# Patient Record
Sex: Female | Born: 1950 | Race: White | Hispanic: No | Smoking: Never smoker
Health system: Southern US, Community
[De-identification: ages and names within clinical notes are randomized; demographics above are authoritative.]

## PROBLEM LIST (undated history)

## (undated) DIAGNOSIS — Z9889 Other specified postprocedural states: Secondary | ICD-10-CM

## (undated) DIAGNOSIS — E041 Nontoxic single thyroid nodule: Secondary | ICD-10-CM

## (undated) DIAGNOSIS — I10 Essential (primary) hypertension: Secondary | ICD-10-CM

## (undated) DIAGNOSIS — M545 Low back pain, unspecified: Secondary | ICD-10-CM

## (undated) DIAGNOSIS — E538 Deficiency of other specified B group vitamins: Secondary | ICD-10-CM

## (undated) DIAGNOSIS — K635 Polyp of colon: Secondary | ICD-10-CM

## (undated) DIAGNOSIS — I2510 Atherosclerotic heart disease of native coronary artery without angina pectoris: Secondary | ICD-10-CM

## (undated) DIAGNOSIS — K7689 Other specified diseases of liver: Secondary | ICD-10-CM

## (undated) DIAGNOSIS — K219 Gastro-esophageal reflux disease without esophagitis: Secondary | ICD-10-CM

## (undated) DIAGNOSIS — IMO0001 Reserved for inherently not codable concepts without codable children: Secondary | ICD-10-CM

## (undated) DIAGNOSIS — J449 Chronic obstructive pulmonary disease, unspecified: Secondary | ICD-10-CM

## (undated) DIAGNOSIS — M1990 Unspecified osteoarthritis, unspecified site: Secondary | ICD-10-CM

## (undated) DIAGNOSIS — I639 Cerebral infarction, unspecified: Secondary | ICD-10-CM

## (undated) DIAGNOSIS — Z860100 Personal history of colon polyps, unspecified: Secondary | ICD-10-CM

## (undated) DIAGNOSIS — M3500 Sicca syndrome, unspecified: Secondary | ICD-10-CM

## (undated) DIAGNOSIS — J42 Unspecified chronic bronchitis: Secondary | ICD-10-CM

## (undated) DIAGNOSIS — Q2730 Arteriovenous malformation, site unspecified: Secondary | ICD-10-CM

## (undated) DIAGNOSIS — R569 Unspecified convulsions: Secondary | ICD-10-CM

## (undated) DIAGNOSIS — D126 Benign neoplasm of colon, unspecified: Secondary | ICD-10-CM

## (undated) DIAGNOSIS — G40909 Epilepsy, unspecified, not intractable, without status epilepticus: Secondary | ICD-10-CM

## (undated) DIAGNOSIS — R112 Nausea with vomiting, unspecified: Secondary | ICD-10-CM

## (undated) DIAGNOSIS — F32A Depression, unspecified: Secondary | ICD-10-CM

## (undated) DIAGNOSIS — G43909 Migraine, unspecified, not intractable, without status migrainosus: Secondary | ICD-10-CM

## (undated) DIAGNOSIS — E785 Hyperlipidemia, unspecified: Secondary | ICD-10-CM

## (undated) DIAGNOSIS — F329 Major depressive disorder, single episode, unspecified: Secondary | ICD-10-CM

## (undated) DIAGNOSIS — K5730 Diverticulosis of large intestine without perforation or abscess without bleeding: Secondary | ICD-10-CM

## (undated) HISTORY — DX: Low back pain, unspecified: M54.50

## (undated) HISTORY — PX: CATARACT EXTRACTION W/ INTRAOCULAR LENS  IMPLANT, BILATERAL: SHX1307

## (undated) HISTORY — DX: Gastro-esophageal reflux disease without esophagitis: K21.9

## (undated) HISTORY — DX: Depression, unspecified: F32.A

## (undated) HISTORY — DX: Migraine, unspecified, not intractable, without status migrainosus: G43.909

## (undated) HISTORY — DX: Epilepsy, unspecified, not intractable, without status epilepticus: G40.909

## (undated) HISTORY — DX: Deficiency of other specified B group vitamins: E53.8

## (undated) HISTORY — PX: LAPAROSCOPIC NISSEN FUNDOPLICATION: SHX1932

## (undated) HISTORY — DX: Hyperlipidemia, unspecified: E78.5

## (undated) HISTORY — DX: Polyp of colon: K63.5

## (undated) HISTORY — PX: CHOLECYSTECTOMY: SHX55

## (undated) HISTORY — DX: Arteriovenous malformation, site unspecified: Q27.30

## (undated) HISTORY — DX: Atherosclerotic heart disease of native coronary artery without angina pectoris: I25.10

## (undated) HISTORY — DX: Diverticulosis of large intestine without perforation or abscess without bleeding: K57.30

## (undated) HISTORY — DX: Unspecified convulsions: R56.9

## (undated) HISTORY — DX: Unspecified chronic bronchitis: J42

## (undated) HISTORY — DX: Benign neoplasm of colon, unspecified: D12.6

## (undated) HISTORY — DX: Personal history of colonic polyps: Z86.010

## (undated) HISTORY — DX: Sicca syndrome, unspecified: M35.00

## (undated) HISTORY — PX: MOUTH SURGERY: SHX715

## (undated) HISTORY — PX: COLONOSCOPY: SHX174

## (undated) HISTORY — DX: Other specified diseases of liver: K76.89

## (undated) HISTORY — PX: HEMORRHOIDECTOMY WITH HEMORRHOID BANDING: SHX5633

## (undated) HISTORY — DX: Cerebral infarction, unspecified: I63.9

## (undated) HISTORY — DX: Personal history of colon polyps, unspecified: Z86.0100

## (undated) HISTORY — PX: ESOPHAGOGASTRODUODENOSCOPY: SHX1529

## (undated) HISTORY — DX: Major depressive disorder, single episode, unspecified: F32.9

## (undated) HISTORY — DX: Low back pain: M54.5

## (undated) HISTORY — DX: Essential (primary) hypertension: I10

## (undated) HISTORY — DX: Unspecified osteoarthritis, unspecified site: M19.90

## (undated) HISTORY — PX: LAPAROSCOPIC OVARIAN CYSTECTOMY: SUR786

---

## 1989-03-15 HISTORY — PX: BRAIN SURGERY: SHX531

## 1991-01-04 DIAGNOSIS — K635 Polyp of colon: Secondary | ICD-10-CM

## 1991-01-04 HISTORY — DX: Polyp of colon: K63.5

## 1997-03-15 HISTORY — PX: NISSEN FUNDOPLICATION: SHX2091

## 1998-01-21 ENCOUNTER — Encounter: Payer: Self-pay | Admitting: General Surgery

## 1998-01-21 ENCOUNTER — Ambulatory Visit (HOSPITAL_COMMUNITY): Admission: RE | Admit: 1998-01-21 | Discharge: 1998-01-21 | Payer: Self-pay | Admitting: General Surgery

## 1998-03-10 ENCOUNTER — Encounter: Payer: Self-pay | Admitting: Gastroenterology

## 1998-03-11 ENCOUNTER — Inpatient Hospital Stay (HOSPITAL_COMMUNITY): Admission: AD | Admit: 1998-03-11 | Discharge: 1998-03-12 | Payer: Self-pay | Admitting: Gastroenterology

## 2000-01-07 ENCOUNTER — Ambulatory Visit (HOSPITAL_COMMUNITY): Admission: RE | Admit: 2000-01-07 | Discharge: 2000-01-07 | Payer: Self-pay | Admitting: Obstetrics and Gynecology

## 2000-01-07 ENCOUNTER — Encounter: Payer: Self-pay | Admitting: Obstetrics and Gynecology

## 2001-01-16 ENCOUNTER — Encounter: Payer: Self-pay | Admitting: Oral Surgery

## 2001-01-16 ENCOUNTER — Ambulatory Visit (HOSPITAL_COMMUNITY): Admission: RE | Admit: 2001-01-16 | Discharge: 2001-01-16 | Payer: Self-pay | Admitting: Oral Surgery

## 2001-02-12 HISTORY — PX: TEMPOROMANDIBULAR JOINT SURGERY: SHX35

## 2001-02-17 ENCOUNTER — Encounter: Payer: Self-pay | Admitting: Oral Surgery

## 2001-02-21 ENCOUNTER — Encounter (INDEPENDENT_AMBULATORY_CARE_PROVIDER_SITE_OTHER): Payer: Self-pay | Admitting: *Deleted

## 2001-02-21 ENCOUNTER — Inpatient Hospital Stay (HOSPITAL_COMMUNITY): Admission: RE | Admit: 2001-02-21 | Discharge: 2001-02-24 | Payer: Self-pay | Admitting: Oral Surgery

## 2001-09-25 ENCOUNTER — Encounter (INDEPENDENT_AMBULATORY_CARE_PROVIDER_SITE_OTHER): Payer: Self-pay | Admitting: Specialist

## 2001-09-25 ENCOUNTER — Inpatient Hospital Stay (HOSPITAL_COMMUNITY): Admission: EM | Admit: 2001-09-25 | Discharge: 2001-10-01 | Payer: Self-pay | Admitting: Internal Medicine

## 2001-09-25 ENCOUNTER — Encounter: Payer: Self-pay | Admitting: Internal Medicine

## 2001-09-26 ENCOUNTER — Encounter: Payer: Self-pay | Admitting: Surgery

## 2001-09-27 ENCOUNTER — Encounter: Payer: Self-pay | Admitting: Surgery

## 2001-11-09 ENCOUNTER — Encounter: Payer: Self-pay | Admitting: Internal Medicine

## 2001-11-09 ENCOUNTER — Ambulatory Visit (HOSPITAL_COMMUNITY): Admission: RE | Admit: 2001-11-09 | Discharge: 2001-11-09 | Payer: Self-pay | Admitting: Internal Medicine

## 2002-01-17 ENCOUNTER — Encounter: Payer: Self-pay | Admitting: Otolaryngology

## 2002-01-17 ENCOUNTER — Ambulatory Visit (HOSPITAL_COMMUNITY): Admission: RE | Admit: 2002-01-17 | Discharge: 2002-01-17 | Payer: Self-pay | Admitting: Otolaryngology

## 2002-09-21 ENCOUNTER — Encounter: Payer: Self-pay | Admitting: Critical Care Medicine

## 2002-09-21 ENCOUNTER — Encounter: Admission: RE | Admit: 2002-09-21 | Discharge: 2002-09-21 | Payer: Self-pay | Admitting: Critical Care Medicine

## 2002-10-29 ENCOUNTER — Ambulatory Visit (HOSPITAL_COMMUNITY): Admission: RE | Admit: 2002-10-29 | Discharge: 2002-10-29 | Payer: Self-pay | Admitting: Internal Medicine

## 2002-10-29 ENCOUNTER — Encounter: Payer: Self-pay | Admitting: Internal Medicine

## 2003-03-05 ENCOUNTER — Encounter: Admission: RE | Admit: 2003-03-05 | Discharge: 2003-03-05 | Payer: Self-pay | Admitting: Internal Medicine

## 2004-01-08 ENCOUNTER — Ambulatory Visit (HOSPITAL_COMMUNITY): Admission: RE | Admit: 2004-01-08 | Discharge: 2004-01-08 | Payer: Self-pay | Admitting: Internal Medicine

## 2004-02-17 ENCOUNTER — Ambulatory Visit: Payer: Self-pay | Admitting: Critical Care Medicine

## 2004-02-19 ENCOUNTER — Ambulatory Visit: Payer: Self-pay | Admitting: Internal Medicine

## 2004-03-18 ENCOUNTER — Ambulatory Visit: Payer: Self-pay | Admitting: Internal Medicine

## 2004-04-24 ENCOUNTER — Ambulatory Visit: Payer: Self-pay | Admitting: Internal Medicine

## 2004-05-01 ENCOUNTER — Ambulatory Visit: Payer: Self-pay | Admitting: Internal Medicine

## 2004-05-28 ENCOUNTER — Ambulatory Visit: Payer: Self-pay | Admitting: Critical Care Medicine

## 2004-07-01 ENCOUNTER — Ambulatory Visit: Payer: Self-pay | Admitting: Internal Medicine

## 2004-07-08 ENCOUNTER — Ambulatory Visit: Payer: Self-pay | Admitting: Internal Medicine

## 2004-07-22 ENCOUNTER — Ambulatory Visit: Payer: Self-pay | Admitting: Internal Medicine

## 2004-09-02 ENCOUNTER — Ambulatory Visit: Payer: Self-pay | Admitting: Internal Medicine

## 2004-10-14 ENCOUNTER — Ambulatory Visit: Payer: Self-pay | Admitting: Internal Medicine

## 2004-12-12 ENCOUNTER — Emergency Department (HOSPITAL_COMMUNITY): Admission: EM | Admit: 2004-12-12 | Discharge: 2004-12-12 | Payer: Self-pay | Admitting: *Deleted

## 2005-01-06 ENCOUNTER — Ambulatory Visit: Payer: Self-pay | Admitting: Internal Medicine

## 2005-01-13 ENCOUNTER — Ambulatory Visit: Payer: Self-pay | Admitting: Internal Medicine

## 2005-02-16 ENCOUNTER — Ambulatory Visit: Payer: Self-pay | Admitting: Internal Medicine

## 2005-03-03 ENCOUNTER — Ambulatory Visit: Payer: Self-pay | Admitting: Internal Medicine

## 2005-04-20 ENCOUNTER — Ambulatory Visit: Payer: Self-pay | Admitting: Internal Medicine

## 2005-05-03 ENCOUNTER — Ambulatory Visit (HOSPITAL_COMMUNITY): Admission: RE | Admit: 2005-05-03 | Discharge: 2005-05-03 | Payer: Self-pay | Admitting: Specialist

## 2005-06-08 ENCOUNTER — Ambulatory Visit (HOSPITAL_COMMUNITY): Admission: RE | Admit: 2005-06-08 | Discharge: 2005-06-08 | Payer: Self-pay | Admitting: Specialist

## 2005-07-21 ENCOUNTER — Ambulatory Visit: Payer: Self-pay | Admitting: Internal Medicine

## 2005-08-03 ENCOUNTER — Other Ambulatory Visit: Admission: RE | Admit: 2005-08-03 | Discharge: 2005-08-03 | Payer: Self-pay | Admitting: Obstetrics and Gynecology

## 2005-08-20 ENCOUNTER — Encounter (INDEPENDENT_AMBULATORY_CARE_PROVIDER_SITE_OTHER): Payer: Self-pay | Admitting: Specialist

## 2005-08-20 ENCOUNTER — Encounter: Admission: RE | Admit: 2005-08-20 | Discharge: 2005-08-20 | Payer: Self-pay | Admitting: Obstetrics and Gynecology

## 2005-09-17 ENCOUNTER — Ambulatory Visit: Payer: Self-pay | Admitting: Internal Medicine

## 2005-09-29 ENCOUNTER — Ambulatory Visit: Payer: Self-pay | Admitting: Internal Medicine

## 2005-10-28 ENCOUNTER — Encounter: Admission: RE | Admit: 2005-10-28 | Discharge: 2005-10-28 | Payer: Self-pay | Admitting: Obstetrics and Gynecology

## 2005-11-02 ENCOUNTER — Ambulatory Visit: Payer: Self-pay | Admitting: Internal Medicine

## 2005-12-10 ENCOUNTER — Ambulatory Visit (HOSPITAL_COMMUNITY): Admission: RE | Admit: 2005-12-10 | Discharge: 2005-12-10 | Payer: Self-pay | Admitting: General Surgery

## 2005-12-10 ENCOUNTER — Encounter (INDEPENDENT_AMBULATORY_CARE_PROVIDER_SITE_OTHER): Payer: Self-pay | Admitting: Specialist

## 2006-01-04 ENCOUNTER — Ambulatory Visit: Payer: Self-pay | Admitting: Internal Medicine

## 2006-01-04 LAB — CONVERTED CEMR LAB
Free T4: 0.7 ng/dL — ABNORMAL LOW (ref 0.9–1.8)
TSH: 1.91 microintl units/mL (ref 0.35–5.50)
Vitamin B-12: 1110 pg/mL — ABNORMAL HIGH (ref 211–911)

## 2006-01-31 ENCOUNTER — Ambulatory Visit: Payer: Self-pay | Admitting: Internal Medicine

## 2006-02-09 ENCOUNTER — Ambulatory Visit: Payer: Self-pay | Admitting: Internal Medicine

## 2006-02-17 ENCOUNTER — Encounter (INDEPENDENT_AMBULATORY_CARE_PROVIDER_SITE_OTHER): Payer: Self-pay | Admitting: Specialist

## 2006-02-17 ENCOUNTER — Ambulatory Visit: Payer: Self-pay | Admitting: Internal Medicine

## 2006-02-17 DIAGNOSIS — K5730 Diverticulosis of large intestine without perforation or abscess without bleeding: Secondary | ICD-10-CM

## 2006-02-17 HISTORY — DX: Diverticulosis of large intestine without perforation or abscess without bleeding: K57.30

## 2006-05-05 ENCOUNTER — Ambulatory Visit: Payer: Self-pay | Admitting: Internal Medicine

## 2006-05-05 LAB — CONVERTED CEMR LAB
Creatinine, Ser: 0.6 mg/dL (ref 0.4–1.2)
Potassium: 4.3 meq/L (ref 3.5–5.1)
TSH: 2.77 microintl units/mL (ref 0.35–5.50)
Vit D, 1,25-Dihydroxy: 13 — ABNORMAL LOW (ref 20–57)
Vitamin B-12: 733 pg/mL (ref 211–911)

## 2006-05-11 ENCOUNTER — Ambulatory Visit: Payer: Self-pay | Admitting: Internal Medicine

## 2006-08-04 ENCOUNTER — Ambulatory Visit: Payer: Self-pay | Admitting: Internal Medicine

## 2006-08-04 LAB — CONVERTED CEMR LAB
BUN: 5 mg/dL — ABNORMAL LOW (ref 6–23)
CO2: 33 meq/L — ABNORMAL HIGH (ref 19–32)
Calcium: 9.2 mg/dL (ref 8.4–10.5)
Chloride: 102 meq/L (ref 96–112)
Creatinine, Ser: 0.6 mg/dL (ref 0.4–1.2)
GFR calc Af Amer: 133 mL/min
GFR calc non Af Amer: 110 mL/min
Glucose, Bld: 86 mg/dL (ref 70–99)
Potassium: 4.3 meq/L (ref 3.5–5.1)
Sodium: 140 meq/L (ref 135–145)
Vit D, 1,25-Dihydroxy: 15 — ABNORMAL LOW (ref 20–57)
Vitamin B-12: 983 pg/mL — ABNORMAL HIGH (ref 211–911)

## 2006-08-10 ENCOUNTER — Ambulatory Visit: Payer: Self-pay | Admitting: Internal Medicine

## 2006-10-05 DIAGNOSIS — E785 Hyperlipidemia, unspecified: Secondary | ICD-10-CM

## 2006-10-05 DIAGNOSIS — K219 Gastro-esophageal reflux disease without esophagitis: Secondary | ICD-10-CM | POA: Insufficient documentation

## 2006-10-05 DIAGNOSIS — K5730 Diverticulosis of large intestine without perforation or abscess without bleeding: Secondary | ICD-10-CM | POA: Insufficient documentation

## 2006-10-05 DIAGNOSIS — I10 Essential (primary) hypertension: Secondary | ICD-10-CM | POA: Insufficient documentation

## 2006-10-05 DIAGNOSIS — R569 Unspecified convulsions: Secondary | ICD-10-CM

## 2006-10-26 ENCOUNTER — Encounter: Admission: RE | Admit: 2006-10-26 | Discharge: 2006-10-26 | Payer: Self-pay | Admitting: Internal Medicine

## 2006-11-02 ENCOUNTER — Ambulatory Visit: Payer: Self-pay | Admitting: Internal Medicine

## 2006-11-02 LAB — CONVERTED CEMR LAB: Vit D, 1,25-Dihydroxy: 41 (ref 20–57)

## 2006-11-09 ENCOUNTER — Ambulatory Visit: Payer: Self-pay | Admitting: Internal Medicine

## 2006-12-06 ENCOUNTER — Ambulatory Visit: Payer: Self-pay | Admitting: Internal Medicine

## 2006-12-06 LAB — CONVERTED CEMR LAB
Ketones, ur: NEGATIVE mg/dL
Leukocytes, UA: NEGATIVE
Nitrite: NEGATIVE
Specific Gravity, Urine: 1.025 (ref 1.000–1.03)
Total Protein, Urine: NEGATIVE mg/dL
Urine Glucose: NEGATIVE mg/dL
Urobilinogen, UA: 0.2 (ref 0.0–1.0)
pH: 6 (ref 5.0–8.0)

## 2006-12-13 ENCOUNTER — Ambulatory Visit: Payer: Self-pay | Admitting: Internal Medicine

## 2006-12-13 DIAGNOSIS — R21 Rash and other nonspecific skin eruption: Secondary | ICD-10-CM

## 2006-12-13 DIAGNOSIS — L509 Urticaria, unspecified: Secondary | ICD-10-CM | POA: Insufficient documentation

## 2006-12-13 DIAGNOSIS — L298 Other pruritus: Secondary | ICD-10-CM

## 2006-12-13 DIAGNOSIS — T50905S Adverse effect of unspecified drugs, medicaments and biological substances, sequela: Secondary | ICD-10-CM | POA: Insufficient documentation

## 2006-12-16 ENCOUNTER — Encounter: Payer: Self-pay | Admitting: Internal Medicine

## 2007-02-20 ENCOUNTER — Telehealth: Payer: Self-pay | Admitting: Internal Medicine

## 2007-03-07 ENCOUNTER — Ambulatory Visit: Payer: Self-pay | Admitting: Internal Medicine

## 2007-03-07 DIAGNOSIS — J4489 Other specified chronic obstructive pulmonary disease: Secondary | ICD-10-CM | POA: Insufficient documentation

## 2007-03-07 DIAGNOSIS — J45909 Unspecified asthma, uncomplicated: Secondary | ICD-10-CM

## 2007-03-07 DIAGNOSIS — E538 Deficiency of other specified B group vitamins: Secondary | ICD-10-CM

## 2007-03-07 DIAGNOSIS — B370 Candidal stomatitis: Secondary | ICD-10-CM

## 2007-03-07 DIAGNOSIS — M545 Low back pain: Secondary | ICD-10-CM

## 2007-03-07 DIAGNOSIS — M1990 Unspecified osteoarthritis, unspecified site: Secondary | ICD-10-CM

## 2007-03-29 ENCOUNTER — Ambulatory Visit: Payer: Self-pay | Admitting: Internal Medicine

## 2007-03-29 DIAGNOSIS — R197 Diarrhea, unspecified: Secondary | ICD-10-CM

## 2007-04-04 ENCOUNTER — Telehealth: Payer: Self-pay | Admitting: Internal Medicine

## 2007-04-13 ENCOUNTER — Encounter: Payer: Self-pay | Admitting: Internal Medicine

## 2007-04-28 ENCOUNTER — Encounter: Payer: Self-pay | Admitting: Internal Medicine

## 2007-06-01 ENCOUNTER — Ambulatory Visit: Payer: Self-pay | Admitting: Internal Medicine

## 2007-06-01 DIAGNOSIS — T887XXA Unspecified adverse effect of drug or medicament, initial encounter: Secondary | ICD-10-CM | POA: Insufficient documentation

## 2007-06-04 LAB — CONVERTED CEMR LAB
BUN: 6 mg/dL (ref 6–23)
CO2: 33 meq/L — ABNORMAL HIGH (ref 19–32)
Calcium: 9.4 mg/dL (ref 8.4–10.5)
Chloride: 101 meq/L (ref 96–112)
Creatinine, Ser: 0.6 mg/dL (ref 0.4–1.2)
GFR calc Af Amer: 132 mL/min
GFR calc non Af Amer: 109 mL/min
Glucose, Bld: 54 mg/dL — ABNORMAL LOW (ref 70–99)
Potassium: 3.9 meq/L (ref 3.5–5.1)
Sodium: 140 meq/L (ref 135–145)
TSH: 1.94 microintl units/mL (ref 0.35–5.50)

## 2007-06-07 ENCOUNTER — Ambulatory Visit: Payer: Self-pay | Admitting: Internal Medicine

## 2007-06-07 DIAGNOSIS — E162 Hypoglycemia, unspecified: Secondary | ICD-10-CM

## 2007-06-16 ENCOUNTER — Telehealth: Payer: Self-pay | Admitting: Internal Medicine

## 2007-06-16 ENCOUNTER — Ambulatory Visit: Payer: Self-pay | Admitting: Internal Medicine

## 2007-06-19 LAB — CONVERTED CEMR LAB
Glucose, 1 Hour GTT: 165 mg/dL (ref 120–170)
Glucose, 2 hour: 125 mg/dL (ref 70–139)
Glucose, Fasting: 87 mg/dL (ref 70–99)

## 2007-09-07 ENCOUNTER — Telehealth: Payer: Self-pay | Admitting: Internal Medicine

## 2007-09-11 ENCOUNTER — Ambulatory Visit: Payer: Self-pay | Admitting: Endocrinology

## 2007-09-11 DIAGNOSIS — J069 Acute upper respiratory infection, unspecified: Secondary | ICD-10-CM

## 2007-09-20 ENCOUNTER — Ambulatory Visit: Payer: Self-pay | Admitting: Internal Medicine

## 2007-12-07 ENCOUNTER — Encounter: Admission: RE | Admit: 2007-12-07 | Discharge: 2007-12-07 | Payer: Self-pay | Admitting: Internal Medicine

## 2007-12-19 ENCOUNTER — Ambulatory Visit: Payer: Self-pay | Admitting: Internal Medicine

## 2007-12-20 LAB — CONVERTED CEMR LAB
ALT: 11 units/L (ref 0–35)
AST: 12 units/L (ref 0–37)
Albumin: 4 g/dL (ref 3.5–5.2)
Alkaline Phosphatase: 100 units/L (ref 39–117)
BUN: 8 mg/dL (ref 6–23)
Basophils Absolute: 0.1 10*3/uL (ref 0.0–0.1)
Basophils Relative: 1.3 % (ref 0.0–3.0)
Bilirubin Urine: NEGATIVE
Bilirubin, Direct: 0.2 mg/dL (ref 0.0–0.3)
CO2: 33 meq/L — ABNORMAL HIGH (ref 19–32)
Calcium: 9.5 mg/dL (ref 8.4–10.5)
Chloride: 102 meq/L (ref 96–112)
Cholesterol: 236 mg/dL (ref 0–200)
Creatinine, Ser: 0.7 mg/dL (ref 0.4–1.2)
Direct LDL: 149.2 mg/dL
Eosinophils Absolute: 0.1 10*3/uL (ref 0.0–0.7)
Eosinophils Relative: 1.5 % (ref 0.0–5.0)
GFR calc Af Amer: 111 mL/min
GFR calc non Af Amer: 91 mL/min
Glucose, Bld: 96 mg/dL (ref 70–99)
HCT: 41.5 % (ref 36.0–46.0)
HDL: 57.2 mg/dL (ref >39.0)
Hemoglobin, Urine: NEGATIVE
Hemoglobin: 14.5 g/dL (ref 12.0–15.0)
Ketones, ur: NEGATIVE mg/dL
Leukocytes, UA: NEGATIVE
Lymphocytes Relative: 31.5 % (ref 12.0–46.0)
MCHC: 34.9 g/dL (ref 30.0–36.0)
MCV: 87.1 fL (ref 78.0–100.0)
Monocytes Absolute: 0.7 10*3/uL (ref 0.1–1.0)
Monocytes Relative: 10.9 % (ref 3.0–12.0)
Neutro Abs: 3.6 10*3/uL (ref 1.4–7.7)
Neutrophils Relative %: 54.8 % (ref 43.0–77.0)
Nitrite: NEGATIVE
Platelets: 277 10*3/uL (ref 150–400)
Potassium: 5.1 meq/L (ref 3.5–5.1)
RBC: 4.77 M/uL (ref 3.87–5.11)
RDW: 12.9 % (ref 11.5–14.6)
Sodium: 141 meq/L (ref 135–145)
Specific Gravity, Urine: 1.02 (ref 1.000–1.03)
TSH: 2.16 microintl units/mL (ref 0.35–5.50)
Total Bilirubin: 0.7 mg/dL (ref 0.3–1.2)
Total CHOL/HDL Ratio: 4.1
Total Protein, Urine: NEGATIVE mg/dL
Total Protein: 6.9 g/dL (ref 6.0–8.3)
Triglycerides: 90 mg/dL (ref 0–149)
Urine Glucose: NEGATIVE mg/dL
Urobilinogen, UA: 0.2 (ref 0.0–1.0)
VLDL: 18 mg/dL (ref 0–40)
WBC: 6.6 10*3/uL (ref 4.5–10.5)
pH: 6.5 (ref 5.0–8.0)

## 2007-12-27 ENCOUNTER — Ambulatory Visit: Payer: Self-pay | Admitting: Internal Medicine

## 2008-03-15 DIAGNOSIS — M3500 Sicca syndrome, unspecified: Secondary | ICD-10-CM

## 2008-03-15 HISTORY — DX: Sjogren syndrome, unspecified: M35.00

## 2008-03-21 ENCOUNTER — Ambulatory Visit: Payer: Self-pay | Admitting: Internal Medicine

## 2008-03-21 ENCOUNTER — Encounter: Payer: Self-pay | Admitting: Internal Medicine

## 2008-03-21 DIAGNOSIS — R05 Cough: Secondary | ICD-10-CM

## 2008-03-21 DIAGNOSIS — R059 Cough, unspecified: Secondary | ICD-10-CM | POA: Insufficient documentation

## 2008-03-21 LAB — CONVERTED CEMR LAB
ALT: 8 units/L (ref 0–35)
AST: 16 units/L (ref 0–37)
Albumin: 4 g/dL (ref 3.5–5.2)
Alkaline Phosphatase: 98 units/L (ref 39–117)
BUN: 7 mg/dL (ref 6–23)
Basophils Absolute: 0 10*3/uL (ref 0.0–0.1)
Basophils Relative: 0.5 % (ref 0.0–3.0)
Bilirubin, Direct: 0.1 mg/dL (ref 0.0–0.3)
CO2: 32 meq/L (ref 19–32)
Calcium: 9.6 mg/dL (ref 8.4–10.5)
Chloride: 101 meq/L (ref 96–112)
Creatinine, Ser: 0.6 mg/dL (ref 0.4–1.2)
Eosinophils Absolute: 0.3 10*3/uL (ref 0.0–0.7)
Eosinophils Relative: 4.4 % (ref 0.0–5.0)
GFR calc Af Amer: 132 mL/min
GFR calc non Af Amer: 109 mL/min
Glucose, Bld: 131 mg/dL — ABNORMAL HIGH (ref 70–99)
HCT: 40.3 % (ref 36.0–46.0)
Hemoglobin: 13.6 g/dL (ref 12.0–15.0)
Lymphocytes Relative: 27.6 % (ref 12.0–46.0)
MCHC: 33.8 g/dL (ref 30.0–36.0)
MCV: 88.1 fL (ref 78.0–100.0)
Monocytes Absolute: 0.7 10*3/uL (ref 0.1–1.0)
Monocytes Relative: 11.8 % (ref 3.0–12.0)
Neutro Abs: 3.6 10*3/uL (ref 1.4–7.7)
Neutrophils Relative %: 55.7 % (ref 43.0–77.0)
Platelets: 277 10*3/uL (ref 150–400)
Potassium: 4.2 meq/L (ref 3.5–5.1)
RBC: 4.57 M/uL (ref 3.87–5.11)
RDW: 13 % (ref 11.5–14.6)
Sodium: 140 meq/L (ref 135–145)
TSH: 2.12 microintl units/mL (ref 0.35–5.50)
Total Bilirubin: 0.5 mg/dL (ref 0.3–1.2)
Total Protein: 7 g/dL (ref 6.0–8.3)
WBC: 6.3 10*3/uL (ref 4.5–10.5)

## 2008-03-28 ENCOUNTER — Ambulatory Visit: Payer: Self-pay | Admitting: Internal Medicine

## 2008-06-07 ENCOUNTER — Encounter: Payer: Self-pay | Admitting: Internal Medicine

## 2008-06-20 ENCOUNTER — Encounter: Payer: Self-pay | Admitting: Internal Medicine

## 2008-06-21 ENCOUNTER — Ambulatory Visit: Payer: Self-pay | Admitting: Internal Medicine

## 2008-06-21 LAB — CONVERTED CEMR LAB
ALT: 9 units/L (ref 0–35)
AST: 12 units/L (ref 0–37)
Albumin: 3.8 g/dL (ref 3.5–5.2)
Alkaline Phosphatase: 100 units/L (ref 39–117)
BUN: 9 mg/dL (ref 6–23)
Bilirubin, Direct: 0.1 mg/dL (ref 0.0–0.3)
CO2: 33 meq/L — ABNORMAL HIGH (ref 19–32)
Calcium: 9.2 mg/dL (ref 8.4–10.5)
Chloride: 104 meq/L (ref 96–112)
Creatinine, Ser: 0.6 mg/dL (ref 0.4–1.2)
GFR calc non Af Amer: 108.66 mL/min (ref >60)
Glucose, Bld: 89 mg/dL (ref 70–99)
Potassium: 4.2 meq/L (ref 3.5–5.1)
Sodium: 142 meq/L (ref 135–145)
Total Bilirubin: 0.7 mg/dL (ref 0.3–1.2)
Total Protein: 6.7 g/dL (ref 6.0–8.3)
Vitamin B-12: 526 pg/mL (ref 211–911)

## 2008-06-25 LAB — CONVERTED CEMR LAB: Vit D, 25-Hydroxy: 26 ng/mL — ABNORMAL LOW (ref 30–89)

## 2008-06-27 ENCOUNTER — Ambulatory Visit: Payer: Self-pay | Admitting: Internal Medicine

## 2008-06-27 DIAGNOSIS — E559 Vitamin D deficiency, unspecified: Secondary | ICD-10-CM

## 2008-07-08 ENCOUNTER — Telehealth: Payer: Self-pay | Admitting: Internal Medicine

## 2008-07-17 ENCOUNTER — Telehealth (INDEPENDENT_AMBULATORY_CARE_PROVIDER_SITE_OTHER): Payer: Self-pay | Admitting: *Deleted

## 2008-07-18 ENCOUNTER — Telehealth: Payer: Self-pay | Admitting: Internal Medicine

## 2008-09-17 ENCOUNTER — Ambulatory Visit: Payer: Self-pay | Admitting: Internal Medicine

## 2008-09-17 LAB — CONVERTED CEMR LAB
ALT: 10 units/L (ref 0–35)
AST: 16 units/L (ref 0–37)
Albumin: 3.8 g/dL (ref 3.5–5.2)
Alkaline Phosphatase: 88 units/L (ref 39–117)
BUN: 9 mg/dL (ref 6–23)
Bilirubin, Direct: 0.1 mg/dL (ref 0.0–0.3)
CO2: 33 meq/L — ABNORMAL HIGH (ref 19–32)
Calcium: 9.1 mg/dL (ref 8.4–10.5)
Chloride: 102 meq/L (ref 96–112)
Creatinine, Ser: 0.8 mg/dL (ref 0.4–1.2)
GFR calc non Af Amer: 77.9 mL/min (ref >60)
Glucose, Bld: 94 mg/dL (ref 70–99)
Potassium: 4.4 meq/L (ref 3.5–5.1)
Sodium: 142 meq/L (ref 135–145)
TSH: 1.43 microintl units/mL (ref 0.35–5.50)
Total Bilirubin: 0.7 mg/dL (ref 0.3–1.2)
Total Protein: 6.7 g/dL (ref 6.0–8.3)
Vit D, 25-Hydroxy: 24 ng/mL — ABNORMAL LOW (ref 30–89)

## 2008-09-24 ENCOUNTER — Ambulatory Visit: Payer: Self-pay | Admitting: Internal Medicine

## 2008-12-24 ENCOUNTER — Ambulatory Visit: Payer: Self-pay | Admitting: Internal Medicine

## 2008-12-24 DIAGNOSIS — J42 Unspecified chronic bronchitis: Secondary | ICD-10-CM

## 2008-12-24 DIAGNOSIS — M3500 Sicca syndrome, unspecified: Secondary | ICD-10-CM | POA: Insufficient documentation

## 2008-12-24 DIAGNOSIS — K047 Periapical abscess without sinus: Secondary | ICD-10-CM | POA: Insufficient documentation

## 2008-12-27 ENCOUNTER — Telehealth: Payer: Self-pay | Admitting: Internal Medicine

## 2009-01-17 ENCOUNTER — Ambulatory Visit: Payer: Self-pay | Admitting: Internal Medicine

## 2009-01-17 DIAGNOSIS — R1013 Epigastric pain: Secondary | ICD-10-CM | POA: Insufficient documentation

## 2009-01-24 ENCOUNTER — Encounter: Admission: RE | Admit: 2009-01-24 | Discharge: 2009-01-24 | Payer: Self-pay | Admitting: Internal Medicine

## 2009-01-24 ENCOUNTER — Ambulatory Visit: Payer: Self-pay | Admitting: Internal Medicine

## 2009-01-27 LAB — CONVERTED CEMR LAB
ALT: 11 units/L (ref 0–35)
AST: 14 units/L (ref 0–37)
Albumin: 4.3 g/dL (ref 3.5–5.2)
Alkaline Phosphatase: 93 units/L (ref 39–117)
BUN: 3 mg/dL — ABNORMAL LOW (ref 6–23)
Bilirubin, Direct: 0.1 mg/dL (ref 0.0–0.3)
CO2: 29 meq/L (ref 19–32)
Calcium: 9.3 mg/dL (ref 8.4–10.5)
Chloride: 104 meq/L (ref 96–112)
Creatinine, Ser: 0.6 mg/dL (ref 0.4–1.2)
GFR calc non Af Amer: 108.44 mL/min (ref >60)
Glucose, Bld: 87 mg/dL (ref 70–99)
Potassium: 4.5 meq/L (ref 3.5–5.1)
Sodium: 143 meq/L (ref 135–145)
TSH: 1.36 microintl units/mL (ref 0.35–5.50)
Total Bilirubin: 0.6 mg/dL (ref 0.3–1.2)
Total Protein: 7.1 g/dL (ref 6.0–8.3)

## 2009-01-29 ENCOUNTER — Ambulatory Visit: Payer: Self-pay | Admitting: Internal Medicine

## 2009-02-04 ENCOUNTER — Encounter: Payer: Self-pay | Admitting: Internal Medicine

## 2009-02-12 ENCOUNTER — Encounter: Admission: RE | Admit: 2009-02-12 | Discharge: 2009-02-12 | Payer: Self-pay | Admitting: Internal Medicine

## 2009-06-13 ENCOUNTER — Ambulatory Visit: Payer: Self-pay | Admitting: Internal Medicine

## 2009-06-13 DIAGNOSIS — F4321 Adjustment disorder with depressed mood: Secondary | ICD-10-CM

## 2009-07-03 ENCOUNTER — Encounter: Payer: Self-pay | Admitting: Internal Medicine

## 2009-09-26 ENCOUNTER — Ambulatory Visit: Payer: Self-pay | Admitting: Internal Medicine

## 2009-09-29 LAB — CONVERTED CEMR LAB
ALT: 11 units/L (ref 0–35)
AST: 15 units/L (ref 0–37)
Albumin: 4 g/dL (ref 3.5–5.2)
Alkaline Phosphatase: 78 units/L (ref 39–117)
BUN: 9 mg/dL (ref 6–23)
Basophils Absolute: 0 10*3/uL (ref 0.0–0.1)
Basophils Relative: 1 % (ref 0.0–3.0)
Bilirubin, Direct: 0.1 mg/dL (ref 0.0–0.3)
CO2: 33 meq/L — ABNORMAL HIGH (ref 19–32)
Calcium: 9.6 mg/dL (ref 8.4–10.5)
Chloride: 109 meq/L (ref 96–112)
Cholesterol: 257 mg/dL — ABNORMAL HIGH (ref 0–200)
Creatinine, Ser: 0.7 mg/dL (ref 0.4–1.2)
Direct LDL: 165 mg/dL
Eosinophils Absolute: 0.2 10*3/uL (ref 0.0–0.7)
Eosinophils Relative: 3.4 % (ref 0.0–5.0)
GFR calc non Af Amer: 98.65 mL/min (ref >60)
Glucose, Bld: 82 mg/dL (ref 70–99)
HCT: 40.3 % (ref 36.0–46.0)
HDL: 71.4 mg/dL (ref >39.00)
Hemoglobin: 13.6 g/dL (ref 12.0–15.0)
Lymphocytes Relative: 33 % (ref 12.0–46.0)
Lymphs Abs: 1.7 10*3/uL (ref 0.7–4.0)
MCHC: 33.8 g/dL (ref 30.0–36.0)
MCV: 88.9 fL (ref 78.0–100.0)
Monocytes Absolute: 0.7 10*3/uL (ref 0.1–1.0)
Monocytes Relative: 14.3 % — ABNORMAL HIGH (ref 3.0–12.0)
Neutro Abs: 2.5 10*3/uL (ref 1.4–7.7)
Neutrophils Relative %: 48.3 % (ref 43.0–77.0)
Phenytoin Lvl: 23.5 ug/mL — ABNORMAL HIGH (ref 10.0–20.0)
Platelets: 278 10*3/uL (ref 150.0–400.0)
Potassium: 5.7 meq/L — ABNORMAL HIGH (ref 3.5–5.1)
RBC: 4.53 M/uL (ref 3.87–5.11)
RDW: 14.6 % (ref 11.5–14.6)
Sodium: 142 meq/L (ref 135–145)
TSH: 2.07 microintl units/mL (ref 0.35–5.50)
Total Bilirubin: 0.3 mg/dL (ref 0.3–1.2)
Total CHOL/HDL Ratio: 4
Total Protein: 6.7 g/dL (ref 6.0–8.3)
Triglycerides: 73 mg/dL (ref 0.0–149.0)
VLDL: 14.6 mg/dL (ref 0.0–40.0)
WBC: 5.1 10*3/uL (ref 4.5–10.5)

## 2009-10-03 ENCOUNTER — Ambulatory Visit: Payer: Self-pay | Admitting: Internal Medicine

## 2009-10-03 DIAGNOSIS — T148XXA Other injury of unspecified body region, initial encounter: Secondary | ICD-10-CM | POA: Insufficient documentation

## 2009-10-03 DIAGNOSIS — R509 Fever, unspecified: Secondary | ICD-10-CM | POA: Insufficient documentation

## 2009-10-06 ENCOUNTER — Telehealth: Payer: Self-pay | Admitting: Internal Medicine

## 2009-10-06 LAB — CONVERTED CEMR LAB
Bilirubin Urine: NEGATIVE
Hemoglobin, Urine: NEGATIVE
Ketones, ur: NEGATIVE mg/dL
Leukocytes, UA: NEGATIVE
Nitrite: NEGATIVE
Specific Gravity, Urine: 1.02 (ref 1.000–1.030)
Total Protein, Urine: NEGATIVE mg/dL
Urine Glucose: NEGATIVE mg/dL
Urobilinogen, UA: 0.2 (ref 0.0–1.0)
pH: 5.5 (ref 5.0–8.0)

## 2009-11-07 ENCOUNTER — Ambulatory Visit: Payer: Self-pay | Admitting: Internal Medicine

## 2009-11-07 LAB — CONVERTED CEMR LAB
Basophils Absolute: 0.1 10*3/uL (ref 0.0–0.1)
Basophils Relative: 1.2 % (ref 0.0–3.0)
Chloride: 106 meq/L (ref 96–112)
Eosinophils Relative: 3.2 % (ref 0.0–5.0)
GFR calc non Af Amer: 89.06 mL/min (ref >60)
HCT: 40.8 % (ref 36.0–46.0)
Hemoglobin: 13.9 g/dL (ref 12.0–15.0)
Lymphocytes Relative: 33.4 % (ref 12.0–46.0)
Lymphs Abs: 1.9 10*3/uL (ref 0.7–4.0)
Monocytes Relative: 12.5 % — ABNORMAL HIGH (ref 3.0–12.0)
Neutro Abs: 2.8 10*3/uL (ref 1.4–7.7)
Phenytoin Lvl: 2.3 ug/mL — ABNORMAL LOW (ref 10.0–20.0)
Potassium: 5.7 meq/L — ABNORMAL HIGH (ref 3.5–5.1)
RBC: 4.63 M/uL (ref 3.87–5.11)
Sodium: 144 meq/L (ref 135–145)
WBC: 5.6 10*3/uL (ref 4.5–10.5)

## 2009-11-14 ENCOUNTER — Ambulatory Visit: Payer: Self-pay | Admitting: Internal Medicine

## 2009-11-14 DIAGNOSIS — E875 Hyperkalemia: Secondary | ICD-10-CM | POA: Insufficient documentation

## 2009-12-15 ENCOUNTER — Encounter: Payer: Self-pay | Admitting: Internal Medicine

## 2010-01-05 ENCOUNTER — Ambulatory Visit: Payer: Self-pay | Admitting: Internal Medicine

## 2010-01-05 LAB — CONVERTED CEMR LAB
Chloride: 100 meq/L (ref 96–112)
GFR calc non Af Amer: 110.21 mL/min (ref >60)
Glucose, Bld: 73 mg/dL (ref 70–99)
Potassium: 5.2 meq/L — ABNORMAL HIGH (ref 3.5–5.1)
Sodium: 138 meq/L (ref 135–145)

## 2010-01-09 ENCOUNTER — Ambulatory Visit: Payer: Self-pay | Admitting: Internal Medicine

## 2010-02-20 ENCOUNTER — Encounter
Admission: RE | Admit: 2010-02-20 | Discharge: 2010-02-20 | Payer: Self-pay | Source: Home / Self Care | Attending: Internal Medicine | Admitting: Internal Medicine

## 2010-02-20 LAB — HM MAMMOGRAPHY

## 2010-04-05 ENCOUNTER — Encounter: Payer: Self-pay | Admitting: Internal Medicine

## 2010-04-14 NOTE — Progress Notes (Signed)
Summary: PRAVACHOL AND PRAVASTATIN  Phone Note Refill Request   Refills Requested: Medication #1:  PRAVACHOL 40 MG  TABS 1 by mouth two times a day [BMN]  pt wants to switch to Pravastatin 80 mg for insurance purposes  Initial call taken by: Ami Bullins CMA,  October 06, 2009 11:33 AM  Follow-up for Phone Call        HER INSURANCE WON'T COVER THE [place].  IT WOULD COST 385.00 PER MONTH.  Cierrah SAID SHE WILL TRY THE GENERIC, WHICH WOULD COST AROUND 5.00 A MONTH FOR 80 MG ( SO SHE CAN TAKE 1/2 IN AM AND 1/2 IN PM) Follow-up by: Hilarie Fredrickson,  October 06, 2009 1:08 PM  Additional Follow-up for Phone Call Additional follow up Details #1::        OK Additional Follow-up by: Tresa Garter MD,  October 06, 2009 9:52 PM    Additional Follow-up for Phone Call Additional follow up Details #2::    Pt informed  Follow-up by: Lamar Sprinkles, CMA,  October 07, 2009 9:23 AM  New/Updated Medications: PRAVACHOL 80 MG TABS (PRAVASTATIN SODIUM) 1 by mouth qd Prescriptions: PRAVACHOL 80 MG TABS (PRAVASTATIN SODIUM) 1 by mouth qd  #30 x 12   Entered and Authorized by:   Tresa Garter MD   Signed by:   Lamar Sprinkles, CMA on 10/07/2009   Method used:   [facility]        [facility] [phone]* (retail)       [address]       [place], [place]  [address]       Ph: [phone] or [phone]       Fax: [phone]   RxID:   [id number]

## 2010-04-14 NOTE — Letter (Signed)
Summary: [facility]   Imported By: Sherian Rein 12/23/2009 14:03:49  _____________________________________________________________________  External Attachment:    Type:   Image     Comment:   External Document

## 2010-04-14 NOTE — Progress Notes (Signed)
Summary: PRAVACOL REFILL  Phone Note Call from Patient Call back at [facility] Phone [phone]   Caller: Patient Summary of Call: [facility] WAS SEEN JULY 22.  SHE NEEDS HER [facility]AV[facility]OL [facility] [place] ([phone]).  SHE WANTS 30 DAY SUPPLY WITH 12 REFILLS. Initial call taken by: Hilarie Fredrickson,  October 06, 2009 8:13 AM  Follow-up for Phone Call        pt informed  Follow-up by: Lanier Prude, [facility]),  October 06, 2009 10:58 AM    Prescriptions: PRAVACHOL 40 MG  TABS (PRAVASTATIN SODIUM) 1 by mouth two times a day Brand medically necessary #30 x 11   Entered by:   Lanier Prude, CMA(AAMA)   Authorized by:   Tresa Garter MD   Signed by:   Lanier Prude, [facility]) on 10/06/2009   Method used:   Electronically to        [facility] [phone]* (retail)       [address]       [place], [place]  [address]       Ph: [phone] or [phone]       Fax: [phone]   RxID:   [id number]  Error:  # 60 was given verbally to pharmacy.

## 2010-04-14 NOTE — Assessment & Plan Note (Signed)
Summary: FU/NWS   Vital Signs:  Patient profile:   60 year old female Height:      63 inches Weight:      101 pounds BMI:     17.96 Temp:     98.6 degrees F Pulse rate:   89 / minute BP sitting:   124 / 76  (left arm)  Vitals Entered By: Tora Perches (June 13, 2009 9:59 AM) CC: f/u Is Patient Diabetic? Yes   CC:  f/u.  History of Present Illness: The patient presents for a follow up of back pain, anxiety, depression and headaches. Sad - grieving.  Preventive Screening-Counseling & Management  Alcohol-Tobacco     Smoking Status: never  Current Medications (verified): 1)  Nexium 40 Mg  Pack (Esomeprazole Magnesium) .[name].. 1 By Mouth Two Times A Day 2)  Pravachol 40 Mg  Tabs (Pravastatin Sodium) .[name].. 1 By Mouth Two Times A Day 3)  Dilantin 100 Mg  Caps (Phenytoin Sodium Extended) .... 2 in Am and 1 in Pm 4)  Klonopin 1 Mg  Tabs (Clonazepam) .... 2   Qhs 5)  Flexeril 10 Mg  Tabs (Cyclobenzaprine Hcl) .[name].. 1 By Mouth Tid 6)  Xopenex 1.25 Mg/34ml  Nebu (Levalbuterol Hcl) .[name].. 1 By Mouth Three Times A Day 7)  Pulmicort 0.25 Mg/24ml  Susp (Budesonide (Inhalation)) .... Three Times A Day 8)  Pancrease Mt 20 56-20-44 Mu  Cpep (Amylase-Lipase-Protease) .... Three Times A Day As Needed 9)  Cyproheptadine Hcl 4 Mg  Tabs (Cyproheptadine Hcl) .[name].. 1 By Mouth Once Daily 10)  Combivent 103-18 Mcg/act  Aero (Albuterol-Ipratropium) .... 2 Inh Qidas Needed 11)  Cobal-1000 1000 Mcg/ml Inj Soln (Cyanocobalamin) .... Administer 1cc Im Every 2 Weeks 12)  Tylenol/codeine #3 300-30 Mg  Tabs (Acetaminophen-Codeine) .[name].. 1 By Mouth Qid Prn 13)  Ditropan Xl 15 Mg Xr24h-Tab (Oxybutynin Chloride) .... Three Times A Day 14)  Promethazine-Codeine 6.25-10 Mg/34ml Syrp (Promethazine-Codeine) .... 5-10 Ml By Mouth Q 6 Hours As Needed Cough 15)  Magic Mouthwash .... 5 Cc Swish , Hold and Swallow Qid 16)  Trazodone Hcl 50 Mg Tabs (Trazodone Hcl) .... 3-4 By Mouth At Bedtime 17)  Lyrica 50 Mg Caps (Pregabalin)  .[name].. 1 By Mouth Bid 18)  Cvs Vit D 5000 High-Potency 5000 Unit Caps (Cholecalciferol) .[name].. 1 By Mouth Qd 19)  Bd Insulin Syringe 26g X 1/2" 1 Ml Misc (Insulin Syringe-Needle U-100) .... As Dirr  Allergies: 1)  ! Penicillin V Potassium (Penicillin V Potassium) 2)  ! Sulfadiazine (Sulfadiazine) 3)  ! Reglan (Metoclopramide Hcl) 4)  ! Tetracycline Hcl (Tetracycline Hcl) 5)  ! Humibid Maximum Strength 6)  ! Robitussin Cough Drops 7)  ! Pravachol (Pravastatin Sodium) 8)  ! Flagyl 9)  ! Avelox (Moxifloxacin Hcl) 10)  ! Levoxyl (Levothyroxine Sodium) 11)  ! Erythromycin Base (Erythromycin Base) 12)  ! Vigamox (Moxifloxacin Hcl) 13)  ! Tofranil (Imipramine Hcl) 14)  ! Topamax (Topiramate) 15)  ! Ketek (Telithromycin) 16)  ! Keflex (Cephalexin) 17)  ! Zithromax (Azithromycin) 18)  ! Cipro (Ciprofloxacin Hcl) 19)  ! Septra Ds (Sulfamethoxazole-Trimethoprim) 20)  ! Advair Diskus (Fluticasone-Salmeterol) 21)  ! Serevent Diskus (Salmeterol Xinafoate) 22)  ! Maxair Autohaler (Pirbuterol Acetate) 23)  ! Prilosec (Omeprazole) 24)  ! Cyproheptadine Hcl (Cyproheptadine Hcl) 25)  ! Percocet (Oxycodone-Acetaminophen) 26)  ! Cephalexin (Cephalexin) 27)  ! Diflucan (Fluconazole) 28)  ! Amantadine Hcl (Amantadine Hcl) 29)  ! Doxycycline Hyclate (Doxycycline Hyclate) 30)  ! Tussirex 31)  ! Prevacid (  Lansoprazole) 32)  ! Singulair (Montelukast Sodium) 33)  ! Cleocin (Clindamycin Hcl) 34)  Ambien (Zolpidem Tartrate)  Past History:  Past Medical History: Last updated: 12/24/2008 Diverticulosis, colon GERD Hyperlipidemia Hypertension Seizure disorder Asthma Low back pain Osteoarthritis Vit B12 def Vit D def Sjogren's 2010 per Dr Manson Passey, DDS  Social History: Last updated: 03/21/2008 Occupation: disabled Married Never Smoked Alcohol use-no Drug use-no Regular exercise-no  Review of Systems       The patient complains of hoarseness and dyspnea on exertion.  The patient denies  fever, chest pain, prolonged cough, and abdominal pain.    Physical Exam  General:  NAD Eyes:  No corneal or conjunctival inflammation noted. EOMI. Perrla.  Ears:  External ear exam shows no significant lesions or deformities.  Otoscopic examination reveals clear canals, tympanic membranes are intact bilaterally without bulging, retraction, inflammation or discharge. Hearing is grossly normal bilaterally. Nose:  WNL Mouth:  Tongue with some thrush Lungs:  CTA Heart:  RRR Abdomen:  Bowel sounds positive,abdomen soft and non-tender without masses, organomegaly or hernias noted. Msk:  Using a cane  R ankle in a brace Extremities:  No edema Neurologic:  alert & oriented X3, abnormal gait, and ataxic gait.   Skin:  Intact without suspicious lesions or rashes Psych:  Oriented X3, memory intact for recent and remote, good eye contact,  depressed appearing, not agitated, and slightly anxious.  tearful.     Impression & Recommendations:  Problem # 1:  URI (ICD-465.9) Assessment New ABX if worse Her updated medication list for this problem includes:    Cyproheptadine Hcl 4 Mg Tabs (Cyproheptadine hcl) .[name]... 1 by mouth once daily    Promethazine-codeine 6.25-10 Mg/19ml Syrp (Promethazine-codeine) .[name]... 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 1 by mouth three times a day    Pulmicort 0.25 Mg/5ml Susp (Budesonide (inhalation)) .[name]... Three times a day    Combivent 103-18 Mcg/act Aero (Albuterol-ipratropium) .[name]... 2 inh qidas needed  Problem # 6:  WEIGHT LOSS, ABNORMAL (ICD-783.21) Assessment: Deteriorated Needs to eat  better!  Complete Medication List: 1)  Nexium 40 Mg Pack (Esomeprazole magnesium) .[name].. 1 by mouth two times a day 2)  Pravachol 40 Mg Tabs (Pravastatin sodium) .[name].. 1 by mouth two times a day 3)  Dilantin 100 Mg Caps (Phenytoin sodium extended) .... 2 in am and 1 in pm 4)  Klonopin 1 Mg Tabs (Clonazepam) .... 2   at bedtime for restless legs and 1 in am as needed  for anxiety 5)  Flexeril 10 Mg Tabs (Cyclobenzaprine hcl) .[name].. 1 by mouth tid 6)  Xopenex 1.25 Mg/40ml Nebu (Levalbuterol hcl) .[name].. 1 by mouth three times a day 7)  Pulmicort 0.25 Mg/53ml Susp (Budesonide (inhalation)) .... Three times a day 8)  Pancrease Mt 20 56-20-44 Mu Cpep (Amylase-lipase-protease) .... Three times a day as needed 9)  Cyproheptadine Hcl 4 Mg Tabs (Cyproheptadine hcl) .[name].. 1 by mouth once daily 10)  Combivent 103-18 Mcg/act Aero (Albuterol-ipratropium) .... 2 inh qidas needed 11)  Cobal-1000 1000 Mcg/ml Inj Soln (Cyanocobalamin) .... Administer 1cc im every 2 weeks 12)  Tylenol/codeine #3 300-30 Mg Tabs (Acetaminophen-codeine) .Marland KitchenMarland KitchenMarland Kitchen  1 by mouth qid prn 13)  Ditropan Xl 15 Mg Xr24h-tab (Oxybutynin chloride) .... Three times a day 14)  Promethazine-codeine 6.25-10 Mg/53ml Syrp (Promethazine-codeine) .... 5-10 ml by mouth q 6 hours as needed cough 15)  Magic Mouthwash  .... 5 cc swish , hold and swallow qid 16)  Trazodone Hcl 50 Mg Tabs (Trazodone hcl) .... 3-4 by mouth at bedtime 17)  Lyrica 50 Mg Caps (Pregabalin) .[name].. 1 by mouth bid 18)  Cvs Vit D 5000 High-potency 5000 Unit Caps (Cholecalciferol) .[name].. 1 by mouth qd 19)  Bd Insulin Syringe 26g X 1/2" 1 Ml Misc (Insulin syringe-needle u-100) .... As dirr 20)  Levaquin 500 Mg Tabs (Levofloxacin) .[name].. 1 by mouth qd 21)  Diflucan 100 Mg Tabs (Fluconazole) .... Take two tablets on the first day, than  1 by mouth once daily untill gone for a fungul infection  Patient Instructions: 1)  Please schedule a follow-up appointment in 3 months. 2)  BMP prior to visit,  ICD-9: 3)  Hepatic Panel prior to visit, ICD-9: 4)  Lipid Panel prior to visit, ICD-9: 5)  TSH prior to visit, ICD-9: 6)  CBC w/ Diff prior to visit, ICD-9: 7)  dilantin 995.20  Prescriptions: KLONOPIN 1 MG  TABS (CLONAZEPAM) 2   at bedtime for restless legs and 1 in am as needed  for anxiety Brand medically necessary #90 x 0   Entered and Authorized by:   Tresa Garter MD   Signed by:   Tresa Garter MD on 06/13/2009   Method used:   Print then Give to Patient   RxID:   [id number] PROMETHAZINE-CODEINE 6.25-10 MG/5ML SYRP (PROMETHAZINE-CODEINE) 5-10 ml by mouth q 6 hours as needed cough  #300 ml x 0   Entered and Authorized by:   Tresa Garter MD   Signed by:   Tresa Garter MD on 06/13/2009   Method used:   Print then Give to Patient   RxID:   [id number] MAGIC MOUTHWASH 5 cc swish , hold and swallow qid  #300 cc x 0   Entered and Authorized by:   Tresa Garter MD   Signed by:   Tresa Garter MD on 06/13/2009   Method used:   Print then Give to Patient   RxID:   [id number] DIFLUCAN 100 MG TABS (FLUCONAZOLE) Take two tablets on the first day, than  1 by mouth once daily untill gone for a fungul infection Brand medically necessary #11 x 1   Entered and Authorized by:   Tresa Garter MD   Signed by:   Tresa Garter MD on 06/13/2009   Method used:   Print then Give to Patient   RxID:   [id number] LEVAQUIN 500 MG TABS (LEVOFLOXACIN) 1 by mouth qd  #10 x 0   Entered and Authorized by:   Tresa Garter MD   Signed by:   Tresa Garter MD on 06/13/2009   Method used:   Print then Give to Patient   RxID:   [id number] TRAZODONE HCL 50 MG TABS (TRAZODONE HCL) 3-4 by mouth at bedtime  #120 x 6   Entered and Authorized by:   Tresa Garter MD   Signed by:   Tresa Garter MD on 06/13/2009   Method used:   Print then Give to Patient   RxID:   [id number]

## 2010-04-14 NOTE — Assessment & Plan Note (Signed)
Summary: ROA/PER PT REQ/JSS   Vital Signs:  Patient profile:   60 year old female Height:      63 inches Weight:      95 pounds BMI:     16.89 Temp:     98.0 degrees F oral Pulse rate:   68 / minute Pulse rhythm:   regular Resp:     16 per minute BP sitting:   140 / 84  (left arm) Cuff size:   regular  Vitals Entered By: Lanier Prude, Beverly Gust) (January 09, 2010 3:46 PM) CC: f/u Is Patient Diabetic? No   CC:  f/u.  History of Present Illness: The patient presents for a follow up of back pain, anxiety, depression and headaches.   Current Medications (verified): 1)  Nexium 40 Mg  Pack (Esomeprazole Magnesium) .[name].. 1 By Mouth Two Times A Day 2)  Dilantin 100 Mg  Caps (Phenytoin Sodium Extended) .[name].. 1 in Am and 1 in Pm 3)  Klonopin 1 Mg  Tabs (Clonazepam) .... 2   At Bedtime For Restless Legs and 1 in Am As Needed  For Anxiety 4)  Flexeril 10 Mg  Tabs (Cyclobenzaprine Hcl) .[name].. 1 By Mouth Tid 5)  Xopenex 1.25 Mg/25ml  Nebu (Levalbuterol Hcl) .[name].. 1 By Mouth Three Times A Day 6)  Pulmicort 0.25 Mg/53ml  Susp (Budesonide (Inhalation)) .... Three Times A Day 7)  Cyproheptadine Hcl 4 Mg  Tabs (Cyproheptadine Hcl) .[name].. 1 By Mouth Once Daily 8)  Combivent 103-18 Mcg/act  Aero (Albuterol-Ipratropium) .... 2 Inh Qidas Needed 9)  Cobal-1000 1000 Mcg/ml Inj Soln (Cyanocobalamin) .... Administer 1cc Im Every 2 Weeks 10)  Tylenol/codeine #3 300-30 Mg  Tabs (Acetaminophen-Codeine) .[name].. 1 By Mouth Qid Prn 11)  Ditropan Xl 15 Mg Xr24h-Tab (Oxybutynin Chloride) .... Three Times A Day 12)  Promethazine-Codeine 6.25-10 Mg/76ml Syrp (Promethazine-Codeine) .... 5-10 Ml By Mouth Q 6 Hours As Needed Cough 13)  Magic Mouthwash .... 5 Cc Swish , Hold and Swallow Qid 14)  Trazodone Hcl 50 Mg Tabs (Trazodone Hcl) .... 3-4 By Mouth At Bedtime 15)  Lyrica 50 Mg Caps (Pregabalin) .[name].. 1 By Mouth Bid 16)  Cvs Vit D 5000 High-Potency 5000 Unit Caps (Cholecalciferol) .[name].. 1 By Mouth Qd 17)  Bd Insulin Syringe  26g X 1/2" 1 Ml Misc (Insulin Syringe-Needle U-100) .... As Dirr 18)  Pravachol 80 Mg Tabs (Pravastatin Sodium) .[name].. 1 By Mouth Qd  Allergies (verified): 1)  ! Penicillin V Potassium (Penicillin V Potassium) 2)  ! Sulfadiazine (Sulfadiazine) 3)  ! Reglan (Metoclopramide Hcl) 4)  ! Tetracycline Hcl (Tetracycline Hcl) 5)  ! Humibid Maximum Strength 6)  ! Robitussin Cough Drops 7)  ! Pravachol (Pravastatin Sodium) 8)  ! Flagyl 9)  ! Avelox (Moxifloxacin Hcl) 10)  ! Levoxyl (Levothyroxine Sodium) 11)  ! Erythromycin Base (Erythromycin Base) 12)  ! Vigamox (Moxifloxacin Hcl) 13)  ! Tofranil (Imipramine Hcl) 14)  ! Topamax (Topiramate) 15)  ! Ketek (Telithromycin) 16)  ! Keflex (Cephalexin) 17)  ! Zithromax (Azithromycin) 18)  ! Cipro (Ciprofloxacin Hcl) 19)  ! Septra Ds (Sulfamethoxazole-Trimethoprim) 20)  ! Advair Diskus (Fluticasone-Salmeterol) 21)  ! Serevent Diskus (Salmeterol Xinafoate) 22)  ! Maxair Autohaler (Pirbuterol Acetate) 23)  ! Prilosec (Omeprazole) 24)  ! Cyproheptadine Hcl (Cyproheptadine Hcl) 25)  ! Percocet (Oxycodone-Acetaminophen) 26)  ! Cephalexin (Cephalexin) 27)  ! Diflucan (Fluconazole) 28)  ! Amantadine Hcl (Amantadine Hcl) 29)  ! Doxycycline Hyclate (Doxycycline Hyclate) 30)  ! Tussirex 31)  ! Prevacid (Lansoprazole) 32)  !  Singulair (Montelukast Sodium) 33)  ! Cleocin (Clindamycin Hcl) 34)  Ambien (Zolpidem Tartrate)  Past History:  Past Medical History: Last updated: 12/24/2008 Diverticulosis, colon GERD Hyperlipidemia Hypertension Seizure disorder Asthma Low back pain Osteoarthritis Vit B12 def Vit D def Sjogren's 2010 per Dr Manson Passey, DDS  Social History: Last updated: 03/21/2008 Occupation: disabled Married Never Smoked Alcohol use-no Drug use-no Regular exercise-no  Review of Systems       The patient complains of weight loss.  The patient denies abdominal pain, melena, hematochezia, and severe indigestion/heartburn.     Physical Exam  General:  NAD  Chronically ill-appearing and thin Eyes:  No corneal or conjunctival inflammation noted. EOMI. Perrla.  Nose:  WNL Mouth:  Tongue with no thrush Neck:  No deformities, masses, or tenderness noted. Lungs:  CTA Heart:  RRR Abdomen:  Bowel sounds positive,abdomen soft and non-tender without masses, organomegaly or hernias noted. Msk:  Using a cane  B ankles in a brace Neurologic:  alert & oriented X3, abnormal gait, and ataxic gait.   Skin:  Intact without suspicious lesions or rashes Psych:  Oriented X3, memory intact for recent and remote, good eye contact,  less depressed appearing, not agitated, and slightly anxious. Not  tearful.     Impression & Recommendations:  Problem # 1:  COPD (ICD-496) Assessment Unchanged  Her updated medication list for this problem includes:    Xopenex 1.25 Mg/47ml Nebu (Levalbuterol hcl) .[name]... 1 by mouth three times a day    Pulmicort 0.25 Mg/67ml Susp (Budesonide (inhalation)) .[name]... Three times a day    Combivent 103-18 Mcg/act Aero (Albuterol-ipratropium) .[name]... 2 inh qidas needed  Problem # 2:  GRIEF REACTION (ICD-309.0) Assessment: Unchanged  Problem # 3:  VITAMIN B12 DEFICIENCY (ICD-266.2) Assessment: Unchanged On the regimen of medicine(s) reflected in the chart    Problem # 4:  WEIGHT LOSS, ABNORMAL (ICD-783.21) Assessment: Deteriorated See "Patient Instructions". ? etiology....  Problem # 5:  LOW BACK PAIN (ICD-724.2) Assessment: Unchanged  Her updated medication list for this problem includes:    Flexeril 10 Mg Tabs (Cyclobenzaprine hcl) .[name]... 1 by mouth tid    Tylenol/codeine #3 300-30 Mg Tabs (Acetaminophen-codeine) .[name]... 1 by mouth qid prn  Complete Medication List: 1)  Nexium 40 Mg Pack (Esomeprazole magnesium) .[name].. 1 by mouth two times a day 2)  Dilantin 100 Mg Caps (Phenytoin sodium extended) .[name].. 1 in am and 1 in pm 3)  Klonopin 1 Mg Tabs (Clonazepam) .... 2   at bedtime for restless legs  and 1 in am as needed  for anxiety 4)  Flexeril 10 Mg Tabs (Cyclobenzaprine hcl) .[name].. 1 by mouth tid 5)  Xopenex 1.25 Mg/48ml Nebu (Levalbuterol hcl) .[name].. 1 by mouth three times a day 6)  Pulmicort 0.25 Mg/16ml Susp (Budesonide (inhalation)) .... Three times a day 7)  Cyproheptadine Hcl 4 Mg Tabs (Cyproheptadine hcl) .[name].. 1 by mouth once daily 8)  Combivent 103-18 Mcg/act Aero (Albuterol-ipratropium) .... 2 inh qidas needed 9)  Cobal-1000 1000 Mcg/ml Inj Soln (Cyanocobalamin) .... Administer 1cc im every 2 weeks 10)  Tylenol/codeine #3 300-30 Mg Tabs (Acetaminophen-codeine) .[name].. 1 by mouth qid prn 11)  Ditropan Xl 15 Mg Xr24h-tab (Oxybutynin chloride) .... Three times a day 12)  Promethazine-codeine 6.25-10 Mg/82ml Syrp (Promethazine-codeine) .... 5-10 ml by mouth q 6 hours as needed cough 13)  Magic Mouthwash  .... 5 cc swish , hold and swallow qid 14)  Trazodone Hcl 50 Mg Tabs (Trazodone hcl) .... 3-4 by mouth at bedtime  15)  Lyrica 50 Mg Caps (Pregabalin) .[name].. 1 by mouth bid 16)  Cvs Vit D 5000 High-potency 5000 Unit Caps (Cholecalciferol) .[name].. 1 by mouth qd 17)  Bd Insulin Syringe 26g X 1/2" 1 Ml Misc (Insulin syringe-needle u-100) .... As dirr 18)  Pravachol 80 Mg Tabs (Pravastatin sodium) .[name].. 1 by mouth qd  Patient Instructions: 1)  Please schedule a follow-up appointment in 3 months. 2)  Put some weight on, eat better!!!   Orders Added: 1)  Est. Patient Level IV [60454]    Contraindications/Deferment of Procedures/Staging:    Test/Procedure: FLU VAX    Reason for deferment: patient declined

## 2010-04-14 NOTE — Letter (Signed)
Summary: [facility]  [facility]   Imported By: Lennie Odor 07/15/2009 15:08:10  _____________________________________________________________________  External Attachment:    Type:   Image     Comment:   External Document

## 2010-04-14 NOTE — Assessment & Plan Note (Signed)
Summary: 3 mo rov /nws   Vital Signs:  Patient profile:   60 year old female Height:      63 inches Weight:      99 pounds BMI:     17.60 Temp:     99.0 degrees F oral Pulse rate:   84 / minute Pulse rhythm:   regular BP sitting:   138 / 78  (left arm) Cuff size:   regular  Vitals Entered By: Lanier Prude, CMA(AAMA) (October 03, 2009 2:54 PM) CC: 3 mo f/u Is Patient Diabetic? No   CC:  3 mo f/u.  History of Present Illness: The patient presents for a follow up of back pain, anxiety, depression, insomnia,  headaches.   Current Medications (verified): 1)  Nexium 40 Mg  Pack (Esomeprazole Magnesium) .[name].. 1 By Mouth Two Times A Day 2)  Pravachol 40 Mg  Tabs (Pravastatin Sodium) .[name].. 1 By Mouth Two Times A Day 3)  Dilantin 100 Mg  Caps (Phenytoin Sodium Extended) .... 2 in Am and 1 in Pm 4)  Klonopin 1 Mg  Tabs (Clonazepam) .... 2   At Bedtime For Restless Legs and 1 in Am As Needed  For Anxiety 5)  Flexeril 10 Mg  Tabs (Cyclobenzaprine Hcl) .[name].. 1 By Mouth Tid 6)  Xopenex 1.25 Mg/26ml  Nebu (Levalbuterol Hcl) .[name].. 1 By Mouth Three Times A Day 7)  Pulmicort 0.25 Mg/35ml  Susp (Budesonide (Inhalation)) .... Three Times A Day 8)  Pancrease Mt 20 56-20-44 Mu  Cpep (Amylase-Lipase-Protease) .... Three Times A Day As Needed 9)  Cyproheptadine Hcl 4 Mg  Tabs (Cyproheptadine Hcl) .[name].. 1 By Mouth Once Daily 10)  Combivent 103-18 Mcg/act  Aero (Albuterol-Ipratropium) .... 2 Inh Qidas Needed 11)  Cobal-1000 1000 Mcg/ml Inj Soln (Cyanocobalamin) .... Administer 1cc Im Every 2 Weeks 12)  Tylenol/codeine #3 300-30 Mg  Tabs (Acetaminophen-Codeine) .[name].. 1 By Mouth Qid Prn 13)  Ditropan Xl 15 Mg Xr24h-Tab (Oxybutynin Chloride) .... Three Times A Day 14)  Promethazine-Codeine 6.25-10 Mg/23ml Syrp (Promethazine-Codeine) .... 5-10 Ml By Mouth Q 6 Hours As Needed Cough 15)  Magic Mouthwash .... 5 Cc Swish , Hold and Swallow Qid 16)  Trazodone Hcl 50 Mg Tabs (Trazodone Hcl) .... 3-4 By Mouth At  Bedtime 17)  Lyrica 50 Mg Caps (Pregabalin) .[name].. 1 By Mouth Bid 18)  Cvs Vit D 5000 High-Potency 5000 Unit Caps (Cholecalciferol) .[name].. 1 By Mouth Qd 19)  Bd Insulin Syringe 26g X 1/2" 1 Ml Misc (Insulin Syringe-Needle U-100) .... As Dirr 20)  Levaquin 500 Mg Tabs (Levofloxacin) .[name].. 1 By Mouth Qd  Allergies (verified): 1)  ! Penicillin V Potassium (Penicillin V Potassium) 2)  ! Sulfadiazine (Sulfadiazine) 3)  ! Reglan (Metoclopramide Hcl) 4)  ! Tetracycline Hcl (Tetracycline Hcl) 5)  ! Humibid Maximum Strength 6)  ! Robitussin Cough Drops 7)  ! Pravachol (Pravastatin Sodium) 8)  ! Flagyl 9)  ! Avelox (Moxifloxacin Hcl) 10)  ! Levoxyl (Levothyroxine Sodium) 11)  ! Erythromycin Base (Erythromycin Base) 12)  ! Vigamox (Moxifloxacin Hcl) 13)  ! Tofranil (Imipramine Hcl) 14)  ! Topamax (Topiramate) 15)  ! Ketek (Telithromycin) 16)  ! Keflex (Cephalexin) 17)  ! Zithromax (Azithromycin) 18)  ! Cipro (Ciprofloxacin Hcl) 19)  ! Septra Ds (Sulfamethoxazole-Trimethoprim) 20)  ! Advair Diskus (Fluticasone-Salmeterol) 21)  ! Serevent Diskus (Salmeterol Xinafoate) 22)  ! Maxair Autohaler (Pirbuterol Acetate) 23)  ! Prilosec (Omeprazole) 24)  ! Cyproheptadine Hcl (Cyproheptadine Hcl) 25)  ! Percocet (Oxycodone-Acetaminophen) 26)  !  Cephalexin (Cephalexin) 27)  ! Diflucan (Fluconazole) 28)  ! Amantadine Hcl (Amantadine Hcl) 29)  ! Doxycycline Hyclate (Doxycycline Hyclate) 30)  ! Tussirex 31)  ! Prevacid (Lansoprazole) 32)  ! Singulair (Montelukast Sodium) 33)  ! Cleocin (Clindamycin Hcl) 34)  Ambien (Zolpidem Tartrate)  Past History:  Past Medical History: Last updated: 12/24/2008 Diverticulosis, colon GERD Hyperlipidemia Hypertension Seizure disorder Asthma Low back pain Osteoarthritis Vit B12 def Vit D def Sjogren's 2010 per Dr Manson Passey, DDS  Social History: Last updated: 03/21/2008 Occupation: disabled Married Never Smoked Alcohol use-no Drug use-no Regular  exercise-no  Past Surgical History: Oral surgery  Social History: Reviewed history from 03/21/2008 and no changes required. Occupation: disabled Married Never Smoked Alcohol use-no Drug use-no Regular exercise-no  Review of Systems       The patient complains of difficulty walking and depression.  The patient denies fever, dyspnea on exertion, prolonged cough, and abdominal pain.    Physical Exam  General:  NAD Nose:  WNL Mouth:  Tongue with no thrush Neck:  No deformities, masses, or tenderness noted. Lungs:  CTA Heart:  RRR Abdomen:  Bowel sounds positive,abdomen soft and non-tender without masses, organomegaly or hernias noted. Msk:  Using a cane  B ankles in a brace Neurologic:  alert & oriented X3, abnormal gait, and ataxic gait.   Skin:  Intact without suspicious lesions or rashes Psych:  Oriented X3, memory intact for recent and remote, good eye contact,  less depressed appearing, not agitated, and slightly anxious. Not  tearful.     Impression & Recommendations:  Problem # 1:  WEIGHT LOSS, ABNORMAL (ICD-783.21) Assessment Deteriorated Discussed diet  Problem # 2:  HYPERTENSION (ICD-401.9) Assessment: Improved  BP today: 138/78 Prior BP: 124/76 (06/13/2009)  Labs Reviewed: K+: 5.7 (09/26/2009) Creat: : 0.7 (09/26/2009)   Chol: 257 (09/26/2009)   HDL: 71.40 (09/26/2009)   LDL: DEL (12/19/2007)   TG: 73.0 (09/26/2009)  Problem # 3:  ECCHYMOSES (ICD-782.9) Assessment: New Poss due to #4 The labs were reviewed with the patient.   Problem # 4:  Dilantin toxicity Dose reduced  Problem # 5:  FEVER UNSPECIFIED (ICD-780.60) Assessment: New Will watch Orders: TLB-Udip ONLY (81003-UDIP)  Complete Medication List: 1)  Nexium 40 Mg Pack (Esomeprazole magnesium) .[name].. 1 by mouth two times a day 2)  Pravachol 40 Mg Tabs (Pravastatin sodium) .[name].. 1 by mouth two times a day 3)  Dilantin 100 Mg Caps (Phenytoin sodium extended) .[name].. 1 in am and 1 in pm 4)   Klonopin 1 Mg Tabs (Clonazepam) .... 2   at bedtime for restless legs and 1 in am as needed  for anxiety 5)  Flexeril 10 Mg Tabs (Cyclobenzaprine hcl) .[name].. 1 by mouth tid 6)  Xopenex 1.25 Mg/20ml Nebu (Levalbuterol hcl) .[name].. 1 by mouth three times a day 7)  Pulmicort 0.25 Mg/53ml Susp (Budesonide (inhalation)) .... Three times a day 8)  Cyproheptadine Hcl 4 Mg Tabs (Cyproheptadine hcl) .[name].. 1 by mouth once daily 9)  Combivent 103-18 Mcg/act Aero (Albuterol-ipratropium) .... 2 inh qidas needed 10)  Cobal-1000 1000 Mcg/ml Inj Soln (Cyanocobalamin) .... Administer 1cc im every 2 weeks 11)  Tylenol/codeine #3 300-30 Mg Tabs (Acetaminophen-codeine) .[name].. 1 by mouth qid prn 12)  Ditropan Xl 15 Mg Xr24h-tab (Oxybutynin chloride) .... Three times a day 13)  Promethazine-codeine 6.25-10 Mg/46ml Syrp (Promethazine-codeine) .... 5-10 ml by mouth q 6 hours as needed cough 14)  Magic Mouthwash  .... 5 cc swish , hold and swallow qid 15)  Trazodone  Hcl 50 Mg Tabs (Trazodone hcl) .... 3-4 by mouth at bedtime 16)  Lyrica 50 Mg Caps (Pregabalin) .[name].. 1 by mouth bid 17)  Cvs Vit D 5000 High-potency 5000 Unit Caps (Cholecalciferol) .[name].. 1 by mouth qd 18)  Bd Insulin Syringe 26g X 1/2" 1 Ml Misc (Insulin syringe-needle u-100) .... As dirr  Patient Instructions: 1)  Please schedule a follow-up appointment in 6 weeks. 2)  BMP prior to visit, ICD-9: 3)  dilantin level 995.20 4)  INR 5)  CBC w/ Diff prior to visit, ICD-9: Prescriptions: COMBIVENT 103-18 MCG/ACT  AERO (ALBUTEROL-IPRATROPIUM) 2 inh qidas needed  #1 x 12   Entered and Authorized by:   Tresa Garter MD   Signed by:   Tresa Garter MD on 10/03/2009   Method used:   Electronically to        [facility] [phone]* (retail)       [address]       [place], [place]  [address]       Ph: [phone] or [phone]       Fax: [phone]   RxID:   [id number]

## 2010-04-14 NOTE — Assessment & Plan Note (Signed)
Summary: FU /NWS   Vital Signs:  Patient profile:   60 year old female Height:      63 inches Weight:      99 pounds BMI:     17.60 O2 Sat:      98 % on Room air Temp:     98.6 degrees F oral Pulse rate:   64 / minute Pulse rhythm:   regular Resp:     16 per minute BP sitting:   120 / 80  (left arm) Cuff size:   regular  Vitals Entered By: Lanier Prude, CMA(AAMA) (November 14, 2009 1:23 PM)  O2 Flow:  Room air CC: f/u Is Patient Diabetic? No   CC:  f/u.  History of Present Illness: The patient presents for a follow up of  siezure disorder, COPD, back pain, anxiety, depression   Current Medications (verified): 1)  Nexium 40 Mg  Pack (Esomeprazole Magnesium) .[name].. 1 By Mouth Two Times A Day 2)  Dilantin 100 Mg  Caps (Phenytoin Sodium Extended) .[name].. 1 in Am and 1 in Pm 3)  Klonopin 1 Mg  Tabs (Clonazepam) .... 2   At Bedtime For Restless Legs and 1 in Am As Needed  For Anxiety 4)  Flexeril 10 Mg  Tabs (Cyclobenzaprine Hcl) .[name].. 1 By Mouth Tid 5)  Xopenex 1.25 Mg/58ml  Nebu (Levalbuterol Hcl) .[name].. 1 By Mouth Three Times A Day 6)  Pulmicort 0.25 Mg/28ml  Susp (Budesonide (Inhalation)) .... Three Times A Day 7)  Cyproheptadine Hcl 4 Mg  Tabs (Cyproheptadine Hcl) .[name].. 1 By Mouth Once Daily 8)  Combivent 103-18 Mcg/act  Aero (Albuterol-Ipratropium) .... 2 Inh Qidas Needed 9)  Cobal-1000 1000 Mcg/ml Inj Soln (Cyanocobalamin) .... Administer 1cc Im Every 2 Weeks 10)  Tylenol/codeine #3 300-30 Mg  Tabs (Acetaminophen-Codeine) .[name].. 1 By Mouth Qid Prn 11)  Ditropan Xl 15 Mg Xr24h-Tab (Oxybutynin Chloride) .... Three Times A Day 12)  Promethazine-Codeine 6.25-10 Mg/23ml Syrp (Promethazine-Codeine) .... 5-10 Ml By Mouth Q 6 Hours As Needed Cough 13)  Magic Mouthwash .... 5 Cc Swish , Hold and Swallow Qid 14)  Trazodone Hcl 50 Mg Tabs (Trazodone Hcl) .... 3-4 By Mouth At Bedtime 15)  Lyrica 50 Mg Caps (Pregabalin) .[name].. 1 By Mouth Bid 16)  Cvs Vit D 5000 High-Potency 5000 Unit Caps  (Cholecalciferol) .[name].. 1 By Mouth Qd 17)  Bd Insulin Syringe 26g X 1/2" 1 Ml Misc (Insulin Syringe-Needle U-100) .... As Dirr 18)  Pravachol 80 Mg Tabs (Pravastatin Sodium) .[name].. 1 By Mouth Qd  Allergies (verified): 1)  ! Penicillin V Potassium (Penicillin V Potassium) 2)  ! Sulfadiazine (Sulfadiazine) 3)  ! Reglan (Metoclopramide Hcl) 4)  ! Tetracycline Hcl (Tetracycline Hcl) 5)  ! Humibid Maximum Strength 6)  ! Robitussin Cough Drops 7)  ! Pravachol (Pravastatin Sodium) 8)  ! Flagyl 9)  ! Avelox (Moxifloxacin Hcl) 10)  ! Levoxyl (Levothyroxine Sodium) 11)  ! Erythromycin Base (Erythromycin Base) 12)  ! Vigamox (Moxifloxacin Hcl) 13)  ! Tofranil (Imipramine Hcl) 14)  ! Topamax (Topiramate) 15)  ! Ketek (Telithromycin) 16)  ! Keflex (Cephalexin) 17)  ! Zithromax (Azithromycin) 18)  ! Cipro (Ciprofloxacin Hcl) 19)  ! Septra Ds (Sulfamethoxazole-Trimethoprim) 20)  ! Advair Diskus (Fluticasone-Salmeterol) 21)  ! Serevent Diskus (Salmeterol Xinafoate) 22)  ! Maxair Autohaler (Pirbuterol Acetate) 23)  ! Prilosec (Omeprazole) 24)  ! Cyproheptadine Hcl (Cyproheptadine Hcl) 25)  ! Percocet (Oxycodone-Acetaminophen) 26)  ! Cephalexin (Cephalexin) 27)  ! Diflucan (Fluconazole) 28)  ! Amantadine  Hcl (Amantadine Hcl) 29)  ! Doxycycline Hyclate (Doxycycline Hyclate) 30)  ! Tussirex 31)  ! Prevacid (Lansoprazole) 32)  ! Singulair (Montelukast Sodium) 33)  ! Cleocin (Clindamycin Hcl) 34)  Ambien (Zolpidem Tartrate)  Past History:  Past Medical History: Last updated: 12/24/2008 Diverticulosis, colon GERD Hyperlipidemia Hypertension Seizure disorder Asthma Low back pain Osteoarthritis Vit B12 def Vit D def Sjogren's 2010 per Dr Manson Passey, DDS  Past Surgical History: Last updated: 10/03/2009 Oral surgery  Social History: Last updated: 03/21/2008 Occupation: disabled Married Never Smoked Alcohol use-no Drug use-no Regular exercise-no  Physical Exam  General:   NAD  Chronically ill-appearing  Ears:  External ear exam shows no significant lesions or deformities.  Otoscopic examination reveals clear canals, tympanic membranes are intact bilaterally without bulging, retraction, inflammation or discharge. Hearing is grossly normal bilaterally. Nose:  WNL Mouth:  Tongue with no thrush Neck:  No deformities, masses, or tenderness noted. Lungs:  CTA Heart:  RRR Abdomen:  Bowel sounds positive,abdomen soft and non-tender without masses, organomegaly or hernias noted. Msk:  Using a cane  B ankles in a brace Extremities:  No edema Neurologic:  alert & oriented X3, abnormal gait, and ataxic gait.   Skin:  Intact without suspicious lesions or rashes Psych:  Oriented X3, memory intact for recent and remote, good eye contact,  less depressed appearing, not agitated, and slightly anxious. Not  tearful.     Impression & Recommendations:  Problem # 1:  HYPERKALEMIA (ICD-276.7)  Problem # 2:  VITAMIN B12 DEFICIENCY (ICD-266.2)  Problem # 3:  BRONCHITIS, ACUTE (ICD-466.0)  Her updated medication list for this problem includes:    Xopenex 1.25 Mg/58ml Nebu (Levalbuterol hcl) .[name]... 1 by mouth three times a day    Pulmicort 0.25 Mg/20ml Susp (Budesonide (inhalation)) .[name]... Three times a day    Combivent 103-18 Mcg/act Aero (Albuterol-ipratropium) .[name]... 2 inh qidas needed    Promethazine-codeine 6.25-10 Mg/40ml Syrp (Promethazine-codeine) .[name]... 5-10 ml by mouth q 6 hours as needed cough  Problem # 4:  LOW BACK PAIN (ICD-724.2)  Her updated medication list for this problem includes:    Flexeril 10 Mg Tabs (Cyclobenzaprine hcl) .[name]... 1 by mouth tid    Tylenol/codeine #3 300-30 Mg Tabs (Acetaminophen-codeine) .[name]... 1 by mouth qid prn  Problem # 5:  SEIZURE DISORDER (ICD-780.39) Assessment: Unchanged  Her updated medication list for this problem includes:    Dilantin 100 Mg Caps (Phenytoin sodium extended) .[name]... 1 in am and 1 in pm    Klonopin 1 Mg Tabs  (Clonazepam) .[name]... 2   at bedtime for restless legs and 1 in am as needed  for anxiety    Lyrica 50 Mg Caps (Pregabalin) .[name]... 1 by mouth bid The labs were reviewed with the patient.   Problem # 6:  HYPERTENSION (ICD-401.9) Assessment: Unchanged  BP today: 120/80 Prior BP: 138/78 (10/03/2009)  Labs Reviewed: K+: 5.7 (11/07/2009) Creat: : 0.7 (11/07/2009)   Chol: 257 (09/26/2009)   HDL: 71.40 (09/26/2009)   LDL: DEL (12/19/2007)   TG: 73.0 (09/26/2009)  Complete Medication List: 1)  Nexium 40 Mg Pack (Esomeprazole magnesium) .[name].. 1 by mouth two times a day 2)  Dilantin 100 Mg Caps (Phenytoin sodium extended) .[name].. 1 in am and 1 in pm 3)  Klonopin 1 Mg Tabs (Clonazepam) .... 2   at bedtime for restless legs and 1 in am as needed  for anxiety 4)  Flexeril 10 Mg Tabs (Cyclobenzaprine hcl) .[name].. 1 by mouth tid 5)  Xopenex 1.25  Mg/6ml Nebu (Levalbuterol hcl) .[name].. 1 by mouth three times a day 6)  Pulmicort 0.25 Mg/59ml Susp (Budesonide (inhalation)) .... Three times a day 7)  Cyproheptadine Hcl 4 Mg Tabs (Cyproheptadine hcl) .[name].. 1 by mouth once daily 8)  Combivent 103-18 Mcg/act Aero (Albuterol-ipratropium) .... 2 inh qidas needed 9)  Cobal-1000 1000 Mcg/ml Inj Soln (Cyanocobalamin) .... Administer 1cc im every 2 weeks 10)  Tylenol/codeine #3 300-30 Mg Tabs (Acetaminophen-codeine) .[name].. 1 by mouth qid prn 11)  Ditropan Xl 15 Mg Xr24h-tab (Oxybutynin chloride) .... Three times a day 12)  Promethazine-codeine 6.25-10 Mg/31ml Syrp (Promethazine-codeine) .... 5-10 ml by mouth q 6 hours as needed cough 13)  Magic Mouthwash  .... 5 cc swish , hold and swallow qid 14)  Trazodone Hcl 50 Mg Tabs (Trazodone hcl) .... 3-4 by mouth at bedtime 15)  Lyrica 50 Mg Caps (Pregabalin) .[name].. 1 by mouth bid 16)  Cvs Vit D 5000 High-potency 5000 Unit Caps (Cholecalciferol) .[name].. 1 by mouth qd 17)  Bd Insulin Syringe 26g X 1/2" 1 Ml Misc (Insulin syringe-needle u-100) .... As dirr 18)  Pravachol 80 Mg Tabs (Pravastatin  sodium) .[name].. 1 by mouth qd  Patient Instructions: 1)  Please schedule a follow-up appointment in 2 months. 2)  BMP prior to visit, ICD-9: 995.20 Prescriptions: PULMICORT 0.25 MG/2ML  SUSP (BUDESONIDE (INHALATION)) three times a day  #3 boxes x 6   Entered and Authorized by:   Tresa Garter MD   Signed by:   Tresa Garter MD on 11/14/2009   Method used:   Print then Give to Patient   RxID:   [id number] XOPENEX 1.25 MG/3ML  NEBU (LEVALBUTEROL HCL) 1 by mouth three times a day  #4 boxes x 3   Entered and Authorized by:   Tresa Garter MD   Signed by:   Tresa Garter MD on 11/14/2009   Method used:   Print then Give to Patient   RxID:   [id number] PULMICORT 0.25 MG/2ML  SUSP (BUDESONIDE (INHALATION)) three times a day  #3 x 6   Entered and Authorized by:   Tresa Garter MD   Signed by:   Tresa Garter MD on 11/14/2009   Method used:   Print then Give to Patient   RxID:   [id number]

## 2010-04-24 ENCOUNTER — Encounter: Payer: Self-pay | Admitting: Internal Medicine

## 2010-04-24 ENCOUNTER — Ambulatory Visit (INDEPENDENT_AMBULATORY_CARE_PROVIDER_SITE_OTHER): Payer: BC Managed Care – PPO | Admitting: Internal Medicine

## 2010-04-24 DIAGNOSIS — J40 Bronchitis, not specified as acute or chronic: Secondary | ICD-10-CM

## 2010-04-24 DIAGNOSIS — I10 Essential (primary) hypertension: Secondary | ICD-10-CM

## 2010-04-24 DIAGNOSIS — J449 Chronic obstructive pulmonary disease, unspecified: Secondary | ICD-10-CM

## 2010-04-24 DIAGNOSIS — K219 Gastro-esophageal reflux disease without esophagitis: Secondary | ICD-10-CM

## 2010-04-24 DIAGNOSIS — J209 Acute bronchitis, unspecified: Secondary | ICD-10-CM | POA: Insufficient documentation

## 2010-04-30 NOTE — Assessment & Plan Note (Signed)
Summary: FU/NWS   Vital Signs:  Patient profile:   60 year old female Height:      63 inches Weight:      100 pounds Temp:     98.5 degrees F oral Pulse rate:   76 / minute Pulse rhythm:   regular Resp:     16 per minute BP sitting:   128 / 76  (left arm) Cuff size:   regular  Vitals Entered By: Lanier Prude, CMA(AAMA) (April 24, 2010 1:31 PM) CC: f/u c/o cough X 1 wk Is Patient Diabetic? No   CC:  f/u c/o cough X 1 wk.  History of Present Illness: The patient presents with complaints of sore throat, fever, cough, sinus congestion and drainge of sevendays duration. Not better with OTC meds. Chest hurts with coughing. .  The mucus is colored.  F/u HTN  - it was 140/90 a couple times F/u GERD - needs Nexium F/u Wt loss  Current Medications (verified): 1)  Nexium 40 Mg  Pack (Esomeprazole Magnesium) .[name].. 1 By Mouth Two Times A Day 2)  Dilantin 100 Mg  Caps (Phenytoin Sodium Extended) .[name].. 1 in Am and 1 in Pm 3)  Klonopin 1 Mg  Tabs (Clonazepam) .... 2   At Bedtime For Restless Legs and 1 in Am As Needed  For Anxiety 4)  Flexeril 10 Mg  Tabs (Cyclobenzaprine Hcl) .[name].. 1 By Mouth Tid 5)  Xopenex 1.25 Mg/7ml  Nebu (Levalbuterol Hcl) .[name].. 1 By Mouth Three Times A Day 6)  Pulmicort 0.25 Mg/16ml  Susp (Budesonide (Inhalation)) .... Three Times A Day 7)  Cyproheptadine Hcl 4 Mg  Tabs (Cyproheptadine Hcl) .[name].. 1 By Mouth Once Daily 8)  Combivent 103-18 Mcg/act  Aero (Albuterol-Ipratropium) .... 2 Inh Qidas Needed 9)  Cobal-1000 1000 Mcg/ml Inj Soln (Cyanocobalamin) .... Administer 1cc Im Every 2 Weeks 10)  Tylenol/codeine #3 300-30 Mg  Tabs (Acetaminophen-Codeine) .[name].. 1 By Mouth Qid Prn 11)  Ditropan Xl 15 Mg Xr24h-Tab (Oxybutynin Chloride) .... Three Times A Day 12)  Promethazine-Codeine 6.25-10 Mg/32ml Syrp (Promethazine-Codeine) .... 5-10 Ml By Mouth Q 6 Hours As Needed Cough 13)  Magic Mouthwash .... 5 Cc Swish , Hold and Swallow Qid 14)  Trazodone Hcl 50 Mg Tabs (Trazodone  Hcl) .... 3-4 By Mouth At Bedtime 15)  Lyrica 50 Mg Caps (Pregabalin) .[name].. 1 By Mouth Bid 16)  Cvs Vit D 5000 High-Potency 5000 Unit Caps (Cholecalciferol) .[name].. 1 By Mouth Qd 17)  Bd Insulin Syringe 26g X 1/2" 1 Ml Misc (Insulin Syringe-Needle U-100) .... As Dirr 18)  Pravachol 80 Mg Tabs (Pravastatin Sodium) .[name].. 1 By Mouth Qd  Allergies (verified): 1)  ! Penicillin V Potassium (Penicillin V Potassium) 2)  ! Sulfadiazine (Sulfadiazine) 3)  ! Reglan (Metoclopramide Hcl) 4)  ! Tetracycline Hcl (Tetracycline Hcl) 5)  ! Humibid Maximum Strength 6)  ! Robitussin Cough Drops 7)  ! Pravachol 8)  ! Flagyl 9)  ! Avelox (Moxifloxacin Hcl) 10)  ! Levoxyl (Levothyroxine Sodium) 11)  ! Erythromycin Base (Erythromycin Base) 12)  ! Vigamox (Moxifloxacin Hcl) 13)  ! Tofranil (Imipramine Hcl) 14)  ! Topamax (Topiramate) 15)  ! Ketek (Telithromycin) 16)  ! Keflex (Cephalexin) 17)  ! Zithromax (Azithromycin) 18)  ! Cipro (Ciprofloxacin Hcl) 19)  ! Septra Ds (Sulfamethoxazole-Trimethoprim) 20)  ! Advair Diskus (Fluticasone-Salmeterol) 21)  ! Serevent Diskus (Salmeterol Xinafoate) 22)  ! Maxair Autohaler (Pirbuterol Acetate) 23)  ! Prilosec (Omeprazole) 24)  ! Cyproheptadine Hcl (Cyproheptadine Hcl) 25)  !  Percocet (Oxycodone-Acetaminophen) 26)  ! Cephalexin (Cephalexin) 27)  ! Diflucan (Fluconazole) 28)  ! Amantadine Hcl (Amantadine Hcl) 29)  ! Doxycycline Hyclate (Doxycycline Hyclate) 30)  ! Tussirex 31)  ! Prevacid (Lansoprazole) 32)  ! Singulair (Montelukast Sodium) 33)  ! Cleocin (Clindamycin Hcl) 34)  Ambien (Zolpidem Tartrate)  Review of Systems       The patient complains of fever, chest pain, dyspnea on exertion, and prolonged cough.  The patient denies weight loss.    Physical Exam  General:  NAD  Chronically ill-appearing and thin Nose:  Erythematous throat and intranasal mucosa c/w URI  Mouth:  Tongue with no thrush Lungs:  CTA with mild B ronchi Heart:  RRR Abdomen:   Bowel sounds positive,abdomen soft and non-tender without masses, organomegaly or hernias noted. Msk:  Using a cane  B ankles in a brace Extremities:  No edema Neurologic:  alert & oriented X3, abnormal gait, and ataxic gait.   Skin:  Intact without suspicious lesions or rashes Psych:  Oriented X3, memory intact for recent and remote, good eye contact,  less depressed appearing, not agitated, and slightly anxious. Not  tearful.     Impression & Recommendations:  Problem # 1:  BRONCHITIS (ICD-490) Assessment New  Her updated medication list for this problem includes:    Xopenex 1.25 Mg/13ml Nebu (Levalbuterol hcl) .[name]... 1 by mouth three times a day    Pulmicort 0.25 Mg/101ml Susp (Budesonide (inhalation)) .[name]... Three times a day    Combivent 103-18 Mcg/act Aero (Albuterol-ipratropium) .[name]... 2 inh qidas needed    Promethazine-codeine 6.25-10 Mg/26ml Syrp (Promethazine-codeine) .[name]... 5-10 ml by mouth q 6 hours as needed cough    Levaquin 500 Mg Tabs (Levofloxacin) .[name]... 1 by mouth qd  Problem # 2:  COPD (ICD-496) Assessment: Deteriorated  Her updated medication list for this problem includes:    Xopenex 1.25 Mg/11ml Nebu (Levalbuterol hcl) .[name]... 1 by mouth three times a day    Pulmicort 0.25 Mg/9ml Susp (Budesonide (inhalation)) .[name]... Three times a day    Combivent 103-18 Mcg/act Aero (Albuterol-ipratropium) .[name]... 2 inh qidas needed  Problem # 3:  HYPERTENSION (ICD-401.9) Assessment: Improved  Problem # 4:  GERD (ICD-530.81) Assessment: Unchanged  Her updated medication list for this problem includes:    Nexium 40 Mg Pack (Esomeprazole magnesium) .[name]... 1 by mouth two times a day  Problem # 5:  WEIGHT LOSS, ABNORMAL (ICD-783.21) Assessment: Improved  Complete Medication List: 1)  Nexium 40 Mg Pack (Esomeprazole magnesium) .[name].. 1 by mouth two times a day 2)  Dilantin 100 Mg Caps (Phenytoin sodium extended) .[name].. 1 in am and 1 in pm 3)  Klonopin 1 Mg Tabs (Clonazepam) .... 2   at  bedtime for restless legs and 1 in am as needed  for anxiety 4)  Flexeril 10 Mg Tabs (Cyclobenzaprine hcl) .[name].. 1 by mouth tid 5)  Xopenex 1.25 Mg/15ml Nebu (Levalbuterol hcl) .[name].. 1 by mouth three times a day 6)  Pulmicort 0.25 Mg/69ml Susp (Budesonide (inhalation)) .... Three times a day 7)  Cyproheptadine Hcl 4 Mg Tabs (Cyproheptadine hcl) .[name].. 1 by mouth once daily 8)  Combivent 103-18 Mcg/act Aero (Albuterol-ipratropium) .... 2 inh qidas needed 9)  Cobal-1000 1000 Mcg/ml Inj Soln (Cyanocobalamin) .... Administer 1cc im every 2 weeks 10)  Tylenol/codeine #3 300-30 Mg Tabs (Acetaminophen-codeine) .[name].. 1 by mouth qid prn 11)  Ditropan Xl 15 Mg Xr24h-tab (Oxybutynin chloride) .... Three times a day 12)  Promethazine-codeine 6.25-10 Mg/79ml Syrp (Promethazine-codeine) .... 5-10 ml  by mouth q 6 hours as needed cough 13)  Magic Mouthwash  .... 5 cc swish , hold and swallow qid 14)  Trazodone Hcl 50 Mg Tabs (Trazodone hcl) .... 3-4 by mouth at bedtime 15)  Lyrica 50 Mg Caps (Pregabalin) .[name].. 1 by mouth bid 16)  Cvs Vit D 5000 High-potency 5000 Unit Caps (Cholecalciferol) .[name].. 1 by mouth qd 17)  Bd Insulin Syringe 26g X 1/2" 1 Ml Misc (Insulin syringe-needle u-100) .... As dirr 18)  Pravachol 80 Mg Tabs (Pravastatin sodium) .[name].. 1 by mouth qd 19)  Levaquin 500 Mg Tabs (Levofloxacin) .[name].. 1 by mouth qd  Patient Instructions: 1)  Use over-the-counter medicines for "cold": Tylenol  650mg  or Advil 400mg  every 6 hours  for fever; Delsym  for cough. Ricola or Halls for sore throat. Office visit if not better or if worse.  2)  Please schedule a follow-up appointment in 3 months. Prescriptions: LEVAQUIN 500 MG TABS (LEVOFLOXACIN) 1 by mouth qd  #10 x 0   Entered and Authorized by:   Tresa Garter MD   Signed by:   Tresa Garter MD on 04/24/2010   Method used:   Electronically to        [facility] [phone]* (retail)       [address]       [place], [place]  [address]       Ph: [phone] or [phone]       Fax: [phone]   RxID:   [id number] PROMETHAZINE-CODEINE 6.25-10 MG/5ML SYRP (PROMETHAZINE-CODEINE) 5-10 ml by mouth q 6 hours as needed cough  #300 ml x 0   Entered and Authorized by:   Tresa Garter MD   Signed by:   Tresa Garter MD on 04/24/2010   Method used:   Print then Give to Patient   RxID:   [id number] LEVAQUIN 500 MG TABS (LEVOFLOXACIN) 1 by mouth qd  #10 x 0   Entered and Authorized by:   Tresa Garter MD   Signed by:   Tresa Garter MD on 04/24/2010   Method used:   Print then Give to Patient   RxID:   [id number]    Orders Added: 1)  Est. Patient Level IV [60630]

## 2010-07-04 ENCOUNTER — Other Ambulatory Visit: Payer: Self-pay | Admitting: Internal Medicine

## 2010-07-30 ENCOUNTER — Telehealth: Payer: Self-pay | Admitting: *Deleted

## 2010-07-30 NOTE — Telephone Encounter (Signed)
OK to fill this prescription with additional refills x0 Thank you!  

## 2010-07-30 NOTE — Telephone Encounter (Signed)
rec Rf req for prometh/codeine syrup. Take 5-10 ml every 6 hours prn cough. # . Last filled 06-14-09. Ok to Rf?

## 2010-07-31 MED ORDER — PHENYLEPH-PROMETHAZINE-COD 5-6.25-10 MG/5ML PO SYRP: 5.0000 mL | ORAL_SOLUTION | Freq: Four times a day (QID) | ORAL | Status: DC | PRN

## 2010-07-31 NOTE — Discharge Summary (Signed)
[place]. [facility]  Patient:    Veronica [name], Veronica [name] Visit Number: [id number] MRN: [id number]          Service Type: SUR Location: [id number] Attending Physician:  Luis Abed Dictated by:   Dionne Ano. Gwyneth Sprout., D.D.S. Admit Date:  02/21/2001 Discharge Date: 02/24/2001   CC:         Alexis Frock, M.D., [facility] 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  Her chief complaint was that she continued to have pain in both temporomandibular joints, particularly in the left side that was interfering with her ability to chew food and to take a normal dietary intake.  She had two arthrocenteses of the temporomandibular joint, splint therapy, and medical therapy for over six months and had little significant response.  Arthrograms of both temporomandibular joints showed meniscus displacement of the right temporomandibular joint without reduction in meniscus displacement of the left temporomandibular joint with intermediate reduction.  The patients history and physical was complicated by multiple medical problems including reactive airway disease, history of seizure disorder, status post left brain AV malformation, craniotomy, LATEX ALLERGY.  Multiple ALLERGIES to MEDICATIONS listed in the admission note.  Neurogenic bladder, migraine headaches, and history of depression.  MEDICATION LIST:  1. Combivent 2 puffs 4 times daily.  2. Flovent 110 mg b.i.d.  3. Dilantin 100 mg 2 in the morning and 1 in the evening.  4. Klonopin 0.5 mg h.s.  5. Ditropan 5 mg 3 times daily.  6. Pravachol 20 mg daily.  7. Desyrel 50 mg 3 times daily.  8. Calan SR 240 mg twice daily.  9. Estrace 1 mg  daily. 10. Provera 2.5 mg daily. 11. Aspirin 81 mg daily that had been discontinued one week prior to surgery. 12. Pepcid as needed.  For a complete review of the physical findings, see the admission note.  ADMISSION LABORATORY DATA:  Her EKG showed sinus bradycardia, otherwise within normal limits.  She was typed as 0+; antibody screen was negative.  WBC 7.5, RBC 4.78, hemoglobin 13.8, hematocrit 41.6, normal indices, normal differential.  BUN was low at 5.  SURGERY:  The patient was taken to surgery on the day of admission and under general anesthesia, bilateral temporomandibular joint meniscectomies with free auricular cartilage grafts were completed.  Meniscectomies were necessary due to the severe displacement of the temporomandibular joint cartilage and the malformation of the cartilage and the inability to reposition the cartilaginous tissue into the glenoid fossa.  HOSPITAL COURSE:  Postoperatively, the patient could not take food the first day.  The second day she developed worsening of severe nausea and vomiting requiring Phenergan every 4 hours intravenously.  Her nausea has subsided along with a decrease in pain level.  She has moderate facial swelling.  No seventh facial nerve paralysis and an incisional opening of approximately 15 mm.  Her lungs have remained clear, and reactive airway disease has been well-controlled, and she is now discharged in stable condition to be followed as an outpatient in our office for her temporomandibular joint and be followed by her physician, Dr. Alexis Frock at [facility].  Home care instructions have been reviewed with the patient including dietary instructions, return to normal medications.  She will  be continued on Medrol dosepak for 7 days in a declining dose and will continue on Phenergan 25 mg every 4-6 hours as needed for nausea and Dilaudid 2 mg every 4 hours as needed for pain.  FINAL DIAGNOSES: 1. 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 2. Reactive airway disease, stable. 3. Seizure disorder, stable. 4. Neurogenic bladder, stable. Dictated by:   Dionne Ano. Gwyneth Sprout., D.D.S. Attending Physician:  Luis Abed DD:  02/24/01 TD:  02/24/01 Job: [id number]

## 2010-07-31 NOTE — Discharge Summary (Signed)
Veronica [name], Veronica [name]                          ACCOUNT NO.:  [id number]   MEDICAL RECORD NO.:  [id number]                   PATIENT TYPE:  INP   LOCATION:  0470                                 FACILITY:  [facility]   PHYSICIAN:  Sandria Bales. Ezzard Standing, M.D.               DATE OF BIRTH:  17-Feb-1951   DATE OF ADMISSION:  09/25/2001  DATE OF DISCHARGE:  10/01/2001                                 DISCHARGE SUMMARY   OPERATION:  The patient had a laparoscopic cholecystectomy with  intraoperative cholangiogram on 09/27/01.   FINAL DIAGNOSES:  1. Chronic cholecystitis.  2. Abdominal pain, probable biliary in etiology.  3. 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  4. Hypercholesterolemia.  5. History of migraine headaches.  6. History of neutropenic bladder.  7. Hypertension.  8. Bronchitis.  9. History of diverticulosis.  10.      Status post an reflux operation for gastroesophageal reflux     disease.   OPERATION:  The patient had a laparoscopic cholecystectomy with  intraoperative cholangiogram on 09/27/01.   HISTORY OF PRESENT ILLNESS:  The patient is a 60 year old white female who  usually is seen in [place] for her primary health care at [facility] by Dr. Kateri Plummer.  She has a niece who is married to  Dr. Charlton Haws, and she is at this time seeing Dr. Posey Rea through  [facility].   Her symptoms go back about a week prior to admission when she developed some  right back pain, flank pain, and right upper quadrant pain.  This was felt  to be a possible urinary tract infection, she was treated with Levaquin.  However, the course of Levaquin did not improve her symptoms, so she then  saw Dr. Posey Rea on 09/25/01.  He admitted her to the hospital and had  scheduled her for a CT scan.   PAST GASTROINTESTINAL HISTORY:  An open Nissen fundoplication for reflux  disease by Dr.  Rolene Course and Dr. Abbey Chatters in 1999.  She thinks the wrap was  too tight, but otherwise has done well.  She has no liver disease,  pancreatic disease.  She does have a history of diverticular disease, and a  colonoscopy by Dr. Juanda Chance in 2000.   ALLERGIES:  PENICILLIN, SULFA, REGLAN, TETRACYCLINE, ERYTHROMYCIN.   MEDICATIONS:  1. Dilantin.  2. Estrace.  3. Provera.  4. Pravachol.  5. Klonopin.  6. Ditropan.  7. Aspirin.  8. K-Lan.  9. Desyrel.  10.      Tylenol p.r.n.  11.      Nexium p.r.n.   LABORATORY DATA:  White blood cell count of 7200, hemoglobin 13, hematocrit  39.  Amylase 694.   CT scan showed no obvious inflammatory mass.  She had a negative  hepatobiliary scan, but her symptoms did seem to be  in part consistent with  biliary disease.   HOSPITAL COURSE:  I talked with her at length.  She was also seen while she  was in the hospital by Dr. Arlyce Dice of the possibility of this being  gallbladder disease.  There is also a question raised of shingles, though  she never had a clear rash.  We all felt it was best if she would proceed  with cholecystectomy.  She proceeded to the operating room on 09/27/01, where  she underwent a laparoscopic cholecystectomy with intraoperative  cholangiogram.   Postoperatively, she did well.  Postoperatively, she had trouble with  constipation, but she takes laxatives and enemas regularly.  She had some  urinary retention which required some in-and-out catheterization.  Finally  on 10/01/01, she was doing well, she was given Tylenol as needed, Vicodin for  pain.  She was on a low fat diet.  Could shower.  She would see me back in  two weeks for followup.  She was probably going to use Dr. Posey Rea as her  primary care physician long-term, but she has yet to have decided that at  the time of discharge.   Her final pathology revealed chronic cholecystitis.  There is no mention of  stones.   CONDITION ON DISCHARGE:  Good.                                                Sandria Bales. Ezzard Standing, M.D.    DHN/MEDQ  D:  11/18/2001  T:  11/18/2001  Job:  [id number]   cc:   Audrea Muscat, M.D.  [facility]   Ron [name], M.D.  [facility] V. Plotnikov, M.D. [facility]   Dora M. Juanda Chance, M.D. [facility]   Theron Arista C. Eden Emms, M.D. [facility]

## 2010-07-31 NOTE — Op Note (Signed)
[name], [name]                ACCOUNT NO.:  [id number]   MEDICAL RECORD NO.:  [id number]          PATIENT TYPE:  AMB   LOCATION:  SDS                          FACILITY:  [facility]   PHYSICIAN:  Adolph Pollack, M.D.DATE OF BIRTH:  1950-08-13   DATE OF PROCEDURE:  12/10/2005  DATE OF DISCHARGE:  12/10/2005                                 OPERATIVE REPORT   PREOPERATIVE DIAGNOSES:  1. Right breast mass.  2. 1 cm right hip mole.   POSTOPERATIVE DIAGNOSES:  1. Right breast mass.  2. 1 cm right hip mole.   PROCEDURE:  1. Excision of her right hip mole.  2. Excision of right breast mass.   SURGEON:  Adolph Pollack, M.D.   ANESTHESIA:  MAC plus local (1% lidocaine with epinephrine plus 0.5% plain  Marcaine).   INDICATION:  Ms. Veronica [name] is a 60 year old female who has a right breast mass  and underwent a needle biopsy which demonstrated dense fibrosis.  However,  the mass has persisted and actually become larger.  She also has a right hip  mole that has changed color.  She now presents for removal of both.   TECHNIQUE:  She is seen in the holding area and the right breast area and  the right thigh were marked with my initials.  She was then brought to the  operating room, placed supine on the operating table and given intravenous  sedation.  The right lateral thigh area and the right breast was sterilely  prepped and draped.  Local anesthetic was infiltrated into the right thigh  area around the mole and an elliptical incision was made full-thickness  around the mole and it was excised sharply.  Bleeding was controlled with  electrocautery.  The wound was then closed with interrupted 3-0 Monocryl  subcuticular stitches.   Following this the right breast mass was approached.  It was in the 10-11  o'clock position in the subareolar area.  A circumareolar incision was made  from approximately the 6 o'clock to 12 o'clock area after infiltration of  local anesthesia  superficially and deep.  The mass was then grasped with  Allis forceps and excised using electrocautery.  The anterior margin of the  mass was marked with a single suture and the medial margin marked with  double sutures.  This was sent to pathology as well.   Following this, electrocautery was used to obtain hemostasis.  Once  hemostasis was adequate, the breast wound was then closed in two layers  closing the subcutaneous tissue with interrupted 3-0 Vicryl sutures and  closing the skin with a running 4-0 Monocryl subcuticular stitch.  Steri-  Strips and sterile dressing were applied to both wounds.   She tolerated the procedure well without any apparent complications and was  taken to the recovery room in satisfactory condition.      Adolph Pollack, M.D.  Electronically Signed     TJR/MEDQ  D:  12/10/2005  T:  12/12/2005  Job:  [id number]   cc:   Rolan Bucco L. Kearney Hard, M.D.  Georgina Quint. Plotnikov, MD  Tawanna Cooler  D. Meisinger, M.D.

## 2010-07-31 NOTE — H&P (Signed)
[facility]  Patient:    Veronica [name], Veronica [name] Visit Number: [id number] MRN: [id number]          Service Type: MED Location: [phone] Attending Physician:  Andre Lefort Dictated by:   Sonda Primes, M.D. [facility] Proc. Date: 09/25/01 Admit Date:  09/25/2001 Discharge Date: 10/01/2001   CC:         Hedwig Morton. Juanda Chance, M.D. [facility]  Adolph Pollack, M.D.   History and Physical  DATE OF BIRTH:  07-Jun-1950.  CHIEF COMPLAINT:  Right flank pain, abdominal pain.  HISTORY OF PRESENT ILLNESS:  The patient is a 60 year old female with multiple medical problems, who presented to the office with several days of worsening abdominal pain, distention, and right flank pain starting Wednesday.  She has been nauseated without vomiting, has not been able to eat or drink much.  The pain is currently 8/10 in intensity, diffuse abdominal.  There has been no diarrhea.  She reports some low-grade fever with chills.  She has been getting progressively worse.  PAST MEDICAL HISTORY:  Hyperlipidemia, history of CVA that followed brain surgery, depression, seizure disorder, status post fundoplasty for GERD, history of colon polyps.  ALLERGIES:  PENICILLIN, SULFA, REGLAN, TETRACYCLINE.  MEDICATIONS:  Dilantin 200 mg in the morning and 100 at night, Estrace, Provera Pravachol, Klonopin, Ditropan, aspirin 81 mg a day, Calan SR 240 mg a day, Desyrel 150 mg at night, Tylenol With Codeine p.r.n., Nexium daily.  SOCIAL HISTORY:  Does not smoke or drink alcohol.  She is on disability following her CVA.  She is married without children.  FAMILY HISTORY:  Her father died at pancreatic cancer at age of 46.  Mother is healthy.  One brother is living, sister had colon cancer.  REVIEW OF SYSTEMS:  Her gynecologist is Zenaida Niece, M.D.  Denies chest pain or shortness of breath.  Chronic disturbance of gait following CVA.  No syncope, no diarrhea, no blood in the stool, no  neurologic complaints other than mentioned above.  The rest is negative.  PHYSICAL EXAMINATION:  VITAL SIGNS:  Blood pressure 128/86, pulse 108, temperature 98.2, weight 103 pounds.  GENERAL:  She appears chronically ill.  She is in mild acute distress, appears somewhat dehydrated and toxic.  HEENT:  Dryish oral mucosa.  NECK:  Supple, no thyromegaly or bruit.  CHEST:  Lungs clear to auscultation and percussion, no wheezes or rales.  CARDIAC:  With S1, S2, normal to percussion.  ABDOMEN:  Distended, tender diffusely, more in the right upper quadrant. Murphys sign is slightly positive.  Rebound symptoms unclear.  Flanks slightly tender to palpation.  BREASTS:  Examination was not done.  EXTREMITIES:  Lower extremities without edema.  NEUROLOGIC:  She is alert, oriented, and cooperative.  SKIN:  With aging changes, dry.  LABORATORY DATA:  Not available.  ASSESSMENT AND PLAN: 1. Abdominal pain/right flank pain, severe.  Rule out intra-abdominal/    retroperitoneal pathology.  Obtain CBC, CMET, UA, lipase, amylase.  Obtain    abdominal CT scan.  Surgical consultation with Adolph Pollack, M.D. 2. Nausea due to problem #1.  Treat with Phenergan.  IV fluids. 3. Dehydration.  Will rehydrate IV. 4. History of arteriovenous malformation brain surgery resulting in    cerebrovascular accident in 1991. 5. History of seizure disorder.  May need to obtain Dilantin level. 6. History of diverticulosis.  No clear-cut evidence of diverticulitis at    present.  Will cover with antibiotics empirically. 7.  History of subclavian steal. 8. Gastroesophageal reflux disease.  Continue current therapy. 9. Hormone replacement therapy.  Will hold her Premarin at present. Dictated by:   Sonda Primes, M.D. [facility] Attending Physician:  Andre Lefort DD:  09/25/01 TD:  09/27/01 Job: [id number]

## 2010-07-31 NOTE — Consult Note (Signed)
[facility]  Patient:    Veronica [name], Veronica [name] Visit Number: [id number] MRN: [id number]          Service Type: MED Location: [phone] Attending Physician:  Tresa Garter Dictated by:   Sandria Bales. Ezzard Standing, M.D. Proc. Date: 09/25/01 Admit Date:  09/25/2001   CC:         Dr. Kateri Plummer, [facility]  Dr. Ma Rings, [facility]  Sonda Primes, M.D. [facility]  Dora M. Juanda Chance, M.D. [facility]  Theron Arista C. Eden Emms, M.D. [facility]   Consultation Report  DATE OF BIRTH:  September 20, 1950  REASON FOR CONSULTATION:  Abdominal pian.  HISTORY OF PRESENT ILLNESS:  [name] is a 60 year old white female who usually has her primary care over in [place] at [facility] by a Dr. Kateri Plummer.  Apparently her niece is married to Dr. Charlton Haws and this is how see got in to see Dr. Trinna Post Plotnikov today.  Her symptoms go back to last week.  On approximately September 22, 2001 she developed right back pain, flank pain and right upper quadrant pain.  She was diagnosed as best I can as over the phone as a possible urinary tract infection and was placed on Levaquin on Friday, September 22, 2001.  With the Levaquin over the weekend she has not gotten any better.  She saw Dr. Posey Rea today who has admitted her to the hospital and scheduled her for a CT scan.  Her prior GI history is that she had an open Nissen fundal plication for reflux disease by Dr. Rolene Course and Dr. Abbey Chatters in 1999. She says the wrap was "tight" but she has otherwise done well. She has not had an upper endoscopy since the wrap was placed.  She denies any liver disease, known gallbladder disease, or pancreatic disease.  She also has had diverticular disease and has had a colonoscopy by Dr. [name] approximately in year 2000.  Her sister had colon cancer apparently.  She has had no chronic GI complaints of intolerance to food.  She has had no chronic nausea or vomiting  though she says the wrap kind of prevents her from vomiting.  This has all come on acutely and chronically over the last five days.  ALLERGIES:  PENICILLIN, SULFA, REGLAN, TETRACYCLINE and ERYTHROMYCIN.  She says that all these cause a rash and some lead to trouble breathing.  She also can not take iodine but this was a long time ago in IV form.  ADMISSION MEDICATIONS:  1. Dilantin 200 mg in the morning and 100 mg at night.  2. Estrace 1 mg daily.  3. Provera 2.5 mg daily.  4. Pravachol 20 mg daily.  5. Klonopin 1 mg nightly.  6. Ditropan 5 mg q.h.s.  7. Aspirin 81 mg q.d.  8. Calan SR 240 mg q.d.  9. Desyrel 150 mg q.h.s. 10. Tylenol with codeine p.r.n. 11. Nexium 40 mg q.d.  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  She had some residual right sided weakness and also had a stroke approximately five months post surgery in 1991 which has left her with some moderate right sided weakness.  This was done by Dr. Al Decant at [place] in [place].  She has been on antiseizure medicine but according to her and her mother, she has not had a seizure in at least 10 or 11 years.  Since really all this surgery  happened.  PULMONARY:  She has no history of pneumonia but she does have bronchitis.  CARDIAC:  She has a hypercholesterolemia.  She also is treated for high blood pressure/hypertension.  She has had no chest pain. No cardiac evaluation.  GASTROINTESTINAL:  See history of present illness.  UROLOGIC:  She has frequency and incontinence of urine and a "neurogenic bladder,"  this is why she takes Ditropan.  GYN:  She has never been pregnant, has no children.  Her mother was in the room while I examined her.  She did have an ovarian cyst removed years ago with fibroid surgery and she has had hemorrhoid surgery.  PHYSICAL EXAMINATION:  VITAL SIGNS:  Her temperature was 98.6, pulse 84, respirations 16,  blood pressure 140/60.  HEENT:  Unremarkable.  She has no jaundice.  She looks a little dehydrated.  NECK:  Supple.  LUNGS: Clear to auscultation.  HEART:  Regular rate and rhythm without murmur or rub.  ABDOMEN:  Mildly distended.  She has decreased bowel sounds.  She is tender in her right flank, right upper quadrant into her right back. I do not feel any palpable mass.  The right side is more tender than the left side.  EXTREMITIES:  She has good strength in her left side greater than her right side.  Had some trouble getting IV access on her.  LABORATORY DATA:  That I have show a white blood count of 7400, hemoglobin 13, hematocrit 39, platelets 307,000.  Her sed rate is 30.  Her sodium 137, potassium 3.7, chloride 98, CO2 29, creatinine 0.7.  Her alkaline phosphatase is 111, bilirubin 0.6, total protein 7.2.  Amylase is 94, lipase 24.  IMPRESSION: 1. Right flank, right upper quadrant pain.  Questionable rather the source    is renal versus gallbladder versus duodenal disease or gastrointestinal    disease.  She has a computed tomography scan planned for later this    evening and will follow up post computed tomography scan. 2. Status post arteriovenous malformation surgery in 1991 with a stroke and    left residual right sided weakness. 3. Hypercholesterolemia. 4. History of migraine headaches. 5. Neurogenic bladder on Ditropan. 6. History of hypertension. 7. History of bronchitis. 8. History of diverticulosis. 9. Status post antireflux operation for gastroesophageal reflux disease. Dictated by:   Sandria Bales. Ezzard Standing, M.D. Attending Physician:  Tresa Garter DD:  09/25/01 TD:  09/25/01 Job: [id number]

## 2010-07-31 NOTE — Op Note (Signed)
[place]  Patient:    Veronica, [name] Visit Number: [id number] MRN: [id number]          Service Type: SUR Location: [phone] Attending Physician:  Luis Abed Dictated by:   Dionne Ano. Gwyneth Sprout., D.D.S. Admit Date:  02/21/2001                             Operative Report  PREOPERATIVE DIAGNOSES: 1. 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 2. Internal derangement of the right temporomandibular joint with meniscus    dislocation and without reduction.  POSTOPERATIVE DIAGNOSES: 1. 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 2. Internal derangement of the right temporomandibular joint with meniscus    dislocation and without reduction. 3. Deformed and displaced bilateral temporomandibular joint meniscus without    reduction.  PROCEDURE:  Bilateral temporomandibular joint meniscectomy with free auricular cartilage graft.  SURGEONS: Dionne Ano. Gwyneth Sprout., D.D.S., and Lovena Le, D.D.S.  INDICATION FOR PROCEDURE:  This 60 year old female has been plagued with a long history of temporomandibular joint pain, restricted jaw opening, and inability to chew food.  She has had minimal response to conservative treatment.  She has had bilateral temporomandibular joint arthrocentesis procedures with minimal effect.  She continues to wear a _____ splint but continues to have significant pain and restricted opening with restricted chewing.  Bilateral temporomandibular joint arthrograms were completed and showed displacement of the cartilage on the left side anteriorly with intermediate reduction and displacement of cartilage on the right side without reduction.  The risks and the complications of the procedure were explained to the patient with a goal to increase her range of motion,  decrease her pain level, and return to relatively normal diet after the healing process.  PROCEDURE AND FINDINGS:  Patient brought to the operating suite and placed in the supine position on the operating table.  An orotracheal intubation was completed without complication.  Because the patient has latex allergies, all the latex modifications were made, and non-latex gloves were used by all the surgical scrub team.  After satisfactory anesthesia was achieved, the head was turned to the right side, exposing the left preauricular area.  A cotton pledget was placed in the external auditory canal and the left preauricular and postauricular areas prepped with Betadine scrub with Betadine pain.  The patient was draped with a Vi-Drape and a split sheet and a head sheet.  The left preauricular area was infiltrated with 0.5% Marcaine with 1:200,000 epinephrine just anterior to the external ear, and then in the small crease just anterior to the ear, a vertical reverse hockey stick incision was made from the helix of the ear to the tragus of the ear.  The incision was carried through the skin and subcutaneous tissue down to the superficial layer of temporalis fascia and undermined.  Bleeding was controlled with electrocautery and the Shaw scalpel blade.  The area overlying the zygomatic arch was identified and a clamp was placed on the angle of the mandible to manipulate the mandible and distract the condyle inferiorly.  Then using a Shaw scalpel blade set at 150 degrees, an incision was made through the superficial layer of temporalis fascia and the temporalis muscle fibers down to the zygomatic arch approximately 1 cm posterior to the glenoid fossa.  The incision was  then undermined and dissected in the area overlying the condyle, and this tissue was reflected forward.  This exposed the lateral capsular ligament of the temporomandibular joint.  The superior joint space was insufflated with  1% Marcaine with 1:200,000 epinephrine and then the lateral attachment of the temporomandibular joint was cut from the zygomatic arch and entered into the superior joint space.  This gave good visualization of the cartilage and the posterior attachment of the temporomandibular joint with the mandible distracted inferiorly, and it could be seen that there was no cartilage overlying the condylar head.  All the cartilage had been displaced anteriorly. There was extreme thinning of the cartilage with a possible perforation.  The inferior joint space was entered by dissection to the lateral attachment of the meniscus, and the inferior joint space was then distracted.  There were no significant osteophytes, and the condylar head appeared to be relatively smooth.  A DeBakey clamp was placed on the posterior attachment, and then a Beaver blade was used to remove the cartilage.  Previous attempts to free up the cartilage were unsuccessful, and it was felt that the cartilage could not be removed to its normal anatomical position.  After excision of the cartilage, it was submitted to pathology for histologic examination.  Attention was then carried out to the posterior auricular area to harvest a cartilage graft.  The ear was sewn forward, and from the posterior portion of the ear along the convex surface approximately 1 cm from the border of the ear, an incision was made through the skin and subcutaneous tissue and down to the cartilage.  Blunt and sharp dissection were used to expose the entire bowl of cartilage in the ear and an incision was made through the cartilage, and the subcutaneous tissue was dissected free from the anterior portion of the ear and a section of cartilage of the ear approximately 4 x 5 cm was harvested with the concave-convex surface.  The ear was thoroughly irrigated.  Hemorrhage was controlled with electrocautery, and the incision was closed with multiple mattress  sutures using 4-0 chromic suture, and subcutaneous layer was closed with 4-0 chromic sutures.  The skin incision was then closed with 5-0 nylon suture.   The ear was returned to the normal anatomic position and the harvested cartilage was then placed in the superior joint space in between the condyle and the rest of the glenoid fossa and fit snugly into position.  The condyle could be manipulated and moved freely over the cartilaginous tissue.  The cartilage was sewn into position along the lateral capsular ligament and lateral attachment.  The joint space was closed in layers, closing the lateral capsular ligament, then the superficial layer of temporalis fascia and subcutaneous tissue and the skin using 4-0 Vicryl suture and the skin using 5-0 nylon subcuticular tissue and a running baseball suture for the final skin closure.  The head was turned to the left side, and a similar procedure was used to complete a meniscectomy with a free auricular cartilage graft of the right temporomandibular joint.  Similar findings were noted that the cartilage was significantly displaced anteriorly even with manipulation and _____ of the anterior ligament and anterior attachment, the cartilage could not be reduced in its normal anatomical position, and it showed extreme thinning of the cartilage and evidence of early perforation.  Both procedures went without complication.  The patient was awakened in the operating room and extubated in the operating room.  It was also noted that she  had no urinary output during the surgical procedure, and there was some question of whether the Foley catheter had been inserted correctly.  The patient has also been on Ditropan for neurogenic bladder, and we will monitor her for intake and monitor her for output.  Tissue removed is temporomandibular joint cartilage and meniscus from both the left and right temporomandibular joints, submitted for histologic  examination.  Estimated blood loss is minimal, less than 50 cc.  Complications:  None. Dictated by:   Dionne Ano. Gwyneth Sprout., D.D.S. Attending Physician:  Luis Abed DD:  02/21/01 TD:  02/21/01 Job: [id number]

## 2010-07-31 NOTE — Op Note (Signed)
[place]  Patient:    Veronica [name], Veronica [name] Visit Number: [id number] MRN: [id number]          Service Type: MED Location: [phone] Attending Physician:  Andre Lefort Dictated by:   Sandria Bales. Ezzard Standing, M.D. Proc. Date: 09/27/01 Admit Date:  09/25/2001 Discharge Date: 10/01/2001   CC:         Dr. Kateri Plummer, [facility]  Dr. Daron Offer, [facility]  Sonda Primes, M.D. [facility]  Titus Dubin. Alwyn Ren, M.D. [facility]  Molly Maduro D. Arlyce Dice, M.D. 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  POSTOPERATIVE DIAGNOSIS:  Chronic cholecystitis with biliary sludge.  OPERATION PERFORMED:  Laparoscopic cholecystectomy with intraoperative cholangiogram.  SURGEON:  Sandria Bales. Ezzard Standing, M.D.  ASSISTANT:  Currie Paris, M.D.  ANESTHESIA:  General endotracheal.  ESTIMATED BLOOD LOSS:  Minimal.  INDICATIONS FOR PROCEDURE:  Veronica [name] is a 60 year old white female who is a patient of Dr. Kateri Plummer at [facility] in [place]. She presented to the hospital and was admitted on September 25, 2001 with about a five-day history of right back, flank and abdominal pain.  Her evaluation here has been essentially unremarkable with a CT scan of her abdomen negative, white blood cell count normal and liver functions normal.  An ultrasound of her abdomen showed a thickened gallbladder wall with gallbladder sludge. Hepatobiliary scan was normal.  The only abnormal findings so far for this persistent right-sided abdominal pain is an ultrasound suggesting some biliary changes.  Discussion carried out with the patient about proceeding with attempted laparoscopic cholecystectomy. This may or may not take care of her pain.  It may or may not be the source of her admission.  I discussed the indications for gallbladder surgery, potential complications including but  not limited to infection bleeding, bowel injury, bile duct injury and open surgery.  Because of her prior open Nissen fundoplication, I told her the risks of open surgery were somewhat higher than the average persons.  She presents to the operating room for laparoscopic cholecystectomy.  DESCRIPTION OF PROCEDURE:  The patient was placed in the supine position and given a general endotracheal anesthetic.  Her abdomen was prepped with Betadine solution and sterilely draped.  She was on Tequin as an IV antibiotic.  She had PAS stockings in place.  Her abdomen was prepped with Betadine solution and sterilely draped. An infraumbilical incision was made with sharp dissection carried down to the abdominal cavity.  A 0 degree laparoscope was inserted through a 12 mm Hasson trocar and the Hasson trocar was secured with a 0 Vicryl suture.  Two or three additional trocars were placed with a 10 mm Ethicon trocar in the subxiphoid location and a 5 mm Ethicon trocar in the right midsubcostal location and 5 mm Ethicon trocar in the right lateral subcostal location.  I then looked back down her lower abdomen.  What she has was basic adhesions of omentum right down the midline but not off to the side of her ____________.  I did have a good working area in the left upper quadrant.  From what I could see of the left lobe of her liver was unremarkable.  The right lobe of the liver she had about a 2 to 3 cm hemangioma hanging off the very inferior aspect of the lateral right lobe.  Her gallbladder had some adhesions but I think  these adhesions were at least in part from her prior upper abdominal surgery.  These were taken down with sharp and blunt dissection.  The liver and duodenum and stomach of which I could see was all otherwise unremarkable.  The gallbladder was grasped with two graspers, rotated cephalad.  Sharp dissection carried down identifying the cystic artery which was triply endoclipped and the  cystic duct which was clipped, placed on the gallbladder side of the cystic duct, and intraoperative cholangiogram was obtained.  The cut taut catheter was inserted through the 14 gauge Jelco into the cystic duct and the cystic duct injected with half strength radiopaque solution which was hypoallergenic.  The patient does give a history of _______ injection before of an IV dye, therefore we used the hypoallergic contrast.  This was injected under fluoroscopy visualizing the cystic duct, common bile duct and duodenum.  This flowed promptly into the common bile duct and refluxed up into the liver and it was all unremarkable.  This was felt to be a normal intraoperative cholangiogram.  The taut catheter was then removed. The cystic duct was triply Endoclipped and divided.  The gallbladder was then sharply and bluntly dissected from the gallbladder bed.  Before dividing the gallbladder completely from the gallbladder bed, the triangle of Calot and the gallbladder bed were visualized.  There was no bleeding and no bile leak.  The gallbladder was placed in an Endocatch bag and delivered through the umbilicus.  I then irrigated the abdomen with about 500 cc of saline and there was no bleeding or bile leak.  The umbilical port was closed with a 0 Vicryl suture.  Each port site was infiltrated with 0.25% Marcaine using about 14 cc total.  The skin at each site was closed with 4-0 Vicryl suture painted with tincture of benzoin, Steri-Strips and sterilely dressed.  The patient tolerated the procedure well and was transported to the recovery room in good condition.  The sponge and needle counts were correct at the end of the case.  Dictated by:   Sandria Bales. Ezzard Standing, M.D. Attending Physician:  Andre Lefort DD:  09/27/01 TD:  10/02/01 Job: [id number]

## 2010-07-31 NOTE — Op Note (Signed)
Veronica [name], Veronica [name]                         ACCOUNT NO.:  [id number]   MEDICAL RECORD NO.:  [id number]                   PATIENT TYPE:  AMB   LOCATION:  ENDO                                 FACILITY:  [facility]   PHYSICIAN:  Hedwig Morton. Juanda Chance, M.D. [facility]            DATE OF BIRTH:  01/30/1951   DATE OF PROCEDURE:  11/09/2001  DATE OF DISCHARGE:                                 OPERATIVE REPORT   PROCEDURE:  Upper endoscopy with esophageal dilation.   INDICATIONS FOR PROCEDURE:  This 60 year old white female with a recent  history of cerebrovascular accident is complaining of recent onset of solid  food dysphagia.  She had esophageal dilatation elsewhere in 1995.  She also  has been evaluated for chest pain which is being attributed to esophageal  spasm.  She has been on Nexium 40 mg q.d. for four weeks with only minimal  improvement of her symptoms.  She is now undergoing upper endoscopy to  assess her for esophageal stricture and to proceed with dilatation if  indicated.   ENDOSCOPE:  Olympus single chamber video endoscope.   SEDATION:  1. Versed 7 mg IV.  2. Demerol 50 mg IV.   FINDINGS:  The Olympus video endoscope was passed into __________ through  the posterior pharynx into the esophagus.  The patient was monitored by  pulse oximeter.  Oxygen saturations were normal.  She was __________ .  Proximal and distal esophageal mucosa was unremarkable.  There was no  evidence of esophagitis.  There was no esophageal stricture.  The endoscope  was traversed through into the stomach without difficulty.  Esophagus did  not appear to be tortuous, but it was difficult to assess as to the  effectiveness of the peristolic wave.   The stomach was insufflated with air and showed diffuse erythema in the  gastric antrum and the body of the stomach.  Biopsies were taken for CLO  test.  There was also a moderate amount of bilious amount around the greater  curvature of the stomach.   Duodenal  bulb, and descending duodenum were normal.  A guide wire was then  placed into the stomach under fluoroscopic guidance.  A 14 mm, 16 mm, and 18  mm Savory dilators were passed over the guide wire.  There was resistant  with the last dilator, but no blood.  The patient tolerated the procedure  well.   IMPRESSION:  1. Status post passage of 18 mm Savory dilators.  2. Gastritis, status post CLO test.  3. Bowel reflux, no evidence for esophageal stricture.   PLAN:  1. Observe the patient for any recurrence of the dysphagia.  If it     continues, I suspect we are dealing with     esophageal motility disorder and esophageal spasm.  This could be     evaluated with either __________ esophogram or esophageal minometry.  2. The patient is to stay  on Nexium 40 mg q.d. for now.                                               Hedwig Morton. Juanda Chance, M.D. [facility]    DMB/MEDQ  D:  11/09/2001  T:  11/09/2001  Job:  [id number]

## 2010-08-02 ENCOUNTER — Other Ambulatory Visit: Payer: Self-pay | Admitting: Internal Medicine

## 2010-08-05 ENCOUNTER — Other Ambulatory Visit: Payer: Self-pay | Admitting: Internal Medicine

## 2010-08-06 ENCOUNTER — Other Ambulatory Visit: Payer: Self-pay | Admitting: *Deleted

## 2010-08-06 MED ORDER — IPRATROPIUM-ALBUTEROL 18-103 MCG/ACT IN AERO: 2.0000 | INHALATION_SPRAY | Freq: Four times a day (QID) | RESPIRATORY_TRACT | Status: DC

## 2010-08-28 ENCOUNTER — Other Ambulatory Visit (INDEPENDENT_AMBULATORY_CARE_PROVIDER_SITE_OTHER): Payer: BC Managed Care – PPO

## 2010-08-28 ENCOUNTER — Ambulatory Visit (INDEPENDENT_AMBULATORY_CARE_PROVIDER_SITE_OTHER): Payer: Medicare Other | Admitting: Internal Medicine

## 2010-08-28 ENCOUNTER — Encounter: Payer: Self-pay | Admitting: Internal Medicine

## 2010-08-28 DIAGNOSIS — E785 Hyperlipidemia, unspecified: Secondary | ICD-10-CM

## 2010-08-28 DIAGNOSIS — E559 Vitamin D deficiency, unspecified: Secondary | ICD-10-CM

## 2010-08-28 DIAGNOSIS — I10 Essential (primary) hypertension: Secondary | ICD-10-CM

## 2010-08-28 DIAGNOSIS — E538 Deficiency of other specified B group vitamins: Secondary | ICD-10-CM

## 2010-08-28 LAB — URINALYSIS
Bilirubin Urine: NEGATIVE
Hgb urine dipstick: NEGATIVE
Nitrite: NEGATIVE
Total Protein, Urine: NEGATIVE
Urine Glucose: NEGATIVE

## 2010-08-28 LAB — COMPREHENSIVE METABOLIC PANEL
ALT: 17 U/L (ref 0–35)
AST: 21 U/L (ref 0–37)
Alkaline Phosphatase: 94 U/L (ref 39–117)
Glucose, Bld: 86 mg/dL (ref 70–99)
Sodium: 138 mEq/L (ref 135–145)
Total Bilirubin: 0.6 mg/dL (ref 0.3–1.2)
Total Protein: 7.3 g/dL (ref 6.0–8.3)

## 2010-08-28 LAB — CBC WITH DIFFERENTIAL/PLATELET
Eosinophils Relative: 0.2 % (ref 0.0–5.0)
HCT: 42.3 % (ref 36.0–46.0)
Lymphs Abs: 2.3 10*3/uL (ref 0.7–4.0)
MCV: 88.9 fl (ref 78.0–100.0)
Monocytes Absolute: 0.9 10*3/uL (ref 0.1–1.0)
Platelets: 255 10*3/uL (ref 150.0–400.0)
WBC: 7.4 10*3/uL (ref 4.5–10.5)

## 2010-08-28 LAB — TSH: TSH: 1.81 u[IU]/mL (ref 0.35–5.50)

## 2010-08-28 MED ORDER — "INSULIN SYRINGE-NEEDLE U-100 26G X 1/2"" 1 ML MISC": 1.0000 | Status: DC

## 2010-08-28 MED ORDER — CYANOCOBALAMIN 1000 MCG/ML IJ SOLN: 1000.0000 ug | INTRAMUSCULAR | Status: DC

## 2010-08-28 NOTE — Assessment & Plan Note (Signed)
On injections q 2 wks

## 2010-08-28 NOTE — Progress Notes (Signed)
  Subjective:    Patient ID: Veronica [name], female    DOB: Apr 21, 1950, 60 y.o.   MRN: [id number]  HPI  The patient is here to follow up on chronic asthma, depression, anxiety, headaches and chronic moderate fibromyalgia, seizures symptoms controlled with medicines, diet and exercise.    Review of Systems  Constitutional: Negative for chills, activity change, appetite change, fatigue and unexpected weight change.  HENT: Negative for congestion, mouth sores and sinus pressure.   Eyes: Negative for visual disturbance.  Respiratory: Positive for cough and shortness of breath (chronic). Negative for chest tightness.   Cardiovascular: Negative for chest pain and leg swelling.  Gastrointestinal: Negative for nausea and abdominal pain.  Genitourinary: Negative for frequency, difficulty urinating and vaginal pain.  Musculoskeletal: Positive for arthralgias and gait problem. Negative for back pain.  Skin: Negative for pallor and rash.  Neurological: Negative for dizziness, tremors, weakness, numbness and headaches.  Psychiatric/Behavioral: Negative for confusion and sleep disturbance.       Objective:   Physical Exam  Constitutional: She appears well-developed and well-nourished. No distress.       In NAD  HENT:  Head: Normocephalic.  Right Ear: External ear normal.  Left Ear: External ear normal.  Nose: Nose normal.  Mouth/Throat: Oropharynx is clear and moist.  Eyes: Conjunctivae are normal. Pupils are equal, round, and reactive to light. Right eye exhibits no discharge. Left eye exhibits no discharge.  Neck: Normal range of motion. Neck supple. No JVD present. No tracheal deviation present. No thyromegaly present.  Cardiovascular: Normal rate, regular rhythm and normal heart sounds.   Pulmonary/Chest: No stridor. No respiratory distress. She has no wheezes.  Abdominal: Soft. Bowel sounds are normal. She exhibits no distension and no mass. There is no tenderness. There is no rebound and  no guarding.  Musculoskeletal: She exhibits no edema and no tenderness.       Can R and L LEs are in a brace  Lymphadenopathy:    She has no cervical adenopathy.  Neurological: She displays normal reflexes. A cranial nerve deficit is present. She exhibits normal muscle tone. Coordination normal.       R facial droop  Skin: No rash noted. No erythema.  Psychiatric: She has a normal mood and affect. Her behavior is normal. Judgment and thought content normal.          Assessment & Plan:

## 2010-08-28 NOTE — Assessment & Plan Note (Signed)
On Rx 

## 2010-08-28 NOTE — Assessment & Plan Note (Signed)
On Pravachol and fish oil

## 2010-08-30 ENCOUNTER — Other Ambulatory Visit: Payer: Self-pay | Admitting: Internal Medicine

## 2010-08-30 ENCOUNTER — Emergency Department (HOSPITAL_COMMUNITY): Payer: BC Managed Care – PPO

## 2010-08-30 ENCOUNTER — Emergency Department (HOSPITAL_COMMUNITY)
Admission: EM | Admit: 2010-08-30 | Discharge: 2010-08-31 | Disposition: A | Discharge status: Home / Self Care | Payer: BC Managed Care – PPO | Attending: Emergency Medicine | Admitting: Emergency Medicine

## 2010-08-30 DIAGNOSIS — K219 Gastro-esophageal reflux disease without esophagitis: Secondary | ICD-10-CM | POA: Insufficient documentation

## 2010-08-30 DIAGNOSIS — R110 Nausea: Secondary | ICD-10-CM | POA: Insufficient documentation

## 2010-08-30 DIAGNOSIS — R1915 Other abnormal bowel sounds: Secondary | ICD-10-CM | POA: Insufficient documentation

## 2010-08-30 DIAGNOSIS — Z79899 Other long term (current) drug therapy: Secondary | ICD-10-CM | POA: Insufficient documentation

## 2010-08-30 DIAGNOSIS — R1012 Left upper quadrant pain: Secondary | ICD-10-CM | POA: Insufficient documentation

## 2010-08-30 LAB — COMPREHENSIVE METABOLIC PANEL
AST: 46 U/L — ABNORMAL HIGH (ref 0–37)
Albumin: 3.5 g/dL (ref 3.5–5.2)
Alkaline Phosphatase: 118 U/L — ABNORMAL HIGH (ref 39–117)
BUN: 16 mg/dL (ref 6–23)
Chloride: 101 mEq/L (ref 96–112)
Potassium: 3.8 mEq/L (ref 3.5–5.1)
Total Bilirubin: 0.1 mg/dL — ABNORMAL LOW (ref 0.3–1.2)

## 2010-08-30 LAB — CBC
HCT: 39.7 % (ref 36.0–46.0)
Platelets: 276 10*3/uL (ref 150–400)
RDW: 14.1 % (ref 11.5–15.5)
WBC: 8.8 10*3/uL (ref 4.0–10.5)

## 2010-08-31 ENCOUNTER — Telehealth: Payer: Self-pay | Admitting: Internal Medicine

## 2010-08-31 ENCOUNTER — Telehealth: Payer: Self-pay | Admitting: *Deleted

## 2010-08-31 LAB — DIFFERENTIAL
Eosinophils Relative: 1 % (ref 0–5)
Lymphocytes Relative: 30 % (ref 12–46)
Monocytes Absolute: 1.1 10*3/uL — ABNORMAL HIGH (ref 0.1–1.0)
Neutrophils Relative %: 56 % (ref 43–77)

## 2010-08-31 MED ORDER — SUCRALFATE 1 GM/10ML PO SUSP: ORAL | Status: DC

## 2010-08-31 MED ORDER — IOHEXOL 300 MG/ML  SOLN
80.0000 mL | Freq: Once | INTRAMUSCULAR | Status: AC | PRN
  Administered 2010-08-31: 80 mL via INTRAVENOUS

## 2010-08-31 NOTE — Telephone Encounter (Signed)
Stacey, please, inform patient that all labs are OK Thx   

## 2010-08-31 NOTE — Telephone Encounter (Signed)
Per Dr Juanda Chance, patient needs carafate sent to pharmacy. Rx sent.

## 2010-09-01 ENCOUNTER — Telehealth: Payer: Self-pay | Admitting: Internal Medicine

## 2010-09-01 ENCOUNTER — Encounter: Payer: Self-pay | Admitting: Internal Medicine

## 2010-09-01 NOTE — Telephone Encounter (Signed)
Veronica [name] , please, inform the patient: labs are OK   Please, keep  next office visit appointment.   Thank you !   

## 2010-09-01 NOTE — Telephone Encounter (Signed)
Pt informed. Copies mailed

## 2010-09-02 ENCOUNTER — Inpatient Hospital Stay (HOSPITAL_COMMUNITY): Payer: BC Managed Care – PPO

## 2010-09-02 ENCOUNTER — Encounter: Payer: Self-pay | Admitting: Internal Medicine

## 2010-09-02 ENCOUNTER — Inpatient Hospital Stay (HOSPITAL_COMMUNITY)
Admission: AD | Admit: 2010-09-02 | Discharge: 2010-09-05 | DRG: 182 | Disposition: A | Discharge status: Home / Self Care | Payer: BC Managed Care – PPO | Source: Ambulatory Visit | Attending: Internal Medicine | Admitting: Internal Medicine

## 2010-09-02 ENCOUNTER — Ambulatory Visit (HOSPITAL_COMMUNITY): Payer: BC Managed Care – PPO | Admitting: Internal Medicine

## 2010-09-02 VITALS — BP 110/64 | HR 80 | Temp 98.0°F | Ht 63.0 in | Wt 94.0 lb

## 2010-09-02 DIAGNOSIS — K219 Gastro-esophageal reflux disease without esophagitis: Secondary | ICD-10-CM | POA: Diagnosis present

## 2010-09-02 DIAGNOSIS — E860 Dehydration: Secondary | ICD-10-CM

## 2010-09-02 DIAGNOSIS — E538 Deficiency of other specified B group vitamins: Secondary | ICD-10-CM | POA: Diagnosis present

## 2010-09-02 DIAGNOSIS — E785 Hyperlipidemia, unspecified: Secondary | ICD-10-CM | POA: Diagnosis present

## 2010-09-02 DIAGNOSIS — M3500 Sicca syndrome, unspecified: Secondary | ICD-10-CM | POA: Diagnosis present

## 2010-09-02 DIAGNOSIS — K2960 Other gastritis without bleeding: Principal | ICD-10-CM | POA: Diagnosis present

## 2010-09-02 DIAGNOSIS — F3289 Other specified depressive episodes: Secondary | ICD-10-CM | POA: Diagnosis present

## 2010-09-02 DIAGNOSIS — Z860100 Personal history of colon polyps, unspecified: Secondary | ICD-10-CM

## 2010-09-02 DIAGNOSIS — J45909 Unspecified asthma, uncomplicated: Secondary | ICD-10-CM | POA: Diagnosis present

## 2010-09-02 DIAGNOSIS — G43909 Migraine, unspecified, not intractable, without status migrainosus: Secondary | ICD-10-CM | POA: Diagnosis present

## 2010-09-02 DIAGNOSIS — R112 Nausea with vomiting, unspecified: Secondary | ICD-10-CM

## 2010-09-02 DIAGNOSIS — M1990 Unspecified osteoarthritis, unspecified site: Secondary | ICD-10-CM | POA: Diagnosis present

## 2010-09-02 DIAGNOSIS — G40909 Epilepsy, unspecified, not intractable, without status epilepticus: Secondary | ICD-10-CM | POA: Diagnosis present

## 2010-09-02 DIAGNOSIS — F329 Major depressive disorder, single episode, unspecified: Secondary | ICD-10-CM | POA: Diagnosis present

## 2010-09-02 DIAGNOSIS — I10 Essential (primary) hypertension: Secondary | ICD-10-CM | POA: Diagnosis present

## 2010-09-02 DIAGNOSIS — K5730 Diverticulosis of large intestine without perforation or abscess without bleeding: Secondary | ICD-10-CM | POA: Diagnosis present

## 2010-09-02 DIAGNOSIS — I69959 Hemiplegia and hemiparesis following unspecified cerebrovascular disease affecting unspecified side: Secondary | ICD-10-CM

## 2010-09-02 DIAGNOSIS — E559 Vitamin D deficiency, unspecified: Secondary | ICD-10-CM | POA: Diagnosis present

## 2010-09-02 DIAGNOSIS — R1013 Epigastric pain: Secondary | ICD-10-CM

## 2010-09-02 LAB — COMPREHENSIVE METABOLIC PANEL
Albumin: 3.9 g/dL (ref 3.5–5.2)
BUN: 11 mg/dL (ref 6–23)
Creatinine, Ser: 0.67 mg/dL (ref 0.50–1.10)
Total Bilirubin: 0.3 mg/dL (ref 0.3–1.2)
Total Protein: 6.6 g/dL (ref 6.0–8.3)

## 2010-09-02 LAB — LIPASE, BLOOD: Lipase: 18 U/L (ref 11–59)

## 2010-09-02 LAB — URINALYSIS, MICROSCOPIC ONLY
Bilirubin Urine: NEGATIVE
Nitrite: NEGATIVE
Protein, ur: NEGATIVE mg/dL
Specific Gravity, Urine: 1.013 (ref 1.005–1.030)
Urobilinogen, UA: 0.2 mg/dL (ref 0.0–1.0)

## 2010-09-02 LAB — AMYLASE: Amylase: 81 U/L (ref 0–105)

## 2010-09-02 LAB — CBC
HCT: 41.7 % (ref 36.0–46.0)
MCH: 28.9 pg (ref 26.0–34.0)
MCHC: 33.3 g/dL (ref 30.0–36.0)
MCV: 86.7 fL (ref 78.0–100.0)
RDW: 14 % (ref 11.5–15.5)

## 2010-09-02 NOTE — Progress Notes (Signed)
Veronica [name] [birth year] MRN [id number]     History of Present Illness:  This is a 60 year old white female with intractable nausea and vomiting for the past 3 days. She was evaluated in the emergency room 2 days ago and was send home after receiving antiemetics and after having a normal CT scan of the abdomen. 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 She has been treated for gastroesophageal reflux and had a Nissen fundoplication in 1995. She had a subsequent hypertensive lower esophageal sphincter necessitating dilatation. Her last endoscopy was in 2003 using a large 18 mm dilator which was very effective in relieving her dysphagia. She has a family history of colon cancer in her sister. Her last colonoscopy in December 2007 showed diverticulosis. She has the questionable diagnosis of Sjgren syndrome. She has lost about 20 pounds in the last several years from 105 pounds to a current 94.8 pounds.   Past Medical History  Diagnosis Date  . Diverticulosis of colon 02/17/06  . GERD (gastroesophageal reflux disease)   . Hyperlipidemia   . Hypertension   . Seizure disorder   . Asthma   . LBP (low back pain)   . OA (osteoarthritis)   . Vitamin B 12 deficiency   . Vitamin D deficiency   . Sjogren's disease 2010    per Dr. Manson Passey, DDS  . Migraine headache   . Gastric ulcer   . AVM (arteriovenous malformation)   . CVA (cerebral infarction)     following brain surgery  . Depression   . Seizure disorder   . Colon polyp 01/04/91    hyperplastic  . Endometriosis    Past Surgical History  Procedure Date  . Mouth surgery   . Nissen fundoplication 1999  . Brain surgery 1991  . Temporomandibular joint surgery 02/2001  . Laparoscopic ovarian cystectomy   . Cholecystectomy     reports that she has never smoked. She does not have any smokeless tobacco history on file. She reports that she does  not drink alcohol or use illicit drugs. family history includes Colon cancer in her sister; Diabetes in her other; Heart disease in her father; Hypertension in her mother and other; and Pancreatic cancer in her father. Allergies  Allergen Reactions  . Advair Hfa   . Amantadine Hcl   . Azithromycin   . Cephalexin   . Ciprofloxacin   . Cyproheptadine Hcl   . Doxycycline Hyclate   . Erythromycin Base   . Flagyl (Metronidazole Hcl)   . Fluconazole   . Guaifenesin   . Imipramine Hcl   . Lansoprazole   . Levothyroxine Sodium   . Menthol   . Metoclopramide Hcl   . Metronidazole   . Montelukast Sodium   . Moxifloxacin   . Omeprazole   . Oxycodone-Acetaminophen   . Penicillins   . Pirbuterol Acetate   . Pravastatin Sodium   . Propulsid (Cisapride)   . Salmeterol Xinafoate   . Sulfadiazine   . Sulfamethoxazole W/Trimethoprim   . Telithromycin   . Tetracycline Hcl   . Topiramate   . Zolpidem Tartrate     REACTION: not effective        Review of Systems: See history of present illness  The remainder of the 10  point ROS is negative except as outlined in H&P   Physical Exam: General appearance thin ill-appearing female, in distress, continuously gagging and retching. Eyes- non icteric. HEENT nontraumatic,  normocephalic. Mouth no lesions, tongue papillated, no cheilosis dry mucous membranes. Neck supple without adenopathy, thyroid not enlarged, no carotid bruits, no JVD. Lungs Clear to auscultation bilaterally. Cor normal S1 normal S2, regular rhythm , no murmur,  quiet precordium. Abdomen soft scaphoid with normal active bowel sounds. Tenderness in epigastrium and left upper quadrant. No bruit, no palpable mass. Rectal: Soft Hemoccult negative stool. Extremities no pedal edema. Skin no lesions. Neurological alert and oriented x 3. Psychological normal mood and affect.  Assessment and Plan:  Problem #1 Intractable nausea and vomiting in a patient with a prior Nissen  fundoplication. We need to rule out gas bloat syndrome, gastric outlet obstruction or small bowel obstruction. A recent CT scan of the abdomen was negative. We need to rule out withdrawal from psychotropic medications. She appears to be dehydrated. She will be admitted for intravenous hydration and diagnostic workup which will include baseline labs. We will place a nasogastric tube to suction and decompress her stomach. She will likely need an upper endoscopy and possible esophageal dilation depending on the findings. The plan of care has been discussed with the extender.   09/02/2010 Veronica [name]

## 2010-09-02 NOTE — Patient Instructions (Signed)
Admit to [facility] at [facility]. Dr Juanda Chance and Willette Cluster, NP have discussed your case and are awaiting your arrival. CC: Dr.A Plotnikov

## 2010-09-02 NOTE — Telephone Encounter (Signed)
Pt informed on 09-01-10

## 2010-09-03 ENCOUNTER — Other Ambulatory Visit: Payer: Self-pay | Admitting: Gastroenterology

## 2010-09-03 DIAGNOSIS — R112 Nausea with vomiting, unspecified: Secondary | ICD-10-CM

## 2010-09-03 DIAGNOSIS — K2960 Other gastritis without bleeding: Secondary | ICD-10-CM

## 2010-09-03 DIAGNOSIS — R1013 Epigastric pain: Secondary | ICD-10-CM

## 2010-09-03 DIAGNOSIS — E860 Dehydration: Secondary | ICD-10-CM

## 2010-09-04 ENCOUNTER — Telehealth: Payer: Self-pay | Admitting: Internal Medicine

## 2010-09-04 LAB — ALBUMIN: Albumin: 3.3 g/dL — ABNORMAL LOW (ref 3.5–5.2)

## 2010-09-05 DIAGNOSIS — K2990 Gastroduodenitis, unspecified, without bleeding: Secondary | ICD-10-CM

## 2010-09-05 DIAGNOSIS — K2970 Gastritis, unspecified, without bleeding: Secondary | ICD-10-CM

## 2010-09-05 NOTE — Telephone Encounter (Signed)
I will call them  When I return on September 14, 2010, , please, remind me , DB

## 2010-09-07 ENCOUNTER — Telehealth: Payer: Self-pay | Admitting: Internal Medicine

## 2010-09-07 NOTE — Telephone Encounter (Signed)
Patient has a follow up appt on 10/12/10 with Dr Juanda Chance, patient is asking if this is ok.  There is no discharge summary available in e-chart or in EPIC.  I have asked her to keep this appt and continue her discharge meds and if she is having continued problems she needs to call back sooner.  She is asking for a rx for Ativan because they gave her some while she was inpatient.  I have advised her that we won't be able to give her a rx and she can contact her primary care.  Her discharge med list does not include ativan, nor was she taking this prior to admission.

## 2010-09-24 NOTE — Telephone Encounter (Signed)
Per Dr Juanda Chance, she called and left a message that she hoped the clinical information had been received by now. She left her pager number if insurance still needed her to speak with them.

## 2010-10-12 ENCOUNTER — Ambulatory Visit (INDEPENDENT_AMBULATORY_CARE_PROVIDER_SITE_OTHER): Payer: BC Managed Care – PPO | Admitting: Internal Medicine

## 2010-10-12 ENCOUNTER — Encounter: Payer: Self-pay | Admitting: Internal Medicine

## 2010-10-12 VITALS — BP 144/76 | HR 68 | Ht 62.0 in | Wt 96.8 lb

## 2010-10-12 DIAGNOSIS — R1013 Epigastric pain: Secondary | ICD-10-CM

## 2010-10-12 DIAGNOSIS — K2960 Other gastritis without bleeding: Secondary | ICD-10-CM

## 2010-10-12 MED ORDER — LORAZEPAM 0.5 MG PO TABS: 0.5000 mg | ORAL_TABLET | Freq: Two times a day (BID) | ORAL | Status: DC | PRN

## 2010-10-12 MED ORDER — SUCRALFATE 1 GM/10ML PO SUSP: ORAL | Status: DC

## 2010-10-12 MED ORDER — OMEPRAZOLE 20 MG PO CPDR: 20.0000 mg | DELAYED_RELEASE_CAPSULE | Freq: Two times a day (BID) | ORAL | Status: DC

## 2010-10-12 NOTE — Patient Instructions (Addendum)
We have sent the following medications to your pharmacy for you to pick up at your convenience: We have changed your Nexium to Prilosec (omeprazole) 20 mg twice daily. Carafate 10 cc by mouth twice daily. Lorazepam (Ativan) 0.5 mg. Take 1 tablet by mouth twice daily.  Dr Posey Rea

## 2010-10-12 NOTE — Progress Notes (Signed)
Veronica [name] Oct 02, 1950 MRN [id number]        History of Present Illness:  This is a 60 year old white female with history of left hemispheric AVM surgically repaired, resulting in left hemiparesis complicated by headaches and seizure disorder. She has a history of severe gastroesophageal reflux. Post Nissen fundoplication in 1995. She has a family history of colon cancer in her sister. Last colonoscopy in 2007. Recall colonoscopy was due  in May 2012. She has had weight loss from an 105s in 94 pounds. CT scan of the abdomen in June 2012 was normal. Upper endoscopy during recent hospitalization for nausea and vomiting showed functioning Nissen fundoplication and gastritis. She has been doing much better since her hospitalization taking Carafate twice a day and Nexium 40 mg twice a day. She cannot afford Nexium and would like to take another PPI. She still has periodic episodes of gas bloat syndrome. She has a chronic diarrhea which was previously evaluated Last  colonoscopy in 2007. Random biopsies showed no evidence of microscopic colitis  Past Medical History  Diagnosis Date  . Diverticulosis of colon 02/17/06  . GERD (gastroesophageal reflux disease)   . Hyperlipidemia   . Hypertension   . Seizure disorder   . Asthma   . LBP (low back pain)   . OA (osteoarthritis)   . Vitamin B 12 deficiency   . Vitamin D deficiency   . Sjogren's disease 2010    per Dr. Manson Passey, DDS  . Migraine headache   . Gastric ulcer   . AVM (arteriovenous malformation)   . CVA (cerebral infarction)     following brain surgery  . Depression   . Seizure disorder   . Colon polyp 01/04/91    hyperplastic  . Endometriosis    Past Surgical History  Procedure Date  . Mouth surgery   . Nissen fundoplication 1999  . Brain surgery 1991  . Temporomandibular joint surgery 02/2001  . Laparoscopic ovarian cystectomy   . Cholecystectomy     reports that she has never smoked. She has never used smokeless tobacco.  She reports that she does not drink alcohol or use illicit drugs. family history includes Colon cancer in her sister; Diabetes in her brother and other; Heart disease in her father; Hypertension in her mother and other; and Pancreatic cancer in her father. Allergies  Allergen Reactions  . Advair Hfa   . Amantadine Hcl   . Avelox (Moxifloxacin Hcl In Nacl)   . Azithromycin   . Cephalexin   . Ciprofloxacin   . Cyproheptadine Hcl   . Doxycycline Hyclate   . Erythromycin Base   . Flagyl (Metronidazole Hcl)   . Fluconazole   . Guaifenesin   . Imipramine Hcl   . Lansoprazole   . Levothyroxine Sodium   . Menthol   . Metoclopramide Hcl   . Metronidazole   . Montelukast Sodium   . Moxifloxacin   . Omeprazole   . Oxycodone-Acetaminophen   . Penicillins   . Pirbuterol Acetate   . Pravastatin Sodium   . Propulsid (Cisapride)   . Salmeterol Xinafoate   . Sulfadiazine   . Sulfamethoxazole W/Trimethoprim   . Telithromycin   . Tetracycline Hcl   . Topiramate   . Zolpidem Tartrate     REACTION: not effective        Review of Systems: Denies chest pain shortness of breath  The remainder of the 10  point ROS is negative except as outlined in H&P   Physical Exam: General  appearance  thin chronically ill-appearing alert and oriented Eyes- non icteric HEENT nontraumatic, normocephalic Mouth no lesions, tongue papillated, no cheilosis Neck supple without adenopathy, thyroid not enlarged, no carotid bruits, no JVD Lungs Clear to auscultation bilaterally Cor normal S1 normal S2, regular rhythm , no murmur,  quiet precordium Abdomen soft spastic abdomen with normoactive bowel sounds and epigastric tenderness Rectal: Not done Extremities no pedal edema Skin no lesions Neurological alert and oriented x3, right facial weakness Psychological normal mood and  affect  Assessment and Plan:  Status post hospitalizations for intractable nausea vomiting due to  gastritis. Much improved  on Carafate and PPI. Will switch to Prilosec 20 mg twice a day. She has gas bloat syndrome,  Chronic diarrhea. Consistent with irritable bowel syndrome. Imodium when necessary  Family history of colon cancer in a sister. Would be due for recall colonoscopy December 2012   10/12/2010 [name]

## 2010-10-13 NOTE — Discharge Summary (Signed)
Veronica [name], [name] NO.:  [id number]  MEDICAL RECORD NO.:  [id number]  LOCATION:  1511                         FACILITY:  [facility]  PHYSICIAN:  Judie Petit T. Russella Dar, MD, FACGDATE OF BIRTH:  [birth year]  DATE OF ADMISSION:  09/02/2010 DATE OF DISCHARGE:  09/05/2010                              DISCHARGE SUMMARY   ADMITTING DIAGNOSES: 1. Three day history of nausea, vomiting: intractable. 2. Gastroesophageal reflux disease, status post Nissen fundoplication     in 1995. 3. Status post esophageal dilatations for hypertensive lower     esophageal sphincter.  Last endoscopy occurred in 2003 at which     time she was dilated.  She had effective relief of dysphagia     following dilatation. 4. Diverticulosis.  Last colonoscopy in 2007 demonstrated     diverticulosis. 5. Questionable diagnosis of Sjogren's syndrome. 6. Weight loss of 20 pounds over the last several years.  Current     weight is 94.8 pounds. 7. Hyperlipidemia. 8. Hypertension. 9. Seizure disorder. 10.Asthma. 11.Chronic low back pain. 12.Osteoarthritis. 13.Vitamin B12 and vitamin D deficiencies. 14.History of migraine headaches. 15.Remote history of gastric ulcers. 16.History of cerebrovascular accident. 17.Depression. 18.History of hyperplastic colon polyps. 19.History of endometriosis. 20.History of undetermined allergies versus adverse side effects from     multiple medications.  There are 32 different medications which     have been added to the allergen list.  However, one of these,     zolpidem, the reaction is "not effective".  PAST SURGICAL HISTORY: 1. Bilateral temporomandibular joint surgery in 2002. 2. Laparoscopic cholecystectomy in July 2003. 3. Excision of right breast mass in 2007, biopsies consistent with     fibrocystic changes and stromal fibrosis. 4. Status post excision of melanocytic nevus from the right hip in     2007. 5. Status post laparoscopic ovarian  cystectomy.  FAMILY HISTORY: 1. Colon cancer in a sister. 2. Pancreatic cancer in her father.  DISCHARGE DIAGNOSES: 1. Nausea and vomiting secondary to gastritis, and possibly a     functional component to her complaint as well. 2. Left upper quadrant pain.  No clear source.  Question whether this     is from the gastritis, whether it is musculoskeletal, or whether     she has fibromyalgia. 3. Seizure disorder.  Her Dilantin levels are within an acceptable     range.  BRIEF HISTORY:  [name] is a 60 year old white female who went to see Dr. [name] at the office with a 3-day history of unrelenting nausea and vomiting.  She had been seen 2 days prior at the emergency room and got temporary relief after some IV antiemetics were given.  A CT scan in the ED was within normal limits.   She was seen in the office by Dr. Juanda Chance and was admitted for refractory nausea and vomiting.  GI meds she had been using but to little benefit for her symptoms included: Carafate, Phenergan p.r.n., Nexium 40 mg once a day.  IMAGING STUDIES / X-RAYS:  September 02, 2010, acute abdominal series showed no active lung disease, no abnormal bowel gas pattern, and no bowel obstruction or free air.  CONSULTATIONS:  None.  PROCEDURE:  Upper endoscopy by Dr. Judie Petit on September 03, 2010. Moderate antral gastritis described as erythematous, granular, and friable. Biopsies obtained.  Otherwise stomach mucosa was normal.  Esophageal and GE junction normal in appearance.  Retroflexed views revealed prior fundoplication.  LABORATORY DATA:  Lipase 18, amylase 81.  Sodium 138, potassium 3.5, chloride 99, CO2 31, glucose 88, BUN 11, creatinine 0.67.  Total bilirubin 0.3, alkaline phosphatase 94, AST 18, ALT 17, albumin 3.9. The erythrocyte sedimentation rate/ESR was 0.  Thyroid stimulating hormone/TSH level was 1.782.  Hemoglobin 13.9, hematocrit 41.7, white blood cell count 7.2, MCV 86.7, platelet count 258.   Urinalysis showed 3 to 6 white blood cells per high-powered field, a few bacteria present, rare squamous epithelial cells, small amount of leukocytes, no nitrites, specific gravity within normal limits.  Phenytoin level 15.7.  HOSPITAL COURSE:  The patient was admitted to a non-telemetry bed.  She was supported with IV fluids and did initially receive a bolus of IV fluids.  She was started on IV Protonix once daily, p.r.n. IV Phenergan, p.r.n. IV Ativan.  Her Carafate slurry was continued.  Diet was that of ice chips and sips of clear liquids.  The patient's symptoms persisted.  On September 03, 2010, the patient underwent upper endoscopy which demonstrated moderate antral gastritis. Postsurgical changes from prior fundoplication were noted.  Biopsies from the stomach showed active gastritis with erosions and reactive epithelial changes.  No H. pylori was present.  Following endoscopy, the patient's IV Protonix was increased to twice daily, diet was advanced to clear liquid, and she seemed to improve. However, she was very focused on getting her Ativan, her Phenergan, and her morphine as soon as it was possible to get these.  She was deemed stable for discharge and went home in stable condition on September 05, 2010. She was advised to follow a low-fiber diet.  She was to call Dr. Regino Schultze office to schedule a return appointment at her convenience within the next 2 to 4 weeks.  She was to call Dr. Regino Schultze office if her GI symptoms worsened or were unmanageable at home.  MEDICATIONS AT DISCHARGE: 1. Phenytoin extended release 100 mg 2 capsules in the morning and 1     capsule in the evening. 2. Pulmicort/budesonide inhaler one nebulizer treatment three times     daily as needed for wheezing. 3. Xopenex nebulizers 1.25 mg t.i.d. p.r.n. wheezing. 4. Combivent inhaler 2 puffs daily. 5. Vitamin B12 injection 1 mL once every 2 weeks. 6. Tylenol with codeine 1 tablet every 6 hours p.r.n. pain. 7.  Phenergan 25 mg 1 tablet every 6 hours p.r.n. nausea. 8. Acetaminophen 500 mg 2 tablets every 6 hours p.r.n. pain. 9. Trazodone 50 mg 4 tablets daily at bedtime. 10.Flexeril/cyclobenzaprine 10 mg p.o. t.i.d. p.r.n. muscle aches and     body pains. 11.Pravastatin 40 mg 2 tablets daily. 12.Nexium 40 mg 1 tablet daily in the morning. 13.Clonazepam 1 mg 2 tablets daily at bedtime.     Jennye Moccasin, PA-C   ______________________________ Venita Lick. Russella Dar, MD, [facility]    SG/MEDQ  D:  09/15/2010  T:  09/15/2010  Job:  [id number]  cc:   Hedwig Morton. Juanda Chance, MD [address] [place] [address]  Electronically Signed by Jennye Moccasin PA-C on 10/02/2010 04:06:13 PM Electronically Signed by Claudette Head MD FACG on 10/13/2010 04:05:27 PM

## 2010-10-23 ENCOUNTER — Other Ambulatory Visit: Payer: Self-pay | Admitting: Internal Medicine

## 2010-11-27 ENCOUNTER — Other Ambulatory Visit: Payer: Self-pay | Admitting: Internal Medicine

## 2010-12-05 ENCOUNTER — Other Ambulatory Visit: Payer: Self-pay | Admitting: Internal Medicine

## 2010-12-30 DIAGNOSIS — Z8719 Personal history of other diseases of the digestive system: Secondary | ICD-10-CM | POA: Insufficient documentation

## 2010-12-30 DIAGNOSIS — I69351 Hemiplegia and hemiparesis following cerebral infarction affecting right dominant side: Secondary | ICD-10-CM | POA: Insufficient documentation

## 2010-12-30 DIAGNOSIS — H409 Unspecified glaucoma: Secondary | ICD-10-CM | POA: Insufficient documentation

## 2010-12-30 DIAGNOSIS — E785 Hyperlipidemia, unspecified: Secondary | ICD-10-CM | POA: Insufficient documentation

## 2010-12-30 DIAGNOSIS — N319 Neuromuscular dysfunction of bladder, unspecified: Secondary | ICD-10-CM | POA: Insufficient documentation

## 2010-12-30 DIAGNOSIS — Q282 Arteriovenous malformation of cerebral vessels: Secondary | ICD-10-CM | POA: Insufficient documentation

## 2010-12-30 DIAGNOSIS — G2581 Restless legs syndrome: Secondary | ICD-10-CM | POA: Insufficient documentation

## 2010-12-30 DIAGNOSIS — K5790 Diverticulosis of intestine, part unspecified, without perforation or abscess without bleeding: Secondary | ICD-10-CM | POA: Insufficient documentation

## 2010-12-30 DIAGNOSIS — G43109 Migraine with aura, not intractable, without status migrainosus: Secondary | ICD-10-CM | POA: Insufficient documentation

## 2010-12-30 DIAGNOSIS — M4800 Spinal stenosis, site unspecified: Secondary | ICD-10-CM | POA: Insufficient documentation

## 2010-12-31 DIAGNOSIS — R002 Palpitations: Secondary | ICD-10-CM | POA: Insufficient documentation

## 2011-01-04 ENCOUNTER — Other Ambulatory Visit: Payer: Self-pay | Admitting: *Deleted

## 2011-01-04 MED ORDER — BUDESONIDE 0.25 MG/2ML IN SUSP: 0.2500 mg | Freq: Three times a day (TID) | RESPIRATORY_TRACT | Status: DC

## 2011-01-08 ENCOUNTER — Encounter: Payer: Self-pay | Admitting: Internal Medicine

## 2011-01-08 ENCOUNTER — Ambulatory Visit (INDEPENDENT_AMBULATORY_CARE_PROVIDER_SITE_OTHER): Payer: BC Managed Care – PPO | Admitting: Internal Medicine

## 2011-01-08 VITALS — BP 120/60 | HR 68 | Temp 98.7°F | Resp 16 | Wt 98.0 lb

## 2011-01-08 DIAGNOSIS — E538 Deficiency of other specified B group vitamins: Secondary | ICD-10-CM

## 2011-01-08 DIAGNOSIS — R569 Unspecified convulsions: Secondary | ICD-10-CM

## 2011-01-08 DIAGNOSIS — K219 Gastro-esophageal reflux disease without esophagitis: Secondary | ICD-10-CM

## 2011-01-08 DIAGNOSIS — J449 Chronic obstructive pulmonary disease, unspecified: Secondary | ICD-10-CM

## 2011-01-08 MED ORDER — MAGIC MOUTHWASH: 5.0000 mL | Freq: Four times a day (QID) | ORAL | Status: DC

## 2011-01-08 NOTE — Assessment & Plan Note (Signed)
Continue with current prescription therapy as reflected on the Med list. Levels to check in 3 mo

## 2011-01-08 NOTE — Progress Notes (Signed)
  Subjective:    Patient ID: Veronica [name], female    DOB: December 23, 1950, 60 y.o.   MRN: [id number]  HPI  The patient presents for a follow-up of  chronic hypertension, chronic dyslipidemia, COPD, depression controlled with medicines Using a cardiac monitor - rash from patches     Review of Systems  Constitutional: Positive for fatigue. Negative for fever, chills, diaphoresis, activity change, appetite change and unexpected weight change.  HENT: Negative for hearing loss, ear pain, nosebleeds, congestion, sore throat, facial swelling, rhinorrhea, sneezing, mouth sores, trouble swallowing, neck pain, neck stiffness, postnasal drip, sinus pressure and tinnitus.   Eyes: Negative for pain, discharge, redness, itching and visual disturbance.  Respiratory: Positive for cough. Negative for chest tightness, shortness of breath, wheezing and stridor.   Cardiovascular: Negative for chest pain, palpitations and leg swelling.  Gastrointestinal: Negative for nausea, diarrhea, constipation, blood in stool, abdominal distention, anal bleeding and rectal pain.  Genitourinary: Negative for dysuria, urgency, frequency, hematuria, flank pain, vaginal bleeding, vaginal discharge, difficulty urinating, genital sores and pelvic pain.  Musculoskeletal: Positive for back pain and gait problem. Negative for joint swelling and arthralgias.  Skin: Negative.  Negative for rash.  Neurological: Positive for dizziness, weakness and numbness. Negative for tremors, seizures, syncope, speech difficulty and headaches.  Hematological: Negative for adenopathy. Does not bruise/bleed easily.  Psychiatric/Behavioral: Negative for suicidal ideas, behavioral problems, sleep disturbance, dysphoric mood and decreased concentration. The patient is not nervous/anxious.        Objective:   Physical Exam  Constitutional: She appears well-developed and well-nourished. No distress.  HENT:  Head: Normocephalic.  Right Ear: External ear  normal.  Left Ear: External ear normal.  Nose: Nose normal.  Mouth/Throat: Oropharynx is clear and moist.  Eyes: Conjunctivae are normal. Pupils are equal, round, and reactive to light. Right eye exhibits no discharge. Left eye exhibits no discharge.  Neck: Normal range of motion. Neck supple. No JVD present. No tracheal deviation present. No thyromegaly present.  Cardiovascular: Normal rate, regular rhythm and normal heart sounds.   Pulmonary/Chest: No stridor. No respiratory distress. She has no wheezes.  Abdominal: Soft. Bowel sounds are normal. She exhibits no distension and no mass. There is no tenderness. There is no rebound and no guarding.  Musculoskeletal: She exhibits tenderness. She exhibits no edema.  Lymphadenopathy:    She has no cervical adenopathy.  Neurological: She displays normal reflexes. No cranial nerve deficit. She exhibits normal muscle tone. Coordination abnormal.  Skin: No rash noted. No erythema.  Psychiatric: Her behavior is normal. Judgment and thought content normal.          Assessment & Plan:

## 2011-01-08 NOTE — Assessment & Plan Note (Signed)
Continue with current prescription therapy as reflected on the Med list.  

## 2011-01-11 ENCOUNTER — Encounter: Payer: Self-pay | Admitting: Internal Medicine

## 2011-01-11 NOTE — Assessment & Plan Note (Signed)
Continue with current prescription therapy as reflected on the Med list.  

## 2011-01-29 ENCOUNTER — Encounter: Payer: Self-pay | Admitting: Internal Medicine

## 2011-01-29 ENCOUNTER — Ambulatory Visit (INDEPENDENT_AMBULATORY_CARE_PROVIDER_SITE_OTHER): Payer: BC Managed Care – PPO | Admitting: Internal Medicine

## 2011-01-29 VITALS — BP 128/64 | HR 80 | Ht 62.0 in | Wt 100.4 lb

## 2011-01-29 DIAGNOSIS — K219 Gastro-esophageal reflux disease without esophagitis: Secondary | ICD-10-CM

## 2011-01-29 DIAGNOSIS — R112 Nausea with vomiting, unspecified: Secondary | ICD-10-CM

## 2011-01-29 MED ORDER — OMEPRAZOLE 20 MG PO CPDR: DELAYED_RELEASE_CAPSULE | ORAL | Status: DC

## 2011-01-29 NOTE — Patient Instructions (Signed)
Use gaviscon as directed. This may be purchased over the counter.

## 2011-01-29 NOTE — Progress Notes (Signed)
Veronica [name] 25-Sep-1950 MRN [id number]        History of Present Illness:  This is a 60 year old white female with history of gastroesophageal reflux status post remote Nissen fundoplication in 1995, hypertensive LES necessitating dilation in the past. 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 she was diagnosed with a gas bloat syndrome and responded to nasogastric suction and decompression. She is now doing well   Past Medical History  Diagnosis Date  . Diverticulosis of colon 02/17/06  . GERD (gastroesophageal reflux disease)   . Hyperlipidemia   . Hypertension   . Seizure disorder   . Asthma   . LBP (low back pain)   . OA (osteoarthritis)   . Vitamin B 12 deficiency   . Vitamin D deficiency   . Sjogren's disease 2010    per Dr. Manson Passey, DDS  . Migraine headache   . Gastric ulcer   . AVM (arteriovenous malformation)   . CVA (cerebral infarction)     following brain surgery  . Depression   . Seizure disorder   . Colon polyp 01/04/91    hyperplastic  . Endometriosis    Past Surgical History  Procedure Date  . Mouth surgery   . Nissen fundoplication 1999  . Brain surgery 1991  . Temporomandibular joint surgery 02/2001  . Laparoscopic ovarian cystectomy   . Cholecystectomy     reports that she has never smoked. She has never used smokeless tobacco. She reports that she does not drink alcohol or use illicit drugs. family history includes Colon cancer in her sister; Diabetes in her brother and other; Heart disease in her father; Hypertension in her mother and other; and Pancreatic cancer in her father. Allergies  Allergen Reactions  . Advair Hfa   . Amantadine Hcl   . Avelox (Moxifloxacin Hcl In Nacl)   . Azithromycin   . Cephalexin   . Ciprofloxacin   . Cyproheptadine Hcl   . Doxycycline Hyclate   . Erythromycin Base   . Flagyl (Metronidazole Hcl)   . Fluconazole   .  Guaifenesin   . Imipramine Hcl   . Lansoprazole   . Latex   . Levothyroxine Sodium   . Menthol   . Metoclopramide Hcl   . Metronidazole   . Montelukast Sodium   . Moxifloxacin   . Oxycodone-Acetaminophen   . Penicillins   . Pirbuterol Acetate   . Pravastatin Sodium   . Propulsid (Cisapride)   . Reglan   . Salmeterol Xinafoate   . Sulfadiazine   . Sulfamethoxazole W/Trimethoprim   . Telithromycin   . Tetracycline Hcl   . Topiramate   . Zolpidem Tartrate     REACTION: not effective        Review of Systems: Occasional dysphagia, no odynophagia. Denies chest pain vomiting or abdominal pain, positive for occasional diarrhea  The remainder of the 10 point ROS is negative except as outlined in H&P   Physical Exam: General appearance  Well developed, in no distress. Eyes- non icteric. HEENT nontraumatic, normocephalic. Mouth no lesions, tongue papillated, no cheilosis. Neck supple without adenopathy, thyroid not enlarged, no carotid bruits, no JVD. Lungs Clear to auscultation bilaterally. Cor normal S1, normal S2, regular rhythm, no murmur,  quiet precordium. Abdomen: Soft nontender abdomen with normal active bowel sounds. No distention. No tenderness, liver edge at costal margin Rectal: Not done Extremities no pedal edema. Skin no lesions. Neurological alert and oriented  x 3. Psychological normal mood and affect.  Assessment and Plan:  Status post recent hospitalization for nausea or vomiting. Relieved via nasogastric suction. Patient has a gas bloat syndrome she responds to NG decompression. She will continue on her proton pump inhibitor and antireflux measures  Family history of colon cancer. Last colon 02/2006, due for a repeat colon. Will contact pt with recall letter   01/29/2011 [name]

## 2011-02-12 ENCOUNTER — Telehealth: Payer: Self-pay | Admitting: *Deleted

## 2011-02-12 NOTE — Telephone Encounter (Signed)
Dr Juanda Chance- Lorain Childes, I have contacted patient on 2 occasions to schedule colonoscopy and she has stated both times that she will call back. So, we will just wait on that call.

## 2011-02-12 NOTE — Telephone Encounter (Signed)
Message copied by Richardson Chiquito on Fri Feb 12, 2011  8:21 AM ------      Message from: [place], [place]      Created: Wed Feb 03, 2011 11:59 AM      Regarding: FW: recall colon       Patient states that she still is not sure when her husband can bring her for her test. States she will call back soon.            ----- Message -----         From: Vernia Buff, CMA         Sent: 02/03/2011           To: Vernia Buff, CMA      Subject: FW: recall colon                                         Patient states that she will call back to schedule      ----- Message -----         From: Hart Carwin, MD         Sent: 01/29/2011   9:52 PM           To: Vernia Buff, CMA      Subject: recall colon                                             Dottie, I found out after pt left the office that she is due for recall colon, last exam 2006-03-13, sister died of colon cancer

## 2011-02-13 NOTE — Telephone Encounter (Signed)
OK , we will wait, please send a recall letter in April 2013.

## 2011-02-15 NOTE — Telephone Encounter (Signed)
Reminder entered into the system for 06/2011.

## 2011-03-24 ENCOUNTER — Encounter: Payer: Self-pay | Admitting: Internal Medicine

## 2011-03-24 ENCOUNTER — Ambulatory Visit (INDEPENDENT_AMBULATORY_CARE_PROVIDER_SITE_OTHER): Payer: BC Managed Care – PPO | Admitting: Internal Medicine

## 2011-03-24 ENCOUNTER — Other Ambulatory Visit (INDEPENDENT_AMBULATORY_CARE_PROVIDER_SITE_OTHER): Payer: BC Managed Care – PPO

## 2011-03-24 VITALS — BP 150/82 | HR 80 | Temp 97.5°F | Resp 16 | Wt 104.0 lb

## 2011-03-24 DIAGNOSIS — L509 Urticaria, unspecified: Secondary | ICD-10-CM

## 2011-03-24 DIAGNOSIS — F329 Major depressive disorder, single episode, unspecified: Secondary | ICD-10-CM

## 2011-03-24 DIAGNOSIS — R569 Unspecified convulsions: Secondary | ICD-10-CM

## 2011-03-24 DIAGNOSIS — E538 Deficiency of other specified B group vitamins: Secondary | ICD-10-CM

## 2011-03-24 LAB — CBC WITH DIFFERENTIAL/PLATELET
Eosinophils Relative: 0.5 % (ref 0.0–5.0)
HCT: 42.8 % (ref 36.0–46.0)
Hemoglobin: 14.4 g/dL (ref 12.0–15.0)
Lymphs Abs: 2.8 10*3/uL (ref 0.7–4.0)
MCV: 89.3 fl (ref 78.0–100.0)
Monocytes Absolute: 0.9 10*3/uL (ref 0.1–1.0)
Monocytes Relative: 9.6 % (ref 3.0–12.0)
Neutro Abs: 5.1 10*3/uL (ref 1.4–7.7)
RDW: 14.2 % (ref 11.5–14.6)
WBC: 8.9 10*3/uL (ref 4.5–10.5)

## 2011-03-24 LAB — BASIC METABOLIC PANEL
CO2: 31 mEq/L (ref 19–32)
Calcium: 9.9 mg/dL (ref 8.4–10.5)
Creatinine, Ser: 0.8 mg/dL (ref 0.4–1.2)
Glucose, Bld: 94 mg/dL (ref 70–99)

## 2011-03-24 LAB — HEPATIC FUNCTION PANEL
Albumin: 4.7 g/dL (ref 3.5–5.2)
Total Protein: 7.7 g/dL (ref 6.0–8.3)

## 2011-03-24 LAB — VITAMIN B12: Vitamin B-12: >1500 pg/mL — ABNORMAL HIGH (ref 211–911)

## 2011-03-24 MED ORDER — VILAZODONE HCL 40 MG PO TABS: 40.0000 mg | ORAL_TABLET | ORAL | Status: DC

## 2011-03-24 MED ORDER — CYANOCOBALAMIN 1000 MCG/ML IJ SOLN
1000.0000 ug | Freq: Once | INTRAMUSCULAR | Status: AC
  Administered 2011-03-24: 1000 ug via INTRAMUSCULAR

## 2011-03-24 NOTE — Assessment & Plan Note (Signed)
Continue with current prescription therapy as reflected on the Med list.  

## 2011-03-24 NOTE — Progress Notes (Signed)
  Subjective:    Patient ID: Veronica [name], female    DOB: [birth year], 61 y.o.   MRN: [id number]  HPI  The patient is here to follow up on chronic depression, anxiety,seizures, CVA and chronic moderate fibromyalgia symptoms controlled with medicines, diet and exercise. C/o stress. Dr Conrad Woodlands her Neurologist has ref her to Psychiatry in [place]. OK w/him to use antidepressants. C/o rash on chest and arms after she started to use fluoxetine - stopped. Remeron gave nightmares.    Review of Systems  Constitutional: Positive for fatigue. Negative for chills, activity change, appetite change and unexpected weight change.  HENT: Negative for congestion, mouth sores and sinus pressure.   Eyes: Negative for visual disturbance.  Respiratory: Negative for cough and chest tightness.   Gastrointestinal: Negative for nausea and abdominal pain.  Genitourinary: Negative for frequency, difficulty urinating and vaginal pain.  Musculoskeletal: Positive for arthralgias and gait problem. Negative for back pain.  Skin: Positive for rash. Negative for pallor.  Neurological: Positive for weakness. Negative for dizziness, tremors, numbness and headaches.  Psychiatric/Behavioral: Negative for suicidal ideas, confusion, sleep disturbance and dysphoric mood. The patient is nervous/anxious.        Objective:   Physical Exam  Constitutional: She appears well-developed. No distress.  HENT:  Head: Normocephalic.  Right Ear: External ear normal.  Left Ear: External ear normal.  Nose: Nose normal.  Mouth/Throat: Oropharynx is clear and moist.  Eyes: Conjunctivae are normal. Pupils are equal, round, and reactive to light. Right eye exhibits no discharge. Left eye exhibits no discharge.  Neck: Normal range of motion. Neck supple. No JVD present. No tracheal deviation present. No thyromegaly present.  Cardiovascular: Normal rate, regular rhythm and normal heart sounds.   Pulmonary/Chest: No stridor. No respiratory  distress. She has no wheezes.  Abdominal: Soft. Bowel sounds are normal. She exhibits no distension and no mass. There is no tenderness. There is no rebound and no guarding.  Musculoskeletal: She exhibits no edema and no tenderness.  Lymphadenopathy:    She has no cervical adenopathy.  Neurological: She displays normal reflexes. No cranial nerve deficit. She exhibits normal muscle tone. Coordination (cane, braces) abnormal.  Skin: Rash (papular rash) noted. No erythema.  Psychiatric: She has a normal mood and affect. Her behavior is normal. Judgment and thought content normal.          Assessment & Plan:

## 2011-03-24 NOTE — Assessment & Plan Note (Signed)
D/c fluoxetine

## 2011-03-25 ENCOUNTER — Telehealth: Payer: Self-pay | Admitting: *Deleted

## 2011-03-25 LAB — PHENYTOIN LEVEL, TOTAL: Phenytoin Lvl: 22.4 ug/mL — ABNORMAL HIGH (ref 10.0–20.0)

## 2011-03-25 LAB — TSH: TSH: 2.09 u[IU]/mL (ref 0.35–5.50)

## 2011-03-25 NOTE — Telephone Encounter (Signed)
Pt advised, copy mailed per pt request 

## 2011-03-25 NOTE — Telephone Encounter (Signed)
Called pt. No answer °

## 2011-03-25 NOTE — Telephone Encounter (Signed)
Message copied by Merrilyn Puma on Thu Mar 25, 2011 10:13 AM ------      Message from: Janeal Holmes      Created: Wed Mar 24, 2011 11:24 PM       Misty Stanley, please, inform patient that all labs are OK      Thank you!

## 2011-03-26 ENCOUNTER — Telehealth: Payer: Self-pay | Admitting: Internal Medicine

## 2011-03-26 DIAGNOSIS — R569 Unspecified convulsions: Secondary | ICD-10-CM

## 2011-03-26 NOTE — Telephone Encounter (Signed)
Veronica [name], please, inform patient that all labs are normal except for elev Dilantin level - take Dilantin 1 a day at hs. Recheck dilantin level in 2 wks Thx

## 2011-03-26 NOTE — Telephone Encounter (Signed)
Pt advised in detail 

## 2011-03-26 NOTE — Telephone Encounter (Signed)
Pt advised and is requesting MD advisement on the best time of day to take Viibryd.

## 2011-03-26 NOTE — Telephone Encounter (Signed)
Try in am. If it makes you drowsy - take in pm Thx

## 2011-04-13 ENCOUNTER — Other Ambulatory Visit: Payer: BC Managed Care – PPO

## 2011-04-13 ENCOUNTER — Ambulatory Visit (INDEPENDENT_AMBULATORY_CARE_PROVIDER_SITE_OTHER): Payer: BC Managed Care – PPO | Admitting: Internal Medicine

## 2011-04-13 ENCOUNTER — Encounter: Payer: Self-pay | Admitting: Internal Medicine

## 2011-04-13 DIAGNOSIS — E538 Deficiency of other specified B group vitamins: Secondary | ICD-10-CM

## 2011-04-13 DIAGNOSIS — I10 Essential (primary) hypertension: Secondary | ICD-10-CM

## 2011-04-13 DIAGNOSIS — R569 Unspecified convulsions: Secondary | ICD-10-CM

## 2011-04-13 DIAGNOSIS — E559 Vitamin D deficiency, unspecified: Secondary | ICD-10-CM

## 2011-04-13 DIAGNOSIS — M545 Low back pain: Secondary | ICD-10-CM

## 2011-04-13 DIAGNOSIS — R634 Abnormal weight loss: Secondary | ICD-10-CM

## 2011-04-13 MED ORDER — OXYBUTYNIN 3 (28) % (MG/ACT) TD GEL: 1.0000 | TRANSDERMAL | Status: DC

## 2011-04-13 MED ORDER — PROMETHAZINE-CODEINE 6.25-10 MG/5ML PO SYRP: 5.0000 mL | ORAL_SOLUTION | ORAL | Status: DC | PRN

## 2011-04-13 MED ORDER — VILAZODONE HCL 20 MG PO TABS: 20.0000 mg | ORAL_TABLET | Freq: Every day | ORAL | Status: DC

## 2011-04-13 NOTE — Assessment & Plan Note (Signed)
Continue with current prescription therapy as reflected on the Med list.  

## 2011-04-13 NOTE — Assessment & Plan Note (Signed)
Wt Readings from Last 3 Encounters:  04/13/11 106 lb (48.081 kg)  03/24/11 104 lb (47.174 kg)  01/29/11 100 lb 6.4 oz (45.541 kg)

## 2011-04-13 NOTE — Progress Notes (Signed)
  Subjective:    Patient ID: Veronica [name], female    DOB: 1950-09-12, 61 y.o.   MRN: [id number]  HPI   The patient is here to follow up on chronic marital stress, depression, anxiety, headaches and chronic moderate fibromyalgia symptoms controlled better with medicines now (Viibrid)  Review of Systems  Constitutional: Positive for fatigue. Negative for chills, activity change, appetite change and unexpected weight change.  HENT: Negative for congestion, mouth sores and sinus pressure.   Eyes: Negative for visual disturbance.  Respiratory: Negative for cough, chest tightness and shortness of breath.   Gastrointestinal: Negative for nausea and abdominal pain.  Genitourinary: Negative for frequency, decreased urine volume, difficulty urinating and vaginal pain.  Musculoskeletal: Negative for back pain and gait problem.  Skin: Negative for pallor and rash.  Neurological: Negative for dizziness, tremors, weakness, numbness and headaches.  Psychiatric/Behavioral: Positive for dysphoric mood. Negative for suicidal ideas, confusion and sleep disturbance. The patient is nervous/anxious.        Objective:   Physical Exam  Constitutional: She appears well-developed. No distress.  HENT:  Head: Normocephalic.  Right Ear: External ear normal.  Left Ear: External ear normal.  Nose: Nose normal.  Mouth/Throat: Oropharynx is clear and moist.  Eyes: Conjunctivae are normal. Pupils are equal, round, and reactive to light. Right eye exhibits no discharge. Left eye exhibits no discharge.  Neck: Normal range of motion. Neck supple. No JVD present. No tracheal deviation present. No thyromegaly present.  Cardiovascular: Normal rate, regular rhythm and normal heart sounds.   Pulmonary/Chest: No stridor. No respiratory distress. She has no wheezes.  Abdominal: Soft. Bowel sounds are normal. She exhibits no distension and no mass. There is no tenderness. There is no rebound and no guarding.    Musculoskeletal: She exhibits no edema and no tenderness.  Lymphadenopathy:    She has no cervical adenopathy.  Neurological: She displays normal reflexes. No cranial nerve deficit. She exhibits normal muscle tone. Coordination normal.  Skin: No rash noted. No erythema.  Psychiatric: Her behavior is normal. Judgment and thought content normal.       sad          Assessment & Plan:

## 2011-04-14 ENCOUNTER — Telehealth: Payer: Self-pay | Admitting: Internal Medicine

## 2011-04-14 LAB — PHENYTOIN LEVEL, TOTAL: Phenytoin Lvl: 3 ug/mL — ABNORMAL LOW (ref 10.0–20.0)

## 2011-04-14 NOTE — Telephone Encounter (Signed)
Veronica [name], please, inform patient that her Dilantin level is low now. Is she taking it 1 bid?

## 2011-04-14 NOTE — Telephone Encounter (Signed)
Take 1 po bid Thx

## 2011-04-14 NOTE — Telephone Encounter (Signed)
She has been taking 1 po qhs since it was elevated last at 22.4. Please advise.

## 2011-04-15 NOTE — Telephone Encounter (Signed)
Pt informed

## 2011-04-29 ENCOUNTER — Telehealth: Payer: Self-pay | Admitting: *Deleted

## 2011-04-29 NOTE — Telephone Encounter (Signed)
Pt calling c/o severe nausea, vomiting, diarrhea and numbness in fingers as well as insomnia. Per AVP- I advised pt to take 1/4-1/2 tab and push fluids. She will call us back if not better. She wants to know if she is still like this over the weekend, should she stop Viibryd?

## 2011-04-30 ENCOUNTER — Telehealth: Payer: Self-pay | Admitting: Internal Medicine

## 2011-04-30 MED ORDER — PROMETHAZINE HCL 25 MG PO TABS: 12.5000 mg | ORAL_TABLET | Freq: Four times a day (QID) | ORAL | Status: DC | PRN

## 2011-04-30 MED ORDER — DULOXETINE HCL 30 MG PO CPEP: 30.0000 mg | ORAL_CAPSULE | Freq: Every day | ORAL | Status: DC

## 2011-04-30 NOTE — Telephone Encounter (Signed)
See other phone note dated 04-30-11. Closing this phone note.

## 2011-04-30 NOTE — Telephone Encounter (Signed)
Pt states she has already been taking Viibryd 1/4 tab with no relieve in symptoms. Please advise- does she need to change?

## 2011-04-30 NOTE — Telephone Encounter (Signed)
Dr. Posey Rea advised of below. He wants her to D/C Viibryd and try Cymbalta 30 mg 1 qd. Samples upfront for pt to pick up. Ok to Rf promethazine per AVP.

## 2011-04-30 NOTE — Telephone Encounter (Signed)
Yes - and call me on Mon Thx

## 2011-04-30 NOTE — Telephone Encounter (Signed)
Veronica [name] CALLED TO SAY SHE IS OUT OF PROMETHAZINE AND NEEDS SOME CALLED IN THIS AM TO [facility] IN [place] ([phone]).  PLEASE CALL HER TO LET HER KNOW. SHE WANTS A CALL.  SHE HAS OTHER QUESTIONS ABOUT HER MEDS.

## 2011-05-01 NOTE — Telephone Encounter (Signed)
thx

## 2011-05-03 ENCOUNTER — Ambulatory Visit: Payer: BC Managed Care – PPO | Admitting: Internal Medicine

## 2011-05-03 NOTE — Telephone Encounter (Signed)
Pt left vm today stating she is still nauseated and has diarrhea. What should she do? OV?

## 2011-05-03 NOTE — Telephone Encounter (Signed)
OK OV Did she start Cymbalta? Thx

## 2011-05-04 ENCOUNTER — Ambulatory Visit (INDEPENDENT_AMBULATORY_CARE_PROVIDER_SITE_OTHER): Payer: BC Managed Care – PPO | Admitting: Internal Medicine

## 2011-05-04 ENCOUNTER — Other Ambulatory Visit: Payer: Self-pay | Admitting: *Deleted

## 2011-05-04 ENCOUNTER — Encounter: Payer: Self-pay | Admitting: Internal Medicine

## 2011-05-04 VITALS — BP 168/90 | HR 80 | Temp 98.2°F | Resp 16 | Wt 101.0 lb

## 2011-05-04 DIAGNOSIS — E538 Deficiency of other specified B group vitamins: Secondary | ICD-10-CM

## 2011-05-04 DIAGNOSIS — F329 Major depressive disorder, single episode, unspecified: Secondary | ICD-10-CM

## 2011-05-04 DIAGNOSIS — F411 Generalized anxiety disorder: Secondary | ICD-10-CM

## 2011-05-04 DIAGNOSIS — F32A Depression, unspecified: Secondary | ICD-10-CM

## 2011-05-04 DIAGNOSIS — R197 Diarrhea, unspecified: Secondary | ICD-10-CM

## 2011-05-04 DIAGNOSIS — F419 Anxiety disorder, unspecified: Secondary | ICD-10-CM

## 2011-05-04 MED ORDER — DULOXETINE HCL 30 MG PO CPEP: 30.0000 mg | ORAL_CAPSULE | Freq: Every day | ORAL | Status: DC

## 2011-05-04 MED ORDER — CLONAZEPAM 1 MG PO TABS: 1.0000 mg | ORAL_TABLET | Freq: Three times a day (TID) | ORAL | Status: DC | PRN

## 2011-05-04 NOTE — Assessment & Plan Note (Signed)
We will try to use more clonazepam

## 2011-05-04 NOTE — Telephone Encounter (Signed)
No, she hasn't started Cymbalta. OV scheduled for 4 pm.

## 2011-05-04 NOTE — Assessment & Plan Note (Signed)
2/13 triggered by Viibrid - resolved

## 2011-05-04 NOTE — Assessment & Plan Note (Signed)
1/13 situational, refractory Discussed Will try Cymbalta in 1 wk

## 2011-05-04 NOTE — Assessment & Plan Note (Signed)
Continue with current prescription therapy as reflected on the Med list.  

## 2011-05-05 ENCOUNTER — Other Ambulatory Visit: Payer: Self-pay

## 2011-05-05 MED ORDER — PRAVASTATIN SODIUM 80 MG PO TABS: 80.0000 mg | ORAL_TABLET | Freq: Every day | ORAL | Status: DC

## 2011-05-09 ENCOUNTER — Encounter: Payer: Self-pay | Admitting: Internal Medicine

## 2011-05-09 NOTE — Progress Notes (Signed)
Patient ID: NIAOMI CARTAYA, female   DOB: 21-May-1950, 61 y.o.   MRN: [id number]  Subjective:    Patient ID: Deforest Hoyles, female    DOB: May 04, 1950, 61 y.o.   MRN: [id number]  Diarrhea  Associated symptoms include vomiting. Pertinent negatives include no abdominal pain, chills, coughing or headaches.     The patient is here to follow up on chronic marital stress, depression, anxiety, headaches and chronic moderate fibromyalgia symptoms, not controlled now. C/o nausea and vomiting w/her latest med (Viibrid).  Review of Systems  Constitutional: Positive for fatigue. Negative for chills, activity change, appetite change and unexpected weight change.  HENT: Negative for congestion, mouth sores and sinus pressure.   Eyes: Negative for visual disturbance.  Respiratory: Negative for cough, chest tightness and shortness of breath.   Gastrointestinal: Positive for nausea, vomiting and diarrhea. Negative for abdominal pain.  Genitourinary: Negative for frequency, decreased urine volume, difficulty urinating and vaginal pain.  Musculoskeletal: Negative for back pain and gait problem.  Skin: Negative for pallor and rash.  Neurological: Negative for dizziness, tremors, weakness, numbness and headaches.  Psychiatric/Behavioral: Positive for dysphoric mood. Negative for suicidal ideas, confusion and sleep disturbance. The patient is nervous/anxious.        Objective:   Physical Exam  Constitutional: She appears well-developed. No distress.  HENT:  Head: Normocephalic.  Right Ear: External ear normal.  Left Ear: External ear normal.  Nose: Nose normal.  Mouth/Throat: Oropharynx is clear and moist.  Eyes: Conjunctivae are normal. Pupils are equal, round, and reactive to light. Right eye exhibits no discharge. Left eye exhibits no discharge.  Neck: Normal range of motion. Neck supple. No JVD present. No tracheal deviation present. No thyromegaly present.  Cardiovascular: Normal rate, regular rhythm  and normal heart sounds.   Pulmonary/Chest: No stridor. No respiratory distress. She has no wheezes.  Abdominal: Soft. Bowel sounds are normal. She exhibits no distension and no mass. There is no tenderness. There is no rebound and no guarding.  Musculoskeletal: She exhibits no edema and no tenderness.  Lymphadenopathy:    She has no cervical adenopathy.  Neurological: She displays normal reflexes. No cranial nerve deficit. She exhibits normal muscle tone. Coordination normal.  Skin: No rash noted. No erythema.  Psychiatric: Her behavior is normal. Judgment and thought content normal.       sad          Assessment & Plan:

## 2011-05-28 ENCOUNTER — Other Ambulatory Visit: Payer: Self-pay | Admitting: Internal Medicine

## 2011-05-31 ENCOUNTER — Emergency Department (HOSPITAL_COMMUNITY)
Admission: EM | Admit: 2011-05-31 | Discharge: 2011-06-01 | Disposition: A | Discharge status: Home / Self Care | Payer: BC Managed Care – PPO | Attending: Emergency Medicine | Admitting: Emergency Medicine

## 2011-05-31 ENCOUNTER — Encounter (HOSPITAL_COMMUNITY): Payer: Self-pay | Admitting: Emergency Medicine

## 2011-05-31 DIAGNOSIS — I10 Essential (primary) hypertension: Secondary | ICD-10-CM

## 2011-05-31 DIAGNOSIS — Z8679 Personal history of other diseases of the circulatory system: Secondary | ICD-10-CM | POA: Insufficient documentation

## 2011-05-31 DIAGNOSIS — J4489 Other specified chronic obstructive pulmonary disease: Secondary | ICD-10-CM | POA: Insufficient documentation

## 2011-05-31 DIAGNOSIS — H538 Other visual disturbances: Secondary | ICD-10-CM | POA: Insufficient documentation

## 2011-05-31 DIAGNOSIS — K219 Gastro-esophageal reflux disease without esophagitis: Secondary | ICD-10-CM | POA: Insufficient documentation

## 2011-05-31 DIAGNOSIS — J449 Chronic obstructive pulmonary disease, unspecified: Secondary | ICD-10-CM | POA: Insufficient documentation

## 2011-05-31 DIAGNOSIS — E785 Hyperlipidemia, unspecified: Secondary | ICD-10-CM | POA: Insufficient documentation

## 2011-05-31 DIAGNOSIS — Z79899 Other long term (current) drug therapy: Secondary | ICD-10-CM | POA: Insufficient documentation

## 2011-05-31 DIAGNOSIS — T50905A Adverse effect of unspecified drugs, medicaments and biological substances, initial encounter: Secondary | ICD-10-CM

## 2011-05-31 DIAGNOSIS — R21 Rash and other nonspecific skin eruption: Secondary | ICD-10-CM | POA: Insufficient documentation

## 2011-05-31 HISTORY — DX: Chronic obstructive pulmonary disease, unspecified: J44.9

## 2011-05-31 NOTE — ED Notes (Signed)
Pt states her blood pressure has been running high  Has hx of same  Pt states is on blood pressure medication  Pt states she does not feel well  Pt states she feels foggy headed and her bowel are not moving well  Feels jittery and nauseated   Pt states she has not felt well for a few days

## 2011-06-01 ENCOUNTER — Ambulatory Visit (INDEPENDENT_AMBULATORY_CARE_PROVIDER_SITE_OTHER): Payer: BC Managed Care – PPO | Admitting: Internal Medicine

## 2011-06-01 ENCOUNTER — Emergency Department (HOSPITAL_COMMUNITY): Payer: BC Managed Care – PPO

## 2011-06-01 ENCOUNTER — Other Ambulatory Visit: Payer: Self-pay

## 2011-06-01 ENCOUNTER — Encounter: Payer: Self-pay | Admitting: Internal Medicine

## 2011-06-01 VITALS — BP 148/60 | HR 80 | Temp 97.7°F | Resp 16

## 2011-06-01 DIAGNOSIS — F419 Anxiety disorder, unspecified: Secondary | ICD-10-CM

## 2011-06-01 DIAGNOSIS — F329 Major depressive disorder, single episode, unspecified: Secondary | ICD-10-CM

## 2011-06-01 DIAGNOSIS — F3289 Other specified depressive episodes: Secondary | ICD-10-CM

## 2011-06-01 DIAGNOSIS — M545 Low back pain, unspecified: Secondary | ICD-10-CM

## 2011-06-01 DIAGNOSIS — I10 Essential (primary) hypertension: Secondary | ICD-10-CM

## 2011-06-01 DIAGNOSIS — F411 Generalized anxiety disorder: Secondary | ICD-10-CM

## 2011-06-01 LAB — CBC
Platelets: 327 10*3/uL (ref 150–400)
RBC: 5.54 MIL/uL — ABNORMAL HIGH (ref 3.87–5.11)
RDW: 13.5 % (ref 11.5–15.5)
WBC: 7.7 10*3/uL (ref 4.0–10.5)

## 2011-06-01 LAB — POCT I-STAT, CHEM 8
BUN: <3 mg/dL — ABNORMAL LOW (ref 6–23)
Calcium, Ion: 1.23 mmol/L (ref 1.12–1.32)
Chloride: 98 mEq/L (ref 96–112)
HCT: 54 % — ABNORMAL HIGH (ref 36.0–46.0)
Sodium: 142 mEq/L (ref 135–145)
TCO2: 31 mmol/L (ref 0–100)

## 2011-06-01 LAB — POCT I-STAT TROPONIN I: Troponin i, poc: 0.01 ng/mL (ref 0.00–0.08)

## 2011-06-01 LAB — URINALYSIS, ROUTINE W REFLEX MICROSCOPIC
Glucose, UA: NEGATIVE mg/dL
Ketones, ur: NEGATIVE mg/dL
Nitrite: NEGATIVE
Protein, ur: NEGATIVE mg/dL
pH: 6 (ref 5.0–8.0)

## 2011-06-01 LAB — DIFFERENTIAL
Basophils Absolute: 0 10*3/uL (ref 0.0–0.1)
Eosinophils Relative: 0 % (ref 0–5)
Lymphocytes Relative: 30 % (ref 12–46)
Lymphs Abs: 2.3 10*3/uL (ref 0.7–4.0)
Neutrophils Relative %: 58 % (ref 43–77)

## 2011-06-01 NOTE — Discharge Instructions (Signed)
Arterial Hypertension Arterial hypertension (high blood pressure) is a condition of elevated pressure in your blood vessels. Hypertension over a long period of time is a risk factor for strokes, heart attacks, and heart failure. It is also the leading cause of kidney (renal) failure.  CAUSES   In Adults -- Over 90% of all hypertension has no known cause. This is called essential or primary hypertension. In the other 10% of people with hypertension, the increase in blood pressure is caused by another disorder. This is called secondary hypertension. Important causes of secondary hypertension are:   Heavy alcohol use.   Obstructive sleep apnea.   Hyperaldosterosim (Conn's syndrome).   Steroid use.   Chronic kidney failure.   Hyperparathyroidism.   Medications.   Renal artery stenosis.   Pheochromocytoma.   Cushing's disease.   Coarctation of the aorta.   Scleroderma renal crisis.   Licorice (in excessive amounts).   Drugs (cocaine, methamphetamine).  Your caregiver can explain any items above that apply to you.  In Children -- Secondary hypertension is more common and should always be considered.   Pregnancy -- Few women of childbearing age have high blood pressure. However, up to 10% of them develop hypertension of pregnancy. Generally, this will not harm the woman. It may be a sign of 3 complications of pregnancy: preeclampsia, HELLP syndrome, and eclampsia. Follow up and control with medication is necessary.  SYMPTOMS   This condition normally does not produce any noticeable symptoms. It is usually found during a routine exam.   Malignant hypertension is a late problem of high blood pressure. It may have the following symptoms:   Headaches.   Blurred vision.   End-organ damage (this means your kidneys, heart, lungs, and other organs are being damaged).   Stressful situations can increase the blood pressure. If a person with normal blood pressure has their blood  pressure go up while being seen by their caregiver, this is often termed "white coat hypertension." Its importance is not known. It may be related with eventually developing hypertension or complications of hypertension.   Hypertension is often confused with mental tension, stress, and anxiety.  DIAGNOSIS  The diagnosis is made by 3 separate blood pressure measurements. They are taken at least 1 week apart from each other. If there is organ damage from hypertension, the diagnosis may be made without repeat measurements. Hypertension is usually identified by having blood pressure readings:  Above 140/90 mmHg measured in both arms, at 3 separate times, over a couple weeks.   Over 130/80 mmHg should be considered a risk factor and may require treatment in patients with diabetes.  Blood pressure readings over 120/80 mmHg are called "pre-hypertension" even in non-diabetic patients. To get a true blood pressure measurement, use the following guidelines. Be aware of the factors that can alter blood pressure readings.  Take measurements at least 1 hour after caffeine.   Take measurements 30 minutes after smoking and without any stress. This is another reason to quit smoking - it raises your blood pressure.   Use a proper cuff size. Ask your caregiver if you are not sure about your cuff size.   Most home blood pressure cuffs are automatic. They will measure systolic and diastolic pressures. The systolic pressure is the pressure reading at the start of sounds. Diastolic pressure is the pressure at which the sounds disappear. If you are elderly, measure pressures in multiple postures. Try sitting, lying or standing.   Sit at rest for a minimum of   5 minutes before taking measurements.   You should not be on any medications like decongestants. These are found in many cold medications.   Record your blood pressure readings and review them with your caregiver.  If you have hypertension:  Your caregiver  may do tests to be sure you do not have secondary hypertension (see "causes" above).   Your caregiver may also look for signs of metabolic syndrome. This is also called Syndrome X or Insulin Resistance Syndrome. You may have this syndrome if you have type 2 diabetes, abdominal obesity, and abnormal blood lipids in addition to hypertension.   Your caregiver will take your medical and family history and perform a physical exam.   Diagnostic tests may include blood tests (for glucose, cholesterol, potassium, and kidney function), a urinalysis, or an EKG. Other tests may also be necessary depending on your condition.  PREVENTION  There are important lifestyle issues that you can adopt to reduce your chance of developing hypertension:  Maintain a normal weight.   Limit the amount of salt (sodium) in your diet.   Exercise often.   Limit alcohol intake.   Get enough potassium in your diet. Discuss specific advice with your caregiver.   Follow a DASH diet (dietary approaches to stop hypertension). This diet is rich in fruits, vegetables, and low-fat dairy products, and avoids certain fats.  PROGNOSIS  Essential hypertension cannot be cured. Lifestyle changes and medical treatment can lower blood pressure and reduce complications. The prognosis of secondary hypertension depends on the underlying cause. Many people whose hypertension is controlled with medicine or lifestyle changes can live a normal, healthy life.  RISKS AND COMPLICATIONS  While high blood pressure alone is not an illness, it often requires treatment due to its short- and long-term effects on many organs. Hypertension increases your risk for:  CVAs or strokes (cerebrovascular accident).   Heart failure due to chronically high blood pressure (hypertensive cardiomyopathy).   Heart attack (myocardial infarction).   Damage to the retina (hypertensive retinopathy).   Kidney failure (hypertensive nephropathy).  Your caregiver can  explain list items above that apply to you. Treatment of hypertension can significantly reduce the risk of complications. TREATMENT   For overweight patients, weight loss and regular exercise are recommended. Physical fitness lowers blood pressure.   Mild hypertension is usually treated with diet and exercise. A diet rich in fruits and vegetables, fat-free dairy products, and foods low in fat and salt (sodium) can help lower blood pressure. Decreasing salt intake decreases blood pressure in a 1/3 of people.   Stop smoking if you are a smoker.  The steps above are highly effective in reducing blood pressure. While these actions are easy to suggest, they are difficult to achieve. Most patients with moderate or severe hypertension end up requiring medications to bring their blood pressure down to a normal level. There are several classes of medications for treatment. Blood pressure pills (antihypertensives) will lower blood pressure by their different actions. Lowering the blood pressure by 10 mmHg may decrease the risk of complications by as much as 25%. The goal of treatment is effective blood pressure control. This will reduce your risk for complications. Your caregiver will help you determine the best treatment for you according to your lifestyle. What is excellent treatment for one person, may not be for you. HOME CARE INSTRUCTIONS   Do not smoke.   Follow the lifestyle changes outlined in the "Prevention" section.   If you are on medications, follow the directions   carefully. Blood pressure medications must be taken as prescribed. Skipping doses reduces their benefit. It also puts you at risk for problems.   Follow up with your caregiver, as directed.   If you are asked to monitor your blood pressure at home, follow the guidelines in the "Diagnosis" section above.  SEEK MEDICAL CARE IF:   You think you are having medication side effects.   You have recurrent headaches or lightheadedness.     You have swelling in your ankles.   You have trouble with your vision.  SEEK IMMEDIATE MEDICAL CARE IF:   You have sudden onset of chest pain or pressure, difficulty breathing, or other symptoms of a heart attack.   You have a severe headache.   You have symptoms of a stroke (such as sudden weakness, difficulty speaking, difficulty walking).  MAKE SURE YOU:   Understand these instructions.   Will watch your condition.   Will get help right away if you are not doing well or get worse.  Document Released: 03/01/2005 Document Revised: 02/18/2011 Document Reviewed: 09/29/2006 [employer] Patient Information 2012 [employer]. 

## 2011-06-01 NOTE — Assessment & Plan Note (Signed)
Continue with current prescription therapy as reflected on the Med list.  

## 2011-06-01 NOTE — Progress Notes (Signed)
Patient ID: Veronica [name], female   DOB: 02/17/1951, 61 y.o.   MRN: [id number] Patient ID: Veronica [name], female   DOB: August 31, 1950, 61 y.o.   MRN: [id number]  Subjective:    Patient ID: Veronica [name], female    DOB: 1950-11-22, 61 y.o.   MRN: [id number]  Hypertension Pertinent negatives include no headaches or shortness of breath.  Allergic Reaction Associated symptoms include diarrhea and vomiting. Pertinent negatives include no abdominal pain, coughing or rash.  Diarrhea  Associated symptoms include vomiting. Pertinent negatives include no abdominal pain, chills, coughing or headaches.    Walked in following her ER visit for dizziness, nausea and high BP... The patient is here to follow up on chronic marital stress, depression, anxiety, headaches and chronic moderate fibromyalgia symptoms, not controlled now. C/o nausea and vomiting w/her latest med (Viibrid, Effexor). Effexor was given by a psychiatrist. Took 1 dose. SBP=168-180 at home. C/o insomnia.  Wt Readings from Last 3 Encounters:  05/04/11 101 lb (45.813 kg)  04/13/11 106 lb (48.081 kg)  03/24/11 104 lb (47.174 kg)   BP Readings from Last 3 Encounters:  06/01/11 148/60  06/01/11 160/83  05/04/11 168/90      Review of Systems  Constitutional: Positive for fatigue. Negative for chills, activity change, appetite change and unexpected weight change.  HENT: Negative for congestion, mouth sores and sinus pressure.   Eyes: Negative for visual disturbance.  Respiratory: Negative for cough, chest tightness and shortness of breath.   Gastrointestinal: Positive for nausea, vomiting and diarrhea. Negative for abdominal pain.  Genitourinary: Negative for frequency, decreased urine volume, difficulty urinating and vaginal pain.  Musculoskeletal: Negative for back pain and gait problem.  Skin: Negative for pallor and rash.  Neurological: Negative for dizziness, tremors, weakness, numbness and headaches.  Hematological: Negative for  adenopathy.  Psychiatric/Behavioral: Positive for suicidal ideas, sleep disturbance and dysphoric mood. Negative for hallucinations, confusion and agitation. The patient is nervous/anxious.        Objective:   Physical Exam  Constitutional: She appears well-developed. No distress.  HENT:  Head: Normocephalic.  Right Ear: External ear normal.  Left Ear: External ear normal.  Nose: Nose normal.  Mouth/Throat: Oropharynx is clear and moist.  Eyes: Conjunctivae are normal. Pupils are equal, round, and reactive to light. Right eye exhibits no discharge. Left eye exhibits no discharge.  Neck: Normal range of motion. Neck supple. No JVD present. No tracheal deviation present. No thyromegaly present.  Cardiovascular: Normal rate, regular rhythm and normal heart sounds.   Pulmonary/Chest: No stridor. No respiratory distress. She has no wheezes.  Abdominal: Soft. Bowel sounds are normal. She exhibits no distension and no mass. There is no tenderness. There is no rebound and no guarding.  Musculoskeletal: She exhibits no edema and no tenderness.  Lymphadenopathy:    She has no cervical adenopathy.  Neurological: She displays normal reflexes. No cranial nerve deficit. She exhibits normal muscle tone. Coordination normal.  Skin: No rash noted. No erythema.  Psychiatric: Her behavior is normal. Judgment and thought content normal.       Anxious, sad  in a w/c  Lab Results  Component Value Date   WBC 7.7 06/01/2011   HGB 18.4* 06/01/2011   HCT 54.0* 06/01/2011   PLT 327 06/01/2011   GLUCOSE 111* 06/01/2011   CHOL 257* 09/26/2009   TRIG 73.0 09/26/2009   HDL 71.40 09/26/2009   LDLDIRECT 165.0 09/26/2009   ALT 15 03/24/2011   AST 17 03/24/2011   NA 142  06/01/2011   K 3.4* 06/01/2011   CL 98 06/01/2011   CREATININE 0.60 06/01/2011   BUN <3* 06/01/2011   CO2 31 03/24/2011   TSH 2.09 03/24/2011   INR 1.0 ratio 11/07/2009         Assessment & Plan:   A complex case.

## 2011-06-01 NOTE — ED Notes (Signed)
MD at bedside. Dr. Palumbo at bedside.  

## 2011-06-01 NOTE — ED Notes (Signed)
Pt states her legs are twitching. States this has been going on since last night off and on.

## 2011-06-01 NOTE — Patient Instructions (Addendum)
Take an extra Verapamil if BP is high Restart Klonopin

## 2011-06-01 NOTE — ED Provider Notes (Signed)
History     CSN: [id number]  Arrival date & time 05/31/11  2132   First MD Initiated Contact with Patient 06/01/11 0157      No chief complaint on file.   (Consider location/radiation/quality/duration/timing/severity/associated sxs/prior treatment) Patient is a 61 y.o. female presenting with hypertension and allergic reaction. The history is provided by the patient.  Hypertension This is a chronic problem. The current episode started more than 1 week ago. The problem occurs constantly. The problem has not changed since onset.Pertinent negatives include no chest pain, no abdominal pain, no headaches and no shortness of breath. The symptoms are aggravated by nothing. The symptoms are relieved by nothing. She has tried nothing for the symptoms. The treatment provided no relief.  Allergic Reaction The primary symptoms are  rash. The primary symptoms do not include wheezing, shortness of breath, cough, abdominal pain, nausea, vomiting, diarrhea, dizziness, palpitations, altered mental status, angioedema or urticaria. The current episode started yesterday. The problem has been resolved. This is a new problem.  The rash is associated with itching.  The onset of the reaction was associated with a new medication (venlafaxine.  Called her therapist but he says she needs to stay on it). Significant symptoms also include itching.  Has has had insomnia x 5 weeks.  And blurry painless vision in the right eye x 5 weeks.  Has not seen her PMD about these issues.  No CP SOB no DOE no n/v/d.  No weakness or numbness no speech difficulties.    Past Medical History  Diagnosis Date  . Diverticulosis of colon 02/17/06  . GERD (gastroesophageal reflux disease)   . Hyperlipidemia   . Hypertension   . Seizure disorder   . Asthma   . LBP (low back pain)   . OA (osteoarthritis)   . Vitamin B 12 deficiency   . Vitamin d deficiency   . Sjogren's disease 2010    per Dr. Manson Passey, DDS  . Migraine headache   .  Gastric ulcer   . AVM (arteriovenous malformation)   . CVA (cerebral infarction)     following brain surgery  . Depression   . Seizure disorder   . Colon polyp 01/04/91    hyperplastic  . Endometriosis   . COPD (chronic obstructive pulmonary disease)     Past Surgical History  Procedure Date  . Mouth surgery   . Nissen fundoplication 1999  . Brain surgery 1991  . Temporomandibular joint surgery 02/2001  . Laparoscopic ovarian cystectomy   . Cholecystectomy     Family History  Problem Relation Age of Onset  . Hypertension Other   . Diabetes Other   . Hypertension Mother   . Heart disease Father   . Pancreatic cancer Father   . Colon cancer Sister   . Diabetes Brother     History  Substance Use Topics  . Smoking status: Never Smoker   . Smokeless tobacco: Never Used  . Alcohol Use: No    OB History    Grav Para Term Preterm Abortions TAB SAB Ect Mult Living                  Review of Systems  Constitutional: Negative.   HENT: Negative for tinnitus.   Eyes: Negative for photophobia and itching.  Respiratory: Negative for cough, shortness of breath and wheezing.   Cardiovascular: Negative for chest pain and palpitations.  Gastrointestinal: Negative for nausea, vomiting, abdominal pain, diarrhea and abdominal distention.  Genitourinary: Negative.   Musculoskeletal: Negative.  Skin: Positive for itching and rash.  Neurological: Negative for dizziness and headaches.  Hematological: Negative.   Psychiatric/Behavioral: Negative.  Negative for altered mental status.    Allergies  Advair hfa; Amantadine hcl; Avelox; Azithromycin; Cephalexin; Ciprofloxacin; Cyproheptadine hcl; Doxycycline hyclate; Erythromycin base; Flagyl; Fluconazole; Guaifenesin; Imipramine hcl; Lansoprazole; Latex; Levothyroxine sodium; Menthol; Metoclopramide hcl; Metronidazole; Montelukast sodium; Moxifloxacin; Oxycodone-acetaminophen; Penicillins; Pirbuterol acetate; Pravastatin sodium;  Propulsid; Reglan; Remeron; Salmeterol xinafoate; Sulfadiazine; Sulfamethoxazole w/trimethoprim; Telithromycin; Tetracycline hcl; Topiramate; Viibryd; Zolpidem tartrate; and Fluoxetine  Home Medications   Current Outpatient Rx  Name Route Sig Dispense Refill  . ACETAMINOPHEN-CODEINE #3 300-30 MG PO TABS Oral Take 1 tablet by mouth 4 (four) times daily as needed.      [name] MAGIC MOUTHWASH Oral Take 5 mLs by mouth 4 (four) times daily. Swish, hold and swallow four times daily. 300 mL 3  . BUDESONIDE 0.25 MG/2ML IN SUSP Nebulization Take 2 mLs (0.25 mg total) by nebulization 3 (three) times daily. 180 mL 5  . CHOLECALCIFEROL 5000 UNITS PO CAPS Oral Take 5,000 Units by mouth daily.      [name] CLONAZEPAM 1 MG PO TABS Oral Take 1 tablet (1 mg total) by mouth 3 (three) times daily as needed for anxiety. 90 tablet 3  . COMBIVENT 18-103 MCG/ACT IN AERO  USE 2 INHALATION 4 TIMES A DAY AS NEEDED 14.7 g 5  . CYANOCOBALAMIN 1000 MCG/ML IJ SOLN Intramuscular Inject 1 mL (1,000 mcg total) into the muscle every 14 (fourteen) days. 10 mL 6  . CYCLOBENZAPRINE HCL 10 MG PO TABS Oral Take 10 mg by mouth 3 (three) times daily as needed.      . INSULIN SYRINGE-NEEDLE U-100 26G X 1/2" 1 ML MISC Subcutaneous Inject 1 each into the skin as directed. 100 each 5  . LORAZEPAM 0.5 MG PO TABS Oral Take 1 tablet (0.5 mg total) by mouth 2 (two) times daily as needed for anxiety. 60 tablet 0  . NYSTATIN 100000 UNIT/ML MT SUSP  SWISH HOLD AND SWALLOW 5 ML FOUR TIMES A DAY 300 mL 0  . FISH OIL 1000 MG PO CAPS Oral Take 1 capsule by mouth 2 (two) times daily.      [name] OMEPRAZOLE 20 MG PO CPDR  Take 2 tablets by mouth every morning and 1 tablet by mouth every evening. 90 capsule 3  . OMEPRAZOLE 20 MG PO CPDR  TAKE ONE CAPSULE BY MOUTH TWICE DAILY 60 capsule 5  . OXYBUTYNIN 3 (28) % (MG/ACT) TD GEL Transdermal Place 1 Act onto the skin 1 day or 1 dose. 276 g 11  . PHENYTOIN SODIUM EXTENDED 100 MG PO CAPS Oral Take 100 mg by mouth 2 (two)  times daily. BMN    . PRAVASTATIN SODIUM 80 MG PO TABS Oral Take 1 tablet (80 mg total) by mouth daily. 30 tablet 2  . PREGABALIN 50 MG PO CAPS Oral Take 50 mg by mouth 2 (two) times daily.      [name] PROMETHAZINE HCL 25 MG PO TABS Oral Take 0.5 tablets (12.5 mg total) by mouth every 6 (six) hours as needed. 30 tablet 1  . PROMETHAZINE-CODEINE 6.25-10 MG/5ML PO SYRP Oral Take 5 mLs by mouth every 4 (four) hours as needed. 240 mL 0  . SUCRALFATE 1 GM/10ML PO SUSP  Take 2 teaspoons by mouth twice daily 340 mL 3  . TRAZODONE HCL 50 MG PO TABS Oral Take 200 mg by mouth at bedtime.     . VENLAFAXINE HCL 37.5 MG PO  TABS Oral Take 37.5 mg by mouth daily.    [name] VERAPAMIL HCL 180 MG PO TBCR Oral Take 180 mg by mouth daily after breakfast.      . XOPENEX 1.25 MG/3ML IN NEBU  USE 1 VIA NEBULIZER 3 TIMES A DAY 288 mL 3  . DULOXETINE HCL 30 MG PO CPEP Oral Take 1 capsule (30 mg total) by mouth daily. 30 capsule 5    BP 171/78  Pulse 85  Temp(Src) 98.1 F (36.7 C) (Oral)  Resp 15  SpO2 98%  Physical Exam  Constitutional: She is oriented to person, place, and time. She appears well-developed and well-nourished. No distress.  HENT:  Head: Normocephalic and atraumatic.  Mouth/Throat: Oropharynx is clear and moist.  Eyes: Conjunctivae are normal. Pupils are equal, round, and reactive to light.       See visual acuity,tonopen pressures 4  Neck: Normal range of motion. Neck supple.  Cardiovascular: Normal rate and regular rhythm.   Pulmonary/Chest: Effort normal and breath sounds normal.  Abdominal: Soft. Bowel sounds are normal.  Neurological: She is alert and oriented to person, place, and time. She has normal reflexes. No cranial nerve deficit.  Skin: Skin is warm and dry.  Psychiatric: Thought content normal.    ED Course  Procedures (including critical care time)  Labs Reviewed  CBC - Abnormal; Notable for the following:    RBC 5.54 (*)    Hemoglobin 16.3 (*)    HCT 47.7 (*)    All other  components within normal limits  URINALYSIS, ROUTINE W REFLEX MICROSCOPIC - Abnormal; Notable for the following:    Hgb urine dipstick TRACE (*)    Leukocytes, UA SMALL (*)    All other components within normal limits  POCT I-STAT, CHEM 8 - Abnormal; Notable for the following:    Potassium 3.4 (*)    BUN <3 (*)    Glucose, Bld 111 (*)    Hemoglobin 18.4 (*)    HCT 54.0 (*)    All other components within normal limits  URINE MICROSCOPIC-ADD ON - Abnormal; Notable for the following:    Squamous Epithelial / LPF MANY (*)    Bacteria, UA FEW (*)    All other components within normal limits  DIFFERENTIAL  POCT I-STAT TROPONIN I   Dg Chest 2 View  06/01/2011  *RADIOLOGY REPORT*  Clinical Data: Hypertension.  CHEST - 2 VIEW  Comparison: Chest radiograph performed 03/21/2008  Findings: The lungs are well-aerated and clear.  There is no evidence of focal opacification, pleural effusion or pneumothorax.  The heart is normal in size; calcification is noted within the aortic arch.  No acute osseous abnormalities are seen. Postoperative changes are seen at the gastroesophageal junction.  IMPRESSION: No acute cardiopulmonary process seen.  Original Report Authenticated By: Tonia Ghent, M.D.   Ct Head Wo Contrast  06/01/2011  *RADIOLOGY REPORT*  Clinical Data: Hypertension; nausea.  History of AVM surgery.  CT HEAD WITHOUT CONTRAST  Technique:  Contiguous axial images were obtained from the base of the skull through the vertex without contrast.  Comparison: None.  Findings: There is no evidence of acute infarction, mass lesion, or intra- or extra-axial hemorrhage on CT.  Postoperative change is noted at the high left frontoparietal region.  Mild cerebellar atrophy is noted.  The brainstem and fourth ventricle are within normal limits.  The third and lateral ventricles, and basal ganglia are unremarkable in appearance.  No mass effect or midline shift is seen.  There is  no evidence of fracture; a healed  left-sided craniotomy flap is noted.  The visualized portions of the orbits are within normal limits.  The paranasal sinuses and mastoid air cells are well-aerated.  No significant soft tissue abnormalities are seen.  IMPRESSION:  1.  No acute intracranial pathology seen on CT. 2.  Postoperative changes noted at the left frontoparietal region.  Original Report Authenticated By: Tonia Ghent, M.D.     1. Hypertension   2. Medication reaction      Date: 06/01/2011  Rate: 67  Rhythm: normal sinus rhythm  QRS Axis: normal  Intervals: normal  ST/T Wave abnormalities: normal  Conduction Disutrbances:none  Narrative Interpretation:   Old EKG Reviewed: none available   MDM  All labs and and radiology studies reassuring.  Patient is instructed to follow up with Dr.  Posey Rea and her optholmologist for ongoing care. Patient and sone verbalize understanding and agree to follow up        Everlie Eble K Devinn Hurwitz-Rasch, MD 06/01/11 0830

## 2011-06-01 NOTE — Assessment & Plan Note (Signed)
Worse - take an extra Verap if needed

## 2011-06-01 NOTE — Assessment & Plan Note (Signed)
Discussed.

## 2011-06-01 NOTE — ED Notes (Signed)
Pt. States that she has not slept well since February and was wanting the EDP to give her something for sleep.

## 2011-06-09 ENCOUNTER — Encounter: Payer: Self-pay | Admitting: Internal Medicine

## 2011-06-09 ENCOUNTER — Ambulatory Visit (INDEPENDENT_AMBULATORY_CARE_PROVIDER_SITE_OTHER): Payer: BC Managed Care – PPO | Admitting: Internal Medicine

## 2011-06-09 VITALS — BP 140/84 | HR 72 | Temp 98.1°F | Wt 99.8 lb

## 2011-06-09 DIAGNOSIS — F419 Anxiety disorder, unspecified: Secondary | ICD-10-CM

## 2011-06-09 DIAGNOSIS — J4489 Other specified chronic obstructive pulmonary disease: Secondary | ICD-10-CM

## 2011-06-09 DIAGNOSIS — F411 Generalized anxiety disorder: Secondary | ICD-10-CM

## 2011-06-09 DIAGNOSIS — E538 Deficiency of other specified B group vitamins: Secondary | ICD-10-CM

## 2011-06-09 DIAGNOSIS — J449 Chronic obstructive pulmonary disease, unspecified: Secondary | ICD-10-CM

## 2011-06-09 DIAGNOSIS — I10 Essential (primary) hypertension: Secondary | ICD-10-CM

## 2011-06-09 DIAGNOSIS — G4700 Insomnia, unspecified: Secondary | ICD-10-CM

## 2011-06-09 DIAGNOSIS — F329 Major depressive disorder, single episode, unspecified: Secondary | ICD-10-CM

## 2011-06-09 DIAGNOSIS — F32A Depression, unspecified: Secondary | ICD-10-CM

## 2011-06-09 DIAGNOSIS — R569 Unspecified convulsions: Secondary | ICD-10-CM

## 2011-06-09 MED ORDER — HYDROXYZINE PAMOATE 50 MG PO CAPS: 50.0000 mg | ORAL_CAPSULE | Freq: Every evening | ORAL | Status: AC | PRN

## 2011-06-09 NOTE — Assessment & Plan Note (Signed)
Situational, chronic

## 2011-06-09 NOTE — Assessment & Plan Note (Signed)
1/13 situational, refractory. Multiple antidepressant intolerance Discussed

## 2011-06-09 NOTE — Assessment & Plan Note (Signed)
We can try Vistaril w/caution at hs. Risk of cross-reaction with other meds and a potential for allergic reaction was discussed Sleep test is scheduled 4/17 at [facility]

## 2011-06-09 NOTE — Assessment & Plan Note (Signed)
Continue with current prescription therapy as reflected on the Med list.  

## 2011-06-09 NOTE — Progress Notes (Signed)
Patient ID: Veronica [name], female   DOB: 1950/10/28, 61 y.o.   MRN: [id number] Patient ID: Veronica [name], female   DOB: 05-21-1950, 61 y.o.   MRN: [id number] Patient ID: Veronica [name], female   DOB: Aug 17, 1950, 61 y.o.   MRN: [id number]  Subjective:    Patient ID: Veronica [name], female    DOB: Sep 30, 1950, 61 y.o.   MRN: [id number]  HPI  She is here to f/u on her ER visit for dizziness, nausea and high BP 1 wk ago...  The patient is here to follow up on chronic marital stress, depression, anxiety, headaches and chronic moderate fibromyalgia symptoms, not controlled now. C/o nausea and vomiting w/her latest med (Viibrid, Effexor). Effexor was given by a psychiatrist. Took 1 dose. SBP=168-180 at home. C/o insomnia. She did not resume Effexor  C/o bad insomnia: 1h of sleep/night  Wt Readings from Last 3 Encounters:  06/09/11 99 lb 12 oz (45.246 kg)  05/04/11 101 lb (45.813 kg)  04/13/11 106 lb (48.081 kg)   BP Readings from Last 3 Encounters:  06/09/11 140/84  06/01/11 148/60  06/01/11 160/83      Review of Systems  Constitutional: Positive for fatigue. Negative for activity change, appetite change and unexpected weight change.  HENT: Negative for congestion, mouth sores and sinus pressure.   Eyes: Negative for visual disturbance.  Respiratory: Negative for chest tightness.   Gastrointestinal: Positive for nausea.  Genitourinary: Negative for frequency, decreased urine volume, difficulty urinating and vaginal pain.  Musculoskeletal: Negative for back pain and gait problem.  Skin: Negative for pallor.  Neurological: Negative for dizziness, tremors, weakness and numbness.  Hematological: Negative for adenopathy.  Psychiatric/Behavioral: Positive for sleep disturbance and dysphoric mood. Negative for suicidal ideas, hallucinations, confusion and agitation. The patient is nervous/anxious.        Objective:   Physical Exam  Constitutional: She appears well-developed. No distress.  HENT:   Head: Normocephalic.  Right Ear: External ear normal.  Left Ear: External ear normal.  Nose: Nose normal.  Mouth/Throat: Oropharynx is clear and moist.  Eyes: Conjunctivae are normal. Pupils are equal, round, and reactive to light. Right eye exhibits no discharge. Left eye exhibits no discharge.  Neck: Normal range of motion. Neck supple. No JVD present. No tracheal deviation present. No thyromegaly present.  Cardiovascular: Normal rate, regular rhythm and normal heart sounds.   Pulmonary/Chest: No stridor. No respiratory distress. She has no wheezes.  Abdominal: Soft. Bowel sounds are normal. She exhibits no distension and no mass. There is no tenderness. There is no rebound and no guarding.  Musculoskeletal: She exhibits no edema and no tenderness.  Lymphadenopathy:    She has no cervical adenopathy.  Neurological: She displays normal reflexes. No cranial nerve deficit. She exhibits normal muscle tone. Coordination normal.  Skin: No rash noted. No erythema.  Psychiatric: Her behavior is normal. Judgment and thought content normal.       Anxious, sad  cane  Lab Results  Component Value Date   WBC 7.7 06/01/2011   HGB 18.4* 06/01/2011   HCT 54.0* 06/01/2011   PLT 327 06/01/2011   GLUCOSE 111* 06/01/2011   CHOL 257* 09/26/2009   TRIG 73.0 09/26/2009   HDL 71.40 09/26/2009   LDLDIRECT 165.0 09/26/2009   ALT 15 03/24/2011   AST 17 03/24/2011   NA 142 06/01/2011   K 3.4* 06/01/2011   CL 98 06/01/2011   CREATININE 0.60 06/01/2011   BUN <3* 06/01/2011   CO2 31 03/24/2011  TSH 2.09 03/24/2011   INR 1.0 ratio 11/07/2009         Assessment & Plan:

## 2011-06-14 ENCOUNTER — Other Ambulatory Visit: Payer: Self-pay | Admitting: Internal Medicine

## 2011-06-14 NOTE — Telephone Encounter (Signed)
Savahna said she used to be on Vibryd 10mg . (the red one).  She took 1/2 for 7 days then took the 10 mg.  She couldn't take the 20 or 40 mg pills due to allergies.

## 2011-06-14 NOTE — Telephone Encounter (Signed)
The hydroOXYzine is not helping Veronica [name] sleep.  It is keeping her awake instead.  Her pharmacist told her it could do that to some people.  Could she have something like Ambien instead?   Her pharmacy is [facility] in [place].

## 2011-06-14 NOTE — Telephone Encounter (Signed)
Stay on Viibrid 10 mg/d OK Zolpidem - call in pls Thx

## 2011-06-15 NOTE — Telephone Encounter (Signed)
Please also call in Viibrid 10 mg/d.  She cannot take the Viibrid 20 (cut in half to make 10 mg) because she is allergic to something in the 20 mg pill.  She does not have a current RX for Viibrid. Also please call in the Zolpidem.

## 2011-06-16 MED ORDER — VILAZODONE HCL 10 MG PO TABS: 10.0000 mg | ORAL_TABLET | Freq: Every day | ORAL | Status: DC

## 2011-06-16 MED ORDER — ZOLPIDEM TARTRATE 10 MG PO TABS: 10.0000 mg | ORAL_TABLET | Freq: Every evening | ORAL | Status: DC | PRN

## 2011-07-01 DIAGNOSIS — R5383 Other fatigue: Secondary | ICD-10-CM | POA: Insufficient documentation

## 2011-07-05 DIAGNOSIS — F332 Major depressive disorder, recurrent severe without psychotic features: Secondary | ICD-10-CM | POA: Insufficient documentation

## 2011-07-12 ENCOUNTER — Ambulatory Visit: Payer: BC Managed Care – PPO | Admitting: Internal Medicine

## 2011-08-03 ENCOUNTER — Encounter: Payer: Self-pay | Admitting: Internal Medicine

## 2011-08-24 ENCOUNTER — Encounter: Payer: Self-pay | Admitting: Internal Medicine

## 2011-08-24 ENCOUNTER — Ambulatory Visit (INDEPENDENT_AMBULATORY_CARE_PROVIDER_SITE_OTHER): Payer: BC Managed Care – PPO | Admitting: Internal Medicine

## 2011-08-24 VITALS — BP 130/82 | HR 80 | Temp 97.3°F | Resp 16 | Wt 100.0 lb

## 2011-08-24 DIAGNOSIS — F411 Generalized anxiety disorder: Secondary | ICD-10-CM

## 2011-08-24 DIAGNOSIS — K219 Gastro-esophageal reflux disease without esophagitis: Secondary | ICD-10-CM

## 2011-08-24 DIAGNOSIS — G4700 Insomnia, unspecified: Secondary | ICD-10-CM

## 2011-08-24 DIAGNOSIS — J45909 Unspecified asthma, uncomplicated: Secondary | ICD-10-CM

## 2011-08-24 DIAGNOSIS — F419 Anxiety disorder, unspecified: Secondary | ICD-10-CM

## 2011-08-24 DIAGNOSIS — J449 Chronic obstructive pulmonary disease, unspecified: Secondary | ICD-10-CM

## 2011-08-24 DIAGNOSIS — I10 Essential (primary) hypertension: Secondary | ICD-10-CM

## 2011-08-24 DIAGNOSIS — R1013 Epigastric pain: Secondary | ICD-10-CM

## 2011-08-24 DIAGNOSIS — M545 Low back pain: Secondary | ICD-10-CM

## 2011-08-24 DIAGNOSIS — E538 Deficiency of other specified B group vitamins: Secondary | ICD-10-CM

## 2011-08-24 DIAGNOSIS — F329 Major depressive disorder, single episode, unspecified: Secondary | ICD-10-CM

## 2011-08-24 DIAGNOSIS — R197 Diarrhea, unspecified: Secondary | ICD-10-CM

## 2011-08-24 MED ORDER — CYANOCOBALAMIN 1000 MCG/ML IJ SOLN
1000.0000 ug | Freq: Once | INTRAMUSCULAR | Status: AC
  Administered 2011-08-24: 1000 ug via INTRAMUSCULAR

## 2011-08-24 NOTE — Assessment & Plan Note (Signed)
Continue with current prescription therapy as reflected on the Med list.  

## 2011-08-24 NOTE — Progress Notes (Signed)
   Subjective:    Patient ID: Veronica [name], female    DOB: [birth year], 61 y.o.   MRN: [id number]  HPI    The patient is here to follow up on chronic marital stress, depression, anxiety, headaches and chronic moderate fibromyalgia symptoms, not controlled now.  C/o insomnia - better. She did not resume Effexor  C/o bad insomnia: 1h of sleep/night  Wt Readings from Last 3 Encounters:  08/24/11 100 lb (45.36 kg)  06/09/11 99 lb 12 oz (45.246 kg)  05/04/11 101 lb (45.813 kg)   BP Readings from Last 3 Encounters:  08/24/11 130/82  06/09/11 140/84  06/01/11 148/60      Review of Systems  Constitutional: Positive for fatigue. Negative for activity change, appetite change and unexpected weight change.  HENT: Negative for congestion, mouth sores and sinus pressure.   Eyes: Negative for visual disturbance.  Respiratory: Negative for chest tightness.   Gastrointestinal: Positive for nausea.  Genitourinary: Negative for frequency, decreased urine volume, difficulty urinating and vaginal pain.  Musculoskeletal: Negative for back pain and gait problem.  Skin: Negative for pallor.  Neurological: Negative for dizziness, tremors, weakness and numbness.  Hematological: Negative for adenopathy.  Psychiatric/Behavioral: Positive for sleep disturbance and dysphoric mood. Negative for suicidal ideas, hallucinations, confusion and agitation. The patient is nervous/anxious.        Objective:   Physical Exam  Constitutional: She appears well-developed. No distress.  HENT:  Head: Normocephalic.  Right Ear: External ear normal.  Left Ear: External ear normal.  Nose: Nose normal.  Mouth/Throat: Oropharynx is clear and moist.  Eyes: Conjunctivae are normal. Pupils are equal, round, and reactive to light. Right eye exhibits no discharge. Left eye exhibits no discharge.  Neck: Normal range of motion. Neck supple. No JVD present. No tracheal deviation present. No thyromegaly present.    Cardiovascular: Normal rate, regular rhythm and normal heart sounds.   Pulmonary/Chest: No stridor. No respiratory distress. She has no wheezes.  Abdominal: Soft. Bowel sounds are normal. She exhibits no distension and no mass. There is no tenderness. There is no rebound and no guarding.  Musculoskeletal: She exhibits no edema and no tenderness.  Lymphadenopathy:    She has no cervical adenopathy.  Neurological: She displays normal reflexes. No cranial nerve deficit. She exhibits normal muscle tone. Coordination normal.  Skin: No rash noted. No erythema.  Psychiatric: Her behavior is normal. Judgment and thought content normal.       Anxious, sad  cane  Lab Results  Component Value Date   WBC 7.7 06/01/2011   HGB 18.4* 06/01/2011   HCT 54.0* 06/01/2011   PLT 327 06/01/2011   GLUCOSE 111* 06/01/2011   CHOL 257* 09/26/2009   TRIG 73.0 09/26/2009   HDL 71.40 09/26/2009   LDLDIRECT 165.0 09/26/2009   ALT 15 03/24/2011   AST 17 03/24/2011   NA 142 06/01/2011   K 3.4* 06/01/2011   CL 98 06/01/2011   CREATININE 0.60 06/01/2011   BUN <3* 06/01/2011   CO2 31 03/24/2011   TSH 2.09 03/24/2011   INR 1.0 ratio 11/07/2009         Assessment & Plan:

## 2011-08-24 NOTE — Assessment & Plan Note (Signed)
Resolved now. 

## 2011-08-25 ENCOUNTER — Encounter: Payer: Self-pay | Admitting: Internal Medicine

## 2011-09-05 ENCOUNTER — Other Ambulatory Visit: Payer: Self-pay | Admitting: Internal Medicine

## 2011-11-18 ENCOUNTER — Other Ambulatory Visit (INDEPENDENT_AMBULATORY_CARE_PROVIDER_SITE_OTHER): Payer: BC Managed Care – PPO

## 2011-11-18 ENCOUNTER — Other Ambulatory Visit: Payer: Self-pay | Admitting: *Deleted

## 2011-11-18 DIAGNOSIS — I10 Essential (primary) hypertension: Secondary | ICD-10-CM

## 2011-11-18 DIAGNOSIS — R569 Unspecified convulsions: Secondary | ICD-10-CM

## 2011-11-18 DIAGNOSIS — E162 Hypoglycemia, unspecified: Secondary | ICD-10-CM

## 2011-11-18 DIAGNOSIS — E538 Deficiency of other specified B group vitamins: Secondary | ICD-10-CM

## 2011-11-18 LAB — CBC WITH DIFFERENTIAL/PLATELET
Basophils Relative: 0.5 % (ref 0.0–3.0)
Eosinophils Relative: 0.6 % (ref 0.0–5.0)
HCT: 41.7 % (ref 36.0–46.0)
Hemoglobin: 13.8 g/dL (ref 12.0–15.0)
Lymphs Abs: 1.7 10*3/uL (ref 0.7–4.0)
MCV: 89.4 fl (ref 78.0–100.0)
Monocytes Absolute: 0.8 10*3/uL (ref 0.1–1.0)
Monocytes Relative: 12.3 % — ABNORMAL HIGH (ref 3.0–12.0)
Neutro Abs: 3.7 10*3/uL (ref 1.4–7.7)
WBC: 6.3 10*3/uL (ref 4.5–10.5)

## 2011-11-18 LAB — BASIC METABOLIC PANEL
BUN: 13 mg/dL (ref 6–23)
CO2: 30 mEq/L (ref 19–32)
Calcium: 9.5 mg/dL (ref 8.4–10.5)
Creatinine, Ser: 0.7 mg/dL (ref 0.4–1.2)
Glucose, Bld: 93 mg/dL (ref 70–99)
Sodium: 140 mEq/L (ref 135–145)

## 2011-11-18 LAB — TSH: TSH: 1.71 u[IU]/mL (ref 0.35–5.50)

## 2011-11-24 ENCOUNTER — Ambulatory Visit: Payer: BC Managed Care – PPO | Admitting: Internal Medicine

## 2011-11-30 ENCOUNTER — Other Ambulatory Visit: Payer: BC Managed Care – PPO

## 2011-11-30 ENCOUNTER — Encounter: Payer: Self-pay | Admitting: Internal Medicine

## 2011-11-30 ENCOUNTER — Ambulatory Visit (INDEPENDENT_AMBULATORY_CARE_PROVIDER_SITE_OTHER): Payer: BC Managed Care – PPO | Admitting: Internal Medicine

## 2011-11-30 VITALS — BP 132/84 | HR 76 | Temp 98.2°F | Resp 16 | Wt 99.0 lb

## 2011-11-30 DIAGNOSIS — L509 Urticaria, unspecified: Secondary | ICD-10-CM

## 2011-11-30 DIAGNOSIS — E559 Vitamin D deficiency, unspecified: Secondary | ICD-10-CM

## 2011-11-30 DIAGNOSIS — J4489 Other specified chronic obstructive pulmonary disease: Secondary | ICD-10-CM

## 2011-11-30 DIAGNOSIS — J449 Chronic obstructive pulmonary disease, unspecified: Secondary | ICD-10-CM

## 2011-11-30 DIAGNOSIS — E538 Deficiency of other specified B group vitamins: Secondary | ICD-10-CM

## 2011-11-30 DIAGNOSIS — E875 Hyperkalemia: Secondary | ICD-10-CM

## 2011-11-30 MED ORDER — IPRATROPIUM-ALBUTEROL 18-103 MCG/ACT IN AERO: 2.0000 | INHALATION_SPRAY | RESPIRATORY_TRACT | Status: DC | PRN

## 2011-11-30 MED ORDER — CYCLOBENZAPRINE HCL 10 MG PO TABS: 10.0000 mg | ORAL_TABLET | Freq: Three times a day (TID) | ORAL | Status: DC | PRN

## 2011-11-30 MED ORDER — TRAZODONE HCL 50 MG PO TABS: 200.0000 mg | ORAL_TABLET | Freq: Every day | ORAL | Status: DC

## 2011-11-30 MED ORDER — PROMETHAZINE HCL 25 MG PO TABS: 12.5000 mg | ORAL_TABLET | Freq: Four times a day (QID) | ORAL | Status: DC | PRN

## 2011-11-30 MED ORDER — CYANOCOBALAMIN 1000 MCG/ML IJ SOLN: 1000.0000 ug | INTRAMUSCULAR | Status: DC

## 2011-11-30 MED ORDER — CYANOCOBALAMIN 1000 MCG/ML IJ SOLN
1000.0000 ug | Freq: Once | INTRAMUSCULAR | Status: AC
  Administered 2011-11-30: 1000 ug via INTRAMUSCULAR

## 2011-11-30 MED ORDER — CLONAZEPAM 1 MG PO TABS: ORAL_TABLET | ORAL | Status: DC

## 2011-11-30 MED ORDER — ACETAMINOPHEN-CODEINE #3 300-30 MG PO TABS: 1.0000 | ORAL_TABLET | ORAL | Status: DC | PRN

## 2011-11-30 MED ORDER — "INSULIN SYRINGE-NEEDLE U-100 26G X 1/2"" 1 ML MISC": 1.0000 | Status: DC

## 2011-11-30 NOTE — Assessment & Plan Note (Signed)
Check allergy labs -- IgE, IgG

## 2011-11-30 NOTE — Assessment & Plan Note (Signed)
Continue with current prescription therapy as reflected on the Med list. Labs  

## 2011-11-30 NOTE — Assessment & Plan Note (Signed)
Continue with current prescription therapy as reflected on the Med list.  

## 2011-12-01 NOTE — Progress Notes (Signed)
   Subjective:    Patient ID: Veronica [name], female    DOB: 1950/06/17, 61 y.o.   MRN: [id number]  HPI    The patient is here to follow up on chronic marital stress, depression, anxiety, headaches and chronic moderate fibromyalgia symptoms, not controlled now.  F/u on insomnia - better.     Wt Readings from Last 3 Encounters:  11/30/11 99 lb (44.906 kg)  08/24/11 100 lb (45.36 kg)  06/09/11 99 lb 12 oz (45.246 kg)   BP Readings from Last 3 Encounters:  11/30/11 132/84  08/24/11 130/82  06/09/11 140/84      Review of Systems  Constitutional: Positive for fatigue. Negative for activity change, appetite change and unexpected weight change.  HENT: Negative for congestion, mouth sores and sinus pressure.   Eyes: Negative for visual disturbance.  Respiratory: Negative for chest tightness.   Gastrointestinal: Positive for nausea.  Genitourinary: Negative for frequency, decreased urine volume, difficulty urinating and vaginal pain.  Musculoskeletal: Negative for back pain and gait problem.  Skin: Negative for pallor.  Neurological: Negative for dizziness, tremors, weakness and numbness.  Hematological: Negative for adenopathy.  Psychiatric/Behavioral: Positive for disturbed wake/sleep cycle. Negative for suicidal ideas, hallucinations, confusion, self-injury, dysphoric mood, decreased concentration and agitation. The patient is nervous/anxious.        Better        Objective:   Physical Exam  Constitutional: She appears well-developed. No distress.  HENT:  Head: Normocephalic.  Right Ear: External ear normal.  Left Ear: External ear normal.  Nose: Nose normal.  Mouth/Throat: Oropharynx is clear and moist.  Eyes: Conjunctivae normal are normal. Pupils are equal, round, and reactive to light. Right eye exhibits no discharge. Left eye exhibits no discharge.  Neck: Normal range of motion. Neck supple. No JVD present. No tracheal deviation present. No thyromegaly present.    Cardiovascular: Normal rate, regular rhythm and normal heart sounds.   Pulmonary/Chest: No stridor. No respiratory distress. She has no wheezes.  Abdominal: Soft. Bowel sounds are normal. She exhibits no distension and no mass. There is no tenderness. There is no rebound and no guarding.  Musculoskeletal: She exhibits no edema and no tenderness.  Lymphadenopathy:    She has no cervical adenopathy.  Neurological: She displays normal reflexes. No cranial nerve deficit. She exhibits normal muscle tone. Coordination normal.  Skin: No rash noted. No erythema.  Psychiatric: Her behavior is normal. Judgment and thought content normal.       Anxious, sad  cane  Lab Results  Component Value Date   WBC 6.3 11/18/2011   HGB 13.8 11/18/2011   HCT 41.7 11/18/2011   PLT 258.0 11/18/2011   GLUCOSE 93 11/18/2011   CHOL 257* 09/26/2009   TRIG 73.0 09/26/2009   HDL 71.40 09/26/2009   LDLDIRECT 165.0 09/26/2009   ALT 15 03/24/2011   AST 17 03/24/2011   NA 140 11/18/2011   K 5.3* 11/18/2011   CL 104 11/18/2011   CREATININE 0.7 11/18/2011   BUN 13 11/18/2011   CO2 30 11/18/2011   TSH 1.71 11/18/2011   INR 1.0 ratio 11/07/2009         Assessment & Plan:

## 2011-12-03 LAB — ALLERGEN FOOD PROFILE SPECIFIC IGE
Chicken IgE: <0.1 kU/L
Egg White IgE: <0.1 kU/L
Milk IgE: <0.1 kU/L
Orange: <0.1 kU/L
Shrimp IgE: 0.15 kU/L — ABNORMAL HIGH
Tomato IgE: <0.1 kU/L
Tuna IgE: <0.1 kU/L

## 2011-12-07 LAB — IGG FOOD PANEL
Chicken, IgG: <0.15 ug/mL (ref <0.15)
Corn, IgG: <0.15 ug/mL (ref <0.15)

## 2011-12-08 ENCOUNTER — Telehealth: Payer: Self-pay | Admitting: Internal Medicine

## 2011-12-08 NOTE — Telephone Encounter (Signed)
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 Try gluten free bread (slight allergy to wheat)  Please, mail the labs to the patient.    Thx

## 2011-12-08 NOTE — Telephone Encounter (Signed)
Left detailed mess informing pt of below. Copies mailed.  

## 2011-12-10 ENCOUNTER — Other Ambulatory Visit: Payer: Self-pay | Admitting: Internal Medicine

## 2012-05-05 ENCOUNTER — Ambulatory Visit (INDEPENDENT_AMBULATORY_CARE_PROVIDER_SITE_OTHER): Payer: Medicare Other | Admitting: Internal Medicine

## 2012-05-05 ENCOUNTER — Encounter: Payer: Self-pay | Admitting: Internal Medicine

## 2012-05-05 VITALS — BP 120/70 | HR 72 | Temp 98.0°F | Resp 16 | Wt 98.0 lb

## 2012-05-05 DIAGNOSIS — J449 Chronic obstructive pulmonary disease, unspecified: Secondary | ICD-10-CM

## 2012-05-05 DIAGNOSIS — L509 Urticaria, unspecified: Secondary | ICD-10-CM

## 2012-05-05 DIAGNOSIS — J45909 Unspecified asthma, uncomplicated: Secondary | ICD-10-CM

## 2012-05-05 MED ORDER — MIRABEGRON ER 50 MG PO TB24: 50.0000 mg | ORAL_TABLET | Freq: Every day | ORAL | Status: DC

## 2012-05-05 MED ORDER — PRAVASTATIN SODIUM 80 MG PO TABS: 80.0000 mg | ORAL_TABLET | Freq: Every day | ORAL | Status: DC

## 2012-05-05 MED ORDER — "INSULIN SYRINGE-NEEDLE U-100 26G X 1/2"" 1 ML MISC": 1.0000 | Status: DC

## 2012-05-05 MED ORDER — LEVALBUTEROL HCL 1.25 MG/3ML IN NEBU: 1.2500 mg | INHALATION_SOLUTION | RESPIRATORY_TRACT | Status: DC | PRN

## 2012-05-05 MED ORDER — BUDESONIDE 0.25 MG/2ML IN SUSP: 0.2500 mg | Freq: Three times a day (TID) | RESPIRATORY_TRACT | Status: DC

## 2012-05-05 MED ORDER — PROMETHAZINE-CODEINE 6.25-10 MG/5ML PO SYRP: 5.0000 mL | ORAL_SOLUTION | ORAL | Status: DC | PRN

## 2012-05-05 MED ORDER — COMBIVENT 18-103 MCG/ACT IN AERO: 2.0000 | INHALATION_SPRAY | RESPIRATORY_TRACT | Status: DC | PRN

## 2012-05-05 MED ORDER — CYANOCOBALAMIN 1000 MCG/ML IJ SOLN
1000.0000 ug | Freq: Once | INTRAMUSCULAR | Status: AC
  Administered 2012-05-05: 1000 ug via INTRAMUSCULAR

## 2012-05-05 MED ORDER — CYANOCOBALAMIN 1000 MCG/ML IJ SOLN: 1000.0000 ug | INTRAMUSCULAR | Status: DC

## 2012-05-05 MED ORDER — OMEPRAZOLE 20 MG PO CPDR: 20.0000 mg | DELAYED_RELEASE_CAPSULE | Freq: Two times a day (BID) | ORAL | Status: DC

## 2012-05-05 NOTE — Assessment & Plan Note (Addendum)
Try milk-free diet x 1 mo or gluten free diet x 1 mo

## 2012-05-05 NOTE — Patient Instructions (Signed)
Try milk-free diet x 1 month or gluten free diet x 1 month please

## 2012-05-05 NOTE — Progress Notes (Signed)
   Subjective:    HPI    The patient is here to follow up on chronic marital stress, depression, anxiety, headaches and chronic moderate fibromyalgia symptoms, not controlled now.  F/u on insomnia - better. Hives are better...    Wt Readings from Last 3 Encounters:  05/05/12 98 lb (44.453 kg)  11/30/11 99 lb (44.906 kg)  08/24/11 100 lb (45.36 kg)   BP Readings from Last 3 Encounters:  05/05/12 120/70  11/30/11 132/84  08/24/11 130/82      Review of Systems  Constitutional: Positive for fatigue. Negative for activity change, appetite change and unexpected weight change.  HENT: Negative for congestion, mouth sores and sinus pressure.   Eyes: Negative for visual disturbance.  Respiratory: Negative for chest tightness.   Gastrointestinal: Positive for nausea.  Genitourinary: Negative for frequency, decreased urine volume, difficulty urinating and vaginal pain.  Musculoskeletal: Negative for back pain and gait problem.  Skin: Negative for pallor.  Neurological: Negative for dizziness, tremors, weakness and numbness.  Hematological: Negative for adenopathy.  Psychiatric/Behavioral: Positive for sleep disturbance. Negative for suicidal ideas, hallucinations, confusion, self-injury, dysphoric mood, decreased concentration and agitation. The patient is nervous/anxious.        Better        Objective:   Physical Exam  Constitutional: She appears well-developed. No distress.  HENT:  Head: Normocephalic.  Right Ear: External ear normal.  Left Ear: External ear normal.  Nose: Nose normal.  Mouth/Throat: Oropharynx is clear and moist.  Eyes: Conjunctivae are normal. Pupils are equal, round, and reactive to light. Right eye exhibits no discharge. Left eye exhibits no discharge.  Neck: Normal range of motion. Neck supple. No JVD present. No tracheal deviation present. No thyromegaly present.  Cardiovascular: Normal rate, regular rhythm and normal heart sounds.   Pulmonary/Chest:  No stridor. No respiratory distress. She has no wheezes.  Abdominal: Soft. Bowel sounds are normal. She exhibits no distension and no mass. There is no tenderness. There is no rebound and no guarding.  Musculoskeletal: She exhibits no edema and no tenderness.  Lymphadenopathy:    She has no cervical adenopathy.  Neurological: She displays normal reflexes. No cranial nerve deficit. She exhibits normal muscle tone. Coordination normal.  Skin: No rash noted. No erythema.  Psychiatric: Her behavior is normal. Judgment and thought content normal.  Anxious, sad  cane  Lab Results  Component Value Date   WBC 6.3 11/18/2011   HGB 13.8 11/18/2011   HCT 41.7 11/18/2011   PLT 258.0 11/18/2011   GLUCOSE 93 11/18/2011   CHOL 257* 09/26/2009   TRIG 73.0 09/26/2009   HDL 71.40 09/26/2009   LDLDIRECT 165.0 09/26/2009   ALT 15 03/24/2011   AST 17 03/24/2011   NA 140 11/18/2011   K 5.3* 11/18/2011   CL 104 11/18/2011   CREATININE 0.7 11/18/2011   BUN 13 11/18/2011   CO2 30 11/18/2011   TSH 1.71 11/18/2011   INR 1.0 ratio 11/07/2009         Assessment & Plan:

## 2012-06-15 ENCOUNTER — Encounter: Payer: Self-pay | Admitting: Internal Medicine

## 2012-09-27 ENCOUNTER — Other Ambulatory Visit (INDEPENDENT_AMBULATORY_CARE_PROVIDER_SITE_OTHER): Payer: BC Managed Care – PPO

## 2012-09-27 ENCOUNTER — Encounter: Payer: Self-pay | Admitting: Internal Medicine

## 2012-09-27 ENCOUNTER — Ambulatory Visit (INDEPENDENT_AMBULATORY_CARE_PROVIDER_SITE_OTHER): Payer: Medicare Other | Admitting: Internal Medicine

## 2012-09-27 VITALS — BP 140/72 | HR 72 | Temp 97.8°F | Resp 16 | Wt 98.0 lb

## 2012-09-27 DIAGNOSIS — E875 Hyperkalemia: Secondary | ICD-10-CM

## 2012-09-27 DIAGNOSIS — J45909 Unspecified asthma, uncomplicated: Secondary | ICD-10-CM

## 2012-09-27 DIAGNOSIS — F329 Major depressive disorder, single episode, unspecified: Secondary | ICD-10-CM

## 2012-09-27 DIAGNOSIS — E538 Deficiency of other specified B group vitamins: Secondary | ICD-10-CM

## 2012-09-27 DIAGNOSIS — E559 Vitamin D deficiency, unspecified: Secondary | ICD-10-CM

## 2012-09-27 DIAGNOSIS — F32A Depression, unspecified: Secondary | ICD-10-CM

## 2012-09-27 DIAGNOSIS — J4489 Other specified chronic obstructive pulmonary disease: Secondary | ICD-10-CM

## 2012-09-27 DIAGNOSIS — J449 Chronic obstructive pulmonary disease, unspecified: Secondary | ICD-10-CM

## 2012-09-27 DIAGNOSIS — I10 Essential (primary) hypertension: Secondary | ICD-10-CM

## 2012-09-27 DIAGNOSIS — L509 Urticaria, unspecified: Secondary | ICD-10-CM

## 2012-09-27 LAB — BASIC METABOLIC PANEL
Calcium: 9.8 mg/dL (ref 8.4–10.5)
GFR: 109.24 mL/min (ref >60.00)
Glucose, Bld: 88 mg/dL (ref 70–99)
Sodium: 140 mEq/L (ref 135–145)

## 2012-09-27 LAB — VITAMIN B12: Vitamin B-12: 862 pg/mL (ref 211–911)

## 2012-09-27 LAB — HEPATIC FUNCTION PANEL
Albumin: 4.4 g/dL (ref 3.5–5.2)
Alkaline Phosphatase: 70 U/L (ref 39–117)

## 2012-09-27 MED ORDER — SUCRALFATE 1 GM/10ML PO SUSP: ORAL | Status: DC

## 2012-09-27 MED ORDER — CYANOCOBALAMIN 1000 MCG/ML IJ SOLN: 1000.0000 ug | INTRAMUSCULAR | Status: DC

## 2012-09-27 MED ORDER — "INSULIN SYRINGE-NEEDLE U-100 26G X 1/2"" 1 ML MISC": 1.0000 | Status: DC

## 2012-09-27 NOTE — Progress Notes (Signed)
Patient ID: Veronica [name], female   DOB: Jan 16, 1951, 62 y.o.   MRN: [id number]   Subjective:    HPI    The patient is here to follow up on chronic marital stress, depression, anxiety, headaches and chronic moderate fibromyalgia symptoms, not controlled now.  F/u on insomnia - better. Hives are better...    Wt Readings from Last 3 Encounters:  09/27/12 98 lb (44.453 kg)  05/05/12 98 lb (44.453 kg)  11/30/11 99 lb (44.906 kg)   BP Readings from Last 3 Encounters:  09/27/12 140/72  05/05/12 120/70  11/30/11 132/84      Review of Systems  Constitutional: Positive for fatigue. Negative for activity change, appetite change and unexpected weight change.  HENT: Negative for congestion, mouth sores and sinus pressure.   Eyes: Negative for visual disturbance.  Respiratory: Negative for chest tightness.   Gastrointestinal: Positive for nausea.  Genitourinary: Negative for frequency, decreased urine volume, difficulty urinating and vaginal pain.  Musculoskeletal: Negative for back pain and gait problem.  Skin: Negative for pallor.  Neurological: Negative for dizziness, tremors, weakness and numbness.  Hematological: Negative for adenopathy.  Psychiatric/Behavioral: Positive for sleep disturbance. Negative for suicidal ideas, hallucinations, confusion, self-injury, dysphoric mood, decreased concentration and agitation. The patient is nervous/anxious.        Better        Objective:   Physical Exam  Constitutional: She appears well-developed. No distress.  HENT:  Head: Normocephalic.  Right Ear: External ear normal.  Left Ear: External ear normal.  Nose: Nose normal.  Mouth/Throat: Oropharynx is clear and moist.  Eyes: Conjunctivae are normal. Pupils are equal, round, and reactive to light. Right eye exhibits no discharge. Left eye exhibits no discharge.  Neck: Normal range of motion. Neck supple. No JVD present. No tracheal deviation present. No thyromegaly present.   Cardiovascular: Normal rate, regular rhythm and normal heart sounds.   Pulmonary/Chest: No stridor. No respiratory distress. She has no wheezes.  Abdominal: Soft. Bowel sounds are normal. She exhibits no distension and no mass. There is no tenderness. There is no rebound and no guarding.  Musculoskeletal: She exhibits no edema and no tenderness.  Lymphadenopathy:    She has no cervical adenopathy.  Neurological: She displays normal reflexes. No cranial nerve deficit. She exhibits normal muscle tone. Coordination normal.  Skin: No rash noted. No erythema.  Psychiatric: Her behavior is normal. Judgment and thought content normal.  Anxious, sad  cane  Lab Results  Component Value Date   WBC 6.3 11/18/2011   HGB 13.8 11/18/2011   HCT 41.7 11/18/2011   PLT 258.0 11/18/2011   GLUCOSE 93 11/18/2011   CHOL 257* 09/26/2009   TRIG 73.0 09/26/2009   HDL 71.40 09/26/2009   LDLDIRECT 165.0 09/26/2009   ALT 15 03/24/2011   AST 17 03/24/2011   NA 140 11/18/2011   K 5.3* 11/18/2011   CL 104 11/18/2011   CREATININE 0.7 11/18/2011   BUN 13 11/18/2011   CO2 30 11/18/2011   TSH 1.71 11/18/2011   INR 1.0 ratio 11/07/2009         Assessment & Plan:

## 2012-09-29 ENCOUNTER — Telehealth: Payer: Self-pay | Admitting: Internal Medicine

## 2012-09-29 NOTE — Telephone Encounter (Signed)
Requesting a call with lab results.  Also wants a copy mailed.

## 2012-09-30 NOTE — Assessment & Plan Note (Signed)
Continue with current prescription therapy as reflected on the Med list.  

## 2012-10-02 ENCOUNTER — Telehealth: Payer: Self-pay | Admitting: Internal Medicine

## 2012-10-02 NOTE — Telephone Encounter (Signed)
Pt wants a call with lab results and for a copy to be mailed to her.

## 2012-10-02 NOTE — Telephone Encounter (Signed)
Pt informed of labs

## 2012-12-29 ENCOUNTER — Encounter: Payer: Self-pay | Admitting: Internal Medicine

## 2013-01-09 ENCOUNTER — Encounter: Payer: Self-pay | Admitting: Internal Medicine

## 2013-01-09 ENCOUNTER — Ambulatory Visit (INDEPENDENT_AMBULATORY_CARE_PROVIDER_SITE_OTHER): Payer: BC Managed Care – PPO | Admitting: Internal Medicine

## 2013-01-09 VITALS — BP 120/60 | HR 80 | Temp 98.5°F | Resp 16 | Wt 97.0 lb

## 2013-01-09 DIAGNOSIS — E875 Hyperkalemia: Secondary | ICD-10-CM

## 2013-01-09 DIAGNOSIS — R238 Other skin changes: Secondary | ICD-10-CM

## 2013-01-09 DIAGNOSIS — I10 Essential (primary) hypertension: Secondary | ICD-10-CM

## 2013-01-09 DIAGNOSIS — E559 Vitamin D deficiency, unspecified: Secondary | ICD-10-CM

## 2013-01-09 DIAGNOSIS — L509 Urticaria, unspecified: Secondary | ICD-10-CM

## 2013-01-09 DIAGNOSIS — J449 Chronic obstructive pulmonary disease, unspecified: Secondary | ICD-10-CM

## 2013-01-09 DIAGNOSIS — E538 Deficiency of other specified B group vitamins: Secondary | ICD-10-CM

## 2013-01-09 DIAGNOSIS — E162 Hypoglycemia, unspecified: Secondary | ICD-10-CM

## 2013-01-09 MED ORDER — PRAVASTATIN SODIUM 80 MG PO TABS: 80.0000 mg | ORAL_TABLET | Freq: Every day | ORAL | Status: DC

## 2013-01-09 MED ORDER — CIPRODEX 0.3-0.1 % OT SUSP: 4.0000 [drp] | Freq: Two times a day (BID) | OTIC | Status: DC

## 2013-01-09 MED ORDER — BUDESONIDE 0.25 MG/2ML IN SUSP: 0.2500 mg | Freq: Three times a day (TID) | RESPIRATORY_TRACT | Status: DC

## 2013-01-09 MED ORDER — CYCLOBENZAPRINE HCL 10 MG PO TABS: 10.0000 mg | ORAL_TABLET | Freq: Three times a day (TID) | ORAL | Status: DC | PRN

## 2013-01-09 MED ORDER — PROMETHAZINE-CODEINE 6.25-10 MG/5ML PO SYRP: 5.0000 mL | ORAL_SOLUTION | ORAL | Status: DC | PRN

## 2013-01-09 MED ORDER — LEVALBUTEROL HCL 1.25 MG/3ML IN NEBU: 1.2500 mg | INHALATION_SOLUTION | RESPIRATORY_TRACT | Status: DC | PRN

## 2013-01-09 MED ORDER — COMBIVENT 18-103 MCG/ACT IN AERO: 2.0000 | INHALATION_SPRAY | RESPIRATORY_TRACT | Status: DC | PRN

## 2013-01-09 MED ORDER — CIPROFLOXACIN-DEXAMETHASONE 0.3-0.1 % OT SUSP: 4.0000 [drp] | Freq: Two times a day (BID) | OTIC | Status: DC

## 2013-01-09 NOTE — Assessment & Plan Note (Signed)
Continue with current prescription therapy as reflected on the Med list.  

## 2013-01-09 NOTE — Progress Notes (Signed)
   Subjective:    HPI    The patient is here to follow up on marital stress - she is separated now, depression, anxiety, headaches and chronic moderate fibromyalgia symptoms, not controlled now.  F/u on insomnia - better. Hives are better...    Wt Readings from Last 3 Encounters:  01/09/13 97 lb (43.999 kg)  09/27/12 98 lb (44.453 kg)  05/05/12 98 lb (44.453 kg)   BP Readings from Last 3 Encounters:  01/09/13 120/60  09/27/12 140/72  05/05/12 120/70      Review of Systems  Constitutional: Positive for fatigue. Negative for activity change, appetite change and unexpected weight change.  HENT: Negative for congestion, mouth sores and sinus pressure.   Eyes: Negative for visual disturbance.  Respiratory: Negative for chest tightness.   Gastrointestinal: Positive for nausea.  Genitourinary: Negative for frequency, decreased urine volume, difficulty urinating and vaginal pain.  Musculoskeletal: Negative for back pain and gait problem.  Skin: Negative for pallor.  Neurological: Negative for dizziness, tremors, weakness and numbness.  Hematological: Negative for adenopathy.  Psychiatric/Behavioral: Positive for sleep disturbance. Negative for suicidal ideas, hallucinations, confusion, self-injury, dysphoric mood, decreased concentration and agitation. The patient is nervous/anxious.        Better        Objective:   Physical Exam  Constitutional: She appears well-developed. No distress.  HENT:  Head: Normocephalic.  Right Ear: External ear normal.  Left Ear: External ear normal.  Nose: Nose normal.  Mouth/Throat: Oropharynx is clear and moist.  Eyes: Conjunctivae are normal. Pupils are equal, round, and reactive to light. Right eye exhibits no discharge. Left eye exhibits no discharge.  Neck: Normal range of motion. Neck supple. No JVD present. No tracheal deviation present. No thyromegaly present.  Cardiovascular: Normal rate, regular rhythm and normal heart sounds.    Pulmonary/Chest: No stridor. No respiratory distress. She has no wheezes.  Abdominal: Soft. Bowel sounds are normal. She exhibits no distension and no mass. There is no tenderness. There is no rebound and no guarding.  Musculoskeletal: She exhibits no edema and no tenderness.  Lymphadenopathy:    She has no cervical adenopathy.  Neurological: She displays normal reflexes. No cranial nerve deficit. She exhibits normal muscle tone. Coordination normal.  Skin: No rash noted. No erythema.  Psychiatric: Her behavior is normal. Judgment and thought content normal.  Anxious, sad  cane  Lab Results  Component Value Date   WBC 6.3 11/18/2011   HGB 13.8 11/18/2011   HCT 41.7 11/18/2011   PLT 258.0 11/18/2011   GLUCOSE 88 09/27/2012   CHOL 257* 09/26/2009   TRIG 73.0 09/26/2009   HDL 71.40 09/26/2009   LDLDIRECT 165.0 09/26/2009   ALT 15 09/27/2012   AST 17 09/27/2012   NA 140 09/27/2012   K 4.1 09/27/2012   CL 98 09/27/2012   CREATININE 0.6 09/27/2012   BUN 7 09/27/2012   CO2 31 09/27/2012   TSH 1.71 11/18/2011   INR 1.0 ratio 11/07/2009         Assessment & Plan:

## 2013-01-09 NOTE — Assessment & Plan Note (Signed)
No change 

## 2013-06-12 ENCOUNTER — Ambulatory Visit (INDEPENDENT_AMBULATORY_CARE_PROVIDER_SITE_OTHER): Payer: Medicare Other | Admitting: Internal Medicine

## 2013-06-12 ENCOUNTER — Encounter: Payer: Self-pay | Admitting: Internal Medicine

## 2013-06-12 VITALS — BP 124/80 | HR 76 | Temp 97.0°F | Resp 16 | Wt 91.0 lb

## 2013-06-12 DIAGNOSIS — M545 Low back pain, unspecified: Secondary | ICD-10-CM

## 2013-06-12 DIAGNOSIS — L509 Urticaria, unspecified: Secondary | ICD-10-CM

## 2013-06-12 DIAGNOSIS — E538 Deficiency of other specified B group vitamins: Secondary | ICD-10-CM

## 2013-06-12 DIAGNOSIS — J45909 Unspecified asthma, uncomplicated: Secondary | ICD-10-CM

## 2013-06-12 DIAGNOSIS — E559 Vitamin D deficiency, unspecified: Secondary | ICD-10-CM

## 2013-06-12 DIAGNOSIS — F411 Generalized anxiety disorder: Secondary | ICD-10-CM

## 2013-06-12 DIAGNOSIS — E875 Hyperkalemia: Secondary | ICD-10-CM

## 2013-06-12 DIAGNOSIS — J449 Chronic obstructive pulmonary disease, unspecified: Secondary | ICD-10-CM

## 2013-06-12 DIAGNOSIS — F419 Anxiety disorder, unspecified: Secondary | ICD-10-CM

## 2013-06-12 DIAGNOSIS — J40 Bronchitis, not specified as acute or chronic: Secondary | ICD-10-CM

## 2013-06-12 MED ORDER — CYCLOBENZAPRINE HCL 10 MG PO TABS: 10.0000 mg | ORAL_TABLET | Freq: Three times a day (TID) | ORAL | Status: DC | PRN

## 2013-06-12 MED ORDER — LEVAQUIN 500 MG PO TABS: 500.0000 mg | ORAL_TABLET | Freq: Every day | ORAL | Status: DC

## 2013-06-12 MED ORDER — METHYLPREDNISOLONE ACETATE 80 MG/ML IJ SUSP
80.0000 mg | Freq: Once | INTRAMUSCULAR | Status: AC
  Administered 2013-06-12: 80 mg via INTRAMUSCULAR

## 2013-06-12 MED ORDER — "INSULIN SYRINGE-NEEDLE U-100 26G X 1/2"" 1 ML MISC": 1.0000 | Status: DC

## 2013-06-12 MED ORDER — CYANOCOBALAMIN 1000 MCG/ML IJ SOLN: 1000.0000 ug | INTRAMUSCULAR | Status: DC

## 2013-06-12 NOTE — Assessment & Plan Note (Signed)
Continue with current prescription therapy as reflected on the Med list.  

## 2013-06-12 NOTE — Assessment & Plan Note (Signed)
Discussed.

## 2013-06-12 NOTE — Progress Notes (Signed)
Pre visit review using our clinic review tool, if applicable. No additional management support is needed unless otherwise documented below in the visit note. 

## 2013-06-12 NOTE — Assessment & Plan Note (Signed)
See RX

## 2013-06-12 NOTE — Progress Notes (Signed)
   Subjective:    HPI    The patient is here to follow up on marital stress - she is separated now, depression, anxiety, headaches and chronic moderate fibromyalgia symptoms, not controlled now.  F/u on insomnia - better. Hives are better...    Wt Readings from Last 3 Encounters:  06/12/13 91 lb (41.277 kg)  01/09/13 97 lb (43.999 kg)  09/27/12 98 lb (44.453 kg)   BP Readings from Last 3 Encounters:  06/12/13 124/80  01/09/13 120/60  09/27/12 140/72      Review of Systems  Constitutional: Positive for fatigue. Negative for activity change, appetite change and unexpected weight change.  HENT: Negative for congestion, mouth sores and sinus pressure.   Eyes: Negative for visual disturbance.  Respiratory: Negative for chest tightness.   Gastrointestinal: Positive for nausea.  Genitourinary: Negative for frequency, decreased urine volume, difficulty urinating and vaginal pain.  Musculoskeletal: Negative for back pain and gait problem.  Skin: Negative for pallor.  Neurological: Negative for dizziness, tremors, weakness and numbness.  Hematological: Negative for adenopathy.  Psychiatric/Behavioral: Positive for sleep disturbance. Negative for suicidal ideas, hallucinations, confusion, self-injury, dysphoric mood, decreased concentration and agitation. The patient is nervous/anxious.        Better        Objective:   Physical Exam  Constitutional: She appears well-developed. No distress.  HENT:  Head: Normocephalic.  Right Ear: External ear normal.  Left Ear: External ear normal.  Nose: Nose normal.  Mouth/Throat: Oropharynx is clear and moist.  Eyes: Conjunctivae are normal. Pupils are equal, round, and reactive to light. Right eye exhibits no discharge. Left eye exhibits no discharge.  Neck: Normal range of motion. Neck supple. No JVD present. No tracheal deviation present. No thyromegaly present.  Cardiovascular: Normal rate, regular rhythm and normal heart sounds.    Pulmonary/Chest: No stridor. No respiratory distress. She has no wheezes.  Abdominal: Soft. Bowel sounds are normal. She exhibits no distension and no mass. There is no tenderness. There is no rebound and no guarding.  Musculoskeletal: She exhibits no edema and no tenderness.  Lymphadenopathy:    She has no cervical adenopathy.  Neurological: She displays normal reflexes. No cranial nerve deficit. She exhibits normal muscle tone. Coordination normal.  Skin: No rash noted. No erythema.  Psychiatric: Her behavior is normal. Judgment and thought content normal.  Anxious, sad  cane  Lab Results  Component Value Date   WBC 6.3 11/18/2011   HGB 13.8 11/18/2011   HCT 41.7 11/18/2011   PLT 258.0 11/18/2011   GLUCOSE 88 09/27/2012   CHOL 257* 09/26/2009   TRIG 73.0 09/26/2009   HDL 71.40 09/26/2009   LDLDIRECT 165.0 09/26/2009   ALT 15 09/27/2012   AST 17 09/27/2012   NA 140 09/27/2012   K 4.1 09/27/2012   CL 98 09/27/2012   CREATININE 0.6 09/27/2012   BUN 7 09/27/2012   CO2 31 09/27/2012   TSH 1.71 11/18/2011   INR 1.0 ratio 11/07/2009         Assessment & Plan:

## 2013-06-13 ENCOUNTER — Telehealth: Payer: Self-pay | Admitting: Internal Medicine

## 2013-06-13 NOTE — Telephone Encounter (Signed)
Relevant patient education assigned to patient using Emmi. ° °

## 2013-06-17 ENCOUNTER — Encounter: Payer: Self-pay | Admitting: Internal Medicine

## 2013-08-11 ENCOUNTER — Encounter: Payer: Self-pay | Admitting: Internal Medicine

## 2013-08-16 ENCOUNTER — Other Ambulatory Visit: Payer: Self-pay | Admitting: Internal Medicine

## 2013-11-07 ENCOUNTER — Ambulatory Visit: Payer: Medicare Other | Admitting: Internal Medicine

## 2013-11-13 ENCOUNTER — Telehealth: Payer: Self-pay

## 2013-11-13 ENCOUNTER — Encounter: Payer: Self-pay | Admitting: Internal Medicine

## 2013-11-13 ENCOUNTER — Other Ambulatory Visit (INDEPENDENT_AMBULATORY_CARE_PROVIDER_SITE_OTHER): Payer: Medicare Other

## 2013-11-13 ENCOUNTER — Ambulatory Visit (INDEPENDENT_AMBULATORY_CARE_PROVIDER_SITE_OTHER): Payer: Medicare Other | Admitting: Internal Medicine

## 2013-11-13 VITALS — BP 130/60 | HR 72 | Temp 98.4°F | Resp 16 | Wt 94.0 lb

## 2013-11-13 DIAGNOSIS — J449 Chronic obstructive pulmonary disease, unspecified: Secondary | ICD-10-CM

## 2013-11-13 DIAGNOSIS — M545 Low back pain, unspecified: Secondary | ICD-10-CM

## 2013-11-13 DIAGNOSIS — I10 Essential (primary) hypertension: Secondary | ICD-10-CM

## 2013-11-13 DIAGNOSIS — M3500 Sicca syndrome, unspecified: Secondary | ICD-10-CM

## 2013-11-13 DIAGNOSIS — E538 Deficiency of other specified B group vitamins: Secondary | ICD-10-CM

## 2013-11-13 DIAGNOSIS — J4489 Other specified chronic obstructive pulmonary disease: Secondary | ICD-10-CM

## 2013-11-13 DIAGNOSIS — K6289 Other specified diseases of anus and rectum: Secondary | ICD-10-CM | POA: Insufficient documentation

## 2013-11-13 DIAGNOSIS — Z23 Encounter for immunization: Secondary | ICD-10-CM

## 2013-11-13 LAB — CBC WITH DIFFERENTIAL/PLATELET
Basophils Absolute: 0 10*3/uL (ref 0.0–0.1)
Basophils Relative: 0.4 % (ref 0.0–3.0)
EOS ABS: 0.1 10*3/uL (ref 0.0–0.7)
EOS PCT: 0.6 % (ref 0.0–5.0)
HEMATOCRIT: 43.7 % (ref 36.0–46.0)
Hemoglobin: 14.5 g/dL (ref 12.0–15.0)
LYMPHS ABS: 1.6 10*3/uL (ref 0.7–4.0)
Lymphocytes Relative: 19.4 % (ref 12.0–46.0)
MCHC: 33.2 g/dL (ref 30.0–36.0)
MCV: 90.5 fl (ref 78.0–100.0)
Monocytes Absolute: 1.2 10*3/uL — ABNORMAL HIGH (ref 0.1–1.0)
Monocytes Relative: 13.8 % — ABNORMAL HIGH (ref 3.0–12.0)
Neutro Abs: 5.6 10*3/uL (ref 1.4–7.7)
Neutrophils Relative %: 65.8 % (ref 43.0–77.0)
PLATELETS: 324 10*3/uL (ref 150.0–400.0)
RBC: 4.83 Mil/uL (ref 3.87–5.11)
RDW: 14.1 % (ref 11.5–15.5)
WBC: 8.4 10*3/uL (ref 4.0–10.5)

## 2013-11-13 LAB — BASIC METABOLIC PANEL
BUN: 7 mg/dL (ref 6–23)
CHLORIDE: 103 meq/L (ref 96–112)
CO2: 31 mEq/L (ref 19–32)
Calcium: 9.3 mg/dL (ref 8.4–10.5)
Creatinine, Ser: 0.8 mg/dL (ref 0.4–1.2)
GFR: 77.72 mL/min (ref >60.00)
GLUCOSE: 98 mg/dL (ref 70–99)
Potassium: 4.2 mEq/L (ref 3.5–5.1)
Sodium: 142 mEq/L (ref 135–145)

## 2013-11-13 LAB — LIPID PANEL
Cholesterol: 231 mg/dL — ABNORMAL HIGH (ref 0–200)
HDL: 80.7 mg/dL (ref >39.00)
LDL CALC: 132 mg/dL — AB (ref 0–99)
NonHDL: 150.3
Total CHOL/HDL Ratio: 3
Triglycerides: 93 mg/dL (ref 0.0–149.0)
VLDL: 18.6 mg/dL (ref 0.0–40.0)

## 2013-11-13 LAB — HEPATIC FUNCTION PANEL
ALT: 15 U/L (ref 0–35)
AST: 18 U/L (ref 0–37)
Albumin: 4 g/dL (ref 3.5–5.2)
Alkaline Phosphatase: 81 U/L (ref 39–117)
BILIRUBIN TOTAL: 0.4 mg/dL (ref 0.2–1.2)
Bilirubin, Direct: 0 mg/dL (ref 0.0–0.3)
Total Protein: 7 g/dL (ref 6.0–8.3)

## 2013-11-13 LAB — URINALYSIS
Bilirubin Urine: NEGATIVE
Ketones, ur: NEGATIVE
Leukocytes, UA: NEGATIVE
Nitrite: NEGATIVE
PH: 5.5 (ref 5.0–8.0)
Specific Gravity, Urine: 1.025 (ref 1.000–1.030)
TOTAL PROTEIN, URINE-UPE24: NEGATIVE
Urine Glucose: 500 — AB
Urobilinogen, UA: 0.2 (ref 0.0–1.0)

## 2013-11-13 LAB — TSH: TSH: 1.01 u[IU]/mL (ref 0.35–4.50)

## 2013-11-13 LAB — VITAMIN B12: VITAMIN B 12: 791 pg/mL (ref 211–911)

## 2013-11-13 NOTE — Progress Notes (Signed)
Patient ID: Veronica [name], female   DOB: 07/12/1950, 63 y.o.   MRN: [id number]   Subjective:    HPI    The patient is here to follow up on marital stress - she is separated now, depression, anxiety, headaches and chronic moderate fibromyalgia symptoms, not controlled now.  F/u on insomnia - better. Hives are better...    Wt Readings from Last 3 Encounters:  11/13/13 94 lb (42.638 kg)  06/12/13 91 lb (41.277 kg)  01/09/13 97 lb (43.999 kg)   BP Readings from Last 3 Encounters:  11/13/13 130/60  06/12/13 124/80  01/09/13 120/60      Review of Systems  Constitutional: Positive for fatigue. Negative for activity change, appetite change and unexpected weight change.  HENT: Negative for congestion, mouth sores and sinus pressure.   Eyes: Negative for visual disturbance.  Respiratory: Negative for chest tightness.   Gastrointestinal: Positive for nausea.  Genitourinary: Negative for frequency, decreased urine volume, difficulty urinating and vaginal pain.  Musculoskeletal: Negative for back pain and gait problem.  Skin: Negative for pallor.  Neurological: Negative for dizziness, tremors, weakness and numbness.  Hematological: Negative for adenopathy.  Psychiatric/Behavioral: Positive for sleep disturbance. Negative for suicidal ideas, hallucinations, confusion, self-injury, dysphoric mood, decreased concentration and agitation. The patient is nervous/anxious.        Better        Objective:   Physical Exam  Constitutional: She appears well-developed. No distress.  HENT:  Head: Normocephalic.  Right Ear: External ear normal.  Left Ear: External ear normal.  Nose: Nose normal.  Mouth/Throat: Oropharynx is clear and moist.  Eyes: Conjunctivae are normal. Pupils are equal, round, and reactive to light. Right eye exhibits no discharge. Left eye exhibits no discharge.  Neck: Normal range of motion. Neck supple. No JVD present. No tracheal deviation present. No thyromegaly  present.  Cardiovascular: Normal rate, regular rhythm and normal heart sounds.   Pulmonary/Chest: No stridor. No respiratory distress. She has no wheezes.  Abdominal: Soft. Bowel sounds are normal. She exhibits no distension and no mass. There is no tenderness. There is no rebound and no guarding.  Musculoskeletal: She exhibits no edema and no tenderness.  Lymphadenopathy:    She has no cervical adenopathy.  Neurological: She displays normal reflexes. No cranial nerve deficit. She exhibits normal muscle tone. Coordination normal.  Skin: No rash noted. No erythema.  Psychiatric: Her behavior is normal. Judgment and thought content normal.  Anxious, sad  cane  Lab Results  Component Value Date   WBC 6.3 11/18/2011   HGB 13.8 11/18/2011   HCT 41.7 11/18/2011   PLT 258.0 11/18/2011   GLUCOSE 88 09/27/2012   CHOL 257* 09/26/2009   TRIG 73.0 09/26/2009   HDL 71.40 09/26/2009   LDLDIRECT 165.0 09/26/2009   ALT 15 09/27/2012   AST 17 09/27/2012   NA 140 09/27/2012   K 4.1 09/27/2012   CL 98 09/27/2012   CREATININE 0.6 09/27/2012   BUN 7 09/27/2012   CO2 31 09/27/2012   TSH 1.71 11/18/2011   INR 1.0 ratio 11/07/2009         Assessment & Plan:

## 2013-11-13 NOTE — Telephone Encounter (Signed)
AWV  Pt is able to do the AWV but wants it done with PCP.

## 2013-11-13 NOTE — Assessment & Plan Note (Signed)
Continue with current prescription therapy as reflected on the Med list.  

## 2013-11-13 NOTE — Assessment & Plan Note (Signed)
Stable

## 2013-11-13 NOTE — Assessment & Plan Note (Signed)
Chronic; h/o hemorrhoid Dr Olevia Perches She is due colonoscopy as per Dr Nichola Sizer colonoscopy note - will sch a consult

## 2013-11-13 NOTE — Telephone Encounter (Signed)
What is AWV? Thx

## 2013-11-13 NOTE — Progress Notes (Signed)
Pre visit review using our clinic review tool, if applicable. No additional management support is needed unless otherwise documented below in the visit note. 

## 2013-11-14 ENCOUNTER — Encounter: Payer: Self-pay | Admitting: Internal Medicine

## 2013-11-14 LAB — PHENYTOIN LEVEL, TOTAL: Phenytoin Lvl: 13.1 ug/mL (ref 10.0–20.0)

## 2013-11-22 ENCOUNTER — Encounter: Payer: Self-pay | Admitting: Internal Medicine

## 2013-12-11 ENCOUNTER — Other Ambulatory Visit: Payer: Self-pay | Admitting: Internal Medicine

## 2013-12-12 NOTE — Telephone Encounter (Signed)
#  120 until Dr Mamie Nick back

## 2014-01-03 ENCOUNTER — Ambulatory Visit (AMBULATORY_SURGERY_CENTER): Payer: Self-pay | Admitting: *Deleted

## 2014-01-03 VITALS — Ht 62.5 in | Wt 94.2 lb

## 2014-01-03 DIAGNOSIS — Z800 Family history of malignant neoplasm of digestive organs: Secondary | ICD-10-CM

## 2014-01-03 MED ORDER — MOVIPREP 100 G PO SOLR: ORAL | Status: DC

## 2014-01-03 NOTE — Progress Notes (Signed)
No allergies to eggs or soy. No problems with anesthesia.  Pt not given Emmi instructions for colonoscopy; no computer access  No oxygen use  No diet drug use  

## 2014-01-09 ENCOUNTER — Encounter: Payer: Self-pay | Admitting: Internal Medicine

## 2014-01-18 ENCOUNTER — Ambulatory Visit (AMBULATORY_SURGERY_CENTER): Payer: Medicare Other | Admitting: Internal Medicine

## 2014-01-18 ENCOUNTER — Encounter: Payer: Self-pay | Admitting: Internal Medicine

## 2014-01-18 VITALS — BP 137/72 | HR 61 | Temp 97.5°F | Resp 41 | Ht 62.0 in | Wt 94.0 lb

## 2014-01-18 DIAGNOSIS — D125 Benign neoplasm of sigmoid colon: Secondary | ICD-10-CM

## 2014-01-18 DIAGNOSIS — Z800 Family history of malignant neoplasm of digestive organs: Secondary | ICD-10-CM

## 2014-01-18 DIAGNOSIS — Z1211 Encounter for screening for malignant neoplasm of colon: Secondary | ICD-10-CM

## 2014-01-18 MED ORDER — SODIUM CHLORIDE 0.9 % IV SOLN: 500.0000 mL | INTRAVENOUS | Status: DC

## 2014-01-18 NOTE — Progress Notes (Signed)
Called to room to assist during endoscopic procedure.  Patient ID and intended procedure confirmed with present staff. Received instructions for my participation in the procedure from the performing physician.  

## 2014-01-18 NOTE — Progress Notes (Signed)
Patient awakening,vss,report to rn 

## 2014-01-18 NOTE — Patient Instructions (Addendum)

## 2014-01-18 NOTE — Op Note (Signed)
[facility]  [employer]. [place], [address]   COLONOSCOPY PROCEDURE REPORT  PATIENT: Veronica [name], Veronica [name]  MR#: [id number] BIRTHDATE: Aug 18, 1950 , 4  yrs. old GENDER: female ENDOSCOPIST: Lafayette Dragon, MD REFERRED ME:BRAX Avel Sensor, M.D. PROCEDURE DATE:  01/18/2014 PROCEDURE:   Colonoscopy with snare polypectomy First Screening Colonoscopy - Avg.  risk and is 50 yrs.  old or older - No.  Prior Negative Screening - Now for repeat screening. Less than 10 yrs Prior Negative Screening - Now for repeat screening.  Above average risk  History of Adenoma - Now for follow-up colonoscopy & has been > or = to 3 yrs.  N/A  Polyps Removed Today? Yes. ASA CLASS:   Class II INDICATIONS:sister died of colon cancer in her 42s.  Prior colonoscopy 2001 and December 2007.[name] MEDICATIONS: Monitored anesthesia care and Propofol 250 mg IV  DESCRIPTION OF PROCEDURE:   After the risks benefits and alternatives of the procedure were thoroughly explained, informed consent was obtained.  The digital rectal exam revealed no abnormalities of the rectum.   The LB [id number]-[id number]  endoscope was introduced through the anus and advanced to the cecum, which was identified by both the appendix and ileocecal valve. No adverse events experienced.   The quality of the prep was good, using MoviPrep  The instrument was then slowly withdrawn as the colon was fully examined.      COLON FINDINGS: A firm flat polyp measuring 6 mm in size was found in the sigmoid colon.  A polypectomy was performed with a cold snare.   There was mild diverticulosis noted in the sigmoid colon. Retroflexed views revealed no abnormalities. The time to cecum=7 minutes 43 seconds.  Withdrawal time=7 minutes 55 seconds.  The scope was withdrawn and the procedure completed. COMPLICATIONS: There were no immediate complications.  ENDOSCOPIC IMPRESSION: Flat polyp was found in the sigmoid colon; polypectomy was  performed with a cold snare mild diverticulosis of the sigmoid colon Small internal hemorrhoids  RECOMMENDATIONS: 1.  Await pathology results 2.  High fiber diet Recall colonoscopy in 5 years Preparation H OTCcream to use prn hemorrhoids  eSigned:  Lafayette Dragon, MD 01/18/2014 9:05 AM   cc:   PATIENT NAME:  [name], [name] MR#: [id number]

## 2014-01-21 ENCOUNTER — Telehealth: Payer: Self-pay

## 2014-01-21 NOTE — Telephone Encounter (Signed)
  Follow up Call-  Call back number 01/18/2014  Post procedure Call Back phone  # [phone]  Permission to leave phone message Yes     Patient questions:  Do you have a fever, pain , or abdominal swelling? No. Pain Score  0 *  Have you tolerated food without any problems? Yes.    Have you been able to return to your normal activities? Yes.    Do you have any questions about your discharge instructions: Diet   No. Medications  No. Follow up visit  No.  Do you have questions or concerns about your Care? No.  Actions: * If pain score is 4 or above: No action needed, pain <4.

## 2014-01-23 ENCOUNTER — Encounter: Payer: Self-pay | Admitting: Internal Medicine

## 2014-01-23 ENCOUNTER — Ambulatory Visit: Payer: Medicare Other | Admitting: Internal Medicine

## 2014-02-01 ENCOUNTER — Other Ambulatory Visit: Payer: Self-pay | Admitting: Internal Medicine

## 2014-02-11 ENCOUNTER — Encounter: Payer: Self-pay | Admitting: Internal Medicine

## 2014-02-11 ENCOUNTER — Ambulatory Visit (INDEPENDENT_AMBULATORY_CARE_PROVIDER_SITE_OTHER): Payer: Medicare Other | Admitting: Internal Medicine

## 2014-02-11 VITALS — BP 110/70 | HR 77 | Temp 98.6°F | Wt 89.0 lb

## 2014-02-11 DIAGNOSIS — J449 Chronic obstructive pulmonary disease, unspecified: Secondary | ICD-10-CM

## 2014-02-11 DIAGNOSIS — R634 Abnormal weight loss: Secondary | ICD-10-CM

## 2014-02-11 DIAGNOSIS — F419 Anxiety disorder, unspecified: Secondary | ICD-10-CM

## 2014-02-11 DIAGNOSIS — L500 Allergic urticaria: Secondary | ICD-10-CM

## 2014-02-11 DIAGNOSIS — L509 Urticaria, unspecified: Secondary | ICD-10-CM

## 2014-02-11 DIAGNOSIS — IMO0001 Reserved for inherently not codable concepts without codable children: Secondary | ICD-10-CM

## 2014-02-11 DIAGNOSIS — L508 Other urticaria: Secondary | ICD-10-CM

## 2014-02-11 DIAGNOSIS — R1013 Epigastric pain: Secondary | ICD-10-CM

## 2014-02-11 DIAGNOSIS — E559 Vitamin D deficiency, unspecified: Secondary | ICD-10-CM

## 2014-02-11 DIAGNOSIS — E538 Deficiency of other specified B group vitamins: Secondary | ICD-10-CM

## 2014-02-11 DIAGNOSIS — E875 Hyperkalemia: Secondary | ICD-10-CM

## 2014-02-11 DIAGNOSIS — Z23 Encounter for immunization: Secondary | ICD-10-CM

## 2014-02-11 DIAGNOSIS — Z0000 Encounter for general adult medical examination without abnormal findings: Secondary | ICD-10-CM | POA: Insufficient documentation

## 2014-02-11 MED ORDER — CLONAZEPAM 1 MG PO TABS: ORAL_TABLET | ORAL | Status: DC

## 2014-02-11 MED ORDER — CYANOCOBALAMIN 1000 MCG/ML IJ SOLN: 1000.0000 ug | INTRAMUSCULAR | Status: DC

## 2014-02-11 MED ORDER — "INSULIN SYRINGE-NEEDLE U-100 26G X 1/2"" 1 ML MISC": 1.0000 | Status: DC

## 2014-02-11 MED ORDER — CIPRODEX 0.3-0.1 % OT SUSP: 4.0000 [drp] | Freq: Two times a day (BID) | OTIC | Status: DC

## 2014-02-11 NOTE — Assessment & Plan Note (Signed)
Continue with current prescription therapy as reflected on the Med list.  

## 2014-02-11 NOTE — Assessment & Plan Note (Signed)
Potential benefits of a long term benzodiazepines  use as well as potential risks  and complications were explained to the patient and were aknowledged. Continue with current prn prescription therapy as reflected on the Med list.  

## 2014-02-11 NOTE — Assessment & Plan Note (Signed)
Here for medicare wellness/physical  Diet: heart healthy  Physical activity: sedentary  Depression/mood screen: negative  Hearing: intact to whispered voice  Visual acuity: grossly normal, performs annual eye exam  ADLs: capable  Fall risk: mild Home safety: good  Cognitive evaluation: intact to orientation, naming, recall and repetition  EOL planning: adv directives, full code/ I agree  I have personally reviewed and have noted  1. The patient's medical and social history  2. Their use of alcohol, tobacco or illicit drugs  3. Their current medications and supplements  4. The patient's functional ability including ADL's, fall risks, home safety risks and hearing or visual impairment.  5. Diet and physical activities  6. Evidence for depression or mood disorders    Today patient counseled on age appropriate routine health concerns for screening and prevention, each reviewed and up to date or declined. Immunizations reviewed and up to date or declined. Labs ordered and reviewed. Risk factors for depression reviewed and negative. Hearing function and visual acuity are intact. ADLs screened and addressed as needed. Functional ability and level of safety reviewed and appropriate. Education, counseling and referrals performed based on assessed risks today. Patient provided with a copy of personalized plan for preventive services.

## 2014-02-11 NOTE — Assessment & Plan Note (Signed)
Wt Readings from Last 3 Encounters:  02/11/14 89 lb (40.37 kg)  01/18/14 94 lb (42.638 kg)  01/03/14 94 lb 3.2 oz (42.729 kg)

## 2014-02-11 NOTE — Assessment & Plan Note (Signed)
Better now 

## 2014-02-11 NOTE — Patient Instructions (Signed)
Preventive Care for Adults A healthy lifestyle and preventive care can promote health and wellness. Preventive health guidelines for women include the following key practices.  A routine yearly physical is a good way to check with your health care provider about your health and preventive screening. It is a chance to share any concerns and updates on your health and to receive a thorough exam.  Visit your dentist for a routine exam and preventive care every 6 months. Brush your teeth twice a day and floss once a day. Good oral hygiene prevents tooth decay and gum disease.  The frequency of eye exams is based on your age, health, family medical history, use of contact lenses, and other factors. Follow your health care provider's recommendations for frequency of eye exams.  Eat a healthy diet. Foods like vegetables, fruits, whole grains, low-fat dairy products, and lean protein foods contain the nutrients you need without too many calories. Decrease your intake of foods high in solid fats, added sugars, and salt. Eat the right amount of calories for you.Get information about a proper diet from your health care provider, if necessary.  Regular physical exercise is one of the most important things you can do for your health. Most adults should get at least 150 minutes of moderate-intensity exercise (any activity that increases your heart rate and causes you to sweat) each week. In addition, most adults need muscle-strengthening exercises on 2 or more days a week.  Maintain a healthy weight. The body mass index (BMI) is a screening tool to identify possible weight problems. It provides an estimate of body fat based on height and weight. Your health care provider can find your BMI and can help you achieve or maintain a healthy weight.For adults 20 years and older:  A BMI below 18.5 is considered underweight.  A BMI of 18.5 to 24.9 is normal.  A BMI of 25 to 29.9 is considered overweight.  A BMI of  30 and above is considered obese.  Maintain normal blood lipids and cholesterol levels by exercising and minimizing your intake of saturated fat. Eat a balanced diet with plenty of fruit and vegetables. Blood tests for lipids and cholesterol should begin at age 76 and be repeated every 5 years. If your lipid or cholesterol levels are high, you are over 50, or you are at high risk for heart disease, you may need your cholesterol levels checked more frequently.Ongoing high lipid and cholesterol levels should be treated with medicines if diet and exercise are not working.  If you smoke, find out from your health care provider how to quit. If you do not use tobacco, do not start.  Lung cancer screening is recommended for adults aged 22-80 years who are at high risk for developing lung cancer because of a history of smoking. A yearly low-dose CT scan of the lungs is recommended for people who have at least a 30-pack-year history of smoking and are a current smoker or have quit within the past 15 years. A pack year of smoking is smoking an average of 1 pack of cigarettes a day for 1 year (for example: 1 pack a day for 30 years or 2 packs a day for 15 years). Yearly screening should continue until the smoker has stopped smoking for at least 15 years. Yearly screening should be stopped for people who develop a health problem that would prevent them from having lung cancer treatment.  If you are pregnant, do not drink alcohol. If you are breastfeeding,  be very cautious about drinking alcohol. If you are not pregnant and choose to drink alcohol, do not have more than 1 drink per day. One drink is considered to be 12 ounces (355 mL) of beer, 5 ounces (148 mL) of wine, or 1.5 ounces (44 mL) of liquor.  Avoid use of street drugs. Do not share needles with anyone. Ask for help if you need support or instructions about stopping the use of drugs.  High blood pressure causes heart disease and increases the risk of  stroke. Your blood pressure should be checked at least every 1 to 2 years. Ongoing high blood pressure should be treated with medicines if weight loss and exercise do not work.  If you are 71-82 years old, ask your health care provider if you should take aspirin to prevent strokes.  Diabetes screening involves taking a blood sample to check your fasting blood sugar level. This should be done once every 3 years, after age 37, if you are within normal weight and without risk factors for diabetes. Testing should be considered at a younger age or be carried out more frequently if you are overweight and have at least 1 risk factor for diabetes.  Breast cancer screening is essential preventive care for women. You should practice "breast self-awareness." This means understanding the normal appearance and feel of your breasts and may include breast self-examination. Any changes detected, no matter how small, should be reported to a health care provider. Women in their 75s and 30s should have a clinical breast exam (CBE) by a health care provider as part of a regular health exam every 1 to 3 years. After age 95, women should have a CBE every year. Starting at age 31, women should consider having a mammogram (breast X-ray test) every year. Women who have a family history of breast cancer should talk to their health care provider about genetic screening. Women at a high risk of breast cancer should talk to their health care providers about having an MRI and a mammogram every year.  Breast cancer gene (BRCA)-related cancer risk assessment is recommended for women who have family members with BRCA-related cancers. BRCA-related cancers include breast, ovarian, tubal, and peritoneal cancers. Having family members with these cancers may be associated with an increased risk for harmful changes (mutations) in the breast cancer genes BRCA1 and BRCA2. Results of the assessment will determine the need for genetic counseling and  BRCA1 and BRCA2 testing.  Routine pelvic exams to screen for cancer are no longer recommended for nonpregnant women who are considered low risk for cancer of the pelvic organs (ovaries, uterus, and vagina) and who do not have symptoms. Ask your health care provider if a screening pelvic exam is right for you.  If you have had past treatment for cervical cancer or a condition that could lead to cancer, you need Pap tests and screening for cancer for at least 20 years after your treatment. If Pap tests have been discontinued, your risk factors (such as having a new sexual partner) need to be reassessed to determine if screening should be resumed. Some women have medical problems that increase the chance of getting cervical cancer. In these cases, your health care provider may recommend more frequent screening and Pap tests.  The HPV test is an additional test that may be used for cervical cancer screening. The HPV test looks for the virus that can cause the cell changes on the cervix. The cells collected during the Pap test can be  tested for HPV. The HPV test could be used to screen women aged 30 years and older, and should be used in women of any age who have unclear Pap test results. After the age of 30, women should have HPV testing at the same frequency as a Pap test.  Colorectal cancer can be detected and often prevented. Most routine colorectal cancer screening begins at the age of 50 years and continues through age 75 years. However, your health care provider may recommend screening at an earlier age if you have risk factors for colon cancer. On a yearly basis, your health care provider may provide home test kits to check for hidden blood in the stool. Use of a small camera at the end of a tube, to directly examine the colon (sigmoidoscopy or colonoscopy), can detect the earliest forms of colorectal cancer. Talk to your health care provider about this at age 50, when routine screening begins. Direct  exam of the colon should be repeated every 5-10 years through age 75 years, unless early forms of pre-cancerous polyps or small growths are found.  People who are at an increased risk for hepatitis B should be screened for this virus. You are considered at high risk for hepatitis B if:  You were born in a country where hepatitis B occurs often. Talk with your health care provider about which countries are considered high risk.  Your parents were born in a high-risk country and you have not received a shot to protect against hepatitis B (hepatitis B vaccine).  You have HIV or AIDS.  You use needles to inject street drugs.  You live with, or have sex with, someone who has hepatitis B.  You get hemodialysis treatment.  You take certain medicines for conditions like cancer, organ transplantation, and autoimmune conditions.  Hepatitis C blood testing is recommended for all people born from 1945 through 1965 and any individual with known risks for hepatitis C.  Practice safe sex. Use condoms and avoid high-risk sexual practices to reduce the spread of sexually transmitted infections (STIs). STIs include gonorrhea, chlamydia, syphilis, trichomonas, herpes, HPV, and human immunodeficiency virus (HIV). Herpes, HIV, and HPV are viral illnesses that have no cure. They can result in disability, cancer, and death.  You should be screened for sexually transmitted illnesses (STIs) including gonorrhea and chlamydia if:  You are sexually active and are younger than 24 years.  You are older than 24 years and your health care provider tells you that you are at risk for this type of infection.  Your sexual activity has changed since you were last screened and you are at an increased risk for chlamydia or gonorrhea. Ask your health care provider if you are at risk.  If you are at risk of being infected with HIV, it is recommended that you take a prescription medicine daily to prevent HIV infection. This is  called preexposure prophylaxis (PrEP). You are considered at risk if:  You are a heterosexual woman, are sexually active, and are at increased risk for HIV infection.  You take drugs by injection.  You are sexually active with a partner who has HIV.  Talk with your health care provider about whether you are at high risk of being infected with HIV. If you choose to begin PrEP, you should first be tested for HIV. You should then be tested every 3 months for as long as you are taking PrEP.  Osteoporosis is a disease in which the bones lose minerals and strength   with aging. This can result in serious bone fractures or breaks. The risk of osteoporosis can be identified using a bone density scan. Women ages 65 years and over and women at risk for fractures or osteoporosis should discuss screening with their health care providers. Ask your health care provider whether you should take a calcium supplement or vitamin D to reduce the rate of osteoporosis.  Menopause can be associated with physical symptoms and risks. Hormone replacement therapy is available to decrease symptoms and risks. You should talk to your health care provider about whether hormone replacement therapy is right for you.  Use sunscreen. Apply sunscreen liberally and repeatedly throughout the day. You should seek shade when your shadow is shorter than you. Protect yourself by wearing long sleeves, pants, a wide-brimmed hat, and sunglasses year round, whenever you are outdoors.  Once a month, do a whole body skin exam, using a mirror to look at the skin on your back. Tell your health care provider of new moles, moles that have irregular borders, moles that are larger than a pencil eraser, or moles that have changed in shape or color.  Stay current with required vaccines (immunizations).  Influenza vaccine. All adults should be immunized every year.  Tetanus, diphtheria, and acellular pertussis (Td, Tdap) vaccine. Pregnant women should  receive 1 dose of Tdap vaccine during each pregnancy. The dose should be obtained regardless of the length of time since the last dose. Immunization is preferred during the 27th-36th week of gestation. An adult who has not previously received Tdap or who does not know her vaccine status should receive 1 dose of Tdap. This initial dose should be followed by tetanus and diphtheria toxoids (Td) booster doses every 10 years. Adults with an unknown or incomplete history of completing a 3-dose immunization series with Td-containing vaccines should begin or complete a primary immunization series including a Tdap dose. Adults should receive a Td booster every 10 years.  Varicella vaccine. An adult without evidence of immunity to varicella should receive 2 doses or a second dose if she has previously received 1 dose. Pregnant females who do not have evidence of immunity should receive the first dose after pregnancy. This first dose should be obtained before leaving the health care facility. The second dose should be obtained 4-8 weeks after the first dose.  Human papillomavirus (HPV) vaccine. Females aged 13-26 years who have not received the vaccine previously should obtain the 3-dose series. The vaccine is not recommended for use in pregnant females. However, pregnancy testing is not needed before receiving a dose. If a female is found to be pregnant after receiving a dose, no treatment is needed. In that case, the remaining doses should be delayed until after the pregnancy. Immunization is recommended for any person with an immunocompromised condition through the age of 26 years if she did not get any or all doses earlier. During the 3-dose series, the second dose should be obtained 4-8 weeks after the first dose. The third dose should be obtained 24 weeks after the first dose and 16 weeks after the second dose.  Zoster vaccine. One dose is recommended for adults aged 60 years or older unless certain conditions are  present.  Measles, mumps, and rubella (MMR) vaccine. Adults born before 1957 generally are considered immune to measles and mumps. Adults born in 1957 or later should have 1 or more doses of MMR vaccine unless there is a contraindication to the vaccine or there is laboratory evidence of immunity to   each of the three diseases. A routine second dose of MMR vaccine should be obtained at least 28 days after the first dose for students attending postsecondary schools, health care workers, or international travelers. People who received inactivated measles vaccine or an unknown type of measles vaccine during 1963-1967 should receive 2 doses of MMR vaccine. People who received inactivated mumps vaccine or an unknown type of mumps vaccine before 1979 and are at high risk for mumps infection should consider immunization with 2 doses of MMR vaccine. For females of childbearing age, rubella immunity should be determined. If there is no evidence of immunity, females who are not pregnant should be vaccinated. If there is no evidence of immunity, females who are pregnant should delay immunization until after pregnancy. Unvaccinated health care workers born before 1957 who lack laboratory evidence of measles, mumps, or rubella immunity or laboratory confirmation of disease should consider measles and mumps immunization with 2 doses of MMR vaccine or rubella immunization with 1 dose of MMR vaccine.  Pneumococcal 13-valent conjugate (PCV13) vaccine. When indicated, a person who is uncertain of her immunization history and has no record of immunization should receive the PCV13 vaccine. An adult aged 19 years or older who has certain medical conditions and has not been previously immunized should receive 1 dose of PCV13 vaccine. This PCV13 should be followed with a dose of pneumococcal polysaccharide (PPSV23) vaccine. The PPSV23 vaccine dose should be obtained at least 8 weeks after the dose of PCV13 vaccine. An adult aged 19  years or older who has certain medical conditions and previously received 1 or more doses of PPSV23 vaccine should receive 1 dose of PCV13. The PCV13 vaccine dose should be obtained 1 or more years after the last PPSV23 vaccine dose.  Pneumococcal polysaccharide (PPSV23) vaccine. When PCV13 is also indicated, PCV13 should be obtained first. All adults aged 65 years and older should be immunized. An adult younger than age 65 years who has certain medical conditions should be immunized. Any person who resides in a nursing home or long-term care facility should be immunized. An adult smoker should be immunized. People with an immunocompromised condition and certain other conditions should receive both PCV13 and PPSV23 vaccines. People with human immunodeficiency virus (HIV) infection should be immunized as soon as possible after diagnosis. Immunization during chemotherapy or radiation therapy should be avoided. Routine use of PPSV23 vaccine is not recommended for American Indians, [place] Natives, or people younger than 65 years unless there are medical conditions that require PPSV23 vaccine. When indicated, people who have unknown immunization and have no record of immunization should receive PPSV23 vaccine. One-time revaccination 5 years after the first dose of PPSV23 is recommended for people aged 19-64 years who have chronic kidney failure, nephrotic syndrome, asplenia, or immunocompromised conditions. People who received 1-2 doses of PPSV23 before age 65 years should receive another dose of PPSV23 vaccine at age 65 years or later if at least 5 years have passed since the previous dose. Doses of PPSV23 are not needed for people immunized with PPSV23 at or after age 65 years.  Meningococcal vaccine. Adults with asplenia or persistent complement component deficiencies should receive 2 doses of quadrivalent meningococcal conjugate (MenACWY-D) vaccine. The doses should be obtained at least 2 months apart.  Microbiologists working with certain meningococcal bacteria, military recruits, people at risk during an outbreak, and people who travel to or live in countries with a high rate of meningitis should be immunized. A first-year college student up through age   21 years who is living in a residence hall should receive a dose if she did not receive a dose on or after her 16th birthday. Adults who have certain high-risk conditions should receive one or more doses of vaccine.  Hepatitis A vaccine. Adults who wish to be protected from this disease, have certain high-risk conditions, work with hepatitis A-infected animals, work in hepatitis A research labs, or travel to or work in countries with a high rate of hepatitis A should be immunized. Adults who were previously unvaccinated and who anticipate close contact with an international adoptee during the first 60 days after arrival in the [place] from a country with a high rate of hepatitis A should be immunized.  Hepatitis B vaccine. Adults who wish to be protected from this disease, have certain high-risk conditions, may be exposed to blood or other infectious body fluids, are household contacts or sex partners of hepatitis B positive people, are clients or workers in certain care facilities, or travel to or work in countries with a high rate of hepatitis B should be immunized.  Haemophilus influenzae type b (Hib) vaccine. A previously unvaccinated person with asplenia or sickle cell disease or having a scheduled splenectomy should receive 1 dose of Hib vaccine. Regardless of previous immunization, a recipient of a hematopoietic stem cell transplant should receive a 3-dose series 6-12 months after her successful transplant. Hib vaccine is not recommended for adults with HIV infection. Preventive Services / Frequency Ages 64 to 68 years  Blood pressure check.** / Every 1 to 2 years.  Lipid and cholesterol check.** / Every 5 years beginning at age  22.  Clinical breast exam.** / Every 3 years for women in their 88s and 53s.  BRCA-related cancer risk assessment.** / For women who have family members with a BRCA-related cancer (breast, ovarian, tubal, or peritoneal cancers).  Pap test.** / Every 2 years from ages 90 through 51. Every 3 years starting at age 21 through age 56 or 3 with a history of 3 consecutive normal Pap tests.  HPV screening.** / Every 3 years from ages 24 through ages 1 to 46 with a history of 3 consecutive normal Pap tests.  Hepatitis C blood test.** / For any individual with known risks for hepatitis C.  Skin self-exam. / Monthly.  Influenza vaccine. / Every year.  Tetanus, diphtheria, and acellular pertussis (Tdap, Td) vaccine.** / Consult your health care provider. Pregnant women should receive 1 dose of Tdap vaccine during each pregnancy. 1 dose of Td every 10 years.  Varicella vaccine.** / Consult your health care provider. Pregnant females who do not have evidence of immunity should receive the first dose after pregnancy.  HPV vaccine. / 3 doses over 6 months, if 72 and younger. The vaccine is not recommended for use in pregnant females. However, pregnancy testing is not needed before receiving a dose.  Measles, mumps, rubella (MMR) vaccine.** / You need at least 1 dose of MMR if you were born in 1957 or later. You may also need a 2nd dose. For females of childbearing age, rubella immunity should be determined. If there is no evidence of immunity, females who are not pregnant should be vaccinated. If there is no evidence of immunity, females who are pregnant should delay immunization until after pregnancy.  Pneumococcal 13-valent conjugate (PCV13) vaccine.** / Consult your health care provider.  Pneumococcal polysaccharide (PPSV23) vaccine.** / 1 to 2 doses if you smoke cigarettes or if you have certain conditions.  Meningococcal vaccine.** /  1 dose if you are age 39 to 3 years and a [place] living in a residence hall, or have one of several medical conditions, you need to get vaccinated against meningococcal disease. You may also need additional booster doses.  Hepatitis A vaccine.** / Consult your health care provider.  Hepatitis B vaccine.** / Consult your health care provider.  Haemophilus influenzae type b (Hib) vaccine.** / Consult your health care provider. Ages 64 to 55 years  Blood pressure check.** / Every 1 to 2 years.  Lipid and cholesterol check.** / Every 5 years beginning at age 12 years.  Lung cancer screening. / Every year if you are aged 55-80 years and have a 30-pack-year history of smoking and currently smoke or have quit within the past 15 years. Yearly screening is stopped once you have quit smoking for at least 15 years or develop a health problem that would prevent you from having lung cancer treatment.  Clinical breast exam.** / Every year after age 3 years.  BRCA-related cancer risk assessment.** / For women who have family members with a BRCA-related cancer (breast, ovarian, tubal, or peritoneal cancers).  Mammogram.** / Every year beginning at age 45 years and continuing for as long as you are in good health. Consult with your health care provider.  Pap test.** / Every 3 years starting at age 72 years through age 70 or 27 years with a history of 3 consecutive normal Pap tests.  HPV screening.** / Every 3 years from ages 54 years through ages 82 to 81 years with a history of 3 consecutive normal Pap tests.  Fecal occult blood test (FOBT) of stool. / Every year beginning at age 48 years and continuing until age 38 years. You may not need to do this test if you get a colonoscopy every 10 years.  Flexible sigmoidoscopy or colonoscopy.** / Every 5 years for a flexible sigmoidoscopy or every 10 years for a colonoscopy beginning at age 88 years and continuing until age 68 years.  Hepatitis C blood test.** / For all people born from [birth year] through  1966 and any individual with known risks for hepatitis C.  Skin self-exam. / Monthly.  Influenza vaccine. / Every year.  Tetanus, diphtheria, and acellular pertussis (Tdap/Td) vaccine.** / Consult your health care provider. Pregnant women should receive 1 dose of Tdap vaccine during each pregnancy. 1 dose of Td every 10 years.  Varicella vaccine.** / Consult your health care provider. Pregnant females who do not have evidence of immunity should receive the first dose after pregnancy.  Zoster vaccine.** / 1 dose for adults aged 67 years or older.  Measles, mumps, rubella (MMR) vaccine.** / You need at least 1 dose of MMR if you were born in 1957 or later. You may also need a 2nd dose. For females of childbearing age, rubella immunity should be determined. If there is no evidence of immunity, females who are not pregnant should be vaccinated. If there is no evidence of immunity, females who are pregnant should delay immunization until after pregnancy.  Pneumococcal 13-valent conjugate (PCV13) vaccine.** / Consult your health care provider.  Pneumococcal polysaccharide (PPSV23) vaccine.** / 1 to 2 doses if you smoke cigarettes or if you have certain conditions.  Meningococcal vaccine.** / Consult your health care provider.  Hepatitis A vaccine.** / Consult your health care provider.  Hepatitis B vaccine.** / Consult your health care provider.  Haemophilus influenzae type b (Hib) vaccine.** / Consult your health care provider. Ages 64  years and over  Blood pressure check.** / Every 1 to 2 years.  Lipid and cholesterol check.** / Every 5 years beginning at age 22 years.  Lung cancer screening. / Every year if you are aged 73-80 years and have a 30-pack-year history of smoking and currently smoke or have quit within the past 15 years. Yearly screening is stopped once you have quit smoking for at least 15 years or develop a health problem that would prevent you from having lung cancer  treatment.  Clinical breast exam.** / Every year after age 4 years.  BRCA-related cancer risk assessment.** / For women who have family members with a BRCA-related cancer (breast, ovarian, tubal, or peritoneal cancers).  Mammogram.** / Every year beginning at age 40 years and continuing for as long as you are in good health. Consult with your health care provider.  Pap test.** / Every 3 years starting at age 9 years through age 34 or 91 years with 3 consecutive normal Pap tests. Testing can be stopped between 65 and 70 years with 3 consecutive normal Pap tests and no abnormal Pap or HPV tests in the past 10 years.  HPV screening.** / Every 3 years from ages 57 years through ages 64 or 45 years with a history of 3 consecutive normal Pap tests. Testing can be stopped between 65 and 70 years with 3 consecutive normal Pap tests and no abnormal Pap or HPV tests in the past 10 years.  Fecal occult blood test (FOBT) of stool. / Every year beginning at age 15 years and continuing until age 17 years. You may not need to do this test if you get a colonoscopy every 10 years.  Flexible sigmoidoscopy or colonoscopy.** / Every 5 years for a flexible sigmoidoscopy or every 10 years for a colonoscopy beginning at age 86 years and continuing until age 71 years.  Hepatitis C blood test.** / For all people born from [birth year] through 1966 and any individual with known risks for hepatitis C.  Osteoporosis screening.** / A one-time screening for women ages 83 years and over and women at risk for fractures or osteoporosis.  Skin self-exam. / Monthly.  Influenza vaccine. / Every year.  Tetanus, diphtheria, and acellular pertussis (Tdap/Td) vaccine.** / 1 dose of Td every 10 years.  Varicella vaccine.** / Consult your health care provider.  Zoster vaccine.** / 1 dose for adults aged 61 years or older.  Pneumococcal 13-valent conjugate (PCV13) vaccine.** / Consult your health care provider.  Pneumococcal  polysaccharide (PPSV23) vaccine.** / 1 dose for all adults aged 28 years and older.  Meningococcal vaccine.** / Consult your health care provider.  Hepatitis A vaccine.** / Consult your health care provider.  Hepatitis B vaccine.** / Consult your health care provider.  Haemophilus influenzae type b (Hib) vaccine.** / Consult your health care provider. ** Family history and personal history of risk and conditions may change your health care provider's recommendations. Document Released: 04/27/2001 Document Revised: 07/16/2013 Document Reviewed: 07/27/2010 [facility] Patient Information 2015 [place], [place]. This information is not intended to replace advice given to you by your health care provider. Make sure you discuss any questions you have with your health care provider.

## 2014-02-11 NOTE — Progress Notes (Signed)
   Subjective:    HPI  The patient is here for a wellness exam. C/o recurrent OE The patient is here to follow up on marital stress - she is separated now, depression, anxiety, headaches and chronic moderate fibromyalgia symptoms, not controlled now.  F/u on insomnia - better. Hives are better...    Wt Readings from Last 3 Encounters:  02/11/14 89 lb (40.37 kg)  01/18/14 94 lb (42.638 kg)  01/03/14 94 lb 3.2 oz (42.729 kg)   BP Readings from Last 3 Encounters:  02/11/14 110/70  01/18/14 137/72  11/13/13 130/60      Review of Systems  Constitutional: Positive for fatigue. Negative for activity change, appetite change and unexpected weight change.  HENT: Negative for congestion, mouth sores and sinus pressure.   Eyes: Negative for visual disturbance.  Respiratory: Negative for chest tightness.   Gastrointestinal: Positive for nausea.  Genitourinary: Negative for frequency, decreased urine volume, difficulty urinating and vaginal pain.  Musculoskeletal: Negative for back pain and gait problem.  Skin: Negative for pallor.  Neurological: Negative for dizziness, tremors, weakness and numbness.  Hematological: Negative for adenopathy.  Psychiatric/Behavioral: Positive for sleep disturbance. Negative for suicidal ideas, hallucinations, confusion, self-injury, dysphoric mood, decreased concentration and agitation. The patient is nervous/anxious.        Better        Objective:   Physical Exam  Constitutional: She appears well-developed. No distress.  HENT:  Head: Normocephalic.  Right Ear: External ear normal.  Left Ear: External ear normal.  Nose: Nose normal.  Mouth/Throat: Oropharynx is clear and moist.  Eyes: Conjunctivae are normal. Pupils are equal, round, and reactive to light. Right eye exhibits no discharge. Left eye exhibits no discharge.  Neck: Normal range of motion. Neck supple. No JVD present. No tracheal deviation present. No thyromegaly present.   Cardiovascular: Normal rate, regular rhythm and normal heart sounds.   Pulmonary/Chest: No stridor. No respiratory distress. She has no wheezes.  Abdominal: Soft. Bowel sounds are normal. She exhibits no distension and no mass. There is no tenderness. There is no rebound and no guarding.  Musculoskeletal: She exhibits no edema or tenderness.  Lymphadenopathy:    She has no cervical adenopathy.  Neurological: She displays normal reflexes. No cranial nerve deficit. She exhibits normal muscle tone. Coordination abnormal.  Skin: No rash noted. No erythema.  Psychiatric: She has a normal mood and affect. Her behavior is normal. Judgment and thought content normal.  Using a cane  Lab Results  Component Value Date   WBC 8.4 11/13/2013   HGB 14.5 11/13/2013   HCT 43.7 11/13/2013   PLT 324.0 11/13/2013   GLUCOSE 98 11/13/2013   CHOL 231* 11/13/2013   TRIG 93.0 11/13/2013   HDL 80.70 11/13/2013   LDLDIRECT 165.0 09/26/2009   LDLCALC 132* 11/13/2013   ALT 15 11/13/2013   AST 18 11/13/2013   NA 142 11/13/2013   K 4.2 11/13/2013   CL 103 11/13/2013   CREATININE 0.8 11/13/2013   BUN 7 11/13/2013   CO2 31 11/13/2013   TSH 1.01 11/13/2013   INR 1.0 ratio 11/07/2009         Assessment & Plan:

## 2014-02-11 NOTE — Progress Notes (Signed)
Pre visit review using our clinic review tool, if applicable. No additional management support is needed unless otherwise documented below in the visit note. 

## 2014-02-11 NOTE — Assessment & Plan Note (Signed)
Omeprazole, Carafate were stopped by Dr Olevia Perches

## 2014-02-23 ENCOUNTER — Other Ambulatory Visit: Payer: Self-pay | Admitting: Internal Medicine

## 2014-02-26 ENCOUNTER — Other Ambulatory Visit: Payer: Self-pay | Admitting: Internal Medicine

## 2014-03-04 NOTE — Telephone Encounter (Signed)
called pharmacy had to leave refill info on the pharmacist voicemail...Veronica [name]

## 2014-07-10 ENCOUNTER — Encounter: Payer: Self-pay | Admitting: Internal Medicine

## 2014-07-10 ENCOUNTER — Ambulatory Visit (INDEPENDENT_AMBULATORY_CARE_PROVIDER_SITE_OTHER): Payer: Medicare Other | Admitting: Internal Medicine

## 2014-07-10 VITALS — BP 130/74 | HR 77 | Wt 97.0 lb

## 2014-07-10 DIAGNOSIS — R634 Abnormal weight loss: Secondary | ICD-10-CM

## 2014-07-10 DIAGNOSIS — I10 Essential (primary) hypertension: Secondary | ICD-10-CM

## 2014-07-10 DIAGNOSIS — F419 Anxiety disorder, unspecified: Secondary | ICD-10-CM

## 2014-07-10 DIAGNOSIS — G4700 Insomnia, unspecified: Secondary | ICD-10-CM

## 2014-07-10 DIAGNOSIS — J449 Chronic obstructive pulmonary disease, unspecified: Secondary | ICD-10-CM

## 2014-07-10 DIAGNOSIS — E559 Vitamin D deficiency, unspecified: Secondary | ICD-10-CM

## 2014-07-10 DIAGNOSIS — F329 Major depressive disorder, single episode, unspecified: Secondary | ICD-10-CM

## 2014-07-10 DIAGNOSIS — F32A Depression, unspecified: Secondary | ICD-10-CM

## 2014-07-10 DIAGNOSIS — E875 Hyperkalemia: Secondary | ICD-10-CM

## 2014-07-10 DIAGNOSIS — J4540 Moderate persistent asthma, uncomplicated: Secondary | ICD-10-CM

## 2014-07-10 DIAGNOSIS — L509 Urticaria, unspecified: Secondary | ICD-10-CM | POA: Diagnosis not present

## 2014-07-10 DIAGNOSIS — IMO0001 Reserved for inherently not codable concepts without codable children: Secondary | ICD-10-CM

## 2014-07-10 DIAGNOSIS — E538 Deficiency of other specified B group vitamins: Secondary | ICD-10-CM

## 2014-07-10 MED ORDER — IPRATROPIUM-ALBUTEROL 20-100 MCG/ACT IN AERS
1.0000 | INHALATION_SPRAY | Freq: Four times a day (QID) | RESPIRATORY_TRACT | Status: DC | PRN
Start: 2014-07-10 — End: 2014-12-06

## 2014-07-10 MED ORDER — CYANOCOBALAMIN 1000 MCG/ML IJ SOLN: 1000.0000 ug | INTRAMUSCULAR | Status: DC

## 2014-07-10 MED ORDER — SACCHAROMYCES BOULARDII 250 MG PO CAPS: 250.0000 mg | ORAL_CAPSULE | Freq: Two times a day (BID) | ORAL | Status: DC

## 2014-07-10 MED ORDER — COMBIVENT 18-103 MCG/ACT IN AERO: 2.0000 | INHALATION_SPRAY | RESPIRATORY_TRACT | Status: DC | PRN

## 2014-07-10 MED ORDER — PROMETHAZINE-CODEINE 6.25-10 MG/5ML PO SYRP: 5.0000 mL | ORAL_SOLUTION | Freq: Four times a day (QID) | ORAL | Status: DC | PRN

## 2014-07-10 NOTE — Assessment & Plan Note (Signed)
Chronic  Calan SR

## 2014-07-10 NOTE — Assessment & Plan Note (Signed)
Not on med rx: stable

## 2014-07-10 NOTE — Assessment & Plan Note (Signed)
Chronic Pulmicort, Combivent Prom-cod syr 

## 2014-07-10 NOTE — Progress Notes (Signed)
   Subjective:    HPI   C/o recurrent tooth infection since winter, took levaquin x7d three times  The patient is here to follow up on marital stress - she is separated now, depression, anxiety, headaches and chronic moderate fibromyalgia symptoms, not controlled now.  F/u on insomnia - better. Hives are better...    Wt Readings from Last 3 Encounters:  07/10/14 97 lb (43.999 kg)  02/11/14 89 lb (40.37 kg)  01/18/14 94 lb (42.638 kg)   BP Readings from Last 3 Encounters:  07/10/14 130/74  02/11/14 110/70  01/18/14 137/72      Review of Systems  Constitutional: Positive for fatigue. Negative for activity change, appetite change and unexpected weight change.  HENT: Negative for congestion, mouth sores and sinus pressure.   Eyes: Negative for visual disturbance.  Respiratory: Negative for chest tightness.   Gastrointestinal: Positive for nausea.  Genitourinary: Negative for frequency, decreased urine volume, difficulty urinating and vaginal pain.  Musculoskeletal: Negative for back pain and gait problem.  Skin: Negative for pallor.  Neurological: Negative for dizziness, tremors, weakness and numbness.  Hematological: Negative for adenopathy.  Psychiatric/Behavioral: Positive for sleep disturbance. Negative for suicidal ideas, hallucinations, confusion, self-injury, dysphoric mood, decreased concentration and agitation. The patient is nervous/anxious.        Better        Objective:   Physical Exam  Constitutional: She appears well-developed. No distress.  HENT:  Head: Normocephalic.  Right Ear: External ear normal.  Left Ear: External ear normal.  Nose: Nose normal.  Mouth/Throat: Oropharynx is clear and moist.  Eyes: Conjunctivae are normal. Pupils are equal, round, and reactive to light. Right eye exhibits no discharge. Left eye exhibits no discharge.  Neck: Normal range of motion. Neck supple. No JVD present. No tracheal deviation present. No thyromegaly present.   Cardiovascular: Normal rate, regular rhythm and normal heart sounds.   Pulmonary/Chest: No stridor. No respiratory distress. She has no wheezes.  Abdominal: Soft. Bowel sounds are normal. She exhibits no distension and no mass. There is no tenderness. There is no rebound and no guarding.  Musculoskeletal: She exhibits no edema or tenderness.  Lymphadenopathy:    She has no cervical adenopathy.  Neurological: She displays normal reflexes. No cranial nerve deficit. She exhibits normal muscle tone. Coordination abnormal.  Skin: No rash noted. No erythema.  Psychiatric: She has a normal mood and affect. Her behavior is normal. Judgment and thought content normal.  Using a cane B ankle braces  Lab Results  Component Value Date   WBC 8.4 11/13/2013   HGB 14.5 11/13/2013   HCT 43.7 11/13/2013   PLT 324.0 11/13/2013   GLUCOSE 98 11/13/2013   CHOL 231* 11/13/2013   TRIG 93.0 11/13/2013   HDL 80.70 11/13/2013   LDLDIRECT 165.0 09/26/2009   LDLCALC 132* 11/13/2013   ALT 15 11/13/2013   AST 18 11/13/2013   NA 142 11/13/2013   K 4.2 11/13/2013   CL 103 11/13/2013   CREATININE 0.8 11/13/2013   BUN 7 11/13/2013   CO2 31 11/13/2013   TSH 1.01 11/13/2013   INR 1.0 ratio 11/07/2009         Assessment & Plan:

## 2014-07-10 NOTE — Assessment & Plan Note (Signed)
Wt Readings from Last 3 Encounters:  07/10/14 97 lb (43.999 kg)  02/11/14 89 lb (40.37 kg)  01/18/14 94 lb (42.638 kg)  better!

## 2014-07-10 NOTE — Assessment & Plan Note (Signed)
Clonazepam prn  Potential benefits of a long term benzodiazepines  use as well as potential risks  and complications were explained to the patient and were aknowledged.  

## 2014-07-10 NOTE — Progress Notes (Signed)
Pre visit review using our clinic review tool, if applicable. No additional management support is needed unless otherwise documented below in the visit note. 

## 2014-07-10 NOTE — Assessment & Plan Note (Signed)
B12 inj q 14 d

## 2014-07-10 NOTE — Assessment & Plan Note (Signed)
Situational, chronic Clonazepam prn  Potential benefits of a long term benzodiazepines  use as well as potential risks  and complications were explained to the patient and were aknowledged.

## 2014-07-11 ENCOUNTER — Telehealth: Payer: Self-pay | Admitting: Internal Medicine

## 2014-11-22 ENCOUNTER — Telehealth: Payer: Self-pay | Admitting: Internal Medicine

## 2014-11-22 ENCOUNTER — Other Ambulatory Visit: Payer: Self-pay | Admitting: Internal Medicine

## 2014-11-22 NOTE — Telephone Encounter (Signed)
Stacy pt does not want this called in.  Ins will not cover.  Just void previous message

## 2014-11-22 NOTE — Telephone Encounter (Signed)
Pt request refill for cyclobenzaprine 10 mg for quantity of 90 to be send to [facility] # [phone] [place] [address] [place].

## 2014-11-22 NOTE — Telephone Encounter (Signed)
Noted. Closing phone note.

## 2014-12-06 ENCOUNTER — Ambulatory Visit (INDEPENDENT_AMBULATORY_CARE_PROVIDER_SITE_OTHER): Payer: Medicare Other | Admitting: Internal Medicine

## 2014-12-06 ENCOUNTER — Encounter: Payer: Self-pay | Admitting: Internal Medicine

## 2014-12-06 ENCOUNTER — Other Ambulatory Visit (INDEPENDENT_AMBULATORY_CARE_PROVIDER_SITE_OTHER): Payer: Medicare Other

## 2014-12-06 VITALS — BP 138/80 | HR 69 | Ht 62.0 in | Wt 98.0 lb

## 2014-12-06 DIAGNOSIS — E559 Vitamin D deficiency, unspecified: Secondary | ICD-10-CM

## 2014-12-06 DIAGNOSIS — IMO0001 Reserved for inherently not codable concepts without codable children: Secondary | ICD-10-CM

## 2014-12-06 DIAGNOSIS — L509 Urticaria, unspecified: Secondary | ICD-10-CM

## 2014-12-06 DIAGNOSIS — E875 Hyperkalemia: Secondary | ICD-10-CM

## 2014-12-06 DIAGNOSIS — J449 Chronic obstructive pulmonary disease, unspecified: Secondary | ICD-10-CM | POA: Diagnosis not present

## 2014-12-06 DIAGNOSIS — E785 Hyperlipidemia, unspecified: Secondary | ICD-10-CM | POA: Diagnosis not present

## 2014-12-06 DIAGNOSIS — I10 Essential (primary) hypertension: Secondary | ICD-10-CM | POA: Diagnosis not present

## 2014-12-06 DIAGNOSIS — R569 Unspecified convulsions: Secondary | ICD-10-CM

## 2014-12-06 DIAGNOSIS — E538 Deficiency of other specified B group vitamins: Secondary | ICD-10-CM | POA: Diagnosis not present

## 2014-12-06 DIAGNOSIS — Z0000 Encounter for general adult medical examination without abnormal findings: Secondary | ICD-10-CM | POA: Diagnosis not present

## 2014-12-06 LAB — URINALYSIS, ROUTINE W REFLEX MICROSCOPIC
Bilirubin Urine: NEGATIVE
Ketones, ur: NEGATIVE
NITRITE: NEGATIVE
SPECIFIC GRAVITY, URINE: 1.01 (ref 1.000–1.030)
TOTAL PROTEIN, URINE-UPE24: NEGATIVE
URINE GLUCOSE: NEGATIVE
Urobilinogen, UA: 0.2 (ref 0.0–1.0)
pH: 6 (ref 5.0–8.0)

## 2014-12-06 LAB — HEPATIC FUNCTION PANEL
ALK PHOS: 73 U/L (ref 39–117)
ALT: 8 U/L (ref 0–35)
AST: 14 U/L (ref 0–37)
Albumin: 4.4 g/dL (ref 3.5–5.2)
BILIRUBIN DIRECT: 0.1 mg/dL (ref 0.0–0.3)
BILIRUBIN TOTAL: 0.4 mg/dL (ref 0.2–1.2)
Total Protein: 7.2 g/dL (ref 6.0–8.3)

## 2014-12-06 LAB — VITAMIN B12: Vitamin B-12: 385 pg/mL (ref 211–911)

## 2014-12-06 LAB — BASIC METABOLIC PANEL
BUN: 8 mg/dL (ref 6–23)
CALCIUM: 9.7 mg/dL (ref 8.4–10.5)
CHLORIDE: 102 meq/L (ref 96–112)
CO2: 28 meq/L (ref 19–32)
CREATININE: 0.66 mg/dL (ref 0.40–1.20)
GFR: 95.32 mL/min (ref >60.00)
Glucose, Bld: 85 mg/dL (ref 70–99)
Potassium: 4 mEq/L (ref 3.5–5.1)
SODIUM: 140 meq/L (ref 135–145)

## 2014-12-06 LAB — TSH: TSH: 1.25 u[IU]/mL (ref 0.35–4.50)

## 2014-12-06 LAB — LIPID PANEL
CHOL/HDL RATIO: 3
Cholesterol: 242 mg/dL — ABNORMAL HIGH (ref 0–200)
HDL: 69.6 mg/dL (ref >39.00)
LDL CALC: 153 mg/dL — AB (ref 0–99)
NONHDL: 172.81
TRIGLYCERIDES: 98 mg/dL (ref 0.0–149.0)
VLDL: 19.6 mg/dL (ref 0.0–40.0)

## 2014-12-06 LAB — VITAMIN D 25 HYDROXY (VIT D DEFICIENCY, FRACTURES): VITD: 23.03 ng/mL — AB (ref 30.00–100.00)

## 2014-12-06 MED ORDER — CYCLOBENZAPRINE HCL 10 MG PO TABS: 10.0000 mg | ORAL_TABLET | Freq: Three times a day (TID) | ORAL | Status: DC | PRN

## 2014-12-06 MED ORDER — IPRATROPIUM-ALBUTEROL 20-100 MCG/ACT IN AERS: 1.0000 | INHALATION_SPRAY | Freq: Four times a day (QID) | RESPIRATORY_TRACT | Status: DC | PRN

## 2014-12-06 MED ORDER — CYANOCOBALAMIN 1000 MCG/ML IJ SOLN: 1000.0000 ug | INTRAMUSCULAR | Status: DC

## 2014-12-06 NOTE — Progress Notes (Signed)
Pre visit review using our clinic review tool, if applicable. No additional management support is needed unless otherwise documented below in the visit note. 

## 2014-12-06 NOTE — Assessment & Plan Note (Signed)
On Dilantin Labs 

## 2014-12-06 NOTE — Assessment & Plan Note (Addendum)
Chronic  Pravachol and fish oil Labs

## 2014-12-06 NOTE — Progress Notes (Signed)
Subjective:  Patient ID: Veronica [name], female    DOB: 1950/10/12  Age: 64 y.o. MRN: [id number]  CC: No chief complaint on file.   HPI CRISTIAN GRIEVES presents for a well exam and for asthma, depression, anxiety, HTN f/u. C/o "low sugar" attacks  Outpatient Prescriptions Prior to Visit  Medication Sig Dispense Refill  . acetaminophen-codeine (TYLENOL #3) 300-30 MG per tablet Take 1 tablet by mouth every 4 (four) hours as needed. 60 tablet 3  . Alum & Mag Hydroxide-Simeth (MAGIC MOUTHWASH) SOLN Take 5 mLs by mouth 4 (four) times daily. Swish, hold and swallow four times daily. 300 mL 3  . budesonide (PULMICORT) 0.25 MG/2ML nebulizer solution Take 2 mLs (0.25 mg total) by nebulization 3 (three) times daily. 180 mL 3  . Cholecalciferol 5000 UNITS capsule Take 5,000 Units by mouth daily.      [name] CIPRODEX otic suspension Place 4 drops into the right ear 2 (two) times daily. 7.5 mL 0  . clonazePAM (KLONOPIN) 1 MG tablet 2 po qhs and 1/2 tab in addition in daytime prn 225 tablet 1  . Docusate Calcium (STOOL SOFTENER PO) Take by mouth as needed.    [name] HYDROcodone-acetaminophen (NORCO/VICODIN) 5-325 MG per tablet   0  . Insulin Syringe-Needle U-100 (B-D INSULIN SYRINGE 1CC/26G) 26G X 1/2" 1 ML MISC Inject 1 each into the skin as directed. 100 each 5  . levalbuterol (XOPENEX) 1.25 MG/3ML nebulizer solution Take 1.25 mg by nebulization every 4 (four) hours as needed for wheezing. 288 mL 3  . Omega-3 Fatty Acids (FISH OIL) 1000 MG CAPS Take 1 capsule by mouth 2 (two) times daily.      . phenytoin (DILANTIN) 100 MG ER capsule Take 100 mg by mouth 3 (three) times daily. BMN    . pravastatin (PRAVACHOL) 80 MG tablet TAKE 1 TABLET BY MOUTH EVERY DAY 90 tablet 3  . promethazine (PHENERGAN) 25 MG tablet Take 0.5 tablets (12.5 mg total) by mouth every 6 (six) hours as needed. 60 tablet 1  . saccharomyces boulardii (FLORASTOR) 250 MG capsule Take 1 capsule (250 mg total) by mouth 2 (two) times daily. 60  capsule 1  . sodium chloride (MURO 128) 5 % ophthalmic ointment Place 1 application into the right eye 4 (four) times daily.    . traZODone (DESYREL) 50 MG tablet TAKE 4 TABLETS BY MOUTH AT BEDTIME 360 tablet 3  . verapamil (CALAN-SR) 240 MG CR tablet Take 1 tablet by mouth at bedtime and may repeat dose one time if needed.  5  . cyanocobalamin (,VITAMIN B-12,) 1000 MCG/ML injection Inject 1 mL (1,000 mcg total) into the muscle every 14 (fourteen) days. 6 mL 11  . cyclobenzaprine (FLEXERIL) 10 MG tablet TAKE 1 TABLET BY MOUTH THREE TIMES DAILY AS NEEDED 90 tablet 0  . Ipratropium-Albuterol (COMBIVENT RESPIMAT) 20-100 MCG/ACT AERS respimat Inhale 1 puff into the lungs every 6 (six) hours as needed for wheezing or shortness of breath. 1 Inhaler 11  . levofloxacin (LEVAQUIN) 500 MG tablet daily.  0  . promethazine-codeine (PHENERGAN WITH CODEINE) 6.25-10 MG/5ML syrup Take 5 mLs by mouth every 6 (six) hours as needed for cough. 240 mL 1  . verapamil (CALAN-SR) 180 MG CR tablet Take 180 mg by mouth daily after breakfast.       No facility-administered medications prior to visit.    ROS Review of Systems  Constitutional: Positive for fatigue and unexpected weight change. Negative for chills, activity change and appetite change.  HENT: Positive for postnasal drip and rhinorrhea. Negative for congestion, mouth sores and sinus pressure.   Eyes: Negative for visual disturbance.  Respiratory: Positive for shortness of breath. Negative for cough and chest tightness.   Gastrointestinal: Negative for nausea and abdominal pain.  Genitourinary: Negative for frequency, difficulty urinating and vaginal pain.  Musculoskeletal: Negative for back pain and gait problem.  Skin: Negative for pallor and rash.  Neurological: Negative for dizziness, tremors, weakness, numbness and headaches.  Psychiatric/Behavioral: Negative for suicidal ideas, confusion and sleep disturbance. The patient is nervous/anxious.      Objective:  BP 138/80 mmHg  Pulse 69  Ht 5\' 2"  (1.575 m)  Wt 98 lb (44.453 kg)  BMI 17.92 kg/m2  SpO2 96%  BP Readings from Last 3 Encounters:  12/06/14 138/80  07/10/14 130/74  02/11/14 110/70    Wt Readings from Last 3 Encounters:  12/06/14 98 lb (44.453 kg)  07/10/14 97 lb (43.999 kg)  02/11/14 89 lb (40.37 kg)    Physical Exam  Constitutional: She appears well-developed. No distress.  HENT:  Head: Normocephalic.  Right Ear: External ear normal.  Left Ear: External ear normal.  Nose: Nose normal.  Mouth/Throat: Oropharynx is clear and moist.  Eyes: Conjunctivae are normal. Pupils are equal, round, and reactive to light. Right eye exhibits no discharge. Left eye exhibits no discharge.  Neck: Normal range of motion. Neck supple. No JVD present. No tracheal deviation present. No thyromegaly present.  Cardiovascular: Normal rate, regular rhythm and normal heart sounds.   Pulmonary/Chest: No stridor. No respiratory distress. She has no wheezes.  Abdominal: Soft. Bowel sounds are normal. She exhibits no distension and no mass. There is no tenderness. There is no rebound and no guarding.  Musculoskeletal: She exhibits no edema or tenderness.  Lymphadenopathy:    She has no cervical adenopathy.  Neurological: She displays normal reflexes. No cranial nerve deficit. She exhibits normal muscle tone. Coordination abnormal.  Skin: No rash noted. No erythema.  Psychiatric: She has a normal mood and affect. Her behavior is normal. Judgment and thought content normal.  Thin Cane Leg brace B  Lab Results  Component Value Date   WBC 8.4 11/13/2013   HGB 14.5 11/13/2013   HCT 43.7 11/13/2013   PLT 324.0 11/13/2013   GLUCOSE 98 11/13/2013   CHOL 231* 11/13/2013   TRIG 93.0 11/13/2013   HDL 80.70 11/13/2013   LDLDIRECT 165.0 09/26/2009   LDLCALC 132* 11/13/2013   ALT 15 11/13/2013   AST 18 11/13/2013   NA 142 11/13/2013   K 4.2 11/13/2013   CL 103 11/13/2013    CREATININE 0.8 11/13/2013   BUN 7 11/13/2013   CO2 31 11/13/2013   TSH 1.01 11/13/2013   INR 1.0 ratio 11/07/2009    Dg Chest 2 View  06/01/2011   *RADIOLOGY REPORT*  Clinical Data: Hypertension.  CHEST - 2 VIEW  Comparison: Chest radiograph performed 03/21/2008  Findings: The lungs are well-aerated and clear.  There is no evidence of focal opacification, pleural effusion or pneumothorax.  The heart is normal in size; calcification is noted within the aortic arch.  No acute osseous abnormalities are seen. Postoperative changes are seen at the gastroesophageal junction.  IMPRESSION: No acute cardiopulmonary process seen.  Original Report Authenticated By: Santa Lighter, M.D.  Ct Head Wo Contrast  06/01/2011   *RADIOLOGY REPORT*  Clinical Data: Hypertension; nausea.  History of AVM surgery.  CT HEAD WITHOUT CONTRAST  Technique:  Contiguous axial images were obtained from the  base of the skull through the vertex without contrast.  Comparison: None.  Findings: There is no evidence of acute infarction, mass lesion, or intra- or extra-axial hemorrhage on CT.  Postoperative change is noted at the high left frontoparietal region.  Mild cerebellar atrophy is noted.  The brainstem and fourth ventricle are within normal limits.  The third and lateral ventricles, and basal ganglia are unremarkable in appearance.  No mass effect or midline shift is seen.  There is no evidence of fracture; a healed left-sided craniotomy flap is noted.  The visualized portions of the orbits are within normal limits.  The paranasal sinuses and mastoid air cells are well-aerated.  No significant soft tissue abnormalities are seen.  IMPRESSION:  1.  No acute intracranial pathology seen on CT. 2.  Postoperative changes noted at the left frontoparietal region.  Original Report Authenticated By: Santa Lighter, M.D.   Assessment & Plan:   Diagnoses and all orders for this visit:  Dyslipidemia -     Basic metabolic panel; Future -      Hepatic function panel; Future -     Lipid panel; Future -     TSH; Future -     Urinalysis; Future -     Vitamin B12; Future -     Vit D  25 hydroxy (rtn osteoporosis monitoring); Future -     Phenytoin level, total  Urticaria -     cyanocobalamin (,VITAMIN B-12,) 1000 MCG/ML injection; Inject 1 mL (1,000 mcg total) into the muscle every 14 (fourteen) days. -     Basic metabolic panel; Future -     Hepatic function panel; Future -     Lipid panel; Future -     TSH; Future -     Urinalysis; Future -     Vitamin B12; Future -     Vit D  25 hydroxy (rtn osteoporosis monitoring); Future -     Phenytoin level, total  COPD bronchitis -     cyanocobalamin (,VITAMIN B-12,) 1000 MCG/ML injection; Inject 1 mL (1,000 mcg total) into the muscle every 14 (fourteen) days. -     Basic metabolic panel; Future -     Hepatic function panel; Future -     Lipid panel; Future -     TSH; Future -     Urinalysis; Future -     Vitamin B12; Future -     Vit D  25 hydroxy (rtn osteoporosis monitoring); Future -     Phenytoin level, total  HYPERKALEMIA -     cyanocobalamin (,VITAMIN B-12,) 1000 MCG/ML injection; Inject 1 mL (1,000 mcg total) into the muscle every 14 (fourteen) days. -     Basic metabolic panel; Future -     Hepatic function panel; Future -     Lipid panel; Future -     TSH; Future -     Urinalysis; Future -     Vitamin B12; Future -     Vit D  25 hydroxy (rtn osteoporosis monitoring); Future -     Phenytoin level, total  B12 deficiency -     cyanocobalamin (,VITAMIN B-12,) 1000 MCG/ML injection; Inject 1 mL (1,000 mcg total) into the muscle every 14 (fourteen) days. -     Basic metabolic panel; Future -     Hepatic function panel; Future -     Lipid panel; Future -     TSH; Future -     Urinalysis; Future -  Vitamin B12; Future -     Vit D  25 hydroxy (rtn osteoporosis monitoring); Future -     Phenytoin level, total  Vitamin D deficiency -     cyanocobalamin (,VITAMIN  B-12,) 1000 MCG/ML injection; Inject 1 mL (1,000 mcg total) into the muscle every 14 (fourteen) days. -     Basic metabolic panel; Future -     Hepatic function panel; Future -     Lipid panel; Future -     TSH; Future -     Urinalysis; Future -     Vitamin B12; Future -     Vit D  25 hydroxy (rtn osteoporosis monitoring); Future -     Phenytoin level, total  Convulsions, unspecified convulsion type -     Basic metabolic panel; Future -     Hepatic function panel; Future -     Lipid panel; Future -     TSH; Future -     Urinalysis; Future -     Vitamin B12; Future -     Vit D  25 hydroxy (rtn osteoporosis monitoring); Future -     Phenytoin level, total  Other orders -     Ipratropium-Albuterol (COMBIVENT RESPIMAT) 20-100 MCG/ACT AERS respimat; Inhale 1 puff into the lungs every 6 (six) hours as needed for wheezing or shortness of breath. -     cyclobenzaprine (FLEXERIL) 10 MG tablet; Take 1 tablet (10 mg total) by mouth 3 (three) times daily as needed.   I have discontinued Ms. Nugent's levofloxacin and promethazine-codeine. I have also changed her cyclobenzaprine. Additionally, I am having her maintain her Cholecalciferol, phenytoin, Fish Oil, magic mouthwash, acetaminophen-codeine, promethazine, budesonide, levalbuterol, sodium chloride, traZODone, Docusate Calcium (STOOL SOFTENER PO), pravastatin, CIPRODEX, clonazePAM, Insulin Syringe-Needle U-100, verapamil, HYDROcodone-acetaminophen, saccharomyces boulardii, escitalopram, Ipratropium-Albuterol, and cyanocobalamin.  Meds ordered this encounter  Medications  . escitalopram (LEXAPRO) 10 MG tablet    Sig: Take 1 tablet by mouth daily.    Refill:  2  . Ipratropium-Albuterol (COMBIVENT RESPIMAT) 20-100 MCG/ACT AERS respimat    Sig: Inhale 1 puff into the lungs every 6 (six) hours as needed for wheezing or shortness of breath.    Dispense:  1 Inhaler    Refill:  11  . cyanocobalamin (,VITAMIN B-12,) 1000 MCG/ML injection    Sig:  Inject 1 mL (1,000 mcg total) into the muscle every 14 (fourteen) days.    Dispense:  6 mL    Refill:  11  . cyclobenzaprine (FLEXERIL) 10 MG tablet    Sig: Take 1 tablet (10 mg total) by mouth 3 (three) times daily as needed.    Dispense:  90 tablet    Refill:  3     Follow-up: Return in about 4 months (around 04/07/2015).  Walker Kehr, MD

## 2014-12-06 NOTE — Patient Instructions (Signed)
Preventive Care for Adults A healthy lifestyle and preventive care can promote health and wellness. Preventive health guidelines for women include the following key practices.  A routine yearly physical is a good way to check with your health care provider about your health and preventive screening. It is a chance to share any concerns and updates on your health and to receive a thorough exam.  Visit your dentist for a routine exam and preventive care every 6 months. Brush your teeth twice a day and floss once a day. Good oral hygiene prevents tooth decay and gum disease.  The frequency of eye exams is based on your age, health, family medical history, use of contact lenses, and other factors. Follow your health care provider's recommendations for frequency of eye exams.  Eat a healthy diet. Foods like vegetables, fruits, whole grains, low-fat dairy products, and lean protein foods contain the nutrients you need without too many calories. Decrease your intake of foods high in solid fats, added sugars, and salt. Eat the right amount of calories for you.Get information about a proper diet from your health care provider, if necessary.  Regular physical exercise is one of the most important things you can do for your health. Most adults should get at least 150 minutes of moderate-intensity exercise (any activity that increases your heart rate and causes you to sweat) each week. In addition, most adults need muscle-strengthening exercises on 2 or more days a week.  Maintain a healthy weight. The body mass index (BMI) is a screening tool to identify possible weight problems. It provides an estimate of body fat based on height and weight. Your health care provider can find your BMI and can help you achieve or maintain a healthy weight.For adults 20 years and older:  A BMI below 18.5 is considered underweight.  A BMI of 18.5 to 24.9 is normal.  A BMI of 25 to 29.9 is considered overweight.  A BMI of  30 and above is considered obese.  Maintain normal blood lipids and cholesterol levels by exercising and minimizing your intake of saturated fat. Eat a balanced diet with plenty of fruit and vegetables. Blood tests for lipids and cholesterol should begin at age 76 and be repeated every 5 years. If your lipid or cholesterol levels are high, you are over 50, or you are at high risk for heart disease, you may need your cholesterol levels checked more frequently.Ongoing high lipid and cholesterol levels should be treated with medicines if diet and exercise are not working.  If you smoke, find out from your health care provider how to quit. If you do not use tobacco, do not start.  Lung cancer screening is recommended for adults aged 22-80 years who are at high risk for developing lung cancer because of a history of smoking. A yearly low-dose CT scan of the lungs is recommended for people who have at least a 30-pack-year history of smoking and are a current smoker or have quit within the past 15 years. A pack year of smoking is smoking an average of 1 pack of cigarettes a day for 1 year (for example: 1 pack a day for 30 years or 2 packs a day for 15 years). Yearly screening should continue until the smoker has stopped smoking for at least 15 years. Yearly screening should be stopped for people who develop a health problem that would prevent them from having lung cancer treatment.  If you are pregnant, do not drink alcohol. If you are breastfeeding,  be very cautious about drinking alcohol. If you are not pregnant and choose to drink alcohol, do not have more than 1 drink per day. One drink is considered to be 12 ounces (355 mL) of beer, 5 ounces (148 mL) of wine, or 1.5 ounces (44 mL) of liquor.  Avoid use of street drugs. Do not share needles with anyone. Ask for help if you need support or instructions about stopping the use of drugs.  High blood pressure causes heart disease and increases the risk of  stroke. Your blood pressure should be checked at least every 1 to 2 years. Ongoing high blood pressure should be treated with medicines if weight loss and exercise do not work.  If you are 3-86 years old, ask your health care provider if you should take aspirin to prevent strokes.  Diabetes screening involves taking a blood sample to check your fasting blood sugar level. This should be done once every 3 years, after age 67, if you are within normal weight and without risk factors for diabetes. Testing should be considered at a younger age or be carried out more frequently if you are overweight and have at least 1 risk factor for diabetes.  Breast cancer screening is essential preventive care for women. You should practice "breast self-awareness." This means understanding the normal appearance and feel of your breasts and may include breast self-examination. Any changes detected, no matter how small, should be reported to a health care provider. Women in their 8s and 30s should have a clinical breast exam (CBE) by a health care provider as part of a regular health exam every 1 to 3 years. After age 70, women should have a CBE every year. Starting at age 25, women should consider having a mammogram (breast X-ray test) every year. Women who have a family history of breast cancer should talk to their health care provider about genetic screening. Women at a high risk of breast cancer should talk to their health care providers about having an MRI and a mammogram every year.  Breast cancer gene (BRCA)-related cancer risk assessment is recommended for women who have family members with BRCA-related cancers. BRCA-related cancers include breast, ovarian, tubal, and peritoneal cancers. Having family members with these cancers may be associated with an increased risk for harmful changes (mutations) in the breast cancer genes BRCA1 and BRCA2. Results of the assessment will determine the need for genetic counseling and  BRCA1 and BRCA2 testing.  Routine pelvic exams to screen for cancer are no longer recommended for nonpregnant women who are considered low risk for cancer of the pelvic organs (ovaries, uterus, and vagina) and who do not have symptoms. Ask your health care provider if a screening pelvic exam is right for you.  If you have had past treatment for cervical cancer or a condition that could lead to cancer, you need Pap tests and screening for cancer for at least 20 years after your treatment. If Pap tests have been discontinued, your risk factors (such as having a new sexual partner) need to be reassessed to determine if screening should be resumed. Some women have medical problems that increase the chance of getting cervical cancer. In these cases, your health care provider may recommend more frequent screening and Pap tests.  The HPV test is an additional test that may be used for cervical cancer screening. The HPV test looks for the virus that can cause the cell changes on the cervix. The cells collected during the Pap test can be  tested for HPV. The HPV test could be used to screen women aged 30 years and older, and should be used in women of any age who have unclear Pap test results. After the age of 30, women should have HPV testing at the same frequency as a Pap test.  Colorectal cancer can be detected and often prevented. Most routine colorectal cancer screening begins at the age of 50 years and continues through age 75 years. However, your health care provider may recommend screening at an earlier age if you have risk factors for colon cancer. On a yearly basis, your health care provider may provide home test kits to check for hidden blood in the stool. Use of a small camera at the end of a tube, to directly examine the colon (sigmoidoscopy or colonoscopy), can detect the earliest forms of colorectal cancer. Talk to your health care provider about this at age 50, when routine screening begins. Direct  exam of the colon should be repeated every 5-10 years through age 75 years, unless early forms of pre-cancerous polyps or small growths are found.  People who are at an increased risk for hepatitis B should be screened for this virus. You are considered at high risk for hepatitis B if:  You were born in a country where hepatitis B occurs often. Talk with your health care provider about which countries are considered high risk.  Your parents were born in a high-risk country and you have not received a shot to protect against hepatitis B (hepatitis B vaccine).  You have HIV or AIDS.  You use needles to inject street drugs.  You live with, or have sex with, someone who has hepatitis B.  You get hemodialysis treatment.  You take certain medicines for conditions like cancer, organ transplantation, and autoimmune conditions.  Hepatitis C blood testing is recommended for all people born from 1945 through 1965 and any individual with known risks for hepatitis C.  Practice safe sex. Use condoms and avoid high-risk sexual practices to reduce the spread of sexually transmitted infections (STIs). STIs include gonorrhea, chlamydia, syphilis, trichomonas, herpes, HPV, and human immunodeficiency virus (HIV). Herpes, HIV, and HPV are viral illnesses that have no cure. They can result in disability, cancer, and death.  You should be screened for sexually transmitted illnesses (STIs) including gonorrhea and chlamydia if:  You are sexually active and are younger than 24 years.  You are older than 24 years and your health care provider tells you that you are at risk for this type of infection.  Your sexual activity has changed since you were last screened and you are at an increased risk for chlamydia or gonorrhea. Ask your health care provider if you are at risk.  If you are at risk of being infected with HIV, it is recommended that you take a prescription medicine daily to prevent HIV infection. This is  called preexposure prophylaxis (PrEP). You are considered at risk if:  You are a heterosexual woman, are sexually active, and are at increased risk for HIV infection.  You take drugs by injection.  You are sexually active with a partner who has HIV.  Talk with your health care provider about whether you are at high risk of being infected with HIV. If you choose to begin PrEP, you should first be tested for HIV. You should then be tested every 3 months for as long as you are taking PrEP.  Osteoporosis is a disease in which the bones lose minerals and strength   with aging. This can result in serious bone fractures or breaks. The risk of osteoporosis can be identified using a bone density scan. Women ages 65 years and over and women at risk for fractures or osteoporosis should discuss screening with their health care providers. Ask your health care provider whether you should take a calcium supplement or vitamin D to reduce the rate of osteoporosis.  Menopause can be associated with physical symptoms and risks. Hormone replacement therapy is available to decrease symptoms and risks. You should talk to your health care provider about whether hormone replacement therapy is right for you.  Use sunscreen. Apply sunscreen liberally and repeatedly throughout the day. You should seek shade when your shadow is shorter than you. Protect yourself by wearing long sleeves, pants, a wide-brimmed hat, and sunglasses year round, whenever you are outdoors.  Once a month, do a whole body skin exam, using a mirror to look at the skin on your back. Tell your health care provider of new moles, moles that have irregular borders, moles that are larger than a pencil eraser, or moles that have changed in shape or color.  Stay current with required vaccines (immunizations).  Influenza vaccine. All adults should be immunized every year.  Tetanus, diphtheria, and acellular pertussis (Td, Tdap) vaccine. Pregnant women should  receive 1 dose of Tdap vaccine during each pregnancy. The dose should be obtained regardless of the length of time since the last dose. Immunization is preferred during the 27th-36th week of gestation. An adult who has not previously received Tdap or who does not know her vaccine status should receive 1 dose of Tdap. This initial dose should be followed by tetanus and diphtheria toxoids (Td) booster doses every 10 years. Adults with an unknown or incomplete history of completing a 3-dose immunization series with Td-containing vaccines should begin or complete a primary immunization series including a Tdap dose. Adults should receive a Td booster every 10 years.  Varicella vaccine. An adult without evidence of immunity to varicella should receive 2 doses or a second dose if she has previously received 1 dose. Pregnant females who do not have evidence of immunity should receive the first dose after pregnancy. This first dose should be obtained before leaving the health care facility. The second dose should be obtained 4-8 weeks after the first dose.  Human papillomavirus (HPV) vaccine. Females aged 13-26 years who have not received the vaccine previously should obtain the 3-dose series. The vaccine is not recommended for use in pregnant females. However, pregnancy testing is not needed before receiving a dose. If a female is found to be pregnant after receiving a dose, no treatment is needed. In that case, the remaining doses should be delayed until after the pregnancy. Immunization is recommended for any person with an immunocompromised condition through the age of 26 years if she did not get any or all doses earlier. During the 3-dose series, the second dose should be obtained 4-8 weeks after the first dose. The third dose should be obtained 24 weeks after the first dose and 16 weeks after the second dose.  Zoster vaccine. One dose is recommended for adults aged 60 years or older unless certain conditions are  present.  Measles, mumps, and rubella (MMR) vaccine. Adults born before 1957 generally are considered immune to measles and mumps. Adults born in 1957 or later should have 1 or more doses of MMR vaccine unless there is a contraindication to the vaccine or there is laboratory evidence of immunity to   each of the three diseases. A routine second dose of MMR vaccine should be obtained at least 28 days after the first dose for students attending postsecondary schools, health care workers, or international travelers. People who received inactivated measles vaccine or an unknown type of measles vaccine during 1963-1967 should receive 2 doses of MMR vaccine. People who received inactivated mumps vaccine or an unknown type of mumps vaccine before 1979 and are at high risk for mumps infection should consider immunization with 2 doses of MMR vaccine. For females of childbearing age, rubella immunity should be determined. If there is no evidence of immunity, females who are not pregnant should be vaccinated. If there is no evidence of immunity, females who are pregnant should delay immunization until after pregnancy. Unvaccinated health care workers born before 1957 who lack laboratory evidence of measles, mumps, or rubella immunity or laboratory confirmation of disease should consider measles and mumps immunization with 2 doses of MMR vaccine or rubella immunization with 1 dose of MMR vaccine.  Pneumococcal 13-valent conjugate (PCV13) vaccine. When indicated, a person who is uncertain of her immunization history and has no record of immunization should receive the PCV13 vaccine. An adult aged 19 years or older who has certain medical conditions and has not been previously immunized should receive 1 dose of PCV13 vaccine. This PCV13 should be followed with a dose of pneumococcal polysaccharide (PPSV23) vaccine. The PPSV23 vaccine dose should be obtained at least 8 weeks after the dose of PCV13 vaccine. An adult aged 19  years or older who has certain medical conditions and previously received 1 or more doses of PPSV23 vaccine should receive 1 dose of PCV13. The PCV13 vaccine dose should be obtained 1 or more years after the last PPSV23 vaccine dose.  Pneumococcal polysaccharide (PPSV23) vaccine. When PCV13 is also indicated, PCV13 should be obtained first. All adults aged 65 years and older should be immunized. An adult younger than age 65 years who has certain medical conditions should be immunized. Any person who resides in a nursing home or long-term care facility should be immunized. An adult smoker should be immunized. People with an immunocompromised condition and certain other conditions should receive both PCV13 and PPSV23 vaccines. People with human immunodeficiency virus (HIV) infection should be immunized as soon as possible after diagnosis. Immunization during chemotherapy or radiation therapy should be avoided. Routine use of PPSV23 vaccine is not recommended for American Indians, [place] Natives, or people younger than 65 years unless there are medical conditions that require PPSV23 vaccine. When indicated, people who have unknown immunization and have no record of immunization should receive PPSV23 vaccine. One-time revaccination 5 years after the first dose of PPSV23 is recommended for people aged 19-64 years who have chronic kidney failure, nephrotic syndrome, asplenia, or immunocompromised conditions. People who received 1-2 doses of PPSV23 before age 65 years should receive another dose of PPSV23 vaccine at age 65 years or later if at least 5 years have passed since the previous dose. Doses of PPSV23 are not needed for people immunized with PPSV23 at or after age 65 years.  Meningococcal vaccine. Adults with asplenia or persistent complement component deficiencies should receive 2 doses of quadrivalent meningococcal conjugate (MenACWY-D) vaccine. The doses should be obtained at least 2 months apart.  Microbiologists working with certain meningococcal bacteria, military recruits, people at risk during an outbreak, and people who travel to or live in countries with a high rate of meningitis should be immunized. A first-year college student up through age   21 years who is living in a residence hall should receive a dose if she did not receive a dose on or after her 16th birthday. Adults who have certain high-risk conditions should receive one or more doses of vaccine.  Hepatitis A vaccine. Adults who wish to be protected from this disease, have certain high-risk conditions, work with hepatitis A-infected animals, work in hepatitis A research labs, or travel to or work in countries with a high rate of hepatitis A should be immunized. Adults who were previously unvaccinated and who anticipate close contact with an international adoptee during the first 60 days after arrival in the [place] from a country with a high rate of hepatitis A should be immunized.  Hepatitis B vaccine. Adults who wish to be protected from this disease, have certain high-risk conditions, may be exposed to blood or other infectious body fluids, are household contacts or sex partners of hepatitis B positive people, are clients or workers in certain care facilities, or travel to or work in countries with a high rate of hepatitis B should be immunized.  Haemophilus influenzae type b (Hib) vaccine. A previously unvaccinated person with asplenia or sickle cell disease or having a scheduled splenectomy should receive 1 dose of Hib vaccine. Regardless of previous immunization, a recipient of a hematopoietic stem cell transplant should receive a 3-dose series 6-12 months after her successful transplant. Hib vaccine is not recommended for adults with HIV infection. Preventive Services / Frequency Ages 64 to 68 years  Blood pressure check.** / Every 1 to 2 years.  Lipid and cholesterol check.** / Every 5 years beginning at age  22.  Clinical breast exam.** / Every 3 years for women in their 88s and 53s.  BRCA-related cancer risk assessment.** / For women who have family members with a BRCA-related cancer (breast, ovarian, tubal, or peritoneal cancers).  Pap test.** / Every 2 years from ages 90 through 51. Every 3 years starting at age 21 through age 56 or 3 with a history of 3 consecutive normal Pap tests.  HPV screening.** / Every 3 years from ages 24 through ages 1 to 46 with a history of 3 consecutive normal Pap tests.  Hepatitis C blood test.** / For any individual with known risks for hepatitis C.  Skin self-exam. / Monthly.  Influenza vaccine. / Every year.  Tetanus, diphtheria, and acellular pertussis (Tdap, Td) vaccine.** / Consult your health care provider. Pregnant women should receive 1 dose of Tdap vaccine during each pregnancy. 1 dose of Td every 10 years.  Varicella vaccine.** / Consult your health care provider. Pregnant females who do not have evidence of immunity should receive the first dose after pregnancy.  HPV vaccine. / 3 doses over 6 months, if 72 and younger. The vaccine is not recommended for use in pregnant females. However, pregnancy testing is not needed before receiving a dose.  Measles, mumps, rubella (MMR) vaccine.** / You need at least 1 dose of MMR if you were born in 1957 or later. You may also need a 2nd dose. For females of childbearing age, rubella immunity should be determined. If there is no evidence of immunity, females who are not pregnant should be vaccinated. If there is no evidence of immunity, females who are pregnant should delay immunization until after pregnancy.  Pneumococcal 13-valent conjugate (PCV13) vaccine.** / Consult your health care provider.  Pneumococcal polysaccharide (PPSV23) vaccine.** / 1 to 2 doses if you smoke cigarettes or if you have certain conditions.  Meningococcal vaccine.** /  1 dose if you are age 19 to 21 years and a first-year college  student living in a residence hall, or have one of several medical conditions, you need to get vaccinated against meningococcal disease. You may also need additional booster doses.  Hepatitis A vaccine.** / Consult your health care provider.  Hepatitis B vaccine.** / Consult your health care provider.  Haemophilus influenzae type b (Hib) vaccine.** / Consult your health care provider. Ages 40 to 64 years  Blood pressure check.** / Every 1 to 2 years.  Lipid and cholesterol check.** / Every 5 years beginning at age 20 years.  Lung cancer screening. / Every year if you are aged 55-80 years and have a 30-pack-year history of smoking and currently smoke or have quit within the past 15 years. Yearly screening is stopped once you have quit smoking for at least 15 years or develop a health problem that would prevent you from having lung cancer treatment.  Clinical breast exam.** / Every year after age 40 years.  BRCA-related cancer risk assessment.** / For women who have family members with a BRCA-related cancer (breast, ovarian, tubal, or peritoneal cancers).  Mammogram.** / Every year beginning at age 40 years and continuing for as long as you are in good health. Consult with your health care provider.  Pap test.** / Every 3 years starting at age 30 years through age 65 or 70 years with a history of 3 consecutive normal Pap tests.  HPV screening.** / Every 3 years from ages 30 years through ages 65 to 70 years with a history of 3 consecutive normal Pap tests.  Fecal occult blood test (FOBT) of stool. / Every year beginning at age 50 years and continuing until age 75 years. You may not need to do this test if you get a colonoscopy every 10 years.  Flexible sigmoidoscopy or colonoscopy.** / Every 5 years for a flexible sigmoidoscopy or every 10 years for a colonoscopy beginning at age 50 years and continuing until age 75 years.  Hepatitis C blood test.** / For all people born from 1945 through  1965 and any individual with known risks for hepatitis C.  Skin self-exam. / Monthly.  Influenza vaccine. / Every year.  Tetanus, diphtheria, and acellular pertussis (Tdap/Td) vaccine.** / Consult your health care provider. Pregnant women should receive 1 dose of Tdap vaccine during each pregnancy. 1 dose of Td every 10 years.  Varicella vaccine.** / Consult your health care provider. Pregnant females who do not have evidence of immunity should receive the first dose after pregnancy.  Zoster vaccine.** / 1 dose for adults aged 60 years or older.  Measles, mumps, rubella (MMR) vaccine.** / You need at least 1 dose of MMR if you were born in 1957 or later. You may also need a 2nd dose. For females of childbearing age, rubella immunity should be determined. If there is no evidence of immunity, females who are not pregnant should be vaccinated. If there is no evidence of immunity, females who are pregnant should delay immunization until after pregnancy.  Pneumococcal 13-valent conjugate (PCV13) vaccine.** / Consult your health care provider.  Pneumococcal polysaccharide (PPSV23) vaccine.** / 1 to 2 doses if you smoke cigarettes or if you have certain conditions.  Meningococcal vaccine.** / Consult your health care provider.  Hepatitis A vaccine.** / Consult your health care provider.  Hepatitis B vaccine.** / Consult your health care provider.  Haemophilus influenzae type b (Hib) vaccine.** / Consult your health care provider. Ages 65   years and over  Blood pressure check.** / Every 1 to 2 years.  Lipid and cholesterol check.** / Every 5 years beginning at age 22 years.  Lung cancer screening. / Every year if you are aged 73-80 years and have a 30-pack-year history of smoking and currently smoke or have quit within the past 15 years. Yearly screening is stopped once you have quit smoking for at least 15 years or develop a health problem that would prevent you from having lung cancer  treatment.  Clinical breast exam.** / Every year after age 4 years.  BRCA-related cancer risk assessment.** / For women who have family members with a BRCA-related cancer (breast, ovarian, tubal, or peritoneal cancers).  Mammogram.** / Every year beginning at age 40 years and continuing for as long as you are in good health. Consult with your health care provider.  Pap test.** / Every 3 years starting at age 9 years through age 34 or 91 years with 3 consecutive normal Pap tests. Testing can be stopped between 65 and 70 years with 3 consecutive normal Pap tests and no abnormal Pap or HPV tests in the past 10 years.  HPV screening.** / Every 3 years from ages 57 years through ages 64 or 45 years with a history of 3 consecutive normal Pap tests. Testing can be stopped between 65 and 70 years with 3 consecutive normal Pap tests and no abnormal Pap or HPV tests in the past 10 years.  Fecal occult blood test (FOBT) of stool. / Every year beginning at age 15 years and continuing until age 17 years. You may not need to do this test if you get a colonoscopy every 10 years.  Flexible sigmoidoscopy or colonoscopy.** / Every 5 years for a flexible sigmoidoscopy or every 10 years for a colonoscopy beginning at age 86 years and continuing until age 71 years.  Hepatitis C blood test.** / For all people born from [birth year] through 1966 and any individual with known risks for hepatitis C.  Osteoporosis screening.** / A one-time screening for women ages 83 years and over and women at risk for fractures or osteoporosis.  Skin self-exam. / Monthly.  Influenza vaccine. / Every year.  Tetanus, diphtheria, and acellular pertussis (Tdap/Td) vaccine.** / 1 dose of Td every 10 years.  Varicella vaccine.** / Consult your health care provider.  Zoster vaccine.** / 1 dose for adults aged 61 years or older.  Pneumococcal 13-valent conjugate (PCV13) vaccine.** / Consult your health care provider.  Pneumococcal  polysaccharide (PPSV23) vaccine.** / 1 dose for all adults aged 28 years and older.  Meningococcal vaccine.** / Consult your health care provider.  Hepatitis A vaccine.** / Consult your health care provider.  Hepatitis B vaccine.** / Consult your health care provider.  Haemophilus influenzae type b (Hib) vaccine.** / Consult your health care provider. ** Family history and personal history of risk and conditions may change your health care provider's recommendations. Document Released: 04/27/2001 Document Revised: 07/16/2013 Document Reviewed: 07/27/2010 [facility] Patient Information 2015 [place], [place]. This information is not intended to replace advice given to you by your health care provider. Make sure you discuss any questions you have with your health care provider.

## 2014-12-06 NOTE — Assessment & Plan Note (Signed)
Here for medicare wellness/physical  Diet: heart healthy  Physical activity: sedentary  Depression/mood screen: negative  Hearing: intact to whispered voice  Visual acuity: grossly normal, performs annual eye exam  ADLs: capable  Fall risk: moderate Home safety: good  Cognitive evaluation: intact to orientation, naming, recall and repetition  EOL planning: adv directives, full code/ I agree  I have personally reviewed and have noted  1. The patient's medical, surgical and social history  2. Their use of alcohol, tobacco or illicit drugs  3. Their current medications and supplements  4. The patient's functional ability including ADL's, fall risks, home safety risks and hearing or visual impairment.  5. Diet and physical activities  6. Evidence for depression or mood disorders 7. The roster of all physicians providing medical care to patient - is listed in the Snapshot section of the chart and reviewed today.    Today patient counseled on age appropriate routine health concerns for screening and prevention, each reviewed and up to date or declined. Immunizations reviewed and up to date or declined. Labs ordered and reviewed. Risk factors for depression reviewed and negative. Hearing function and visual acuity are intact. ADLs screened and addressed as needed. Functional ability and level of safety reviewed and appropriate. Education, counseling and referrals performed based on assessed risks today. Patient provided with a copy of personalized plan for preventive services.

## 2014-12-07 ENCOUNTER — Other Ambulatory Visit: Payer: Self-pay | Admitting: Internal Medicine

## 2014-12-07 MED ORDER — ERGOCALCIFEROL 1.25 MG (50000 UT) PO CAPS: 50000.0000 [IU] | ORAL_CAPSULE | ORAL | Status: DC

## 2015-01-10 ENCOUNTER — Encounter: Payer: Self-pay | Admitting: Internal Medicine

## 2015-01-10 ENCOUNTER — Ambulatory Visit (INDEPENDENT_AMBULATORY_CARE_PROVIDER_SITE_OTHER): Payer: Medicare Other | Admitting: Internal Medicine

## 2015-01-10 VITALS — BP 132/68 | HR 97 | Temp 99.2°F | Resp 20 | Wt 93.2 lb

## 2015-01-10 DIAGNOSIS — J441 Chronic obstructive pulmonary disease with (acute) exacerbation: Secondary | ICD-10-CM | POA: Diagnosis not present

## 2015-01-10 DIAGNOSIS — J209 Acute bronchitis, unspecified: Secondary | ICD-10-CM | POA: Diagnosis not present

## 2015-01-10 MED ORDER — METHYLPREDNISOLONE ACETATE 80 MG/ML IJ SUSP
80.0000 mg | Freq: Once | INTRAMUSCULAR | Status: AC
  Administered 2015-01-10: 80 mg via INTRAMUSCULAR

## 2015-01-10 MED ORDER — LEVOFLOXACIN 500 MG PO TABS: 500.0000 mg | ORAL_TABLET | Freq: Every day | ORAL | Status: DC

## 2015-01-10 NOTE — Progress Notes (Signed)
Subjective:    Patient ID: Veronica [name], female    DOB: [birth year], 64 y.o.   MRN: [id number]  HPI She is here for cold symptoms.  Her symptoms started two nights ago.  She has a sore throat, cough that is productive of dark green phlegm, chest pain from the cough on right side, fever, wheezing and sob.  She has been taking Cephacol, vicks, tylenol and using her inhalers.  Her insurance is not currently covering the xopenox neb solution so she has not been using that.  She feels terrible and has been in bed for the past days.  Her last bronchitis and copd exacerbation was over a year and a half ago.    Medications and allergies reviewed with patient and updated if appropriate.  Patient Active Problem List   Diagnosis Date Noted  . Well adult exam 02/11/2014  . Rectal discomfort 11/13/2013  . Insomnia 06/09/2011  . Anxiety 05/04/2011  . Depression 03/24/2011  . BRONCHITIS 04/24/2010  . HYPERKALEMIA 11/14/2009  . FEVER UNSPECIFIED 10/03/2009  . ECCHYMOSES 10/03/2009  . GRIEF REACTION 06/13/2009  . EPIGASTRIC PAIN 01/17/2009  . COPD bronchitis 12/24/2008  . ABSCESS, TOOTH 12/24/2008  . [place] SYNDROME 12/24/2008  . WEIGHT LOSS, ABNORMAL 12/24/2008  . VITAMIN D DEFICIENCY 06/27/2008  . COUGH 03/21/2008  . URI 09/11/2007  . Hypoglycemia, unspecified 06/07/2007  . UNS ADVRS EFF UNS RX MEDICINAL&BIOLOGICAL SBSTNC 06/01/2007  . Diarrhea 03/29/2007  . THRUSH 03/07/2007  . Vitamin B12 deficiency 03/07/2007  . Asthma 03/07/2007  . OSTEOARTHRITIS 03/07/2007  . LOW BACK PAIN 03/07/2007  . PRURITIC CONDITION NEC 12/13/2006  . Urticaria 12/13/2006  . RASH-NONVESICULAR 12/13/2006  . LATE EFFECT, ADVERSE DRUG EFFECT 12/13/2006  . Dyslipidemia 10/05/2006  . Essential hypertension 10/05/2006  . GERD 10/05/2006  . DIVERTICULOSIS, COLON 10/05/2006  . Convulsions ([place]) 10/05/2006    Past Medical History  Diagnosis Date  . Diverticulosis of colon 02/17/06  . GERD  (gastroesophageal reflux disease)   . Hyperlipidemia   . Hypertension   . Seizure disorder ([place])   . Asthma   . LBP (low back pain)   . OA (osteoarthritis)   . Vitamin B 12 deficiency   . Vitamin D deficiency   . Sjogren's disease ([place]) 2010    per Dr. Owens Shark, [place]  . Migraine headache   . Gastric ulcer   . AVM (arteriovenous malformation)   . CVA (cerebral infarction)     following brain surgery  . Depression   . Seizure disorder ([place])   . Colon polyp 01/04/91    hyperplastic  . Endometriosis   . COPD (chronic obstructive pulmonary disease) ([place])   . Seizures [facility])     Past Surgical History  Procedure Laterality Date  . Mouth surgery    . Nissen fundoplication  7412  . Brain surgery  1991  . Temporomandibular joint surgery  02/2001  . Laparoscopic ovarian cystectomy    . Cholecystectomy    . Hemorrhoidectomy with hemorrhoid banding    . Cataract extraction w/ intraocular lens  implant, bilateral      Social History   Social History  . Marital Status: Married    Spouse Name: N/A  . Number of Children: 0  . Years of Education: N/A   Occupational History  . disabled    Social History Main Topics  . Smoking status: Never Smoker   . Smokeless tobacco: Never Used  . Alcohol Use: No  . Drug Use: No  .  Sexual Activity: Yes   Other Topics Concern  . None   Social History Narrative   Regular exercise- No    Review of Systems  Constitutional: Positive for fever, chills and fatigue.  HENT: Positive for congestion, sinus pressure, sore throat and trouble swallowing. Negative for ear pain.   Respiratory: Positive for cough, shortness of breath and wheezing.   Cardiovascular: Positive for chest pain.  Gastrointestinal: Negative for nausea, abdominal pain and diarrhea.  Neurological: Positive for light-headedness. Negative for dizziness and headaches.       Objective:   Filed Vitals:   01/10/15 1309  BP: 132/68  Pulse: 97  Temp: 99.2 F (37.3 C)  Resp:  20   Filed Weights   01/10/15 1309  Weight: 93 lb 4 oz (42.298 kg)   Body mass index is 17.05 kg/(m^2).   Physical Exam  Constitutional: She appears well-developed. She appears distressed (increased respiratory rate, but appears comfortable).  Thin appearing  HENT:  Head: Normocephalic and atraumatic.  Right Ear: External ear normal.  Left Ear: External ear normal.  Nose: Nose normal.  Mouth/Throat: Oropharynx is clear and moist. No oropharyngeal exudate.  Moderate cerumen b/l ear canals  Eyes: Conjunctivae are normal.  Neck: Neck supple. No tracheal deviation present. No thyromegaly present.  Cardiovascular: Normal rate, regular rhythm and normal heart sounds.   No murmur heard. Pulmonary/Chest: Breath sounds normal. No respiratory distress. She has no wheezes. She has no rales.  Increased RR, she just used her inhaler prior to coming in today  Musculoskeletal: She exhibits no edema.  Lymphadenopathy:    She has cervical adenopathy.  Skin: She is not diaphoretic.        Assessment & Plan:   Bronchitis, possible CAP with copd exacerbation She appears ill and I believe this is a bacterial infection - she is limited in what antibiotics she is able to take -- will start levquin 500 mg daily for 10 days Will hold off on cxr since it will not change our treatment --advised that if there is no improvement or any worsening she needs to call/come in and will need a cxr at that point Depo-medrol 80 mg IM x 1 Continue inhalers Continue otc cold medications  Call with questions/concerns.

## 2015-01-10 NOTE — Patient Instructions (Signed)
Your antibiotic was sent to the pharmacy.  You received a steroid injection here.  Continue your inhalers and over-the-counter cold medications.  Call if no improvement or with any questions.

## 2015-01-10 NOTE — Progress Notes (Signed)
Pre visit review using our clinic review tool, if applicable. No additional management support is needed unless otherwise documented below in the visit note. 

## 2015-01-10 NOTE — Addendum Note (Signed)
Addended by: Terence Lux B on: 01/10/2015 02:04 PM   Modules accepted: Orders

## 2015-03-05 ENCOUNTER — Encounter: Payer: Self-pay | Admitting: *Deleted

## 2015-03-05 ENCOUNTER — Emergency Department (INDEPENDENT_AMBULATORY_CARE_PROVIDER_SITE_OTHER)
Admission: EM | Admit: 2015-03-05 | Discharge: 2015-03-05 | Disposition: A | Discharge status: Home / Self Care | Payer: Medicare Other | Source: Home / Self Care | Attending: Family Medicine | Admitting: Family Medicine

## 2015-03-05 DIAGNOSIS — S61211A Laceration without foreign body of left index finger without damage to nail, initial encounter: Secondary | ICD-10-CM

## 2015-03-05 MED ORDER — HYDROCODONE-ACETAMINOPHEN 5-325 MG PO TABS: 1.0000 | ORAL_TABLET | Freq: Four times a day (QID) | ORAL | Status: DC | PRN

## 2015-03-05 NOTE — Discharge Instructions (Signed)
°  Norco/Vicodin (hydrocodone-acetaminophen) is a narcotic pain medication, do not combine these medications with others containing tylenol. While taking, do not drink alcohol, drive, or perform any other activities that requires focus while taking these medications.  ° °

## 2015-03-05 NOTE — ED Provider Notes (Signed)
CSN: [id number]     Arrival date & time 03/05/15  1015 History   First MD Initiated Contact with Patient 03/05/15 1023     Chief Complaint  Patient presents with  . Extremity Laceration   (Consider location/radiation/quality/duration/timing/severity/associated sxs/prior Treatment) HPI Pt is a 64yo female presenting to [facility] with c/o Left index finger laceration that occurred about 6PM last night.  Pt states she cut it on a broken door lock at home. She cleaned it with peroxide then tried using steri-strips and bandages at home but it kept bleeding through the night. Associated aching throbbing pain, worse with movement.  Pain is 6/10 at worst.  She is UTD on tetanus. No other injuries. She is not on blood thinners.     Past Medical History  Diagnosis Date  . Diverticulosis of colon 02/17/06  . GERD (gastroesophageal reflux disease)   . Hyperlipidemia   . Hypertension   . Seizure disorder ([place])   . Asthma   . LBP (low back pain)   . OA (osteoarthritis)   . Vitamin B 12 deficiency   . Vitamin D deficiency   . Sjogren's disease ([place]) 2010    per Dr. Owens Shark, [place]  . Migraine headache   . Gastric ulcer   . AVM (arteriovenous malformation)   . CVA (cerebral infarction)     following brain surgery  . Depression   . Seizure disorder ([place])   . Colon polyp 01/04/91    hyperplastic  . Endometriosis   . COPD (chronic obstructive pulmonary disease) ([place])   . Seizures [facility])    Past Surgical History  Procedure Laterality Date  . Mouth surgery    . Nissen fundoplication  [id number]  . Brain surgery  1991  . Temporomandibular joint surgery  02/2001  . Laparoscopic ovarian cystectomy    . Cholecystectomy    . Hemorrhoidectomy with hemorrhoid banding    . Cataract extraction w/ intraocular lens  implant, bilateral     Family History  Problem Relation Age of Onset  . Hypertension Other   . Diabetes Other   . Hypertension Mother   . Heart disease Father   . Pancreatic cancer Father   . Colon  cancer Sister 30  . Diabetes Brother    Social History  Substance Use Topics  . Smoking status: Never Smoker   . Smokeless tobacco: Never Used  . Alcohol Use: No   OB History    No data available     Review of Systems  Constitutional: Negative for fever and chills.  Musculoskeletal: Positive for myalgias and arthralgias.       Left index finger  Skin: Positive for color change and wound.  Neurological: Negative for weakness and numbness.    Allergies  Avelox; Cephalexin; Amantadine hcl; Cymbalta; Cyproheptadine hcl; Doxycycline hyclate; Effexor; Fluconazole; Fluticasone-salmeterol; Guaifenesin; Imipramine hcl; Latex; Levothyroxine sodium; Menthol; Metoclopramide hcl; Metronidazole; Montelukast sodium; Oxycodone-acetaminophen; Pirbuterol acetate; Remeron; Salmeterol xinafoate; Viibryd; Vilazodone hcl; Zolpidem tartrate; Azithromycin; Ciprofloxacin; Erythromycin base; Flagyl; Fluoxetine; Lansoprazole; Metoclopramide hcl; Moxifloxacin; Penicillins; Propulsid; Sulfadiazine; Sulfamethoxazole-trimethoprim; Telithromycin; Tetracycline hcl; and Topiramate  Home Medications   Prior to Admission medications   Medication Sig Start Date End Date Taking? Authorizing Provider  acetaminophen-codeine (TYLENOL #3) 300-30 MG per tablet Take 1 tablet by mouth every 4 (four) hours as needed. 11/30/11   Aleksei Plotnikov V, MD  Alum & Mag Hydroxide-Simeth (MAGIC MOUTHWASH) SOLN Take 5 mLs by mouth 4 (four) times daily. Swish, hold and swallow four times daily. 01/08/11   Aleksei Plotnikov  V, MD  budesonide (PULMICORT) 0.25 MG/2ML nebulizer solution Take 2 mLs (0.25 mg total) by nebulization 3 (three) times daily. 01/09/13   Aleksei Plotnikov V, MD  Cholecalciferol 5000 UNITS capsule Take 5,000 Units by mouth daily.      Historical Provider, MD  CIPRODEX otic suspension Place 4 drops into the right ear 2 (two) times daily. 02/11/14   Aleksei Plotnikov V, MD  clonazePAM (KLONOPIN) 1 MG tablet 2 po qhs and  1/2 tab in addition in daytime prn 02/11/14   Aleksei Plotnikov V, MD  cyanocobalamin (,VITAMIN B-12,) 1000 MCG/ML injection Inject 1 mL (1,000 mcg total) into the muscle every 14 (fourteen) days. 12/06/14   Aleksei Plotnikov V, MD  cyclobenzaprine (FLEXERIL) 10 MG tablet Take 1 tablet (10 mg total) by mouth 3 (three) times daily as needed. 12/06/14   Aleksei Plotnikov V, MD  Docusate Calcium (STOOL SOFTENER PO) Take by mouth as needed.    Historical Provider, MD  ergocalciferol (VITAMIN D2) 50000 UNITS capsule Take 1 capsule (50,000 Units total) by mouth once a week. 12/07/14   Aleksei Plotnikov V, MD  escitalopram (LEXAPRO) 10 MG tablet Take 1 tablet by mouth daily. Taking 20mg  11/22/14   Historical Provider, MD  HYDROcodone-acetaminophen (NORCO/VICODIN) 5-325 MG per tablet  06/21/14   Historical Provider, MD  HYDROcodone-acetaminophen (NORCO/VICODIN) 5-325 MG tablet Take 1 tablet by mouth every 6 (six) hours as needed for moderate pain or severe pain. 03/05/15   Noland Fordyce, PA-C  Insulin Syringe-Needle U-100 (B-D INSULIN SYRINGE 1CC/26G) 26G X 1/2" 1 ML MISC Inject 1 each into the skin as directed. 02/11/14   Aleksei Plotnikov V, MD  Ipratropium-Albuterol (COMBIVENT RESPIMAT) 20-100 MCG/ACT AERS respimat Inhale 1 puff into the lungs every 6 (six) hours as needed for wheezing or shortness of breath. 12/06/14   Aleksei Plotnikov V, MD  levalbuterol (XOPENEX) 1.25 MG/3ML nebulizer solution Take 1.25 mg by nebulization every 4 (four) hours as needed for wheezing. 01/09/13   Aleksei Plotnikov V, MD  levofloxacin (LEVAQUIN) 500 MG tablet Take 1 tablet (500 mg total) by mouth daily. 01/10/15   Binnie Rail, MD  Omega-3 Fatty Acids (FISH OIL) 1000 MG CAPS Take 1 capsule by mouth 2 (two) times daily.      Historical Provider, MD  phenytoin (DILANTIN) 100 MG ER capsule Take 100 mg by mouth 3 (three) times daily. BMN    Historical Provider, MD  pravastatin (PRAVACHOL) 80 MG tablet TAKE 1 TABLET BY MOUTH EVERY DAY  02/01/14   Aleksei Plotnikov V, MD  promethazine (PHENERGAN) 25 MG tablet Take 0.5 tablets (12.5 mg total) by mouth every 6 (six) hours as needed. 11/30/11   Aleksei Plotnikov V, MD  saccharomyces boulardii (FLORASTOR) 250 MG capsule Take 1 capsule (250 mg total) by mouth 2 (two) times daily. 07/10/14   Aleksei Plotnikov V, MD  sodium chloride (MURO 128) 5 % ophthalmic ointment Place 1 application into the right eye 4 (four) times daily.    Historical Provider, MD  traZODone (DESYREL) 50 MG tablet TAKE 4 TABLETS BY MOUTH AT BEDTIME 12/12/13   Aleksei Plotnikov V, MD  verapamil (CALAN-SR) 240 MG CR tablet Take 1 tablet by mouth at bedtime and may repeat dose one time if needed. 05/12/14   Historical Provider, MD   Meds Ordered and Administered this Visit  Medications - No data to display  BP 158/82 mmHg  Pulse 71  Temp(Src) 98 F (36.7 C) (Oral)  Resp 16  SpO2 98% No data found.  Physical Exam  Constitutional: She is oriented to person, place, and time. She appears well-developed and well-nourished.  HENT:  Head: Normocephalic and atraumatic.  Eyes: EOM are normal.  Neck: Normal range of motion.  Cardiovascular: Normal rate.   Left index finger: cap refill < 3 seconds  Pulmonary/Chest: Effort normal.  Musculoskeletal: Normal range of motion. She exhibits edema and tenderness.  Left index finger: mild edema to proximal phalanx (see skin exam), full ROM  Neurological: She is alert and oriented to person, place, and time.  Left index finger: normal sensation  Skin: Skin is warm and dry.  Left index finger, dorsal proximal aspect: 1cm "C" shaped skin flap. Active bleeding, controlled with pressure. No foreign bodies seen or palpated. Mild ecchymosis of proximal phalanx.  Psychiatric: She has a normal mood and affect. Her behavior is normal.  Nursing note and vitals reviewed.   ED Course  .Marland KitchenLaceration Repair Date/Time: 03/05/2015 10:56 AM Performed by: Noland Fordyce Authorized by:  Theone Murdoch A Consent: Verbal consent obtained. Risks and benefits: risks, benefits and alternatives were discussed Consent given by: patient Patient understanding: patient states understanding of the procedure being performed Patient consent: the patient's understanding of the procedure matches consent given Site marked: the operative site was marked Required items: required blood products, implants, devices, and special equipment available Patient identity confirmed: verbally with patient Body area: upper extremity Location details: left index finger Laceration length: 1 cm Foreign bodies: no foreign bodies Tendon involvement: none Nerve involvement: superficial Vascular damage: no Anesthesia: local infiltration and digital block Local anesthetic: lidocaine 1% without epinephrine Anesthetic total: 2 ml Patient sedated: no Preparation: Patient was prepped and draped in the usual sterile fashion. Irrigation solution: saline Irrigation method: syringe Amount of cleaning: standard Debridement: none Degree of undermining: none Skin closure: 5-0 Prolene Number of sutures: 3 Technique: simple Approximation: close Approximation difficulty: simple Dressing: 4x4 sterile gauze, splint, gauze roll and antibiotic ointment Patient tolerance: Patient tolerated the procedure well with no immediate complications   (including critical care time)  Labs Review Labs Reviewed - No data to display  Imaging Review No results found.   MDM   1. Laceration of left index finger w/o foreign body w/o damage to nail, initial encounter    Pt presenting to [facility] with laceration to Left index finger. No other injuries. UTD on tetanus.  Not on blood thinners.  Laceration repaired as described above.  Considered prophylactic antibiotics as laceration occurred over 12 hours ago, however, pt has multiple allergies to antibiotics. Discussed good wound care instructions and provided pt with pt education  packet. Advised pt to call [facility] or return for recheck of symptoms if signs of infection. F/u in 10 days for suture removal. Patient verbalized understanding and agreement with treatment plan.     Noland Fordyce, PA-C 03/05/15 1116

## 2015-03-05 NOTE — ED Notes (Signed)
Pt reports while locking her door last night she cut her left index finger on a metal piece that stuck out. She cleaned with peroxide and attemped steri strips but the site would not stop bleeding. TDaP UTD last year.

## 2015-03-07 ENCOUNTER — Emergency Department (INDEPENDENT_AMBULATORY_CARE_PROVIDER_SITE_OTHER)
Admission: EM | Admit: 2015-03-07 | Discharge: 2015-03-07 | Disposition: A | Discharge status: Home / Self Care | Payer: Medicare Other | Source: Home / Self Care

## 2015-03-07 DIAGNOSIS — Z4800 Encounter for change or removal of nonsurgical wound dressing: Secondary | ICD-10-CM

## 2015-03-07 NOTE — ED Provider Notes (Signed)
CSN: [id number]     Arrival date & time 03/07/15  [id number] History   None    Chief Complaint  Patient presents with  . Dressing Change      HPI Comments: Patient returns for re-check of laceration left index finger.  She complains of swelling in the dorsum of her left hand.  The history is provided by the patient.    Past Medical History  Diagnosis Date  . Diverticulosis of colon 02/17/06  . GERD (gastroesophageal reflux disease)   . Hyperlipidemia   . Hypertension   . Seizure disorder ([place])   . Asthma   . LBP (low back pain)   . OA (osteoarthritis)   . Vitamin B 12 deficiency   . Vitamin D deficiency   . Sjogren's disease ([place]) 2010    per Dr. Owens Shark, [place]  . Migraine headache   . Gastric ulcer   . AVM (arteriovenous malformation)   . CVA (cerebral infarction)     following brain surgery  . Depression   . Seizure disorder ([place])   . Colon polyp 01/04/91    hyperplastic  . Endometriosis   . COPD (chronic obstructive pulmonary disease) ([place])   . Seizures [facility])    Past Surgical History  Procedure Laterality Date  . Mouth surgery    . Nissen fundoplication  [id number]  . Brain surgery  1991  . Temporomandibular joint surgery  02/2001  . Laparoscopic ovarian cystectomy    . Cholecystectomy    . Hemorrhoidectomy with hemorrhoid banding    . Cataract extraction w/ intraocular lens  implant, bilateral     Family History  Problem Relation Age of Onset  . Hypertension Other   . Diabetes Other   . Hypertension Mother   . Heart disease Father   . Pancreatic cancer Father   . Colon cancer Sister 15  . Diabetes Brother    Social History  Substance Use Topics  . Smoking status: Never Smoker   . Smokeless tobacco: Never Used  . Alcohol Use: No   OB History    No data available     Review of Systems Patient complains of painless swelling in the dorsum of her left hand.  She denies pain in her left index finger.  No fevers, chills, and sweats. Allergies  Avelox; Cephalexin;  Amantadine hcl; Cymbalta; Cyproheptadine hcl; Doxycycline hyclate; Effexor; Fluconazole; Fluticasone-salmeterol; Guaifenesin; Imipramine hcl; Latex; Levothyroxine sodium; Menthol; Metoclopramide hcl; Metronidazole; Montelukast sodium; Oxycodone-acetaminophen; Pirbuterol acetate; Remeron; Salmeterol xinafoate; Viibryd; Vilazodone hcl; Zolpidem tartrate; Azithromycin; Ciprofloxacin; Erythromycin base; Flagyl; Fluoxetine; Lansoprazole; Metoclopramide hcl; Moxifloxacin; Penicillins; Propulsid; Sulfadiazine; Sulfamethoxazole-trimethoprim; Telithromycin; Tetracycline hcl; and Topiramate  Home Medications   Prior to Admission medications   Medication Sig Start Date End Date Taking? Authorizing Provider  acetaminophen-codeine (TYLENOL #3) 300-30 MG per tablet Take 1 tablet by mouth every 4 (four) hours as needed. 11/30/11   Aleksei Plotnikov V, MD  Alum & Mag Hydroxide-Simeth (MAGIC MOUTHWASH) SOLN Take 5 mLs by mouth 4 (four) times daily. Swish, hold and swallow four times daily. 01/08/11   Aleksei Plotnikov V, MD  budesonide (PULMICORT) 0.25 MG/2ML nebulizer solution Take 2 mLs (0.25 mg total) by nebulization 3 (three) times daily. 01/09/13   Aleksei Plotnikov V, MD  Cholecalciferol 5000 UNITS capsule Take 5,000 Units by mouth daily.      Historical Provider, MD  CIPRODEX otic suspension Place 4 drops into the right ear 2 (two) times daily. 02/11/14   Aleksei Plotnikov V, MD  clonazePAM (KLONOPIN) 1 MG tablet  2 po qhs and 1/2 tab in addition in daytime prn 02/11/14   Aleksei Plotnikov V, MD  cyanocobalamin (,VITAMIN B-12,) 1000 MCG/ML injection Inject 1 mL (1,000 mcg total) into the muscle every 14 (fourteen) days. 12/06/14   Aleksei Plotnikov V, MD  cyclobenzaprine (FLEXERIL) 10 MG tablet Take 1 tablet (10 mg total) by mouth 3 (three) times daily as needed. 12/06/14   Aleksei Plotnikov V, MD  Docusate Calcium (STOOL SOFTENER PO) Take by mouth as needed.    Historical Provider, MD  ergocalciferol (VITAMIN D2)  50000 UNITS capsule Take 1 capsule (50,000 Units total) by mouth once a week. 12/07/14   Aleksei Plotnikov V, MD  escitalopram (LEXAPRO) 10 MG tablet Take 1 tablet by mouth daily. Taking 20mg  11/22/14   Historical Provider, MD  HYDROcodone-acetaminophen (NORCO/VICODIN) 5-325 MG per tablet  06/21/14   Historical Provider, MD  HYDROcodone-acetaminophen (NORCO/VICODIN) 5-325 MG tablet Take 1 tablet by mouth every 6 (six) hours as needed for moderate pain or severe pain. 03/05/15   Noland Fordyce, PA-C  Insulin Syringe-Needle U-100 (B-D INSULIN SYRINGE 1CC/26G) 26G X 1/2" 1 ML MISC Inject 1 each into the skin as directed. 02/11/14   Aleksei Plotnikov V, MD  Ipratropium-Albuterol (COMBIVENT RESPIMAT) 20-100 MCG/ACT AERS respimat Inhale 1 puff into the lungs every 6 (six) hours as needed for wheezing or shortness of breath. 12/06/14   Aleksei Plotnikov V, MD  levalbuterol (XOPENEX) 1.25 MG/3ML nebulizer solution Take 1.25 mg by nebulization every 4 (four) hours as needed for wheezing. 01/09/13   Aleksei Plotnikov V, MD  levofloxacin (LEVAQUIN) 500 MG tablet Take 1 tablet (500 mg total) by mouth daily. 01/10/15   Binnie Rail, MD  Omega-3 Fatty Acids (FISH OIL) 1000 MG CAPS Take 1 capsule by mouth 2 (two) times daily.      Historical Provider, MD  phenytoin (DILANTIN) 100 MG ER capsule Take 100 mg by mouth 3 (three) times daily. BMN    Historical Provider, MD  pravastatin (PRAVACHOL) 80 MG tablet TAKE 1 TABLET BY MOUTH EVERY DAY 02/01/14   Aleksei Plotnikov V, MD  promethazine (PHENERGAN) 25 MG tablet Take 0.5 tablets (12.5 mg total) by mouth every 6 (six) hours as needed. 11/30/11   Aleksei Plotnikov V, MD  saccharomyces boulardii (FLORASTOR) 250 MG capsule Take 1 capsule (250 mg total) by mouth 2 (two) times daily. 07/10/14   Aleksei Plotnikov V, MD  sodium chloride (MURO 128) 5 % ophthalmic ointment Place 1 application into the right eye 4 (four) times daily.    Historical Provider, MD  traZODone (DESYREL) 50 MG  tablet TAKE 4 TABLETS BY MOUTH AT BEDTIME 12/12/13   Aleksei Plotnikov V, MD  verapamil (CALAN-SR) 240 MG CR tablet Take 1 tablet by mouth at bedtime and may repeat dose one time if needed. 05/12/14   Historical Provider, MD   Meds Ordered and Administered this Visit  Medications - No data to display  BP 146/71 mmHg  Pulse 76  Temp(Src) 98 F (36.7 C) (Oral)  SpO2 98% No data found.   Physical Exam Patient appears comfortable and in no distress. Left hand, after removing CoBan wrap, has mild edema of dorsum without erythema or tenderness to palpation.  Dorsum is cool to touch.  Distal neurovascular function is intact.  Laceration left index finger has no erythema or drainage.  Index finger has full range of motion. ED Course  Procedures none  MDM   1. Dressing change or removal, nonsurgical wound    There is  no evidence of bacterial infection today.   Note that patient had been wearing her wrap too tightly on wrist, resulting in mild edema of hand. Bacitracin and bandage applied to wound, and finger re-splinted using very lightly applied Coban wrap.  Advised to change dressing daily and apply Bacitracin ointment to wound.  Keep wound clean and dry.  Return for any signs of infection (or follow-up with family doctor):  Increasing redness, swelling, pain, heat, drainage, etc.  Elevate hand.  May apply heating pad as needed.  Ensure that wraps are not too tight on hand/wrist. Return for suture removal as previously discussed.    Kandra Nicolas, MD 03/07/15 1030

## 2015-03-07 NOTE — ED Notes (Signed)
Pt  Here today for dressing change and f/u on wound from 12/21.

## 2015-03-07 NOTE — Discharge Instructions (Signed)
Change dressing daily and apply Bacitracin ointment to wound.  Keep wound clean and dry.  Return for any signs of infection (or follow-up with family doctor):  Increasing redness, swelling, pain, heat, drainage, etc.  Elevate hand.  May apply heating pad as needed.  Ensure that wraps are not too tight on hand/wrist.

## 2015-03-14 ENCOUNTER — Emergency Department (INDEPENDENT_AMBULATORY_CARE_PROVIDER_SITE_OTHER)
Admission: EM | Admit: 2015-03-14 | Discharge: 2015-03-14 | Disposition: A | Discharge status: Home / Self Care | Payer: Medicare Other | Source: Home / Self Care | Attending: Family Medicine | Admitting: Family Medicine

## 2015-03-14 DIAGNOSIS — Z4802 Encounter for removal of sutures: Secondary | ICD-10-CM

## 2015-03-14 NOTE — ED Notes (Signed)
Pt is here for suture removal from index finger. Reports 2 sites that bleed from time to time. No s/s of infection.

## 2015-03-14 NOTE — Discharge Instructions (Signed)
Suture Removal, Care After Refer to this sheet in the next few weeks. These instructions provide you with information on caring for yourself after your procedure. Your health care provider may also give you more specific instructions. Your treatment has been planned according to current medical practices, but problems sometimes occur. Call your health care provider if you have any problems or questions after your procedure. WHAT TO EXPECT AFTER THE PROCEDURE After your stitches (sutures) are removed, it is typical to have the following:  Some discomfort and swelling in the wound area.  Slight redness in the area. HOME CARE INSTRUCTIONS   If you have skin adhesive strips over the wound area, do not take the strips off. They will fall off on their own in a few days. If the strips remain in place after 14 days, you may remove them.  Change any bandages (dressings) at least once a day or as directed by your health care provider. If the bandage sticks, soak it off with warm, soapy water.  Gently wash with soap and water.  Do not apply anymore ointment as this will cause the steri-strips to fall off too soon.  Do not soak your finger as this will also cause the steri strips to come off too soon.  Keep the wound area dry and clean. If the bandage becomes wet or dirty, or if it develops a bad smell, change it as soon as possible.  Continue to protect the wound from injury.  Use sunscreen when out in the sun. New scars become sunburned easily. SEEK MEDICAL CARE IF:  You have increasing redness, swelling, or pain in the wound.  You see pus coming from the wound.  You have a fever.  You notice a bad smell coming from the wound or dressing.  Your wound breaks open (edges not staying together).   This information is not intended to replace advice given to you by your health care provider. Make sure you discuss any questions you have with your health care provider.   Document Released:  11/24/2000 Document Revised: 12/20/2012 Document Reviewed: 10/11/2012 Elsevier Interactive Patient Education [employer].

## 2015-03-14 NOTE — ED Provider Notes (Signed)
CSN: [id number]     Arrival date & time 03/14/15  0831 History   First MD Initiated Contact with Patient 03/14/15 [phone]     Chief Complaint  Patient presents with  . Suture / Staple Removal   (Consider location/radiation/quality/duration/timing/severity/associated sxs/prior Treatment) HPI  Pt is a pleasant 64yo female presenting to [facility] for wound check and suture removal.  Pt was seen at [facility] on 03/05/15 for laceration of Left index finger, 3 sutures were placed. Pt was seen again 03/07/15 for bandage change and wound recheck.  Pt notes the wound still bleeds some when she bends her finger and there is mild joint soreness and stiffness. Pt also reports mild bruising to the finger and hand.  No other injuries. Denies fever, chills, redness or warmth to finger, or drainage of pus.     Past Medical History  Diagnosis Date  . Diverticulosis of colon 02/17/06  . GERD (gastroesophageal reflux disease)   . Hyperlipidemia   . Hypertension   . Seizure disorder ([place])   . Asthma   . LBP (low back pain)   . OA (osteoarthritis)   . Vitamin B 12 deficiency   . Vitamin D deficiency   . Sjogren's disease ([place]) 2010    per Dr. Owens Shark, [place]  . Migraine headache   . Gastric ulcer   . AVM (arteriovenous malformation)   . CVA (cerebral infarction)     following brain surgery  . Depression   . Seizure disorder ([place])   . Colon polyp 01/04/91    hyperplastic  . Endometriosis   . COPD (chronic obstructive pulmonary disease) ([place])   . Seizures [facility])    Past Surgical History  Procedure Laterality Date  . Mouth surgery    . Nissen fundoplication  [id number]  . Brain surgery  1991  . Temporomandibular joint surgery  02/2001  . Laparoscopic ovarian cystectomy    . Cholecystectomy    . Hemorrhoidectomy with hemorrhoid banding    . Cataract extraction w/ intraocular lens  implant, bilateral     Family History  Problem Relation Age of Onset  . Hypertension Other   . Diabetes Other   . Hypertension  Mother   . Heart disease Father   . Pancreatic cancer Father   . Colon cancer Sister 37  . Diabetes Brother    Social History  Substance Use Topics  . Smoking status: Never Smoker   . Smokeless tobacco: Never Used  . Alcohol Use: No   OB History    No data available     Review of Systems  Constitutional: Negative for fever and chills.  Musculoskeletal: Positive for arthralgias. Negative for myalgias and joint swelling.  Skin: Positive for color change and wound. Negative for rash.    Allergies  Avelox; Cephalexin; Amantadine hcl; Cymbalta; Cyproheptadine hcl; Doxycycline hyclate; Effexor; Fluconazole; Fluticasone-salmeterol; Guaifenesin; Imipramine hcl; Latex; Levothyroxine sodium; Menthol; Metoclopramide hcl; Metronidazole; Montelukast sodium; Oxycodone-acetaminophen; Pirbuterol acetate; Remeron; Salmeterol xinafoate; Viibryd; Vilazodone hcl; Zolpidem tartrate; Azithromycin; Ciprofloxacin; Erythromycin base; Flagyl; Fluoxetine; Lansoprazole; Metoclopramide hcl; Moxifloxacin; Penicillins; Propulsid; Sulfadiazine; Sulfamethoxazole-trimethoprim; Telithromycin; Tetracycline hcl; and Topiramate  Home Medications   Prior to Admission medications   Medication Sig Start Date End Date Taking? Authorizing Provider  acetaminophen-codeine (TYLENOL #3) 300-30 MG per tablet Take 1 tablet by mouth every 4 (four) hours as needed. 11/30/11   Aleksei Plotnikov V, MD  Alum & Mag Hydroxide-Simeth (MAGIC MOUTHWASH) SOLN Take 5 mLs by mouth 4 (four) times daily. Swish, hold and swallow four times daily.  01/08/11   Aleksei Plotnikov V, MD  budesonide (PULMICORT) 0.25 MG/2ML nebulizer solution Take 2 mLs (0.25 mg total) by nebulization 3 (three) times daily. 01/09/13   Aleksei Plotnikov V, MD  Cholecalciferol 5000 UNITS capsule Take 5,000 Units by mouth daily.      Historical Provider, MD  CIPRODEX otic suspension Place 4 drops into the right ear 2 (two) times daily. 02/11/14   Aleksei Plotnikov V, MD   clonazePAM (KLONOPIN) 1 MG tablet 2 po qhs and 1/2 tab in addition in daytime prn 02/11/14   Aleksei Plotnikov V, MD  cyanocobalamin (,VITAMIN B-12,) 1000 MCG/ML injection Inject 1 mL (1,000 mcg total) into the muscle every 14 (fourteen) days. 12/06/14   Aleksei Plotnikov V, MD  cyclobenzaprine (FLEXERIL) 10 MG tablet Take 1 tablet (10 mg total) by mouth 3 (three) times daily as needed. 12/06/14   Aleksei Plotnikov V, MD  Docusate Calcium (STOOL SOFTENER PO) Take by mouth as needed.    Historical Provider, MD  ergocalciferol (VITAMIN D2) 50000 UNITS capsule Take 1 capsule (50,000 Units total) by mouth once a week. 12/07/14   Aleksei Plotnikov V, MD  escitalopram (LEXAPRO) 10 MG tablet Take 1 tablet by mouth daily. Taking 20mg  11/22/14   Historical Provider, MD  HYDROcodone-acetaminophen (NORCO/VICODIN) 5-325 MG per tablet  06/21/14   Historical Provider, MD  HYDROcodone-acetaminophen (NORCO/VICODIN) 5-325 MG tablet Take 1 tablet by mouth every 6 (six) hours as needed for moderate pain or severe pain. 03/05/15   Noland Fordyce, PA-C  Insulin Syringe-Needle U-100 (B-D INSULIN SYRINGE 1CC/26G) 26G X 1/2" 1 ML MISC Inject 1 each into the skin as directed. 02/11/14   Aleksei Plotnikov V, MD  Ipratropium-Albuterol (COMBIVENT RESPIMAT) 20-100 MCG/ACT AERS respimat Inhale 1 puff into the lungs every 6 (six) hours as needed for wheezing or shortness of breath. 12/06/14   Aleksei Plotnikov V, MD  levalbuterol (XOPENEX) 1.25 MG/3ML nebulizer solution Take 1.25 mg by nebulization every 4 (four) hours as needed for wheezing. 01/09/13   Aleksei Plotnikov V, MD  levofloxacin (LEVAQUIN) 500 MG tablet Take 1 tablet (500 mg total) by mouth daily. 01/10/15   Binnie Rail, MD  Omega-3 Fatty Acids (FISH OIL) 1000 MG CAPS Take 1 capsule by mouth 2 (two) times daily.      Historical Provider, MD  phenytoin (DILANTIN) 100 MG ER capsule Take 100 mg by mouth 3 (three) times daily. BMN    Historical Provider, MD  pravastatin  (PRAVACHOL) 80 MG tablet TAKE 1 TABLET BY MOUTH EVERY DAY 02/01/14   Aleksei Plotnikov V, MD  promethazine (PHENERGAN) 25 MG tablet Take 0.5 tablets (12.5 mg total) by mouth every 6 (six) hours as needed. 11/30/11   Aleksei Plotnikov V, MD  saccharomyces boulardii (FLORASTOR) 250 MG capsule Take 1 capsule (250 mg total) by mouth 2 (two) times daily. 07/10/14   Aleksei Plotnikov V, MD  sodium chloride (MURO 128) 5 % ophthalmic ointment Place 1 application into the right eye 4 (four) times daily.    Historical Provider, MD  traZODone (DESYREL) 50 MG tablet TAKE 4 TABLETS BY MOUTH AT BEDTIME 12/12/13   Aleksei Plotnikov V, MD  verapamil (CALAN-SR) 240 MG CR tablet Take 1 tablet by mouth at bedtime and may repeat dose one time if needed. 05/12/14   Historical Provider, MD   Meds Ordered and Administered this Visit  Medications - No data to display  BP 145/76 mmHg  Pulse 76  Temp(Src) 97.9 F (36.6 C) (Oral) No data found.  Physical Exam  Constitutional: She is oriented to person, place, and time. She appears well-developed and well-nourished.  HENT:  Head: Normocephalic and atraumatic.  Eyes: EOM are normal.  Neck: Normal range of motion.  Cardiovascular: Normal rate.   Left index finger: cap refill < 3 seconds  Pulmonary/Chest: Effort normal.  Musculoskeletal:  Left index finger: no edema. Slight decreased flexion at MCP and PIP. Full extension.    Neurological: She is alert and oriented to person, place, and time.  Left index finger: normal sensation.  Skin: Skin is warm and dry.  Left index finger: laceration healing well, 3 sutures in place. Scant red blood. No discharge. No warmth or erythema.  Psychiatric: She has a normal mood and affect. Her behavior is normal.  Nursing note and vitals reviewed.   ED Course  .Suture Removal Date/Time: 03/14/2015 9:12 AM Performed by: Noland Fordyce Authorized by: Theone Murdoch A Consent: Verbal consent obtained. Risks and benefits: risks,  benefits and alternatives were discussed Consent given by: patient Patient understanding: patient states understanding of the procedure being performed Patient consent: the patient's understanding of the procedure matches consent given Site marked: the operative site was marked Required items: required blood products, implants, devices, and special equipment available Patient identity confirmed: verbally with patient Body area: upper extremity Location details: left index finger Wound Appearance: clean, moist and pink (scant red blood) Sutures Removed: 3 Post-removal: dressing applied and Steri-Strips applied Facility: sutures placed in this facility Patient tolerance: Patient tolerated the procedure well with no immediate complications   (including critical care time)  Labs Review Labs Reviewed - No data to display  Imaging Review No results found.     MDM   1. Visit for suture removal     Pt presenting to [facility] for suture removal of sutures placed at [facility] on 03/05/15.  Mild ecchymosis of finger and Left hand as well as scant red blood at site of sutures.  Sutures removed without immediate complication. Wound edges still not completely healed.  Two steri strips with wound glue applied.   Encouraged pt to no longer use finger splint. Wound bandaged at proximal phalanx to allow movement of MCP and PIP as finger was starting to become stiff. Home care instructions provided. Return precautions provided. Advised pt no need to follow up if steri strips fall off and wound appears to have healed well. May return if signs of infection- redness, swelling, pus, worsening pain or worsening stiffness of finger. Patient verbalized understanding and agreement with treatment plan.     Noland Fordyce, PA-C 03/14/15 [phone]

## 2015-04-29 ENCOUNTER — Encounter: Payer: Self-pay | Admitting: Internal Medicine

## 2015-04-29 ENCOUNTER — Ambulatory Visit (INDEPENDENT_AMBULATORY_CARE_PROVIDER_SITE_OTHER): Payer: Medicare Other | Admitting: Internal Medicine

## 2015-04-29 ENCOUNTER — Other Ambulatory Visit (INDEPENDENT_AMBULATORY_CARE_PROVIDER_SITE_OTHER): Payer: Medicare Other

## 2015-04-29 VITALS — BP 148/80 | HR 66 | Wt 102.0 lb

## 2015-04-29 DIAGNOSIS — J45909 Unspecified asthma, uncomplicated: Secondary | ICD-10-CM

## 2015-04-29 DIAGNOSIS — E559 Vitamin D deficiency, unspecified: Secondary | ICD-10-CM | POA: Diagnosis not present

## 2015-04-29 DIAGNOSIS — E041 Nontoxic single thyroid nodule: Secondary | ICD-10-CM

## 2015-04-29 DIAGNOSIS — E875 Hyperkalemia: Secondary | ICD-10-CM

## 2015-04-29 DIAGNOSIS — S060X1S Concussion with loss of consciousness of 30 minutes or less, sequela: Secondary | ICD-10-CM

## 2015-04-29 DIAGNOSIS — L509 Urticaria, unspecified: Secondary | ICD-10-CM | POA: Diagnosis not present

## 2015-04-29 DIAGNOSIS — E538 Deficiency of other specified B group vitamins: Secondary | ICD-10-CM

## 2015-04-29 DIAGNOSIS — J449 Chronic obstructive pulmonary disease, unspecified: Secondary | ICD-10-CM | POA: Diagnosis not present

## 2015-04-29 DIAGNOSIS — IMO0001 Reserved for inherently not codable concepts without codable children: Secondary | ICD-10-CM

## 2015-04-29 DIAGNOSIS — E539 Vitamin B deficiency, unspecified: Secondary | ICD-10-CM

## 2015-04-29 DIAGNOSIS — S060X9A Concussion with loss of consciousness of unspecified duration, initial encounter: Secondary | ICD-10-CM | POA: Insufficient documentation

## 2015-04-29 LAB — BASIC METABOLIC PANEL
BUN: 9 mg/dL (ref 6–23)
CHLORIDE: 100 meq/L (ref 96–112)
CO2: 33 meq/L — AB (ref 19–32)
CREATININE: 0.61 mg/dL (ref 0.40–1.20)
Calcium: 9.5 mg/dL (ref 8.4–10.5)
GFR: 104.27 mL/min (ref >60.00)
Glucose, Bld: 90 mg/dL (ref 70–99)
POTASSIUM: 4.8 meq/L (ref 3.5–5.1)
SODIUM: 139 meq/L (ref 135–145)

## 2015-04-29 LAB — TSH: TSH: 1.47 u[IU]/mL (ref 0.35–4.50)

## 2015-04-29 LAB — VITAMIN D 25 HYDROXY (VIT D DEFICIENCY, FRACTURES): VITD: 18.13 ng/mL — AB (ref 30.00–100.00)

## 2015-04-29 MED ORDER — PROMETHAZINE-CODEINE 6.25-10 MG/5ML PO SYRP: 5.0000 mL | ORAL_SOLUTION | Freq: Four times a day (QID) | ORAL | Status: DC | PRN

## 2015-04-29 MED ORDER — ACETAMINOPHEN-CODEINE #3 300-30 MG PO TABS
1.0000 | ORAL_TABLET | ORAL | Status: DC | PRN
Start: 2015-04-29 — End: 2015-07-23

## 2015-04-29 NOTE — Assessment & Plan Note (Signed)
Labs On vit D

## 2015-04-29 NOTE — Assessment & Plan Note (Signed)
2/17 Neck CT showed some nodules on the thyroid - needs [place]

## 2015-04-29 NOTE — Progress Notes (Signed)
Pre visit review using our clinic review tool, if applicable. No additional management support is needed unless otherwise documented below in the visit note. 

## 2015-04-29 NOTE — Progress Notes (Signed)
Subjective:  Patient ID: Veronica [name], female    DOB: 04-09-1950  Age: 65 y.o. MRN: [id number]  CC: No chief complaint on file.   HPI Veronica [name] presents for a fall on 03/14/15 - hit the back of her head - LOC. Neck CT showed some nodules on the thyroid - needs [place] F/u depression,  COPD, B12 def   Outpatient Prescriptions Prior to Visit  Medication Sig Dispense Refill  . acetaminophen-codeine (TYLENOL #3) 300-30 MG per tablet Take 1 tablet by mouth every 4 (four) hours as needed. 60 tablet 3  . Alum & Mag Hydroxide-Simeth (MAGIC MOUTHWASH) SOLN Take 5 mLs by mouth 4 (four) times daily. Swish, hold and swallow four times daily. 300 mL 3  . budesonide (PULMICORT) 0.25 MG/2ML nebulizer solution Take 2 mLs (0.25 mg total) by nebulization 3 (three) times daily. 180 mL 3  . CIPRODEX otic suspension Place 4 drops into the right ear 2 (two) times daily. 7.5 mL 0  . clonazePAM (KLONOPIN) 1 MG tablet 2 po qhs and 1/2 tab in addition in daytime prn 225 tablet 1  . cyanocobalamin (,VITAMIN B-12,) 1000 MCG/ML injection Inject 1 mL (1,000 mcg total) into the muscle every 14 (fourteen) days. 6 mL 11  . cyclobenzaprine (FLEXERIL) 10 MG tablet Take 1 tablet (10 mg total) by mouth 3 (three) times daily as needed. 90 tablet 3  . Docusate Calcium (STOOL SOFTENER PO) Take by mouth as needed.    [name] HYDROcodone-acetaminophen (NORCO/VICODIN) 5-325 MG per tablet   0  . HYDROcodone-acetaminophen (NORCO/VICODIN) 5-325 MG tablet Take 1 tablet by mouth every 6 (six) hours as needed for moderate pain or severe pain. 6 tablet 0  . Insulin Syringe-Needle U-100 (B-D INSULIN SYRINGE 1CC/26G) 26G X 1/2" 1 ML MISC Inject 1 each into the skin as directed. 100 each 5  . Ipratropium-Albuterol (COMBIVENT RESPIMAT) 20-100 MCG/ACT AERS respimat Inhale 1 puff into the lungs every 6 (six) hours as needed for wheezing or shortness of breath. 1 Inhaler 11  . levalbuterol (XOPENEX) 1.25 MG/3ML nebulizer solution Take 1.25 mg by  nebulization every 4 (four) hours as needed for wheezing. 288 mL 3  . levofloxacin (LEVAQUIN) 500 MG tablet Take 1 tablet (500 mg total) by mouth daily. 10 tablet 0  . Omega-3 Fatty Acids (FISH OIL) 1000 MG CAPS Take 1 capsule by mouth 2 (two) times daily.      . phenytoin (DILANTIN) 100 MG ER capsule Take 100 mg by mouth 3 (three) times daily. BMN    . pravastatin (PRAVACHOL) 80 MG tablet TAKE 1 TABLET BY MOUTH EVERY DAY 90 tablet 3  . promethazine (PHENERGAN) 25 MG tablet Take 0.5 tablets (12.5 mg total) by mouth every 6 (six) hours as needed. 60 tablet 1  . saccharomyces boulardii (FLORASTOR) 250 MG capsule Take 1 capsule (250 mg total) by mouth 2 (two) times daily. 60 capsule 1  . sodium chloride (MURO 128) 5 % ophthalmic ointment Place 1 application into the right eye 4 (four) times daily.    . traZODone (DESYREL) 50 MG tablet TAKE 4 TABLETS BY MOUTH AT BEDTIME 360 tablet 3  . verapamil (CALAN-SR) 240 MG CR tablet Take 1 tablet by mouth at bedtime and may repeat dose one time if needed.  5  . Cholecalciferol 5000 UNITS capsule Take 5,000 Units by mouth daily.      . ergocalciferol (VITAMIN D2) 50000 UNITS capsule Take 1 capsule (50,000 Units total) by mouth once a week. 6 capsule  0  . escitalopram (LEXAPRO) 10 MG tablet Take 1 tablet by mouth daily. Taking 20mg   2   No facility-administered medications prior to visit.    ROS Review of Systems  Constitutional: Positive for fatigue. Negative for chills, activity change, appetite change and unexpected weight change.  HENT: Negative for congestion, mouth sores and sinus pressure.   Eyes: Negative for visual disturbance.  Respiratory: Negative for cough and chest tightness.   Gastrointestinal: Negative for nausea and abdominal pain.  Genitourinary: Negative for frequency, difficulty urinating and vaginal pain.  Musculoskeletal: Positive for arthralgias and gait problem. Negative for back pain.  Skin: Negative for pallor and rash.    Neurological: Positive for headaches. Negative for dizziness, tremors, weakness and numbness.  Psychiatric/Behavioral: Negative for confusion and sleep disturbance. The patient is nervous/anxious.     Objective:  BP 148/80 mmHg  Pulse 66  Wt 102 lb (46.267 kg)  SpO2 99%  BP Readings from Last 3 Encounters:  04/29/15 148/80  03/14/15 145/76  03/07/15 146/71    Wt Readings from Last 3 Encounters:  04/29/15 102 lb (46.267 kg)  01/10/15 93 lb 4 oz (42.298 kg)  12/06/14 98 lb (44.453 kg)    Physical Exam  Constitutional: She appears well-developed. No distress.  HENT:  Head: Normocephalic.  Right Ear: External ear normal.  Left Ear: External ear normal.  Nose: Nose normal.  Mouth/Throat: Oropharynx is clear and moist.  Eyes: Conjunctivae are normal. Pupils are equal, round, and reactive to light. Right eye exhibits no discharge. Left eye exhibits no discharge.  Neck: Normal range of motion. Neck supple. No JVD present. No tracheal deviation present. No thyromegaly present.  Cardiovascular: Normal rate, regular rhythm and normal heart sounds.   Pulmonary/Chest: No stridor. No respiratory distress. She has no wheezes.  Abdominal: Soft. Bowel sounds are normal. She exhibits no distension and no mass. There is no tenderness. There is no rebound and no guarding.  Musculoskeletal: She exhibits tenderness. She exhibits no edema.  Lymphadenopathy:    She has no cervical adenopathy.  Neurological: She displays normal reflexes. No cranial nerve deficit. She exhibits normal muscle tone. Coordination abnormal.  Skin: No rash noted. No erythema.  Psychiatric: She has a normal mood and affect. Her behavior is normal. Judgment and thought content normal.  cane  Lab Results  Component Value Date   WBC 8.4 11/13/2013   HGB 14.5 11/13/2013   HCT 43.7 11/13/2013   PLT 324.0 11/13/2013   GLUCOSE 85 12/06/2014   CHOL 242* 12/06/2014   TRIG 98.0 12/06/2014   HDL 69.60 12/06/2014    LDLDIRECT 165.0 09/26/2009   LDLCALC 153* 12/06/2014   ALT 8 12/06/2014   AST 14 12/06/2014   NA 140 12/06/2014   K 4.0 12/06/2014   CL 102 12/06/2014   CREATININE 0.66 12/06/2014   BUN 8 12/06/2014   CO2 28 12/06/2014   TSH 1.25 12/06/2014   INR 1.0 ratio 11/07/2009   [facility] CT reports reviewed    Assessment & Plan:   Diagnoses and all orders for this visit:  URTICARIA -     acetaminophen-codeine (TYLENOL #3) 300-30 MG tablet; Take 1 tablet by mouth every 4 (four) hours as needed.  Chronic obstructive airway disease with asthma (HCC) -     acetaminophen-codeine (TYLENOL #3) 300-30 MG tablet; Take 1 tablet by mouth every 4 (four) hours as needed.  HYPERKALEMIA -     acetaminophen-codeine (TYLENOL #3) 300-30 MG tablet; Take 1 tablet by mouth every 4 (four)  hours as needed.  Vitamin B-complex deficiency -     acetaminophen-codeine (TYLENOL #3) 300-30 MG tablet; Take 1 tablet by mouth every 4 (four) hours as needed.  Vitamin D deficiency -     acetaminophen-codeine (TYLENOL #3) 300-30 MG tablet; Take 1 tablet by mouth every 4 (four) hours as needed.  Other orders -     promethazine-codeine (PHENERGAN WITH CODEINE) 6.25-10 MG/5ML syrup;    I have discontinued Ms. Leinberger's ergocalciferol. I am also having her maintain her phenytoin, Fish Oil, magic mouthwash, acetaminophen-codeine, promethazine, budesonide, levalbuterol, sodium chloride, traZODone, Docusate Calcium (STOOL SOFTENER PO), pravastatin, CIPRODEX, clonazePAM, Insulin Syringe-Needle U-100, verapamil, HYDROcodone-acetaminophen, saccharomyces boulardii, Ipratropium-Albuterol, cyanocobalamin, cyclobenzaprine, levofloxacin, HYDROcodone-acetaminophen, escitalopram, and Cholecalciferol.  Meds ordered this encounter  Medications  . escitalopram (LEXAPRO) 20 MG tablet    Sig: Take 1 tablet by mouth daily.    Refill:  3  . Cholecalciferol 1000 units tablet    Sig: Take 1,000 Units by mouth daily.     Follow-up: Return  in about 3 months (around 07/27/2015) for a follow-up visit.  Walker Kehr, MD

## 2015-04-29 NOTE — Assessment & Plan Note (Signed)
On B12 

## 2015-04-29 NOTE — Assessment & Plan Note (Signed)
Discussed post-concussion recovery

## 2015-04-29 NOTE — Assessment & Plan Note (Signed)
Chronic Pulmicort, Combivent Prom-cod syr 

## 2015-05-01 ENCOUNTER — Telehealth: Payer: Self-pay

## 2015-05-01 ENCOUNTER — Telehealth: Payer: Self-pay | Admitting: Internal Medicine

## 2015-05-01 NOTE — Telephone Encounter (Signed)
04/29/15 lab results mailed to patient

## 2015-05-01 NOTE — Telephone Encounter (Signed)
Requesting call back with lab results

## 2015-05-02 NOTE — Telephone Encounter (Signed)
Notes Recorded by Ander Slade, RN on 05/01/2015 at 4:15 PM Patient is taking 1000 iu daily-----please advise, i will call patient back--thanks Notes Recorded by Cassandria Anger, MD on 04/29/2015 at 11:13 PM [name], please, inform patient that all labs are normal except for low vit D - Is she taking Vit D? How much? Thx

## 2015-05-09 ENCOUNTER — Ambulatory Visit
Admission: RE | Admit: 2015-05-09 | Discharge: 2015-05-09 | Disposition: A | Discharge status: Home / Self Care | Payer: Medicare Other | Source: Ambulatory Visit | Attending: Internal Medicine | Admitting: Internal Medicine

## 2015-05-13 ENCOUNTER — Other Ambulatory Visit: Payer: Self-pay | Admitting: Internal Medicine

## 2015-05-13 DIAGNOSIS — E041 Nontoxic single thyroid nodule: Secondary | ICD-10-CM

## 2015-05-20 ENCOUNTER — Other Ambulatory Visit (HOSPITAL_COMMUNITY)
Admission: RE | Admit: 2015-05-20 | Discharge: 2015-05-20 | Disposition: A | Discharge status: Home / Self Care | Payer: Medicare Other | Source: Ambulatory Visit | Attending: Physician Assistant | Admitting: Physician Assistant

## 2015-05-20 ENCOUNTER — Ambulatory Visit
Admission: RE | Admit: 2015-05-20 | Discharge: 2015-05-20 | Disposition: A | Discharge status: Home / Self Care | Payer: Medicare Other | Source: Ambulatory Visit | Attending: Internal Medicine | Admitting: Internal Medicine

## 2015-05-20 DIAGNOSIS — E041 Nontoxic single thyroid nodule: Secondary | ICD-10-CM

## 2015-05-20 NOTE — Procedures (Signed)
Using direct ultrasound guidance, 4 passes were made using needles into the nodule within the left lobe of the thyroid.   Ultrasound was used to confirm needle placements on all occasions.   Specimens were sent to Pathology for analysis.   [facility] Keshan Reha PA-C

## 2015-05-27 ENCOUNTER — Telehealth: Payer: Self-pay | Admitting: Internal Medicine

## 2015-05-27 DIAGNOSIS — E041 Nontoxic single thyroid nodule: Secondary | ICD-10-CM

## 2015-05-27 NOTE — Telephone Encounter (Signed)
Found lab with inconclusive results for thyroid nodule---dr plotnikov has given verbal order to place referral to Dr Cammy Copa to follow up with patient---referral placed and patient advised

## 2015-05-27 NOTE — Telephone Encounter (Signed)
Pt called request the result of thyroid needle biopsy that was done 05/20/15. Please call pt ASAP today if possible

## 2015-06-11 ENCOUNTER — Ambulatory Visit (INDEPENDENT_AMBULATORY_CARE_PROVIDER_SITE_OTHER): Payer: Medicare Other | Admitting: Endocrinology

## 2015-06-11 ENCOUNTER — Encounter: Payer: Self-pay | Admitting: Endocrinology

## 2015-06-11 VITALS — BP 128/82 | HR 72 | Temp 98.0°F | Resp 14 | Ht 62.0 in | Wt 105.0 lb

## 2015-06-11 DIAGNOSIS — E041 Nontoxic single thyroid nodule: Secondary | ICD-10-CM

## 2015-06-11 NOTE — Progress Notes (Signed)
Patient ID: Veronica [name], female   DOB: 01/27/1951, 65 y.o.   MRN: [id number]           Reason for Appointment: Evaluation of thyroid nodule    History of Present Illness:   The patient's thyroid nodule was first discovered incidentally when she had a CT scan of her cervical spine on 03/15/15   Subsequently had an ultrasound to evaluate this further in 2/17  The thyroid ultrasound showed the following: 1.9 centimeter left anterior complex nodule, mostly cystic along with a few subcentimeter nodules.  Her needle aspiration biopsy done in 3/17 showed scant epithelium, mostly Hurthle cells in the nodule was characterized as follicular lesion of uncertain significance She is now here for further management  She has not been told to have a palpable thyroid nodule. She has had some difficulty swallowing for several months but also has a history of CVA which had affected her swallowing       Medication List       This list is accurate as of: 06/11/15  1:01 PM.  Always use your most recent med list.               acetaminophen-codeine 300-30 MG tablet  Commonly known as:  TYLENOL #3  Take 1 tablet by mouth every 4 (four) hours as needed.     Cholecalciferol 1000 units tablet  Take 1,000 Units by mouth daily.     CIPRODEX otic suspension  Generic drug:  ciprofloxacin-dexamethasone  Place 4 drops into the right ear 2 (two) times daily.     clonazePAM 1 MG tablet  Commonly known as:  KLONOPIN  2 po qhs and 1/2 tab in addition in daytime prn     cyanocobalamin 1000 MCG/ML injection  Commonly known as:  (VITAMIN B-12)  Inject 1 mL (1,000 mcg total) into the muscle every 14 (fourteen) days.     cyclobenzaprine 10 MG tablet  Commonly known as:  FLEXERIL  Take 1 tablet (10 mg total) by mouth 3 (three) times daily as needed.     escitalopram 20 MG tablet  Commonly known as:  LEXAPRO  Take 1 tablet by mouth daily.     Fish Oil 1000 MG Caps  Take 1 capsule by mouth 2 (two)  times daily.     HYDROcodone-acetaminophen 5-325 MG tablet  Commonly known as:  NORCO/VICODIN  Reported on 06/11/2015     HYDROcodone-acetaminophen 5-325 MG tablet  Commonly known as:  NORCO/VICODIN  Take 1 tablet by mouth every 6 (six) hours as needed for moderate pain or severe pain.     Insulin Syringe-Needle U-100 26G X 1/2" 1 ML Misc  Commonly known as:  B-D INSULIN SYRINGE 1CC/26G  Inject 1 each into the skin as directed.     Ipratropium-Albuterol 20-100 MCG/ACT Aers respimat  Commonly known as:  COMBIVENT RESPIMAT  Inhale 1 puff into the lungs every 6 (six) hours as needed for wheezing or shortness of breath.     levofloxacin 500 MG tablet  Commonly known as:  LEVAQUIN  Take 1 tablet (500 mg total) by mouth daily.     magic mouthwash Soln  Take 5 mLs by mouth 4 (four) times daily. Swish, hold and swallow four times daily.     phenytoin 100 MG ER capsule  Commonly known as:  DILANTIN  Take 100 mg by mouth 3 (three) times daily. BMN     pravastatin 80 MG tablet  Commonly known as:  PRAVACHOL  TAKE 1  TABLET BY MOUTH EVERY DAY     promethazine 25 MG tablet  Commonly known as:  PHENERGAN  Take 0.5 tablets (12.5 mg total) by mouth every 6 (six) hours as needed.     promethazine-codeine 6.25-10 MG/5ML syrup  Commonly known as:  PHENERGAN with CODEINE  Take 5 mLs by mouth every 6 (six) hours as needed for cough.     sodium chloride 5 % ophthalmic ointment  Commonly known as:  MURO 0000000  Place 1 application into the right eye 4 (four) times daily.     STOOL SOFTENER PO  Take by mouth as needed.     traZODone 50 MG tablet  Commonly known as:  DESYREL  TAKE 4 TABLETS BY MOUTH AT BEDTIME     verapamil 240 MG CR tablet  Commonly known as:  CALAN-SR  Take 1 tablet by mouth at bedtime and may repeat dose one time if needed.        Allergies:  Allergies  Allergen Reactions  . Avelox [Moxifloxacin Hcl In Nacl] Swelling, Rash and Anaphylaxis  . Cephalexin Shortness  Of Breath and Itching  . Amantadine Hcl     Per pt: unknown  . Cymbalta [Duloxetine Hcl]     Dizzy, nausea  . Cyproheptadine Hcl   . Doxycycline Hyclate     High blood pressure  . Effexor [Venlafaxine Hydrochloride]     elev BP, dizzy  . Fluconazole   . Fluticasone-Salmeterol   . Guaifenesin   . Imipramine Hcl     Per pt: unknown  . Latex Itching    dermatitis  . Levothyroxine Sodium   . Menthol Itching  . Metoclopramide Hcl   . Metronidazole   . Montelukast Sodium     Per pt: unknown  . Oxycodone-Acetaminophen Itching  . Pirbuterol Acetate   . Remeron [Mirtazapine]     nightmares  . Salmeterol Xinafoate     Per pt: unknown  . Viibryd [Vilazodone Hcl]     shaky  . Vilazodone Hcl Itching    n/v  . Zolpidem Tartrate     REACTION: not effective  . Azithromycin Itching and Rash  . Ciprofloxacin Itching and Rash  . Erythromycin Base Itching and Rash  . Flagyl [Metronidazole Hcl] Itching and Rash  . Fluoxetine Rash    rash  . Lansoprazole Itching and Rash  . Metoclopramide Hcl Itching and Rash  . Moxifloxacin Itching and Rash  . Penicillins Itching and Rash  . Propulsid [Cisapride] Itching and Rash  . Sulfadiazine Itching and Rash  . Sulfamethoxazole-Trimethoprim Itching and Rash  . Telithromycin Itching and Rash  . Tetracycline Hcl Itching and Rash  . Topiramate Itching and Rash    Past Medical History  Diagnosis Date  . Diverticulosis of colon 02/17/06  . GERD (gastroesophageal reflux disease)   . Hyperlipidemia   . Hypertension   . Seizure disorder ([place])   . Asthma   . LBP (low back pain)   . OA (osteoarthritis)   . Vitamin B 12 deficiency   . Vitamin D deficiency   . Sjogren's disease ([place]) 2010    per Dr. Owens Shark, [place]  . Migraine headache   . Gastric ulcer   . AVM (arteriovenous malformation)   . CVA (cerebral infarction)     following brain surgery  . Depression   . Seizure disorder ([place])   . Colon polyp 01/04/91    hyperplastic  .  Endometriosis   . COPD (chronic obstructive pulmonary disease) ([place])   .  Seizures ([place])     There is no history of radiation to the neck in childhood  Past Surgical History  Procedure Laterality Date  . Mouth surgery    . Nissen fundoplication  [id number]  . Brain surgery  1991  . Temporomandibular joint surgery  02/2001  . Laparoscopic ovarian cystectomy    . Cholecystectomy    . Hemorrhoidectomy with hemorrhoid banding    . Cataract extraction w/ intraocular lens  implant, bilateral      Family History  Problem Relation Age of Onset  . Hypertension Other   . Diabetes Other   . Hypertension Mother   . Heart disease Father   . Pancreatic cancer Father   . Colon cancer Sister 43  . Diabetes Brother    There is a positive family history of pancreatic and colon cancer  Only one family member has history of hypothyroidism but no history of thyroid cancer or goiter  Social History:  reports that she has never smoked. She has never used smokeless tobacco. She reports that she does not drink alcohol or use illicit drugs.   Review of Systems:  No history of recent weight change  No unusual fatigue, thyroid function has been normal  There is a  history of high blood pressure.             No recent mammogram report available in her record         Examination:   BP 128/82 mmHg  Pulse 72  Temp(Src) 98 F (36.7 C)  Resp 14  Ht 5\' 2"  (1.575 m)  Wt 105 lb (47.628 kg)  BMI 19.20 kg/m2  SpO2 97%   General Appearance:  she looks well, pleasant        Eyes: No abnormal prominence or swelling of the eyes         THYROID: Thyroid nodule is not palpable on either side.  There is no lymphadenopathy in the neck  Heart sounds normal Reflexes at biceps are brisk.  Skin: No rash or lesions Extremities: No edema  Assessment/Plan:  Thyroid nodule: She has a mostly cystic nodule which is indeterminate on her needle aspiration biopsy Also appears to have relatively scant material on  her biopsy Discussed in detail for about 15 minutes the nature of thyroid nodules, likelihood of malignancy, characteristics of her thyroid nodule and results of her biopsy and overall clinical scenario Discussed that she likely has a low risk lesion given that it's mostly cystic along with a general picture of multinodular goiter on her ultrasound  She was given the options of repeat needle biopsy with Affirma testing, repeat ultrasound in 4 months or surgical removal of her nodule for diagnosis. The patient has numerous questions and had difficulty making up her mind but finally decided that she will have surgery and she is concerned about malignancy and positive family history of other malignancies  Will refer her to a general surgeon  [facility] 06/11/2015

## 2015-06-13 ENCOUNTER — Telehealth: Payer: Self-pay

## 2015-06-13 NOTE — Telephone Encounter (Signed)
Patient came into office today and wanted a copy of her us/biopsy results and dr Jodelle Green remarks about sx----i have printed copy of those for her to carry with her to an appt with a surgeon on 4/12----she is also asking if dr plotnikov would review the biopsy/us results and notes from dr Dwyane Dee to get dr plotnikov's opinion on having sx---and she wants to know if you need to see her before her 3 mo follow up (due in may) or should she make an office visit before may to talk with you about everything---she has explained her family hx of thyroid cx (and other cx's) on her parent's side and she has asked my opinion about having sx----i advised her that I would probably be most likely to have nodule removed if that is what surgeon recommends, and that she could get a second opinion from another surgeon to help make a good decision about sx----she would like for dr plotnikov to review all results to date and call her back to discuss his opinion on the phone---i advised patient that dr Alain Marion may not call her back today (friday), it could possibly be next week when he returns call---patient is ok with that, but really wants to talk with dr Alain Marion for advisement....Marland Kitchenrouting to dr plotnikov

## 2015-06-13 NOTE — Telephone Encounter (Signed)
Called. Discussed options. Veronica [name] will see Dr Harlow Asa

## 2015-07-08 ENCOUNTER — Other Ambulatory Visit: Payer: Self-pay | Admitting: Surgery

## 2015-07-08 DIAGNOSIS — E041 Nontoxic single thyroid nodule: Secondary | ICD-10-CM

## 2015-07-23 ENCOUNTER — Other Ambulatory Visit: Payer: Self-pay | Admitting: Internal Medicine

## 2015-07-25 ENCOUNTER — Ambulatory Visit
Admission: RE | Admit: 2015-07-25 | Discharge: 2015-07-25 | Disposition: A | Discharge status: Home / Self Care | Payer: Medicare Other | Source: Ambulatory Visit | Attending: Surgery | Admitting: Surgery

## 2015-07-25 ENCOUNTER — Other Ambulatory Visit (HOSPITAL_COMMUNITY)
Admission: RE | Admit: 2015-07-25 | Discharge: 2015-07-25 | Disposition: A | Discharge status: Home / Self Care | Payer: Medicare Other | Source: Ambulatory Visit | Attending: Radiology | Admitting: Radiology

## 2015-07-25 ENCOUNTER — Other Ambulatory Visit: Payer: Self-pay | Admitting: Internal Medicine

## 2015-07-25 DIAGNOSIS — E041 Nontoxic single thyroid nodule: Secondary | ICD-10-CM

## 2015-07-25 NOTE — Telephone Encounter (Signed)
Done

## 2015-07-28 NOTE — Telephone Encounter (Signed)
Called refill into pharmacy had to leave on pharmacy vm.../lmb 

## 2015-08-01 ENCOUNTER — Ambulatory Visit (INDEPENDENT_AMBULATORY_CARE_PROVIDER_SITE_OTHER): Payer: Medicare Other | Admitting: Internal Medicine

## 2015-08-01 ENCOUNTER — Other Ambulatory Visit (INDEPENDENT_AMBULATORY_CARE_PROVIDER_SITE_OTHER): Payer: Medicare Other

## 2015-08-01 ENCOUNTER — Encounter: Payer: Self-pay | Admitting: Internal Medicine

## 2015-08-01 VITALS — BP 148/68 | HR 81 | Temp 98.1°F | Wt 116.0 lb

## 2015-08-01 DIAGNOSIS — R109 Unspecified abdominal pain: Secondary | ICD-10-CM | POA: Insufficient documentation

## 2015-08-01 DIAGNOSIS — R140 Abdominal distension (gaseous): Secondary | ICD-10-CM

## 2015-08-01 DIAGNOSIS — R600 Localized edema: Secondary | ICD-10-CM

## 2015-08-01 DIAGNOSIS — R635 Abnormal weight gain: Secondary | ICD-10-CM | POA: Diagnosis not present

## 2015-08-01 DIAGNOSIS — R609 Edema, unspecified: Secondary | ICD-10-CM | POA: Insufficient documentation

## 2015-08-01 LAB — URINALYSIS
BILIRUBIN URINE: NEGATIVE
HGB URINE DIPSTICK: NEGATIVE
KETONES UR: NEGATIVE
Leukocytes, UA: NEGATIVE
Nitrite: NEGATIVE
Specific Gravity, Urine: <=1.005 — AB (ref 1.000–1.030)
Total Protein, Urine: NEGATIVE
URINE GLUCOSE: NEGATIVE
UROBILINOGEN UA: 0.2 (ref 0.0–1.0)
pH: 7 (ref 5.0–8.0)

## 2015-08-01 LAB — CBC WITH DIFFERENTIAL/PLATELET
BASOS PCT: 0.5 % (ref 0.0–3.0)
Basophils Absolute: 0.1 10*3/uL (ref 0.0–0.1)
EOS ABS: 0 10*3/uL (ref 0.0–0.7)
Eosinophils Relative: 0.4 % (ref 0.0–5.0)
HEMATOCRIT: 41.1 % (ref 36.0–46.0)
Hemoglobin: 13.8 g/dL (ref 12.0–15.0)
LYMPHS PCT: 17.3 % (ref 12.0–46.0)
Lymphs Abs: 1.9 10*3/uL (ref 0.7–4.0)
MCHC: 33.6 g/dL (ref 30.0–36.0)
MCV: 88.9 fl (ref 78.0–100.0)
Monocytes Absolute: 1.7 10*3/uL — ABNORMAL HIGH (ref 0.1–1.0)
Monocytes Relative: 15.2 % — ABNORMAL HIGH (ref 3.0–12.0)
NEUTROS ABS: 7.4 10*3/uL (ref 1.4–7.7)
Neutrophils Relative %: 66.6 % (ref 43.0–77.0)
PLATELETS: 299 10*3/uL (ref 150.0–400.0)
RBC: 4.62 Mil/uL (ref 3.87–5.11)
RDW: 15.4 % (ref 11.5–15.5)
WBC: 11.1 10*3/uL — ABNORMAL HIGH (ref 4.0–10.5)

## 2015-08-01 LAB — BASIC METABOLIC PANEL
BUN: 8 mg/dL (ref 6–23)
CHLORIDE: 104 meq/L (ref 96–112)
CO2: 30 meq/L (ref 19–32)
CREATININE: 0.57 mg/dL (ref 0.40–1.20)
Calcium: 9.4 mg/dL (ref 8.4–10.5)
GFR: 112.67 mL/min (ref >60.00)
Glucose, Bld: 71 mg/dL (ref 70–99)
Potassium: 4.8 mEq/L (ref 3.5–5.1)
Sodium: 141 mEq/L (ref 135–145)

## 2015-08-01 LAB — HEPATIC FUNCTION PANEL
ALBUMIN: 4.1 g/dL (ref 3.5–5.2)
ALT: 11 U/L (ref 0–35)
AST: 17 U/L (ref 0–37)
Alkaline Phosphatase: 97 U/L (ref 39–117)
BILIRUBIN DIRECT: 0.1 mg/dL (ref 0.0–0.3)
TOTAL PROTEIN: 6.5 g/dL (ref 6.0–8.3)
Total Bilirubin: 0.3 mg/dL (ref 0.2–1.2)

## 2015-08-01 LAB — TSH: TSH: 1.49 u[IU]/mL (ref 0.35–4.50)

## 2015-08-01 LAB — BRAIN NATRIURETIC PEPTIDE: PRO B NATRI PEPTIDE: 78 pg/mL (ref 0.0–100.0)

## 2015-08-01 MED ORDER — FUROSEMIDE 20 MG PO TABS: 20.0000 mg | ORAL_TABLET | Freq: Every day | ORAL | Status: DC

## 2015-08-01 NOTE — Assessment & Plan Note (Addendum)
Labs Abd [place] Furosemide

## 2015-08-01 NOTE — Progress Notes (Signed)
Pre visit review using our clinic review tool, if applicable. No additional management support is needed unless otherwise documented below in the visit note. 

## 2015-08-01 NOTE — Assessment & Plan Note (Signed)
Labs Abd [place] Lasix Rx

## 2015-08-01 NOTE — Assessment & Plan Note (Signed)
Labs

## 2015-08-01 NOTE — Progress Notes (Signed)
Subjective:  Patient ID: Veronica [name], female    DOB: 1951-01-28  Age: 65 y.o. MRN: [id number]  CC: Edema   HPI Veronica [name] presents for wt gain, fluid accumulation x 3 weeks  Outpatient Prescriptions Prior to Visit  Medication Sig Dispense Refill  . acetaminophen-codeine (TYLENOL #3) 300-30 MG tablet TAKE 1 TABLET BY MOUTH EVERY 4 HOURS AS NEEDED 60 tablet 3  . Alum & Mag Hydroxide-Simeth (MAGIC MOUTHWASH) SOLN Take 5 mLs by mouth 4 (four) times daily. Swish, hold and swallow four times daily. 300 mL 3  . Cholecalciferol 1000 units tablet Take 1,000 Units by mouth daily.    [name] CIPRODEX otic suspension Place 4 drops into the right ear 2 (two) times daily. 7.5 mL 0  . clonazePAM (KLONOPIN) 1 MG tablet 2 po qhs and 1/2 tab in addition in daytime prn 225 tablet 1  . cyanocobalamin (,VITAMIN B-12,) 1000 MCG/ML injection Inject 1 mL (1,000 mcg total) into the muscle every 14 (fourteen) days. 6 mL 11  . cyclobenzaprine (FLEXERIL) 10 MG tablet Take 1 tablet (10 mg total) by mouth 3 (three) times daily as needed. 90 tablet 3  . Docusate Calcium (STOOL SOFTENER PO) Take by mouth as needed.    [name] escitalopram (LEXAPRO) 20 MG tablet Take 1 tablet by mouth daily.  3  . HYDROcodone-acetaminophen (NORCO/VICODIN) 5-325 MG per tablet Reported on 06/11/2015  0  . Insulin Syringe-Needle U-100 (B-D INSULIN SYRINGE 1CC/26G) 26G X 1/2" 1 ML MISC Inject 1 each into the skin as directed. 100 each 5  . Ipratropium-Albuterol (COMBIVENT RESPIMAT) 20-100 MCG/ACT AERS respimat Inhale 1 puff into the lungs every 6 (six) hours as needed for wheezing or shortness of breath. 1 Inhaler 11  . Omega-3 Fatty Acids (FISH OIL) 1000 MG CAPS Take 1 capsule by mouth 2 (two) times daily.      . phenytoin (DILANTIN) 100 MG ER capsule Take 100 mg by mouth 3 (three) times daily. BMN    . pravastatin (PRAVACHOL) 80 MG tablet TAKE 1 TABLET BY MOUTH EVERY DAY 90 tablet 3  . promethazine (PHENERGAN) 25 MG tablet Take 0.5 tablets  (12.5 mg total) by mouth every 6 (six) hours as needed. 60 tablet 1  . promethazine-codeine (PHENERGAN WITH CODEINE) 6.25-10 MG/5ML syrup Take 5 mLs by mouth every 6 (six) hours as needed for cough. 240 mL 0  . traZODone (DESYREL) 50 MG tablet TAKE 4 TABLETS BY MOUTH AT BEDTIME 360 tablet 3  . verapamil (CALAN-SR) 240 MG CR tablet Take 1 tablet by mouth at bedtime and may repeat dose one time if needed.  5  . HYDROcodone-acetaminophen (NORCO/VICODIN) 5-325 MG tablet Take 1 tablet by mouth every 6 (six) hours as needed for moderate pain or severe pain. (Patient not taking: Reported on 08/01/2015) 6 tablet 0  . levofloxacin (LEVAQUIN) 500 MG tablet Take 1 tablet (500 mg total) by mouth daily. (Patient not taking: Reported on 08/01/2015) 10 tablet 0  . sodium chloride (MURO 128) 5 % ophthalmic ointment Place 1 application into the right eye 4 (four) times daily. Reported on 08/01/2015     No facility-administered medications prior to visit.    ROS Review of Systems  Constitutional: Positive for fatigue and unexpected weight change. Negative for chills, activity change and appetite change.  HENT: Negative for congestion, mouth sores and sinus pressure.   Eyes: Negative for visual disturbance.  Respiratory: Positive for cough. Negative for chest tightness.   Cardiovascular: Positive for leg swelling. Negative  for chest pain.  Gastrointestinal: Positive for abdominal distention. Negative for nausea, abdominal pain and diarrhea.  Genitourinary: Negative for frequency, difficulty urinating and vaginal pain.  Musculoskeletal: Positive for gait problem. Negative for back pain.  Skin: Negative for pallor and rash.  Neurological: Positive for weakness. Negative for dizziness, tremors, numbness and headaches.  Psychiatric/Behavioral: Negative for confusion and sleep disturbance.    Objective:  BP 148/68 mmHg  Pulse 81  Temp(Src) 98.1 F (36.7 C) (Oral)  Wt 116 lb (52.617 kg)  SpO2 96%  BP Readings  from Last 3 Encounters:  08/01/15 148/68  06/11/15 128/82  04/29/15 148/80    Wt Readings from Last 3 Encounters:  08/01/15 116 lb (52.617 kg)  06/11/15 105 lb (47.628 kg)  04/29/15 102 lb (46.267 kg)    Physical Exam  Constitutional: She appears well-developed. No distress.  HENT:  Head: Normocephalic.  Right Ear: External ear normal.  Left Ear: External ear normal.  Nose: Nose normal.  Mouth/Throat: Oropharynx is clear and moist.  Eyes: Conjunctivae are normal. Pupils are equal, round, and reactive to light. Right eye exhibits no discharge. Left eye exhibits no discharge.  Neck: Normal range of motion. Neck supple. No JVD present. No tracheal deviation present. No thyromegaly present.  Cardiovascular: Normal rate, regular rhythm and normal heart sounds.   Pulmonary/Chest: No stridor. No respiratory distress. She has no wheezes.  Abdominal: Soft. Bowel sounds are normal. She exhibits distension. She exhibits no mass. There is no tenderness. There is no rebound and no guarding.  Musculoskeletal: She exhibits edema. She exhibits no tenderness.  Lymphadenopathy:    She has no cervical adenopathy.  Neurological: She displays normal reflexes. No cranial nerve deficit. She exhibits normal muscle tone. Coordination abnormal.  Skin: No rash noted. No erythema.  Psychiatric: She has a normal mood and affect. Her behavior is normal. Judgment and thought content normal.  Cane LEs w/braces 1+ edema B abd w/gas ?ascitis  Lab Results  Component Value Date   WBC 8.4 11/13/2013   HGB 14.5 11/13/2013   HCT 43.7 11/13/2013   PLT 324.0 11/13/2013   GLUCOSE 90 04/29/2015   CHOL 242* 12/06/2014   TRIG 98.0 12/06/2014   HDL 69.60 12/06/2014   LDLDIRECT 165.0 09/26/2009   LDLCALC 153* 12/06/2014   ALT 8 12/06/2014   AST 14 12/06/2014   NA 139 04/29/2015   K 4.8 04/29/2015   CL 100 04/29/2015   CREATININE 0.61 04/29/2015   BUN 9 04/29/2015   CO2 33* 04/29/2015   TSH 1.47 04/29/2015     INR 1.0 ratio 11/07/2009    US Thyroid Biopsy  07/25/2015  INDICATION: Indeterminate thyroid nodule Left thyroid nodule; with cystic component 1.9 cm x 1.0 cm x 1.3 cm EXAM: ULTRASOUND GUIDED FINE NEEDLE ASPIRATION OF INDETERMINATE THYROID NODULE COMPARISON:  [place] left thyroid nodule biopsy 05/20/2015 MEDICATIONS: 5 cc 1% lidocaine COMPLICATIONS: None immediate. TECHNIQUE: Informed written consent was obtained from the patient after a discussion of the risks, benefits and alternatives to treatment. Questions regarding the procedure were encouraged and answered. A timeout was performed prior to the initiation of the procedure. Pre-procedural ultrasound scanning demonstrated left thyroid nodule The procedure was planned. The neck was prepped in the usual sterile fashion, and a sterile drape was applied covering the operative field. A timeout was performed prior to the initiation of the procedure. Local anesthesia was provided with 1% lidocaine. Under direct ultrasound guidance, 3 FNA biopsies were performed of the left thyroid nodule with 25 gauge  needles. 1 aspiration collected 1 cc bloody fluid. 2 FNA biopsies were performed of the left thyroid nodule with 25 gauge needles and sent for [facility] per ordering MD. The samples were prepared and submitted to pathology. Limited post procedural scanning was negative for hematoma or additional complication. Dressings were placed. The patient tolerated the above procedures procedure well without immediate postprocedural complication. IMPRESSION: Technically successful ultrasound guided fine needle aspiration of left thyroid nodule. Read by:  Lavonia Drafts [facility] Electronically Signed   By: Markus Daft M.D.   On: 07/25/2015 12:15    Assessment & Plan:   There are no diagnoses linked to this encounter. I am having Ms. Hallgren maintain her phenytoin, Fish Oil, magic mouthwash, promethazine, traZODone, Docusate Calcium (STOOL SOFTENER PO), pravastatin, CIPRODEX, clonazePAM,  Insulin Syringe-Needle U-100, verapamil, HYDROcodone-acetaminophen, Ipratropium-Albuterol, cyanocobalamin, cyclobenzaprine, levofloxacin, HYDROcodone-acetaminophen, escitalopram, Cholecalciferol, promethazine-codeine, acetaminophen-codeine, and sodium chloride.  Meds ordered this encounter  Medications  . sodium chloride (MURO 128) 5 % ophthalmic solution    Sig: 1 drop as needed for irritation.     Follow-up: No Follow-up on file.  Walker Kehr, MD

## 2015-08-03 ENCOUNTER — Encounter: Payer: Self-pay | Admitting: Internal Medicine

## 2015-08-04 ENCOUNTER — Ambulatory Visit: Payer: Self-pay | Admitting: Surgery

## 2015-08-06 ENCOUNTER — Telehealth: Payer: Self-pay

## 2015-08-06 NOTE — Telephone Encounter (Signed)
Copies mailed to pt

## 2015-08-06 NOTE — Telephone Encounter (Signed)
Patient called wanting the lab results. I informed her that all labs are ok. She would like to have all the results mailed to her today. Please follow up. THank you.

## 2015-08-07 ENCOUNTER — Ambulatory Visit
Admission: RE | Admit: 2015-08-07 | Discharge: 2015-08-07 | Disposition: A | Discharge status: Home / Self Care | Payer: Medicare Other | Source: Ambulatory Visit | Attending: Internal Medicine | Admitting: Internal Medicine

## 2015-08-15 ENCOUNTER — Encounter: Payer: Self-pay | Admitting: Internal Medicine

## 2015-08-15 ENCOUNTER — Ambulatory Visit (INDEPENDENT_AMBULATORY_CARE_PROVIDER_SITE_OTHER): Payer: Medicare Other | Admitting: Internal Medicine

## 2015-08-15 VITALS — BP 118/80 | HR 65 | Wt 116.0 lb

## 2015-08-15 DIAGNOSIS — E041 Nontoxic single thyroid nodule: Secondary | ICD-10-CM

## 2015-08-15 DIAGNOSIS — R635 Abnormal weight gain: Secondary | ICD-10-CM | POA: Diagnosis not present

## 2015-08-15 DIAGNOSIS — R600 Localized edema: Secondary | ICD-10-CM

## 2015-08-15 MED ORDER — PROMETHAZINE-CODEINE 6.25-10 MG/5ML PO SYRP: 5.0000 mL | ORAL_SOLUTION | Freq: Four times a day (QID) | ORAL | Status: DC | PRN

## 2015-08-15 NOTE — Assessment & Plan Note (Signed)
Thyroidectomy is pending on 08/29/15

## 2015-08-15 NOTE — Progress Notes (Signed)
Pre visit review using our clinic review tool, if applicable. No additional management support is needed unless otherwise documented below in the visit note. 

## 2015-08-15 NOTE — Assessment & Plan Note (Signed)
NAS diet Labs/US ok

## 2015-08-15 NOTE — Progress Notes (Signed)
Subjective:  Patient ID: Veronica [name], female    DOB: [birth year]  Age: 65 y.o. MRN: [id number]  CC: No chief complaint on file.   HPI Veronica [name] presents for edema. It turns out that she was eating a lot of salty foods...  Outpatient Prescriptions Prior to Visit  Medication Sig Dispense Refill  . acetaminophen-codeine (TYLENOL #3) 300-30 MG tablet TAKE 1 TABLET BY MOUTH EVERY 4 HOURS AS NEEDED 60 tablet 3  . Alum & Mag Hydroxide-Simeth (MAGIC MOUTHWASH) SOLN Take 5 mLs by mouth 4 (four) times daily. Swish, hold and swallow four times daily. 300 mL 3  . Cholecalciferol 1000 units tablet Take 1,000 Units by mouth daily.    [name] CIPRODEX otic suspension Place 4 drops into the right ear 2 (two) times daily. 7.5 mL 0  . clonazePAM (KLONOPIN) 1 MG tablet 2 po qhs and 1/2 tab in addition in daytime prn 225 tablet 1  . cyanocobalamin (,VITAMIN B-12,) 1000 MCG/ML injection Inject 1 mL (1,000 mcg total) into the muscle every 14 (fourteen) days. 6 mL 11  . cyclobenzaprine (FLEXERIL) 10 MG tablet Take 1 tablet (10 mg total) by mouth 3 (three) times daily as needed. 90 tablet 3  . Docusate Calcium (STOOL SOFTENER PO) Take by mouth as needed.    [name] escitalopram (LEXAPRO) 20 MG tablet Take 1 tablet by mouth daily.  3  . furosemide (LASIX) 20 MG tablet Take 1 tablet (20 mg total) by mouth daily. 30 tablet 2  . HYDROcodone-acetaminophen (NORCO/VICODIN) 5-325 MG per tablet Reported on 06/11/2015  0  . Insulin Syringe-Needle U-100 (B-D INSULIN SYRINGE 1CC/26G) 26G X 1/2" 1 ML MISC Inject 1 each into the skin as directed. 100 each 5  . Ipratropium-Albuterol (COMBIVENT RESPIMAT) 20-100 MCG/ACT AERS respimat Inhale 1 puff into the lungs every 6 (six) hours as needed for wheezing or shortness of breath. 1 Inhaler 11  . Omega-3 Fatty Acids (FISH OIL) 1000 MG CAPS Take 1 capsule by mouth 2 (two) times daily.      . phenytoin (DILANTIN) 100 MG ER capsule Take 100 mg by mouth 3 (three) times daily. BMN    .  pravastatin (PRAVACHOL) 80 MG tablet TAKE 1 TABLET BY MOUTH EVERY DAY 90 tablet 3  . promethazine (PHENERGAN) 25 MG tablet Take 0.5 tablets (12.5 mg total) by mouth every 6 (six) hours as needed. 60 tablet 1  . promethazine-codeine (PHENERGAN WITH CODEINE) 6.25-10 MG/5ML syrup Take 5 mLs by mouth every 6 (six) hours as needed for cough. 240 mL 0  . sodium chloride (MURO 128) 5 % ophthalmic solution 1 drop as needed for irritation.    . traZODone (DESYREL) 50 MG tablet TAKE 4 TABLETS BY MOUTH AT BEDTIME 360 tablet 3  . verapamil (CALAN-SR) 240 MG CR tablet Take 1 tablet by mouth at bedtime and may repeat dose one time if needed.  5  . HYDROcodone-acetaminophen (NORCO/VICODIN) 5-325 MG tablet Take 1 tablet by mouth every 6 (six) hours as needed for moderate pain or severe pain. (Patient not taking: Reported on 08/15/2015) 6 tablet 0   No facility-administered medications prior to visit.    ROS Review of Systems  Constitutional: Positive for fatigue. Negative for chills, activity change, appetite change and unexpected weight change.  HENT: Negative for congestion, mouth sores and sinus pressure.   Eyes: Negative for visual disturbance.  Respiratory: Negative for cough and chest tightness.   Cardiovascular: Positive for leg swelling.  Gastrointestinal: Positive for abdominal distention. Negative  for nausea and abdominal pain.  Genitourinary: Negative for frequency, difficulty urinating and vaginal pain.  Musculoskeletal: Positive for back pain, arthralgias and gait problem.  Skin: Negative for pallor and rash.  Neurological: Negative for dizziness, tremors, weakness, numbness and headaches.  Psychiatric/Behavioral: Negative for confusion and sleep disturbance.    Objective:  BP 118/80 mmHg  Pulse 65  Wt 116 lb (52.617 kg)  SpO2 97%  BP Readings from Last 3 Encounters:  08/15/15 118/80  08/01/15 148/68  06/11/15 128/82    Wt Readings from Last 3 Encounters:  08/15/15 116 lb (52.617  kg)  08/01/15 116 lb (52.617 kg)  06/11/15 105 lb (47.628 kg)    Physical Exam  Constitutional: She appears well-developed. No distress.  HENT:  Head: Normocephalic.  Right Ear: External ear normal.  Left Ear: External ear normal.  Nose: Nose normal.  Mouth/Throat: Oropharynx is clear and moist.  Eyes: Conjunctivae are normal. Pupils are equal, round, and reactive to light. Right eye exhibits no discharge. Left eye exhibits no discharge.  Neck: Normal range of motion. Neck supple. No JVD present. No tracheal deviation present. No thyromegaly present.  Cardiovascular: Normal rate, regular rhythm and normal heart sounds.   Pulmonary/Chest: No stridor. No respiratory distress. She has no wheezes.  Abdominal: Soft. Bowel sounds are normal. She exhibits no distension and no mass. There is no tenderness. There is no rebound and no guarding.  Musculoskeletal: She exhibits tenderness. She exhibits no edema.  Lymphadenopathy:    She has no cervical adenopathy.  Neurological: She displays normal reflexes. No cranial nerve deficit. She exhibits normal muscle tone. Coordination abnormal.  Skin: No rash noted. No erythema.  Psychiatric: She has a normal mood and affect. Her behavior is normal. Judgment and thought content normal.  Cane B ankle braces  Lab Results  Component Value Date   WBC 11.1* 08/01/2015   HGB 13.8 08/01/2015   HCT 41.1 08/01/2015   PLT 299.0 08/01/2015   GLUCOSE 71 08/01/2015   CHOL 242* 12/06/2014   TRIG 98.0 12/06/2014   HDL 69.60 12/06/2014   LDLDIRECT 165.0 09/26/2009   LDLCALC 153* 12/06/2014   ALT 11 08/01/2015   AST 17 08/01/2015   NA 141 08/01/2015   K 4.8 08/01/2015   CL 104 08/01/2015   CREATININE 0.57 08/01/2015   BUN 8 08/01/2015   CO2 30 08/01/2015   TSH 1.49 08/01/2015   INR 1.0 ratio 11/07/2009    US Abdomen Complete  08/07/2015  CLINICAL DATA:  Abdominal distension, edema and weight gain. Previous cholecystectomy. EXAM: ABDOMEN ULTRASOUND  COMPLETE COMPARISON:  CT 08/31/2010 FINDINGS: Gallbladder: Previous cholecystectomy. Common bile duct: Diameter: 4.8 mm. Liver: 1.5 cm focal echogenic lesion over the right lobe likely a hemangioma. IVC: No abnormality visualized. Pancreas: Visualized portion unremarkable. Spleen: Size and appearance within normal limits. Right Kidney: Length: 11.2 cm. Echogenicity within normal limits. No mass or hydronephrosis visualized. Left Kidney: Length: 10.6 cm. Echogenicity within normal limits. No mass or hydronephrosis visualized. Abdominal aorta: Mild atherosclerotic disease without aneurysmal dilatation. Other findings: No free fluid. IMPRESSION: No acute hepatobiliary disease. Previous cholecystectomy. 1.5 cm echogenic lesion over the right lobe of the liver likely a hemangioma. Electronically Signed   By: Marin Olp M.D.   On: 08/07/2015 16:19    Assessment & Plan:   There are no diagnoses linked to this encounter. I am having Veronica [name] maintain her phenytoin, Fish Oil, magic mouthwash, promethazine, traZODone, Docusate Calcium (STOOL SOFTENER PO), pravastatin, CIPRODEX, clonazePAM, Insulin Syringe-Needle U-100,  verapamil, HYDROcodone-acetaminophen, Ipratropium-Albuterol, cyanocobalamin, cyclobenzaprine, HYDROcodone-acetaminophen, escitalopram, Cholecalciferol, promethazine-codeine, acetaminophen-codeine, sodium chloride, furosemide, and Promethazine-Phenyleph-Codeine.  Meds ordered this encounter  Medications  . Promethazine-Phenyleph-Codeine 6.25-5-10 MG/5ML SYRP    Sig: Take 5 mLs by mouth every 4 (four) hours as needed. Reported on 08/15/2015    Refill:  5     Follow-up: No Follow-up on file.  Walker Kehr, MD

## 2015-08-15 NOTE — Patient Instructions (Signed)
Cooking With Less Salt Cooking with less salt is one way to reduce the amount of sodium you get from food. Sodium raises blood pressure and causes water to be held in the body. Getting less sodium from food may help lower your blood pressure, reduce any swelling, and protect your heart, liver, and kidneys.  WHAT DO I NEED TO KNOW ABOUT COOKING WITH LESS SALT?  Buy sodium-free or low-sodium products. Look on the label for the words:   Lower-sodium.  Sodium-free.   Sodium-reduced.   No salt added.   Unsalted.  Check the food label before using or buying packaged ingredients.  Look for products with no more than 150 mg of sodium in one serving.  Do not choose foods with salt as one of the first three ingredients on the ingredients list. If salt is one of the first three ingredients, it usually means the item is high in sodium because ingredients are listed in order of amount in the food item.  Use herbs, seasonings without salt, and spices as substitutes for salt in foods.  Use sodium-free baking soda when baking. WHAT ARE SOME SALT ALTERNATIVES? The following are herbs, seasonings, and spices that can be used instead of salt to give taste to your food. Next to their names are foods they can be used to flavor.  Herbs  Bay-Soups, meat and vegetable dishes, and spaghetti sauce.   Basil-Italian dishes, soups, pasta, and fish dishes.   Cilantro-Meat, poultry, and vegetable dishes.   Chili Powder-Marinades and Mexican dishes.   Chives-Salad dressings and potato dishes.   Cumin-Mexican dishes, couscous, and meat dishes.   Dill-Fish dishes, sauces, and salads.   Fennel-Meat and vegetable dishes, breads, and cookies.   Garlic (do not use garlic salt)-Italian dishes, meat dishes, salad dressings, and sauces.   Marjoram-Soups, potato dishes, and meat dishes.   Oregano-Pizza and spaghetti sauce.   Parsley-Salads, soups, pasta, and meat dishes.    Rosemary-Italian dishes, salad dressings, soups, and red meats.   Saffron-Fish dishes, pasta, and some poultry dishes.   Sage-Stuffings and sauces.   Tarragon-Fish and [employer].   Thyme-Stuffing, meat, and fish dishes.  Herbs should be fresh or dried. Do not choose packaged mixes.  Seasonings  Lemon juice-Fish dishes, poultry dishes, vegetables, and salads.   Vinegar-Salad dressings, vegetables, and fish dishes.  Spices  Cinnamon-Sweet dishes (such as cakes, cookies, and puddings).   Cloves-Gingerbread, puddings, and marinades for meats.   Curry-Vegetable dishes, fish and poultry dishes, and stir-fry dishes.   Ginger-Vegetables dishes, fish dishes, and stir-fry dishes.   Nutmeg-Pasta, vegetables, poultry, fish dishes, and custard.  WHAT ARE SOME LOW-SODIUM INGREDIENTS AND FOODS?  Fresh or frozen fruits and vegetables with no sauce added.  Fresh or frozen whole meats, poultry, and fish with no sauce added.  Eggs.  Noodles, pasta, quinoa, rice.  Shredded or puffed wheat or puffed rice.  Regular or quick oats.  Milk, yogurt, low-sodium cheeses and hard cheeses (such as cheddar, [employer], or mozzarella). Always check the label for serving size and sodium content.  Unsalted butter or margarine.  Unsalted nuts.  Sherbet or ice cream (keep to  cup serving).  Homemade pudding.  Sodium-free baking soda and baking powder. This is not a complete list of low-sodium ingredients and foods. Contact your dietitian for more options.  WHAT HIGH-SODIUM INGREDIENTS ARE NOT RECOMMENDED?  Sauces, such as mustard, barbecue sauce, soy sauce, teriyaki sauce, steak sauce, chili sauce, cocktail sauce, and tartar sauce.  Mixes, such as  flavored rice.  Instant products, such as ready-made pasta.  Horseradish.  Salsa.   Packaged gravies.   Veronica [name].  Olives.  Sauerkraut.  Salted nuts.   Cured or smoked meats (such as hot dogs, bacon,  salami, ham, and bologna).   Processed vegetable juices, such as tomato juice.  Buttermilk.  Processed cheeses (such as cheese dips or cheese spread).  Cottage cheese.  Instant hot cereals.  Dessert mixes (ready-to-make) and store-bought cakes and pies.  Crackers with salted tops. This is not a complete list of high-sodium ingredients. Contact your dietitian for more options.    This information is not intended to replace advice given to you by your health care provider. Make sure you discuss any questions you have with your health care provider.   Document Released: 03/01/2005 Document Revised: 03/22/2014 Document Reviewed: 01/22/2013 Elsevier Interactive Patient Education [employer].

## 2015-08-21 ENCOUNTER — Ambulatory Visit (HOSPITAL_COMMUNITY)
Admission: RE | Admit: 2015-08-21 | Discharge: 2015-08-21 | Disposition: A | Discharge status: Home / Self Care | Payer: Medicare Other | Source: Ambulatory Visit | Attending: Anesthesiology | Admitting: Anesthesiology

## 2015-08-21 ENCOUNTER — Encounter (HOSPITAL_COMMUNITY)
Admission: RE | Admit: 2015-08-21 | Discharge: 2015-08-21 | Disposition: A | Discharge status: Home / Self Care | Payer: Medicare Other | Source: Ambulatory Visit | Attending: Surgery | Admitting: Surgery

## 2015-08-21 ENCOUNTER — Other Ambulatory Visit: Payer: Self-pay

## 2015-08-21 ENCOUNTER — Encounter (HOSPITAL_COMMUNITY): Payer: Self-pay

## 2015-08-21 DIAGNOSIS — R0600 Dyspnea, unspecified: Secondary | ICD-10-CM | POA: Diagnosis not present

## 2015-08-21 DIAGNOSIS — R509 Fever, unspecified: Secondary | ICD-10-CM | POA: Insufficient documentation

## 2015-08-21 DIAGNOSIS — Z01818 Encounter for other preprocedural examination: Secondary | ICD-10-CM | POA: Insufficient documentation

## 2015-08-21 DIAGNOSIS — R05 Cough: Secondary | ICD-10-CM | POA: Diagnosis not present

## 2015-08-21 HISTORY — DX: Cerebral infarction, unspecified: I63.9

## 2015-08-21 HISTORY — DX: Reserved for inherently not codable concepts without codable children: IMO0001

## 2015-08-21 HISTORY — DX: Other specified postprocedural states: Z98.890

## 2015-08-21 HISTORY — DX: Other specified postprocedural states: R11.2

## 2015-08-21 LAB — CBC
HEMATOCRIT: 44.3 % (ref 36.0–46.0)
HEMOGLOBIN: 14.2 g/dL (ref 12.0–15.0)
MCH: 29.1 pg (ref 26.0–34.0)
MCHC: 32.1 g/dL (ref 30.0–36.0)
MCV: 90.8 fL (ref 78.0–100.0)
Platelets: 237 10*3/uL (ref 150–400)
RBC: 4.88 MIL/uL (ref 3.87–5.11)
RDW: 14.6 % (ref 11.5–15.5)
WBC: 8.8 10*3/uL (ref 4.0–10.5)

## 2015-08-21 LAB — BASIC METABOLIC PANEL
Anion gap: 8 (ref 5–15)
CHLORIDE: 101 mmol/L (ref 101–111)
CO2: 30 mmol/L (ref 22–32)
CREATININE: 0.68 mg/dL (ref 0.44–1.00)
Calcium: 9.5 mg/dL (ref 8.9–10.3)
GFR calc non Af Amer: >60 mL/min (ref >60)
Glucose, Bld: 83 mg/dL (ref 65–99)
Potassium: 5.1 mmol/L (ref 3.5–5.1)
Sodium: 139 mmol/L (ref 135–145)

## 2015-08-21 NOTE — Progress Notes (Signed)
pcp is Dr. Oletha Blend Denies ever seeing a cardiologist. Denies ever having a card cath, stress test, or echo.

## 2015-08-21 NOTE — Pre-Procedure Instructions (Signed)
NAFISAH MARENCO  08/21/2015      [id number] - [place], [place] [address] [place] [address] Phone: [phone] Fax: [phone]    Your procedure is scheduled on June 16  Report to [facility] at [place] A.M.  Call this number if you have problems the morning of surgery:  [phone]   Remember:  Do not eat food or drink liquids after midnight.  Take these medicines the morning of surgery with A SIP OF WATER-Tylenol if needed, Clonazepam (Klonopin) if needed, Cyclobenzaprine (Flexreil) if needed, escitalopram (Lexapro), Hydrocodone (norco) if needed, Combivent inhaler if needed- bring with you on the day of surgery, Phenytoin (Dilantin), Phenergan if needed, Muro eye drops if needed    Stop taking aspirin, BC's, Goody's, Herbal medication, Fish Oil Aleve,Ibuprofen, Advil, Motrin   Do not wear jewelry, make-up or nail polish.  Do not wear lotions, powders, or perfumes.  You may wear deodorant.  Do not shave 48 hours prior to surgery.  Men may shave face and neck.  Do not bring valuables to the hospital.  [facility] is not responsible for any belongings or valuables.  Contacts, dentures or bridgework may not be worn into surgery.  Leave your suitcase in the car.  After surgery it may be brought to your room.  For patients admitted to the hospital, discharge time will be determined by your treatment team.  Patients discharged the day of surgery will not be allowed to drive home.    Special instructions:  [place] - Preparing for Surgery  Before surgery, you can play an important role.  Because skin is not sterile, your skin needs to be as free of germs as possible.  You can reduce the number of germs on you skin by washing with CHG (chlorahexidine gluconate) soap before surgery.  CHG is an antiseptic cleaner which kills germs and bonds with the skin to continue killing germs  even after washing.  Please DO NOT use if you have an allergy to CHG or antibacterial soaps.  If your skin becomes reddened/irritated stop using the CHG and inform your nurse when you arrive at Short Stay.  Do not shave (including legs and underarms) for at least 48 hours prior to the first CHG shower.  You may shave your face.  Please follow these instructions carefully:   1.  Shower with CHG Soap the night before surgery and the                                morning of Surgery.  2.  If you choose to wash your hair, wash your hair first as usual with your       normal shampoo.  3.  After you shampoo, rinse your hair and body thoroughly to remove the                      Shampoo.  4.  Use CHG as you would any other liquid soap.  You can apply chg directly       to the skin and wash gently with scrungie or a clean washcloth.  5.  Apply the CHG Soap to your body ONLY FROM THE NECK DOWN.        Do not use on open wounds or open sores.  Avoid contact  with your eyes,       ears, mouth and genitals (private parts).  Wash genitals (private parts)       with your normal soap.  6.  Wash thoroughly, paying special attention to the area where your surgery        will be performed.  7.  Thoroughly rinse your body with warm water from the neck down.  8.  DO NOT shower/wash with your normal soap after using and rinsing off       the CHG Soap.  9.  Pat yourself dry with a clean towel.            10.  Wear clean pajamas.            11.  Place clean sheets on your bed the night of your first shower and do not        sleep with pets.  Day of Surgery  Do not apply any lotions/deoderants the morning of surgery.  Please wear clean clothes to the hospital/surgery center.     Please read over the following fact sheets that you were given. Pain Booklet, Coughing and Deep Breathing and Surgical Site Infection Prevention

## 2015-08-22 NOTE — Progress Notes (Signed)
Anesthesia Chart Review:  Pt is a 65 year old female scheduled for total thyroidectomy on 08/29/2015 with Dr. Harlow Asa.   Neurologist is Dr. Maple Mirza (care everywhere). PCP is Dr. Tyrone Apple Plotnikov.   PMH includes:  HTN, hyperlipidemia, stroke, COPD, seizures, AVM, Sjogren's disease, asthma, post-op N/V, GERD. Never smoker. BMI 21  Medications include: lasix, combivent, phenytoin, pravastatin, verapamil.   Preoperative labs reviewed.    Chest x-ray 08/21/15 reviewed.  - Possible nodular airspace opacification in the right middle lobe may be due to pneumonia. Followup PA and lateral chest X-ray is recommended in 3-4 weeks following trial of antibiotic therapy to ensure resolution and exclude underlying malignancy. - I spoke with pt by telephone. She denies fever, cough, SOB, malaise. She feels well and does not feel acutely ill.  Left message with triage RN about lack of sx and CXR results with triage RN in Dr. Gala Lewandowsky office. I also routed CXR results to PCP for review/follow up.   EKG 08/21/15: sinus bradycardia (56 bpm)  If pt continues to feel well, I anticipate she can proceed with surgery as scheduled.   Willeen Cass, FNP-BC [facility] Short Stay Surgical Center/Anesthesiology Phone: [phone] 08/22/2015 3:04 PM

## 2015-08-27 ENCOUNTER — Encounter (HOSPITAL_COMMUNITY): Payer: Self-pay | Admitting: Surgery

## 2015-08-27 DIAGNOSIS — D440 Neoplasm of uncertain behavior of thyroid gland: Secondary | ICD-10-CM | POA: Diagnosis present

## 2015-08-27 NOTE — H&P (Signed)
General Surgery [facility]  Veronica [name] DOB: 1950-11-04 Divorced / Language: English / Race: White Female  History of Present Illness  The patient is a 65 year old female who presents with a thyroid nodule.  Patient is referred by Dr. Elayne Snare and Dr. Queen Slough for consultation regarding newly diagnosed thyroid nodule with cytologic atypia. Patient has multiple medical issues. As part of her evaluation she underwent a CT scan of the head which showed an incidental finding of a thyroid nodule. Subsequent thyroid ultrasound was performed. On May 09, 2015 that showed a normal size thyroid gland containing multiple small thyroid nodules. The dominant nodule was in the mid left lobe measuring 1.9 cm and was complex. Fine-needle aspiration biopsy was performed on May 20, 2015. 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 This was categorized as Bethesda category 3. Patient discussed this with her endocrinologist. She was offered 3 options. They considered continued observation with follow-up ultrasound versus proceeding with [facility] testing versus surgical excision for definitive diagnosis. Patient presents today for evaluation and to discuss these options. Patient has no prior history of thyroid disease. She has never been on thyroid medication. There is a family history of hypothyroidism and the patient's mother but no family history of other endocrine neoplasms. Patient has had no surgery on the neck. Thyroid function tests show a normal TSH level of 1.47.  Other Problems Asthma Gastroesophageal Reflux Disease Hemorrhoids  Past Surgical History Breast Biopsy Right. Cataract Surgery Bilateral. Colon Polyp Removal - Colonoscopy Gallbladder Surgery - Laparoscopic Hemorrhoidectomy Nissen Fundoplication  Diagnostic Studies History Colonoscopy 1-5 years ago Mammogram >3 years ago Pap Smear >5 years  ago  Medication History Acetaminophen-Codeine #3 (300-30MG  Tablet, Oral) Active. Cyclobenzaprine HCl (10MG  Tablet, Oral) Active. Cyanocobalamin (1000MCG/ML Solution, Injection) Active. ClonazePAM (1MG  Tablet, Oral) Active. Escitalopram Oxalate (20MG  Tablet, Oral) Active. LevoFLOXacin (500MG  Tablet, Oral) Active. Promethazine HCl (25MG  Tablet, Oral) Active. Verapamil HCl ER (240MG  Tablet ER, Oral) Active. TraZODone HCl (50MG  Tablet, Oral) Active. Dilantin (100MG  Capsule, Oral) Active. Muro 128 (5% Ointment, Ophthalmic) Active. Fish Oil (1000MG  Capsule, Oral) Active. Combivent Respimat (20-100MCG/ACT Aerosol Soln, Inhalation) Active. Alum & Mag Hydroxide-Simeth (200-200-25MG /5ML Suspension, Oral) Active. Cholecalciferol (1000UNIT Tablet, Oral) Active. Ciprodex (0.3-0.1% Suspension, Otic) Active. Docusate Sodium (100MG  Capsule, Oral) Active. Medications Reconciled  Family History Mammie Lorenzo, LPN; [id number] AM) Arthritis Family Members In General, Mother. Bleeding disorder Family Members In General. Cancer Family Members In General, Sister. Cerebrovascular Accident Family Members In [place] Members In General, Sister. Colon Polyps Brother, Family Members In General. Depression Brother. Diabetes Mellitus Brother. Heart Disease Brother. Malignant Neoplasm Of Pancreas Father. Thyroid problems Family Members In General, Mother.  Pregnancy / Birth History Age of menopause <44  Review of Systems General Present- Fatigue and Weight Gain. Not Present- Appetite Loss, Chills, Fever, Night Sweats and Weight Loss. Skin Present- Dryness. Not Present- Change in Wart/Mole, Hives, Jaundice, New Lesions, Non-Healing Wounds, Rash and Ulcer. HEENT Present- Seasonal Allergies and Wears glasses/contact lenses. Not Present- Earache, Hearing Loss, Hoarseness, Nose Bleed, Oral Ulcers, Ringing in the Ears, Sinus Pain, Sore Throat, Visual  Disturbances and Yellow Eyes. Breast Not Present- Breast Mass, Breast Pain, Nipple Discharge and Skin Changes. Cardiovascular Present- Leg Cramps. Not Present- Chest Pain, Difficulty Breathing Lying Down, Palpitations, Rapid Heart Rate, Shortness of Breath and Swelling of Extremities. Gastrointestinal Present- Bloating. Not Present- Abdominal Pain, Bloody Stool, Change in Bowel Habits, Chronic diarrhea, Constipation, Difficulty Swallowing, Excessive gas, Gets full quickly  at meals, Hemorrhoids, Indigestion, Nausea, Rectal Pain and Vomiting. Female Genitourinary Present- Urgency. Not Present- Frequency, Nocturia, Painful Urination and Pelvic Pain. Neurological Present- Headaches. Not Present- Decreased Memory, Fainting, Numbness, Seizures, Tingling, Tremor, Trouble walking and Weakness.  Vitals Weight: 113.6 lb Height: 62in Body Surface Area: 1.5 m Body Mass Index: 20.78 kg/m  Temp.: 98.87F(Oral)  Pulse: 73 (Regular)  BP: 100/68 (Sitting, Left Arm, Standard)  Physical Exam  General - appears comfortable, no distress; not diaphorectic  HEENT - normocephalic; sclerae clear, gaze conjugate; mucous membranes moist, dentition good; voice normal  Neck - symmetric on extension; no palpable anterior or posterior cervical adenopathy; subtle soft nodularity left thyroid lobe without dominant or discrete mass; right thyroid lobe without palpable abnormality  Chest - clear bilaterally without rhonchi, rales, or wheeze  Cor - regular rhythm with normal rate; no significant murmur  Ext - non-tender without significant edema or lymphedema  Neuro - grossly intact; no tremor   Assessment & Plan  NEOPLASM OF UNCERTAIN BEHAVIOR OF THYROID GLAND (D44.0)  Patient presents with thyroid neoplasm of uncertain behavior for surgical consultation. Patient is presented with written literature to review at home.  After extensive consideration, the patient underwent repeat biopsy for molecular  genetic testing.  Unfortunately, an inadequate sample was obtained.  After further discussion, a decision was made to proceed with thyroidectomy for definitive diagnosis and management.  Risk and benefits were discussed at length including the potential for recurrent laryngeal nerve injury and injury to parathyroid glands.  We discussed the hospital stay to be anticipated and the postoperative recovery.  Patient understands and wishes to proceed.  The risks and benefits of the procedure have been discussed at length with the patient.  The patient understands the proposed procedure, potential alternative treatments, and the course of recovery to be expected.  All of the patient's questions have been answered at this time.  The patient wishes to proceed with surgery.  Earnstine Regal, MD, [facility] Office: [phone]

## 2015-08-28 MED ORDER — VANCOMYCIN HCL IN DEXTROSE 1-5 GM/200ML-% IV SOLN
1000.0000 mg | INTRAVENOUS | Status: AC
Start: 2015-08-29 — End: 2015-08-29
  Administered 2015-08-29: 1000 mg via INTRAVENOUS
  Filled 2015-08-28: qty 200

## 2015-08-29 ENCOUNTER — Ambulatory Visit (HOSPITAL_COMMUNITY): Payer: Medicare Other | Admitting: Emergency Medicine

## 2015-08-29 ENCOUNTER — Encounter (HOSPITAL_COMMUNITY): Payer: Self-pay | Admitting: Surgery

## 2015-08-29 ENCOUNTER — Observation Stay (HOSPITAL_COMMUNITY)
Admission: RE | Admit: 2015-08-29 | Discharge: 2015-08-31 | Disposition: A | Discharge status: Home / Self Care | Payer: Medicare Other | Source: Ambulatory Visit | Attending: Surgery | Admitting: Surgery

## 2015-08-29 ENCOUNTER — Encounter (HOSPITAL_COMMUNITY)
Admission: RE | Disposition: A | Discharge status: Home / Self Care | Payer: Self-pay | Source: Ambulatory Visit | Attending: Surgery

## 2015-08-29 DIAGNOSIS — K219 Gastro-esophageal reflux disease without esophagitis: Secondary | ICD-10-CM | POA: Insufficient documentation

## 2015-08-29 DIAGNOSIS — J449 Chronic obstructive pulmonary disease, unspecified: Secondary | ICD-10-CM | POA: Diagnosis not present

## 2015-08-29 DIAGNOSIS — E063 Autoimmune thyroiditis: Secondary | ICD-10-CM | POA: Diagnosis not present

## 2015-08-29 DIAGNOSIS — D34 Benign neoplasm of thyroid gland: Secondary | ICD-10-CM | POA: Diagnosis not present

## 2015-08-29 DIAGNOSIS — J45909 Unspecified asthma, uncomplicated: Secondary | ICD-10-CM | POA: Diagnosis not present

## 2015-08-29 DIAGNOSIS — D440 Neoplasm of uncertain behavior of thyroid gland: Secondary | ICD-10-CM | POA: Diagnosis present

## 2015-08-29 DIAGNOSIS — I10 Essential (primary) hypertension: Secondary | ICD-10-CM | POA: Insufficient documentation

## 2015-08-29 HISTORY — PX: TOTAL THYROIDECTOMY: SHX2547

## 2015-08-29 HISTORY — PX: THYROIDECTOMY: SHX17

## 2015-08-29 HISTORY — DX: Nontoxic single thyroid nodule: E04.1

## 2015-08-29 SURGERY — THYROIDECTOMY
Anesthesia: General | Site: Neck

## 2015-08-29 MED ORDER — FENTANYL CITRATE (PF) 100 MCG/2ML IJ SOLN
25.0000 ug | INTRAMUSCULAR | Status: AC | PRN
  Administered 2015-08-29: 25 ug via INTRAVENOUS
  Administered 2015-08-29: 50 ug via INTRAVENOUS
  Administered 2015-08-29 (×4): 25 ug via INTRAVENOUS

## 2015-08-29 MED ORDER — SUCCINYLCHOLINE CHLORIDE 200 MG/10ML IV SOSY
PREFILLED_SYRINGE | INTRAVENOUS | Status: AC
  Filled 2015-08-29: qty 10

## 2015-08-29 MED ORDER — LACTATED RINGERS IV SOLN
INTRAVENOUS | Status: DC
  Administered 2015-08-29 (×2): via INTRAVENOUS

## 2015-08-29 MED ORDER — VERAPAMIL HCL ER 240 MG PO TBCR
240.0000 mg | EXTENDED_RELEASE_TABLET | Freq: Every day | ORAL | Status: DC
  Administered 2015-08-30: 240 mg via ORAL
  Filled 2015-08-29: qty 1

## 2015-08-29 MED ORDER — PROMETHAZINE HCL 25 MG/ML IJ SOLN
6.2500 mg | INTRAMUSCULAR | Status: AC | PRN
  Administered 2015-08-29 (×2): 6.25 mg via INTRAVENOUS

## 2015-08-29 MED ORDER — MIDAZOLAM HCL 2 MG/2ML IJ SOLN
INTRAMUSCULAR | Status: AC
  Filled 2015-08-29: qty 2

## 2015-08-29 MED ORDER — FENTANYL CITRATE (PF) 250 MCG/5ML IJ SOLN
INTRAMUSCULAR | Status: AC
  Filled 2015-08-29: qty 5

## 2015-08-29 MED ORDER — ROCURONIUM BROMIDE 50 MG/5ML IV SOLN
INTRAVENOUS | Status: AC
  Filled 2015-08-29: qty 1

## 2015-08-29 MED ORDER — ACETAMINOPHEN 650 MG RE SUPP: 650.0000 mg | Freq: Four times a day (QID) | RECTAL | Status: DC | PRN

## 2015-08-29 MED ORDER — ACETAMINOPHEN 325 MG PO TABS: 650.0000 mg | ORAL_TABLET | Freq: Four times a day (QID) | ORAL | Status: DC | PRN

## 2015-08-29 MED ORDER — FENTANYL CITRATE (PF) 100 MCG/2ML IJ SOLN
INTRAMUSCULAR | Status: AC
  Administered 2015-08-29: 25 ug via INTRAVENOUS
  Filled 2015-08-29: qty 2

## 2015-08-29 MED ORDER — DEXAMETHASONE SODIUM PHOSPHATE 10 MG/ML IJ SOLN
INTRAMUSCULAR | Status: AC
  Filled 2015-08-29: qty 1

## 2015-08-29 MED ORDER — 0.9 % SODIUM CHLORIDE (POUR BTL) OPTIME
TOPICAL | Status: DC | PRN
  Administered 2015-08-29: 1000 mL

## 2015-08-29 MED ORDER — PHENYTOIN SODIUM EXTENDED 100 MG PO CAPS
100.0000 mg | ORAL_CAPSULE | Freq: Every day | ORAL | Status: DC
  Administered 2015-08-30: 100 mg via ORAL
  Filled 2015-08-29: qty 1

## 2015-08-29 MED ORDER — ONDANSETRON HCL 4 MG/2ML IJ SOLN
4.0000 mg | Freq: Four times a day (QID) | INTRAMUSCULAR | Status: DC | PRN
  Administered 2015-08-29 – 2015-08-31 (×5): 4 mg via INTRAVENOUS
  Filled 2015-08-29 (×5): qty 2

## 2015-08-29 MED ORDER — HEMOSTATIC AGENTS (NO CHARGE) OPTIME
TOPICAL | Status: DC | PRN
  Administered 2015-08-29: 1 via TOPICAL

## 2015-08-29 MED ORDER — CLONAZEPAM 0.5 MG PO TABS
0.5000 mg | ORAL_TABLET | Freq: Two times a day (BID) | ORAL | Status: DC | PRN
  Administered 2015-08-30: 2 mg via ORAL
  Filled 2015-08-29: qty 4

## 2015-08-29 MED ORDER — FENTANYL CITRATE (PF) 100 MCG/2ML IJ SOLN
INTRAMUSCULAR | Status: AC
  Administered 2015-08-29: 50 ug via INTRAVENOUS
  Filled 2015-08-29: qty 2

## 2015-08-29 MED ORDER — HYDROCODONE-ACETAMINOPHEN 5-325 MG PO TABS
1.0000 | ORAL_TABLET | ORAL | Status: DC | PRN
  Administered 2015-08-30 – 2015-08-31 (×7): 2 via ORAL
  Filled 2015-08-29 (×7): qty 2

## 2015-08-29 MED ORDER — PROPOFOL 10 MG/ML IV BOLUS
INTRAVENOUS | Status: DC | PRN
  Administered 2015-08-29: 150 mg via INTRAVENOUS

## 2015-08-29 MED ORDER — HYDROMORPHONE HCL 1 MG/ML IJ SOLN
1.0000 mg | INTRAMUSCULAR | Status: DC | PRN
  Administered 2015-08-29 – 2015-08-31 (×10): 1 mg via INTRAVENOUS
  Filled 2015-08-29 (×10): qty 1

## 2015-08-29 MED ORDER — EPHEDRINE 5 MG/ML INJ
INTRAVENOUS | Status: AC
  Filled 2015-08-29: qty 10

## 2015-08-29 MED ORDER — ONDANSETRON 4 MG PO TBDP
4.0000 mg | ORAL_TABLET | Freq: Four times a day (QID) | ORAL | Status: DC | PRN
Start: 2015-08-29 — End: 2015-08-31
  Administered 2015-08-31: 4 mg via ORAL
  Filled 2015-08-29: qty 1

## 2015-08-29 MED ORDER — PHENYTOIN SODIUM EXTENDED 100 MG PO CAPS: 100.0000 mg | ORAL_CAPSULE | Freq: Two times a day (BID) | ORAL | Status: DC

## 2015-08-29 MED ORDER — SUGAMMADEX SODIUM 200 MG/2ML IV SOLN
INTRAVENOUS | Status: DC | PRN
  Administered 2015-08-29: 100 mg via INTRAVENOUS

## 2015-08-29 MED ORDER — ONDANSETRON HCL 4 MG/2ML IJ SOLN
INTRAMUSCULAR | Status: DC | PRN
  Administered 2015-08-29: 4 mg via INTRAVENOUS

## 2015-08-29 MED ORDER — EPHEDRINE SULFATE 50 MG/ML IJ SOLN
INTRAMUSCULAR | Status: DC | PRN
  Administered 2015-08-29: 10 mg via INTRAVENOUS
  Administered 2015-08-29: 5 mg via INTRAVENOUS
  Administered 2015-08-29: 10 mg via INTRAVENOUS

## 2015-08-29 MED ORDER — LORAZEPAM 2 MG/ML IJ SOLN
1.0000 mg | Freq: Once | INTRAMUSCULAR | Status: AC
  Administered 2015-08-29: 1 mg via INTRAVENOUS
  Filled 2015-08-29: qty 1

## 2015-08-29 MED ORDER — TRAZODONE HCL 100 MG PO TABS
200.0000 mg | ORAL_TABLET | Freq: Every day | ORAL | Status: DC
  Administered 2015-08-30: 200 mg via ORAL
  Filled 2015-08-29: qty 2

## 2015-08-29 MED ORDER — CALCIUM CARBONATE 1250 (500 CA) MG PO TABS
2.0000 | ORAL_TABLET | Freq: Three times a day (TID) | ORAL | Status: DC
  Administered 2015-08-29 – 2015-08-31 (×5): 1000 mg via ORAL
  Filled 2015-08-29 (×5): qty 1

## 2015-08-29 MED ORDER — FENTANYL CITRATE (PF) 100 MCG/2ML IJ SOLN
INTRAMUSCULAR | Status: DC | PRN
  Administered 2015-08-29: 100 ug via INTRAVENOUS
  Administered 2015-08-29 (×2): 50 ug via INTRAVENOUS

## 2015-08-29 MED ORDER — IPRATROPIUM-ALBUTEROL 0.5-2.5 (3) MG/3ML IN SOLN: 3.0000 mL | Freq: Four times a day (QID) | RESPIRATORY_TRACT | Status: DC | PRN

## 2015-08-29 MED ORDER — DEXAMETHASONE SODIUM PHOSPHATE 10 MG/ML IJ SOLN
INTRAMUSCULAR | Status: DC | PRN
  Administered 2015-08-29: 5 mg via INTRAVENOUS

## 2015-08-29 MED ORDER — SUGAMMADEX SODIUM 200 MG/2ML IV SOLN
INTRAVENOUS | Status: AC
  Filled 2015-08-29: qty 2

## 2015-08-29 MED ORDER — LIDOCAINE 2% (20 MG/ML) 5 ML SYRINGE
INTRAMUSCULAR | Status: AC
  Filled 2015-08-29: qty 5

## 2015-08-29 MED ORDER — PROMETHAZINE HCL 25 MG/ML IJ SOLN
INTRAMUSCULAR | Status: AC
  Administered 2015-08-29: 6.25 mg via INTRAVENOUS
  Filled 2015-08-29: qty 1

## 2015-08-29 MED ORDER — FUROSEMIDE 20 MG PO TABS
20.0000 mg | ORAL_TABLET | Freq: Every day | ORAL | Status: DC
  Administered 2015-08-30 – 2015-08-31 (×2): 20 mg via ORAL
  Filled 2015-08-29 (×2): qty 1

## 2015-08-29 MED ORDER — ROCURONIUM BROMIDE 100 MG/10ML IV SOLN
INTRAVENOUS | Status: DC | PRN
  Administered 2015-08-29: 40 mg via INTRAVENOUS

## 2015-08-29 MED ORDER — LIDOCAINE HCL (CARDIAC) 20 MG/ML IV SOLN
INTRAVENOUS | Status: DC | PRN
  Administered 2015-08-29: 60 mg via INTRAVENOUS
  Administered 2015-08-29: 50 mg via INTRAVENOUS

## 2015-08-29 MED ORDER — MIDAZOLAM HCL 5 MG/5ML IJ SOLN
INTRAMUSCULAR | Status: DC | PRN
  Administered 2015-08-29: 2 mg via INTRAVENOUS

## 2015-08-29 MED ORDER — LIDOCAINE 2% (20 MG/ML) 5 ML SYRINGE
INTRAMUSCULAR | Status: AC
  Filled 2015-08-29: qty 10

## 2015-08-29 MED ORDER — ONDANSETRON HCL 4 MG/2ML IJ SOLN
INTRAMUSCULAR | Status: AC
  Filled 2015-08-29: qty 2

## 2015-08-29 MED ORDER — ESCITALOPRAM OXALATE 20 MG PO TABS
20.0000 mg | ORAL_TABLET | Freq: Every day | ORAL | Status: DC
  Administered 2015-08-30 – 2015-08-31 (×2): 20 mg via ORAL
  Filled 2015-08-29 (×2): qty 1

## 2015-08-29 MED ORDER — PHENYTOIN SODIUM EXTENDED 100 MG PO CAPS
200.0000 mg | ORAL_CAPSULE | Freq: Every day | ORAL | Status: DC
  Administered 2015-08-30 – 2015-08-31 (×2): 200 mg via ORAL
  Filled 2015-08-29 (×2): qty 2

## 2015-08-29 MED ORDER — KCL IN DEXTROSE-NACL 20-5-0.45 MEQ/L-%-% IV SOLN
INTRAVENOUS | Status: DC
  Administered 2015-08-29: 18:00:00 via INTRAVENOUS
  Filled 2015-08-29: qty 1000

## 2015-08-29 MED ORDER — SUCCINYLCHOLINE CHLORIDE 20 MG/ML IJ SOLN
INTRAMUSCULAR | Status: DC | PRN
  Administered 2015-08-29: 100 mg via INTRAVENOUS

## 2015-08-29 SURGICAL SUPPLY — 55 items
APL SKNCLS STERI-STRIP NONHPOA (GAUZE/BANDAGES/DRESSINGS) ×1
ATTRACTOMAT 16X20 MAGNETIC DRP (DRAPES) ×3 IMPLANT
BENZOIN TINCTURE PRP APPL 2/3 (GAUZE/BANDAGES/DRESSINGS) ×3 IMPLANT
BLADE SURG 10 STRL SS (BLADE) ×3 IMPLANT
BLADE SURG 15 STRL LF DISP TIS (BLADE) ×1 IMPLANT
BLADE SURG 15 STRL SS (BLADE) ×3
BLADE SURG ROTATE 9660 (MISCELLANEOUS) IMPLANT
CANISTER SUCTION 2500CC (MISCELLANEOUS) ×3 IMPLANT
CHLORAPREP W/TINT 10.5 ML (MISCELLANEOUS) ×3 IMPLANT
CLIP TI MEDIUM 24 (CLIP) ×3 IMPLANT
CLIP TI WIDE RED SMALL 24 (CLIP) ×3 IMPLANT
CLOSURE STERI-STRIP 1/2X4 (GAUZE/BANDAGES/DRESSINGS) ×1
CLOSURE WOUND 1/2 X4 (GAUZE/BANDAGES/DRESSINGS) ×1
CLSR STERI-STRIP ANTIMIC 1/2X4 (GAUZE/BANDAGES/DRESSINGS) ×1 IMPLANT
CONT SPEC 4OZ CLIKSEAL STRL BL (MISCELLANEOUS) ×2 IMPLANT
COVER SURGICAL LIGHT HANDLE (MISCELLANEOUS) ×3 IMPLANT
CRADLE DONUT ADULT HEAD (MISCELLANEOUS) ×3 IMPLANT
DRAPE LAPAROTOMY 100X72 PEDS (DRAPES) ×3 IMPLANT
DRAPE UTILITY XL STRL (DRAPES) ×3 IMPLANT
ELECT CAUTERY BLADE 6.4 (BLADE) ×3 IMPLANT
ELECT REM PT RETURN 9FT ADLT (ELECTROSURGICAL) ×3
ELECTRODE REM PT RTRN 9FT ADLT (ELECTROSURGICAL) ×1 IMPLANT
GAUZE SPONGE 4X4 12PLY STRL (GAUZE/BANDAGES/DRESSINGS) ×3 IMPLANT
GAUZE SPONGE 4X4 16PLY XRAY LF (GAUZE/BANDAGES/DRESSINGS) ×3 IMPLANT
GLOVE BIOGEL PI IND STRL 8.5 (GLOVE) IMPLANT
GLOVE BIOGEL PI INDICATOR 8.5 (GLOVE) ×2
GLOVE SURG ORTHO 8.0 STRL STRW (GLOVE) ×3 IMPLANT
GLOVE SURG SS PI 8.0 STRL IVOR (GLOVE) ×2 IMPLANT
GOWN STRL REUS W/ TWL LRG LVL3 (GOWN DISPOSABLE) ×2 IMPLANT
GOWN STRL REUS W/ TWL XL LVL3 (GOWN DISPOSABLE) ×1 IMPLANT
GOWN STRL REUS W/TWL LRG LVL3 (GOWN DISPOSABLE) ×3
GOWN STRL REUS W/TWL XL LVL3 (GOWN DISPOSABLE) ×6
HEMOSTAT SURGICEL 2X4 FIBR (HEMOSTASIS) ×3 IMPLANT
KIT BASIN OR (CUSTOM PROCEDURE TRAY) ×3 IMPLANT
KIT ROOM TURNOVER OR (KITS) ×3 IMPLANT
LIGHT WAVEGUIDE WIDE FLAT (MISCELLANEOUS) ×2 IMPLANT
NS IRRIG 1000ML POUR BTL (IV SOLUTION) ×3 IMPLANT
PACK SURGICAL SETUP 50X90 (CUSTOM PROCEDURE TRAY) ×3 IMPLANT
PAD ARMBOARD 7.5X6 YLW CONV (MISCELLANEOUS) ×3 IMPLANT
PENCIL BUTTON HOLSTER BLD 10FT (ELECTRODE) ×3 IMPLANT
SHEARS HARMONIC 9CM CVD (BLADE) ×3 IMPLANT
SPECIMEN JAR MEDIUM (MISCELLANEOUS) ×2 IMPLANT
SPONGE GAUZE 4X4 12PLY STER LF (GAUZE/BANDAGES/DRESSINGS) ×2 IMPLANT
SPONGE INTESTINAL PEANUT (DISPOSABLE) ×3 IMPLANT
STRIP CLOSURE SKIN 1/2X4 (GAUZE/BANDAGES/DRESSINGS) ×2 IMPLANT
SUT MNCRL AB 4-0 PS2 18 (SUTURE) ×3 IMPLANT
SUT SILK 2 0 (SUTURE) ×3
SUT SILK 2-0 18XBRD TIE 12 (SUTURE) ×1 IMPLANT
SUT VIC AB 3-0 SH 18 (SUTURE) ×3 IMPLANT
SYR BULB 3OZ (MISCELLANEOUS) ×3 IMPLANT
TAPE CLOTH SURG 4X10 WHT LF (GAUZE/BANDAGES/DRESSINGS) ×2 IMPLANT
TOWEL OR 17X24 6PK STRL BLUE (TOWEL DISPOSABLE) ×3 IMPLANT
TOWEL OR 17X26 10 PK STRL BLUE (TOWEL DISPOSABLE) ×3 IMPLANT
TUBE CONNECTING 12'X1/4 (SUCTIONS) ×1
TUBE CONNECTING 12X1/4 (SUCTIONS) ×2 IMPLANT

## 2015-08-29 NOTE — Anesthesia Preprocedure Evaluation (Addendum)
Anesthesia Evaluation  Patient identified by MRN, date of birth, ID band Patient awake    Reviewed: Allergy & Precautions, NPO status , Patient's Chart, lab work & pertinent test results  History of Anesthesia Complications (+) PONV  Airway Mallampati: II  TM Distance: >3 FB Neck ROM: Full    Dental   Pulmonary shortness of breath, asthma , COPD,    breath sounds clear to auscultation       Cardiovascular hypertension,  Rhythm:Regular Rate:Normal     Neuro/Psych    GI/Hepatic Neg liver ROS, PUD, GERD  ,  Endo/Other  negative endocrine ROS  Renal/GU negative Renal ROS     Musculoskeletal   Abdominal   Peds  Hematology   Anesthesia Other Findings   Reproductive/Obstetrics                            Anesthesia Physical Anesthesia Plan  ASA: III  Anesthesia Plan: General   Post-op Pain Management:    Induction: Intravenous  Airway Management Planned: Oral ETT  Additional Equipment:   Intra-op Plan:   Post-operative Plan: Possible Post-op intubation/ventilation  Informed Consent: I have reviewed the patients History and Physical, chart, labs and discussed the procedure including the risks, benefits and alternatives for the proposed anesthesia with the patient or authorized representative who has indicated his/her understanding and acceptance.   Dental advisory given  Plan Discussed with: CRNA and Anesthesiologist  Anesthesia Plan Comments:         Anesthesia Quick Evaluation

## 2015-08-29 NOTE — Anesthesia Procedure Notes (Signed)
Procedure Name: Intubation Date/Time: 08/29/2015 2:28 PM Performed by: Freddie Breech Pre-anesthesia Checklist: Patient identified, Emergency Drugs available, Suction available, Patient being monitored and Timeout performed Patient Re-evaluated:Patient Re-evaluated prior to inductionOxygen Delivery Method: Circle system utilized Preoxygenation: Pre-oxygenation with 100% oxygen Intubation Type: IV induction Ventilation: Mask ventilation without difficulty Laryngoscope Size: Mac and 3 Grade View: Grade II Tube size: 7.0 mm Number of attempts: 1 Airway Equipment and Method: Patient positioned with wedge pillow and Stylet Placement Confirmation: ETT inserted through vocal cords under direct vision,  positive ETCO2,  CO2 detector and breath sounds checked- equal and bilateral Secured at: 21 cm Tube secured with: Tape Dental Injury: Teeth and Oropharynx as per pre-operative assessment

## 2015-08-29 NOTE — Interval H&P Note (Signed)
History and Physical Interval Note:  08/29/2015 1:39 PM  Veronica [name]  has presented today for surgery, with the diagnosis of Thyroid neoplasm of uncertain behavior.  The various methods of treatment have been discussed with the patient and family. After consideration of risks, benefits and other options for treatment, the patient has consented to    Procedure(s):  TOTAL THYROIDECTOMY (N/A) as a surgical intervention .    The patient's history has been reviewed, patient examined, no change in status, stable for surgery.  I have reviewed the patient's chart and labs.  Questions were answered to the patient's satisfaction.    Earnstine Regal, MD, [facility] Office: [place]

## 2015-08-29 NOTE — Op Note (Signed)
Procedure Note  Pre-operative Diagnosis:  Thyroid neoplasm of uncertain behavior  Post-operative Diagnosis:  same  Surgeon:  Earnstine Regal, MD, FACS  Assistant:  none   Procedure:  Total thyroidectomy  Anesthesia:  General  Estimated Blood Loss:  minimal  Drains: none         Specimen: thyroid to pathology  Indications:  Patient is referred by Dr. Elayne Snare and Dr. Reino Kent Plotnikov for consultation regarding newly diagnosed thyroid nodule with cytologic atypia. Patient has multiple medical issues. As part of her evaluation she underwent a CT scan of the head which showed an incidental finding of a thyroid nodule. Subsequent thyroid ultrasound was performed. On May 09, 2015 that showed a normal size thyroid gland containing multiple small thyroid nodules. The dominant nodule was in the mid left lobe measuring 1.9 cm and was complex. Fine-needle aspiration biopsy was performed on May 20, 2015. 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 This was categorized as Bethesda category 3. Patient discussed this with her endocrinologist. She was offered 3 options. They considered continued observation with follow-up ultrasound versus proceeding with [facility] testing versus surgical excision for definitive diagnosis. After attempted biopsy for [facility] testing, the patient comes to surgery for thyroidectomy for definitive diagnosis.  Procedure Details: Procedure was done in OR #2 at the [facility].  The patient was brought to the operating room and placed in a supine position on the operating room table.  Following administration of general anesthesia, the patient was positioned and then prepped and draped in the usual aseptic fashion.  After ascertaining that an adequate level of anesthesia had been achieved, a Kocher incision was made with #15 blade.  Dissection was carried through subcutaneous tissues and platysma. Hemostasis was achieved with the  electrocautery.  Skin flaps were elevated cephalad and caudad from the thyroid notch to the sternal notch.  The Mahorner self-retaining retractor was placed for exposure.  Strap muscles were incised in the midline and dissection was begun on the left side.  Strap muscles were reflected laterally.  Left thyroid lobe was normal in size and slightly nodular.  The left lobe was gently mobilized with blunt dissection.  Superior pole vessels were dissected out and divided individually between small and medium Ligaclips with the Harmonic scalpel.  The thyroid lobe was rolled anteriorly.  Branches of the inferior thyroid artery were divided between small Ligaclips with the Harmonic scalpel.  Inferior venous tributaries were divided between Ligaclips.  Both the superior and inferior parathyroid glands were identified and preserved on their vascular pedicles.  The recurrent laryngeal nerve was identified and preserved along its course.  The ligament of Gwenlyn Found was released with the electrocautery and the gland was mobilized onto the anterior trachea. Isthmus was mobilized across the midline.  There was no pyramidal lobe present.  Dry pack was placed in the left neck.  Next, the right thyroid lobe was gently mobilized with blunt dissection.  Right thyroid lobe was normal in size and slightly nodular.  Superior pole vessels were dissected out and divided between small and medium Ligaclips with the Harmonic scalpel.  Superior parathyroid was identified and preserved.  Inferior venous tributaries were divided between medium Ligaclips with the Harmonic scalpel.  The right thyroid lobe was rolled anteriorly and the branches of the inferior thyroid artery divided between small Ligaclips.  The right recurrent laryngeal nerve was identified and preserved along its course.  The ligament of Gwenlyn Found was released with the electrocautery.  The right  thyroid lobe was mobilized onto the anterior trachea and the remainder of the thyroid was  dissected off the anterior trachea and the thyroid was completely excised.  A suture was used to mark the right lobe. The entire thyroid gland was submitted to pathology for review.  The neck was irrigated with warm saline.  Fibrillar was placed throughout the operative field.  Strap muscles were reapproximated in the midline with interrupted 3-0 Vicryl sutures.  Platysma was closed with interrupted 3-0 Vicryl sutures.  Skin was closed with a running 4-0 Monocryl subcuticular suture.  Wound was washed and dried and benzoin and steri-strips were applied.  Dry gauze dressing was placed.  The patient was awakened from anesthesia and brought to the recovery room.  The patient tolerated the procedure well.   Earnstine Regal, MD, [facility] Office: [phone]

## 2015-08-29 NOTE — Anesthesia Postprocedure Evaluation (Signed)
Anesthesia Post Note  Patient: Veronica [name]  Procedure(s) Performed: Procedure(s) (LRB):  TOTAL THYROIDECTOMY (N/A)  Patient location during evaluation: PACU Anesthesia Type: General Level of consciousness: awake and alert Pain management: pain level controlled Vital Signs Assessment: post-procedure vital signs reviewed and stable Respiratory status: spontaneous breathing, nonlabored ventilation, respiratory function stable and patient connected to nasal cannula oxygen Cardiovascular status: blood pressure returned to baseline and stable Postop Assessment: no signs of nausea or vomiting Anesthetic complications: no    Last Vitals:  Filed Vitals:   08/29/15 1632 08/29/15 1700  BP: 156/91 164/68  Pulse: 79 74  Temp:  36.8 C  Resp: 13 16    Last Pain:  Filed Vitals:   08/29/15 1809  PainSc: 8                  Deano Tomaszewski A

## 2015-08-29 NOTE — Transfer of Care (Signed)
Immediate Anesthesia Transfer of Care Note  Patient: Veronica [name]  Procedure(s) Performed: Procedure(s):  TOTAL THYROIDECTOMY (N/A)  Patient Location: PACU  Anesthesia Type:General  Level of Consciousness: awake and oriented  Airway & Oxygen Therapy: Patient Spontanous Breathing and Patient connected to nasal cannula oxygen  Post-op Assessment: Report given to RN  Post vital signs: Reviewed and stable  Last Vitals:  Filed Vitals:   08/29/15 1142 08/29/15 1546  BP: 148/61   Pulse:    Temp:  36.9 C  Resp:      Last Pain: There were no vitals filed for this visit.    Patients Stated Pain Goal: 2 ([id number])  Complications: No apparent anesthesia complications

## 2015-08-30 DIAGNOSIS — D34 Benign neoplasm of thyroid gland: Secondary | ICD-10-CM | POA: Diagnosis not present

## 2015-08-30 LAB — BASIC METABOLIC PANEL
Anion gap: 8 (ref 5–15)
CALCIUM: 8 mg/dL — AB (ref 8.9–10.3)
CO2: 30 mmol/L (ref 22–32)
CREATININE: 0.47 mg/dL (ref 0.44–1.00)
Chloride: 102 mmol/L (ref 101–111)
GFR calc Af Amer: >60 mL/min (ref >60)
GLUCOSE: 91 mg/dL (ref 65–99)
Potassium: 4.2 mmol/L (ref 3.5–5.1)
Sodium: 140 mmol/L (ref 135–145)

## 2015-08-30 MED ORDER — HYDROCODONE-ACETAMINOPHEN 5-325 MG PO TABS: 1.0000 | ORAL_TABLET | ORAL | Status: DC | PRN

## 2015-08-30 MED ORDER — CYCLOBENZAPRINE HCL 10 MG PO TABS
10.0000 mg | ORAL_TABLET | Freq: Three times a day (TID) | ORAL | Status: DC | PRN
  Administered 2015-08-31: 10 mg via ORAL
  Filled 2015-08-30 (×2): qty 1

## 2015-08-30 MED ORDER — CALCIUM CARBONATE-VITAMIN D 500-200 MG-UNIT PO TABS: 2.0000 | ORAL_TABLET | Freq: Three times a day (TID) | ORAL | Status: DC

## 2015-08-30 MED ORDER — CALCIUM GLUCONATE 10 % IV SOLN
1.0000 g | INTRAVENOUS | Status: AC
  Administered 2015-08-30: 1 g via INTRAVENOUS
  Filled 2015-08-30: qty 10

## 2015-08-30 MED ORDER — PHENOL 1.4 % MT LIQD
1.0000 | OROMUCOSAL | Status: DC | PRN
  Filled 2015-08-30 (×2): qty 177

## 2015-08-30 MED ORDER — LEVOTHYROXINE SODIUM 88 MCG PO TABS: 88.0000 ug | ORAL_TABLET | Freq: Every day | ORAL | Status: DC

## 2015-08-30 NOTE — Discharge Summary (Addendum)
Physician Discharge Summary  Patient ID: Veronica [name] MRN: [id number] DOB/AGE: [birth year] 65 y.o.  Admit date: 08/29/2015 Discharge date: 08/30/2015  Admission Diagnoses:s/p total thyroidectomy  Discharge Diagnoses:  Principal Problem:   Neoplasm of uncertain behavior of thyroid gland   Discharged Condition: good  Hospital Course: Pt was admitted post op.  Pt did well overnight and pain was controlled.  She was able to tol liquids well.  He Ca POD 1 was slightly low and corrected with Ca gluconate admin.  Pt was afebrile, ambulating well and deemed stable for DC and Dc'd home.  Consults: None  Significant Diagnostic Studies: none  Treatments: surgery: asa bove  Discharge Exam: Blood pressure 132/43, pulse 62, temperature 98.4 F (36.9 C), temperature source Oral, resp. rate 15, height 5\' 5"  (1.651 m), weight 52.2 kg (115 lb 1.3 oz), SpO2 100 %. General appearance: alert and cooperative Neck: incision c/d/i GI: soft, non-tender; bowel sounds normal; no masses,  no organomegaly  Disposition: 01-Home or Self Care  Discharge Instructions    Diet - low sodium heart healthy    Complete by:  As directed      Increase activity slowly    Complete by:  As directed             Medication List    TAKE these medications        acetaminophen-codeine 300-30 MG tablet  Commonly known as:  TYLENOL #3  TAKE 1 TABLET BY MOUTH EVERY 4 HOURS AS NEEDED     calcium-vitamin D 500-200 MG-UNIT tablet  Commonly known as:  OSCAL WITH D  Take 2 tablets by mouth 3 (three) times daily.     Cholecalciferol 1000 units tablet  Take 1,000 Units by mouth daily.     clonazePAM 1 MG tablet  Commonly known as:  KLONOPIN  2 po qhs and 1/2 tab in addition in daytime prn     cyanocobalamin 1000 MCG/ML injection  Commonly known as:  (VITAMIN B-12)  Inject 1 mL (1,000 mcg total) into the muscle every 14 (fourteen) days.     cyclobenzaprine 10 MG tablet  Commonly known as:  FLEXERIL  Take 1  tablet (10 mg total) by mouth 3 (three) times daily as needed.     escitalopram 20 MG tablet  Commonly known as:  LEXAPRO  Take 20 mg by mouth daily.     Fish Oil 1000 MG Caps  Take 1,000 mg by mouth 2 (two) times daily.     furosemide 20 MG tablet  Commonly known as:  LASIX  Take 1 tablet (20 mg total) by mouth daily.     HYDROcodone-acetaminophen 5-325 MG tablet  Commonly known as:  NORCO/VICODIN  Take 1 tablet by mouth every 6 (six) hours as needed for moderate pain. Reported on 06/11/2015     HYDROcodone-acetaminophen 5-325 MG tablet  Commonly known as:  NORCO/VICODIN  Take 1-2 tablets by mouth every 4 (four) hours as needed for moderate pain.     Insulin Syringe-Needle U-100 26G X 1/2" 1 ML Misc  Commonly known as:  B-D INSULIN SYRINGE 1CC/26G  Inject 1 each into the skin as directed.     Ipratropium-Albuterol 20-100 MCG/ACT Aers respimat  Commonly known as:  COMBIVENT RESPIMAT  Inhale 1 puff into the lungs every 6 (six) hours as needed for wheezing or shortness of breath.     levothyroxine 88 MCG tablet  Commonly known as:  SYNTHROID, LEVOTHROID  Take 1 tablet (88 mcg total) by mouth daily  before breakfast.     magic mouthwash Soln  Take 5 mLs by mouth 4 (four) times daily. Swish, hold and swallow four times daily.     phenytoin 100 MG ER capsule  Commonly known as:  DILANTIN  Take 100-200 mg by mouth 2 (two) times daily. 200 mg every morning and 100 mg every evening, DILANTIN "brand name" only     pravastatin 80 MG tablet  Commonly known as:  PRAVACHOL  TAKE 1 TABLET BY MOUTH EVERY DAY     promethazine 25 MG tablet  Commonly known as:  PHENERGAN  Take 0.5 tablets (12.5 mg total) by mouth every 6 (six) hours as needed.     promethazine-codeine 6.25-10 MG/5ML syrup  Commonly known as:  PHENERGAN with CODEINE  Take 5 mLs by mouth every 6 (six) hours as needed for cough.     sodium chloride 5 % ophthalmic solution  Commonly known as:  MURO 128  Place 1 drop  into both eyes as needed for irritation.     STOOL SOFTENER PO  Take 100 mg by mouth as needed (for constipation).     traZODone 50 MG tablet  Commonly known as:  DESYREL  TAKE 4 TABLETS BY MOUTH AT BEDTIME     verapamil 240 MG CR tablet  Commonly known as:  CALAN-SR  Take 240 mg by mouth at bedtime.           Follow-up Information    Follow up with Earnstine Regal, MD. Schedule an appointment as soon as possible for a visit in 2 weeks.   Specialty:  General Surgery   Why:  For wound re-check   Contact information:   [place] [id number] [phone]       Signed: Rosario Jacks., Anne Hahn 08/30/2015, 8:00 AM    Addendum: Pt stayed due to throat soreness.  Feels better this  Am and is ready for DC.

## 2015-08-30 NOTE — Discharge Instructions (Signed)
Thyroidectomy, Care After °Refer to this sheet in the next few weeks. These instructions provide you with information about caring for yourself after your procedure. Your health care provider may also give you more specific instructions. Your treatment has been planned according to current medical practices, but problems sometimes occur. Call your health care provider if you have any problems or questions after your procedure. °WHAT TO EXPECT AFTER THE PROCEDURE °After your procedure, it is typical to have: °· Mild pain in the neck or upper body, especially when swallowing. °· A sore throat. °· A weak voice. °HOME CARE INSTRUCTIONS  °· Take medicines only as directed by your health care provider. °· If your entire thyroid gland was removed, you may need to take thyroid hormone medicine from now on. °· Do not take medicines that contain aspirin and ibuprofen until your health care provider says that you can. These medicines can increase your risk of bleeding. °· Some pain medicines cause constipation. Drink enough fluid to keep your urine clear or pale yellow. This can help to prevent constipation. °· Start slowly with eating. You may need to have only liquids and soft foods for a few days or as directed by your health care provider. °· Do not take baths, swim, or use a hot tub until your health care provider approves. °· There are many different ways to close and cover an incision, including stitches (sutures), skin glue, and adhesive strips. Follow your health care provider's instructions for: °¨ Incision care. °¨ Bandage (dressing) changes and removal. °¨ Incision closure removal. °· Resume your usual activities as directed by your health care provider. °· For the first 10 days after the procedure or as instructed by your health care provider: °¨ Do not lift anything heavier than 20 lb (9.1 kg). °¨ Do not jog, swim, or do other strenuous exercises. °¨ Do not play contact sports. °· Keep all follow-up visits as  directed by your health care provider. This is important. °SEEK MEDICAL CARE IF: °· The soreness in your throat gets worse. °· You have increased pain at your incision or incisions. °· You have increased bleeding from an incision. °· Your incision becomes infected. Watch for: °¨ Swelling. °¨ Redness. °¨ Warmth. °¨ Pus. °· You notice a bad smell coming from an incision or dressing. °· You have a fever. °· You feel lightheaded or faint. °· You have numbness, tingling, or muscle spasms in your: °¨ Arms. °¨ Hands. °¨ Feet. °¨ Face. °· You have trouble swallowing. °SEEK IMMEDIATE MEDICAL CARE IF:  °· You develop a rash. °· You have difficulty breathing. °· You hear whistling noises coming from your chest. °· You develop a cough that gets worse. °· Your speech changes, or you have hoarseness that gets worse. °  °This information is not intended to replace advice given to you by your health care provider. Make sure you discuss any questions you have with your health care provider. °  °Document Released: 09/18/2004 Document Revised: 03/22/2014 Document Reviewed: 08/01/2013 °Elsevier Interactive Patient Education ©2016 [employer] ° °

## 2015-08-30 NOTE — Progress Notes (Signed)
Pt having difficulty swallowing and not able to swallow pills.  Ptdoes not feel comfortable with going home alone.  Would prefer to stay an extra night.  Notified MD.  MD aware and asked that discharged order be cancelled.  Order carried out.  Will continue to monitor.

## 2015-08-31 DIAGNOSIS — D34 Benign neoplasm of thyroid gland: Secondary | ICD-10-CM | POA: Diagnosis not present

## 2015-08-31 LAB — BASIC METABOLIC PANEL
ANION GAP: 7 (ref 5–15)
BUN: 6 mg/dL (ref 6–20)
CALCIUM: 8.6 mg/dL — AB (ref 8.9–10.3)
CHLORIDE: 98 mmol/L — AB (ref 101–111)
CO2: 34 mmol/L — AB (ref 22–32)
Creatinine, Ser: 0.55 mg/dL (ref 0.44–1.00)
GFR calc non Af Amer: >60 mL/min (ref >60)
GLUCOSE: 80 mg/dL (ref 65–99)
POTASSIUM: 3.7 mmol/L (ref 3.5–5.1)
Sodium: 139 mmol/L (ref 135–145)

## 2015-08-31 MED ORDER — CYCLOBENZAPRINE HCL 10 MG PO TABS: 10.0000 mg | ORAL_TABLET | Freq: Three times a day (TID) | ORAL | Status: DC | PRN

## 2015-08-31 NOTE — Progress Notes (Signed)
Discussed discharge summary with patient. Reviewed all medications with patient. Patient received Rx. Patient ready for discharge. 

## 2015-09-01 ENCOUNTER — Encounter (HOSPITAL_COMMUNITY): Payer: Self-pay | Admitting: Surgery

## 2015-09-01 NOTE — Progress Notes (Signed)
Quick Note:  Please contact patient and notify of benign pathology results.  Zayden Maffei M. Jazyiah Yiu, MD, FACS [facility] Office: [phone]   ______ 

## 2015-10-07 ENCOUNTER — Ambulatory Visit (INDEPENDENT_AMBULATORY_CARE_PROVIDER_SITE_OTHER): Payer: Medicare Other | Admitting: Internal Medicine

## 2015-10-07 ENCOUNTER — Other Ambulatory Visit: Payer: Self-pay | Admitting: *Deleted

## 2015-10-07 ENCOUNTER — Encounter: Payer: Self-pay | Admitting: Internal Medicine

## 2015-10-07 ENCOUNTER — Other Ambulatory Visit (INDEPENDENT_AMBULATORY_CARE_PROVIDER_SITE_OTHER): Payer: Medicare Other

## 2015-10-07 VITALS — BP 134/80 | HR 72 | Wt 120.0 lb

## 2015-10-07 DIAGNOSIS — Z23 Encounter for immunization: Secondary | ICD-10-CM | POA: Diagnosis not present

## 2015-10-07 DIAGNOSIS — L509 Urticaria, unspecified: Secondary | ICD-10-CM

## 2015-10-07 DIAGNOSIS — I10 Essential (primary) hypertension: Secondary | ICD-10-CM

## 2015-10-07 DIAGNOSIS — R9389 Abnormal findings on diagnostic imaging of other specified body structures: Secondary | ICD-10-CM

## 2015-10-07 DIAGNOSIS — E559 Vitamin D deficiency, unspecified: Secondary | ICD-10-CM

## 2015-10-07 DIAGNOSIS — R938 Abnormal findings on diagnostic imaging of other specified body structures: Secondary | ICD-10-CM | POA: Diagnosis not present

## 2015-10-07 DIAGNOSIS — E875 Hyperkalemia: Secondary | ICD-10-CM

## 2015-10-07 DIAGNOSIS — E538 Deficiency of other specified B group vitamins: Secondary | ICD-10-CM

## 2015-10-07 DIAGNOSIS — E041 Nontoxic single thyroid nodule: Secondary | ICD-10-CM

## 2015-10-07 DIAGNOSIS — IMO0001 Reserved for inherently not codable concepts without codable children: Secondary | ICD-10-CM

## 2015-10-07 DIAGNOSIS — J449 Chronic obstructive pulmonary disease, unspecified: Secondary | ICD-10-CM | POA: Diagnosis not present

## 2015-10-07 DIAGNOSIS — J4540 Moderate persistent asthma, uncomplicated: Secondary | ICD-10-CM

## 2015-10-07 DIAGNOSIS — E539 Vitamin B deficiency, unspecified: Secondary | ICD-10-CM

## 2015-10-07 DIAGNOSIS — J42 Unspecified chronic bronchitis: Secondary | ICD-10-CM

## 2015-10-07 LAB — BASIC METABOLIC PANEL
BUN: 8 mg/dL (ref 6–23)
CHLORIDE: 100 meq/L (ref 96–112)
CO2: 32 meq/L (ref 19–32)
CREATININE: 0.59 mg/dL (ref 0.40–1.20)
Calcium: 9 mg/dL (ref 8.4–10.5)
GFR: 108.21 mL/min (ref >60.00)
GLUCOSE: 78 mg/dL (ref 70–99)
POTASSIUM: 4 meq/L (ref 3.5–5.1)
Sodium: 138 mEq/L (ref 135–145)

## 2015-10-07 MED ORDER — PROMETHAZINE HCL 25 MG PO TABS
12.5000 mg | ORAL_TABLET | Freq: Four times a day (QID) | ORAL | 1 refills | Status: DC | PRN
Start: 2015-10-07 — End: 2019-07-10

## 2015-10-07 MED ORDER — CYANOCOBALAMIN 1000 MCG/ML IJ SOLN: 1000.0000 ug | INTRAMUSCULAR | 11 refills | Status: DC

## 2015-10-07 MED ORDER — ACETAMINOPHEN-CODEINE #3 300-30 MG PO TABS: 1.0000 | ORAL_TABLET | Freq: Four times a day (QID) | ORAL | 3 refills | Status: DC | PRN

## 2015-10-07 MED ORDER — FUROSEMIDE 20 MG PO TABS: 20.0000 mg | ORAL_TABLET | Freq: Every day | ORAL | 2 refills | Status: DC

## 2015-10-07 MED ORDER — PROMETHAZINE-CODEINE 6.25-10 MG/5ML PO SYRP: 5.0000 mL | ORAL_SOLUTION | Freq: Four times a day (QID) | ORAL | 0 refills | Status: DC | PRN

## 2015-10-07 MED ORDER — CYCLOBENZAPRINE HCL 10 MG PO TABS: 10.0000 mg | ORAL_TABLET | Freq: Three times a day (TID) | ORAL | 0 refills | Status: DC | PRN

## 2015-10-07 MED ORDER — "INSULIN SYRINGE-NEEDLE U-100 26G X 1/2"" 1 ML MISC": 1.0000 | 5 refills | Status: DC

## 2015-10-07 MED ORDER — IPRATROPIUM-ALBUTEROL 20-100 MCG/ACT IN AERS: 1.0000 | INHALATION_SPRAY | Freq: Four times a day (QID) | RESPIRATORY_TRACT | 11 refills | Status: DC | PRN

## 2015-10-07 MED ORDER — MAGIC MOUTHWASH: 5.0000 mL | Freq: Four times a day (QID) | ORAL | 3 refills | Status: DC

## 2015-10-07 NOTE — Progress Notes (Signed)
Subjective:  Patient ID: Veronica [name], female    DOB: 16-Aug-1950  Age: 65 y.o. MRN: [id number]  CC: No chief complaint on file.   HPI KEEGHAN ANHORN presents for a s/p thyroidectomy f/u, cough, dyslipidemia f/u  Outpatient Medications Prior to Visit  Medication Sig Dispense Refill  . acetaminophen-codeine (TYLENOL #3) 300-30 MG tablet TAKE 1 TABLET BY MOUTH EVERY 4 HOURS AS NEEDED (Patient taking differently: TAKE 1 TABLET BY MOUTH EVERY 4 HOURS AS NEEDED for migraines) 60 tablet 3  . Alum & Mag Hydroxide-Simeth (MAGIC MOUTHWASH) SOLN Take 5 mLs by mouth 4 (four) times daily. Swish, hold and swallow four times daily. (Patient taking differently: Take 5 mLs by mouth daily as needed (for thrush). Swish, hold and swallow.) 300 mL 3  . Cholecalciferol 1000 units tablet Take 1,000 Units by mouth daily.    . clonazePAM (KLONOPIN) 1 MG tablet 2 po qhs and 1/2 tab in addition in daytime prn (Patient taking differently: Take 0.5-2 mg by mouth 2 (two) times daily as needed for anxiety. 2 po qhs and 1/2 tab in addition in daytime prn) 225 tablet 1  . cyanocobalamin (,VITAMIN B-12,) 1000 MCG/ML injection Inject 1 mL (1,000 mcg total) into the muscle every 14 (fourteen) days. 6 mL 11  . cyclobenzaprine (FLEXERIL) 10 MG tablet Take 1 tablet (10 mg total) by mouth 3 (three) times daily as needed for muscle spasms. 30 tablet 0  . Docusate Calcium (STOOL SOFTENER PO) Take 100 mg by mouth as needed (for constipation).     [name] escitalopram (LEXAPRO) 20 MG tablet Take 20 mg by mouth daily.   3  . furosemide (LASIX) 20 MG tablet Take 1 tablet (20 mg total) by mouth daily. 30 tablet 2  . HYDROcodone-acetaminophen (NORCO/VICODIN) 5-325 MG per tablet Take 1 tablet by mouth every 6 (six) hours as needed for moderate pain. Reported on 06/11/2015  0  . HYDROcodone-acetaminophen (NORCO/VICODIN) 5-325 MG tablet Take 1-2 tablets by mouth every 4 (four) hours as needed for moderate pain. 30 tablet 0  . Insulin  Syringe-Needle U-100 (B-D INSULIN SYRINGE 1CC/26G) 26G X 1/2" 1 ML MISC Inject 1 each into the skin as directed. 100 each 5  . Ipratropium-Albuterol (COMBIVENT RESPIMAT) 20-100 MCG/ACT AERS respimat Inhale 1 puff into the lungs every 6 (six) hours as needed for wheezing or shortness of breath. 1 Inhaler 11  . levothyroxine (SYNTHROID, LEVOTHROID) 88 MCG tablet Take 1 tablet (88 mcg total) by mouth daily before breakfast. 30 tablet 1  . Omega-3 Fatty Acids (FISH OIL) 1000 MG CAPS Take 1,000 mg by mouth 2 (two) times daily.     . phenytoin (DILANTIN) 100 MG ER capsule Take 100-200 mg by mouth 2 (two) times daily. 200 mg every morning and 100 mg every evening, DILANTIN "brand name" only    . pravastatin (PRAVACHOL) 80 MG tablet TAKE 1 TABLET BY MOUTH EVERY DAY 90 tablet 3  . promethazine (PHENERGAN) 25 MG tablet Take 0.5 tablets (12.5 mg total) by mouth every 6 (six) hours as needed. (Patient taking differently: Take 12.5 mg by mouth every 6 (six) hours as needed for nausea. ) 60 tablet 1  . promethazine-codeine (PHENERGAN WITH CODEINE) 6.25-10 MG/5ML syrup Take 5 mLs by mouth every 6 (six) hours as needed for cough. 300 mL 0  . sodium chloride (MURO 128) 5 % ophthalmic solution Place 1 drop into both eyes as needed for irritation.     . traZODone (DESYREL) 50 MG tablet TAKE 4  TABLETS BY MOUTH AT BEDTIME (Patient taking differently: TAKE 4 TABLETS (200 mg)  BY MOUTH AT BEDTIME) 360 tablet 3  . verapamil (CALAN-SR) 240 MG CR tablet Take 240 mg by mouth at bedtime.   5  . cyclobenzaprine (FLEXERIL) 10 MG tablet Take 1 tablet (10 mg total) by mouth 3 (three) times daily as needed. (Patient taking differently: Take 10 mg by mouth daily as needed for muscle spasms. ) 90 tablet 3  . calcium-vitamin D (OSCAL WITH D) 500-200 MG-UNIT tablet Take 2 tablets by mouth 3 (three) times daily. (Patient not taking: Reported on 10/07/2015) 90 tablet 2   No facility-administered medications prior to visit.     ROS Review  of Systems  Constitutional: Positive for fatigue and unexpected weight change. Negative for activity change, appetite change and chills.  HENT: Positive for congestion, trouble swallowing and voice change. Negative for mouth sores and sinus pressure.   Eyes: Negative for visual disturbance.  Respiratory: Positive for cough, shortness of breath and wheezing. Negative for chest tightness.   Cardiovascular: Negative for leg swelling.  Gastrointestinal: Positive for abdominal distention. Negative for abdominal pain and nausea.  Genitourinary: Negative for difficulty urinating, frequency and vaginal pain.  Musculoskeletal: Negative for back pain and gait problem.  Skin: Negative for pallor and rash.  Neurological: Negative for dizziness, tremors, weakness, numbness and headaches.  Psychiatric/Behavioral: Negative for confusion and sleep disturbance. The patient is nervous/anxious.     Objective:  BP 134/80   Pulse 72   Wt 120 lb (54.4 kg)   SpO2 97%   BMI 19.97 kg/m   BP Readings from Last 3 Encounters:  10/07/15 134/80  08/31/15 (!) 123/44  08/21/15 (!) 154/67    Wt Readings from Last 3 Encounters:  10/07/15 120 lb (54.4 kg)  08/29/15 115 lb 1.3 oz (52.2 kg)  08/21/15 115 lb 4 oz (52.3 kg)    Physical Exam  Constitutional: She appears well-developed. No distress.  HENT:  Head: Normocephalic.  Right Ear: External ear normal.  Left Ear: External ear normal.  Nose: Nose normal.  Mouth/Throat: Oropharynx is clear and moist.  Eyes: Conjunctivae are normal. Pupils are equal, round, and reactive to light. Right eye exhibits no discharge. Left eye exhibits no discharge.  Neck: Normal range of motion. Neck supple. No JVD present. No tracheal deviation present. No thyromegaly present.  Cardiovascular: Normal rate, regular rhythm and normal heart sounds.   Pulmonary/Chest: No stridor. No respiratory distress. She has no wheezes.  Abdominal: Soft. Bowel sounds are normal. She exhibits  no distension and no mass. There is no tenderness. There is no rebound and no guarding.  Musculoskeletal: She exhibits no edema or tenderness.  Lymphadenopathy:    She has no cervical adenopathy.  Neurological: She displays normal reflexes. No cranial nerve deficit. She exhibits normal muscle tone. Coordination abnormal.  Skin: No rash noted. No erythema.  Psychiatric: She has a normal mood and affect. Her behavior is normal. Judgment and thought content normal.  Deep voice Coughing all the time Neck scar is well healed  Lab Results  Component Value Date   WBC 8.8 08/21/2015   HGB 14.2 08/21/2015   HCT 44.3 08/21/2015   PLT 237 08/21/2015   GLUCOSE 80 08/31/2015   CHOL 242 (H) 12/06/2014   TRIG 98.0 12/06/2014   HDL 69.60 12/06/2014   LDLDIRECT 165.0 09/26/2009   LDLCALC 153 (H) 12/06/2014   ALT 11 08/01/2015   AST 17 08/01/2015   NA 139 08/31/2015  K 3.7 08/31/2015   CL 98 (L) 08/31/2015   CREATININE 0.55 08/31/2015   BUN 6 08/31/2015   CO2 34 (H) 08/31/2015   TSH 1.49 08/01/2015   INR 1.0 ratio 11/07/2009    Dg Chest 2 View  Result Date: 08/21/2015 CLINICAL DATA:  Preop, new onset cough with fever. EXAM: CHEST  2 VIEW COMPARISON:  06/01/2011. FINDINGS: Trachea is midline. Heart size normal. Lungs are hyperinflated but clear. There may be a new nodular airspace opacification in the right middle lobe. Lungs are otherwise clear. No pleural fluid. IMPRESSION: Possible nodular airspace opacification in the right middle lobe may be due to pneumonia. Followup PA and lateral chest X-ray is recommended in 3-4 weeks following trial of antibiotic therapy to ensure resolution and exclude underlying malignancy. Electronically Signed   By: Lorin Picket M.D.   On: 08/21/2015 16:50    Assessment & Plan:   Diagnoses and all orders for this visit:  Urticaria -     cyanocobalamin (,VITAMIN B-12,) 1000 MCG/ML injection; Inject 1 mL (1,000 mcg total) into the muscle every 14 (fourteen)  days.  COPD bronchitis -     cyanocobalamin (,VITAMIN B-12,) 1000 MCG/ML injection; Inject 1 mL (1,000 mcg total) into the muscle every 14 (fourteen) days.  HYPERKALEMIA -     cyanocobalamin (,VITAMIN B-12,) 1000 MCG/ML injection; Inject 1 mL (1,000 mcg total) into the muscle every 14 (fourteen) days. -     promethazine (PHENERGAN) 25 MG tablet; Take 0.5 tablets (12.5 mg total) by mouth every 6 (six) hours as needed.  B12 deficiency -     cyanocobalamin (,VITAMIN B-12,) 1000 MCG/ML injection; Inject 1 mL (1,000 mcg total) into the muscle every 14 (fourteen) days.  Vitamin D deficiency -     cyanocobalamin (,VITAMIN B-12,) 1000 MCG/ML injection; Inject 1 mL (1,000 mcg total) into the muscle every 14 (fourteen) days. -     promethazine (PHENERGAN) 25 MG tablet; Take 0.5 tablets (12.5 mg total) by mouth every 6 (six) hours as needed.  URTICARIA -     promethazine (PHENERGAN) 25 MG tablet; Take 0.5 tablets (12.5 mg total) by mouth every 6 (six) hours as needed.  Chronic bronchitis, unspecified chronic bronchitis type (HCC) -     promethazine (PHENERGAN) 25 MG tablet; Take 0.5 tablets (12.5 mg total) by mouth every 6 (six) hours as needed.  Vitamin B-complex deficiency -     promethazine (PHENERGAN) 25 MG tablet; Take 0.5 tablets (12.5 mg total) by mouth every 6 (six) hours as needed.  Other orders -     promethazine-codeine (PHENERGAN WITH CODEINE) 6.25-10 MG/5ML syrup; Take 5 mLs by mouth every 6 (six) hours as needed for cough. -     acetaminophen-codeine (TYLENOL #3) 300-30 MG tablet; Take 1 tablet by mouth every 4 (four) hours as needed. -     magic mouthwash SOLN; Take 5 mLs by mouth 4 (four) times daily. Swish, hold and swallow four times daily. -     furosemide (LASIX) 20 MG tablet; Take 1 tablet (20 mg total) by mouth daily. -     Ipratropium-Albuterol (COMBIVENT RESPIMAT) 20-100 MCG/ACT AERS respimat; Inhale 1 puff into the lungs every 6 (six) hours as needed for wheezing or  shortness of breath. -     cyclobenzaprine (FLEXERIL) 10 MG tablet; Take 1 tablet (10 mg total) by mouth 3 (three) times daily as needed for muscle spasms.   I am having Ms. Buell maintain her phenytoin, Fish Oil, magic mouthwash, promethazine, traZODone, Docusate  Calcium (STOOL SOFTENER PO), pravastatin, clonazePAM, Insulin Syringe-Needle U-100, verapamil, HYDROcodone-acetaminophen, Ipratropium-Albuterol, cyanocobalamin, escitalopram, Cholecalciferol, acetaminophen-codeine, sodium chloride, furosemide, promethazine-codeine, HYDROcodone-acetaminophen, calcium-vitamin D, levothyroxine, and cyclobenzaprine.  No orders of the defined types were placed in this encounter.    Follow-up: No Follow-up on file.  Walker Kehr, MD

## 2015-10-07 NOTE — Assessment & Plan Note (Addendum)
Pulmicort, Combivent Prom-cod syr Chest CT D/c fish oil

## 2015-10-07 NOTE — Assessment & Plan Note (Signed)
Veronica [name] 

## 2015-10-07 NOTE — Addendum Note (Signed)
Addended by: Cresenciano Lick on: 10/07/2015 04:58 PM   Modules accepted: Orders

## 2015-10-07 NOTE — Progress Notes (Signed)
Pre visit review using our clinic review tool, if applicable. No additional management support is needed unless otherwise documented below in the visit note. 

## 2015-10-07 NOTE — Assessment & Plan Note (Signed)
Dr Harlow Asa Thyroidectomy s/p 08/29/15

## 2015-10-07 NOTE — Assessment & Plan Note (Addendum)
Pulmicort, Combivent Prom-cod syr prn CT chest due to abn CXR D/c fish oil

## 2015-10-08 ENCOUNTER — Telehealth: Payer: Self-pay

## 2015-10-08 ENCOUNTER — Telehealth: Payer: Self-pay | Admitting: Internal Medicine

## 2015-10-08 ENCOUNTER — Telehealth: Payer: Self-pay | Admitting: Emergency Medicine

## 2015-10-08 MED ORDER — MAGIC MOUTHWASH: 5.0000 mL | Freq: Four times a day (QID) | ORAL | 3 refills | Status: DC

## 2015-10-08 NOTE — Telephone Encounter (Signed)
Per dr Alain Marion, we keeping rx for acetaminophen-codeine, eliminating rx for promethazine-codeine---changing insulin needles from 26g to 27g----and magic mouthwash is NOT to be swallowed and containing compounds of nystatin, benadryl and dexamethazone----I have advised diane/pharmacist at [facility] and also patient who walked into office

## 2015-10-08 NOTE — Telephone Encounter (Signed)
[facility] has question about patients prescriptions.  They cant fill acetaminophen-codeine (TYLENOL #3) 300-30 MG tablet, promethazine (PHENERGAN) 25 MG tablet and promethazine-codeine (PHENERGAN WITH CODEINE) 6.25-10 MG/5ML syrup since more than one of them have the same ingredient. Also they need to know the ingredients you would like in the magic mouthwash SOLN. Lastly they wanted to know if they could sub a different size syringe since they dont have Insulin Syringe-Needle U-100 (B-D INSULIN SYRINGE 1CC/26G) 26G X 1/2" 1 ML MISC in stock. Please follow up thanks.

## 2015-10-08 NOTE — Telephone Encounter (Signed)
Error

## 2015-10-08 NOTE — Telephone Encounter (Signed)
  We have addressed all - correct? Thx

## 2015-10-08 NOTE — Telephone Encounter (Signed)
Patient walked in. To clarify on the below...   -I called [facility], and [facility] is advising that they are not going to fill 2 different codeine prescriptions. She states that we need to make a choice of one or the other or something else all together.   - they have a 27 gauge needle for the insulin in stock, but not a 26. She wants to make sure that we are ok with giving the 27 gauge  -the magic mouthwash is nothing but maalox in the way we wrote it. She advised that usually there is somnething else.

## 2015-10-08 NOTE — Telephone Encounter (Signed)
260 ml of magic mouthwash solution.   100 ml of dexamethasone 0.5 mg per 5 ml elixir 60 ml nystatin 100,000 Unit 100 ml of diphenhydramine 12.5 mg per 5 ml elixir  Contacted the pharmacy with verbal okay of the above ingredients for the magic mouth wash.   Pharmacy stated that it would be ready after 6: 30 pm.   Veronica [name] relayed information to patient.   Medication list updated with ingredients for the mouth wash.

## 2015-10-08 NOTE — Telephone Encounter (Signed)
Veronica [name] :Spoke to pt regarding her labs and I mailed them to her.  No need to call her

## 2015-10-09 ENCOUNTER — Other Ambulatory Visit: Payer: Self-pay | Admitting: *Deleted

## 2015-10-09 MED ORDER — CYCLOBENZAPRINE HCL 10 MG PO TABS: 10.0000 mg | ORAL_TABLET | Freq: Three times a day (TID) | ORAL | 0 refills | Status: DC | PRN

## 2015-10-09 NOTE — Telephone Encounter (Signed)
Yes, Thanks.

## 2015-10-10 ENCOUNTER — Ambulatory Visit (INDEPENDENT_AMBULATORY_CARE_PROVIDER_SITE_OTHER)
Admission: RE | Admit: 2015-10-10 | Discharge: 2015-10-10 | Disposition: A | Discharge status: Home / Self Care | Payer: Medicare Other | Source: Ambulatory Visit | Attending: Internal Medicine | Admitting: Internal Medicine

## 2015-10-10 DIAGNOSIS — J449 Chronic obstructive pulmonary disease, unspecified: Secondary | ICD-10-CM

## 2015-10-10 DIAGNOSIS — R938 Abnormal findings on diagnostic imaging of other specified body structures: Secondary | ICD-10-CM | POA: Diagnosis not present

## 2015-10-10 MED ORDER — IOPAMIDOL (ISOVUE-300) INJECTION 61%
80.0000 mL | Freq: Once | INTRAVENOUS | Status: AC | PRN
  Administered 2015-10-10: 80 mL via INTRAVENOUS

## 2015-11-18 ENCOUNTER — Ambulatory Visit: Payer: Medicare Other | Admitting: Internal Medicine

## 2015-11-19 ENCOUNTER — Ambulatory Visit (INDEPENDENT_AMBULATORY_CARE_PROVIDER_SITE_OTHER): Payer: Medicare Other | Admitting: Internal Medicine

## 2015-11-19 ENCOUNTER — Other Ambulatory Visit (INDEPENDENT_AMBULATORY_CARE_PROVIDER_SITE_OTHER): Payer: Medicare Other

## 2015-11-19 ENCOUNTER — Encounter: Payer: Self-pay | Admitting: Internal Medicine

## 2015-11-19 VITALS — BP 140/78 | HR 61 | Wt 122.0 lb

## 2015-11-19 DIAGNOSIS — E538 Deficiency of other specified B group vitamins: Secondary | ICD-10-CM

## 2015-11-19 DIAGNOSIS — E875 Hyperkalemia: Secondary | ICD-10-CM | POA: Diagnosis not present

## 2015-11-19 DIAGNOSIS — R635 Abnormal weight gain: Secondary | ICD-10-CM

## 2015-11-19 DIAGNOSIS — M159 Polyosteoarthritis, unspecified: Secondary | ICD-10-CM

## 2015-11-19 DIAGNOSIS — I10 Essential (primary) hypertension: Secondary | ICD-10-CM

## 2015-11-19 DIAGNOSIS — E890 Postprocedural hypothyroidism: Secondary | ICD-10-CM

## 2015-11-19 DIAGNOSIS — G8929 Other chronic pain: Secondary | ICD-10-CM

## 2015-11-19 DIAGNOSIS — E039 Hypothyroidism, unspecified: Secondary | ICD-10-CM | POA: Insufficient documentation

## 2015-11-19 DIAGNOSIS — R609 Edema, unspecified: Secondary | ICD-10-CM

## 2015-11-19 DIAGNOSIS — M545 Low back pain: Secondary | ICD-10-CM

## 2015-11-19 DIAGNOSIS — M150 Primary generalized (osteo)arthritis: Secondary | ICD-10-CM

## 2015-11-19 LAB — BASIC METABOLIC PANEL
BUN: 8 mg/dL (ref 6–23)
CALCIUM: 8.4 mg/dL (ref 8.4–10.5)
CO2: 35 mEq/L — ABNORMAL HIGH (ref 19–32)
Chloride: 103 mEq/L (ref 96–112)
Creatinine, Ser: 0.54 mg/dL (ref 0.40–1.20)
GFR: 119.81 mL/min (ref >60.00)
Glucose, Bld: 83 mg/dL (ref 70–99)
POTASSIUM: 4.4 meq/L (ref 3.5–5.1)
SODIUM: 140 meq/L (ref 135–145)

## 2015-11-19 MED ORDER — FUROSEMIDE 20 MG PO TABS: 20.0000 mg | ORAL_TABLET | Freq: Every day | ORAL | 3 refills | Status: DC | PRN

## 2015-11-19 MED ORDER — ACETAMINOPHEN-CODEINE #3 300-30 MG PO TABS: 1.0000 | ORAL_TABLET | Freq: Four times a day (QID) | ORAL | 3 refills | Status: DC | PRN

## 2015-11-19 MED ORDER — CYCLOBENZAPRINE HCL 10 MG PO TABS: 10.0000 mg | ORAL_TABLET | Freq: Three times a day (TID) | ORAL | 0 refills | Status: DC | PRN

## 2015-11-19 NOTE — Assessment & Plan Note (Signed)
No change 

## 2015-11-19 NOTE — Assessment & Plan Note (Signed)
Chronic  Tylenol #3 prn  Potential benefits of a long term opioids use as well as potential risks (i.e. addiction risk, apnea etc) and complications (i.e. Somnolence, constipation and others) were explained to the patient and were aknowledged. Flexeril

## 2015-11-19 NOTE — Assessment & Plan Note (Signed)
2017 s/p thyroidectomy Dr Dalbert Batman Levothroid 88 mcg

## 2015-11-19 NOTE — Progress Notes (Signed)
Pre visit review using our clinic review tool, if applicable. No additional management support is needed unless otherwise documented below in the visit note. 

## 2015-11-19 NOTE — Assessment & Plan Note (Signed)
Better  

## 2015-11-19 NOTE — Assessment & Plan Note (Signed)
Tylenol #3 prn  Potential benefits of a long term opioids use as well as potential risks (i.e. addiction risk, apnea etc) and complications (i.e. Somnolence, constipation and others) were explained to the patient and were aknowledged. Flexeril for TMJ

## 2015-11-19 NOTE — Assessment & Plan Note (Signed)
Veronica [name]  Labs 

## 2015-11-19 NOTE — Assessment & Plan Note (Signed)
Labs

## 2015-11-19 NOTE — Patient Instructions (Signed)
Sleep on the left side Memory foam GERD wedge

## 2015-11-19 NOTE — Progress Notes (Signed)
Subjective:  Patient ID: Veronica [name], female    DOB: 03/03/1951  Age: 65 y.o. MRN: [id number]  CC: No chief complaint on file.   HPI Veronica [name] presents for edema, hypothyroidism, cough f/u. TSH 2.44 09/30/15  Outpatient Medications Prior to Visit  Medication Sig Dispense Refill  . acetaminophen-codeine (TYLENOL #3) 300-30 MG tablet Take 1 tablet by mouth every 6 (six) hours as needed for severe pain. 60 tablet 3  . Cholecalciferol 1000 units tablet Take 1,000 Units by mouth daily.    . clonazePAM (KLONOPIN) 1 MG tablet 2 po qhs and 1/2 tab in addition in daytime prn (Patient taking differently: Take 0.5-2 mg by mouth 2 (two) times daily as needed for anxiety. 2 po qhs and 1/2 tab in addition in daytime prn) 225 tablet 1  . cyanocobalamin (,VITAMIN B-12,) 1000 MCG/ML injection Inject 1 mL (1,000 mcg total) into the muscle every 14 (fourteen) days. 6 mL 11  . cyclobenzaprine (FLEXERIL) 10 MG tablet Take 1 tablet (10 mg total) by mouth 3 (three) times daily as needed for muscle spasms. Take 1 tablet by mouth three times a day as needed 90 tablet 0  . Docusate Calcium (STOOL SOFTENER PO) Take 100 mg by mouth as needed (for constipation).     [name] escitalopram (LEXAPRO) 20 MG tablet Take 20 mg by mouth daily.   3  . furosemide (LASIX) 20 MG tablet Take 1 tablet (20 mg total) by mouth daily. 30 tablet 2  . Insulin Syringe-Needle U-100 (B-D INSULIN SYRINGE 1CC/26G) 26G X 1/2" 1 ML MISC Inject 1 each into the skin as directed. 100 each 5  . Ipratropium-Albuterol (COMBIVENT RESPIMAT) 20-100 MCG/ACT AERS respimat Inhale 1 puff into the lungs every 6 (six) hours as needed for wheezing or shortness of breath. 1 Inhaler 11  . levothyroxine (SYNTHROID, LEVOTHROID) 88 MCG tablet Take 1 tablet (88 mcg total) by mouth daily before breakfast. 30 tablet 1  . magic mouthwash SOLN Take 5 mLs by mouth 4 (four) times daily. Swish, hold and spit out---Do  Not swallow.  100 ml of dexamethasone 0.5 mg per 5  ml elixir 60 ml nystatin 100,000 Unit 100 ml of diphenhydramine 12.5 mg per 5 ml elixir 260 mL 3  . phenytoin (DILANTIN) 100 MG ER capsule Take 100-200 mg by mouth 2 (two) times daily. 200 mg every morning and 100 mg every evening, DILANTIN "brand name" only    . pravastatin (PRAVACHOL) 80 MG tablet TAKE 1 TABLET BY MOUTH EVERY DAY 90 tablet 3  . promethazine (PHENERGAN) 25 MG tablet Take 0.5 tablets (12.5 mg total) by mouth every 6 (six) hours as needed. 60 tablet 1  . sodium chloride (MURO 128) 5 % ophthalmic solution Place 1 drop into both eyes as needed for irritation.     . traZODone (DESYREL) 50 MG tablet TAKE 4 TABLETS BY MOUTH AT BEDTIME (Patient taking differently: TAKE 4 TABLETS (200 mg)  BY MOUTH AT BEDTIME) 360 tablet 3  . verapamil (CALAN-SR) 240 MG CR tablet Take 240 mg by mouth at bedtime.   5   No facility-administered medications prior to visit.     ROS Review of Systems  Constitutional: Positive for unexpected weight change. Negative for activity change, appetite change, chills and fatigue.  HENT: Negative for congestion, mouth sores and sinus pressure.   Eyes: Negative for visual disturbance.  Respiratory: Positive for cough. Negative for chest tightness.   Gastrointestinal: Negative for abdominal pain and nausea.  Genitourinary: Negative  for difficulty urinating, frequency and vaginal pain.  Musculoskeletal: Negative for back pain and gait problem.  Skin: Negative for pallor and rash.  Neurological: Negative for dizziness, tremors, weakness, numbness and headaches.  Psychiatric/Behavioral: Negative for confusion and sleep disturbance. The patient is nervous/anxious.     Objective:  BP 140/78   Pulse 61   Wt 122 lb (55.3 kg)   SpO2 99%   BMI 20.30 kg/m   BP Readings from Last 3 Encounters:  11/19/15 140/78  10/07/15 134/80  08/31/15 (!) 123/44    Wt Readings from Last 3 Encounters:  11/19/15 122 lb (55.3 kg)  10/07/15 120 lb (54.4 kg)  08/29/15 115 lb  1.3 oz (52.2 kg)    Physical Exam  Constitutional: She appears well-developed. No distress.  HENT:  Head: Normocephalic.  Right Ear: External ear normal.  Left Ear: External ear normal.  Nose: Nose normal.  Mouth/Throat: Oropharynx is clear and moist.  Eyes: Conjunctivae are normal. Pupils are equal, round, and reactive to light. Right eye exhibits no discharge. Left eye exhibits no discharge.  Neck: Normal range of motion. Neck supple. No JVD present. No tracheal deviation present. No thyromegaly present.  Cardiovascular: Normal rate, regular rhythm and normal heart sounds.   Pulmonary/Chest: No stridor. No respiratory distress. She has no wheezes.  Abdominal: Soft. Bowel sounds are normal. She exhibits no distension and no mass. There is no tenderness. There is no rebound and no guarding.  Musculoskeletal: She exhibits tenderness. She exhibits no edema.  Lymphadenopathy:    She has no cervical adenopathy.  Neurological: She displays normal reflexes. No cranial nerve deficit. She exhibits normal muscle tone. Coordination abnormal.  Skin: No rash noted. No erythema.  Psychiatric: She has a normal mood and affect. Her behavior is normal. Judgment and thought content normal.  cane Coughing a little  Lab Results  Component Value Date   WBC 8.8 08/21/2015   HGB 14.2 08/21/2015   HCT 44.3 08/21/2015   PLT 237 08/21/2015   GLUCOSE 78 10/07/2015   CHOL 242 (H) 12/06/2014   TRIG 98.0 12/06/2014   HDL 69.60 12/06/2014   LDLDIRECT 165.0 09/26/2009   LDLCALC 153 (H) 12/06/2014   ALT 11 08/01/2015   AST 17 08/01/2015   NA 138 10/07/2015   K 4.0 10/07/2015   CL 100 10/07/2015   CREATININE 0.59 10/07/2015   BUN 8 10/07/2015   CO2 32 10/07/2015   TSH 1.49 08/01/2015   INR 1.0 ratio 11/07/2009    Ct Chest W Contrast  Result Date: 10/10/2015 CLINICAL DATA:  Possible nodular changes at the right lung base. EXAM: CT CHEST WITH CONTRAST TECHNIQUE: Multidetector CT imaging of the chest  was performed during intravenous contrast administration. CONTRAST:  46mL ISOVUE-300 IOPAMIDOL (ISOVUE-300) INJECTION 61% COMPARISON:  08/21/2015 FINDINGS: Cardiovascular: Calcific changes of the thoracic aorta are noted without aneurysmal dilatation or dissection. The brachiocephalic vessels are within normal limits. The pulmonary artery as visualized is within normal limits. Mild coronary calcifications are seen. Cardiac structures are within normal limits otherwise. Mediastinum/Nodes: No significant hilar or mediastinal adenopathy is noted. The thoracic inlet is within normal limits. The esophagus as visualize is within normal limits. Lungs/Pleura: The lungs are well aerated bilaterally. In the inferior most aspect of the right middle lobe some nodular densities are seen but are relatively stable from a prior exam of 2012. These likely represent areas of scarring. Some similar appearing changes are noted in the right upper lobe inferiorly and anteriorly on image number 70  of series 3. Although these may represent areas of scarring the possibility of acute infiltrate would deserve consideration as well. No focal confluent infiltrate is seen. Upper Abdomen: Visualized upper abdomen is within normal limits with the exception of postsurgical changes related to prior reflux surgery. Musculoskeletal: No acute bony abnormality is noted. IMPRESSION: Changes in the right middle lobe inferiorly which corresponds to that seen on prior plain film examination but are relatively stable from 2012. This likely represents areas of scarring. Minimal changes in the inferior aspect of the right upper lobe as described. This may also represent scarring although a mild acute infiltrate or sequelae from previous infiltrate cannot be totally excluded. Electronically Signed   By: [name] M.D.   On: 10/10/2015 16:56   Assessment & Plan:   There are no diagnoses linked to this encounter. I am having Ms. Kyne maintain her  phenytoin, traZODone, Docusate Calcium (STOOL SOFTENER PO), pravastatin, clonazePAM, verapamil, escitalopram, Cholecalciferol, sodium chloride, levothyroxine, acetaminophen-codeine, furosemide, cyanocobalamin, Ipratropium-Albuterol, promethazine, Insulin Syringe-Needle U-100, magic mouthwash, and cyclobenzaprine.  No orders of the defined types were placed in this encounter.    Follow-up: No Follow-up on file.  Walker Kehr, MD

## 2015-11-19 NOTE — Assessment & Plan Note (Signed)
On B12 

## 2015-12-16 ENCOUNTER — Other Ambulatory Visit: Payer: Self-pay | Admitting: *Deleted

## 2015-12-16 MED ORDER — CYCLOBENZAPRINE HCL 10 MG PO TABS: 10.0000 mg | ORAL_TABLET | Freq: Three times a day (TID) | ORAL | 0 refills | Status: DC | PRN

## 2015-12-17 ENCOUNTER — Other Ambulatory Visit: Payer: Self-pay | Admitting: Internal Medicine

## 2016-02-18 ENCOUNTER — Encounter: Payer: Self-pay | Admitting: Internal Medicine

## 2016-02-18 ENCOUNTER — Ambulatory Visit (INDEPENDENT_AMBULATORY_CARE_PROVIDER_SITE_OTHER): Payer: Medicare Other | Admitting: Internal Medicine

## 2016-02-18 VITALS — BP 140/78 | HR 73 | Temp 98.6°F | Ht 62.0 in | Wt 132.0 lb

## 2016-02-18 DIAGNOSIS — R634 Abnormal weight loss: Secondary | ICD-10-CM

## 2016-02-18 DIAGNOSIS — K219 Gastro-esophageal reflux disease without esophagitis: Secondary | ICD-10-CM

## 2016-02-18 DIAGNOSIS — F419 Anxiety disorder, unspecified: Secondary | ICD-10-CM

## 2016-02-18 DIAGNOSIS — L500 Allergic urticaria: Secondary | ICD-10-CM

## 2016-02-18 DIAGNOSIS — E875 Hyperkalemia: Secondary | ICD-10-CM

## 2016-02-18 DIAGNOSIS — L508 Other urticaria: Secondary | ICD-10-CM

## 2016-02-18 DIAGNOSIS — J42 Unspecified chronic bronchitis: Secondary | ICD-10-CM

## 2016-02-18 DIAGNOSIS — J4540 Moderate persistent asthma, uncomplicated: Secondary | ICD-10-CM | POA: Diagnosis not present

## 2016-02-18 DIAGNOSIS — Z0000 Encounter for general adult medical examination without abnormal findings: Secondary | ICD-10-CM | POA: Diagnosis not present

## 2016-02-18 DIAGNOSIS — E559 Vitamin D deficiency, unspecified: Secondary | ICD-10-CM

## 2016-02-18 DIAGNOSIS — E538 Deficiency of other specified B group vitamins: Secondary | ICD-10-CM

## 2016-02-18 DIAGNOSIS — I10 Essential (primary) hypertension: Secondary | ICD-10-CM

## 2016-02-18 DIAGNOSIS — E890 Postprocedural hypothyroidism: Secondary | ICD-10-CM

## 2016-02-18 MED ORDER — VERAPAMIL HCL ER 240 MG PO TBCR: 240.0000 mg | EXTENDED_RELEASE_TABLET | Freq: Every day | ORAL | 3 refills | Status: DC

## 2016-02-18 MED ORDER — CLONAZEPAM 1 MG PO TABS: ORAL_TABLET | ORAL | 1 refills | Status: DC

## 2016-02-18 MED ORDER — TRAZODONE HCL 50 MG PO TABS: 200.0000 mg | ORAL_TABLET | Freq: Every day | ORAL | 3 refills | Status: DC

## 2016-02-18 MED ORDER — ESCITALOPRAM OXALATE 20 MG PO TABS: 20.0000 mg | ORAL_TABLET | Freq: Every day | ORAL | 3 refills | Status: DC

## 2016-02-18 MED ORDER — PROMETHAZINE-CODEINE 6.25-10 MG/5ML PO SYRP: 5.0000 mL | ORAL_SOLUTION | Freq: Four times a day (QID) | ORAL | 1 refills | Status: DC | PRN

## 2016-02-18 MED ORDER — FUROSEMIDE 20 MG PO TABS: 20.0000 mg | ORAL_TABLET | Freq: Every day | ORAL | 3 refills | Status: DC | PRN

## 2016-02-18 MED ORDER — METHYLPREDNISOLONE ACETATE 80 MG/ML IJ SUSP
80.0000 mg | Freq: Once | INTRAMUSCULAR | Status: AC
  Administered 2016-02-18: 80 mg via INTRAMUSCULAR

## 2016-02-18 MED ORDER — LEVOTHYROXINE SODIUM 88 MCG PO TABS: 88.0000 ug | ORAL_TABLET | Freq: Every day | ORAL | 3 refills | Status: DC

## 2016-02-18 MED ORDER — ACETAMINOPHEN-CODEINE #3 300-30 MG PO TABS: 1.0000 | ORAL_TABLET | Freq: Four times a day (QID) | ORAL | 3 refills | Status: DC | PRN

## 2016-02-18 NOTE — Addendum Note (Signed)
Addended by: Cresenciano Lick on: 02/18/2016 03:50 PM   Modules accepted: Orders

## 2016-02-18 NOTE — Assessment & Plan Note (Signed)
Worse Depo-medrol 80 mg IM 

## 2016-02-18 NOTE — Assessment & Plan Note (Signed)
Not on Nexium due to cost

## 2016-02-18 NOTE — Assessment & Plan Note (Signed)
Labs Levothroid 88 mcg

## 2016-02-18 NOTE — Assessment & Plan Note (Signed)
Here for medicare wellness/physical  Diet: heart healthy  Physical activity: not sedentary  Depression/mood screen: negative  Hearing: intact to whispered voice  Visual acuity: grossly normal w/glasses, performs annual eye exam  ADLs: capable  Fall risk: high Home safety: good  Cognitive evaluation: intact to orientation, naming, recall and repetition  EOL planning: adv directives, full code/ I agree  I have personally reviewed and have noted  1. The patient's medical, surgical and social history  2. Their use of alcohol, tobacco or illicit drugs  3. Their current medications and supplements  4. The patient's functional ability including ADL's, fall risks, home safety risks and hearing or visual impairment.  5. Diet and physical activities  6. Evidence for depression or mood disorders 7. The roster of all physicians providing medical care to patient - is listed in the Snapshot section of the chart and reviewed today.    Today patient counseled on age appropriate routine health concerns for screening and prevention, each reviewed and up to date or declined. Immunizations reviewed and up to date or declined. Labs ordered and reviewed. Risk factors for depression reviewed and negative. Hearing function and visual acuity are intact. ADLs screened and addressed as needed. Functional ability and level of safety reviewed and appropriate. Education, counseling and referrals performed based on assessed risks today. Patient provided with a copy of personalized plan for preventive services.

## 2016-02-18 NOTE — Assessment & Plan Note (Signed)
Wt Readings from Last 3 Encounters:  02/18/16 132 lb (59.9 kg)  11/19/15 122 lb (55.3 kg)  10/07/15 120 lb (54.4 kg)

## 2016-02-18 NOTE — Assessment & Plan Note (Signed)
Clonazepam prn 

## 2016-02-18 NOTE — Assessment & Plan Note (Signed)
Calan SR 

## 2016-02-18 NOTE — Assessment & Plan Note (Signed)
On Vit D 

## 2016-02-18 NOTE — Assessment & Plan Note (Signed)
Pulmicort Combivent Finished abx in November

## 2016-02-18 NOTE — Progress Notes (Signed)
Subjective:  Patient ID: Veronica [name], female    DOB: 1950-07-07  Age: 65 y.o. MRN: [id number]  CC: No chief complaint on file.   HPI Veronica [name] presents for B12 def, COPD, depression, hypothyroidism f/u.  Outpatient Medications Prior to Visit  Medication Sig Dispense Refill  . acetaminophen-codeine (TYLENOL #3) 300-30 MG tablet Take 1 tablet by mouth every 6 (six) hours as needed for severe pain. 60 tablet 3  . Cholecalciferol 1000 units tablet Take 1,000 Units by mouth daily.    . clonazePAM (KLONOPIN) 1 MG tablet 2 po qhs and 1/2 tab in addition in daytime prn (Patient taking differently: Take 0.5-2 mg by mouth 2 (two) times daily as needed for anxiety. 2 po qhs and 1/2 tab in addition in daytime prn) 225 tablet 1  . cyanocobalamin (,VITAMIN B-12,) 1000 MCG/ML injection Inject 1 mL (1,000 mcg total) into the muscle every 14 (fourteen) days. 6 mL 11  . cyclobenzaprine (FLEXERIL) 10 MG tablet Take 1 tablet (10 mg total) by mouth 3 (three) times daily as needed for muscle spasms. 90 tablet 0  . Docusate Calcium (STOOL SOFTENER PO) Take 100 mg by mouth as needed (for constipation).     [name] escitalopram (LEXAPRO) 20 MG tablet Take 20 mg by mouth daily.   3  . furosemide (LASIX) 20 MG tablet Take 1 tablet (20 mg total) by mouth daily as needed. 30 tablet 3  . Insulin Syringe-Needle U-100 (B-D INSULIN SYRINGE 1CC/26G) 26G X 1/2" 1 ML MISC Inject 1 each into the skin as directed. 100 each 5  . Ipratropium-Albuterol (COMBIVENT RESPIMAT) 20-100 MCG/ACT AERS respimat Inhale 1 puff into the lungs every 6 (six) hours as needed for wheezing or shortness of breath. 1 Inhaler 11  . levothyroxine (SYNTHROID, LEVOTHROID) 88 MCG tablet Take 1 tablet (88 mcg total) by mouth daily before breakfast. 30 tablet 1  . magic mouthwash SOLN Take 5 mLs by mouth 4 (four) times daily. Swish, hold and spit out---Do  Not swallow.  100 ml of dexamethasone 0.5 mg per 5 ml elixir 60 ml nystatin 100,000 Unit 100 ml  of diphenhydramine 12.5 mg per 5 ml elixir 260 mL 3  . phenytoin (DILANTIN) 100 MG ER capsule Take 100-200 mg by mouth 2 (two) times daily. 200 mg every morning and 100 mg every evening, DILANTIN "brand name" only    . pravastatin (PRAVACHOL) 80 MG tablet TAKE 1 TABLET BY MOUTH EVERY DAY 90 tablet 1  . promethazine (PHENERGAN) 25 MG tablet Take 0.5 tablets (12.5 mg total) by mouth every 6 (six) hours as needed. 60 tablet 1  . sodium chloride (MURO 128) 5 % ophthalmic solution Place 1 drop into both eyes as needed for irritation.     . traZODone (DESYREL) 50 MG tablet TAKE 4 TABLETS BY MOUTH AT BEDTIME (Patient taking differently: TAKE 4 TABLETS (200 mg)  BY MOUTH AT BEDTIME) 360 tablet 3  . verapamil (CALAN-SR) 240 MG CR tablet Take 240 mg by mouth at bedtime.   5   No facility-administered medications prior to visit.     ROS Review of Systems  Constitutional: Positive for fatigue. Negative for activity change, appetite change, chills and unexpected weight change.  HENT: Negative for congestion, mouth sores and sinus pressure.   Eyes: Negative for visual disturbance.  Respiratory: Positive for cough. Negative for chest tightness.   Gastrointestinal: Negative for abdominal pain and nausea.  Genitourinary: Negative for difficulty urinating, frequency and vaginal pain.  Musculoskeletal: Negative  for back pain and gait problem.  Skin: Negative for pallor and rash.  Neurological: Positive for tremors and weakness. Negative for dizziness, numbness and headaches.  Psychiatric/Behavioral: Positive for decreased concentration and sleep disturbance. Negative for confusion. The patient is nervous/anxious.     Objective:  BP 140/78   Pulse 73   Temp 98.6 F (37 C) (Oral)   Ht 5\' 2"  (1.575 m)   Wt 132 lb (59.9 kg)   SpO2 99%   BMI 24.14 kg/m   BP Readings from Last 3 Encounters:  02/18/16 140/78  11/19/15 140/78  10/07/15 134/80    Wt Readings from Last 3 Encounters:  02/18/16 132 lb  (59.9 kg)  11/19/15 122 lb (55.3 kg)  10/07/15 120 lb (54.4 kg)    Physical Exam  Constitutional: She appears well-developed. No distress.  HENT:  Head: Normocephalic.  Right Ear: External ear normal.  Left Ear: External ear normal.  Nose: Nose normal.  Mouth/Throat: Oropharynx is clear and moist.  Eyes: Conjunctivae are normal. Pupils are equal, round, and reactive to light. Right eye exhibits no discharge. Left eye exhibits no discharge.  Neck: Normal range of motion. Neck supple. No JVD present. No tracheal deviation present. No thyromegaly present.  Cardiovascular: Normal rate, regular rhythm and normal heart sounds.   Pulmonary/Chest: No stridor. No respiratory distress. She has wheezes.  Abdominal: Soft. Bowel sounds are normal. She exhibits no distension and no mass. There is no tenderness. There is no rebound and no guarding.  Musculoskeletal: She exhibits tenderness. She exhibits no edema.  Lymphadenopathy:    She has no cervical adenopathy.  Neurological: She displays normal reflexes. No cranial nerve deficit. She exhibits normal muscle tone. Coordination abnormal.  Skin: No rash noted. No erythema.  Psychiatric: Her behavior is normal. Judgment and thought content normal.  ataxic cane  Lab Results  Component Value Date   WBC 8.8 08/21/2015   HGB 14.2 08/21/2015   HCT 44.3 08/21/2015   PLT 237 08/21/2015   GLUCOSE 83 11/19/2015   CHOL 242 (H) 12/06/2014   TRIG 98.0 12/06/2014   HDL 69.60 12/06/2014   LDLDIRECT 165.0 09/26/2009   LDLCALC 153 (H) 12/06/2014   ALT 11 08/01/2015   AST 17 08/01/2015   NA 140 11/19/2015   K 4.4 11/19/2015   CL 103 11/19/2015   CREATININE 0.54 11/19/2015   BUN 8 11/19/2015   CO2 35 (H) 11/19/2015   TSH 1.49 08/01/2015   INR 1.0 ratio 11/07/2009    Ct Chest W Contrast  Result Date: 10/10/2015 CLINICAL DATA:  Possible nodular changes at the right lung base. EXAM: CT CHEST WITH CONTRAST TECHNIQUE: Multidetector CT imaging of the  chest was performed during intravenous contrast administration. CONTRAST:  2mL ISOVUE-300 IOPAMIDOL (ISOVUE-300) INJECTION 61% COMPARISON:  08/21/2015 FINDINGS: Cardiovascular: Calcific changes of the thoracic aorta are noted without aneurysmal dilatation or dissection. The brachiocephalic vessels are within normal limits. The pulmonary artery as visualized is within normal limits. Mild coronary calcifications are seen. Cardiac structures are within normal limits otherwise. Mediastinum/Nodes: No significant hilar or mediastinal adenopathy is noted. The thoracic inlet is within normal limits. The esophagus as visualize is within normal limits. Lungs/Pleura: The lungs are well aerated bilaterally. In the inferior most aspect of the right middle lobe some nodular densities are seen but are relatively stable from a prior exam of 2012. These likely represent areas of scarring. Some similar appearing changes are noted in the right upper lobe inferiorly and anteriorly on image number 70  of series 3. Although these may represent areas of scarring the possibility of acute infiltrate would deserve consideration as well. No focal confluent infiltrate is seen. Upper Abdomen: Visualized upper abdomen is within normal limits with the exception of postsurgical changes related to prior reflux surgery. Musculoskeletal: No acute bony abnormality is noted. IMPRESSION: Changes in the right middle lobe inferiorly which corresponds to that seen on prior plain film examination but are relatively stable from 2012. This likely represents areas of scarring. Minimal changes in the inferior aspect of the right upper lobe as described. This may also represent scarring although a mild acute infiltrate or sequelae from previous infiltrate cannot be totally excluded. Electronically Signed   By: [name] M.D.   On: 10/10/2015 16:56   Assessment & Plan:   Diagnoses and all orders for this visit:  Allergic urticaria -     clonazePAM  (KLONOPIN) 1 MG tablet; 2 po qhs and 1/2 tab in addition in daytime prn  Urticaria, chronic -     clonazePAM (KLONOPIN) 1 MG tablet; 2 po qhs and 1/2 tab in addition in daytime prn  HYPERKALEMIA -     clonazePAM (KLONOPIN) 1 MG tablet; 2 po qhs and 1/2 tab in addition in daytime prn  B12 deficiency -     clonazePAM (KLONOPIN) 1 MG tablet; 2 po qhs and 1/2 tab in addition in daytime prn  Vitamin D deficiency -     clonazePAM (KLONOPIN) 1 MG tablet; 2 po qhs and 1/2 tab in addition in daytime prn  Other orders -     escitalopram (LEXAPRO) 20 MG tablet; Take 1 tablet (20 mg total) by mouth daily. -     verapamil (CALAN-SR) 240 MG CR tablet; Take 1 tablet (240 mg total) by mouth at bedtime. -     traZODone (DESYREL) 50 MG tablet; Take 4 tablets (200 mg total) by mouth at bedtime. -     acetaminophen-codeine (TYLENOL #3) 300-30 MG tablet; Take 1 tablet by mouth every 6 (six) hours as needed for severe pain. -     levothyroxine (SYNTHROID, LEVOTHROID) 88 MCG tablet; Take 1 tablet (88 mcg total) by mouth daily before breakfast. -     furosemide (LASIX) 20 MG tablet; Take 1 tablet (20 mg total) by mouth daily as needed.   I am having Ms. Maina maintain her phenytoin, traZODone, Docusate Calcium (STOOL SOFTENER PO), clonazePAM, verapamil, escitalopram, Cholecalciferol, sodium chloride, levothyroxine, cyanocobalamin, Ipratropium-Albuterol, promethazine, Insulin Syringe-Needle U-100, magic mouthwash, furosemide, acetaminophen-codeine, cyclobenzaprine, and pravastatin.  No orders of the defined types were placed in this encounter.    Follow-up: No Follow-up on file.  Walker Kehr, MD

## 2016-02-18 NOTE — Progress Notes (Signed)
Pre visit review using our clinic review tool, if applicable. No additional management support is needed unless otherwise documented below in the visit note. 

## 2016-02-19 ENCOUNTER — Telehealth: Payer: Self-pay | Admitting: *Deleted

## 2016-02-19 MED ORDER — CYCLOBENZAPRINE HCL 10 MG PO TABS: 10.0000 mg | ORAL_TABLET | Freq: Three times a day (TID) | ORAL | 0 refills | Status: DC | PRN

## 2016-02-19 NOTE — Telephone Encounter (Signed)
Rec'd call pt states she forgot to get refill on her flexeril on yesterday. Inform pt will send to [facility]...Johny Chess

## 2016-04-05 ENCOUNTER — Encounter: Payer: Self-pay | Admitting: Emergency Medicine

## 2016-04-05 ENCOUNTER — Emergency Department (INDEPENDENT_AMBULATORY_CARE_PROVIDER_SITE_OTHER): Payer: Medicare Other

## 2016-04-05 ENCOUNTER — Emergency Department (INDEPENDENT_AMBULATORY_CARE_PROVIDER_SITE_OTHER)
Admission: EM | Admit: 2016-04-05 | Discharge: 2016-04-05 | Disposition: A | Discharge status: Home / Self Care | Payer: Medicare Other | Source: Home / Self Care

## 2016-04-05 ENCOUNTER — Telehealth: Payer: Self-pay

## 2016-04-05 DIAGNOSIS — I700 Atherosclerosis of aorta: Secondary | ICD-10-CM | POA: Diagnosis not present

## 2016-04-05 DIAGNOSIS — J40 Bronchitis, not specified as acute or chronic: Secondary | ICD-10-CM

## 2016-04-05 MED ORDER — LEVOFLOXACIN 500 MG PO TABS: 500.0000 mg | ORAL_TABLET | Freq: Every day | ORAL | 0 refills | Status: DC

## 2016-04-05 NOTE — ED Provider Notes (Signed)
CSN: [id number]     Arrival date & time 04/05/16  1212 History   None    Chief Complaint  Patient presents with  . Cough   (Consider location/radiation/quality/duration/timing/severity/associated sxs/prior Treatment) The history is provided by the patient. No language interpreter was used.  Cough  Cough characteristics:  Productive Sputum characteristics:  Yellow Severity:  Moderate Onset quality:  Gradual Duration:  1 week Timing:  Constant Progression:  Worsening Chronicity:  New Context: not sick contacts   Relieved by:  Nothing Worsened by:  Nothing Ineffective treatments:  None tried Associated symptoms: no shortness of breath, no sinus congestion and no sore throat   Risk factors: no recent infection    Pt complains of cough and fever.  Pt reports she has a history of chest infections.  She has had pneumonia in the past. Pt reports she has had scar tissue on previous xrays Past Medical History:  Diagnosis Date  . Asthma   . AVM (arteriovenous malformation)   . Colon polyp 01/04/91   hyperplastic  . COPD (chronic obstructive pulmonary disease) ([place])   . CVA (cerebral infarction)    following brain surgery  . Depression   . Diverticulosis of colon 02/17/06  . Endometriosis   . Gastric ulcer   . GERD (gastroesophageal reflux disease)   . Hyperlipidemia   . Hypertension   . LBP (low back pain)   . Migraine headache   . OA (osteoarthritis)   . PONV (postoperative nausea and vomiting)    slight nausea  . Seizure disorder ([place])    entered twice   . Seizure disorder ([place])   . Seizures ([place])   . Shortness of breath dyspnea   . Sjogren's disease ([place]) 2010   per Dr. Owens Shark, [place]  . Stroke [facility])    right side weakness  . Thyroid nodule   . Vitamin B 12 deficiency   . Vitamin D deficiency    Past Surgical History:  Procedure Laterality Date  . BRAIN SURGERY  1991  . CATARACT EXTRACTION W/ INTRAOCULAR LENS  IMPLANT, BILATERAL    . CHOLECYSTECTOMY    .  COLONOSCOPY    . HEMORRHOIDECTOMY WITH HEMORRHOID BANDING    . LAPAROSCOPIC NISSEN FUNDOPLICATION    . LAPAROSCOPIC OVARIAN CYSTECTOMY    . MOUTH SURGERY    . NISSEN FUNDOPLICATION  [id number]  . TEMPOROMANDIBULAR JOINT SURGERY  02/2001  . THYROIDECTOMY N/A 08/29/2015   Procedure:  TOTAL THYROIDECTOMY;  Surgeon: Armandina Gemma, MD;  Location: [facility];  Service: General;  Laterality: N/A;  . TOTAL THYROIDECTOMY  08/29/2015   Family History  Problem Relation Age of Onset  . Hypertension Mother   . Heart disease Father   . Pancreatic cancer Father   . Colon cancer Sister 26  . Hypertension Other   . Diabetes Other   . Diabetes Brother    Social History  Substance Use Topics  . Smoking status: Never Smoker  . Smokeless tobacco: Never Used  . Alcohol use No   OB History    No data available     Review of Systems  HENT: Negative for sore throat.   Respiratory: Positive for cough. Negative for shortness of breath.   All other systems reviewed and are negative.   Allergies  Cephalexin; Moxifloxacin; Amantadine hcl; Bee venom; Cymbalta [duloxetine hcl]; Cyproheptadine hcl; Doxycycline hyclate; Effexor [venlafaxine hydrochloride]; Fluticasone-salmeterol; Guaifenesin; Imipramine hcl; Lactose intolerance (gi); Latex; Levothyroxine sodium; Oxycodone-acetaminophen; Phenytoin; Pirbuterol acetate; Remeron [mirtazapine]; Salmeterol xinafoate; Viibryd [vilazodone hcl]; Zolpidem tartrate;  Azithromycin; Ciprofloxacin; Erythromycin base; Flagyl [metronidazole hcl]; Fluconazole; Fluoxetine; Lansoprazole; Metoclopramide hcl; Montelukast sodium; Penicillins; Propulsid [cisapride]; Reglan [metoclopramide]; Sulfadiazine; Sulfamethoxazole-trimethoprim; Telithromycin; Tetracycline hcl; Topiramate; and Valproic acid  Home Medications   Prior to Admission medications   Medication Sig Start Date End Date Taking? Authorizing Provider  acetaminophen-codeine (TYLENOL #3) 300-30 MG tablet Take 1 tablet by mouth every 6  (six) hours as needed for severe pain. 02/18/16   Aleksei Plotnikov V, MD  Cholecalciferol 1000 units tablet Take 1,000 Units by mouth daily.    Historical Provider, MD  clonazePAM (KLONOPIN) 1 MG tablet 2 po qhs and 1/2 tab in addition in daytime prn 02/18/16   Aleksei Plotnikov V, MD  cyanocobalamin (,VITAMIN B-12,) 1000 MCG/ML injection Inject 1 mL (1,000 mcg total) into the muscle every 14 (fourteen) days. 10/07/15   Aleksei Plotnikov V, MD  cyclobenzaprine (FLEXERIL) 10 MG tablet Take 1 tablet (10 mg total) by mouth 3 (three) times daily as needed for muscle spasms. 02/19/16   Aleksei Plotnikov V, MD  Docusate Calcium (STOOL SOFTENER PO) Take 100 mg by mouth as needed (for constipation).     Historical Provider, MD  escitalopram (LEXAPRO) 20 MG tablet Take 1 tablet (20 mg total) by mouth daily. 02/18/16   Aleksei Plotnikov V, MD  furosemide (LASIX) 20 MG tablet Take 1 tablet (20 mg total) by mouth daily as needed. 02/18/16   Aleksei Plotnikov V, MD  Insulin Syringe-Needle U-100 (B-D INSULIN SYRINGE 1CC/26G) 26G X 1/2" 1 ML MISC Inject 1 each into the skin as directed. 10/07/15   Aleksei Plotnikov V, MD  Ipratropium-Albuterol (COMBIVENT RESPIMAT) 20-100 MCG/ACT AERS respimat Inhale 1 puff into the lungs every 6 (six) hours as needed for wheezing or shortness of breath. 10/07/15   Lew Dawes V, MD  levofloxacin (LEVAQUIN) 500 MG tablet Take 1 tablet (500 mg total) by mouth daily. 04/05/16   Fransico Meadow, PA-C  levothyroxine (SYNTHROID, LEVOTHROID) 88 MCG tablet Take 1 tablet (88 mcg total) by mouth daily before breakfast. 02/18/16   Cassandria Anger, MD  magic mouthwash SOLN Take 5 mLs by mouth 4 (four) times daily. Swish, hold and spit out---Do  Not swallow.  100 ml of dexamethasone 0.5 mg per 5 ml elixir 60 ml nystatin 100,000 Unit 100 ml of diphenhydramine 12.5 mg per 5 ml elixir 10/08/15   Lew Dawes V, MD  phenytoin (DILANTIN) 100 MG ER capsule Take 100-200 mg by mouth 2 (two) times  daily. 200 mg every morning and 100 mg every evening, DILANTIN "brand name" only    Historical Provider, MD  pravastatin (PRAVACHOL) 80 MG tablet TAKE 1 TABLET BY MOUTH EVERY DAY 12/17/15   Aleksei Plotnikov V, MD  promethazine (PHENERGAN) 25 MG tablet Take 0.5 tablets (12.5 mg total) by mouth every 6 (six) hours as needed. 10/07/15   Aleksei Plotnikov V, MD  promethazine-codeine (PHENERGAN WITH CODEINE) 6.25-10 MG/5ML syrup Take 5 mLs by mouth every 6 (six) hours as needed for cough. 02/18/16   Aleksei Plotnikov V, MD  sodium chloride (MURO 128) 5 % ophthalmic solution Place 1 drop into both eyes as needed for irritation.     Historical Provider, MD  traZODone (DESYREL) 50 MG tablet Take 4 tablets (200 mg total) by mouth at bedtime. 02/18/16   Aleksei Plotnikov V, MD  verapamil (CALAN-SR) 240 MG CR tablet Take 1 tablet (240 mg total) by mouth at bedtime. 02/18/16   Cassandria Anger, MD   Meds Ordered and Administered this Visit  Medications -  No data to display  BP 158/75 (BP Location: Left Arm)   Pulse 78   Temp 98.2 F (36.8 C) (Oral)   Ht 5\' 2"  (1.575 m)   Wt 129 lb (58.5 kg)   SpO2 98%   BMI 23.59 kg/m  No data found.   Physical Exam  Constitutional: She is oriented to person, place, and time. She appears well-developed and well-nourished.  HENT:  Head: Normocephalic.  Eyes: EOM are normal.  Neck: Normal range of motion.  Cardiovascular: Normal rate and regular rhythm.   Pulmonary/Chest: Effort normal. She has no wheezes. She exhibits no tenderness.  Rhonchi all lobes   Abdominal: She exhibits no distension.  Musculoskeletal: Normal range of motion.  Neurological: She is alert and oriented to person, place, and time.  Psychiatric: She has a normal mood and affect.  Nursing note and vitals reviewed.   Urgent Care Course     Procedures (including critical care time)  Labs Review Labs Reviewed - No data to display  Imaging Review Dg Chest 2 View  Result Date:  04/05/2016 CLINICAL DATA:  Cough and fever for several days. Abnormal chest exam on the right. History of previous episodes of bronchitis and pneumonia EXAM: CHEST  2 VIEW COMPARISON:  Chest x-ray of August 21, 2015. FINDINGS: The lungs are well-expanded. There is stable density in the lower retro sternal region that likely reflects scarring. The heart and pulmonary vascularity are normal. The mediastinum is normal in width. There is calcification in the wall of the thoracic aorta. The observed bony thorax exhibits no acute abnormality. IMPRESSION: There is no pneumonia nor other acute cardiopulmonary abnormality. There is thoracic aortic atherosclerosis. Electronically Signed   By: David  Martinique M.D.   On: 04/05/2016 14:04     Visual Acuity Review  Right Eye Distance:   Left Eye Distance:   Bilateral Distance:    Right Eye Near:   Left Eye Near:    Bilateral Near:         MDM   1. Bronchitis    I will treat for bronchitis.  I doubt influenza as pt has had over a week.  Pt has multiple drug allergies.  Pt reports levaquin works bet for her. Pt advised to follow up with her MD for recheck in 3-4 days Meds ordered this encounter  Medications  . levofloxacin (LEVAQUIN) 500 MG tablet    Sig: Take 1 tablet (500 mg total) by mouth daily.    Dispense:  20 tablet    Refill:  0    Order Specific Question:   Supervising Provider    Answer:   Burnett Harry, DAVID [[id number]]   An After Visit Summary was printed and given to the patient.   [place], PA-C 04/05/16 1502

## 2016-04-05 NOTE — Telephone Encounter (Signed)
Pt called for clarification of levoquin script.  Spoke with K. [place], [place] and it is to be written levoquin 500 mg, 1 daily for 10 days.  Dispense 10 no refills.  Spoke with Economist at [facility], Jule Ser.

## 2016-04-05 NOTE — Discharge Instructions (Signed)
See your Physician for recheck in 3-4 days °

## 2016-04-05 NOTE — ED Triage Notes (Signed)
Productive cough x 2 weeks yellow to green mucus, fever 101 last night

## 2016-04-06 ENCOUNTER — Ambulatory Visit: Payer: Medicare Other | Admitting: Internal Medicine

## 2016-04-20 ENCOUNTER — Other Ambulatory Visit (INDEPENDENT_AMBULATORY_CARE_PROVIDER_SITE_OTHER): Payer: Medicare Other

## 2016-04-20 DIAGNOSIS — L508 Other urticaria: Secondary | ICD-10-CM

## 2016-04-20 DIAGNOSIS — E890 Postprocedural hypothyroidism: Secondary | ICD-10-CM | POA: Diagnosis not present

## 2016-04-20 DIAGNOSIS — I10 Essential (primary) hypertension: Secondary | ICD-10-CM | POA: Diagnosis not present

## 2016-04-20 DIAGNOSIS — E538 Deficiency of other specified B group vitamins: Secondary | ICD-10-CM

## 2016-04-20 LAB — TSH: TSH: 2.83 u[IU]/mL (ref 0.35–4.50)

## 2016-04-20 LAB — CBC WITH DIFFERENTIAL/PLATELET
BASOS ABS: 0.1 10*3/uL (ref 0.0–0.1)
Basophils Relative: 1.1 % (ref 0.0–3.0)
EOS ABS: 0.1 10*3/uL (ref 0.0–0.7)
Eosinophils Relative: 0.8 % (ref 0.0–5.0)
HCT: 39.5 % (ref 36.0–46.0)
Hemoglobin: 13.3 g/dL (ref 12.0–15.0)
Lymphocytes Relative: 21.4 % (ref 12.0–46.0)
Lymphs Abs: 2 10*3/uL (ref 0.7–4.0)
MCHC: 33.7 g/dL (ref 30.0–36.0)
MCV: 91.4 fl (ref 78.0–100.0)
MONO ABS: 1.6 10*3/uL — AB (ref 0.1–1.0)
Monocytes Relative: 16.6 % — ABNORMAL HIGH (ref 3.0–12.0)
Neutro Abs: 5.7 10*3/uL (ref 1.4–7.7)
Neutrophils Relative %: 60.1 % (ref 43.0–77.0)
Platelets: 318 10*3/uL (ref 150.0–400.0)
RBC: 4.32 Mil/uL (ref 3.87–5.11)
RDW: 14.7 % (ref 11.5–15.5)
WBC: 9.5 10*3/uL (ref 4.0–10.5)

## 2016-04-20 LAB — BASIC METABOLIC PANEL
BUN: 9 mg/dL (ref 6–23)
CHLORIDE: 103 meq/L (ref 96–112)
CO2: 34 mEq/L — ABNORMAL HIGH (ref 19–32)
Calcium: 8.3 mg/dL — ABNORMAL LOW (ref 8.4–10.5)
Creatinine, Ser: 0.58 mg/dL (ref 0.40–1.20)
GFR: 110.19 mL/min (ref >60.00)
GLUCOSE: 103 mg/dL — AB (ref 70–99)
POTASSIUM: 4.1 meq/L (ref 3.5–5.1)
Sodium: 139 mEq/L (ref 135–145)

## 2016-04-20 LAB — HEPATIC FUNCTION PANEL
ALBUMIN: 3.9 g/dL (ref 3.5–5.2)
ALT: 7 U/L (ref 0–35)
AST: 12 U/L (ref 0–37)
Alkaline Phosphatase: 104 U/L (ref 39–117)
Bilirubin, Direct: 0.1 mg/dL (ref 0.0–0.3)
Total Bilirubin: 0.3 mg/dL (ref 0.2–1.2)
Total Protein: 6.7 g/dL (ref 6.0–8.3)

## 2016-05-19 ENCOUNTER — Ambulatory Visit (INDEPENDENT_AMBULATORY_CARE_PROVIDER_SITE_OTHER): Payer: Medicare Other | Admitting: Internal Medicine

## 2016-05-19 ENCOUNTER — Encounter: Payer: Self-pay | Admitting: Internal Medicine

## 2016-05-19 VITALS — BP 146/74 | HR 63 | Temp 97.9°F | Resp 16 | Ht 62.0 in | Wt 125.5 lb

## 2016-05-19 DIAGNOSIS — R05 Cough: Secondary | ICD-10-CM

## 2016-05-19 DIAGNOSIS — E890 Postprocedural hypothyroidism: Secondary | ICD-10-CM | POA: Diagnosis not present

## 2016-05-19 DIAGNOSIS — R059 Cough, unspecified: Secondary | ICD-10-CM

## 2016-05-19 DIAGNOSIS — G8929 Other chronic pain: Secondary | ICD-10-CM

## 2016-05-19 DIAGNOSIS — K219 Gastro-esophageal reflux disease without esophagitis: Secondary | ICD-10-CM

## 2016-05-19 DIAGNOSIS — M545 Low back pain: Secondary | ICD-10-CM

## 2016-05-19 DIAGNOSIS — J069 Acute upper respiratory infection, unspecified: Secondary | ICD-10-CM | POA: Diagnosis not present

## 2016-05-19 DIAGNOSIS — E538 Deficiency of other specified B group vitamins: Secondary | ICD-10-CM | POA: Diagnosis not present

## 2016-05-19 DIAGNOSIS — I10 Essential (primary) hypertension: Secondary | ICD-10-CM

## 2016-05-19 DIAGNOSIS — J42 Unspecified chronic bronchitis: Secondary | ICD-10-CM

## 2016-05-19 MED ORDER — CLONAZEPAM 1 MG PO TABS: ORAL_TABLET | ORAL | 1 refills | Status: DC

## 2016-05-19 MED ORDER — METHYLPREDNISOLONE ACETATE 80 MG/ML IJ SUSP
80.0000 mg | Freq: Once | INTRAMUSCULAR | Status: AC
  Administered 2016-05-19: 80 mg via INTRAMUSCULAR

## 2016-05-19 MED ORDER — ACETAMINOPHEN-CODEINE #3 300-30 MG PO TABS: 1.0000 | ORAL_TABLET | Freq: Four times a day (QID) | ORAL | 3 refills | Status: DC | PRN

## 2016-05-19 MED ORDER — PROMETHAZINE-CODEINE 6.25-10 MG/5ML PO SYRP: 5.0000 mL | ORAL_SOLUTION | Freq: Four times a day (QID) | ORAL | 1 refills | Status: DC | PRN

## 2016-05-19 MED ORDER — LEVOFLOXACIN 500 MG PO TABS: 500.0000 mg | ORAL_TABLET | Freq: Every day | ORAL | 0 refills | Status: DC

## 2016-05-19 NOTE — Assessment & Plan Note (Signed)
Calan SR 

## 2016-05-19 NOTE — Assessment & Plan Note (Signed)
Levaquin po

## 2016-05-19 NOTE — Assessment & Plan Note (Signed)
GERD precautions

## 2016-05-19 NOTE — Assessment & Plan Note (Signed)
Prom-cod syr prn 

## 2016-05-19 NOTE — Progress Notes (Signed)
Subjective:  Patient ID: Veronica [name], female    DOB: 1951/03/08  Age: 66 y.o. MRN: [id number]  CC: Follow-up (HTN, Hypothyroidism) and Cough (brown mucus, fever, going on for 2 months )   HPI Veronica [name] presents for productive cough since Jan 2018 - the pt had Levaquin x 10 d, CXR. Worse again now - cough w/ brown mucus and fever...  F/u B12 def, depression, hypothyroidism  Outpatient Medications Prior to Visit  Medication Sig Dispense Refill  . acetaminophen-codeine (TYLENOL #3) 300-30 MG tablet Take 1 tablet by mouth every 6 (six) hours as needed for severe pain. 60 tablet 3  . Cholecalciferol 1000 units tablet Take 1,000 Units by mouth daily.    . clonazePAM (KLONOPIN) 1 MG tablet 2 po qhs and 1/2 tab in addition in daytime prn 225 tablet 1  . cyanocobalamin (,VITAMIN B-12,) 1000 MCG/ML injection Inject 1 mL (1,000 mcg total) into the muscle every 14 (fourteen) days. 6 mL 11  . cyclobenzaprine (FLEXERIL) 10 MG tablet Take 1 tablet (10 mg total) by mouth 3 (three) times daily as needed for muscle spasms. 90 tablet 0  . Docusate Calcium (STOOL SOFTENER PO) Take 100 mg by mouth as needed (for constipation).     [name] escitalopram (LEXAPRO) 20 MG tablet Take 1 tablet (20 mg total) by mouth daily. 90 tablet 3  . furosemide (LASIX) 20 MG tablet Take 1 tablet (20 mg total) by mouth daily as needed. 90 tablet 3  . Insulin Syringe-Needle U-100 (B-D INSULIN SYRINGE 1CC/26G) 26G X 1/2" 1 ML MISC Inject 1 each into the skin as directed. 100 each 5  . Ipratropium-Albuterol (COMBIVENT RESPIMAT) 20-100 MCG/ACT AERS respimat Inhale 1 puff into the lungs every 6 (six) hours as needed for wheezing or shortness of breath. 1 Inhaler 11  . levofloxacin (LEVAQUIN) 500 MG tablet Take 1 tablet (500 mg total) by mouth daily. 20 tablet 0  . levothyroxine (SYNTHROID, LEVOTHROID) 88 MCG tablet Take 1 tablet (88 mcg total) by mouth daily before breakfast. 90 tablet 3  . magic mouthwash SOLN Take 5 mLs by  mouth 4 (four) times daily. Swish, hold and spit out---Do  Not swallow.  100 ml of dexamethasone 0.5 mg per 5 ml elixir 60 ml nystatin 100,000 Unit 100 ml of diphenhydramine 12.5 mg per 5 ml elixir 260 mL 3  . phenytoin (DILANTIN) 100 MG ER capsule Take 100-200 mg by mouth 2 (two) times daily. 200 mg every morning and 100 mg every evening, DILANTIN "brand name" only    . pravastatin (PRAVACHOL) 80 MG tablet TAKE 1 TABLET BY MOUTH EVERY DAY 90 tablet 1  . promethazine (PHENERGAN) 25 MG tablet Take 0.5 tablets (12.5 mg total) by mouth every 6 (six) hours as needed. 60 tablet 1  . promethazine-codeine (PHENERGAN WITH CODEINE) 6.25-10 MG/5ML syrup Take 5 mLs by mouth every 6 (six) hours as needed for cough. 300 mL 1  . sodium chloride (MURO 128) 5 % ophthalmic solution Place 1 drop into both eyes as needed for irritation.     . traZODone (DESYREL) 50 MG tablet Take 4 tablets (200 mg total) by mouth at bedtime. 360 tablet 3  . verapamil (CALAN-SR) 240 MG CR tablet Take 1 tablet (240 mg total) by mouth at bedtime. 90 tablet 3   No facility-administered medications prior to visit.     ROS Review of Systems  Constitutional: Positive for fatigue and fever. Negative for activity change, appetite change, chills and unexpected weight change.  HENT: Positive for rhinorrhea and sinus pain. Negative for congestion, mouth sores and sinus pressure.   Eyes: Negative for visual disturbance.  Respiratory: Positive for cough, shortness of breath and wheezing. Negative for chest tightness.   Gastrointestinal: Negative for abdominal pain and nausea.  Genitourinary: Negative for difficulty urinating, frequency and vaginal pain.  Musculoskeletal: Negative for back pain and gait problem.  Skin: Negative for pallor and rash.  Neurological: Positive for weakness. Negative for dizziness, tremors, numbness and headaches.  Psychiatric/Behavioral: Negative for confusion and sleep disturbance.    Objective:  BP (!)  146/74 (BP Location: Right Arm, Patient Position: Sitting, Cuff Size: Normal)   Pulse 63   Temp 97.9 F (36.6 C) (Oral)   Resp 16   Ht 5\' 2"  (1.575 m)   Wt 125 lb 8 oz (56.9 kg)   SpO2 96%   BMI 22.95 kg/m   BP Readings from Last 3 Encounters:  05/19/16 (!) 146/74  04/05/16 158/75  02/18/16 140/78    Wt Readings from Last 3 Encounters:  05/19/16 125 lb 8 oz (56.9 kg)  04/05/16 129 lb (58.5 kg)  02/18/16 132 lb (59.9 kg)    Physical Exam  Constitutional: She appears well-developed. No distress.  HENT:  Head: Normocephalic.  Right Ear: External ear normal.  Left Ear: External ear normal.  Nose: Nose normal.  Mouth/Throat: Oropharynx is clear and moist.  Eyes: Conjunctivae are normal. Pupils are equal, round, and reactive to light. Right eye exhibits no discharge. Left eye exhibits no discharge.  Neck: Normal range of motion. Neck supple. No JVD present. No tracheal deviation present. No thyromegaly present.  Cardiovascular: Normal rate, regular rhythm and normal heart sounds.   Pulmonary/Chest: No stridor. No respiratory distress. She has wheezes.  Abdominal: Soft. Bowel sounds are normal. She exhibits no distension and no mass. There is no tenderness. There is no rebound and no guarding.  Musculoskeletal: She exhibits no edema or tenderness.  Lymphadenopathy:    She has no cervical adenopathy.  Neurological: She displays normal reflexes. No cranial nerve deficit. She exhibits normal muscle tone. Coordination normal.  Skin: No rash noted. No erythema.  Psychiatric: She has a normal mood and affect. Her behavior is normal. Judgment and thought content normal.  coughing non-stop  Lab Results  Component Value Date   WBC 9.5 04/20/2016   HGB 13.3 04/20/2016   HCT 39.5 04/20/2016   PLT 318.0 04/20/2016   GLUCOSE 103 (H) 04/20/2016   CHOL 242 (H) 12/06/2014   TRIG 98.0 12/06/2014   HDL 69.60 12/06/2014   LDLDIRECT 165.0 09/26/2009   LDLCALC 153 (H) 12/06/2014   ALT 7  04/20/2016   AST 12 04/20/2016   NA 139 04/20/2016   K 4.1 04/20/2016   CL 103 04/20/2016   CREATININE 0.58 04/20/2016   BUN 9 04/20/2016   CO2 34 (H) 04/20/2016   TSH 2.83 04/20/2016   INR 1.0 ratio 11/07/2009    Dg Chest 2 View  Result Date: 04/05/2016 CLINICAL DATA:  Cough and fever for several days. Abnormal chest exam on the right. History of previous episodes of bronchitis and pneumonia EXAM: CHEST  2 VIEW COMPARISON:  Chest x-ray of August 21, 2015. FINDINGS: The lungs are well-expanded. There is stable density in the lower retro sternal region that likely reflects scarring. The heart and pulmonary vascularity are normal. The mediastinum is normal in width. There is calcification in the wall of the thoracic aorta. The observed bony thorax exhibits no acute abnormality. IMPRESSION: There  is no pneumonia nor other acute cardiopulmonary abnormality. There is thoracic aortic atherosclerosis. Electronically Signed   By: David  Martinique M.D.   On: 04/05/2016 14:04    Assessment & Plan:   There are no diagnoses linked to this encounter. I am having Ms. Curci maintain her phenytoin, Docusate Calcium (STOOL SOFTENER PO), Cholecalciferol, sodium chloride, cyanocobalamin, Ipratropium-Albuterol, promethazine, Insulin Syringe-Needle U-100, magic mouthwash, pravastatin, clonazePAM, escitalopram, verapamil, traZODone, acetaminophen-codeine, levothyroxine, furosemide, promethazine-codeine, cyclobenzaprine, and levofloxacin.  No orders of the defined types were placed in this encounter.    Follow-up: No Follow-up on file.  Walker Kehr, MD

## 2016-05-19 NOTE — Assessment & Plan Note (Addendum)
Worse Levaquin x 2 weeks Pulm ref Prom-cod syr (not to take w/T#3) Depomedrol 80 mg IM

## 2016-05-19 NOTE — Assessment & Plan Note (Signed)
On B12 

## 2016-05-19 NOTE — Assessment & Plan Note (Signed)
On Levothroid 

## 2016-05-19 NOTE — Assessment & Plan Note (Signed)
T#3 prn - not w/cough syrup

## 2016-05-19 NOTE — Progress Notes (Signed)
Pre-visit discussion using our clinic review tool. No additional management support is needed unless otherwise documented below in the visit note.  

## 2016-05-19 NOTE — Addendum Note (Signed)
Addended by: Valere Dross on: 05/19/2016 02:11 PM   Modules accepted: Orders

## 2016-05-31 ENCOUNTER — Other Ambulatory Visit (INDEPENDENT_AMBULATORY_CARE_PROVIDER_SITE_OTHER): Payer: Medicare Other

## 2016-05-31 ENCOUNTER — Ambulatory Visit (INDEPENDENT_AMBULATORY_CARE_PROVIDER_SITE_OTHER): Payer: Medicare Other | Admitting: Pulmonary Disease

## 2016-05-31 ENCOUNTER — Telehealth: Payer: Self-pay | Admitting: Pulmonary Disease

## 2016-05-31 ENCOUNTER — Encounter: Payer: Self-pay | Admitting: Pulmonary Disease

## 2016-05-31 VITALS — BP 132/78 | HR 73 | Ht 62.0 in | Wt 122.2 lb

## 2016-05-31 DIAGNOSIS — J42 Unspecified chronic bronchitis: Secondary | ICD-10-CM

## 2016-05-31 DIAGNOSIS — J45909 Unspecified asthma, uncomplicated: Secondary | ICD-10-CM

## 2016-05-31 DIAGNOSIS — M3500 Sicca syndrome, unspecified: Secondary | ICD-10-CM

## 2016-05-31 DIAGNOSIS — J439 Emphysema, unspecified: Secondary | ICD-10-CM | POA: Insufficient documentation

## 2016-05-31 DIAGNOSIS — R05 Cough: Secondary | ICD-10-CM

## 2016-05-31 DIAGNOSIS — R059 Cough, unspecified: Secondary | ICD-10-CM

## 2016-05-31 DIAGNOSIS — K219 Gastro-esophageal reflux disease without esophagitis: Secondary | ICD-10-CM | POA: Diagnosis not present

## 2016-05-31 LAB — CBC WITH DIFFERENTIAL/PLATELET
BASOS ABS: 0 10*3/uL (ref 0.0–0.1)
BASOS PCT: 0.3 % (ref 0.0–3.0)
EOS ABS: 0 10*3/uL (ref 0.0–0.7)
Eosinophils Relative: 0.4 % (ref 0.0–5.0)
HEMATOCRIT: 43 % (ref 36.0–46.0)
HEMOGLOBIN: 14.6 g/dL (ref 12.0–15.0)
LYMPHS PCT: 19.5 % (ref 12.0–46.0)
Lymphs Abs: 2 10*3/uL (ref 0.7–4.0)
MCHC: 33.9 g/dL (ref 30.0–36.0)
MCV: 90.6 fl (ref 78.0–100.0)
Monocytes Absolute: 1.1 10*3/uL — ABNORMAL HIGH (ref 0.1–1.0)
Monocytes Relative: 10.7 % (ref 3.0–12.0)
Neutro Abs: 6.9 10*3/uL (ref 1.4–7.7)
Neutrophils Relative %: 69.1 % (ref 43.0–77.0)
Platelets: 365 10*3/uL (ref 150.0–400.0)
RBC: 4.75 Mil/uL (ref 3.87–5.11)
RDW: 14.4 % (ref 11.5–15.5)
WBC: 10 10*3/uL (ref 4.0–10.5)

## 2016-05-31 NOTE — Patient Instructions (Addendum)
   Call me if you have any new breathing problems or questions before your next appointment.  I will see you back in a few weeks to review your test results.  TESTS ORDRED: 1. Serum Alpha-1 Antitrypsin Phenotype, RAST Panel, CBC w/ differential, ANA, Sjogrens Panel, & Quantitative Immunoglobulin Panel. 2. Full PFTs before next appointment 3. HRCT Chest w/o

## 2016-05-31 NOTE — Telephone Encounter (Signed)
IMAGING CXR PA/LAT 04/05/16 (personally reviewed by me):  Hyperinflation suggested by deep sulci bilaterally. No focal mass or opacity. No pleural effusion. Heart normal in size & mediastinum normal in contour.  CT CHEST W/ 10/10/15 (personally reviewed by me):  No pleural effusion or thickening. No pericardial effusion. No pathologic mediastinal adenopathy. Patchy opacification in medial and lateral segments of right middle lobe. Mild apical-predominant emphysematous changes.   LABS 11/30/11 IgE:  23.0
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
## 2016-06-01 LAB — RESPIRATORY ALLERGY PROFILE REGION II ~~LOC~~
ALLERGEN, D PTERNOYSSINUS, D1: 1.24 kU/L — AB
Allergen, A. alternata, m6: <0.1 kU/L
Allergen, C. Herbarum, M2: <0.1 kU/L
Allergen, Comm Silver Birch, t9: <0.1 kU/L
Allergen, Cottonwood, t14: <0.1 kU/L
Allergen, Mulberry, t76: <0.1 kU/L
Allergen, P. notatum, m1: <0.1 kU/L
Aspergillus fumigatus, m3: <0.1 kU/L
Bermuda Grass: <0.1 kU/L
Box Elder IgE: <0.1 kU/L
Cockroach: 0.4 kU/L — ABNORMAL HIGH
Common Ragweed: <0.1 kU/L
D. FARINAE: 1.36 kU/L — AB
IgE (Immunoglobulin E), Serum: 45 kU/L (ref <115)
Rough Pigweed  IgE: <0.1 kU/L
Sheep Sorrel IgE: <0.1 kU/L

## 2016-06-01 LAB — SJOGREN'S SYNDROME ANTIBODS(SSA + SSB)
SSA (RO) (ENA) ANTIBODY, IGG: NEGATIVE
SSB (La) (ENA) Antibody, IgG: <1

## 2016-06-01 LAB — ANA: ANA: NEGATIVE

## 2016-06-01 LAB — IGG, IGA, IGM
IGG (IMMUNOGLOBIN G), SERUM: 896 mg/dL (ref 694–1618)
IgA: 296 mg/dL (ref 81–463)
IgM, Serum: 104 mg/dL (ref 48–271)

## 2016-06-04 LAB — ALPHA-1 ANTITRYPSIN PHENOTYPE: A1 ANTITRYPSIN: 171 mg/dL (ref 83–199)

## 2016-06-21 ENCOUNTER — Other Ambulatory Visit: Payer: Self-pay | Admitting: Internal Medicine

## 2016-06-28 ENCOUNTER — Ambulatory Visit (INDEPENDENT_AMBULATORY_CARE_PROVIDER_SITE_OTHER)
Admission: RE | Admit: 2016-06-28 | Discharge: 2016-06-28 | Disposition: A | Discharge status: Home / Self Care | Payer: Medicare Other | Source: Ambulatory Visit | Attending: Pulmonary Disease | Admitting: Pulmonary Disease

## 2016-06-28 ENCOUNTER — Encounter: Payer: Self-pay | Admitting: Internal Medicine

## 2016-06-28 ENCOUNTER — Ambulatory Visit (INDEPENDENT_AMBULATORY_CARE_PROVIDER_SITE_OTHER): Payer: Medicare Other | Admitting: Internal Medicine

## 2016-06-28 DIAGNOSIS — I2510 Atherosclerotic heart disease of native coronary artery without angina pectoris: Secondary | ICD-10-CM

## 2016-06-28 DIAGNOSIS — G8929 Other chronic pain: Secondary | ICD-10-CM

## 2016-06-28 DIAGNOSIS — R05 Cough: Secondary | ICD-10-CM | POA: Diagnosis not present

## 2016-06-28 DIAGNOSIS — E538 Deficiency of other specified B group vitamins: Secondary | ICD-10-CM | POA: Diagnosis not present

## 2016-06-28 DIAGNOSIS — M545 Low back pain: Secondary | ICD-10-CM | POA: Diagnosis not present

## 2016-06-28 DIAGNOSIS — I10 Essential (primary) hypertension: Secondary | ICD-10-CM | POA: Diagnosis not present

## 2016-06-28 DIAGNOSIS — F329 Major depressive disorder, single episode, unspecified: Secondary | ICD-10-CM

## 2016-06-28 DIAGNOSIS — E890 Postprocedural hypothyroidism: Secondary | ICD-10-CM

## 2016-06-28 DIAGNOSIS — J42 Unspecified chronic bronchitis: Secondary | ICD-10-CM

## 2016-06-28 DIAGNOSIS — R059 Cough, unspecified: Secondary | ICD-10-CM

## 2016-06-28 MED ORDER — PROMETHAZINE-CODEINE 6.25-10 MG/5ML PO SYRP: 5.0000 mL | ORAL_SOLUTION | Freq: Four times a day (QID) | ORAL | 1 refills | Status: DC | PRN

## 2016-06-28 NOTE — Assessment & Plan Note (Signed)
T#3 - not to be taken w/Prom - Castine syr

## 2016-06-28 NOTE — Progress Notes (Signed)
Subjective:  Patient ID: Veronica [name], female    DOB: 01-13-1951  Age: 66 y.o. MRN: [id number]  CC: No chief complaint on file.   HPI SITA MANGEN presents for COPD, chronic cough, anxiety, seizure disorder f/u. Chest CT pending  Outpatient Medications Prior to Visit  Medication Sig Dispense Refill  . acetaminophen-codeine (TYLENOL #3) 300-30 MG tablet Take 1 tablet by mouth every 6 (six) hours as needed for severe pain. 60 tablet 3  . Cholecalciferol 1000 units tablet Take 1,000 Units by mouth daily.    . clonazePAM (KLONOPIN) 1 MG tablet 2 po qhs and 1/2 tab in addition in daytime prn 225 tablet 1  . cyanocobalamin (,VITAMIN B-12,) 1000 MCG/ML injection Inject 1 mL (1,000 mcg total) into the muscle every 14 (fourteen) days. 6 mL 11  . cyclobenzaprine (FLEXERIL) 10 MG tablet Take 1 tablet (10 mg total) by mouth 3 (three) times daily as needed for muscle spasms. 90 tablet 0  . Docusate Calcium (STOOL SOFTENER PO) Take 100 mg by mouth as needed (for constipation).     [name] escitalopram (LEXAPRO) 20 MG tablet Take 1 tablet (20 mg total) by mouth daily. 90 tablet 3  . furosemide (LASIX) 20 MG tablet Take 1 tablet (20 mg total) by mouth daily as needed. 90 tablet 3  . Insulin Syringe-Needle U-100 (B-D INSULIN SYRINGE 1CC/26G) 26G X 1/2" 1 ML MISC Inject 1 each into the skin as directed. 100 each 5  . Ipratropium-Albuterol (COMBIVENT RESPIMAT) 20-100 MCG/ACT AERS respimat Inhale 1 puff into the lungs every 6 (six) hours as needed for wheezing or shortness of breath. 1 Inhaler 11  . levofloxacin (LEVAQUIN) 500 MG tablet Take 1 tablet (500 mg total) by mouth daily. 14 tablet 0  . levothyroxine (SYNTHROID, LEVOTHROID) 88 MCG tablet Take 1 tablet (88 mcg total) by mouth daily before breakfast. 90 tablet 3  . magic mouthwash SOLN Take 5 mLs by mouth 4 (four) times daily. Swish, hold and spit out---Do  Not swallow.  100 ml of dexamethasone 0.5 mg per 5 ml elixir 60 ml nystatin 100,000 Unit 100  ml of diphenhydramine 12.5 mg per 5 ml elixir 260 mL 3  . phenytoin (DILANTIN) 100 MG ER capsule Take 100-200 mg by mouth 2 (two) times daily. 200 mg every morning and 100 mg every evening, DILANTIN "brand name" only    . pravastatin (PRAVACHOL) 80 MG tablet TAKE 1 TABLET BY MOUTH EVERY DAY 90 tablet 2  . promethazine (PHENERGAN) 25 MG tablet Take 0.5 tablets (12.5 mg total) by mouth every 6 (six) hours as needed. 60 tablet 1  . promethazine-codeine (PHENERGAN WITH CODEINE) 6.25-10 MG/5ML syrup Take 5 mLs by mouth every 6 (six) hours as needed for cough. 300 mL 1  . sodium chloride (MURO 128) 5 % ophthalmic solution Place 1 drop into both eyes as needed for irritation.     . traZODone (DESYREL) 50 MG tablet Take 4 tablets (200 mg total) by mouth at bedtime. 360 tablet 3  . verapamil (CALAN-SR) 240 MG CR tablet Take 1 tablet (240 mg total) by mouth at bedtime. 90 tablet 3   No facility-administered medications prior to visit.     ROS Review of Systems  Constitutional: Negative for activity change, appetite change, chills, fatigue and unexpected weight change.  HENT: Negative for congestion, mouth sores and sinus pressure.   Eyes: Negative for visual disturbance.  Respiratory: Positive for cough and wheezing. Negative for chest tightness.   Gastrointestinal: Negative for  abdominal pain and nausea.  Genitourinary: Negative for difficulty urinating, frequency and vaginal pain.  Musculoskeletal: Positive for gait problem. Negative for back pain.  Skin: Negative for pallor and rash.  Neurological: Negative for dizziness, tremors, weakness, numbness and headaches.  Psychiatric/Behavioral: Negative for confusion and sleep disturbance.    Objective:  BP (!) 144/68 (BP Location: Left Arm, Patient Position: Sitting, Cuff Size: Normal)   Pulse 66   Temp 97.8 F (36.6 C) (Oral)   Ht 5\' 2"  (1.575 m)   Wt 122 lb (55.3 kg)   SpO2 99%   BMI 22.31 kg/m   BP Readings from Last 3 Encounters:    06/28/16 (!) 144/68  05/31/16 132/78  05/19/16 (!) 146/74    Wt Readings from Last 3 Encounters:  06/28/16 122 lb (55.3 kg)  05/31/16 122 lb 3.2 oz (55.4 kg)  05/19/16 125 lb 8 oz (56.9 kg)    Physical Exam  Constitutional: She appears well-developed. No distress.  HENT:  Head: Normocephalic.  Right Ear: External ear normal.  Left Ear: External ear normal.  Nose: Nose normal.  Mouth/Throat: Oropharynx is clear and moist.  Eyes: Conjunctivae are normal. Pupils are equal, round, and reactive to light. Right eye exhibits no discharge. Left eye exhibits no discharge.  Neck: Normal range of motion. Neck supple. No JVD present. No tracheal deviation present. No thyromegaly present.  Cardiovascular: Normal rate, regular rhythm and normal heart sounds.   Pulmonary/Chest: No stridor. No respiratory distress. She has no wheezes.  Abdominal: Soft. Bowel sounds are normal. She exhibits no distension and no mass. There is no tenderness. There is no rebound and no guarding.  Musculoskeletal: She exhibits no edema or tenderness.  Lymphadenopathy:    She has no cervical adenopathy.  Neurological: She displays normal reflexes. No cranial nerve deficit. She exhibits normal muscle tone. Coordination abnormal.  Skin: No rash noted. No erythema.  Psychiatric: She has a normal mood and affect. Her behavior is normal. Judgment and thought content normal.  R LE brace Coughing all the time Cane  Lab Results  Component Value Date   WBC 10.0 05/31/2016   HGB 14.6 05/31/2016   HCT 43.0 05/31/2016   PLT 365.0 05/31/2016   GLUCOSE 103 (H) 04/20/2016   CHOL 242 (H) 12/06/2014   TRIG 98.0 12/06/2014   HDL 69.60 12/06/2014   LDLDIRECT 165.0 09/26/2009   LDLCALC 153 (H) 12/06/2014   ALT 7 04/20/2016   AST 12 04/20/2016   NA 139 04/20/2016   K 4.1 04/20/2016   CL 103 04/20/2016   CREATININE 0.58 04/20/2016   BUN 9 04/20/2016   CO2 34 (H) 04/20/2016   TSH 2.83 04/20/2016   INR 1.0 ratio  11/07/2009    Ct Chest High Resolution  Result Date: 06/28/2016 CLINICAL DATA:  66 year old female with history of asthma and Sjogren syndrome. Evaluate for potential interstitial lung disease. EXAM: CT CHEST WITHOUT CONTRAST TECHNIQUE: Multidetector CT imaging of the chest was performed following the standard protocol without intravenous contrast. High resolution imaging of the lungs, as well as inspiratory and expiratory imaging, was performed. COMPARISON:  Chest CT 10/10/2015. FINDINGS: Cardiovascular: Heart size is normal. There is no significant pericardial fluid, thickening or pericardial calcification. There is aortic atherosclerosis, as well as atherosclerosis of the great vessels of the mediastinum and the coronary arteries, including calcified atherosclerotic plaque in the left main, left anterior descending, left circumflex and right coronary arteries. Mediastinum/Nodes: No pathologically enlarged mediastinal or hilar lymph nodes. Please note that accurate exclusion  of hilar adenopathy is limited on noncontrast CT scans. Esophagus is unremarkable in appearance. No axillary lymphadenopathy. Lungs/Pleura: There are few scattered areas of peripheral bronchiolectasis and peripheral architectural distortion with volume loss associated with regions of mucoid impaction. This is rather mild, most evident in the inferior segment of the lingula and in the right middle lobe. A few other scattered areas of mild mucoid impaction are noted, particularly in the upper lobes of the lungs near the apices. High-resolution images demonstrate no more generalized areas of ground-glass attenuation, subpleural reticulation, parenchymal banding, traction bronchiectasis or frank honeycombing to suggest interstitial lung disease. Inspiratory and expiratory imaging demonstrates some very mild air trapping, indicative of mild small airways disease. No suspicious appearing pulmonary nodules or masses are noted. No pleural  effusions. Upper Abdomen: Postoperative changes just below the gastroesophageal junction, likely from prior fundoplication procedure. Aortic atherosclerosis. Musculoskeletal: There are no aggressive appearing lytic or blastic lesions noted in the visualized portions of the skeleton. IMPRESSION: 1. No evidence of interstitial lung disease. 2. 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 This appears very similar to prior study 10/02/2015. 3. Aortic atherosclerosis, in addition to left main and 3 vessel coronary artery disease. Please note that although the presence of coronary artery calcium documents the presence of coronary artery disease, the severity of this disease and any potential stenosis cannot be assessed on this non-gated CT examination. Assessment for potential risk factor modification, dietary therapy or pharmacologic therapy may be warranted, if clinically indicated. Electronically Signed   By: Vinnie Langton M.D.   On: 06/28/2016 15:38    Assessment & Plan:   There are no diagnoses linked to this encounter. I am having Ms. Manfre maintain her phenytoin, Docusate Calcium (STOOL SOFTENER PO), Cholecalciferol, sodium chloride, cyanocobalamin, Ipratropium-Albuterol, promethazine, Insulin Syringe-Needle U-100, magic mouthwash, escitalopram, verapamil, traZODone, levothyroxine, furosemide, cyclobenzaprine, clonazePAM, levofloxacin, promethazine-codeine, acetaminophen-codeine, and pravastatin.  No orders of the defined types were placed in this encounter.    Follow-up: No Follow-up on file.  Walker Kehr, MD

## 2016-06-28 NOTE — Assessment & Plan Note (Signed)
Doing fair 

## 2016-06-28 NOTE — Assessment & Plan Note (Signed)
Calan SR 

## 2016-06-28 NOTE — Assessment & Plan Note (Signed)
On Levothroid 

## 2016-06-28 NOTE — Assessment & Plan Note (Signed)
3V CAD on chest CT Card ref

## 2016-06-28 NOTE — Assessment & Plan Note (Addendum)
F/u w/Dr Ashok Cordia Chest CT  Prom-cod syr prn

## 2016-06-28 NOTE — Progress Notes (Signed)
Pre visit review using our clinic review tool, if applicable. No additional management support is needed unless otherwise documented below in the visit note. 

## 2016-06-28 NOTE — Assessment & Plan Note (Signed)
On B12 inj 

## 2016-07-09 ENCOUNTER — Telehealth: Payer: Self-pay | Admitting: Pulmonary Disease

## 2016-07-09 ENCOUNTER — Ambulatory Visit (INDEPENDENT_AMBULATORY_CARE_PROVIDER_SITE_OTHER): Payer: Medicare Other | Admitting: Pulmonary Disease

## 2016-07-09 ENCOUNTER — Telehealth: Payer: Self-pay | Admitting: Internal Medicine

## 2016-07-09 ENCOUNTER — Encounter: Payer: Self-pay | Admitting: Pulmonary Disease

## 2016-07-09 VITALS — BP 138/70 | HR 78 | Ht 62.0 in | Wt 121.0 lb

## 2016-07-09 DIAGNOSIS — J45909 Unspecified asthma, uncomplicated: Secondary | ICD-10-CM | POA: Diagnosis not present

## 2016-07-09 DIAGNOSIS — K219 Gastro-esophageal reflux disease without esophagitis: Secondary | ICD-10-CM | POA: Diagnosis not present

## 2016-07-09 DIAGNOSIS — J479 Bronchiectasis, uncomplicated: Secondary | ICD-10-CM

## 2016-07-09 DIAGNOSIS — J42 Unspecified chronic bronchitis: Secondary | ICD-10-CM | POA: Diagnosis not present

## 2016-07-09 DIAGNOSIS — I2510 Atherosclerotic heart disease of native coronary artery without angina pectoris: Secondary | ICD-10-CM

## 2016-07-09 LAB — PULMONARY FUNCTION TEST
DL/VA % PRED: 107 %
DL/VA: 5.07 ml/min/mmHg/L
DLCO COR % PRED: 66 %
DLCO COR: 15.71 ml/min/mmHg
DLCO UNC % PRED: 64 %
DLCO unc: 15.2 ml/min/mmHg
FEF 25-75 POST: 2.58 L/s
FEF 25-75 PRE: 1.71 L/s
FEF2575-%CHANGE-POST: 50 %
FEF2575-%PRED-PRE: 85 %
FEF2575-%Pred-Post: 128 %
FEV1-%Change-Post: 10 %
FEV1-%PRED-PRE: 64 %
FEV1-%Pred-Post: 71 %
FEV1-POST: 1.64 L
FEV1-Pre: 1.48 L
FEV1FVC-%CHANGE-POST: 3 %
FEV1FVC-%PRED-PRE: 108 %
FEV6-%Change-Post: 7 %
FEV6-%PRED-POST: 65 %
FEV6-%PRED-PRE: 61 %
FEV6-PRE: 1.78 L
FEV6-Post: 1.9 L
FEV6FVC-%Pred-Post: 104 %
FEV6FVC-%Pred-Pre: 104 %
FVC-%Change-Post: 7 %
FVC-%PRED-POST: 62 %
FVC-%Pred-Pre: 58 %
FVC-Post: 1.9 L
FVC-Pre: 1.78 L
Post FEV1/FVC ratio: 86 %
Post FEV6/FVC ratio: 100 %
Pre FEV1/FVC ratio: 83 %
Pre FEV6/FVC Ratio: 100 %
RV % pred: 104 %
RV: 2.21 L
TLC % PRED: 81 %
TLC: 4.08 L

## 2016-07-09 MED ORDER — FLUTTER DEVI: 0 refills | Status: AC

## 2016-07-09 MED ORDER — ALBUTEROL SULFATE (2.5 MG/3ML) 0.083% IN NEBU: 2.5000 mg | INHALATION_SOLUTION | Freq: Two times a day (BID) | RESPIRATORY_TRACT | 3 refills | Status: DC

## 2016-07-09 MED ORDER — SODIUM CHLORIDE 3 % IN NEBU: INHALATION_SOLUTION | Freq: Two times a day (BID) | RESPIRATORY_TRACT | 3 refills | Status: DC

## 2016-07-09 NOTE — Progress Notes (Signed)
PFT done today. 

## 2016-07-09 NOTE — Telephone Encounter (Signed)
See below, please advise about appointment
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
## 2016-07-09 NOTE — Addendum Note (Signed)
Addended by: Tyson Dense on: 07/09/2016 05:09 PM   Modules accepted: Orders

## 2016-07-09 NOTE — Telephone Encounter (Signed)
Spoke with patient and informed her [facility] pharmacy would not be able to fulfill 3% hypertonic saline neb solution. She was informed the prescription was sent to her [facility] in [place]. She verbalized understanding and did not have any questions. Nothing further is needed.

## 2016-07-09 NOTE — Progress Notes (Signed)
Patient seen in the office today and instructed on use of flutter device.  Patient expressed understanding and demonstrated technique. 

## 2016-07-09 NOTE — Telephone Encounter (Signed)
Patient came into office. She wanted Dr. Camila Li to know she got the appointment with Dr.Croitoru, He will not be able to see her until July. She wanted to make sure that was going to be ok with Dr. Camila Li. She is feeling ok right now,she is just worried about her health. Thank you.

## 2016-07-09 NOTE — Patient Instructions (Signed)
   Call me if you have any new breathing problems or questions before your next appointment.  Shop around for your nebulizer and nebulizer medications to see where you can get it cheaper. I recommend trying with [facility] or [facility].  These new nebulizer medications will replace your Combivent.   I want you to do the following regimen twice daily to help with your cough and mucus production:  Start with Albuterol nebulized  Then nebulize the 3% hypertonic saline  Follow that up with your Flutter Valve - blow into it 3 times in a row  I will see you back in 3 months or sooner if needed.

## 2016-07-11 NOTE — Telephone Encounter (Signed)
It is ok. If CP - pls go to ER Thx

## 2016-07-12 ENCOUNTER — Telehealth: Payer: Self-pay | Admitting: Pulmonary Disease

## 2016-07-12 NOTE — Telephone Encounter (Signed)
Spoke with pharmacist and informed them of the dx code. Nothing further is needed will close this message.

## 2016-07-12 NOTE — Telephone Encounter (Signed)
Spoke with Helene Kelp at [facility], rx changed verbally from 64ml to 60 vials.  Nothing further needed.

## 2016-07-12 NOTE — Telephone Encounter (Signed)
Will close this message, since pt came into the office.

## 2016-07-12 NOTE — Telephone Encounter (Signed)
Pt came to the office because she was concerned because when [facility] called her to inform her that [facility] would not prescribe one of her medications she forgot to ask about the albuterol. Their was another phone note from [place] needing the dx code, called them and gave them that information. Informed the pt that yes they will reach out to her about out. She also was concerned because she did not hear from [facility] about getting a neb machine. Called them and someone will reach out to pt today. Informed pt of this. She had no further questions. Nothing further is needed

## 2016-07-12 NOTE — Telephone Encounter (Signed)
Pt notified and voiced understanding 

## 2016-08-26 ENCOUNTER — Telehealth: Payer: Self-pay | Admitting: Pulmonary Disease

## 2016-08-26 NOTE — Telephone Encounter (Signed)
A detailed written order prescription was faxed to [facility] at [phone] for patients medical supplies. The order states: mask/salter/ adult w/o tubing for qty 1 and monthly frequency, cups/respironics cust. nebs cups qty 2 with bi weekly frequency, and neb kit/ respironics custom kit qty 2 with bi weekly frequency. The order is for twelve months with 30 days supply. A confirmation fax was received. Nothing further is needed.

## 2016-09-23 ENCOUNTER — Ambulatory Visit (INDEPENDENT_AMBULATORY_CARE_PROVIDER_SITE_OTHER): Payer: Medicare Other | Admitting: Cardiovascular Disease

## 2016-09-23 ENCOUNTER — Encounter: Payer: Self-pay | Admitting: Cardiovascular Disease

## 2016-09-23 VITALS — BP 140/72 | HR 70 | Ht 62.0 in | Wt 123.0 lb

## 2016-09-23 DIAGNOSIS — R072 Precordial pain: Secondary | ICD-10-CM | POA: Diagnosis not present

## 2016-09-23 DIAGNOSIS — I700 Atherosclerosis of aorta: Secondary | ICD-10-CM

## 2016-09-23 DIAGNOSIS — I10 Essential (primary) hypertension: Secondary | ICD-10-CM | POA: Diagnosis not present

## 2016-09-23 DIAGNOSIS — I2510 Atherosclerotic heart disease of native coronary artery without angina pectoris: Secondary | ICD-10-CM

## 2016-09-23 DIAGNOSIS — E785 Hyperlipidemia, unspecified: Secondary | ICD-10-CM

## 2016-09-23 NOTE — Patient Instructions (Signed)
Medication Instructions: Dr Sallyanne Kuster recommends that you continue on your current medications as directed. Please refer to the Current Medication list given to you today.  Labwork: Your physician recommends that you return for lab work in at your earliest [place].  Testing/Procedures: 1. Your physician has requested that you have a lexiscan myoview. For further information please visit [email or web address]. Please follow instruction sheet, as given.  Follow-up: Dr Sallyanne Kuster recommends that you schedule a follow-up appointment after the stress test.  If you need a refill on your cardiac medications before your next appointment, please call your pharmacy.
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
## 2016-09-24 DIAGNOSIS — I700 Atherosclerosis of aorta: Secondary | ICD-10-CM | POA: Insufficient documentation

## 2016-09-27 ENCOUNTER — Encounter: Payer: Self-pay | Admitting: Internal Medicine

## 2016-09-27 ENCOUNTER — Ambulatory Visit (INDEPENDENT_AMBULATORY_CARE_PROVIDER_SITE_OTHER): Payer: Medicare Other | Admitting: Internal Medicine

## 2016-09-27 DIAGNOSIS — F419 Anxiety disorder, unspecified: Secondary | ICD-10-CM

## 2016-09-27 DIAGNOSIS — I10 Essential (primary) hypertension: Secondary | ICD-10-CM

## 2016-09-27 DIAGNOSIS — I2510 Atherosclerotic heart disease of native coronary artery without angina pectoris: Secondary | ICD-10-CM | POA: Diagnosis not present

## 2016-09-27 DIAGNOSIS — J42 Unspecified chronic bronchitis: Secondary | ICD-10-CM | POA: Diagnosis not present

## 2016-09-27 MED ORDER — ACETAMINOPHEN-CODEINE #3 300-30 MG PO TABS: 1.0000 | ORAL_TABLET | Freq: Four times a day (QID) | ORAL | 3 refills | Status: DC | PRN

## 2016-09-27 MED ORDER — TRAZODONE HCL 50 MG PO TABS: 200.0000 mg | ORAL_TABLET | Freq: Every day | ORAL | 3 refills | Status: DC

## 2016-09-27 MED ORDER — PROMETHAZINE-CODEINE 6.25-10 MG/5ML PO SYRP: 5.0000 mL | ORAL_SOLUTION | Freq: Four times a day (QID) | ORAL | 1 refills | Status: DC | PRN

## 2016-09-27 MED ORDER — LEVOTHYROXINE SODIUM 88 MCG PO TABS: 88.0000 ug | ORAL_TABLET | Freq: Every day | ORAL | 3 refills | Status: DC

## 2016-09-27 MED ORDER — CYCLOBENZAPRINE HCL 10 MG PO TABS: 10.0000 mg | ORAL_TABLET | Freq: Three times a day (TID) | ORAL | 0 refills | Status: DC | PRN

## 2016-09-27 MED ORDER — CLONAZEPAM 1 MG PO TABS: ORAL_TABLET | ORAL | 1 refills | Status: DC

## 2016-09-27 NOTE — Assessment & Plan Note (Signed)
[name]

## 2016-09-27 NOTE — Assessment & Plan Note (Signed)
CL pending

## 2016-09-27 NOTE — Patient Instructions (Signed)
Use [employer] Peroxicare tooth paste  

## 2016-09-27 NOTE — Progress Notes (Signed)
Subjective:  Patient ID: Veronica [name], female    DOB: Aug 15, 1950  Age: 66 y.o. MRN: [id number]  CC: No chief complaint on file.   HPI Veronica [name] presents for CP, B12 def, chronic cough f/u.   Outpatient Medications Prior to Visit  Medication Sig Dispense Refill  . acetaminophen-codeine (TYLENOL #3) 300-30 MG tablet Take 1 tablet by mouth every 6 (six) hours as needed for severe pain. 60 tablet 3  . albuterol (PROVENTIL) (2.5 MG/3ML) 0.083% nebulizer solution Take 3 mLs (2.5 mg total) by nebulization 2 (two) times daily. 60 mL 3  . Cholecalciferol 1000 units tablet Take 1,000 Units by mouth daily.    . clonazePAM (KLONOPIN) 1 MG tablet 2 po qhs and 1/2 tab in addition in daytime prn 225 tablet 1  . cyanocobalamin (,VITAMIN B-12,) 1000 MCG/ML injection Inject 1 mL (1,000 mcg total) into the muscle every 14 (fourteen) days. 6 mL 11  . cyclobenzaprine (FLEXERIL) 10 MG tablet Take 1 tablet (10 mg total) by mouth 3 (three) times daily as needed for muscle spasms. 90 tablet 0  . Docusate Calcium (STOOL SOFTENER PO) Take 100 mg by mouth as needed (for constipation).     [name] escitalopram (LEXAPRO) 20 MG tablet Take 1 tablet (20 mg total) by mouth daily. 90 tablet 3  . furosemide (LASIX) 20 MG tablet Take 1 tablet (20 mg total) by mouth daily as needed. 90 tablet 3  . Insulin Syringe-Needle U-100 (B-D INSULIN SYRINGE 1CC/26G) 26G X 1/2" 1 ML MISC Inject 1 each into the skin as directed. 100 each 5  . Ipratropium-Albuterol (COMBIVENT RESPIMAT) 20-100 MCG/ACT AERS respimat Inhale 1 puff into the lungs every 6 (six) hours as needed for wheezing or shortness of breath. 1 Inhaler 11  . levofloxacin (LEVAQUIN) 500 MG tablet Take 1 tablet (500 mg total) by mouth daily. 14 tablet 0  . levothyroxine (SYNTHROID, LEVOTHROID) 88 MCG tablet Take 1 tablet (88 mcg total) by mouth daily before breakfast. 90 tablet 3  . magic mouthwash SOLN Take 5 mLs by mouth 4 (four) times daily. Swish, hold and spit  out---Do  Not swallow.  100 ml of dexamethasone 0.5 mg per 5 ml elixir 60 ml nystatin 100,000 Unit 100 ml of diphenhydramine 12.5 mg per 5 ml elixir 260 mL 3  . phenytoin (DILANTIN) 100 MG ER capsule Take 100-200 mg by mouth 2 (two) times daily. 200 mg every morning and 100 mg every evening, DILANTIN "brand name" only    . pravastatin (PRAVACHOL) 80 MG tablet TAKE 1 TABLET BY MOUTH EVERY DAY 90 tablet 2  . promethazine (PHENERGAN) 25 MG tablet Take 0.5 tablets (12.5 mg total) by mouth every 6 (six) hours as needed. 60 tablet 1  . promethazine-codeine (PHENERGAN WITH CODEINE) 6.25-10 MG/5ML syrup Take 5 mLs by mouth every 6 (six) hours as needed for cough. 300 mL 1  . Respiratory Therapy Supplies (FLUTTER) DEVI Use after nebulization treatments 1 each 0  . sodium chloride (MURO 128) 5 % ophthalmic solution Place 1 drop into both eyes as needed for irritation.     . sodium chloride HYPERTONIC 3 % nebulizer solution Take by nebulization 2 (two) times daily. Use 4 milliliters. 60 mL 3  . traZODone (DESYREL) 50 MG tablet Take 4 tablets (200 mg total) by mouth at bedtime. 360 tablet 3  . verapamil (CALAN-SR) 240 MG CR tablet Take 1 tablet (240 mg total) by mouth at bedtime. 90 tablet 3   No facility-administered medications prior  to visit.     ROS Review of Systems  Constitutional: Positive for fatigue. Negative for activity change, appetite change, chills, diaphoresis and unexpected weight change.  HENT: Positive for congestion. Negative for mouth sores and sinus pressure.   Eyes: Negative for visual disturbance.  Respiratory: Positive for cough and chest tightness.   Cardiovascular: Positive for chest pain.  Gastrointestinal: Negative for abdominal pain and nausea.  Genitourinary: Negative for difficulty urinating, frequency and vaginal pain.  Musculoskeletal: Positive for arthralgias. Negative for back pain and gait problem.  Skin: Negative for pallor and rash.  Neurological: Positive for  weakness. Negative for dizziness, tremors, numbness and headaches.  Psychiatric/Behavioral: Negative for confusion, sleep disturbance and suicidal ideas. The patient is nervous/anxious.     Objective:  BP 138/82 (BP Location: Left Arm, Patient Position: Sitting, Cuff Size: Large)   Pulse 79   Temp 98.6 F (37 C) (Oral)   Ht 5\' 2"  (1.575 m)   Wt 128 lb (58.1 kg)   SpO2 99%   BMI 23.41 kg/m   BP Readings from Last 3 Encounters:  09/27/16 138/82  09/23/16 140/72  07/09/16 138/70    Wt Readings from Last 3 Encounters:  09/27/16 128 lb (58.1 kg)  09/23/16 123 lb (55.8 kg)  07/09/16 121 lb (54.9 kg)    Physical Exam  Constitutional: She appears well-developed. No distress.  HENT:  Head: Normocephalic.  Right Ear: External ear normal.  Left Ear: External ear normal.  Nose: Nose normal.  Mouth/Throat: Oropharynx is clear and moist.  Eyes: Pupils are equal, round, and reactive to light. Conjunctivae are normal. Right eye exhibits no discharge. Left eye exhibits no discharge.  Neck: Normal range of motion. Neck supple. No JVD present. No tracheal deviation present. No thyromegaly present.  Cardiovascular: Normal rate, regular rhythm and normal heart sounds.   Pulmonary/Chest: No stridor. No respiratory distress. She has no wheezes.  Abdominal: Soft. Bowel sounds are normal. She exhibits no distension and no mass. There is no tenderness. There is no rebound and no guarding.  Musculoskeletal: She exhibits no edema or tenderness.  Lymphadenopathy:    She has no cervical adenopathy.  Neurological: She displays normal reflexes. No cranial nerve deficit. She exhibits normal muscle tone. Coordination normal.  Skin: No rash noted. No erythema.  Psychiatric: She has a normal mood and affect. Her behavior is normal. Judgment and thought content normal.  Coughing Cane  Lab Results  Component Value Date   WBC 10.0 05/31/2016   HGB 14.6 05/31/2016   HCT 43.0 05/31/2016   PLT 365.0  05/31/2016   GLUCOSE 103 (H) 04/20/2016   CHOL 242 (H) 12/06/2014   TRIG 98.0 12/06/2014   HDL 69.60 12/06/2014   LDLDIRECT 165.0 09/26/2009   LDLCALC 153 (H) 12/06/2014   ALT 7 04/20/2016   AST 12 04/20/2016   NA 139 04/20/2016   K 4.1 04/20/2016   CL 103 04/20/2016   CREATININE 0.58 04/20/2016   BUN 9 04/20/2016   CO2 34 (H) 04/20/2016   TSH 2.83 04/20/2016   INR 1.0 ratio 11/07/2009    Ct Chest High Resolution  Result Date: 06/28/2016 CLINICAL DATA:  66 year old female with history of asthma and Sjogren syndrome. Evaluate for potential interstitial lung disease. EXAM: CT CHEST WITHOUT CONTRAST TECHNIQUE: Multidetector CT imaging of the chest was performed following the standard protocol without intravenous contrast. High resolution imaging of the lungs, as well as inspiratory and expiratory imaging, was performed. COMPARISON:  Chest CT 10/10/2015. FINDINGS: Cardiovascular: Heart size  is normal. There is no significant pericardial fluid, thickening or pericardial calcification. There is aortic atherosclerosis, as well as atherosclerosis of the great vessels of the mediastinum and the coronary arteries, including calcified atherosclerotic plaque in the left main, left anterior descending, left circumflex and right coronary arteries. Mediastinum/Nodes: No pathologically enlarged mediastinal or hilar lymph nodes. Please note that accurate exclusion of hilar adenopathy is limited on noncontrast CT scans. Esophagus is unremarkable in appearance. No axillary lymphadenopathy. Lungs/Pleura: There are few scattered areas of peripheral bronchiolectasis and peripheral architectural distortion with volume loss associated with regions of mucoid impaction. This is rather mild, most evident in the inferior segment of the lingula and in the right middle lobe. A few other scattered areas of mild mucoid impaction are noted, particularly in the upper lobes of the lungs near the apices. High-resolution images  demonstrate no more generalized areas of ground-glass attenuation, subpleural reticulation, parenchymal banding, traction bronchiectasis or frank honeycombing to suggest interstitial lung disease. Inspiratory and expiratory imaging demonstrates some very mild air trapping, indicative of mild small airways disease. No suspicious appearing pulmonary nodules or masses are noted. No pleural effusions. Upper Abdomen: Postoperative changes just below the gastroesophageal junction, likely from prior fundoplication procedure. Aortic atherosclerosis. Musculoskeletal: There are no aggressive appearing lytic or blastic lesions noted in the visualized portions of the skeleton. IMPRESSION: 1. No evidence of interstitial lung disease. 2. 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 This appears very similar to prior study 10/02/2015. 3. Aortic atherosclerosis, in addition to left main and 3 vessel coronary artery disease. Please note that although the presence of coronary artery calcium documents the presence of coronary artery disease, the severity of this disease and any potential stenosis cannot be assessed on this non-gated CT examination. Assessment for potential risk factor modification, dietary therapy or pharmacologic therapy may be warranted, if clinically indicated. Electronically Signed   By: Vinnie Langton M.D.   On: 06/28/2016 15:38    Assessment & Plan:   There are no diagnoses linked to this encounter. I am having Ms. Mikaelian maintain her phenytoin, Docusate Calcium (STOOL SOFTENER PO), Cholecalciferol, sodium chloride, cyanocobalamin, Ipratropium-Albuterol, promethazine, Insulin Syringe-Needle U-100, magic mouthwash, escitalopram, verapamil, traZODone, levothyroxine, furosemide,  cyclobenzaprine, clonazePAM, levofloxacin, acetaminophen-codeine, pravastatin, promethazine-codeine, FLUTTER, albuterol, and sodium chloride HYPERTONIC.  No orders of the defined types were placed in this encounter.    Follow-up: No Follow-up on file.  Walker Kehr, MD

## 2016-09-27 NOTE — Assessment & Plan Note (Signed)
Ref to [employer]

## 2016-09-27 NOTE — Assessment & Plan Note (Signed)
F/u w/Dr Ashok Cordia soonProm-cod syr Cont w/MDIs

## 2016-09-28 LAB — LIPID PANEL
CHOLESTEROL TOTAL: 228 mg/dL — AB (ref 100–199)
Chol/HDL Ratio: 3.6 ratio (ref 0.0–4.4)
HDL: 63 mg/dL (ref >39)
LDL Calculated: 149 mg/dL — ABNORMAL HIGH (ref 0–99)
TRIGLYCERIDES: 81 mg/dL (ref 0–149)
VLDL CHOLESTEROL CAL: 16 mg/dL (ref 5–40)

## 2016-10-04 ENCOUNTER — Telehealth: Payer: Self-pay

## 2016-10-04 NOTE — Telephone Encounter (Signed)
PA started on CoverMyMeds KEY: [id number]

## 2016-10-05 ENCOUNTER — Telehealth: Payer: Self-pay

## 2016-10-05 ENCOUNTER — Telehealth (HOSPITAL_COMMUNITY): Payer: Self-pay

## 2016-10-05 NOTE — Telephone Encounter (Signed)
Encounter complete. 

## 2016-10-05 NOTE — Telephone Encounter (Signed)
PA started on CoverMyMeds KEY: [id number]

## 2016-10-06 ENCOUNTER — Ambulatory Visit (INDEPENDENT_AMBULATORY_CARE_PROVIDER_SITE_OTHER): Payer: Medicare Other | Admitting: Pulmonary Disease

## 2016-10-06 ENCOUNTER — Encounter: Payer: Self-pay | Admitting: Pulmonary Disease

## 2016-10-06 VITALS — BP 130/80 | HR 80 | Ht 62.0 in | Wt 127.4 lb

## 2016-10-06 DIAGNOSIS — J45909 Unspecified asthma, uncomplicated: Secondary | ICD-10-CM | POA: Diagnosis not present

## 2016-10-06 DIAGNOSIS — J479 Bronchiectasis, uncomplicated: Secondary | ICD-10-CM | POA: Diagnosis not present

## 2016-10-06 DIAGNOSIS — I2510 Atherosclerotic heart disease of native coronary artery without angina pectoris: Secondary | ICD-10-CM

## 2016-10-06 DIAGNOSIS — K219 Gastro-esophageal reflux disease without esophagitis: Secondary | ICD-10-CM | POA: Diagnosis not present

## 2016-10-06 MED ORDER — BUDESONIDE 0.25 MG/2ML IN SUSP: 0.2500 mg | Freq: Two times a day (BID) | RESPIRATORY_TRACT | 3 refills | Status: DC

## 2016-10-06 MED ORDER — ALBUTEROL SULFATE HFA 108 (90 BASE) MCG/ACT IN AERS: 2.0000 | INHALATION_SPRAY | RESPIRATORY_TRACT | 3 refills | Status: DC | PRN
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
## 2016-10-06 NOTE — Patient Instructions (Addendum)
   Continue using your flutter valve & 3% saline twice daily.  I'm sending in a prescription for an inhaler to use as needed in place of your Combivent.  I'm starting you on Pulmicort (Budesonide) to use in your nebulizer twice daily.  Your breathing regiment is as follows:  1st do the Pulmicort  2nd do the 3% hypertonic saline  3rd use your Flutter Valve  Remember to remove any dentures or partials you have before you use your Pulmicort. Remember to brush your teeth & tongue after you use your Pulmicort as well as rinse, gargle & spit to keep from getting thrush in your mouth or on your tongue (a white film).   Let me know if you have any new breathing problems or questions before next appointment.

## 2016-10-07 ENCOUNTER — Ambulatory Visit (HOSPITAL_COMMUNITY)
Admission: RE | Admit: 2016-10-07 | Discharge: 2016-10-07 | Disposition: A | Discharge status: Home / Self Care | Payer: Medicare Other | Source: Ambulatory Visit | Attending: Cardiology | Admitting: Cardiology

## 2016-10-07 DIAGNOSIS — R072 Precordial pain: Secondary | ICD-10-CM | POA: Diagnosis present

## 2016-10-07 LAB — MYOCARDIAL PERFUSION IMAGING
CHL CUP NUCLEAR SSS: 4
LVDIAVOL: 70 mL (ref 46–106)
LVSYSVOL: 28 mL
Peak HR: 84 {beats}/min
Rest HR: 65 {beats}/min
SDS: 2
SRS: 2
TID: 0.89

## 2016-10-07 MED ORDER — TECHNETIUM TC 99M TETROFOSMIN IV KIT
10.5000 | PACK | Freq: Once | INTRAVENOUS | Status: AC | PRN
  Administered 2016-10-07: 10.5 via INTRAVENOUS
  Filled 2016-10-07: qty 11

## 2016-10-07 MED ORDER — AMINOPHYLLINE 25 MG/ML IV SOLN
75.0000 mg | Freq: Once | INTRAVENOUS | Status: AC
  Administered 2016-10-07: 75 mg via INTRAVENOUS

## 2016-10-07 MED ORDER — REGADENOSON 0.4 MG/5ML IV SOLN
0.4000 mg | Freq: Once | INTRAVENOUS | Status: AC
  Administered 2016-10-07: 0.4 mg via INTRAVENOUS

## 2016-10-07 MED ORDER — TECHNETIUM TC 99M TETROFOSMIN IV KIT
30.7000 | PACK | Freq: Once | INTRAVENOUS | Status: AC | PRN
  Administered 2016-10-07: 30.7 via INTRAVENOUS
  Filled 2016-10-07: qty 31

## 2016-10-08 ENCOUNTER — Telehealth: Payer: Self-pay

## 2016-10-08 DIAGNOSIS — Z79899 Other long term (current) drug therapy: Secondary | ICD-10-CM

## 2016-10-08 DIAGNOSIS — E785 Hyperlipidemia, unspecified: Secondary | ICD-10-CM

## 2016-10-08 MED ORDER — ATORVASTATIN CALCIUM 40 MG PO TABS: 40.0000 mg | ORAL_TABLET | Freq: Every day | ORAL | 11 refills | Status: DC

## 2016-10-08 NOTE — Telephone Encounter (Signed)
-----   Message from Sanda Klein, MD sent at 10/05/2016  9:02 AM EDT ----- LDL cholesterol is very high considering the presence of atherosclerosis in her aorta and coronaries, even if we do not find any serious blockages. This is the highest dose of pravastatin and is not getting the job done. I would recommend switching to atorvastatin 40 mg daily or rosuvastatin 20 mg daily and recheck in 3 months.

## 2016-10-08 NOTE — Telephone Encounter (Signed)
Called patient with results. Patient verbalized understanding and agreed with plan.  Medication list updated. New rx for Atorvastatin 40 mg QD sent to patient's preferred pharmacy electronically. Repeat lab ordered to be completed in 3 months.   Written instructions mailed to patient. See letter from 10/08/16 for further details.

## 2016-10-15 ENCOUNTER — Telehealth: Payer: Self-pay

## 2016-10-15 NOTE — Telephone Encounter (Signed)
Prescription form has been signed and mailed to [facility] for Albuterol 0.083%. Copy of form has been made and placed in JN's scan folder to be scanned into pt's chart. Nothing further needed.

## 2016-10-19 ENCOUNTER — Ambulatory Visit (INDEPENDENT_AMBULATORY_CARE_PROVIDER_SITE_OTHER): Payer: Medicare Other | Admitting: Psychology

## 2016-10-19 DIAGNOSIS — F4323 Adjustment disorder with mixed anxiety and depressed mood: Secondary | ICD-10-CM | POA: Diagnosis not present

## 2016-10-29 ENCOUNTER — Telehealth: Payer: Self-pay | Admitting: Cardiovascular Disease

## 2016-10-29 NOTE — Telephone Encounter (Signed)
New message     Pt is calling to find out if she needs to get her lab work before her appt in September or in 3 months from last visit. Please call.

## 2016-10-29 NOTE — Telephone Encounter (Signed)
Per last OV note: return for lab work in at your earliest [place].  Pt notified she states that she will return for fasting lab at least 3 to a week before appt.

## 2016-11-05 ENCOUNTER — Telehealth: Payer: Self-pay | Admitting: Internal Medicine

## 2016-11-05 MED ORDER — CYCLOBENZAPRINE HCL 10 MG PO TABS: 10.0000 mg | ORAL_TABLET | Freq: Three times a day (TID) | ORAL | 0 refills | Status: DC | PRN

## 2016-11-05 NOTE — Telephone Encounter (Signed)
Reviewed chart pt is up-to-date sent refills to requested pharmacy.../lmb  

## 2016-11-05 NOTE — Telephone Encounter (Signed)
Pt called for a refill of her cyclobenzaprine (FLEXERIL) 10 MG tablet  Last seen 09/2016 Please advise  [facility] in [place]

## 2016-11-08 ENCOUNTER — Ambulatory Visit (INDEPENDENT_AMBULATORY_CARE_PROVIDER_SITE_OTHER): Payer: Medicare Other | Admitting: Psychology

## 2016-11-08 DIAGNOSIS — F331 Major depressive disorder, recurrent, moderate: Secondary | ICD-10-CM | POA: Diagnosis not present

## 2016-11-16 ENCOUNTER — Other Ambulatory Visit: Payer: Self-pay | Admitting: Pulmonary Disease

## 2016-11-23 ENCOUNTER — Ambulatory Visit (INDEPENDENT_AMBULATORY_CARE_PROVIDER_SITE_OTHER): Payer: Medicare Other | Admitting: Psychology

## 2016-11-23 DIAGNOSIS — F331 Major depressive disorder, recurrent, moderate: Secondary | ICD-10-CM

## 2016-11-30 ENCOUNTER — Telehealth: Payer: Self-pay | Admitting: Internal Medicine

## 2016-11-30 LAB — LIPID PANEL
CHOLESTEROL TOTAL: 175 mg/dL (ref 100–199)
Chol/HDL Ratio: 2.8 ratio (ref 0.0–4.4)
HDL: 63 mg/dL (ref >39)
LDL Calculated: 96 mg/dL (ref 0–99)
TRIGLYCERIDES: 81 mg/dL (ref 0–149)
VLDL Cholesterol Cal: 16 mg/dL (ref 5–40)

## 2016-11-30 MED ORDER — ACETAMINOPHEN-CODEINE #3 300-30 MG PO TABS
1.0000 | ORAL_TABLET | Freq: Four times a day (QID) | ORAL | 3 refills | Status: DC | PRN
Start: 2016-11-30 — End: 2017-02-11

## 2016-11-30 MED ORDER — PROMETHAZINE-CODEINE 6.25-10 MG/5ML PO SYRP: 5.0000 mL | ORAL_SOLUTION | Freq: Four times a day (QID) | ORAL | 1 refills | Status: DC | PRN

## 2016-11-30 NOTE — Telephone Encounter (Signed)
Ok to renew both [employer]

## 2016-11-30 NOTE — Telephone Encounter (Signed)
Pt came by the office to schedule an appointment with Dr Alain Marion. She also said that she is needing a refill on acetaminophen-codeine (TYLENOL #3) 300-30 MG tablet and promethazine-codeine (PHENERGAN WITH CODEINE) 6.25-10 MG/5ML syrup. She is in [place] this morning and going to have blood work done at [facility] on [address]. She is wanting to know if this could be done this morning so that she can come back by to pick it up when she is done. I told her that it would have to go through Dr Alain Marion who has patients this morning. Please advise.

## 2016-11-30 NOTE — Telephone Encounter (Signed)
RX printed and given to pt 

## 2016-12-03 ENCOUNTER — Ambulatory Visit (INDEPENDENT_AMBULATORY_CARE_PROVIDER_SITE_OTHER): Payer: Medicare Other | Admitting: Internal Medicine

## 2016-12-03 ENCOUNTER — Other Ambulatory Visit (INDEPENDENT_AMBULATORY_CARE_PROVIDER_SITE_OTHER): Payer: Medicare Other

## 2016-12-03 ENCOUNTER — Encounter: Payer: Self-pay | Admitting: Internal Medicine

## 2016-12-03 VITALS — BP 132/70 | HR 62 | Temp 98.8°F | Ht 62.0 in | Wt 126.0 lb

## 2016-12-03 DIAGNOSIS — E559 Vitamin D deficiency, unspecified: Secondary | ICD-10-CM

## 2016-12-03 DIAGNOSIS — E890 Postprocedural hypothyroidism: Secondary | ICD-10-CM

## 2016-12-03 DIAGNOSIS — I10 Essential (primary) hypertension: Secondary | ICD-10-CM

## 2016-12-03 DIAGNOSIS — F329 Major depressive disorder, single episode, unspecified: Secondary | ICD-10-CM | POA: Diagnosis not present

## 2016-12-03 DIAGNOSIS — I2510 Atherosclerotic heart disease of native coronary artery without angina pectoris: Secondary | ICD-10-CM

## 2016-12-03 DIAGNOSIS — E538 Deficiency of other specified B group vitamins: Secondary | ICD-10-CM

## 2016-12-03 DIAGNOSIS — T148XXA Other injury of unspecified body region, initial encounter: Secondary | ICD-10-CM | POA: Diagnosis not present

## 2016-12-03 DIAGNOSIS — J45909 Unspecified asthma, uncomplicated: Secondary | ICD-10-CM

## 2016-12-03 DIAGNOSIS — G4709 Other insomnia: Secondary | ICD-10-CM | POA: Diagnosis not present

## 2016-12-03 LAB — HEPATIC FUNCTION PANEL
ALBUMIN: 4.3 g/dL (ref 3.5–5.2)
ALT: 7 U/L (ref 0–35)
AST: 15 U/L (ref 0–37)
Alkaline Phosphatase: 101 U/L (ref 39–117)
Bilirubin, Direct: 0.1 mg/dL (ref 0.0–0.3)
Total Bilirubin: 0.3 mg/dL (ref 0.2–1.2)
Total Protein: 7.3 g/dL (ref 6.0–8.3)

## 2016-12-03 LAB — BASIC METABOLIC PANEL
BUN: 7 mg/dL (ref 6–23)
CALCIUM: 9.1 mg/dL (ref 8.4–10.5)
CHLORIDE: 100 meq/L (ref 96–112)
CO2: 32 mEq/L (ref 19–32)
CREATININE: 0.6 mg/dL (ref 0.40–1.20)
GFR: 105.76 mL/min (ref >60.00)
Glucose, Bld: 92 mg/dL (ref 70–99)
Potassium: 4.6 mEq/L (ref 3.5–5.1)
Sodium: 139 mEq/L (ref 135–145)

## 2016-12-03 LAB — TSH: TSH: 1.59 u[IU]/mL (ref 0.35–4.50)

## 2016-12-03 NOTE — Assessment & Plan Note (Signed)
On Vit D 

## 2016-12-03 NOTE — Assessment & Plan Note (Signed)
Try Vitamin C 500 mg a day for bruising

## 2016-12-03 NOTE — Assessment & Plan Note (Signed)
C/o diarrhea, nausea w/Lipitor - try Lipitor 1/2 qod

## 2016-12-03 NOTE — Assessment & Plan Note (Signed)
Clonazepam prn  Potential benefits of a long term benzodiazepines  use as well as potential risks  and complications were explained to the patient and were aknowledged.  

## 2016-12-03 NOTE — Assessment & Plan Note (Signed)
On B12 

## 2016-12-03 NOTE — Assessment & Plan Note (Signed)
Levothroid 88 mcg 

## 2016-12-03 NOTE — Patient Instructions (Addendum)
Try Vitamin C 500 mg a day for bruising

## 2016-12-03 NOTE — Progress Notes (Signed)
Subjective:  Patient ID: Veronica [name], female    DOB: 15-Feb-1951  Age: 66 y.o. MRN: [id number]  CC: No chief complaint on file.   HPI Veronica [name] presents for COPD, cough, anxiety f/u. C/o diarrhea, nausea w/Lipitor  Outpatient Medications Prior to Visit  Medication Sig Dispense Refill  . acetaminophen-codeine (TYLENOL #3) 300-30 MG tablet Take 1 tablet by mouth every 6 (six) hours as needed for severe pain. 60 tablet 3  . albuterol (PROVENTIL) (2.5 MG/3ML) 0.083% nebulizer solution Take 3 mLs (2.5 mg total) by nebulization 2 (two) times daily. 60 mL 3  . albuterol (VENTOLIN HFA) 108 (90 Base) MCG/ACT inhaler Inhale 2 puffs into the lungs every 4 (four) hours as needed for wheezing or shortness of breath. 1 Inhaler 3  . atorvastatin (LIPITOR) 40 MG tablet Take 1 tablet (40 mg total) by mouth daily. 30 tablet 11  . budesonide (PULMICORT) 0.25 MG/2ML nebulizer solution Take 2 mLs (0.25 mg total) by nebulization 2 (two) times daily. 120 mL 3  . Cholecalciferol 1000 units tablet Take 1,000 Units by mouth daily.    . clonazePAM (KLONOPIN) 1 MG tablet 2 po qhs and 1/2 tab in addition in daytime prn 225 tablet 1  . cyanocobalamin (,VITAMIN B-12,) 1000 MCG/ML injection Inject 1 mL (1,000 mcg total) into the muscle every 14 (fourteen) days. 6 mL 11  . cyclobenzaprine (FLEXERIL) 10 MG tablet Take 1 tablet (10 mg total) by mouth 3 (three) times daily as needed for muscle spasms. 90 tablet 0  . Docusate Calcium (STOOL SOFTENER PO) Take 100 mg by mouth as needed (for constipation).     [name] escitalopram (LEXAPRO) 20 MG tablet Take 1 tablet (20 mg total) by mouth daily. 90 tablet 3  . furosemide (LASIX) 20 MG tablet Take 1 tablet (20 mg total) by mouth daily as needed. 90 tablet 3  . Insulin Syringe-Needle U-100 (B-D INSULIN SYRINGE 1CC/26G) 26G X 1/2" 1 ML MISC Inject 1 each into the skin as directed. 100 each 5  . levothyroxine (SYNTHROID, LEVOTHROID) 88 MCG tablet Take 1 tablet (88 mcg total) by  mouth daily before breakfast. 90 tablet 3  . magic mouthwash SOLN Take 5 mLs by mouth 4 (four) times daily. Swish, hold and spit out---Do  Not swallow.  100 ml of dexamethasone 0.5 mg per 5 ml elixir 60 ml nystatin 100,000 Unit 100 ml of diphenhydramine 12.5 mg per 5 ml elixir 260 mL 3  . phenytoin (DILANTIN) 100 MG ER capsule Take 100-200 mg by mouth 2 (two) times daily. 200 mg every morning and 100 mg every evening, DILANTIN "brand name" only    . promethazine (PHENERGAN) 25 MG tablet Take 0.5 tablets (12.5 mg total) by mouth every 6 (six) hours as needed. 60 tablet 1  . promethazine-codeine (PHENERGAN WITH CODEINE) 6.25-10 MG/5ML syrup Take 5 mLs by mouth every 6 (six) hours as needed for cough. 300 mL 1  . Respiratory Therapy Supplies (FLUTTER) DEVI Use after nebulization treatments 1 each 0  . sodium chloride (MURO 128) 5 % ophthalmic solution Place 1 drop into both eyes as needed for irritation.     . sodium chloride HYPERTONIC 3 % nebulizer solution USE 1 VIAL VIA NEBULIZER TWICE DAILY 240 mL 1  . traZODone (DESYREL) 50 MG tablet Take 4 tablets (200 mg total) by mouth at bedtime. 360 tablet 3  . verapamil (CALAN-SR) 240 MG CR tablet Take 1 tablet (240 mg total) by mouth at bedtime. 90 tablet 3  No facility-administered medications prior to visit.     ROS Review of Systems  Constitutional: Positive for fatigue. Negative for activity change, appetite change, chills and unexpected weight change.  HENT: Negative for congestion, mouth sores and sinus pressure.   Eyes: Negative for visual disturbance.  Respiratory: Positive for cough. Negative for chest tightness.   Gastrointestinal: Negative for abdominal pain and nausea.  Genitourinary: Negative for difficulty urinating, frequency and vaginal pain.  Musculoskeletal: Positive for arthralgias, back pain and gait problem.  Skin: Negative for pallor and rash.  Neurological: Negative for dizziness, tremors, weakness, numbness and  headaches.  Psychiatric/Behavioral: Negative for confusion and sleep disturbance. The patient is nervous/anxious.     Objective:  BP 132/70 (BP Location: Left Arm, Patient Position: Sitting, Cuff Size: Normal)   Pulse 62   Temp 98.8 F (37.1 C) (Oral)   Ht 5\' 2"  (1.575 m)   Wt 126 lb (57.2 kg)   SpO2 98%   BMI 23.05 kg/m   BP Readings from Last 3 Encounters:  12/03/16 132/70  10/06/16 130/80  09/27/16 138/82    Wt Readings from Last 3 Encounters:  12/03/16 126 lb (57.2 kg)  10/07/16 128 lb (58.1 kg)  10/06/16 127 lb 6.4 oz (57.8 kg)    Physical Exam  Constitutional: She appears well-developed. No distress.  HENT:  Head: Normocephalic.  Right Ear: External ear normal.  Left Ear: External ear normal.  Nose: Nose normal.  Mouth/Throat: Oropharynx is clear and moist.  Eyes: Pupils are equal, round, and reactive to light. Conjunctivae are normal. Right eye exhibits no discharge. Left eye exhibits no discharge.  Neck: Normal range of motion. Neck supple. No JVD present. No tracheal deviation present. No thyromegaly present.  Cardiovascular: Normal rate, regular rhythm and normal heart sounds.   Pulmonary/Chest: No stridor. No respiratory distress. She has no wheezes.  Abdominal: Soft. Bowel sounds are normal. She exhibits no distension and no mass. There is no tenderness. There is no rebound and no guarding.  Musculoskeletal: She exhibits tenderness. She exhibits no edema.  Lymphadenopathy:    She has no cervical adenopathy.  Neurological: She displays normal reflexes. No cranial nerve deficit. She exhibits normal muscle tone. Coordination abnormal.  Skin: No rash noted. No erythema.  Psychiatric: She has a normal mood and affect. Her behavior is normal. Judgment and thought content normal.  cane  Lab Results  Component Value Date   WBC 10.0 05/31/2016   HGB 14.6 05/31/2016   HCT 43.0 05/31/2016   PLT 365.0 05/31/2016   GLUCOSE 103 (H) 04/20/2016   CHOL 175 11/30/2016    TRIG 81 11/30/2016   HDL 63 11/30/2016   LDLDIRECT 165.0 09/26/2009   LDLCALC 96 11/30/2016   ALT 7 04/20/2016   AST 12 04/20/2016   NA 139 04/20/2016   K 4.1 04/20/2016   CL 103 04/20/2016   CREATININE 0.58 04/20/2016   BUN 9 04/20/2016   CO2 34 (H) 04/20/2016   TSH 2.83 04/20/2016   INR 1.0 ratio 11/07/2009    No results found.  Assessment & Plan:   There are no diagnoses linked to this encounter. I am having Ms. Cerullo maintain her phenytoin, Docusate Calcium (STOOL SOFTENER PO), Cholecalciferol, sodium chloride, cyanocobalamin, promethazine, Insulin Syringe-Needle U-100, magic mouthwash, escitalopram, verapamil, furosemide, FLUTTER, albuterol, clonazePAM, levothyroxine, traZODone, albuterol, budesonide, atorvastatin, cyclobenzaprine, sodium chloride HYPERTONIC, promethazine-codeine, and acetaminophen-codeine.  No orders of the defined types were placed in this encounter.    Follow-up: No Follow-up on file.  Alex Plotnikov,  MD

## 2016-12-03 NOTE — Assessment & Plan Note (Signed)
Pulmicort, Combivent Prom-cod syr

## 2016-12-03 NOTE — Assessment & Plan Note (Signed)
On Lexapro 

## 2016-12-03 NOTE — Assessment & Plan Note (Signed)
Calan SR 

## 2016-12-06 ENCOUNTER — Encounter: Payer: Self-pay | Admitting: Cardiovascular Disease

## 2016-12-06 ENCOUNTER — Other Ambulatory Visit: Payer: Self-pay | Admitting: Cardiovascular Disease

## 2016-12-06 ENCOUNTER — Ambulatory Visit (INDEPENDENT_AMBULATORY_CARE_PROVIDER_SITE_OTHER): Payer: Medicare Other | Admitting: Cardiovascular Disease

## 2016-12-06 VITALS — BP 180/80 | HR 56 | Ht 62.0 in | Wt 125.0 lb

## 2016-12-06 DIAGNOSIS — E785 Hyperlipidemia, unspecified: Secondary | ICD-10-CM

## 2016-12-06 DIAGNOSIS — I2510 Atherosclerotic heart disease of native coronary artery without angina pectoris: Secondary | ICD-10-CM

## 2016-12-06 DIAGNOSIS — I10 Essential (primary) hypertension: Secondary | ICD-10-CM

## 2016-12-06 DIAGNOSIS — R072 Precordial pain: Secondary | ICD-10-CM | POA: Diagnosis not present

## 2016-12-06 DIAGNOSIS — I700 Atherosclerosis of aorta: Secondary | ICD-10-CM

## 2016-12-06 MED ORDER — NITROGLYCERIN 0.4 MG SL SUBL: 0.4000 mg | SUBLINGUAL_TABLET | SUBLINGUAL | 3 refills | Status: DC | PRN
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
## 2016-12-06 NOTE — Patient Instructions (Addendum)
Your physician has recommended you make the following change in your medication: 1. USE Nitroglycerin - place 1 tablet underneath your tongue as needed, may take up to 3 doses  Dr Sallyanne Kuster recommends that you schedule a follow-up appointment in 6 months. You will receive a reminder letter in the mail two months in advance. If you don't receive a letter, please call our office to schedule the follow-up appointment.  If you need a refill on your cardiac medications before your next appointment, please call your pharmacy.  Nitroglycerin sublingual tablets What is this medicine? NITROGLYCERIN (nye troe GLI ser in) is a type of vasodilator. It relaxes blood vessels, increasing the blood and oxygen supply to your heart. This medicine is used to relieve chest pain caused by angina. It is also used to prevent chest pain before activities like climbing stairs, going outdoors in cold weather, or sexual activity. This medicine may be used for other purposes; ask your health care provider or pharmacist if you have questions. COMMON BRAND NAME(S): Nitroquick, Nitrostat, Nitrotab What should I tell my health care provider before I take this medicine? They need to know if you have any of these conditions: -anemia -head injury, recent stroke, or bleeding in the brain -liver disease -previous heart attack -an unusual or allergic reaction to nitroglycerin, other medicines, foods, dyes, or preservatives -pregnant or trying to get pregnant -breast-feeding How should I use this medicine? Take this medicine by mouth as needed. At the first sign of an angina attack (chest pain or tightness) place one tablet under your tongue. You can also take this medicine 5 to 10 minutes before an event likely to produce chest pain. Follow the directions on the prescription label. Let the tablet dissolve under the tongue. Do not swallow whole. Replace the dose if you accidentally swallow it. It will help if your mouth is not dry.  Saliva around the tablet will help it to dissolve more quickly. Do not eat or drink, smoke or chew tobacco while a tablet is dissolving. If you are not better within 5 minutes after taking ONE dose of nitroglycerin, call 9-1-1 immediately to seek emergency medical care. Do not take more than 3 nitroglycerin tablets over 15 minutes. If you take this medicine often to relieve symptoms of angina, your doctor or health care professional may provide you with different instructions to manage your symptoms. If symptoms do not go away after following these instructions, it is important to call 9-1-1 immediately. Do not take more than 3 nitroglycerin tablets over 15 minutes. Talk to your pediatrician regarding the use of this medicine in children. Special care may be needed. Overdosage: If you think you have taken too much of this medicine contact a poison control center or emergency room at once. NOTE: This medicine is only for you. Do not share this medicine with others. What if I miss a dose? This does not apply. This medicine is only used as needed. What may interact with this medicine? Do not take this medicine with any of the following medications: -certain migraine medicines like ergotamine and dihydroergotamine (DHE) -medicines used to treat erectile dysfunction like sildenafil, tadalafil, and vardenafil -riociguat This medicine may also interact with the following medications: -alteplase -aspirin -heparin -medicines for high blood pressure -medicines for mental depression -other medicines used to treat angina -phenothiazines like chlorpromazine, mesoridazine, prochlorperazine, thioridazine This list may not describe all possible interactions. Give your health care provider a list of all the medicines, herbs, non-prescription drugs, or dietary supplements  you use. Also tell them if you smoke, drink alcohol, or use illegal drugs. Some items may interact with your medicine. What should I watch for  while using this medicine? Tell your doctor or health care professional if you feel your medicine is no longer working. Keep this medicine with you at all times. Sit or lie down when you take your medicine to prevent falling if you feel dizzy or faint after using it. Try to remain calm. This will help you to feel better faster. If you feel dizzy, take several deep breaths and lie down with your feet propped up, or bend forward with your head resting between your knees. You may get drowsy or dizzy. Do not drive, use machinery, or do anything that needs mental alertness until you know how this drug affects you. Do not stand or sit up quickly, especially if you are an older patient. This reduces the risk of dizzy or fainting spells. Alcohol can make you more drowsy and dizzy. Avoid alcoholic drinks. Do not treat yourself for coughs, colds, or pain while you are taking this medicine without asking your doctor or health care professional for advice. Some ingredients may increase your blood pressure. What side effects may I notice from receiving this medicine? Side effects that you should report to your doctor or health care professional as soon as possible: -blurred vision -dry mouth -skin rash -sweating -the feeling of extreme pressure in the head -unusually weak or tired Side effects that usually do not require medical attention (report to your doctor or health care professional if they continue or are bothersome): -flushing of the face or neck -headache -irregular heartbeat, palpitations -nausea, vomiting This list may not describe all possible side effects. Call your doctor for medical advice about side effects. You may report side effects to FDA at 1-800-FDA-1088. Where should I keep my medicine? Keep out of the reach of children. Store at room temperature between 20 and 25 degrees C (68 and 77 degrees F). Store in [employer]. Protect from light and moisture. Keep tightly closed. Throw  away any unused medicine after the expiration date. NOTE: This sheet is a summary. It may not cover all possible information. If you have questions about this medicine, talk to your doctor, pharmacist, or health care provider.  2018 Elsevier/Gold Standard (2012-12-28 17:57:36)

## 2016-12-08 ENCOUNTER — Encounter: Payer: Self-pay | Admitting: Internal Medicine

## 2016-12-09 ENCOUNTER — Ambulatory Visit: Payer: Medicare Other | Admitting: Psychology

## 2016-12-13 ENCOUNTER — Ambulatory Visit (INDEPENDENT_AMBULATORY_CARE_PROVIDER_SITE_OTHER): Payer: Medicare Other | Admitting: Psychology

## 2016-12-13 DIAGNOSIS — F331 Major depressive disorder, recurrent, moderate: Secondary | ICD-10-CM

## 2016-12-17 ENCOUNTER — Encounter: Payer: Self-pay | Admitting: Internal Medicine

## 2016-12-17 ENCOUNTER — Ambulatory Visit (INDEPENDENT_AMBULATORY_CARE_PROVIDER_SITE_OTHER): Payer: Medicare Other | Admitting: Internal Medicine

## 2016-12-17 DIAGNOSIS — E559 Vitamin D deficiency, unspecified: Secondary | ICD-10-CM | POA: Diagnosis not present

## 2016-12-17 DIAGNOSIS — E538 Deficiency of other specified B group vitamins: Secondary | ICD-10-CM | POA: Diagnosis not present

## 2016-12-17 DIAGNOSIS — E890 Postprocedural hypothyroidism: Secondary | ICD-10-CM

## 2016-12-17 DIAGNOSIS — F419 Anxiety disorder, unspecified: Secondary | ICD-10-CM | POA: Diagnosis not present

## 2016-12-17 DIAGNOSIS — S060X9S Concussion with loss of consciousness of unspecified duration, sequela: Secondary | ICD-10-CM | POA: Diagnosis not present

## 2016-12-17 DIAGNOSIS — I25111 Atherosclerotic heart disease of native coronary artery with angina pectoris with documented spasm: Secondary | ICD-10-CM

## 2016-12-17 DIAGNOSIS — I700 Atherosclerosis of aorta: Secondary | ICD-10-CM

## 2016-12-17 DIAGNOSIS — J42 Unspecified chronic bronchitis: Secondary | ICD-10-CM | POA: Diagnosis not present

## 2016-12-17 DIAGNOSIS — E875 Hyperkalemia: Secondary | ICD-10-CM | POA: Diagnosis not present

## 2016-12-17 DIAGNOSIS — L509 Urticaria, unspecified: Secondary | ICD-10-CM | POA: Diagnosis not present

## 2016-12-17 MED ORDER — "INSULIN SYRINGE-NEEDLE U-100 26G X 1/2"" 1 ML MISC": 1.0000 | 5 refills | Status: DC

## 2016-12-17 MED ORDER — CLONAZEPAM 1 MG PO TABS: ORAL_TABLET | ORAL | 1 refills | Status: DC

## 2016-12-17 MED ORDER — ESCITALOPRAM OXALATE 20 MG PO TABS: 20.0000 mg | ORAL_TABLET | Freq: Every day | ORAL | 3 refills | Status: DC

## 2016-12-17 MED ORDER — TRAZODONE HCL 50 MG PO TABS: 200.0000 mg | ORAL_TABLET | Freq: Every day | ORAL | 3 refills | Status: DC

## 2016-12-17 MED ORDER — CYANOCOBALAMIN 1000 MCG/ML IJ SOLN: 1000.0000 ug | INTRAMUSCULAR | 11 refills | Status: DC

## 2016-12-17 MED ORDER — PHENYTOIN SODIUM EXTENDED 100 MG PO CAPS: ORAL_CAPSULE | ORAL | 2 refills | Status: DC

## 2016-12-17 NOTE — Progress Notes (Signed)
Subjective:  Patient ID: Veronica [name], female    DOB: 1951/02/14  Age: 66 y.o. MRN: [id number]  CC: No chief complaint on file.   HPI Veronica [name] presents for insomnia, cough, B12 def, pain f/u. Pt declined a flu shot  Outpatient Medications Prior to Visit  Medication Sig Dispense Refill  . acetaminophen-codeine (TYLENOL #3) 300-30 MG tablet Take 1 tablet by mouth every 6 (six) hours as needed for severe pain. 60 tablet 3  . albuterol (PROVENTIL) (2.5 MG/3ML) 0.083% nebulizer solution Take 3 mLs (2.5 mg total) by nebulization 2 (two) times daily. 60 mL 3  . albuterol (VENTOLIN HFA) 108 (90 Base) MCG/ACT inhaler Inhale 2 puffs into the lungs every 4 (four) hours as needed for wheezing or shortness of breath. 1 Inhaler 3  . atorvastatin (LIPITOR) 40 MG tablet Take 1 tablet (40 mg total) by mouth daily. 30 tablet 11  . budesonide (PULMICORT) 0.25 MG/2ML nebulizer solution Take 2 mLs (0.25 mg total) by nebulization 2 (two) times daily. 120 mL 3  . Cholecalciferol 1000 units tablet Take 1,000 Units by mouth daily.    . clonazePAM (KLONOPIN) 1 MG tablet 2 po qhs and 1/2 tab in addition in daytime prn 225 tablet 1  . cyanocobalamin (,VITAMIN B-12,) 1000 MCG/ML injection Inject 1 mL (1,000 mcg total) into the muscle every 14 (fourteen) days. 6 mL 11  . cyclobenzaprine (FLEXERIL) 10 MG tablet Take 1 tablet (10 mg total) by mouth 3 (three) times daily as needed for muscle spasms. 90 tablet 0  . Docusate Calcium (STOOL SOFTENER PO) Take 100 mg by mouth as needed (for constipation).     [name] escitalopram (LEXAPRO) 20 MG tablet Take 1 tablet (20 mg total) by mouth daily. 90 tablet 3  . furosemide (LASIX) 20 MG tablet Take 1 tablet (20 mg total) by mouth daily as needed. 90 tablet 3  . Insulin Syringe-Needle U-100 (B-D INSULIN SYRINGE 1CC/26G) 26G X 1/2" 1 ML MISC Inject 1 each into the skin as directed. 100 each 5  . levothyroxine (SYNTHROID, LEVOTHROID) 88 MCG tablet Take 1 tablet (88 mcg total)  by mouth daily before breakfast. 90 tablet 3  . magic mouthwash SOLN Take 5 mLs by mouth 4 (four) times daily. Swish, hold and spit out---Do  Not swallow.  100 ml of dexamethasone 0.5 mg per 5 ml elixir 60 ml nystatin 100,000 Unit 100 ml of diphenhydramine 12.5 mg per 5 ml elixir 260 mL 3  . nitroGLYCERIN (NITROSTAT) 0.4 MG SL tablet PLACE 1 TABLET UNDER THE TONGUE EVERY 5 MINUTES AS NEEDED FOR CHEST PAIN 275 tablet 3  . phenytoin (DILANTIN) 100 MG ER capsule Take 100-200 mg by mouth 2 (two) times daily. 200 mg every morning and 100 mg every evening, DILANTIN "brand name" only    . promethazine (PHENERGAN) 25 MG tablet Take 0.5 tablets (12.5 mg total) by mouth every 6 (six) hours as needed. 60 tablet 1  . promethazine-codeine (PHENERGAN WITH CODEINE) 6.25-10 MG/5ML syrup Take 5 mLs by mouth every 6 (six) hours as needed for cough. 300 mL 1  . Respiratory Therapy Supplies (FLUTTER) DEVI Use after nebulization treatments 1 each 0  . sodium chloride (MURO 128) 5 % ophthalmic solution Place 1 drop into both eyes as needed for irritation.     . sodium chloride HYPERTONIC 3 % nebulizer solution USE 1 VIAL VIA NEBULIZER TWICE DAILY 240 mL 1  . traZODone (DESYREL) 50 MG tablet Take 4 tablets (200 mg total) by  mouth at bedtime. 360 tablet 3  . verapamil (CALAN-SR) 240 MG CR tablet Take 1 tablet (240 mg total) by mouth at bedtime. 90 tablet 3   No facility-administered medications prior to visit.     ROS Review of Systems  Constitutional: Positive for fatigue. Negative for activity change, appetite change, chills and unexpected weight change.  HENT: Negative for congestion, mouth sores and sinus pressure.   Eyes: Negative for visual disturbance.  Respiratory: Positive for cough and shortness of breath. Negative for chest tightness.   Gastrointestinal: Positive for abdominal pain. Negative for nausea.  Genitourinary: Negative for difficulty urinating, frequency and vaginal pain.  Musculoskeletal:  Positive for arthralgias, back pain and gait problem.  Skin: Negative for pallor and rash.  Neurological: Negative for dizziness, tremors, weakness, numbness and headaches.  Psychiatric/Behavioral: Negative for confusion, sleep disturbance and suicidal ideas. The patient is nervous/anxious.     Objective:  BP (!) 164/74 (BP Location: Left Arm, Patient Position: Sitting, Cuff Size: Normal)   Pulse 67   Temp 98.2 F (36.8 C) (Oral)   Resp 16   Ht 5\' 2"  (1.575 m)   Wt 126 lb (57.2 kg)   SpO2 97%   BMI 23.05 kg/m   BP Readings from Last 3 Encounters:  12/17/16 (!) 164/74  12/06/16 (!) 180/80  12/03/16 132/70    Wt Readings from Last 3 Encounters:  12/17/16 126 lb (57.2 kg)  12/06/16 125 lb (56.7 kg)  12/03/16 126 lb (57.2 kg)    Physical Exam  Constitutional: She appears well-developed. No distress.  HENT:  Head: Normocephalic.  Right Ear: External ear normal.  Left Ear: External ear normal.  Nose: Nose normal.  Mouth/Throat: Oropharynx is clear and moist.  Eyes: Pupils are equal, round, and reactive to light. Conjunctivae are normal. Right eye exhibits no discharge. Left eye exhibits no discharge.  Neck: Normal range of motion. Neck supple. No JVD present. No tracheal deviation present. No thyromegaly present.  Cardiovascular: Normal rate, regular rhythm and normal heart sounds.   Pulmonary/Chest: No stridor. No respiratory distress. She has no wheezes.  Abdominal: Soft. Bowel sounds are normal. She exhibits no distension and no mass. There is no tenderness. There is no rebound and no guarding.  Musculoskeletal: She exhibits tenderness. She exhibits no edema.  Lymphadenopathy:    She has no cervical adenopathy.  Neurological: She displays normal reflexes. No cranial nerve deficit. She exhibits normal muscle tone. Coordination abnormal.  Skin: No rash noted. No erythema.  Psychiatric: She has a normal mood and affect. Her behavior is normal. Judgment and thought content  normal.  cane  Lab Results  Component Value Date   WBC 10.0 05/31/2016   HGB 14.6 05/31/2016   HCT 43.0 05/31/2016   PLT 365.0 05/31/2016   GLUCOSE 92 12/03/2016   CHOL 175 11/30/2016   TRIG 81 11/30/2016   HDL 63 11/30/2016   LDLDIRECT 165.0 09/26/2009   LDLCALC 96 11/30/2016   ALT 7 12/03/2016   AST 15 12/03/2016   NA 139 12/03/2016   K 4.6 12/03/2016   CL 100 12/03/2016   CREATININE 0.60 12/03/2016   BUN 7 12/03/2016   CO2 32 12/03/2016   TSH 1.59 12/03/2016   INR 1.0 ratio 11/07/2009    No results found.  Assessment & Plan:   There are no diagnoses linked to this encounter. I am having Ms. Whistler maintain her phenytoin, Docusate Calcium (STOOL SOFTENER PO), Cholecalciferol, sodium chloride, cyanocobalamin, promethazine, Insulin Syringe-Needle U-100, magic mouthwash, escitalopram, verapamil,  furosemide, FLUTTER, albuterol, clonazePAM, levothyroxine, traZODone, albuterol, budesonide, atorvastatin, cyclobenzaprine, sodium chloride HYPERTONIC, promethazine-codeine, acetaminophen-codeine, and nitroGLYCERIN.  No orders of the defined types were placed in this encounter.    Follow-up: No Follow-up on file.  Walker Kehr, MD

## 2016-12-17 NOTE — Assessment & Plan Note (Signed)
Dilantin

## 2016-12-17 NOTE — Assessment & Plan Note (Signed)
Clonazepam prn Trazodone

## 2016-12-17 NOTE — Assessment & Plan Note (Signed)
NTG prn Lipitor

## 2016-12-17 NOTE — Assessment & Plan Note (Signed)
On B12 inj 

## 2016-12-17 NOTE — Assessment & Plan Note (Signed)
Levothroid Rx

## 2016-12-17 NOTE — Patient Instructions (Addendum)
Well in Dec

## 2016-12-17 NOTE — Assessment & Plan Note (Signed)
Prom-cod syr Rx

## 2016-12-20 ENCOUNTER — Other Ambulatory Visit: Payer: Self-pay | Admitting: Cardiovascular Disease

## 2016-12-20 ENCOUNTER — Telehealth: Payer: Self-pay

## 2016-12-20 MED ORDER — VERAPAMIL HCL ER 240 MG PO TBCR: 240.0000 mg | EXTENDED_RELEASE_TABLET | Freq: Every day | ORAL | 3 refills | Status: DC

## 2016-12-20 NOTE — Telephone Encounter (Signed)
Rx request sent to pharmacy.  

## 2016-12-20 NOTE — Telephone Encounter (Signed)
[facility] sent rx for verapamil ER. erx sent.

## 2016-12-27 ENCOUNTER — Ambulatory Visit (INDEPENDENT_AMBULATORY_CARE_PROVIDER_SITE_OTHER): Payer: Medicare Other | Admitting: Psychology

## 2016-12-27 DIAGNOSIS — F331 Major depressive disorder, recurrent, moderate: Secondary | ICD-10-CM

## 2016-12-28 ENCOUNTER — Ambulatory Visit: Payer: Medicare Other | Admitting: Internal Medicine

## 2017-01-11 ENCOUNTER — Ambulatory Visit (INDEPENDENT_AMBULATORY_CARE_PROVIDER_SITE_OTHER): Payer: Medicare Other | Admitting: Psychology

## 2017-01-11 DIAGNOSIS — F331 Major depressive disorder, recurrent, moderate: Secondary | ICD-10-CM | POA: Diagnosis not present

## 2017-01-15 ENCOUNTER — Other Ambulatory Visit: Payer: Self-pay | Admitting: Pulmonary Disease

## 2017-01-24 ENCOUNTER — Ambulatory Visit (INDEPENDENT_AMBULATORY_CARE_PROVIDER_SITE_OTHER): Payer: Medicare Other | Admitting: Psychology

## 2017-01-24 DIAGNOSIS — F331 Major depressive disorder, recurrent, moderate: Secondary | ICD-10-CM | POA: Diagnosis not present

## 2017-01-25 ENCOUNTER — Telehealth: Payer: Self-pay | Admitting: Internal Medicine

## 2017-01-25 MED ORDER — PROMETHAZINE-CODEINE 6.25-10 MG/5ML PO SYRP: 5.0000 mL | ORAL_SOLUTION | Freq: Four times a day (QID) | ORAL | 1 refills | Status: DC | PRN

## 2017-01-25 NOTE — Telephone Encounter (Signed)
Check [place] registry last filled 12/27/2016...Veronica [name]

## 2017-01-25 NOTE — Telephone Encounter (Signed)
Ok - Rx emailed Thx 

## 2017-01-25 NOTE — Telephone Encounter (Signed)
Pt called and would like a refill of her promethazine-codeine (PHENERGAN WITH CODEINE) 6.25-10 MG/5ML syrup, she would like 1 refill  She states the pharmacy we can send it to the pharmacy  [facility] [place] [place] [address] her she may need to come and pick this up  Please advise  Please call back when ready

## 2017-01-25 NOTE — Telephone Encounter (Signed)
Pt is here in the office checking on this... Please advise.
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
## 2017-01-28 ENCOUNTER — Encounter: Payer: Self-pay | Admitting: Pulmonary Disease

## 2017-01-28 ENCOUNTER — Ambulatory Visit (INDEPENDENT_AMBULATORY_CARE_PROVIDER_SITE_OTHER): Payer: Medicare Other | Admitting: Pulmonary Disease

## 2017-01-28 ENCOUNTER — Ambulatory Visit (INDEPENDENT_AMBULATORY_CARE_PROVIDER_SITE_OTHER)
Admission: RE | Admit: 2017-01-28 | Discharge: 2017-01-28 | Disposition: A | Discharge status: Home / Self Care | Payer: Medicare Other | Source: Ambulatory Visit | Attending: Pulmonary Disease | Admitting: Pulmonary Disease

## 2017-01-28 VITALS — BP 132/70 | HR 73 | Ht 62.0 in | Wt 130.8 lb

## 2017-01-28 DIAGNOSIS — I25111 Atherosclerotic heart disease of native coronary artery with angina pectoris with documented spasm: Secondary | ICD-10-CM | POA: Diagnosis not present

## 2017-01-28 DIAGNOSIS — B370 Candidal stomatitis: Secondary | ICD-10-CM | POA: Diagnosis not present

## 2017-01-28 DIAGNOSIS — J45909 Unspecified asthma, uncomplicated: Secondary | ICD-10-CM

## 2017-01-28 DIAGNOSIS — J470 Bronchiectasis with acute lower respiratory infection: Secondary | ICD-10-CM

## 2017-01-28 DIAGNOSIS — K219 Gastro-esophageal reflux disease without esophagitis: Secondary | ICD-10-CM

## 2017-01-28 MED ORDER — LEVOFLOXACIN 500 MG PO TABS: 500.0000 mg | ORAL_TABLET | Freq: Every day | ORAL | 0 refills | Status: DC

## 2017-01-28 MED ORDER — NYSTATIN 100000 UNIT/ML MT SUSP: 5.0000 mL | Freq: Four times a day (QID) | OROMUCOSAL | 1 refills | Status: DC

## 2017-01-28 MED ORDER — METHYLPREDNISOLONE ACETATE 80 MG/ML IJ SUSP
80.0000 mg | Freq: Once | INTRAMUSCULAR | Status: AC
  Administered 2017-01-28: 80 mg via INTRAMUSCULAR

## 2017-01-28 NOTE — Patient Instructions (Addendum)
   Hold off on your Pulmicort in your nebulizer until your thrush is better.   Only use water to rinse your mouth. Don't use any mouthwash.  Call my office if you have any new breathing problems or questions before your next appointment.  TESTS ORDERED: 1. CXR PA/LAT TODAY

## 2017-01-31 ENCOUNTER — Other Ambulatory Visit: Payer: Self-pay | Admitting: Pulmonary Disease

## 2017-02-02 ENCOUNTER — Ambulatory Visit (INDEPENDENT_AMBULATORY_CARE_PROVIDER_SITE_OTHER): Payer: Medicare Other | Admitting: Adult Health

## 2017-02-02 ENCOUNTER — Encounter: Payer: Self-pay | Admitting: Adult Health

## 2017-02-02 DIAGNOSIS — J42 Unspecified chronic bronchitis: Secondary | ICD-10-CM | POA: Diagnosis not present

## 2017-02-02 DIAGNOSIS — J4531 Mild persistent asthma with (acute) exacerbation: Secondary | ICD-10-CM

## 2017-02-02 DIAGNOSIS — I25111 Atherosclerotic heart disease of native coronary artery with angina pectoris with documented spasm: Secondary | ICD-10-CM

## 2017-02-02 MED ORDER — LEVOFLOXACIN 500 MG PO TABS: 500.0000 mg | ORAL_TABLET | Freq: Every day | ORAL | 0 refills | Status: AC

## 2017-02-02 MED ORDER — PREDNISONE 10 MG PO TABS: ORAL_TABLET | ORAL | 0 refills | Status: DC

## 2017-02-02 NOTE — Progress Notes (Signed)
Note reviewed.  Sonia Baller Ashok Cordia, M.D. [facility] Pulmonary & Critical Care Pager:  [phone] After 7pm or if no response, call [phone] 12:16 PM 02/02/17

## 2017-02-02 NOTE — Assessment & Plan Note (Signed)
Slow to resolve flare . CXR was clear on 11/16.  Will extend abx x 5 days   Plan Patient Instructions  Extend Levaquin 500mg  daily for 5 days . Take with food.  Mucinex Twice daily  As needed  Cough/congestion  Prednisone taper over next week. Take with food .  Flutter valve Three times a day  .  Fluids and rest  Tylenol As needed   Follow up in 2 weeks with Gian Ybarra NP .  Follow up up Dr. Lake Bells in 2 months and .As needed    Previous CT chest in 06/2016 did not show ILD . Did shows some mild mucus impaction .

## 2017-02-02 NOTE — Progress Notes (Signed)
Reviewed, agree 

## 2017-02-02 NOTE — Patient Instructions (Addendum)
Extend Levaquin 500mg  daily for 5 days . Take with food.  Mucinex Twice daily  As needed  Cough/congestion  Prednisone taper over next week. Take with food .  Flutter valve Three times a day  .  Fluids and rest  Tylenol As needed   Follow up in 2 weeks with Parrett NP .  Follow up up Dr. Lake Bells in 2 months and .As needed    Previous CT chest in 06/2016 did not show ILD . Did shows some mild mucus impaction .

## 2017-02-02 NOTE — Progress Notes (Signed)
@Patient  ID: Veronica [name], female    DOB: Dec 05, 1950, 66 y.o.   MRN: [id number]  Chief Complaint  Patient presents with  . Follow-up    Asthma     Referring provider: Cassandria Anger, MD  HPI: 66 year old female never smoker followed for bronchiectasis asthma and GERD  TEST date PFT 07/09/16: FVC 1.78 L (58%) FEV1 1.48 L (64%) FEV1/FVC 0.83 FEF 25-75 1.71 L (85%) negative bronchodilator response TLC 4.08 L (81%) RV 104% ERV 35% DLCO corrected 66%  IMAGING HRCT CHEST W/O 06/28/16 (previously reviewed by me):  No reticular changes or intralobular septal thickening to suggest interstitial lung disease. No central bronchiectasis. Scattered areas of mucoid impaction involving the upper lobes as well as the inferior segment of the lingula and right middle lobe with some peripheral architectural distortion and volume loss suggestive of possible peripheral bronchiectasis. No pleural effusion or thickening. No pericardial effusion. No pathologic mediastinal adenopathy. Very mild air trapping on expiratory phase.  CXR PA/LAT 04/05/16 (previously reviewed by me):Hyperinflation suggested by deep sulci bilaterally. No focal mass or opacity. No pleural effusion. Heart normal in size &mediastinum normal in contour.  CT CHEST W/ 10/10/15 (previously reviewed by me):No pleural effusion or thickening. No pericardial effusion. No pathologic mediastinal adenopathy. Patchy opacification in medial and lateral segments of right middle lobe. Mild apical-predominant emphysematous changes.   LABS 05/31/16 Alpha-1 antitrypsin: MM (171)  IgG: 896 IgA: 296 IgM: 104 IgE: 45 RAST panel: D farinae 1.36 / D pternoyssinus 1.24 / Cockroach 0.4 SSA: <1.0 SSB: <1.0 CBC: 10.0/14.6/43.0/365 Eosinophil: 0  Trouble with 11/30/11 IgE: 23.0  02/02/2017 Follow up : Bronchiectasis and Asthma  Patient presents for a 5-day follow-up.  Patient was seen last visit for an acute exacerbation of bronchiectasis.   She was started on a 5-day course of Levaquin. And given a Depo-Medrol 80 mg injection. CXR was clear .  Since last visit she is feeling some better but still has lingering cough and congestion . Still has yellow and green mucus . Still has low grade fever.  No chest pain , orthopnea , edema, abd pain, hemoptysis .  She has good appetite ,no n/v.d.    Allergies  Allergen Reactions  . Cephalexin Shortness Of Breath and Itching  . Moxifloxacin Anaphylaxis, Itching, Swelling and Rash    Avelox  . Amantadine Hcl Other (See Comments)    Unknown  . Bee Venom Itching and Swelling    Localized  . Cymbalta [Duloxetine Hcl] Nausea Only    Dizzy  . Cyproheptadine Hcl Other (See Comments)    Unknown  . Doxycycline Hyclate Other (See Comments)    High blood pressure  . Effexor [Venlafaxine Hydrochloride] Other (See Comments)    elev BP (sky high), dizzy, shaking, ER visit  . Fluticasone-Salmeterol Itching  . Guaifenesin Other (See Comments)    Unknown  . Imipramine Hcl Other (See Comments)    Unknown   . Lactose Intolerance (Gi) Other (See Comments)    Per allergy testing  . Latex Itching    dermatitis  . Levothyroxine Sodium Other (See Comments)    Unknown  . Oxycodone-Acetaminophen Itching  . Phenytoin Other (See Comments)    Pt must DILANTIN "brand name" only   . Pirbuterol Acetate Other (See Comments)    Maxair, Unknown  . Remeron [Mirtazapine]     nightmares  . Salmeterol Xinafoate Other (See Comments)    Shaking, Serevent  . Viibryd [Vilazodone Hcl] Itching and Nausea Only  shaky  . Zolpidem Tartrate Other (See Comments)    REACTION: not effective  . Azithromycin Itching and Rash  . Ciprofloxacin Itching and Rash  . Erythromycin Base Itching and Rash  . Flagyl [Metronidazole Hcl] Itching and Rash  . Fluconazole Itching and Rash  . Fluoxetine Rash  . Lansoprazole Itching and Rash  . Metoclopramide Hcl Itching and Rash  . Montelukast Sodium Itching and Rash  .  Penicillins Itching and Rash    All cillins, Has patient had a PCN reaction causing immediate rash, facial/tongue/throat swelling, SOB or lightheadedness with hypotension: Yes Has patient had a PCN reaction causing severe rash involving mucus membranes or skin necrosis: No Has patient had a PCN reaction that required hospitalization No Has patient had a PCN reaction occurring within the last 10 years: Yes If all of the above answers are "NO", then may proceed with Cephalosporin use.   [name] Propulsid [Cisapride] Itching and Rash  . Reglan [Metoclopramide] Itching and Rash  . Sulfadiazine Itching and Rash  . Sulfamethoxazole-Trimethoprim Itching and Rash  . Telithromycin Itching and Rash  . Tetracycline Hcl Itching and Rash  . Topiramate Itching and Rash  . Valproic Acid Rash    Immunization History  Administered Date(s) Administered  . Pneumococcal Conjugate-13 11/13/2013  . Pneumococcal Polysaccharide-23 02/19/2004, 01/29/2009, 10/07/2015  . Td 02/11/2014    Past Medical History:  Diagnosis Date  . Asthma   . AVM (arteriovenous malformation)   . Bronchitis, chronic ([place])   . Colon polyp 01/04/91   hyperplastic  . COPD (chronic obstructive pulmonary disease) ([place])   . CVA (cerebral infarction)    following brain surgery  . Depression   . Diverticulosis of colon 02/17/06  . Endometriosis   . Gastric ulcer   . GERD (gastroesophageal reflux disease)   . Hyperlipidemia   . Hypertension   . LBP (low back pain)   . Migraine headache   . OA (osteoarthritis)   . PONV (postoperative nausea and vomiting)    slight nausea  . Seizure disorder ([place])    entered twice   . Seizure disorder ([place])   . Seizures ([place])   . Shortness of breath dyspnea   . Sjogren's disease ([place]) 2010   per Dr. Owens Shark, [place]  . Stroke [facility])    right side weakness  . Thyroid nodule   . Vitamin B 12 deficiency   . Vitamin D deficiency     Tobacco History: Social History   Tobacco Use  Smoking Status  Passive Smoke Exposure - Never Smoker  Smokeless Tobacco Never Used  Tobacco Comment   Father & mutiple other family members smoked.   Counseling given: Not Answered Comment: Father & mutiple other family members smoked.   Outpatient Encounter Medications as of 02/02/2017  Medication Sig  . acetaminophen-codeine (TYLENOL #3) 300-30 MG tablet Take 1 tablet by mouth every 6 (six) hours as needed for severe pain.  [name] albuterol (PROVENTIL) (2.5 MG/3ML) 0.083% nebulizer solution Take 3 mLs (2.5 mg total) by nebulization 2 (two) times daily.  [name] atorvastatin (LIPITOR) 40 MG tablet Take 1 tablet (40 mg total) by mouth daily.  . budesonide (PULMICORT) 0.25 MG/2ML nebulizer solution Take 2 mLs (0.25 mg total) by nebulization 2 (two) times daily.  . Cholecalciferol 1000 units tablet Take 1,000 Units by mouth daily.  . clonazePAM (KLONOPIN) 1 MG tablet 2 po qhs and 1/2 tab in addition in daytime prn  . cyanocobalamin (,VITAMIN B-12,) 1000 MCG/ML injection Inject 1 mL (1,000 mcg total)  into the muscle every 14 (fourteen) days.  . cyclobenzaprine (FLEXERIL) 10 MG tablet Take 1 tablet (10 mg total) by mouth 3 (three) times daily as needed for muscle spasms.  Mariane Baumgarten Calcium (STOOL SOFTENER PO) Take 100 mg by mouth as needed (for constipation).   [name] escitalopram (LEXAPRO) 20 MG tablet Take 1 tablet (20 mg total) by mouth daily.  . furosemide (LASIX) 20 MG tablet Take 1 tablet (20 mg total) by mouth daily as needed.  . Insulin Syringe-Needle U-100 (B-D INSULIN SYRINGE 1CC/26G) 26G X 1/2" 1 ML MISC Inject 1 each into the skin as directed.  [name] levofloxacin (LEVAQUIN) 500 MG tablet Take 1 tablet (500 mg total) by mouth daily for 5 days.  [name] levothyroxine (SYNTHROID, LEVOTHROID) 88 MCG tablet Take 1 tablet (88 mcg total) by mouth daily before breakfast.  . magic mouthwash SOLN Take 5 mLs by mouth 4 (four) times daily. Swish, hold and spit out---Do  Not swallow.  100 ml of dexamethasone 0.5 mg per 5 ml  elixir 60 ml nystatin 100,000 Unit 100 ml of diphenhydramine 12.5 mg per 5 ml elixir (Patient not taking: Reported on 02/02/2017)  . nitroGLYCERIN (NITROSTAT) 0.4 MG SL tablet PLACE 1 TABLET UNDER THE TONGUE EVERY 5 MINUTES AS NEEDED FOR CHEST PAIN  . nitroGLYCERIN (NITROSTAT) 0.4 MG SL tablet PLACE 1 TABLET UNDER THE TONGUE EVERY 5 MINUTES AS NEEDED FOR CHEST PAIN  . nystatin (MYCOSTATIN) 100000 UNIT/ML suspension Take 5 mLs (500,000 Units total) 4 (four) times daily by mouth.  . phenytoin (DILANTIN) 100 MG ER capsule 200 mg every morning and 100 mg every evening, DILANTIN "brand name" only  . predniSONE (DELTASONE) 10 MG tablet 4 tabs for 2 days, then 3 tabs for 2 days, 2 tabs for 2 days, then 1 tab for 2 days, then stop  . PROAIR HFA 108 (90 Base) MCG/ACT inhaler INHALE 2 PUFFS INTO THE LUNGS EVERY 4 HOURS AS NEEDED FOR WHEEZING OR SHORTNESS OF BREATH  . promethazine (PHENERGAN) 25 MG tablet Take 0.5 tablets (12.5 mg total) by mouth every 6 (six) hours as needed.  . promethazine-codeine (PHENERGAN WITH CODEINE) 6.25-10 MG/5ML syrup Take 5 mLs every 6 (six) hours as needed by mouth for cough.  [name] Respiratory Therapy Supplies (FLUTTER) DEVI Use after nebulization treatments  . sodium chloride (MURO 128) 5 % ophthalmic solution Place 1 drop into both eyes as needed for irritation.   . sodium chloride HYPERTONIC 3 % nebulizer solution USE 1 VIAL VIA NEBULIZER TWICE DAILY  . traZODone (DESYREL) 50 MG tablet Take 4 tablets (200 mg total) by mouth at bedtime.  . verapamil (CALAN-SR) 240 MG CR tablet Take 1 tablet (240 mg total) by mouth at bedtime.  . [DISCONTINUED] levofloxacin (LEVAQUIN) 500 MG tablet Take 1 tablet (500 mg total) daily by mouth. (Patient not taking: Reported on 02/02/2017)   No facility-administered encounter medications on file as of 02/02/2017.      Review of Systems  Constitutional:   No  weight loss, night sweats,   +Fevers, , fatigue, or  lassitude.  HEENT:   No  headaches,  Difficulty swallowing,  Tooth/dental problems, or  Sore throat,                No sneezing, itching, ear ache, + nasal congestion, post nasal drip,   CV:  No chest pain,  Orthopnea, PND, swelling in lower extremities, anasarca, dizziness, palpitations, syncope.   GI  No heartburn, indigestion, abdominal pain, nausea, vomiting, diarrhea, change in  bowel habits, loss of appetite, bloody stools.   Resp:   No chest wall deformity  Skin: no rash or lesions.  GU: no dysuria, change in color of urine, no urgency or frequency.  No flank pain, no hematuria   MS:  No joint pain or swelling.  No decreased range of motion.  No back pain.    Physical Exam  BP 120/66 (BP Location: Left Arm, Cuff Size: Normal)   Pulse 78   Ht 5\' 2"  (1.575 m)   Wt 130 lb 12.8 oz (59.3 kg)   SpO2 100%   BMI 23.92 kg/m    GEN: A/Ox3; pleasant , NAD, elderly    HEENT:  [place]/AT,  EACs-clear, TMs-wnl, NOSE-clear, THROAT-clear, no lesions, no postnasal drip or exudate noted.   NECK:  Supple w/ fair ROM; no JVD; normal carotid impulses w/o bruits; no thyromegaly or nodules palpated; no lymphadenopathy.    RESP  Few trace rhonchi and exp wheezes , speaks in full sentences,   no accessory muscle use, no dullness to percussion  CARD:  RRR, no m/r/g,  No peripheral edema, pulses intact, no cyanosis or clubbing.  GI:   Soft & nt; nml bowel sounds; no organomegaly or masses detected.   Musco: Warm bil, no deformities or joint swelling noted.   Neuro: alert, no focal deficits noted.    Skin: Warm, no lesions or rashes    Lab Results:  CBC  Imaging: Dg Chest 2 View  Result Date: 01/28/2017 CLINICAL DATA:  Cough and congestion . EXAM: CHEST  2 VIEW COMPARISON:  CT 06/28/2016.  Chest x-ray 04/05/2016 . FINDINGS: Mediastinum hilar structures are normal. Lungs are clear. No pleural effusion or pneumothorax. Heart size normal. Degenerative changes thoracic spine. Surgical clips left upper abdomen.  IMPRESSION: No acute cardiopulmonary disease. Electronically Signed   By: [place]   On: 01/28/2017 15:19     Assessment & Plan:   Chronic bronchitis ([place]) Slow to resolve flare . CXR was clear on 11/16.  Will extend abx x 5 days   Plan Patient Instructions  Extend Levaquin 500mg  daily for 5 days . Take with food.  Mucinex Twice daily  As needed  Cough/congestion  Prednisone taper over next week. Take with food .  Flutter valve Three times a day  .  Fluids and rest  Tylenol As needed   Follow up in 2 weeks with Jamale Spangler NP .  Follow up up Dr. Lake Bells in 2 months and .As needed    Previous CT chest in 06/2016 did not show ILD . Did shows some mild mucus impaction .     Asthma Exacerbation with bronchitis  Steroid taper   Plan  Patient Instructions  Extend Levaquin 500mg  daily for 5 days . Take with food.  Mucinex Twice daily  As needed  Cough/congestion  Prednisone taper over next week. Take with food .  Flutter valve Three times a day  .  Fluids and rest  Tylenol As needed   Follow up in 2 weeks with Willie Loy NP .  Follow up up Dr. Lake Bells in 2 months and .As needed    Previous CT chest in 06/2016 did not show ILD . Did shows some mild mucus impaction .        Rexene Edison, NP 02/02/2017

## 2017-02-02 NOTE — Assessment & Plan Note (Signed)
Exacerbation with bronchitis  Steroid taper   Plan  Patient Instructions  Extend Levaquin 500mg  daily for 5 days . Take with food.  Mucinex Twice daily  As needed  Cough/congestion  Prednisone taper over next week. Take with food .  Flutter valve Three times a day  .  Fluids and rest  Tylenol As needed   Follow up in 2 weeks with [place] NP .  Follow up up Dr. Lake Bells in 2 months and .As needed    Previous CT chest in 06/2016 did not show ILD . Did shows some mild mucus impaction .

## 2017-02-07 ENCOUNTER — Ambulatory Visit (INDEPENDENT_AMBULATORY_CARE_PROVIDER_SITE_OTHER): Payer: Medicare Other | Admitting: Psychology

## 2017-02-07 DIAGNOSIS — F331 Major depressive disorder, recurrent, moderate: Secondary | ICD-10-CM

## 2017-02-10 ENCOUNTER — Telehealth: Payer: Self-pay | Admitting: *Deleted

## 2017-02-10 NOTE — Telephone Encounter (Signed)
Pls w/in w/any avail provider tomorrow I'm full - sorry! Thx

## 2017-02-10 NOTE — Telephone Encounter (Signed)
Called patient to notify that she would not be able to be seen tomorrow for AWV due to it has not been a complete year since her last visit. Patient asked if she could be seen any way and shared that she really needed to be seen tomorrow. Patient shared that she felt very depressed and that she did not feel that the Lexapro was effectively treating her depression. Patient explained that she saw her mental therapist Arville Lime Monday who does not prescribe medications. Provider Bauert asked her to make an appointment with PCP quickly to have her medication changed. The patient knew she had an appointment 11/30 for an AWV and thought she would asked about new antidepressant medication at that time. Patient denies having any plans to harm herself or of having any suicidal ideations but did state that she did not want to wait any longer to have her medications changed and that she has been much more depressed lately. Patient requested to be seen tomorrow if at all possible as she did not want to wait over the weekend before this issue was addressed. Nurse spoke to department manager who was able to assist in scheduling an appointment with PCP. It was explained to the patient that this would be an overbooked appointment meaning that her PCP was fitting her into a extremely busy schedule and that she may have to wait to be seen . The patient verbalized understanding and was greatly appreciative. She confirmed with the nurse that she knows her appointment is for 02/11/17 @ 1545.

## 2017-02-11 ENCOUNTER — Ambulatory Visit: Payer: Self-pay

## 2017-02-11 ENCOUNTER — Ambulatory Visit (INDEPENDENT_AMBULATORY_CARE_PROVIDER_SITE_OTHER): Payer: Medicare Other | Admitting: Internal Medicine

## 2017-02-11 ENCOUNTER — Encounter: Payer: Self-pay | Admitting: Internal Medicine

## 2017-02-11 VITALS — BP 146/84 | HR 74 | Temp 97.7°F | Resp 20 | Ht 62.0 in | Wt 129.0 lb

## 2017-02-11 DIAGNOSIS — M545 Low back pain, unspecified: Secondary | ICD-10-CM

## 2017-02-11 DIAGNOSIS — L509 Urticaria, unspecified: Secondary | ICD-10-CM | POA: Diagnosis not present

## 2017-02-11 DIAGNOSIS — I25111 Atherosclerotic heart disease of native coronary artery with angina pectoris with documented spasm: Secondary | ICD-10-CM | POA: Diagnosis not present

## 2017-02-11 DIAGNOSIS — G8929 Other chronic pain: Secondary | ICD-10-CM | POA: Diagnosis not present

## 2017-02-11 DIAGNOSIS — J42 Unspecified chronic bronchitis: Secondary | ICD-10-CM

## 2017-02-11 DIAGNOSIS — F332 Major depressive disorder, recurrent severe without psychotic features: Secondary | ICD-10-CM

## 2017-02-11 DIAGNOSIS — E875 Hyperkalemia: Secondary | ICD-10-CM | POA: Diagnosis not present

## 2017-02-11 DIAGNOSIS — E539 Vitamin B deficiency, unspecified: Secondary | ICD-10-CM

## 2017-02-11 DIAGNOSIS — J449 Chronic obstructive pulmonary disease, unspecified: Secondary | ICD-10-CM | POA: Diagnosis not present

## 2017-02-11 DIAGNOSIS — E559 Vitamin D deficiency, unspecified: Secondary | ICD-10-CM | POA: Diagnosis not present

## 2017-02-11 MED ORDER — ESCITALOPRAM OXALATE 20 MG PO TABS: 20.0000 mg | ORAL_TABLET | Freq: Every day | ORAL | 3 refills | Status: DC

## 2017-02-11 MED ORDER — CARIPRAZINE HCL 1.5 MG PO CAPS: 1.5000 mg | ORAL_CAPSULE | Freq: Every day | ORAL | 2 refills | Status: DC

## 2017-02-11 MED ORDER — CYANOCOBALAMIN 1000 MCG/ML IJ SOLN
1000.0000 ug | Freq: Once | INTRAMUSCULAR | Status: AC
  Administered 2017-02-11: 1000 ug via INTRAMUSCULAR

## 2017-02-11 MED ORDER — ACETAMINOPHEN-CODEINE #3 300-30 MG PO TABS: 1.0000 | ORAL_TABLET | ORAL | 3 refills | Status: DC | PRN

## 2017-02-11 MED ORDER — PROMETHAZINE-CODEINE 6.25-10 MG/5ML PO SYRP: 5.0000 mL | ORAL_SOLUTION | Freq: Four times a day (QID) | ORAL | 1 refills | Status: DC | PRN

## 2017-02-11 NOTE — Assessment & Plan Note (Signed)
Tylenol #3 prn  Potential benefits of a long term opioids use as well as potential risks (i.e. addiction risk, apnea etc) and complications (i.e. Somnolence, constipation and others) were explained to the patient and were aknowledged.

## 2017-02-11 NOTE — Assessment & Plan Note (Signed)
Pulmicort, Combivent Prom-cod syr prn

## 2017-02-11 NOTE — Assessment & Plan Note (Addendum)
Worse. Pt feels that Lexapro is not working. 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 Her therapist is Coralyn Mark.  Cont Lexapro. We added Vraylar low dose Psych ref Dr Reece Levy

## 2017-02-11 NOTE — Progress Notes (Signed)
Subjective:  Patient ID: Veronica [name], female    DOB: 02/18/1951  Age: 66 y.o. MRN: [id number]  CC: Depression (lexapro not working)   HPI Veronica [name] presents for depression: worse. Pt feels that Lexapro is not working. 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 Her therapist is Coralyn Mark.  Outpatient Medications Prior to Visit  Medication Sig Dispense Refill  . acetaminophen-codeine (TYLENOL #3) 300-30 MG tablet Take 1 tablet by mouth every 6 (six) hours as needed for severe pain. 60 tablet 3  . albuterol (PROVENTIL) (2.5 MG/3ML) 0.083% nebulizer solution Take 3 mLs (2.5 mg total) by nebulization 2 (two) times daily. 60 mL 3  . aspirin 81 MG chewable tablet Chew by mouth daily.    . budesonide (PULMICORT) 0.25 MG/2ML nebulizer solution Take 2 mLs (0.25 mg total) by nebulization 2 (two) times daily. 120 mL 3  . Cholecalciferol 1000 units tablet Take 1,000 Units by mouth daily.    . clonazePAM (KLONOPIN) 1 MG tablet 2 po qhs and 1/2 tab in addition in daytime prn 225 tablet 1  . cyanocobalamin (,VITAMIN B-12,) 1000 MCG/ML injection Inject 1 mL (1,000 mcg total) into the muscle every 14 (fourteen) days. 6 mL 11  . cyclobenzaprine (FLEXERIL) 10 MG tablet Take 1 tablet (10 mg total) by mouth 3 (three) times daily as needed for muscle spasms. 90 tablet 0  . Docusate Calcium (STOOL SOFTENER PO) Take 100 mg by mouth as needed (for constipation).     [name] escitalopram (LEXAPRO) 20 MG tablet Take 1 tablet (20 mg total) by mouth daily. 90 tablet 3  . furosemide (LASIX) 20 MG tablet Take 1 tablet (20 mg total) by mouth daily as needed. 90 tablet 3  . Insulin Syringe-Needle U-100 (B-D INSULIN SYRINGE 1CC/26G) 26G X 1/2" 1 ML MISC Inject 1 each into the skin as directed. 100 each 5  . levothyroxine (SYNTHROID, LEVOTHROID) 88 MCG tablet Take 1 tablet (88 mcg total) by mouth daily before breakfast. 90 tablet 3  . magic mouthwash SOLN Take 5 mLs by mouth 4 (four) times daily. Swish, hold  and spit out---Do  Not swallow.  100 ml of dexamethasone 0.5 mg per 5 ml elixir 60 ml nystatin 100,000 Unit 100 ml of diphenhydramine 12.5 mg per 5 ml elixir 260 mL 3  . nitroGLYCERIN (NITROSTAT) 0.4 MG SL tablet PLACE 1 TABLET UNDER THE TONGUE EVERY 5 MINUTES AS NEEDED FOR CHEST PAIN 275 tablet 3  . phenytoin (DILANTIN) 100 MG ER capsule 200 mg every morning and 100 mg every evening, DILANTIN "brand name" only 270 capsule 2  . PROAIR HFA 108 (90 Base) MCG/ACT inhaler INHALE 2 PUFFS INTO THE LUNGS EVERY 4 HOURS AS NEEDED FOR WHEEZING OR SHORTNESS OF BREATH 17 g 0  . promethazine (PHENERGAN) 25 MG tablet Take 0.5 tablets (12.5 mg total) by mouth every 6 (six) hours as needed. 60 tablet 1  . promethazine-codeine (PHENERGAN WITH CODEINE) 6.25-10 MG/5ML syrup Take 5 mLs every 6 (six) hours as needed by mouth for cough. 300 mL 1  . Respiratory Therapy Supplies (FLUTTER) DEVI Use after nebulization treatments 1 each 0  . sodium chloride (MURO 128) 5 % ophthalmic solution Place 1 drop into both eyes as needed for irritation.     . sodium chloride HYPERTONIC 3 % nebulizer solution USE 1 VIAL VIA NEBULIZER TWICE DAILY 240 mL 0  . traZODone (DESYREL) 50 MG tablet Take 4 tablets (200 mg total) by mouth at bedtime. 360 tablet  3  . verapamil (CALAN-SR) 240 MG CR tablet Take 1 tablet (240 mg total) by mouth at bedtime. 90 tablet 3  . atorvastatin (LIPITOR) 40 MG tablet Take 1 tablet (40 mg total) by mouth daily. 30 tablet 11  . nitroGLYCERIN (NITROSTAT) 0.4 MG SL tablet PLACE 1 TABLET UNDER THE TONGUE EVERY 5 MINUTES AS NEEDED FOR CHEST PAIN 275 tablet 1  . nystatin (MYCOSTATIN) 100000 UNIT/ML suspension Take 5 mLs (500,000 Units total) 4 (four) times daily by mouth. (Patient not taking: Reported on 02/11/2017) 60 mL 1  . predniSONE (DELTASONE) 10 MG tablet 4 tabs for 2 days, then 3 tabs for 2 days, 2 tabs for 2 days, then 1 tab for 2 days, then stop (Patient not taking: Reported on 02/11/2017) 20 tablet 0    No facility-administered medications prior to visit.     ROS Review of Systems  Constitutional: Negative for activity change, appetite change, chills, fatigue and unexpected weight change.  HENT: Negative for congestion, mouth sores and sinus pressure.   Eyes: Negative for visual disturbance.  Respiratory: Negative for cough and chest tightness.   Gastrointestinal: Negative for abdominal pain and nausea.  Genitourinary: Negative for difficulty urinating, frequency and vaginal pain.  Musculoskeletal: Negative for back pain and gait problem.  Skin: Negative for pallor and rash.  Neurological: Negative for dizziness, tremors, weakness, numbness and headaches.  Psychiatric/Behavioral: Positive for dysphoric mood. Negative for confusion, sleep disturbance and suicidal ideas. The patient is not nervous/anxious.     Objective:  BP (!) 146/84   Pulse 74   Temp 97.7 F (36.5 C)   Resp 20   Ht 5\' 2"  (1.575 m)   Wt 129 lb (58.5 kg)   SpO2 99%   BMI 23.59 kg/m   BP Readings from Last 3 Encounters:  02/11/17 (!) 146/84  02/02/17 120/66  01/28/17 132/70    Wt Readings from Last 3 Encounters:  02/11/17 129 lb (58.5 kg)  02/02/17 130 lb 12.8 oz (59.3 kg)  01/28/17 130 lb 12.8 oz (59.3 kg)    Physical Exam  Constitutional: She appears well-developed. No distress.  HENT:  Head: Normocephalic.  Right Ear: External ear normal.  Left Ear: External ear normal.  Nose: Nose normal.  Mouth/Throat: Oropharynx is clear and moist.  Eyes: Conjunctivae are normal. Pupils are equal, round, and reactive to light. Right eye exhibits no discharge. Left eye exhibits no discharge.  Neck: Normal range of motion. Neck supple. No JVD present. No tracheal deviation present. No thyromegaly present.  Cardiovascular: Normal rate, regular rhythm and normal heart sounds.  Pulmonary/Chest: No stridor. No respiratory distress. She has no wheezes.  Abdominal: Soft. Bowel sounds are normal. She exhibits no  distension and no mass. There is no tenderness. There is no rebound and no guarding.  Musculoskeletal: She exhibits no edema or tenderness.  Lymphadenopathy:    She has no cervical adenopathy.  Neurological: She displays normal reflexes. No cranial nerve deficit. She exhibits normal muscle tone. Coordination normal.  Skin: No rash noted. No erythema.  Psychiatric: Her behavior is normal. Judgment and thought content normal.  sad, tearful  Lab Results  Component Value Date   WBC 10.0 05/31/2016   HGB 14.6 05/31/2016   HCT 43.0 05/31/2016   PLT 365.0 05/31/2016   GLUCOSE 92 12/03/2016   CHOL 175 11/30/2016   TRIG 81 11/30/2016   HDL 63 11/30/2016   LDLDIRECT 165.0 09/26/2009   LDLCALC 96 11/30/2016   ALT 7 12/03/2016   AST 15  12/03/2016   NA 139 12/03/2016   K 4.6 12/03/2016   CL 100 12/03/2016   CREATININE 0.60 12/03/2016   BUN 7 12/03/2016   CO2 32 12/03/2016   TSH 1.59 12/03/2016   INR 1.0 ratio 11/07/2009    Dg Chest 2 View  Result Date: 01/28/2017 CLINICAL DATA:  Cough and congestion . EXAM: CHEST  2 VIEW COMPARISON:  CT 06/28/2016.  Chest x-ray 04/05/2016 . FINDINGS: Mediastinum hilar structures are normal. Lungs are clear. No pleural effusion or pneumothorax. Heart size normal. Degenerative changes thoracic spine. Surgical clips left upper abdomen. IMPRESSION: No acute cardiopulmonary disease. Electronically Signed   By: Marcello Moores  Register   On: 01/28/2017 15:19    Assessment & Plan:   There are no diagnoses linked to this encounter. I have discontinued Veronica [name]'s nystatin and predniSONE. I am also having her maintain her Docusate Calcium (STOOL SOFTENER PO), Cholecalciferol, sodium chloride, promethazine, magic mouthwash, furosemide, FLUTTER, albuterol, levothyroxine, budesonide, atorvastatin, cyclobenzaprine, acetaminophen-codeine, nitroGLYCERIN, clonazePAM, escitalopram, traZODone, cyanocobalamin, Insulin Syringe-Needle U-100, phenytoin, verapamil, sodium  chloride HYPERTONIC, promethazine-codeine, PROAIR HFA, and aspirin.  No orders of the defined types were placed in this encounter.    Follow-up: No Follow-up on file.  Walker Kehr, MD

## 2017-02-11 NOTE — Patient Instructions (Signed)
Cariprazine oral capsules What is this medicine? CARIPRAZINE (car i PRA zeen) is an antipsychotic. It is used to treat schizophrenia or bipolar disorder. Bipolar disorder is also known as manic-depression. This medicine may be used for other purposes; ask your health care provider or pharmacist if you have questions. COMMON BRAND NAME(S): VRAYLAR What should I tell my health care provider before I take this medicine? They need to know if you have any of these conditions: -dementia -diabetes -difficulty swallowing -heart disease -history of breast cancer -kidney disease -liver disease -low blood counts, like low white cell, platelet, or red cell counts -low blood pressure -Parkinson's disease -seizures -suicidal thoughts, plans, or attempt; a previous suicide attempt by you or a family member -an unusual or allergic reaction to cariprazine, other medicines, foods, dyes, or preservatives -pregnant or trying to get pregnant -breast-feeding How should I use this medicine? Take this medicine by mouth with a glass of water. Follow the directions on the prescription label. You may take it with or without food. Take your medicine at regular intervals. Do not take it more often than directed. Do not stop taking except on your doctor's advice. Talk to your pediatrician regarding the use of this medicine in children. Special care may be needed. Overdosage: If you think you have taken too much of this medicine contact a poison control center or emergency room at once. NOTE: This medicine is only for you. Do not share this medicine with others. What if I miss a dose? If you miss a dose, take it as soon as you can. If it is almost time for your next dose, take only that dose. Do not take double or extra doses. What may interact with this medicine? Do not take this medicine with any of the following medications: -metoclopramide This medicine may also interact with the following  medications: -carbamazepine -certain medicines for depression, anxiety, or psychotic disturbances -certain medicines for sleep -itraconazole -ketoconazole -medicines for blood pressure -rifampin -St John's wort This list may not describe all possible interactions. Give your health care provider a list of all the medicines, herbs, non-prescription drugs, or dietary supplements you use. Also tell them if you smoke, drink alcohol, or use illegal drugs. Some items may interact with your medicine. What should I watch for while using this medicine? Visit your doctor or health care professional for regular checks on your progress. It may be several weeks before you see the full effects of this medicine. Notify your doctor or health care professional if your symptoms get worse, if you have new symptoms, if you are having an unusual effect from this medicine, or if you feel out of control, very discouraged or think you might harm yourself or others. Do not suddenly stop taking this medicine. You may need to gradually reduce the dose. Ask your doctor or health care professional for advice. You may get dizzy or drowsy. Do not drive, use machinery, or do anything that needs mental alertness until you know how this medicine affects you. Do not stand or sit up quickly, especially if you are an older patient. This reduces the risk of dizzy or fainting spells. Alcohol can increase dizziness and drowsiness. Avoid alcoholic drinks. This medicine may cause dry eyes and blurred vision. If you wear contact lenses you may feel some discomfort. Lubricating drops may help. See your eye doctor if the problem does not go away or is severe. This medicine can reduce the response of your body to heat or cold. Dress   warm in cold weather and stay hydrated in hot weather. If possible, avoid extreme temperatures like saunas, hot tubs, very hot or cold showers, or activities that can cause dehydration such as vigorous exercise. Women  should inform their doctor if they wish to become pregnant or think they might be pregnant. The effects of this medicine on an unborn child are not known. A registry is available to monitor pregnancy outcomes in pregnant women exposed to this medicine or similar medicines. Talk to your health care professional or pharmacist for more information. What side effects may I notice from receiving this medicine? Side effects that you should report to your doctor or health care professional as soon as possible: -allergic reactions like skin rash, itching or hives, swelling of the face, lips, or tongue -changes in emotions or moods -confusion -difficulty swallowing -feeling faint or lightheaded, falls -fever or chills, sore throat -inability to control muscle movements in the face, mouth, hands, arms, or legs -increased hunger or thirst -increased urination -missed or irregular menstrual periods -problems with balance, talking, walking -redness, blistering, peeling or loosening of the skin, including inside the mouth -seizures -stiff muscles -suicidal thoughts or other mood changes -unusually weak or tired Side effects that usually do not require medical attention (report to your doctor or health care professional if they continue or are bothersome): -constipation -dizziness -drowsiness -nausea, vomiting -restlessness -upset stomach -weight gain This list may not describe all possible side effects. Call your doctor for medical advice about side effects. You may report side effects to FDA at 1-800-FDA-1088. Where should I keep my medicine? Keep out of the reach of children. Store at room temperature between 15 and 30 degrees C (59 and 86 degrees F). Protect from light. Throw away any unused medicine after the expiration date. NOTE: This sheet is a summary. It may not cover all possible information. If you have questions about this medicine, talk to your doctor, pharmacist, or health care  provider.  2018 Elsevier/Gold Standard (2016-01-30 15:40:17)  

## 2017-02-14 ENCOUNTER — Other Ambulatory Visit: Payer: Self-pay | Admitting: Pulmonary Disease

## 2017-02-16 ENCOUNTER — Other Ambulatory Visit: Payer: Self-pay | Admitting: Cardiovascular Disease
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
## 2017-02-18 ENCOUNTER — Encounter: Payer: Self-pay | Admitting: Adult Health

## 2017-02-18 ENCOUNTER — Ambulatory Visit (INDEPENDENT_AMBULATORY_CARE_PROVIDER_SITE_OTHER): Payer: Medicare Other | Admitting: Adult Health

## 2017-02-18 ENCOUNTER — Ambulatory Visit (INDEPENDENT_AMBULATORY_CARE_PROVIDER_SITE_OTHER): Payer: Medicare Other | Admitting: *Deleted

## 2017-02-18 VITALS — BP 134/72 | HR 58 | Resp 20 | Ht 62.0 in | Wt 127.0 lb

## 2017-02-18 DIAGNOSIS — Z0000 Encounter for general adult medical examination without abnormal findings: Secondary | ICD-10-CM | POA: Diagnosis not present

## 2017-02-18 DIAGNOSIS — J42 Unspecified chronic bronchitis: Secondary | ICD-10-CM

## 2017-02-18 DIAGNOSIS — I25111 Atherosclerotic heart disease of native coronary artery with angina pectoris with documented spasm: Secondary | ICD-10-CM | POA: Diagnosis not present

## 2017-02-18 DIAGNOSIS — J4531 Mild persistent asthma with (acute) exacerbation: Secondary | ICD-10-CM

## 2017-02-18 NOTE — Progress Notes (Signed)
Reviewed, agree 

## 2017-02-18 NOTE — Assessment & Plan Note (Signed)
Resolving flare   Plan  Patient Instructions  Mucinex Twice daily  As needed  Cough/congestion  Flutter valve Three times a day  .  Continue Pulmicort Neb and Albuterol Neb Twice daily  .  Follow up up Dr. [place] Bells in January as planned  and .As needed   Please contact office for sooner follow up if symptoms do not improve or worsen or seek emergency care

## 2017-02-18 NOTE — Assessment & Plan Note (Signed)
Resolving exacerbation   Plan  Patient Instructions  Mucinex Twice daily  As needed  Cough/congestion  Flutter valve Three times a day  .  Continue Pulmicort Neb and Albuterol Neb Twice daily  .  Follow up up Dr. Lake Bells in January as planned  and .As needed   Please contact office for sooner follow up if symptoms do not improve or worsen or seek emergency care

## 2017-02-18 NOTE — Progress Notes (Signed)
@Patient  ID: Veronica [name], female    DOB: [birth year], 66 y.o.   MRN: [id number]  Chief Complaint  Patient presents with  . Follow-up    Bronchitis     Referring provider: Cassandria Anger, MD  HPI: 66 year old female never smoker followed for bronchiectasis asthma and GERD  TEST date PFT 07/09/16: FVC 1.78 L (58%) FEV1 1.48 L (64%) FEV1/FVC 0.83 FEF 25-75 1.71 L (85%) negative bronchodilator response TLC 4.08 L (81%) RV 104% ERV 35% DLCO corrected 66%  IMAGING HRCT CHEST W/O 06/28/16 (previously reviewed by me): No reticular changes or intralobular septal thickening to suggest interstitial lung disease. No central bronchiectasis. Scattered areas of mucoid impaction involving the upper lobes as well as the inferior segment of the lingula and right middle lobe with some peripheral architectural distortion and volume loss suggestive of possible peripheral bronchiectasis. No pleural effusion or thickening. No pericardial effusion. No pathologic mediastinal adenopathy. Very mild air trapping on expiratory phase.  CXR PA/LAT 04/05/16 (previously reviewed by me):Hyperinflation suggested by deep sulci bilaterally. No focal mass or opacity. No pleural effusion. Heart normal in size &mediastinum normal in contour.  CT CHEST W/ 10/10/15 (previously reviewed by me):No pleural effusion or thickening. No pericardial effusion. No pathologic mediastinal adenopathy. Patchy opacification in medial and lateral segments of right middle lobe. Mild apical-predominant emphysematous changes.   LABS 05/31/16 Alpha-1 antitrypsin: MM (171)  IgG: 896 IgA: 296 IgM: 104 IgE: 45 RAST panel: D farinae 1.36 / D pternoyssinus 1.24 / Cockroach 0.4 SSA: <1.0 SSB: <1.0 CBC: 10.0/14.6/43.0/365 Eosinophil: 0  Trouble with 11/30/11 IgE: 23.0  02/18/2017 Follow up : Bronchiectasis  Pt returns for 2 week follow up . She was seen last ov with slow to resolve asthmatic bronchitic exacerbation . Levaquin  was extended for total of 10 days and prednisone taper.  She returns today , feeling better. She has decreased cough , congestion and wheezing .  Denies fever, chest pain , orthopnea , or edema . Appetite is good.  She is using flutter valve Three times a day  . Remains on Pulmicort and Albuterol neb Twice daily.       Allergies  Allergen Reactions  . Avelox [Moxifloxacin Hcl In Nacl] Anaphylaxis, Swelling and Rash  . Cephalexin Shortness Of Breath and Itching  . Moxifloxacin Anaphylaxis, Itching, Swelling and Rash    Avelox  . Amantadine Hcl Other (See Comments)    Unknown  . Bee Venom Itching and Swelling    Localized  . Cymbalta [Duloxetine Hcl] Nausea Only    Dizzy  . Cyproheptadine Hcl Other (See Comments)    Unknown  . Doxycycline Hyclate Other (See Comments)    High blood pressure  . Effexor [Venlafaxine Hydrochloride] Other (See Comments)    elev BP (sky high), dizzy, shaking, ER visit  . Effexor [Venlafaxine]     Restless   . Fluticasone-Salmeterol Itching  . Guaifenesin Other (See Comments)    Unknown  . Imipramine Hcl Other (See Comments)    Unknown   . Lactose Intolerance (Gi) Other (See Comments)    Per allergy testing  . Latex Itching    dermatitis  . Oxycodone-Acetaminophen Itching  . Phenytoin Other (See Comments)    Pt must DILANTIN "brand name" only   . Pirbuterol Acetate Other (See Comments)    Maxair, Unknown  . Remeron [Mirtazapine]     nightmares  . Salmeterol Xinafoate Other (See Comments)    Shaking, Serevent  . Viibryd [Vilazodone Hcl] Itching and  Nausea Only    shaky  . Zolpidem Tartrate Other (See Comments)    REACTION: not effective  . Azithromycin Itching and Rash  . Ciprofloxacin Itching and Rash  . Erythromycin Base Itching and Rash  . Flagyl [Metronidazole Hcl] Itching and Rash  . Fluconazole Itching and Rash  . Fluoxetine Rash  . Lansoprazole Itching and Rash  . Metoclopramide Hcl Itching and Rash  . Montelukast Sodium  Itching and Rash  . Penicillins Itching and Rash    All cillins, Has patient had a PCN reaction causing immediate rash, facial/tongue/throat swelling, SOB or lightheadedness with hypotension: Yes Has patient had a PCN reaction causing severe rash involving mucus membranes or skin necrosis: No Has patient had a PCN reaction that required hospitalization No Has patient had a PCN reaction occurring within the last 10 years: Yes If all of the above answers are "NO", then may proceed with Cephalosporin use.   [name] Propulsid [Cisapride] Itching and Rash  . Reglan [Metoclopramide] Itching and Rash  . Sulfadiazine Itching and Rash  . Sulfamethoxazole-Trimethoprim Itching and Rash  . Telithromycin Itching and Rash  . Tetracycline Hcl Itching and Rash  . Topiramate Itching and Rash  . Valproic Acid Rash    Immunization History  Administered Date(s) Administered  . Pneumococcal Conjugate-13 11/13/2013  . Pneumococcal Polysaccharide-23 02/19/2004, 01/29/2009, 10/07/2015  . Td 02/11/2014    Past Medical History:  Diagnosis Date  . Asthma   . AVM (arteriovenous malformation)   . Bronchitis, chronic ([place])   . Colon polyp 01/04/91   hyperplastic  . COPD (chronic obstructive pulmonary disease) ([place])   . CVA (cerebral infarction)    following brain surgery  . Depression   . Diverticulosis of colon 02/17/06  . Endometriosis   . Gastric ulcer   . GERD (gastroesophageal reflux disease)   . Hyperlipidemia   . Hypertension   . LBP (low back pain)   . Migraine headache   . OA (osteoarthritis)   . PONV (postoperative nausea and vomiting)    slight nausea  . Seizure disorder ([place])    entered twice   . Seizure disorder ([place])   . Seizures ([place])   . Shortness of breath dyspnea   . Sjogren's disease ([place]) 2010   per Dr. Owens Shark, [place]  . Stroke [facility])    right side weakness  . Thyroid nodule   . Vitamin B 12 deficiency   . Vitamin D deficiency     Tobacco History: Social History   Tobacco  Use  Smoking Status Passive Smoke Exposure - Never Smoker  Smokeless Tobacco Never Used  Tobacco Comment   Father & mutiple other family members smoked.   Counseling given: Not Answered Comment: Father & mutiple other family members smoked.   Outpatient Encounter Medications as of 02/18/2017  Medication Sig  . acetaminophen-codeine (TYLENOL #3) 300-30 MG tablet Take 1 tablet by mouth every 4 (four) hours as needed.  [name] albuterol (PROVENTIL) (2.5 MG/3ML) 0.083% nebulizer solution Take 3 mLs (2.5 mg total) by nebulization 2 (two) times daily.  [name] aspirin 81 MG chewable tablet Chew by mouth daily.  . budesonide (PULMICORT) 0.25 MG/2ML nebulizer solution Take 2 mLs (0.25 mg total) by nebulization 2 (two) times daily.  . Cariprazine HCl (VRAYLAR) 1.5 MG CAPS Take 1 capsule (1.5 mg total) by mouth daily.  . Cholecalciferol 1000 units tablet Take 1,000 Units by mouth daily.  . clonazePAM (KLONOPIN) 1 MG tablet 2 po qhs and 1/2 tab in addition in daytime prn  .  cyanocobalamin (,VITAMIN B-12,) 1000 MCG/ML injection Inject 1 mL (1,000 mcg total) into the muscle every 14 (fourteen) days.  . cyclobenzaprine (FLEXERIL) 10 MG tablet Take 1 tablet (10 mg total) by mouth 3 (three) times daily as needed for muscle spasms.  Mariane Baumgarten Calcium (STOOL SOFTENER PO) Take 100 mg by mouth as needed (for constipation).   [name] escitalopram (LEXAPRO) 20 MG tablet Take 1 tablet (20 mg total) by mouth daily.  . furosemide (LASIX) 20 MG tablet Take 1 tablet (20 mg total) by mouth daily as needed.  . Insulin Syringe-Needle U-100 (B-D INSULIN SYRINGE 1CC/26G) 26G X 1/2" 1 ML MISC Inject 1 each into the skin as directed.  [name] levothyroxine (SYNTHROID, LEVOTHROID) 88 MCG tablet Take 1 tablet (88 mcg total) by mouth daily before breakfast.  . magic mouthwash SOLN Take 5 mLs by mouth 4 (four) times daily. Swish, hold and spit out---Do  Not swallow.  100 ml of dexamethasone 0.5 mg per 5 ml elixir 60 ml nystatin 100,000 Unit 100 ml  of diphenhydramine 12.5 mg per 5 ml elixir  . nitroGLYCERIN (NITROSTAT) 0.4 MG SL tablet PLACE 1 TABLET UNDER THE TONGUE EVERY 5 MINUTES AS NEEDED FOR CHEST PAIN  . nitroGLYCERIN (NITROSTAT) 0.4 MG SL tablet PLACE 1 TABLET UNDER THE TONGUE EVERY 5 MINUTES AS NEEDED FOR CHEST PAIN  . phenytoin (DILANTIN) 100 MG ER capsule 200 mg every morning and 100 mg every evening, DILANTIN "brand name" only  . PROAIR HFA 108 (90 Base) MCG/ACT inhaler INHALE 2 PUFFS INTO THE LUNGS EVERY 4 HOURS AS NEEDED FOR WHEEZING OR SHORTNESS OF BREATH  . promethazine (PHENERGAN) 25 MG tablet Take 0.5 tablets (12.5 mg total) by mouth every 6 (six) hours as needed.  . promethazine-codeine (PHENERGAN WITH CODEINE) 6.25-10 MG/5ML syrup Take 5 mLs by mouth every 6 (six) hours as needed for cough.  [name] Respiratory Therapy Supplies (FLUTTER) DEVI Use after nebulization treatments  . sodium chloride (MURO 128) 5 % ophthalmic solution Place 1 drop into both eyes as needed for irritation.   . sodium chloride HYPERTONIC 3 % nebulizer solution USE 1 VIAL VIA NEBULIZER TWICE DAILY  . traZODone (DESYREL) 50 MG tablet Take 4 tablets (200 mg total) by mouth at bedtime.  . verapamil (CALAN-SR) 240 MG CR tablet Take 1 tablet (240 mg total) by mouth at bedtime.  [name] atorvastatin (LIPITOR) 40 MG tablet Take 1 tablet (40 mg total) by mouth daily.   No facility-administered encounter medications on file as of 02/18/2017.      Review of Systems  Constitutional:   No  weight loss, night sweats,  Fevers, chills, + fatigue, or  lassitude.  HEENT:   No headaches,  Difficulty swallowing,  Tooth/dental problems, or  Sore throat,                No sneezing, itching, ear ache, nasal congestion, post nasal drip,   CV:  No chest pain,  Orthopnea, PND, swelling in lower extremities, anasarca, dizziness, palpitations, syncope.   GI  No heartburn, indigestion, abdominal pain, nausea, vomiting, diarrhea, change in bowel habits, loss of appetite, bloody  stools.   Resp:  No chest wall deformity  Skin: no rash or lesions.  GU: no dysuria, change in color of urine, no urgency or frequency.  No flank pain, no hematuria   MS:  No joint pain or swelling.  No decreased range of motion.  No back pain.    Physical Exam  BP 128/70 (BP Location: Left Arm,  Cuff Size: Normal)   Pulse (!) 55   Ht 5\' 2"  (1.575 m)   Wt 133 lb 6.4 oz (60.5 kg)   SpO2 99%   BMI 24.40 kg/m   GEN: A/Ox3; pleasant , NAD, elderly , walks with cane .    HEENT:  [place]/AT,  EACs-clear, TMs-wnl, NOSE-clear, THROAT-clear, no lesions, no postnasal drip or exudate noted.   NECK:  Supple w/ fair ROM; no JVD; normal carotid impulses w/o bruits; no thyromegaly or nodules palpated; no lymphadenopathy.    RESP  Few trace rhonchi  no accessory muscle use, no dullness to percussion  CARD:  RRR, no m/r/g, no peripheral edema, pulses intact, no cyanosis or clubbing.  GI:   Soft & nt; nml bowel sounds; no organomegaly or masses detected.   Musco: Warm bil, no deformities or joint swelling noted.   Neuro: alert, no focal deficits noted.    Skin: Warm, no lesions or rashes    Lab Results:   BNP No results found for: BNP  ProBNP  Imaging: Dg Chest 2 View  Result Date: 01/28/2017 CLINICAL DATA:  Cough and congestion . EXAM: CHEST  2 VIEW COMPARISON:  CT 06/28/2016.  Chest x-ray 04/05/2016 . FINDINGS: Mediastinum hilar structures are normal. Lungs are clear. No pleural effusion or pneumothorax. Heart size normal. Degenerative changes thoracic spine. Surgical clips left upper abdomen. IMPRESSION: No acute cardiopulmonary disease. Electronically Signed   By: Marcello Moores  Register   On: 01/28/2017 15:19     Assessment & Plan:   Asthma Resolving exacerbation   Plan  Patient Instructions  Mucinex Twice daily  As needed  Cough/congestion  Flutter valve Three times a day  .  Continue Pulmicort Neb and Albuterol Neb Twice daily  .  Follow up up Dr. Lake Bells in January as  planned  and .As needed   Please contact office for sooner follow up if symptoms do not improve or worsen or seek emergency care         Chronic bronchitis ([place]) Resolving flare   Plan  Patient Instructions  Mucinex Twice daily  As needed  Cough/congestion  Flutter valve Three times a day  .  Continue Pulmicort Neb and Albuterol Neb Twice daily  .  Follow up up Dr. Lake Bells in January as planned  and .As needed   Please contact office for sooner follow up if symptoms do not improve or worsen or seek emergency care            Rexene Edison, NP 02/18/2017

## 2017-02-18 NOTE — Patient Instructions (Signed)
Continue doing brain stimulating activities (puzzles, reading, adult coloring books, staying active) to keep memory sharp.   Continue to eat heart healthy diet (full of fruits, vegetables, whole grains, lean protein, water--limit salt, fat, and sugar intake) and increase physical activity as tolerated.   Veronica [name] , Thank you for taking time to come for your Medicare Wellness Visit. I appreciate your ongoing commitment to your health goals. Please review the following plan we discussed and let me know if I can assist you in the future.   These are the goals we discussed: Be more socially active, I will go to [facility] and do something I enjoy and continue to watch [name] and/or start to visit churches Goals    None      This is a list of the screening recommended for you and due dates:  Health Maintenance  Topic Date Due  .  Hepatitis C: One time screening is recommended by Center for Disease Control  (CDC) for  adults born from [birth year] through 1966.   05/31/1950  . Mammogram  02/21/2012  . DEXA scan (bone density measurement)  03/23/2014  . Flu Shot  12/06/2017*  . Colon Cancer Screening  01/19/2019  . Tetanus Vaccine  02/12/2024  . Pneumonia vaccines  Completed  *Topic was postponed. The date shown is not the original due date.     Insomnia Insomnia is a sleep disorder that makes it difficult to fall asleep or to stay asleep. Insomnia can cause tiredness (fatigue), low energy, difficulty concentrating, mood swings, and poor performance at work or school. There are three different ways to classify insomnia:  Difficulty falling asleep.  Difficulty staying asleep.  Waking up too early in the morning.  Any type of insomnia can be long-term (chronic) or short-term (acute). Both are common. Short-term insomnia usually lasts for three months or less. Chronic insomnia occurs at least three times a week for longer than three months. What are the causes? Insomnia may be  caused by another condition, situation, or substance, such as:  Anxiety.  Certain medicines.  Gastroesophageal reflux disease (GERD) or other gastrointestinal conditions.  Asthma or other breathing conditions.  Restless legs syndrome, sleep apnea, or other sleep disorders.  Chronic pain.  Menopause. This may include hot flashes.  Stroke.  Abuse of alcohol, tobacco, or illegal drugs.  Depression.  Caffeine.  Neurological disorders, such as Alzheimer disease.  An overactive thyroid (hyperthyroidism).  The cause of insomnia may not be known. What increases the risk? Risk factors for insomnia include:  Gender. Women are more commonly affected than men.  Age. Insomnia is more common as you get older.  Stress. This may involve your professional or personal life.  Income. Insomnia is more common in people with lower income.  Lack of exercise.  Irregular work schedule or night shifts.  Traveling between different time zones.  What are the signs or symptoms? If you have insomnia, trouble falling asleep or trouble staying asleep is the main symptom. This may lead to other symptoms, such as:  Feeling fatigued.  Feeling nervous about going to sleep.  Not feeling rested in the morning.  Having trouble concentrating.  Feeling irritable, anxious, or depressed.  How is this treated? Treatment for insomnia depends on the cause. If your insomnia is caused by an underlying condition, treatment will focus on addressing the condition. Treatment may also include:  Medicines to help you sleep.  Counseling or therapy.  Lifestyle adjustments.  Follow these instructions at home:  Take medicines only as directed by your health care provider.  Keep regular sleeping and waking hours. Avoid naps.  Keep a sleep diary to help you and your health care provider figure out what could be causing your insomnia. Include: ? When you sleep. ? When you wake up during the  night. ? How well you sleep. ? How rested you feel the next day. ? Any side effects of medicines you are taking. ? What you eat and drink.  Make your bedroom a comfortable place where it is easy to fall asleep: ? Put up shades or special blackout curtains to block light from outside. ? Use a white noise machine to block noise. ? Keep the temperature cool.  Exercise regularly as directed by your health care provider. Avoid exercising right before bedtime.  Use relaxation techniques to manage stress. Ask your health care provider to suggest some techniques that may work well for you. These may include: ? Breathing exercises. ? Routines to release muscle tension. ? Visualizing peaceful scenes.  Cut back on alcohol, caffeinated beverages, and cigarettes, especially close to bedtime. These can disrupt your sleep.  Do not overeat or eat spicy foods right before bedtime. This can lead to digestive discomfort that can make it hard for you to sleep.  Limit screen use before bedtime. This includes: ? Watching TV. ? Using your smartphone, tablet, and computer.  Stick to a routine. This can help you fall asleep faster. Try to do a quiet activity, brush your teeth, and go to bed at the same time each night.  Get out of bed if you are still awake after 15 minutes of trying to sleep. Keep the lights down, but try reading or doing a quiet activity. When you feel sleepy, go back to bed.  Make sure that you drive carefully. Avoid driving if you feel very sleepy.  Keep all follow-up appointments as directed by your health care provider. This is important. Contact a health care provider if:  You are tired throughout the day or have trouble in your daily routine due to sleepiness.  You continue to have sleep problems or your sleep problems get worse. Get help right away if:  You have serious thoughts about hurting yourself or someone else. This information is not intended to replace advice given  to you by your health care provider. Make sure you discuss any questions you have with your health care provider. Document Released: 02/27/2000 Document Revised: 08/01/2015 Document Reviewed: 11/30/2013 Elsevier Interactive Patient Education  [employer].

## 2017-02-18 NOTE — Patient Instructions (Addendum)
Mucinex Twice daily  As needed  Cough/congestion  Flutter valve Three times a day  .  Continue Pulmicort Neb and Albuterol Neb Twice daily  .  Follow up up Dr. Lake Bells in January as planned  and .As needed   Please contact office for sooner follow up if symptoms do not improve or worsen or seek emergency care

## 2017-02-21 ENCOUNTER — Ambulatory Visit: Payer: Medicare Other | Admitting: Psychology

## 2017-02-24 ENCOUNTER — Telehealth: Payer: Self-pay | Admitting: *Deleted

## 2017-02-24 DIAGNOSIS — Z1231 Encounter for screening mammogram for malignant neoplasm of breast: Secondary | ICD-10-CM

## 2017-02-24 DIAGNOSIS — E2839 Other primary ovarian failure: Secondary | ICD-10-CM

## 2017-02-24 NOTE — Telephone Encounter (Addendum)
Called and spoke to patient to inform her that she will need to call Dr. Reddy's office to make an appointment as they require that their patients call and do not take referrals from PCP. [name] from Dr. Ephriam Jenkins office stated that they do take Medicare but the patient will need to pay $150.00 installment before her first visit. Nurse advised Veronica [name] to call and inquire how much each visit will cost after they file to Medicare before she makes an appointment to avoid not being able to afford to pay for the visit. Also, stated that nurse will mail patient the application for mediation assistance for vraylar. Patient understood she would need to fill out her sections and return the document for PCP's office to complete. Then the application would be sent to [employer] for approval. Patient was appreciative of the call and verbalized understanding. Patient requested to have mammogram and bone density referrals, orders were put in for both screenings.

## 2017-02-25 ENCOUNTER — Ambulatory Visit (INDEPENDENT_AMBULATORY_CARE_PROVIDER_SITE_OTHER): Payer: Medicare Other | Admitting: Psychology

## 2017-02-25 DIAGNOSIS — F331 Major depressive disorder, recurrent, moderate: Secondary | ICD-10-CM | POA: Diagnosis not present

## 2017-03-01 ENCOUNTER — Telehealth: Payer: Self-pay | Admitting: *Deleted

## 2017-03-01 NOTE — Telephone Encounter (Signed)
Pt is here seeing Sharee Pimple, RN for her wellness check. She is requesting a refill on her Klonopin 90 day script. Marjo Bicker, RN per chart MD gave her rx on 12/17/16 #225 (90 day) and have 1 refill left. Per Sharee Pimple patient states she had lost the prescription. Inform Sharee Pimple to inform pt will f/u, but MD is out of the office until tomorrow. He will be the only one to ok the 90 day, but will check the control website to see when was the last filled date, and if rx hasn't been filled we will call into the pharmacy. Will give hear call back w/status...Johny Chess

## 2017-03-01 NOTE — Telephone Encounter (Signed)
.  Check [place] registry last filled klonopin 12/27/2016. Also called [facility] spoke w/pharmacist Durene Fruits he confirm filled date of 12/27/16 # 225. He states pt next refill date would be mid Jan. Will forward to MD desktop for his response once he return back tomorrow...Veronica [name]

## 2017-03-02 NOTE — Telephone Encounter (Signed)
I'm not sure I understand. What Rx has she lost? Thx

## 2017-03-03 ENCOUNTER — Other Ambulatory Visit: Payer: Self-pay

## 2017-03-03 MED ORDER — FUROSEMIDE 20 MG PO TABS: 20.0000 mg | ORAL_TABLET | Freq: Every day | ORAL | 3 refills | Status: DC | PRN

## 2017-03-03 NOTE — Telephone Encounter (Signed)
Called pt to verify the klonopin msg that she left. Pt states she thought she had lost the prescription that MD gave her. She went by the pharmacy yesterday and spoke w/them and they told her that she had 1 additional refill on the Klonopin, but its not due for refill until 03/29/17. Pt did not know that the script that she gave pharmacy in Oct had a refill. She states she is ok, and to disregard the msg, and inform MD she was sorry, and Merry Christmas...Johny Chess

## 2017-03-03 NOTE — Telephone Encounter (Signed)
Ok Thx 

## 2017-03-04 ENCOUNTER — Other Ambulatory Visit: Payer: Self-pay | Admitting: *Deleted

## 2017-03-04 NOTE — Patient Outreach (Signed)
[facility]) Care Management  03/04/2017  ALLYSSA ABRUZZESE 1950/11/15 [id number]   Telephone Screen  Referral Date: 03/03/17 Referral Source: [place] Referral Reason: "Patient has financial hardships of being able to pay for essential bills, medications, and food. Insurance: Medicare  Outreach attempt # 1 to patient. HIPAA verified with patient. Patient confirmed having difficulty with affording Vaylar. Vaylar was prescribed by MD for patient's diagnosis of depression. Per patient, Bailey Mech has helped her tremendously and does not cause side effects. Patient verbalized having multiple medication allergies. She takes greater than 10 medications per day. Patient voiced having a history of brain surgery, COPD, and a CVA with deficits. Patient uses assistive devices to complete her ADL's, which requires time to complete. She lives alone and doesn't have a significant caregiver or any home health services. Patient reported, "I have been very ill lately". Patient has a pulmonary visit scheduled on 03/21/17 and a primary MD appointment on 03/18/16. She asked about a Lifeline due to living alone. [facility] services and benefits explained to patient. Patient agreed to services.    Plan: RN CM advised patient to contact RNCM for any needs or concerns. RN CM advised patient to alert MD for any changes in conditions.  RN CM provided patient with [facility] 24hr Nurse Line contact info. RN CM will send [facility] SW referral for possible assistance with community resources. RN CM will send [facility] pharmacy referral assistance with affording medications and taking greater than 10 medications.   Lake Bells, RN, BSN, MHA/MSL, [place] Telephonic Care Manager Coordinator [facility] Direct Phone: [phone] Cell Phone: [phone] Toll Free: [phone] Fax: [phone]

## 2017-03-09 ENCOUNTER — Encounter: Payer: Self-pay | Admitting: *Deleted

## 2017-03-09 ENCOUNTER — Other Ambulatory Visit: Payer: Self-pay | Admitting: *Deleted

## 2017-03-09 ENCOUNTER — Telehealth: Payer: Self-pay

## 2017-03-09 NOTE — Patient Outreach (Signed)
[facility]) Care Management  03/09/2017  [name] 1951/02/05 [id number]   CSW made an initial attempt to try and contact patient today to perform phone assessment, as well as assess and assist with social needs and services, without success.  A HIPAA compliant message was left for patient on voicemail.  CSW is currently awaiting a return call. CSW will make a second outreach attempt within the next week, if CSW does not receive a return call from patient in the meantime. Nat Christen, BSW, MSW, LCSW  Licensed Education officer, environmental Health System  Mailing [place] [address] [place], [place] [address] Physical Address-[address], [place], [place] [address] Toll Free Main # [phone] Fax # [phone] Cell # [phone]  Office # [phone] [email or web address]

## 2017-03-09 NOTE — Telephone Encounter (Signed)
PA started on CoverMyMeds KEY: [id number]

## 2017-03-10 NOTE — Telephone Encounter (Signed)
What is covered? Thx

## 2017-03-10 NOTE — Telephone Encounter (Signed)
PA for Vraylar denied, please advise

## 2017-03-17 ENCOUNTER — Other Ambulatory Visit: Payer: Self-pay

## 2017-03-17 MED ORDER — SODIUM CHLORIDE 3 % IN NEBU: INHALATION_SOLUTION | RESPIRATORY_TRACT | 2 refills | Status: DC

## 2017-03-18 ENCOUNTER — Ambulatory Visit (INDEPENDENT_AMBULATORY_CARE_PROVIDER_SITE_OTHER): Payer: Medicare Other | Admitting: Internal Medicine

## 2017-03-18 ENCOUNTER — Other Ambulatory Visit: Payer: Self-pay | Admitting: *Deleted

## 2017-03-18 ENCOUNTER — Encounter: Payer: Self-pay | Admitting: Internal Medicine

## 2017-03-18 DIAGNOSIS — J4489 Other specified chronic obstructive pulmonary disease: Secondary | ICD-10-CM

## 2017-03-18 DIAGNOSIS — E559 Vitamin D deficiency, unspecified: Secondary | ICD-10-CM

## 2017-03-18 DIAGNOSIS — L509 Urticaria, unspecified: Secondary | ICD-10-CM

## 2017-03-18 DIAGNOSIS — F329 Major depressive disorder, single episode, unspecified: Secondary | ICD-10-CM | POA: Diagnosis not present

## 2017-03-18 DIAGNOSIS — J4531 Mild persistent asthma with (acute) exacerbation: Secondary | ICD-10-CM | POA: Diagnosis not present

## 2017-03-18 DIAGNOSIS — I10 Essential (primary) hypertension: Secondary | ICD-10-CM | POA: Diagnosis not present

## 2017-03-18 DIAGNOSIS — G8929 Other chronic pain: Secondary | ICD-10-CM

## 2017-03-18 DIAGNOSIS — E538 Deficiency of other specified B group vitamins: Secondary | ICD-10-CM | POA: Diagnosis not present

## 2017-03-18 DIAGNOSIS — E539 Vitamin B deficiency, unspecified: Secondary | ICD-10-CM | POA: Diagnosis not present

## 2017-03-18 DIAGNOSIS — J449 Chronic obstructive pulmonary disease, unspecified: Secondary | ICD-10-CM

## 2017-03-18 DIAGNOSIS — E875 Hyperkalemia: Secondary | ICD-10-CM

## 2017-03-18 DIAGNOSIS — M545 Low back pain, unspecified: Secondary | ICD-10-CM

## 2017-03-18 DIAGNOSIS — F419 Anxiety disorder, unspecified: Secondary | ICD-10-CM

## 2017-03-18 MED ORDER — ACETAMINOPHEN-CODEINE #3 300-30 MG PO TABS: 1.0000 | ORAL_TABLET | ORAL | 3 refills | Status: DC | PRN

## 2017-03-18 MED ORDER — PROMETHAZINE-CODEINE 6.25-10 MG/5ML PO SYRP: 5.0000 mL | ORAL_SOLUTION | Freq: Four times a day (QID) | ORAL | 1 refills | Status: DC | PRN

## 2017-03-18 MED ORDER — LEVOTHYROXINE SODIUM 88 MCG PO TABS: 88.0000 ug | ORAL_TABLET | Freq: Every day | ORAL | 3 refills | Status: DC

## 2017-03-18 MED ORDER — CLONAZEPAM 1 MG PO TABS: ORAL_TABLET | ORAL | 1 refills | Status: DC

## 2017-03-18 MED ORDER — "INSULIN SYRINGE-NEEDLE U-100 26G X 1/2"" 1 ML MISC": 1.0000 | 5 refills | Status: DC

## 2017-03-18 MED ORDER — CYANOCOBALAMIN 1000 MCG/ML IJ SOLN: 1000.0000 ug | INTRAMUSCULAR | 11 refills | Status: DC

## 2017-03-18 NOTE — Assessment & Plan Note (Signed)
Vit D 

## 2017-03-18 NOTE — Assessment & Plan Note (Signed)
Chronic Pulmicort, Combivent Prom-cod syr

## 2017-03-18 NOTE — Assessment & Plan Note (Signed)
On B12 

## 2017-03-18 NOTE — Assessment & Plan Note (Addendum)
Vraylar via assist program - try 3 mg/d now

## 2017-03-18 NOTE — Patient Outreach (Signed)
[facility]) Care Management  03/18/2017  Veronica [name] Aug 03, 1950 [id number]   CSW made a second attempt to try and contact patient today to perform phone assessment, as well as assess and assist with social needs and services, without success.  A HIPAA compliant message was left for patient on voicemail.  CSW is currently awaiting a return call. CSW will make a third and final outreach attempt within the next week, if CSW does not receive a return call from patient in the meantime. Nat Christen, BSW, MSW, LCSW  Licensed Education officer, environmental Health System  Mailing [place] [address], [place], [place] [address] Physical Address-[address], [place], [place] [address] Toll Free Main # [phone] Fax # [phone] Cell # [phone]  Office # [phone] [email or web address]

## 2017-03-18 NOTE — Progress Notes (Signed)
Subjective:  Patient ID: Veronica [name], female    DOB: 1951-01-13  Age: 67 y.o. MRN: [id number]  CC: No chief complaint on file.   HPI Veronica [name] presents for anxiety, depression - better, COPD, LBP f/u   Outpatient Medications Prior to Visit  Medication Sig Dispense Refill  . albuterol (PROVENTIL) (2.5 MG/3ML) 0.083% nebulizer solution Take 3 mLs (2.5 mg total) by nebulization 2 (two) times daily. 60 mL 3  . aspirin 81 MG chewable tablet Chew by mouth daily.    . budesonide (PULMICORT) 0.25 MG/2ML nebulizer solution Take 2 mLs (0.25 mg total) by nebulization 2 (two) times daily. 120 mL 3  . Cariprazine HCl (VRAYLAR) 1.5 MG CAPS Take 1 capsule (1.5 mg total) by mouth daily. 30 capsule 2  . Cholecalciferol 1000 units tablet Take 1,000 Units by mouth daily.    . cyclobenzaprine (FLEXERIL) 10 MG tablet Take 1 tablet (10 mg total) by mouth 3 (three) times daily as needed for muscle spasms. 90 tablet 0  . Docusate Calcium (STOOL SOFTENER PO) Take 100 mg by mouth as needed (for constipation).     [name] escitalopram (LEXAPRO) 20 MG tablet Take 1 tablet (20 mg total) by mouth daily. 90 tablet 3  . furosemide (LASIX) 20 MG tablet Take 1 tablet (20 mg total) by mouth daily as needed. 90 tablet 3  . magic mouthwash SOLN Take 5 mLs by mouth 4 (four) times daily. Swish, hold and spit out---Do  Not swallow.  100 ml of dexamethasone 0.5 mg per 5 ml elixir 60 ml nystatin 100,000 Unit 100 ml of diphenhydramine 12.5 mg per 5 ml elixir 260 mL 3  . nitroGLYCERIN (NITROSTAT) 0.4 MG SL tablet PLACE 1 TABLET UNDER THE TONGUE EVERY 5 MINUTES AS NEEDED FOR CHEST PAIN 25 tablet 3  . phenytoin (DILANTIN) 100 MG ER capsule 200 mg every morning and 100 mg every evening, DILANTIN "brand name" only 270 capsule 2  . PROAIR HFA 108 (90 Base) MCG/ACT inhaler INHALE 2 PUFFS INTO THE LUNGS EVERY 4 HOURS AS NEEDED FOR WHEEZING OR SHORTNESS OF BREATH 17 g 0  . promethazine (PHENERGAN) 25 MG tablet Take 0.5 tablets  (12.5 mg total) by mouth every 6 (six) hours as needed. 60 tablet 1  . Respiratory Therapy Supplies (FLUTTER) DEVI Use after nebulization treatments 1 each 0  . sodium chloride (MURO 128) 5 % ophthalmic solution Place 1 drop into both eyes as needed for irritation.     . sodium chloride HYPERTONIC 3 % nebulizer solution USE 1 VIAL VIA NEBULIZER TWICE DAILY 240 mL 2  . traZODone (DESYREL) 50 MG tablet Take 4 tablets (200 mg total) by mouth at bedtime. 360 tablet 3  . verapamil (CALAN-SR) 240 MG CR tablet Take 1 tablet (240 mg total) by mouth at bedtime. 90 tablet 3  . acetaminophen-codeine (TYLENOL #3) 300-30 MG tablet Take 1 tablet by mouth every 4 (four) hours as needed. 60 tablet 3  . clonazePAM (KLONOPIN) 1 MG tablet 2 po qhs and 1/2 tab in addition in daytime prn 225 tablet 1  . cyanocobalamin (,VITAMIN B-12,) 1000 MCG/ML injection Inject 1 mL (1,000 mcg total) into the muscle every 14 (fourteen) days. 6 mL 11  . Insulin Syringe-Needle U-100 (B-D INSULIN SYRINGE 1CC/26G) 26G X 1/2" 1 ML MISC Inject 1 each into the skin as directed. 100 each 5  . levothyroxine (SYNTHROID, LEVOTHROID) 88 MCG tablet Take 1 tablet (88 mcg total) by mouth daily before breakfast. 90 tablet 3  .  promethazine-codeine (PHENERGAN WITH CODEINE) 6.25-10 MG/5ML syrup Take 5 mLs by mouth every 6 (six) hours as needed for cough. 300 mL 1  . atorvastatin (LIPITOR) 40 MG tablet Take 1 tablet (40 mg total) by mouth daily. 30 tablet 11  . nitroGLYCERIN (NITROSTAT) 0.4 MG SL tablet PLACE 1 TABLET UNDER THE TONGUE EVERY 5 MINUTES AS NEEDED FOR CHEST PAIN 275 tablet 3   No facility-administered medications prior to visit.     ROS Review of Systems  Constitutional: Positive for fatigue. Negative for activity change, appetite change, chills and unexpected weight change.  HENT: Negative for congestion, mouth sores and sinus pressure.   Eyes: Negative for visual disturbance.  Respiratory: Positive for cough. Negative for chest  tightness.   Gastrointestinal: Negative for abdominal pain and nausea.  Genitourinary: Negative for difficulty urinating, frequency and vaginal pain.  Musculoskeletal: Positive for arthralgias, back pain and gait problem.  Skin: Negative for pallor and rash.  Neurological: Positive for weakness. Negative for dizziness, tremors, numbness and headaches.  Psychiatric/Behavioral: Positive for dysphoric mood. Negative for confusion, sleep disturbance and suicidal ideas. The patient is nervous/anxious.     Objective:  BP 132/68 (BP Location: Left Arm, Patient Position: Sitting, Cuff Size: Normal)   Pulse 67   Temp 98.6 F (37 C) (Oral)   Ht 5\' 2"  (1.575 m)   Wt 127 lb (57.6 kg)   SpO2 98%   BMI 23.23 kg/m   BP Readings from Last 3 Encounters:  03/18/17 132/68  02/18/17 128/70  02/18/17 134/72    Wt Readings from Last 3 Encounters:  03/18/17 127 lb (57.6 kg)  02/18/17 133 lb 6.4 oz (60.5 kg)  02/18/17 127 lb (57.6 kg)    Physical Exam  Constitutional: She appears well-developed. No distress.  HENT:  Head: Normocephalic.  Right Ear: External ear normal.  Left Ear: External ear normal.  Nose: Nose normal.  Mouth/Throat: Oropharynx is clear and moist.  Eyes: Conjunctivae are normal. Pupils are equal, round, and reactive to light. Right eye exhibits no discharge. Left eye exhibits no discharge.  Neck: Normal range of motion. Neck supple. No JVD present. No tracheal deviation present. No thyromegaly present.  Cardiovascular: Normal rate, regular rhythm and normal heart sounds.  Pulmonary/Chest: No stridor. No respiratory distress. She has no wheezes.  Abdominal: Soft. Bowel sounds are normal. She exhibits no distension and no mass. There is no tenderness. There is no rebound and no guarding.  Musculoskeletal: She exhibits no edema or tenderness.  Lymphadenopathy:    She has no cervical adenopathy.  Neurological: She displays normal reflexes. No cranial nerve deficit. She exhibits  normal muscle tone. Coordination abnormal.  Skin: No rash noted. No erythema.  Psychiatric: She has a normal mood and affect. Her behavior is normal. Judgment and thought content normal.   [place]  Lab Results  Component Value Date   WBC 10.0 05/31/2016   HGB 14.6 05/31/2016   HCT 43.0 05/31/2016   PLT 365.0 05/31/2016   GLUCOSE 92 12/03/2016   CHOL 175 11/30/2016   TRIG 81 11/30/2016   HDL 63 11/30/2016   LDLDIRECT 165.0 09/26/2009   LDLCALC 96 11/30/2016   ALT 7 12/03/2016   AST 15 12/03/2016   NA 139 12/03/2016   K 4.6 12/03/2016   CL 100 12/03/2016   CREATININE 0.60 12/03/2016   BUN 7 12/03/2016   CO2 32 12/03/2016   TSH 1.59 12/03/2016   INR 1.0 ratio 11/07/2009    Dg Chest 2 View  Result Date:  01/28/2017 CLINICAL DATA:  Cough and congestion . EXAM: CHEST  2 VIEW COMPARISON:  CT 06/28/2016.  Chest x-ray 04/05/2016 . FINDINGS: Mediastinum hilar structures are normal. Lungs are clear. No pleural effusion or pneumothorax. Heart size normal. Degenerative changes thoracic spine. Surgical clips left upper abdomen. IMPRESSION: No acute cardiopulmonary disease. Electronically Signed   By: Marcello Moores  Register   On: 01/28/2017 15:19    Assessment & Plan:   Diagnoses and all orders for this visit:  Urticaria -     cyanocobalamin (,VITAMIN B-12,) 1000 MCG/ML injection; Inject 1 mL (1,000 mcg total) into the muscle every 14 (fourteen) days. -     Insulin Syringe-Needle U-100 (B-D INSULIN SYRINGE 1CC/26G) 26G X 1/2" 1 ML MISC; Inject 1 each into the skin as directed.  HYPERKALEMIA -     cyanocobalamin (,VITAMIN B-12,) 1000 MCG/ML injection; Inject 1 mL (1,000 mcg total) into the muscle every 14 (fourteen) days. -     Insulin Syringe-Needle U-100 (B-D INSULIN SYRINGE 1CC/26G) 26G X 1/2" 1 ML MISC; Inject 1 each into the skin as directed. -     acetaminophen-codeine (TYLENOL #3) 300-30 MG tablet; Take 1 tablet by mouth every 4 (four) hours as needed.  B12 deficiency -      cyanocobalamin (,VITAMIN B-12,) 1000 MCG/ML injection; Inject 1 mL (1,000 mcg total) into the muscle every 14 (fourteen) days. -     Insulin Syringe-Needle U-100 (B-D INSULIN SYRINGE 1CC/26G) 26G X 1/2" 1 ML MISC; Inject 1 each into the skin as directed.  Vitamin D deficiency -     cyanocobalamin (,VITAMIN B-12,) 1000 MCG/ML injection; Inject 1 mL (1,000 mcg total) into the muscle every 14 (fourteen) days. -     Insulin Syringe-Needle U-100 (B-D INSULIN SYRINGE 1CC/26G) 26G X 1/2" 1 ML MISC; Inject 1 each into the skin as directed. -     acetaminophen-codeine (TYLENOL #3) 300-30 MG tablet; Take 1 tablet by mouth every 4 (four) hours as needed.  URTICARIA -     acetaminophen-codeine (TYLENOL #3) 300-30 MG tablet; Take 1 tablet by mouth every 4 (four) hours as needed.  Chronic obstructive airway disease with asthma (HCC) -     acetaminophen-codeine (TYLENOL #3) 300-30 MG tablet; Take 1 tablet by mouth every 4 (four) hours as needed.  Vitamin B-complex deficiency -     acetaminophen-codeine (TYLENOL #3) 300-30 MG tablet; Take 1 tablet by mouth every 4 (four) hours as needed.  Other orders -     levothyroxine (SYNTHROID, LEVOTHROID) 88 MCG tablet; Take 1 tablet (88 mcg total) by mouth daily before breakfast. -     promethazine-codeine (PHENERGAN WITH CODEINE) 6.25-10 MG/5ML syrup; Take 5 mLs by mouth every 6 (six) hours as needed for cough. -     clonazePAM (KLONOPIN) 1 MG tablet; 2 po qhs and 1/2 tab in addition in daytime prn   I am having Veronica [name] maintain her Docusate Calcium (STOOL SOFTENER PO), Cholecalciferol, sodium chloride, promethazine, magic mouthwash, FLUTTER, albuterol, budesonide, atorvastatin, cyclobenzaprine, traZODone, phenytoin, verapamil, PROAIR HFA, aspirin, escitalopram, cariprazine, nitroGLYCERIN, furosemide, sodium chloride HYPERTONIC, levothyroxine, promethazine-codeine, clonazePAM, cyanocobalamin, Insulin Syringe-Needle U-100, and acetaminophen-codeine.  Meds  ordered this encounter  Medications  . levothyroxine (SYNTHROID, LEVOTHROID) 88 MCG tablet    Sig: Take 1 tablet (88 mcg total) by mouth daily before breakfast.    Dispense:  90 tablet    Refill:  3  . promethazine-codeine (PHENERGAN WITH CODEINE) 6.25-10 MG/5ML syrup    Sig: Take 5 mLs by mouth every 6 (  six) hours as needed for cough.    Dispense:  300 mL    Refill:  1    This request is for a new prescription for a controlled substance as required by Federal/State law..  . clonazePAM (KLONOPIN) 1 MG tablet    Sig: 2 po qhs and 1/2 tab in addition in daytime prn    Dispense:  225 tablet    Refill:  1  . cyanocobalamin (,VITAMIN B-12,) 1000 MCG/ML injection    Sig: Inject 1 mL (1,000 mcg total) into the muscle every 14 (fourteen) days.    Dispense:  6 mL    Refill:  11  . Insulin Syringe-Needle U-100 (B-D INSULIN SYRINGE 1CC/26G) 26G X 1/2" 1 ML MISC    Sig: Inject 1 each into the skin as directed.    Dispense:  100 each    Refill:  5  . acetaminophen-codeine (TYLENOL #3) 300-30 MG tablet    Sig: Take 1 tablet by mouth every 4 (four) hours as needed.    Dispense:  60 tablet    Refill:  3     Follow-up: No Follow-up on file.  Walker Kehr, MD

## 2017-03-18 NOTE — Telephone Encounter (Signed)
Form filled out for pharmaceutical company to help supply pt with medication

## 2017-03-18 NOTE — Assessment & Plan Note (Signed)
Calan SR 

## 2017-03-18 NOTE — Assessment & Plan Note (Signed)
Vraylar via assist program

## 2017-03-18 NOTE — Assessment & Plan Note (Signed)
Tylenol #3 prn  Potential benefits of a long term opioids use as well as potential risks (i.e. addiction risk, apnea etc) and complications (i.e. Somnolence, constipation and others) were explained to the patient and were aknowledged. Flexeril  

## 2017-03-21 ENCOUNTER — Other Ambulatory Visit: Payer: Self-pay | Admitting: Pharmacist

## 2017-03-21 ENCOUNTER — Ambulatory Visit (INDEPENDENT_AMBULATORY_CARE_PROVIDER_SITE_OTHER): Payer: Medicare Other | Admitting: Psychology

## 2017-03-21 ENCOUNTER — Other Ambulatory Visit: Payer: Self-pay

## 2017-03-21 DIAGNOSIS — F331 Major depressive disorder, recurrent, moderate: Secondary | ICD-10-CM | POA: Diagnosis not present

## 2017-03-21 MED ORDER — ALBUTEROL SULFATE HFA 108 (90 BASE) MCG/ACT IN AERS: 1.0000 | INHALATION_SPRAY | RESPIRATORY_TRACT | 5 refills | Status: DC | PRN

## 2017-03-21 NOTE — Patient Outreach (Addendum)
[facility]) Care Management  03/21/2017  Veronica [name] 1950/08/21 [id number]  Patient was referred to [place] Pharmacist by [facility] RN Curly Shores for polypharmacy and medication assistance evaluation, specifically for Vraylar.    Noted per chart review appears PCP office may have started manufacturer patient assistance paperwork.   Plan:  Second phone outreach to patient in the next week.   Karrie Meres, PharmD, [place] [phone]  Addendum:  03/21/17 1517:  Patient returned call to [facility] Pharmacist---HIPAA details verified.  Explained purpose of call to patient.  Patient confirms she has completed [employer] patient assistance application for Vraylar and reports her PCP office is to fax to patient assistance program.   Discussed Social Security Administration Extra Help with patient---she reports she is not sure of her gross monthly income.   Offered to review medications with patient---she prefers a call back later to do this.   She also mentioned brand name Dilantin is expensive---appears this may be available via [employer] patient assistance program, which evaluates Part D beneficiaries on a case-by-case basis.  She is aware there is no guarantee she will be eligible for manufacturer patient assistance.    Plan:  Follow-up call scheduled with patient later this week.  Will discuss [employer] patient assistance program with patient at that call.   Karrie Meres, PharmD, [place] [phone]

## 2017-03-22 ENCOUNTER — Encounter: Payer: Self-pay | Admitting: *Deleted

## 2017-03-22 ENCOUNTER — Other Ambulatory Visit: Payer: Self-pay | Admitting: *Deleted

## 2017-03-22 NOTE — Patient Outreach (Signed)
[facility]) Care Management  03/22/2017  Veronica [name] 1950/07/25 [id number]   CSW made a third and final attempt to try and contact patient today to perform phone assessment, as well as assess and assist with social work needs and services, without success.  A HIPAA compliant message was left for patient on voicemail.  CSW  continues to await a return call.  CSW will mail an outreach letter to patient's home, encouraging patient to contact CSW at their earliest convenience, if patient is interested in receiving social work services through [place] with [facility].  If CSW does not receive a return call from patient within the next 10 business days, CSW will proceed with case closure.  Required number of phone attempts will have been made and outreach letter mailed.   Nat Christen, BSW, MSW, LCSW  Licensed Education officer, environmental Health System  Mailing [place] [address], [place], [place] [address] Physical Address-[address], [place], [place] [address] Toll Free Main # [phone] Fax # [phone] Cell # [phone]  Office # [phone] [email or web address]

## 2017-03-23 ENCOUNTER — Ambulatory Visit: Payer: Self-pay | Admitting: *Deleted

## 2017-03-23 ENCOUNTER — Ambulatory Visit: Payer: Self-pay | Admitting: Pharmacist

## 2017-03-24 ENCOUNTER — Other Ambulatory Visit: Payer: Self-pay

## 2017-03-24 ENCOUNTER — Other Ambulatory Visit: Payer: Self-pay | Admitting: Pharmacist

## 2017-03-24 MED ORDER — CARIPRAZINE HCL 1.5 MG PO CAPS: 1.5000 mg | ORAL_CAPSULE | Freq: Every day | ORAL | 3 refills | Status: DC

## 2017-03-24 NOTE — Patient Outreach (Signed)
[facility]) Care Management  03/24/2017  RALONDA TARTT Nov 09, 1950 [id number]  Successful phone outreach to patient---had planned to complete telephonic medication reconciliation with patient today.   Patient answered and identified self.  Patient reports she wants to review medications but was not at home at time of call and needed to re-schedule.    Plan:  Call scheduled for next week per patient request.    Karrie Meres, PharmD, [place] [phone]

## 2017-03-31 ENCOUNTER — Other Ambulatory Visit: Payer: Self-pay | Admitting: Pharmacist

## 2017-03-31 NOTE — Patient Outreach (Signed)
[facility]) Care Management  [name]   03/31/2017  Veronica [name] 1950-12-29 [id number]  Subjective:  Patient was referred to [place] Pharmacist by [facility] RN Curly Shores for polypharmacy and medication patient assistance evaluation.   Successful phone outreach to patient at scheduled time of call.  HIPAA details verified.    Patient reports being able to review her medications.  She reports medications of cost concern are atorvastatin, Dilantin (Uses brand only) and Vraylar.    She has a past medical history significant for hypertension, CAD, asthma, hypothyroidism, vitamin B 12 deficiency, depression.    Objective:   Current Medications: Current Outpatient Medications  Medication Sig Dispense Refill  . acetaminophen-codeine (TYLENOL #3) 300-30 MG tablet Take 1 tablet by mouth every 4 (four) hours as needed. 60 tablet 3  . albuterol (PROAIR HFA) 108 (90 Base) MCG/ACT inhaler Inhale 1-2 puffs into the lungs every 4 (four) hours as needed for wheezing or shortness of breath. 18 g 5  . albuterol (PROVENTIL) (2.5 MG/3ML) 0.083% nebulizer solution Take 3 mLs (2.5 mg total) by nebulization 2 (two) times daily. 60 mL 3  . aspirin 81 MG chewable tablet Chew by mouth daily.    [name] atorvastatin (LIPITOR) 40 MG tablet Take 1 tablet (40 mg total) by mouth daily. 30 tablet 11  . budesonide (PULMICORT) 0.25 MG/2ML nebulizer solution Take 2 mLs (0.25 mg total) by nebulization 2 (two) times daily. 120 mL 3  . cariprazine (VRAYLAR) capsule Take 1 capsule (1.5 mg total) by mouth daily. 90 capsule 3  . Cholecalciferol 1000 units tablet Take 1,000 Units by mouth daily.    . clonazePAM (KLONOPIN) 1 MG tablet 2 po qhs and 1/2 tab in addition in daytime prn 225 tablet 1  . cyanocobalamin (,VITAMIN B-12,) 1000 MCG/ML injection Inject 1 mL (1,000 mcg total) into the muscle every 14 (fourteen) days. 6 mL 11  . cyclobenzaprine (FLEXERIL) 10 MG tablet Take 1 tablet (10 mg total) by mouth 3  (three) times daily as needed for muscle spasms. 90 tablet 0  . Docusate Calcium (STOOL SOFTENER PO) Take 100 mg by mouth as needed (for constipation).     [name] escitalopram (LEXAPRO) 20 MG tablet Take 1 tablet (20 mg total) by mouth daily. 90 tablet 3  . furosemide (LASIX) 20 MG tablet Take 1 tablet (20 mg total) by mouth daily as needed. 90 tablet 3  . Insulin Syringe-Needle U-100 (B-D INSULIN SYRINGE 1CC/26G) 26G X 1/2" 1 ML MISC Inject 1 each into the skin as directed. 100 each 5  . levothyroxine (SYNTHROID, LEVOTHROID) 88 MCG tablet Take 1 tablet (88 mcg total) by mouth daily before breakfast. 90 tablet 3  . magic mouthwash SOLN Take 5 mLs by mouth 4 (four) times daily. Swish, hold and spit out---Do  Not swallow.  100 ml of dexamethasone 0.5 mg per 5 ml elixir 60 ml nystatin 100,000 Unit 100 ml of diphenhydramine 12.5 mg per 5 ml elixir 260 mL 3  . nitroGLYCERIN (NITROSTAT) 0.4 MG SL tablet PLACE 1 TABLET UNDER THE TONGUE EVERY 5 MINUTES AS NEEDED FOR CHEST PAIN 25 tablet 3  . phenytoin (DILANTIN) 100 MG ER capsule 200 mg every morning and 100 mg every evening, DILANTIN "brand name" only 270 capsule 2  . promethazine (PHENERGAN) 25 MG tablet Take 0.5 tablets (12.5 mg total) by mouth every 6 (six) hours as needed. 60 tablet 1  . promethazine-codeine (PHENERGAN WITH CODEINE) 6.25-10 MG/5ML syrup Take 5 mLs by mouth every  6 (six) hours as needed for cough. 300 mL 1  . Respiratory Therapy Supplies (FLUTTER) DEVI Use after nebulization treatments 1 each 0  . sodium chloride (MURO 128) 5 % ophthalmic solution Place 1 drop into both eyes as needed for irritation.     . sodium chloride HYPERTONIC 3 % nebulizer solution USE 1 VIAL VIA NEBULIZER TWICE DAILY 240 mL 2  . traZODone (DESYREL) 50 MG tablet Take 4 tablets (200 mg total) by mouth at bedtime. 360 tablet 3  . verapamil (CALAN-SR) 240 MG CR tablet Take 1 tablet (240 mg total) by mouth at bedtime. 90 tablet 3   No current facility-administered  medications for this visit.     Functional Status: In your present state of health, do you have any difficulty performing the following activities: 02/18/2017  Hearing? N  Vision? N  Difficulty concentrating or making decisions? N  Walking or climbing stairs? Y  Dressing or bathing? N  Doing errands, shopping? N  Preparing Food and eating ? N  Using the Toilet? N  In the past six months, have you accidently leaked urine? N  Do you have problems with loss of bowel control? N  Managing your Medications? N  Managing your Finances? N  Housekeeping or managing your Housekeeping? N  Some recent data might be hidden    Fall/Depression Screening: Fall Risk  02/18/2017 02/11/2017 12/03/2016  Falls in the past year? No No Yes  Number falls in past yr: - - 1  Injury with Fall? - - No  Risk for fall due to : - Impaired balance/gait -   PHQ 2/9 Scores 03/04/2017 02/18/2017 02/11/2017 10/07/2015 07/10/2014  PHQ - 2 Score 1 6 6  0 0  PHQ- 9 Score - 13 9 - -    Assessment:  Medication review per patient report and medications in this chart:  Drugs sorted by system:  Neurologic/Psychologic: -Vraylar -clonazepam  -escitalopram  -Dilantin (Brand name)  -trazodone   Cardiovascular: -aspirin -atorvastatin  -furosemide  -sublingaul nitroglycerin  -verapamil   Pulmonary/Allergy: -albuterol nebs -albuterol inhaler  -budesonide nebs -hypertonic sodium chloride nebs   Gastrointestinal: -docusate  -promethazine as needed   Endocrine: -levothyroxine  Pain: -acetaminophen with codeine as needed  Vitamins/Minerals: -vitamin D  -cyanocobalamin   Miscellaneous: -magic mouthwash -promethazine with codeine as needed  -sodium chloride (Muro 128) eye drops  -cyclobenzaprine   Drug interactions:   -Patient is on multiple medications that can cause CNS depression, dizziness, falls including----clonazepam, promethazine prn, cyclobenzaprine prn, trazodone.  Other issues noted:   -Promethazine and magic mouthwash were last written in 2017 based on medication list---patient encouraged to discuss with her prescriber if she is still to use as these prescriptions may be expired.    Counseled patient on increased risk of side effects/falls with her medications, including but not limited to promethazine, cyclobenzaprine, trazodone, clonazepam.    Patient assistance:  Patient reports prescriber office completed Vraylar patient assistance application.   Discussed Pf[employer] patient assistance program (Dilantin), including income requirements---patient wishes to apply.  She understands there is no guarantee she will be eligible.   Discussed with patient atorvastatin appears to be covered as a generic medication on her Part D plan.    Plan:  Will route note to PCP as update.   Will follow-up with PCP office regarding Vraylar patient assistance application to verify if it has been submitted.   Will get patient Pf[employer] patient assistance application mailed and either [facility] Pharmacist or [place] will follow-up  with patient.   Karrie Meres, PharmD, [place] [phone]

## 2017-04-01 ENCOUNTER — Encounter: Payer: Self-pay | Admitting: Pharmacist

## 2017-04-04 ENCOUNTER — Ambulatory Visit (INDEPENDENT_AMBULATORY_CARE_PROVIDER_SITE_OTHER): Payer: Medicare Other | Admitting: Psychology

## 2017-04-04 DIAGNOSIS — F331 Major depressive disorder, recurrent, moderate: Secondary | ICD-10-CM | POA: Diagnosis not present

## 2017-04-05 ENCOUNTER — Other Ambulatory Visit: Payer: Self-pay | Admitting: *Deleted

## 2017-04-05 NOTE — Patient Outreach (Signed)
[facility]) Care Management  04/05/2017  LATISHA LASCH 06-06-1950 [id number]   CSW will perform a case closure on patient, due to inability to establish initial phone contact, despite required number of phone attempts made and outreach letter mailed to patient's home allowing 10 business days for a response.  CSW will notify patient's Pharmacist with [facility], Karrie Meres of CSW's plans to close patient's case.  CSW will fax an update to patient's Primary Care Physician, Dr. Tyrone Apple Plotniov to ensure that they are aware of CSW's involvement with patient's plan of care.  CSW will submit a case closure request to Alycia Rossetti, Care Management Assistant with [facility], in the form of an [employer].  CSW will ensure that Mrs. Arelia Sneddon is aware of Mr. Pennie Rushing, Pharmacist with [facility], continued involvement with patient's care. Nat Christen, BSW, MSW, LCSW  Licensed Education officer, [facility]  Mailing [place] [address] [place], [place] [address] Physical Address-[address], [place], [place] [address] Toll Free Main # [phone] Fax # [phone] Cell # [phone]  Office # [phone] [email or web address]

## 2017-04-06 ENCOUNTER — Telehealth: Payer: Self-pay | Admitting: Internal Medicine

## 2017-04-06 ENCOUNTER — Other Ambulatory Visit: Payer: Self-pay | Admitting: Pharmacist

## 2017-04-06 NOTE — Telephone Encounter (Signed)
Copied from [place]. Topic: Inquiry >> Apr 06, 2017  2:06 PM Oliver Pila B wrote: Reason for CRM: [employer] called to check to see if the Vraylar pt assistance program form has been filled and faxed to the designated manufacturer, contact [employer] to update @ [phone]

## 2017-04-06 NOTE — Patient Outreach (Signed)
[facility]) Care Management  04/06/2017  CRYSTELLE FERRUFINO 02-Sep-1950 [id number]  Placed call to PCP office to verify if Vraylar patient assistance application was faxed to [place] Patient Assistance program.    Received message back from [place], with PCP office that application was faxed 09/04/27.     Plan:  Will continue to follow-up with patient regarding patient assistance for Vraylar and [place] Technician is working on Dilantin Therapist, music) Arts administrator.    Karrie Meres, PharmD, [place] [phone]

## 2017-04-06 NOTE — Telephone Encounter (Signed)
LM notifying form was faxed 03/24/17

## 2017-04-07 ENCOUNTER — Encounter: Payer: Self-pay | Admitting: Pulmonary Disease

## 2017-04-07 ENCOUNTER — Telehealth (HOSPITAL_COMMUNITY): Payer: Self-pay | Admitting: Pulmonary Disease

## 2017-04-07 ENCOUNTER — Ambulatory Visit (INDEPENDENT_AMBULATORY_CARE_PROVIDER_SITE_OTHER): Payer: Medicare Other | Admitting: Pulmonary Disease

## 2017-04-07 VITALS — BP 128/72 | HR 68 | Ht 62.0 in | Wt 128.0 lb

## 2017-04-07 DIAGNOSIS — J470 Bronchiectasis with acute lower respiratory infection: Secondary | ICD-10-CM | POA: Diagnosis not present

## 2017-04-07 DIAGNOSIS — J4531 Mild persistent asthma with (acute) exacerbation: Secondary | ICD-10-CM

## 2017-04-07 DIAGNOSIS — R0600 Dyspnea, unspecified: Secondary | ICD-10-CM | POA: Diagnosis not present

## 2017-04-07 DIAGNOSIS — J42 Unspecified chronic bronchitis: Secondary | ICD-10-CM | POA: Diagnosis not present

## 2017-04-07 MED ORDER — FIRST-DUKES MOUTHWASH MT SUSP: 5.0000 mL | Freq: Two times a day (BID) | OROMUCOSAL | 0 refills | Status: DC
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
## 2017-04-07 NOTE — Patient Instructions (Signed)
Bronchiectasis: Continue hypertonic saline twice a day Continue the flutter valve twice a day  Asthma: Continue Pulmicort twice a day  Oral thrush: We will prescribe Magic mouthwash for use for this condition  Shortness of breath or leg swelling: We will check an echocardiogram We will check an ambulatory oximetry test today  We will see you back in 4-6 weeks to go over these results

## 2017-04-11 NOTE — Telephone Encounter (Signed)
User: Veronica [name] A Date/time: 04/07/17 1:28 PM  Comment: Called pt and lmsg for her to CB to get sch for echo.Vassie Moment  Context:  Outcome: Left Message  Phone number: [phone] Phone Type: Home Phone  Comm. type: Telephone Call type: Outgoing  Contact: Irven Coe D Relation to patient: Self

## 2017-04-12 ENCOUNTER — Other Ambulatory Visit: Payer: Self-pay

## 2017-04-12 ENCOUNTER — Ambulatory Visit (HOSPITAL_COMMUNITY): Payer: Medicare Other | Attending: Cardiovascular Disease

## 2017-04-12 DIAGNOSIS — Z8249 Family history of ischemic heart disease and other diseases of the circulatory system: Secondary | ICD-10-CM | POA: Insufficient documentation

## 2017-04-12 DIAGNOSIS — I10 Essential (primary) hypertension: Secondary | ICD-10-CM | POA: Diagnosis not present

## 2017-04-12 DIAGNOSIS — I340 Nonrheumatic mitral (valve) insufficiency: Secondary | ICD-10-CM | POA: Insufficient documentation

## 2017-04-12 DIAGNOSIS — R0600 Dyspnea, unspecified: Secondary | ICD-10-CM | POA: Diagnosis present

## 2017-04-12 DIAGNOSIS — E785 Hyperlipidemia, unspecified: Secondary | ICD-10-CM | POA: Insufficient documentation

## 2017-04-12 DIAGNOSIS — J449 Chronic obstructive pulmonary disease, unspecified: Secondary | ICD-10-CM | POA: Insufficient documentation

## 2017-04-12 DIAGNOSIS — Z8673 Personal history of transient ischemic attack (TIA), and cerebral infarction without residual deficits: Secondary | ICD-10-CM | POA: Insufficient documentation

## 2017-04-13 ENCOUNTER — Inpatient Hospital Stay: Admission: RE | Admit: 2017-04-13 | Payer: Self-pay | Source: Ambulatory Visit

## 2017-04-13 ENCOUNTER — Ambulatory Visit
Admission: RE | Admit: 2017-04-13 | Discharge: 2017-04-13 | Disposition: A | Discharge status: Home / Self Care | Payer: Medicare Other | Source: Ambulatory Visit | Attending: Internal Medicine | Admitting: Internal Medicine

## 2017-04-13 DIAGNOSIS — Z1231 Encounter for screening mammogram for malignant neoplasm of breast: Secondary | ICD-10-CM

## 2017-04-14 ENCOUNTER — Other Ambulatory Visit: Payer: Self-pay | Admitting: Pharmacy Technician

## 2017-04-14 NOTE — Patient Outreach (Signed)
[facility]) Care Management  04/14/2017  ERICA RICHWINE Aug 30, 1950 [id number]  Successful patient outreach call in reference to [place] patient assistance application for Dilantin. HIPAA identifiers verified and verbal consent received. This call was to follow-up with the patient and see if she has received the application. The patient has not received the application as of yet but will be on the lookout for it. She will contact me with any questions that she has and if I will also call her back not week to make sure the application was received.   [name], [place] [phone]

## 2017-04-18 ENCOUNTER — Ambulatory Visit (INDEPENDENT_AMBULATORY_CARE_PROVIDER_SITE_OTHER): Payer: Medicare Other | Admitting: Psychology

## 2017-04-18 DIAGNOSIS — F331 Major depressive disorder, recurrent, moderate: Secondary | ICD-10-CM

## 2017-04-21 ENCOUNTER — Ambulatory Visit: Payer: Self-pay | Admitting: Pharmacy Technician

## 2017-04-27 ENCOUNTER — Ambulatory Visit
Admission: RE | Admit: 2017-04-27 | Discharge: 2017-04-27 | Disposition: A | Discharge status: Home / Self Care | Payer: Medicare Other | Source: Ambulatory Visit | Attending: Internal Medicine | Admitting: Internal Medicine

## 2017-04-27 DIAGNOSIS — E2839 Other primary ovarian failure: Secondary | ICD-10-CM

## 2017-05-02 ENCOUNTER — Ambulatory Visit (INDEPENDENT_AMBULATORY_CARE_PROVIDER_SITE_OTHER): Payer: Medicare Other | Admitting: Psychology

## 2017-05-02 DIAGNOSIS — F331 Major depressive disorder, recurrent, moderate: Secondary | ICD-10-CM

## 2017-05-03 ENCOUNTER — Other Ambulatory Visit: Payer: Self-pay | Admitting: Pharmacy Technician

## 2017-05-03 NOTE — Patient Outreach (Signed)
[facility]) Care Management  05/03/2017  Veronica [name] 10-23-1950 [id number]  Successful patient outreach call in reference to [place] patient assistance application for Dilantin that was mailed on 1/24. HIPAA identifiers verified and verbal consent received. Per our last conversation the patient had not received the application. She stated today that she received it a week later and still has not completed the application. She asked me to resend the application in case she can't find the one that she received. I offered to schedule an appointment to expedite the process but the patient declined. I will mail another application and follow-up with the patient in 10 business days.  [name], [place] [phone]

## 2017-05-12 ENCOUNTER — Ambulatory Visit (INDEPENDENT_AMBULATORY_CARE_PROVIDER_SITE_OTHER): Payer: Medicare Other | Admitting: Pulmonary Disease

## 2017-05-12 ENCOUNTER — Encounter: Payer: Self-pay | Admitting: Pulmonary Disease

## 2017-05-12 VITALS — BP 124/68 | HR 58 | Ht 62.0 in | Wt 126.0 lb

## 2017-05-12 DIAGNOSIS — Z23 Encounter for immunization: Secondary | ICD-10-CM

## 2017-05-12 DIAGNOSIS — J479 Bronchiectasis, uncomplicated: Secondary | ICD-10-CM

## 2017-05-12 DIAGNOSIS — R0600 Dyspnea, unspecified: Secondary | ICD-10-CM | POA: Diagnosis not present

## 2017-05-12 DIAGNOSIS — J4531 Mild persistent asthma with (acute) exacerbation: Secondary | ICD-10-CM | POA: Diagnosis not present

## 2017-05-12 NOTE — Addendum Note (Signed)
Addended by: Len Blalock on: 05/12/2017 02:11 PM   Modules accepted: Orders

## 2017-05-12 NOTE — Progress Notes (Signed)
Subjective:   PATIENT ID: Veronica [name] GENDER: female DOB: 03/06/1951, MRN: [id number]  Synopsis: Former patient of Dr. Ashok Cordia with bronchiectasis and asthma  HPI  Chief Complaint  Patient presents with  . Follow-up    review echo, pt c/o baseline sob, nonprod cough.     Since the last visit she is doing about the same.  She doesn't feel like she has had any infections since the last visit.  No antibiotics aor prednisone.  She has had some right ear problems.  She is still coughing some.  She is still using her hypertonic saline solution regularly and the flutter valve every day.  She says even with the salt water solution and the flutter valve she still feels congested.  She will use the inhaler and the flutter as well.  She doesn't have anyone at home to provide chest manual physiotherapy.  She says that she doesn't get much out when she uses the salt water neb.  She feels like there is mucus stuck in the right side of her chest that she can't get out.  She's had symptoms like this for at least three years.    She says that she still has shortness of breath at home and has to stop when walking.   Past Medical History:  Diagnosis Date  . Asthma   . AVM (arteriovenous malformation)   . Bronchitis, chronic ([place])   . Colon polyp 01/04/91   hyperplastic  . COPD (chronic obstructive pulmonary disease) ([place])   . CVA (cerebral infarction)    following brain surgery  . Depression   . Diverticulosis of colon 02/17/06  . Endometriosis   . Gastric ulcer   . GERD (gastroesophageal reflux disease)   . Hyperlipidemia   . Hypertension   . LBP (low back pain)   . Migraine headache   . OA (osteoarthritis)   . PONV (postoperative nausea and vomiting)    slight nausea  . Seizure disorder ([place])    entered twice   . Seizure disorder ([place])   . Seizures ([place])   . Shortness of breath dyspnea   . Sjogren's disease ([place]) 2010   per Dr. Owens Shark, [place]  . Stroke [facility])    right side weakness    . Thyroid nodule   . Vitamin B 12 deficiency   . Vitamin D deficiency       Review of Systems  Constitutional: Negative for chills, diaphoresis and malaise/fatigue.  HENT: Negative for congestion and sinus pain.   Respiratory: Positive for cough, sputum production and shortness of breath. Negative for stridor.   Cardiovascular: Positive for leg swelling. Negative for chest pain and orthopnea.      Objective:  Physical Exam   Vitals:   05/12/17 1342  BP: 124/68  Pulse: (!) 58  SpO2: 100%  Weight: 126 lb (57.2 kg)  Height: 5\' 2"  (1.575 m)    Gen: well appearing HENT: OP clear, TM's clear, neck supple PULM: Few wheezes bases B, normal percussion CV: RRR, no mgr, trace edema GI: BS+, soft, nontender Derm: no cyanosis or rash Psyche: normal mood and affect   CBC    Component Value Date/Time   WBC 10.0 05/31/2016 1607   RBC 4.75 05/31/2016 1607   HGB 14.6 05/31/2016 1607   HCT 43.0 05/31/2016 1607   PLT 365.0 05/31/2016 1607   MCV 90.6 05/31/2016 1607   MCH 29.1 08/21/2015 1450   MCHC 33.9 05/31/2016 1607   RDW 14.4 05/31/2016 1607  LYMPHSABS 2.0 05/31/2016 1607   MONOABS 1.1 (H) 05/31/2016 1607   EOSABS 0.0 05/31/2016 1607   BASOSABS 0.0 05/31/2016 1607     PFT 07/09/16: FVC 1.78 L (58%) FEV1 1.48 L (64%) FEV1/FVC 0.83 FEF 25-75 1.71 L (85%) negative bronchodilator response TLC 4.08 L (81%) RV 104% ERV 35% DLCO corrected 66%  IMAGING HRCT CHEST W/O 06/28/16 images independently reviewed showing normal pulmonary parenchyma with some mucus plugging and bronchiectasis in the lingula and right middle lobe  Echocardiogram: January 2019 transthoracic echocardiogram LVEF 55-60%, mild mitral regurgitation  LABS 05/31/16 Alpha-1 antitrypsin: MM (171)  IgG: 896 IgA: 296 IgM: 104 IgE: 45 RAST panel: D farinae 1.36 / D pternoyssinus 1.24 / Cockroach 0.4 SSA: <1.0 SSB: <1.0 CBC: 10.0/14.6/43.0/365 Eosinophil: 0   11/30/11 IgE:  23.0       Assessment  & Plan:   Dyspnea, unspecified type  Mild persistent asthma with acute exacerbation  Bronchiectasis without complication (HCC)  Discussion: Veronica [name] still has some symptoms of chest congestion shortness of breath" and irritation" in the right side of her chest nearly every day despite taking hypertonic saline and using her flutter valve.  I've reviewed the echocardiogram which did not show a cardiac cause of her symptoms.  She has had the symptoms for more than a year.  She does have some asthma symptoms as well and that she will have some variability in the severity of her chest tightness and wheezing.  However, it does not sound as if this is the consistent problem.  Based on this I think it is worth a trial with a therapy vest.  She does not have anyone at home right now to help provide manual physiotherapy.  Plan: Bronchiectasis without acute exacerbation: We will prescribe a chest physiotherapy vest Continue hypertonic saline twice a day Continue albuterol twice a day Flu shot today  Moderate persistent asthma: Continue Pulmicort twice a day Continue albuterol as needed  We will see you back in 2 months to see how you are doing on the therapy vest   Current Outpatient Medications:  .  acetaminophen-codeine (TYLENOL #3) 300-30 MG tablet, Take 1 tablet by mouth every 4 (four) hours as needed., Disp: 60 tablet, Rfl: 3 .  albuterol (PROAIR HFA) 108 (90 Base) MCG/ACT inhaler, Inhale 1-2 puffs into the lungs every 4 (four) hours as needed for wheezing or shortness of breath., Disp: 18 g, Rfl: 5 .  albuterol (PROVENTIL) (2.5 MG/3ML) 0.083% nebulizer solution, Take 3 mLs (2.5 mg total) by nebulization 2 (two) times daily., Disp: 60 mL, Rfl: 3 .  aspirin 81 MG chewable tablet, Chew by mouth daily., Disp: , Rfl:  .  budesonide (PULMICORT) 0.25 MG/2ML nebulizer solution, Take 2 mLs (0.25 mg total) by nebulization 2 (two) times daily., Disp: 120 mL, Rfl: 3 .  cariprazine (VRAYLAR) capsule, Take  1 capsule (1.5 mg total) by mouth daily., Disp: 90 capsule, Rfl: 3 .  Cholecalciferol 1000 units tablet, Take 1,000 Units by mouth daily., Disp: , Rfl:  .  clonazePAM (KLONOPIN) 1 MG tablet, 2 po qhs and 1/2 tab in addition in daytime prn, Disp: 225 tablet, Rfl: 1 .  cyanocobalamin (,VITAMIN B-12,) 1000 MCG/ML injection, Inject 1 mL (1,000 mcg total) into the muscle every 14 (fourteen) days., Disp: 6 mL, Rfl: 11 .  cyclobenzaprine (FLEXERIL) 10 MG tablet, Take 1 tablet (10 mg total) by mouth 3 (three) times daily as needed for muscle spasms., Disp: 90 tablet, Rfl: 0 .  Docusate Calcium (STOOL SOFTENER  PO), Take 100 mg by mouth as needed (for constipation). , Disp: , Rfl:  .  escitalopram (LEXAPRO) 20 MG tablet, Take 1 tablet (20 mg total) by mouth daily., Disp: 90 tablet, Rfl: 3 .  furosemide (LASIX) 20 MG tablet, Take 1 tablet (20 mg total) by mouth daily as needed., Disp: 90 tablet, Rfl: 3 .  Insulin Syringe-Needle U-100 (B-D INSULIN SYRINGE 1CC/26G) 26G X 1/2" 1 ML MISC, Inject 1 each into the skin as directed., Disp: 100 each, Rfl: 5 .  levothyroxine (SYNTHROID, LEVOTHROID) 88 MCG tablet, Take 1 tablet (88 mcg total) by mouth daily before breakfast., Disp: 90 tablet, Rfl: 3 .  magic mouthwash SOLN, Take 5 mLs by mouth 4 (four) times daily. Swish, hold and spit out---Do  Not swallow.  100 ml of dexamethasone 0.5 mg per 5 ml elixir 60 ml nystatin 100,000 Unit 100 ml of diphenhydramine 12.5 mg per 5 ml elixir, Disp: 260 mL, Rfl: 3 .  nitroGLYCERIN (NITROSTAT) 0.4 MG SL tablet, PLACE 1 TABLET UNDER THE TONGUE EVERY 5 MINUTES AS NEEDED FOR CHEST PAIN, Disp: 25 tablet, Rfl: 3 .  phenytoin (DILANTIN) 100 MG ER capsule, 200 mg every morning and 100 mg every evening, DILANTIN "brand name" only, Disp: 270 capsule, Rfl: 2 .  promethazine (PHENERGAN) 25 MG tablet, Take 0.5 tablets (12.5 mg total) by mouth every 6 (six) hours as needed., Disp: 60 tablet, Rfl: 1 .  promethazine-codeine (PHENERGAN WITH CODEINE)  6.25-10 MG/5ML syrup, Take 5 mLs by mouth every 6 (six) hours as needed for cough., Disp: 300 mL, Rfl: 1 .  Respiratory Therapy Supplies (FLUTTER) DEVI, Use after nebulization treatments, Disp: 1 each, Rfl: 0 .  sodium chloride (MURO 128) 5 % ophthalmic solution, Place 1 drop into both eyes as needed for irritation. , Disp: , Rfl:  .  sodium chloride HYPERTONIC 3 % nebulizer solution, USE 1 VIAL VIA NEBULIZER TWICE DAILY, Disp: 240 mL, Rfl: 2 .  traZODone (DESYREL) 50 MG tablet, Take 4 tablets (200 mg total) by mouth at bedtime., Disp: 360 tablet, Rfl: 3 .  verapamil (CALAN-SR) 240 MG CR tablet, Take 1 tablet (240 mg total) by mouth at bedtime., Disp: 90 tablet, Rfl: 3 .  atorvastatin (LIPITOR) 40 MG tablet, Take 1 tablet (40 mg total) by mouth daily., Disp: 30 tablet, Rfl: 11

## 2017-05-12 NOTE — Patient Instructions (Signed)
Bronchiectasis without acute exacerbation: We will prescribe a chest physiotherapy vest Continue hypertonic saline twice a day Continue albuterol twice a day  Moderate persistent asthma: Continue Pulmicort twice a day Continue albuterol as needed  We will see you back in 2 months to see how you are doing on the therapy vest

## 2017-05-13 ENCOUNTER — Telehealth: Payer: Self-pay | Admitting: *Deleted

## 2017-05-13 NOTE — Telephone Encounter (Signed)
Called patient to give results that her bone density scan shows she has osteoporosis. Nurse provided education about her diagnosis and the locations in her bones. Discussed that Dr. Alain Marion wants to treat the osteoporosis with prolia and educated regarding prolia medication. Patient stated she would like to be treated with prolia. Nurse explained that she would notify Jonelle Sidle RN who will have this approved through patient's Medicare and that once it is processed Jonelle Sidle would contact the patient. Nurse will send patient brochure education of prolia via mail.    Patient requested to have her cough medicine refilled. Nurse stated she would notify PCP as this is phenergan with codeine which is controlled. Patient verbalized understanding.

## 2017-05-14 NOTE — Telephone Encounter (Signed)
Agree. Thx 

## 2017-05-16 ENCOUNTER — Ambulatory Visit (INDEPENDENT_AMBULATORY_CARE_PROVIDER_SITE_OTHER): Payer: Medicare Other | Admitting: Psychology

## 2017-05-16 DIAGNOSIS — F331 Major depressive disorder, recurrent, moderate: Secondary | ICD-10-CM

## 2017-05-17 ENCOUNTER — Other Ambulatory Visit: Payer: Self-pay | Admitting: *Deleted

## 2017-05-17 MED ORDER — PROMETHAZINE-CODEINE 6.25-10 MG/5ML PO SYRP: 5.0000 mL | ORAL_SOLUTION | Freq: Four times a day (QID) | ORAL | 0 refills | Status: DC | PRN

## 2017-05-17 NOTE — Telephone Encounter (Signed)
Called patient to inform that PCP sent cough medicine prescription to her pharmacy.

## 2017-05-17 NOTE — Telephone Encounter (Signed)
Ok Thx 

## 2017-05-18 ENCOUNTER — Telehealth: Payer: Self-pay

## 2017-05-18 ENCOUNTER — Other Ambulatory Visit: Payer: Self-pay | Admitting: Pharmacy Technician

## 2017-05-18 NOTE — Telephone Encounter (Signed)
Patient's insurance will be verified and I will call patient to discuss summary of benefits for prolia

## 2017-05-18 NOTE — Telephone Encounter (Signed)
-----   Message from Michiel Cowboy, RN sent at 05/17/2017  4:46 PM EST ----- [place] [facility], Dr. Alain Marion and patient would like to see if she can be treated with prolia for osteoporosis. She has Medicare.

## 2017-05-18 NOTE — Patient Outreach (Signed)
[place] [facility]) Care Management  05/18/2017  Veronica [name] [birth year] [id number]  Successful patient outreach call in reference to [name] patient assistance application. HIPAA identifiers verified and verbal consent received. Patient states she still has not received the application that I mailed to her in January. I gave her the option of mailing a third application, doing a home visit, or coming by the office to complete the application. The patient is going to stop by [facility] next Tuesday while she is in town. I informed her of the required documentation that is needed to go along the application.  [name], [place] [phone]

## 2017-05-24 ENCOUNTER — Other Ambulatory Visit: Payer: Self-pay | Admitting: Pharmacy Technician

## 2017-05-24 NOTE — Patient Outreach (Signed)
[place] [facility]) Care Management  05/24/2017  Veronica [name] 1950-10-22 [id number]  Patient stopped by office to complete [employer] patient assistance application and bring required documentation. The application for Dilantin is complete and has been faxed to [place] for evaluation.  [name], [place] [phone]

## 2017-05-30 ENCOUNTER — Ambulatory Visit (INDEPENDENT_AMBULATORY_CARE_PROVIDER_SITE_OTHER): Payer: Medicare Other | Admitting: Psychology

## 2017-05-30 DIAGNOSIS — F331 Major depressive disorder, recurrent, moderate: Secondary | ICD-10-CM

## 2017-05-31 ENCOUNTER — Other Ambulatory Visit: Payer: Self-pay | Admitting: Pharmacy Technician

## 2017-05-31 NOTE — Patient Outreach (Signed)
[facility]) Care Management  05/31/2017  Veronica [name] [birth year] [id number]  Outgoing call to [place] patient assistance program in reference to application submitted for Dilantin. The patient was approved 05/25/2017 with coverage through 03/14/2018. Her patient ID number is [id number] and her medication will arrived at Dr Plotnikov's office tomorrow. The patient can place her next refill request as soon as 08/01/2017.  Successful patient outreach call to update patient on the status of her application and when it will be delivered. HIPAA identifiers verified and verbal consent received. I gave her the patient ID number, next refill date, delivery date, and where the medication will be delivered to. We also discussed the refill process for when her next refill is due.  I called Dr Plotnikov's office and spoke with Angie to inform the staff that the medication would be delivered to their office for the patient.  [name], [place] [phone]

## 2017-06-01 ENCOUNTER — Ambulatory Visit: Payer: Self-pay | Admitting: Pharmacy Technician

## 2017-06-01 ENCOUNTER — Other Ambulatory Visit: Payer: Self-pay | Admitting: Pharmacy Technician

## 2017-06-01 NOTE — Patient Outreach (Signed)
[place] [facility]) Care Management  06/01/2017  Veronica [name] Apr 23, 1950 [id number]  Incoming patient call from [name] to inform me that she has picked up her Dilantin approved through patient assistance from Dr. Judeen Hammans office. I will route a message to [employer], [place] to proceed with case closure.  [name], [place] [phone]

## 2017-06-07 ENCOUNTER — Other Ambulatory Visit: Payer: Self-pay | Admitting: Pharmacist

## 2017-06-07 ENCOUNTER — Other Ambulatory Visit: Payer: Self-pay | Admitting: Pharmacy Technician

## 2017-06-07 NOTE — Patient Outreach (Signed)
[place] [facility]) Care Management  06/07/2017  Veronica [name] 01/24/1951 [id number]  Contacted [employer] patient assistance in reference to patients Vraylar. Patient was approved on 02/19 until 03/14/18. Medication order went out to Dr. Enis Slipper office in February.  [name], [place] Management [phone]

## 2017-06-07 NOTE — Patient Outreach (Signed)
[facility] [facility]) Care Management  06/07/2017  Veronica [name] October 01, 1950 [id number]  Successful phone outreach to patient, HIPAA details verified.    Patient confirms she received her Dilantin through [employer] patient assistance program.    She confirms she did receive Vraylar through [employer] patient assistance last month.  Patient asked how she orders refills of Vraylar---discussed per [employer] patient assistance program website, refills are to be ordered by her presciber. She was encouraged to contact her prescriber when she is due for Vraylar refill and remind prescriber office she is obtaining medication via [place] Patient Assistance Program.  She reports she has contact information for [employer] patient assistance program for her prescriber to call.  Patient reports she received a letter from [employer] indicating she was approved through 03/14/18 and asked how long she was approved for Vraylar---per note from [place] earlier today, patient was approved through 03/14/18.    Reminded patient she will need to re-apply for next year and meet any manufacturer patient assistance program requirements.    Patient denies other pharmacy related needs at this time.    Plan:  Case closed.    MD notified.    Patient aware she can contact [facility] if new needs arise.    Karrie Meres, PharmD, [place] [phone]

## 2017-06-13 ENCOUNTER — Ambulatory Visit (INDEPENDENT_AMBULATORY_CARE_PROVIDER_SITE_OTHER): Payer: Medicare Other | Admitting: Psychology

## 2017-06-13 DIAGNOSIS — F331 Major depressive disorder, recurrent, moderate: Secondary | ICD-10-CM

## 2017-06-17 ENCOUNTER — Ambulatory Visit: Payer: Self-pay | Admitting: Internal Medicine

## 2017-06-21 ENCOUNTER — Encounter: Payer: Self-pay | Admitting: Internal Medicine

## 2017-06-21 ENCOUNTER — Ambulatory Visit (INDEPENDENT_AMBULATORY_CARE_PROVIDER_SITE_OTHER): Payer: Medicare Other | Admitting: Internal Medicine

## 2017-06-21 VITALS — BP 124/72 | HR 60 | Temp 98.1°F | Ht 62.0 in | Wt 132.0 lb

## 2017-06-21 DIAGNOSIS — E875 Hyperkalemia: Secondary | ICD-10-CM

## 2017-06-21 DIAGNOSIS — J4531 Mild persistent asthma with (acute) exacerbation: Secondary | ICD-10-CM | POA: Diagnosis not present

## 2017-06-21 DIAGNOSIS — L509 Urticaria, unspecified: Secondary | ICD-10-CM | POA: Diagnosis not present

## 2017-06-21 DIAGNOSIS — J449 Chronic obstructive pulmonary disease, unspecified: Secondary | ICD-10-CM

## 2017-06-21 DIAGNOSIS — E539 Vitamin B deficiency, unspecified: Secondary | ICD-10-CM

## 2017-06-21 DIAGNOSIS — M810 Age-related osteoporosis without current pathological fracture: Secondary | ICD-10-CM | POA: Insufficient documentation

## 2017-06-21 DIAGNOSIS — I10 Essential (primary) hypertension: Secondary | ICD-10-CM | POA: Diagnosis not present

## 2017-06-21 DIAGNOSIS — E559 Vitamin D deficiency, unspecified: Secondary | ICD-10-CM

## 2017-06-21 MED ORDER — ZOSTER VAC RECOMB ADJUVANTED 50 MCG/0.5ML IM SUSR: 0.5000 mL | Freq: Once | INTRAMUSCULAR | 1 refills | Status: AC

## 2017-06-21 MED ORDER — LEVOTHYROXINE SODIUM 88 MCG PO TABS: 88.0000 ug | ORAL_TABLET | Freq: Every day | ORAL | 3 refills | Status: DC

## 2017-06-21 MED ORDER — PROMETHAZINE-CODEINE 6.25-10 MG/5ML PO SYRP: 5.0000 mL | ORAL_SOLUTION | Freq: Four times a day (QID) | ORAL | 1 refills | Status: DC | PRN

## 2017-06-21 MED ORDER — DENOSUMAB 60 MG/ML ~~LOC~~ SOLN
60.0000 mg | Freq: Once | SUBCUTANEOUS | Status: AC
  Administered 2017-06-21: 60 mg via SUBCUTANEOUS

## 2017-06-21 MED ORDER — CLONAZEPAM 1 MG PO TABS: ORAL_TABLET | ORAL | 3 refills | Status: DC

## 2017-06-21 MED ORDER — ACETAMINOPHEN-CODEINE #3 300-30 MG PO TABS: 1.0000 | ORAL_TABLET | ORAL | 3 refills | Status: DC | PRN

## 2017-06-21 NOTE — Progress Notes (Signed)
Subjective:  Patient ID: Veronica [name], female    DOB: Apr 14, 1950  Age: 67 y.o. MRN: [id number]  CC: No chief complaint on file.   HPI Veronica [name] presents for chronic cough, asthma, depression, CAD f/u  Outpatient Medications Prior to Visit  Medication Sig Dispense Refill  . acetaminophen-codeine (TYLENOL #3) 300-30 MG tablet Take 1 tablet by mouth every 4 (four) hours as needed. 60 tablet 3  . albuterol (PROAIR HFA) 108 (90 Base) MCG/ACT inhaler Inhale 1-2 puffs into the lungs every 4 (four) hours as needed for wheezing or shortness of breath. 18 g 5  . albuterol (PROVENTIL) (2.5 MG/3ML) 0.083% nebulizer solution Take 3 mLs (2.5 mg total) by nebulization 2 (two) times daily. 60 mL 3  . aspirin 81 MG chewable tablet Chew by mouth daily.    . budesonide (PULMICORT) 0.25 MG/2ML nebulizer solution Take 2 mLs (0.25 mg total) by nebulization 2 (two) times daily. 120 mL 3  . cariprazine (VRAYLAR) capsule Take 1 capsule (1.5 mg total) by mouth daily. 90 capsule 3  . Cholecalciferol 1000 units tablet Take 1,000 Units by mouth daily.    . clonazePAM (KLONOPIN) 1 MG tablet 2 po qhs and 1/2 tab in addition in daytime prn 225 tablet 1  . cyanocobalamin (,VITAMIN B-12,) 1000 MCG/ML injection Inject 1 mL (1,000 mcg total) into the muscle every 14 (fourteen) days. 6 mL 11  . cyclobenzaprine (FLEXERIL) 10 MG tablet Take 1 tablet (10 mg total) by mouth 3 (three) times daily as needed for muscle spasms. 90 tablet 0  . Docusate Calcium (STOOL SOFTENER PO) Take 100 mg by mouth as needed (for constipation).     [name] escitalopram (LEXAPRO) 20 MG tablet Take 1 tablet (20 mg total) by mouth daily. 90 tablet 3  . furosemide (LASIX) 20 MG tablet Take 1 tablet (20 mg total) by mouth daily as needed. 90 tablet 3  . Insulin Syringe-Needle U-100 (B-D INSULIN SYRINGE 1CC/26G) 26G X 1/2" 1 ML MISC Inject 1 each into the skin as directed. 100 each 5  . levothyroxine (SYNTHROID, LEVOTHROID) 88 MCG tablet Take 1 tablet  (88 mcg total) by mouth daily before breakfast. 90 tablet 3  . magic mouthwash SOLN Take 5 mLs by mouth 4 (four) times daily. Swish, hold and spit out---Do  Not swallow.  100 ml of dexamethasone 0.5 mg per 5 ml elixir 60 ml nystatin 100,000 Unit 100 ml of diphenhydramine 12.5 mg per 5 ml elixir 260 mL 3  . nitroGLYCERIN (NITROSTAT) 0.4 MG SL tablet PLACE 1 TABLET UNDER THE TONGUE EVERY 5 MINUTES AS NEEDED FOR CHEST PAIN 25 tablet 3  . phenytoin (DILANTIN) 100 MG ER capsule 200 mg every morning and 100 mg every evening, DILANTIN "brand name" only 270 capsule 2  . promethazine (PHENERGAN) 25 MG tablet Take 0.5 tablets (12.5 mg total) by mouth every 6 (six) hours as needed. 60 tablet 1  . promethazine-codeine (PHENERGAN WITH CODEINE) 6.25-10 MG/5ML syrup Take 5 mLs by mouth every 6 (six) hours as needed for cough. 300 mL 0  . Respiratory Therapy Supplies (FLUTTER) DEVI Use after nebulization treatments 1 each 0  . sodium chloride (MURO 128) 5 % ophthalmic solution Place 1 drop into both eyes as needed for irritation.     . sodium chloride HYPERTONIC 3 % nebulizer solution USE 1 VIAL VIA NEBULIZER TWICE DAILY 240 mL 2  . traZODone (DESYREL) 50 MG tablet Take 4 tablets (200 mg total) by mouth at bedtime. 360 tablet  3  . verapamil (CALAN-SR) 240 MG CR tablet Take 1 tablet (240 mg total) by mouth at bedtime. 90 tablet 3  . atorvastatin (LIPITOR) 40 MG tablet Take 1 tablet (40 mg total) by mouth daily. 30 tablet 11   No facility-administered medications prior to visit.     ROS Review of Systems  Constitutional: Positive for fatigue. Negative for activity change, appetite change, chills and unexpected weight change.  HENT: Positive for congestion. Negative for mouth sores and sinus pressure.   Eyes: Negative for visual disturbance.  Respiratory: Positive for cough and wheezing. Negative for chest tightness.   Gastrointestinal: Negative for abdominal pain and nausea.  Genitourinary: Negative for  difficulty urinating, frequency and vaginal pain.  Musculoskeletal: Positive for arthralgias, back pain and gait problem.  Skin: Negative for pallor and rash.  Neurological: Positive for weakness. Negative for dizziness, tremors, numbness and headaches.  Psychiatric/Behavioral: Negative for confusion and sleep disturbance.    Objective:  BP 124/72 (BP Location: Left Arm, Patient Position: Sitting, Cuff Size: Normal)   Pulse 60   Temp 98.1 F (36.7 C) (Oral)   Ht 5\' 2"  (1.575 m)   Wt 132 lb (59.9 kg)   SpO2 98%   BMI 24.14 kg/m   BP Readings from Last 3 Encounters:  06/21/17 124/72  05/12/17 124/68  04/07/17 128/72    Wt Readings from Last 3 Encounters:  06/21/17 132 lb (59.9 kg)  05/12/17 126 lb (57.2 kg)  04/07/17 128 lb (58.1 kg)    Physical Exam  Constitutional: She is oriented to person, place, and time. She appears well-developed. No distress.  HENT:  Head: Normocephalic.  Right Ear: External ear normal.  Left Ear: External ear normal.  Nose: Nose normal.  Mouth/Throat: Oropharynx is clear and moist.  Eyes: Pupils are equal, round, and reactive to light. Conjunctivae are normal. Right eye exhibits no discharge. Left eye exhibits no discharge.  Neck: Normal range of motion. Neck supple. No JVD present. No tracheal deviation present. No thyromegaly present.  Cardiovascular: Normal rate, regular rhythm and normal heart sounds.  Pulmonary/Chest: No stridor. No respiratory distress. She has wheezes.  Abdominal: Soft. Bowel sounds are normal. She exhibits no distension and no mass. There is no tenderness. There is no rebound and no guarding.  Musculoskeletal: She exhibits no edema or tenderness.  Lymphadenopathy:    She has no cervical adenopathy.  Neurological: She is alert and oriented to person, place, and time. She displays normal reflexes. No cranial nerve deficit. She exhibits normal muscle tone. Coordination abnormal.  cane  Skin: No rash noted. No erythema.    Psychiatric: She has a normal mood and affect. Her behavior is normal. Judgment and thought content normal.    Lab Results  Component Value Date   WBC 10.0 05/31/2016   HGB 14.6 05/31/2016   HCT 43.0 05/31/2016   PLT 365.0 05/31/2016   GLUCOSE 92 12/03/2016   CHOL 175 11/30/2016   TRIG 81 11/30/2016   HDL 63 11/30/2016   LDLDIRECT 165.0 09/26/2009   LDLCALC 96 11/30/2016   ALT 7 12/03/2016   AST 15 12/03/2016   NA 139 12/03/2016   K 4.6 12/03/2016   CL 100 12/03/2016   CREATININE 0.60 12/03/2016   BUN 7 12/03/2016   CO2 32 12/03/2016   TSH 1.59 12/03/2016   INR 1.0 ratio 11/07/2009    Dg Bone Density  Result Date: 04/27/2017 EXAM: DUAL X-RAY ABSORPTIOMETRY (DXA) FOR BONE MINERAL DENSITY IMPRESSION: Referring Physician:  Cassandria Anger  PATIENT: Name: Veronica [name], Veronica [name] Patient ID: [id number] Birth Date: 1950/06/07 Height: 62.5 in. Sex: Female Measured: 04/27/2017 Weight: 127.0 lbs. Indications: Caucasian, Desyrel, Dilantin, Estrogen Deficient, Family History of Osteoporosis, Height Loss (781.91), High risk medication use, Hypothyroid, Klonopin, Lexapro, Postmenopausal, scoliosis, Secondary Osteoporosis, Synthroid Fractures: Left wrist Treatments: Calcium (E943.0), Multivitamin, Vitamin D (E933.5) ASSESSMENT: The BMD measured at Femur Total Right is 0.649 g/cm2 with a T-score of -2.8. This patient is considered OSTEOPOROTIC according to [facility]) criteria. L3 was excluded due to degenerative changes. Patient does not meet criteria for FRAX assessment. Site Region Measured Date Measured Age YA T-score BMD Significant CHANGE DualFemur Total Right 04/27/2017    67.0         -2.8    0.649 g/cm2 AP Spine  L1-L4 (L3)  3/27/2017    [age]         -2.1    0.931 g/cm2 World Health Organization [facility]) criteria for post-menopausal, Caucasian Women: Normal       T-score at or above -1 SD Osteopenia   T-score between -1 and -2.5 SD Osteoporosis T-score at or below -2.5 SD  RECOMMENDATION: [facility] recommends that FDA-approved medical therapies be considered in postmenopausal women and men age 69 or older with a: 1. Hip or vertebral (clinical or morphometric) fracture. 2. T-score of <-2.5 at the spine or hip. 3. Ten-year fracture probability by FRAX of 3% or greater for hip fracture or 20% or greater for major osteoporotic fracture. All treatment decisions require clinical judgment and consideration of individual patient factors, including patient preferences, co-morbidities, previous drug use, risk factors not captured in the FRAX model (e.g. falls, vitamin D deficiency, increased bone turnover, interval significant decline in bone density) and possible under - or over-estimation of fracture risk by FRAX. All patients should ensure an adequate intake of dietary calcium (1200 mg/d) and vitamin D (800 IU daily) unless contraindicated. FOLLOW-UP: People with diagnosed cases of osteoporosis or at high risk for fracture should have regular bone mineral density tests. For patients eligible for Medicare, routine testing is allowed once every 2 years. The testing frequency can be increased to one year for patients who have rapidly progressing disease, those who are receiving or discontinuing medical therapy to restore bone mass, or have additional risk factors. I have reviewed this report, and agree with the above findings. Mark A. Thornton Papas, M.D. [facility] Radiology Electronically Signed   By: Lavonia Dana M.D.   On: 04/27/2017 14:55    Assessment & Plan:   Diagnoses and all orders for this visit:  Osteoporosis, unspecified osteoporosis type, unspecified pathological fracture presence -     denosumab (PROLIA) injection 60 mg  URTICARIA  Chronic obstructive airway disease with asthma ([place])  HYPERKALEMIA  Vitamin B-complex deficiency  Vitamin D deficiency  Essential hypertension  Mild persistent asthma with acute exacerbation   I am having Ted D.  Brester maintain her Docusate Calcium (STOOL SOFTENER PO), Cholecalciferol, sodium chloride, promethazine, magic mouthwash, FLUTTER, albuterol, budesonide, atorvastatin, cyclobenzaprine, traZODone, phenytoin, verapamil, aspirin, escitalopram, nitroGLYCERIN, furosemide, sodium chloride HYPERTONIC, levothyroxine, clonazePAM, cyanocobalamin, Insulin Syringe-Needle U-100, acetaminophen-codeine, albuterol, cariprazine, and promethazine-codeine. We administered denosumab.  Meds ordered this encounter  Medications  . denosumab (PROLIA) injection 60 mg     Follow-up: No follow-ups on file.  Walker Kehr, MD

## 2017-06-21 NOTE — Assessment & Plan Note (Signed)
Calan SR 

## 2017-06-21 NOTE — Assessment & Plan Note (Signed)
s/p thyroidectomy by Dr Harlow Asa Levothroid 88 mcg

## 2017-06-21 NOTE — Assessment & Plan Note (Signed)
On Lexapro Vraylar low dose 

## 2017-06-21 NOTE — Assessment & Plan Note (Signed)
  Pulmicort, Combivent Prom-cod syr  Potential benefits of a long term opioids use as well as potential risks (i.e. addiction risk, apnea etc) and complications (i.e. Somnolence, constipation and others) were explained to the patient and were aknowledged.

## 2017-06-21 NOTE — Assessment & Plan Note (Signed)
On B12 

## 2017-06-21 NOTE — Assessment & Plan Note (Signed)
Prolia today 

## 2017-06-27 ENCOUNTER — Ambulatory Visit (INDEPENDENT_AMBULATORY_CARE_PROVIDER_SITE_OTHER): Payer: Medicare Other | Admitting: Psychology

## 2017-06-27 DIAGNOSIS — F331 Major depressive disorder, recurrent, moderate: Secondary | ICD-10-CM

## 2017-07-11 ENCOUNTER — Ambulatory Visit (INDEPENDENT_AMBULATORY_CARE_PROVIDER_SITE_OTHER): Payer: Medicare Other | Admitting: Psychology

## 2017-07-11 DIAGNOSIS — F331 Major depressive disorder, recurrent, moderate: Secondary | ICD-10-CM

## 2017-07-20 ENCOUNTER — Ambulatory Visit (INDEPENDENT_AMBULATORY_CARE_PROVIDER_SITE_OTHER): Payer: Medicare Other | Admitting: Pulmonary Disease

## 2017-07-20 ENCOUNTER — Encounter: Payer: Self-pay | Admitting: Pulmonary Disease

## 2017-07-20 VITALS — BP 132/78 | HR 60 | Ht 62.0 in | Wt 132.2 lb

## 2017-07-20 DIAGNOSIS — J4531 Mild persistent asthma with (acute) exacerbation: Secondary | ICD-10-CM | POA: Diagnosis not present

## 2017-07-20 DIAGNOSIS — J479 Bronchiectasis, uncomplicated: Secondary | ICD-10-CM

## 2017-07-20 NOTE — Progress Notes (Signed)
Subjective:   PATIENT ID: Veronica [name] GENDER: female DOB: 1950-10-19, MRN: [id number]  Synopsis: Former patient of Dr. Ashok Cordia with bronchiectasis and asthma.  Started using a therapy vest in 04/2017.    HPI  Chief Complaint  Patient presents with  . Follow-up    2 month ROV    Veronica [name] says that she "loves" the therapy vest we got her for her bronchiectasis.  She has been using it regularly.  She has been able to produce more thick "rope like" mucus since. She has been compliant with all of her inhaled therapies (pulmiocrt and hypertonic saline).   She still feels some dyspnea. She frequently stops to rest while walking.  She had an abscessed tooth but no recent bronchitis flares.    Past Medical History:  Diagnosis Date  . Asthma   . AVM (arteriovenous malformation)   . Bronchitis, chronic ([place])   . Colon polyp 01/04/91   hyperplastic  . COPD (chronic obstructive pulmonary disease) ([place])   . CVA (cerebral infarction)    following brain surgery  . Depression   . Diverticulosis of colon 02/17/06  . Endometriosis   . Gastric ulcer   . GERD (gastroesophageal reflux disease)   . Hyperlipidemia   . Hypertension   . LBP (low back pain)   . Migraine headache   . OA (osteoarthritis)   . PONV (postoperative nausea and vomiting)    slight nausea  . Seizure disorder ([place])    entered twice   . Seizure disorder ([place])   . Seizures ([place])   . Shortness of breath dyspnea   . Sjogren's disease ([place]) 2010   per Dr. Owens Shark, [place]  . Stroke [facility])    right side weakness  . Thyroid nodule   . Vitamin B 12 deficiency   . Vitamin D deficiency       Review of Systems  Constitutional: Negative for chills, diaphoresis and malaise/fatigue.  HENT: Negative for congestion and sinus pain.   Respiratory: Positive for cough, sputum production and shortness of breath. Negative for stridor.   Cardiovascular: Positive for leg swelling. Negative for chest pain and orthopnea.       Objective:  Physical Exam   Vitals:   07/20/17 1324  BP: 132/78  Pulse: 60  SpO2: 98%  Weight: 132 lb 3.2 oz (60 kg)  Height: 5\' 2"  (1.575 m)    Gen: well appearing HENT: OP clear, TM's clear, neck supple PULM: Upper airway wheeze (near throat), clear lungs bilaterally howeverB, normal percussion CV: RRR, no mgr, trace edema GI: BS+, soft, nontender Derm: no cyanosis or rash Psyche: normal mood and affect    CBC    Component Value Date/Time   WBC 10.0 05/31/2016 1607   RBC 4.75 05/31/2016 1607   HGB 14.6 05/31/2016 1607   HCT 43.0 05/31/2016 1607   PLT 365.0 05/31/2016 1607   MCV 90.6 05/31/2016 1607   MCH 29.1 08/21/2015 1450   MCHC 33.9 05/31/2016 1607   RDW 14.4 05/31/2016 1607   LYMPHSABS 2.0 05/31/2016 1607   MONOABS 1.1 (H) 05/31/2016 1607   EOSABS 0.0 05/31/2016 1607   BASOSABS 0.0 05/31/2016 1607     PFT 07/09/16: FVC 1.78 L (58%) FEV1 1.48 L (64%) FEV1/FVC 0.83 FEF 25-75 1.71 L (85%) negative bronchodilator response TLC 4.08 L (81%) RV 104% ERV 35% DLCO corrected 66%  IMAGING HRCT CHEST W/O 06/28/16 images independently reviewed showing normal pulmonary parenchyma with some mucus plugging and bronchiectasis in the lingula  and right middle lobe  Echocardiogram: January 2019 transthoracic echocardiogram LVEF 55-60%, mild mitral regurgitation  LABS 05/31/16 Alpha-1 antitrypsin: MM (171)  IgG: 896 IgA: 296 IgM: 104 IgE: 45 RAST panel: D farinae 1.36 / D pternoyssinus 1.24 / Cockroach 0.4 SSA: <1.0 SSB: <1.0 CBC: 10.0/14.6/43.0/365 Eosinophil: 0   11/30/11 IgE:  23.0       Assessment & Plan:   Mild persistent asthma with acute exacerbation  Bronchiectasis without complication (HCC)  Discusssion: Veronica [name] is doing a bit better since we started the therapy vest.  She still has a cough, I explained to her she will have a cough every day for the rest of her life because of her bronchiectasis.  In general I think her symptoms are  fairly well-controlled right now that she does remain symptomatic on a daily basis.  I doubt we will ever be able to get her symptom-free.  Plan: Bronchiectasis: Continue using the therapy vest twice a day I recommend you do your breathing treatments in the following order: Pulmicort Albuterol Put on the therapy vest Use the hypertonic saline nebulizer while you are using the therapy vest Perform this process twice a day Use the flutter valve on an as-needed basis  Moderate persistent asthma: Continue Pulmicort twice a day Continue albuterol as needed  We will see you back in 4 to 6 months or sooner if needed    Current Outpatient Medications:  .  acetaminophen-codeine (TYLENOL #3) 300-30 MG tablet, Take 1 tablet by mouth every 4 (four) hours as needed., Disp: 60 tablet, Rfl: 3 .  albuterol (PROAIR HFA) 108 (90 Base) MCG/ACT inhaler, Inhale 1-2 puffs into the lungs every 4 (four) hours as needed for wheezing or shortness of breath., Disp: 18 g, Rfl: 5 .  albuterol (PROVENTIL) (2.5 MG/3ML) 0.083% nebulizer solution, Take 3 mLs (2.5 mg total) by nebulization 2 (two) times daily., Disp: 60 mL, Rfl: 3 .  aspirin 81 MG chewable tablet, Chew by mouth daily., Disp: , Rfl:  .  budesonide (PULMICORT) 0.25 MG/2ML nebulizer solution, Take 2 mLs (0.25 mg total) by nebulization 2 (two) times daily., Disp: 120 mL, Rfl: 3 .  cariprazine (VRAYLAR) capsule, Take 1 capsule (1.5 mg total) by mouth daily., Disp: 90 capsule, Rfl: 3 .  Cholecalciferol 1000 units tablet, Take 1,000 Units by mouth daily., Disp: , Rfl:  .  clonazePAM (KLONOPIN) 1 MG tablet, 2 po qhs and 1/2 tab in addition in daytime prn, Disp: 225 tablet, Rfl: 3 .  cyanocobalamin (,VITAMIN B-12,) 1000 MCG/ML injection, Inject 1 mL (1,000 mcg total) into the muscle every 14 (fourteen) days., Disp: 6 mL, Rfl: 11 .  cyclobenzaprine (FLEXERIL) 10 MG tablet, Take 1 tablet (10 mg total) by mouth 3 (three) times daily as needed for muscle spasms.,  Disp: 90 tablet, Rfl: 0 .  Docusate Calcium (STOOL SOFTENER PO), Take 100 mg by mouth as needed (for constipation). , Disp: , Rfl:  .  escitalopram (LEXAPRO) 20 MG tablet, Take 1 tablet (20 mg total) by mouth daily., Disp: 90 tablet, Rfl: 3 .  furosemide (LASIX) 20 MG tablet, Take 1 tablet (20 mg total) by mouth daily as needed., Disp: 90 tablet, Rfl: 3 .  Insulin Syringe-Needle U-100 (B-D INSULIN SYRINGE 1CC/26G) 26G X 1/2" 1 ML MISC, Inject 1 each into the skin as directed., Disp: 100 each, Rfl: 5 .  levothyroxine (SYNTHROID, LEVOTHROID) 88 MCG tablet, Take 1 tablet (88 mcg total) by mouth daily before breakfast., Disp: 90 tablet, Rfl: 3 .  magic mouthwash SOLN, Take 5 mLs by mouth 4 (four) times daily. Swish, hold and spit out---Do  Not swallow.  100 ml of dexamethasone 0.5 mg per 5 ml elixir 60 ml nystatin 100,000 Unit 100 ml of diphenhydramine 12.5 mg per 5 ml elixir, Disp: 260 mL, Rfl: 3 .  nitroGLYCERIN (NITROSTAT) 0.4 MG SL tablet, PLACE 1 TABLET UNDER THE TONGUE EVERY 5 MINUTES AS NEEDED FOR CHEST PAIN, Disp: 25 tablet, Rfl: 3 .  phenytoin (DILANTIN) 100 MG ER capsule, 200 mg every morning and 100 mg every evening, DILANTIN "brand name" only, Disp: 270 capsule, Rfl: 2 .  promethazine (PHENERGAN) 25 MG tablet, Take 0.5 tablets (12.5 mg total) by mouth every 6 (six) hours as needed., Disp: 60 tablet, Rfl: 1 .  promethazine-codeine (PHENERGAN WITH CODEINE) 6.25-10 MG/5ML syrup, Take 5 mLs by mouth every 6 (six) hours as needed for cough., Disp: 300 mL, Rfl: 1 .  Respiratory Therapy Supplies (FLUTTER) DEVI, Use after nebulization treatments, Disp: 1 each, Rfl: 0 .  sodium chloride (MURO 128) 5 % ophthalmic solution, Place 1 drop into both eyes as needed for irritation. , Disp: , Rfl:  .  sodium chloride HYPERTONIC 3 % nebulizer solution, USE 1 VIAL VIA NEBULIZER TWICE DAILY, Disp: 240 mL, Rfl: 2 .  traZODone (DESYREL) 50 MG tablet, Take 4 tablets (200 mg total) by mouth at bedtime., Disp: 360  tablet, Rfl: 3 .  verapamil (CALAN-SR) 240 MG CR tablet, Take 1 tablet (240 mg total) by mouth at bedtime., Disp: 90 tablet, Rfl: 3 .  atorvastatin (LIPITOR) 40 MG tablet, Take 1 tablet (40 mg total) by mouth daily., Disp: 30 tablet, Rfl: 11

## 2017-07-20 NOTE — Patient Instructions (Signed)
Bronchiectasis: Continue using the therapy vest twice a day I recommend you do your breathing treatments in the following order: Pulmicort Albuterol Put on the therapy vest Use the hypertonic saline nebulizer while you are using the therapy vest Perform this process twice a day Use the flutter valve on an as-needed basis  Moderate persistent asthma: Continue Pulmicort twice a day Continue albuterol as needed  We will see you back in 4 to 6 months or sooner if needed

## 2017-08-02 ENCOUNTER — Encounter

## 2017-08-02 ENCOUNTER — Encounter: Payer: Self-pay | Admitting: Cardiovascular Disease

## 2017-08-02 ENCOUNTER — Ambulatory Visit (INDEPENDENT_AMBULATORY_CARE_PROVIDER_SITE_OTHER): Payer: Medicare Other | Admitting: Cardiovascular Disease

## 2017-08-02 VITALS — BP 177/84 | HR 59 | Ht 61.0 in | Wt 131.0 lb

## 2017-08-02 DIAGNOSIS — K224 Dyskinesia of esophagus: Secondary | ICD-10-CM

## 2017-08-02 DIAGNOSIS — I700 Atherosclerosis of aorta: Secondary | ICD-10-CM

## 2017-08-02 DIAGNOSIS — E7800 Pure hypercholesterolemia, unspecified: Secondary | ICD-10-CM | POA: Diagnosis not present

## 2017-08-02 DIAGNOSIS — I10 Essential (primary) hypertension: Secondary | ICD-10-CM

## 2017-08-02 DIAGNOSIS — I2510 Atherosclerotic heart disease of native coronary artery without angina pectoris: Secondary | ICD-10-CM | POA: Diagnosis not present

## 2017-08-02 MED ORDER — HYDROCHLOROTHIAZIDE 12.5 MG PO CAPS: 12.5000 mg | ORAL_CAPSULE | ORAL | 3 refills | Status: DC
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
## 2017-08-02 NOTE — Patient Instructions (Signed)
Dr Sallyanne Kuster has recommended making the following medication changes: 1. START Hydrochlorothiazide 12.5 mg - take 1 tablet 3 days per week - Mondays, Wednesdays, and Fridays  Your physician has requested that you regularly monitor your blood pressure at home. Please use the same machine to check your blood pressure daily. Keep a record of your blood pressures using the log sheet provided. In 1 month, please report your readings back to Dr C. You may use our online patient portal 'MyChart' or you can call the office to speak with a nurse.  Dr Sallyanne Kuster recommends that you schedule a follow-up appointment in 12 months. You will receive a reminder letter in the mail two months in advance. If you don't receive a letter, please call our office to schedule the follow-up appointment.  If you need a refill on your cardiac medications before your next appointment, please call your pharmacy.

## 2017-08-09 ENCOUNTER — Ambulatory Visit (INDEPENDENT_AMBULATORY_CARE_PROVIDER_SITE_OTHER): Payer: Medicare Other | Admitting: Psychology

## 2017-08-09 DIAGNOSIS — F331 Major depressive disorder, recurrent, moderate: Secondary | ICD-10-CM

## 2017-08-22 ENCOUNTER — Ambulatory Visit (INDEPENDENT_AMBULATORY_CARE_PROVIDER_SITE_OTHER): Payer: Medicare Other | Admitting: Psychology

## 2017-08-22 DIAGNOSIS — F331 Major depressive disorder, recurrent, moderate: Secondary | ICD-10-CM | POA: Diagnosis not present

## 2017-08-26 ENCOUNTER — Other Ambulatory Visit: Payer: Self-pay | Admitting: Cardiovascular Disease

## 2017-09-05 ENCOUNTER — Ambulatory Visit (INDEPENDENT_AMBULATORY_CARE_PROVIDER_SITE_OTHER): Payer: Medicare Other | Admitting: Psychology

## 2017-09-05 DIAGNOSIS — F331 Major depressive disorder, recurrent, moderate: Secondary | ICD-10-CM | POA: Diagnosis not present

## 2017-09-12 ENCOUNTER — Other Ambulatory Visit: Payer: Self-pay | Admitting: Pulmonary Disease

## 2017-09-19 ENCOUNTER — Ambulatory Visit (INDEPENDENT_AMBULATORY_CARE_PROVIDER_SITE_OTHER): Payer: Medicare Other | Admitting: Psychology

## 2017-09-19 DIAGNOSIS — F331 Major depressive disorder, recurrent, moderate: Secondary | ICD-10-CM | POA: Diagnosis not present

## 2017-09-21 ENCOUNTER — Other Ambulatory Visit: Payer: Self-pay | Admitting: Internal Medicine

## 2017-09-21 ENCOUNTER — Ambulatory Visit (INDEPENDENT_AMBULATORY_CARE_PROVIDER_SITE_OTHER): Payer: Medicare Other | Admitting: Internal Medicine

## 2017-09-21 ENCOUNTER — Encounter: Payer: Self-pay | Admitting: Internal Medicine

## 2017-09-21 ENCOUNTER — Other Ambulatory Visit (INDEPENDENT_AMBULATORY_CARE_PROVIDER_SITE_OTHER): Payer: Medicare Other

## 2017-09-21 DIAGNOSIS — G4709 Other insomnia: Secondary | ICD-10-CM

## 2017-09-21 DIAGNOSIS — K219 Gastro-esophageal reflux disease without esophagitis: Secondary | ICD-10-CM

## 2017-09-21 DIAGNOSIS — F419 Anxiety disorder, unspecified: Secondary | ICD-10-CM

## 2017-09-21 DIAGNOSIS — I10 Essential (primary) hypertension: Secondary | ICD-10-CM

## 2017-09-21 DIAGNOSIS — E875 Hyperkalemia: Secondary | ICD-10-CM | POA: Diagnosis not present

## 2017-09-21 DIAGNOSIS — L509 Urticaria, unspecified: Secondary | ICD-10-CM

## 2017-09-21 DIAGNOSIS — E539 Vitamin B deficiency, unspecified: Secondary | ICD-10-CM

## 2017-09-21 DIAGNOSIS — E538 Deficiency of other specified B group vitamins: Secondary | ICD-10-CM

## 2017-09-21 DIAGNOSIS — J449 Chronic obstructive pulmonary disease, unspecified: Secondary | ICD-10-CM

## 2017-09-21 DIAGNOSIS — I2510 Atherosclerotic heart disease of native coronary artery without angina pectoris: Secondary | ICD-10-CM

## 2017-09-21 DIAGNOSIS — E559 Vitamin D deficiency, unspecified: Secondary | ICD-10-CM

## 2017-09-21 DIAGNOSIS — G8929 Other chronic pain: Secondary | ICD-10-CM

## 2017-09-21 DIAGNOSIS — M545 Low back pain: Secondary | ICD-10-CM

## 2017-09-21 DIAGNOSIS — J42 Unspecified chronic bronchitis: Secondary | ICD-10-CM | POA: Diagnosis not present

## 2017-09-21 LAB — CBC WITH DIFFERENTIAL/PLATELET
BASOS ABS: 0 10*3/uL (ref 0.0–0.1)
Basophils Relative: 0.2 % (ref 0.0–3.0)
EOS ABS: 0.1 10*3/uL (ref 0.0–0.7)
Eosinophils Relative: 1.4 % (ref 0.0–5.0)
HCT: 41.2 % (ref 36.0–46.0)
Hemoglobin: 13.9 g/dL (ref 12.0–15.0)
LYMPHS ABS: 2.5 10*3/uL (ref 0.7–4.0)
Lymphocytes Relative: 25.3 % (ref 12.0–46.0)
MCHC: 33.6 g/dL (ref 30.0–36.0)
MCV: 91.2 fl (ref 78.0–100.0)
MONO ABS: 1.3 10*3/uL — AB (ref 0.1–1.0)
Monocytes Relative: 12.7 % — ABNORMAL HIGH (ref 3.0–12.0)
NEUTROS ABS: 6.1 10*3/uL (ref 1.4–7.7)
NEUTROS PCT: 60.4 % (ref 43.0–77.0)
PLATELETS: 352 10*3/uL (ref 150.0–400.0)
RBC: 4.52 Mil/uL (ref 3.87–5.11)
RDW: 15.2 % (ref 11.5–15.5)
WBC: 10.1 10*3/uL (ref 4.0–10.5)

## 2017-09-21 LAB — HEPATIC FUNCTION PANEL
ALK PHOS: 111 U/L (ref 39–117)
ALT: 11 U/L (ref 0–35)
AST: 14 U/L (ref 0–37)
Albumin: 4.2 g/dL (ref 3.5–5.2)
BILIRUBIN DIRECT: 0.1 mg/dL (ref 0.0–0.3)
BILIRUBIN TOTAL: 0.3 mg/dL (ref 0.2–1.2)
Total Protein: 7.1 g/dL (ref 6.0–8.3)

## 2017-09-21 LAB — LIPID PANEL
CHOL/HDL RATIO: 4
Cholesterol: 239 mg/dL — ABNORMAL HIGH (ref 0–200)
HDL: 62 mg/dL (ref >39.00)
LDL CALC: 154 mg/dL — AB (ref 0–99)
NONHDL: 177.43
TRIGLYCERIDES: 116 mg/dL (ref 0.0–149.0)
VLDL: 23.2 mg/dL (ref 0.0–40.0)

## 2017-09-21 LAB — BASIC METABOLIC PANEL
BUN: 7 mg/dL (ref 6–23)
CHLORIDE: 100 meq/L (ref 96–112)
CO2: 34 mEq/L — ABNORMAL HIGH (ref 19–32)
CREATININE: 0.7 mg/dL (ref 0.40–1.20)
Calcium: 8.9 mg/dL (ref 8.4–10.5)
GFR: 88.31 mL/min (ref >60.00)
Glucose, Bld: 95 mg/dL (ref 70–99)
POTASSIUM: 5 meq/L (ref 3.5–5.1)
SODIUM: 140 meq/L (ref 135–145)

## 2017-09-21 LAB — VITAMIN B12: Vitamin B-12: >1500 pg/mL — ABNORMAL HIGH (ref 211–911)

## 2017-09-21 LAB — VITAMIN D 25 HYDROXY (VIT D DEFICIENCY, FRACTURES): VITD: 30.09 ng/mL (ref 30.00–100.00)

## 2017-09-21 LAB — TSH: TSH: 5.59 u[IU]/mL — AB (ref 0.35–4.50)

## 2017-09-21 MED ORDER — TRAZODONE HCL 50 MG PO TABS: 200.0000 mg | ORAL_TABLET | Freq: Every day | ORAL | 3 refills | Status: DC

## 2017-09-21 MED ORDER — VERAPAMIL HCL ER 240 MG PO TBCR: 240.0000 mg | EXTENDED_RELEASE_TABLET | Freq: Every day | ORAL | 3 refills | Status: DC

## 2017-09-21 MED ORDER — CYANOCOBALAMIN 1000 MCG/ML IJ SOLN
1000.0000 ug | Freq: Once | INTRAMUSCULAR | Status: AC
  Administered 2017-09-21: 1000 ug via INTRAMUSCULAR

## 2017-09-21 MED ORDER — LEVOTHYROXINE SODIUM 88 MCG PO TABS: 88.0000 ug | ORAL_TABLET | Freq: Every day | ORAL | 3 refills | Status: DC

## 2017-09-21 MED ORDER — ACETAMINOPHEN-CODEINE #3 300-30 MG PO TABS: 1.0000 | ORAL_TABLET | ORAL | 3 refills | Status: DC | PRN

## 2017-09-21 MED ORDER — PROMETHAZINE-CODEINE 6.25-10 MG/5ML PO SYRP: 5.0000 mL | ORAL_SOLUTION | Freq: Four times a day (QID) | ORAL | 1 refills | Status: DC | PRN

## 2017-09-21 MED ORDER — CLONAZEPAM 1 MG PO TABS: ORAL_TABLET | ORAL | 3 refills | Status: DC

## 2017-09-21 MED ORDER — CYANOCOBALAMIN 1000 MCG/ML IJ SOLN: 1000.0000 ug | INTRAMUSCULAR | 3 refills | Status: DC

## 2017-09-21 NOTE — Assessment & Plan Note (Addendum)
  Proair, Pulmicort Prom-cod syr prn

## 2017-09-21 NOTE — Assessment & Plan Note (Signed)
Doing well 

## 2017-09-21 NOTE — Assessment & Plan Note (Signed)
Labs

## 2017-09-21 NOTE — Patient Instructions (Signed)
Skin bx w/me 

## 2017-09-21 NOTE — Assessment & Plan Note (Signed)
Trazadone  ?

## 2017-09-21 NOTE — Assessment & Plan Note (Signed)
Vit B12 

## 2017-09-21 NOTE — Addendum Note (Signed)
Addended by: Karren Cobble on: 09/21/2017 02:37 PM   Modules accepted: Orders

## 2017-09-21 NOTE — Assessment & Plan Note (Signed)
Veronica [name]  Labs

## 2017-09-21 NOTE — Assessment & Plan Note (Signed)
Vralar Clonazepam prn

## 2017-09-21 NOTE — Progress Notes (Signed)
Subjective:  Patient ID: Veronica [name], female    DOB: 06-Aug-1950  Age: 67 y.o. MRN: [id number]  CC: No chief complaint on file.   HPI LIDIA CLAVIJO presents for cough/COPD, depression, pan f/u C/o moles  Outpatient Medications Prior to Visit  Medication Sig Dispense Refill  . acetaminophen-codeine (TYLENOL #3) 300-30 MG tablet Take 1 tablet by mouth every 4 (four) hours as needed. 60 tablet 3  . albuterol (PROAIR HFA) 108 (90 Base) MCG/ACT inhaler Inhale 1-2 puffs into the lungs every 4 (four) hours as needed for wheezing or shortness of breath. 18 g 5  . albuterol (PROVENTIL) (2.5 MG/3ML) 0.083% nebulizer solution USE 1 VIAL IN NEBULIZER 2 TIMES DAILY. Generic: VENTOLIN 60 vial 5  . aspirin 81 MG chewable tablet Chew by mouth daily.    [name] atorvastatin (LIPITOR) 40 MG tablet TAKE 1 TABLET(40 MG) BY MOUTH DAILY 90 tablet 3  . budesonide (PULMICORT) 0.25 MG/2ML nebulizer solution USE 1 VIAL IN NEBULIZER 2 TIMES DAILY. Generic: PULMICORT 120 mL 2  . cariprazine (VRAYLAR) capsule Take 1 capsule (1.5 mg total) by mouth daily. 90 capsule 3  . Cholecalciferol 1000 units tablet Take 1,000 Units by mouth daily.    . clonazePAM (KLONOPIN) 1 MG tablet 2 po qhs and 1/2 tab in addition in daytime prn 225 tablet 3  . cyanocobalamin (,VITAMIN B-12,) 1000 MCG/ML injection Inject 1 mL (1,000 mcg total) into the muscle every 14 (fourteen) days. 6 mL 11  . cyclobenzaprine (FLEXERIL) 10 MG tablet Take 1 tablet (10 mg total) by mouth 3 (three) times daily as needed for muscle spasms. 90 tablet 0  . Docusate Calcium (STOOL SOFTENER PO) Take 100 mg by mouth as needed (for constipation).     [name] escitalopram (LEXAPRO) 20 MG tablet Take 1 tablet (20 mg total) by mouth daily. 90 tablet 3  . furosemide (LASIX) 20 MG tablet Take 1 tablet (20 mg total) by mouth daily as needed. 90 tablet 3  . hydrochlorothiazide (MICROZIDE) 12.5 MG capsule Take 1 capsule (12.5 mg total) by mouth 3 (three) times a week. Mondays,  Wednesdays, and Fridays 45 capsule 3  . Insulin Syringe-Needle U-100 (B-D INSULIN SYRINGE 1CC/26G) 26G X 1/2" 1 ML MISC Inject 1 each into the skin as directed. 100 each 5  . levothyroxine (SYNTHROID, LEVOTHROID) 88 MCG tablet Take 1 tablet (88 mcg total) by mouth daily before breakfast. 90 tablet 3  . magic mouthwash SOLN Take 5 mLs by mouth 4 (four) times daily. Swish, hold and spit out---Do  Not swallow.  100 ml of dexamethasone 0.5 mg per 5 ml elixir 60 ml nystatin 100,000 Unit 100 ml of diphenhydramine 12.5 mg per 5 ml elixir 260 mL 3  . nitroGLYCERIN (NITROSTAT) 0.4 MG SL tablet PLACE 1 TABLET UNDER THE TONGUE EVERY 5 MINUTES AS NEEDED FOR CHEST PAIN 25 tablet 3  . phenytoin (DILANTIN) 100 MG ER capsule 200 mg every morning and 100 mg every evening, DILANTIN "brand name" only 270 capsule 2  . promethazine (PHENERGAN) 25 MG tablet Take 0.5 tablets (12.5 mg total) by mouth every 6 (six) hours as needed. 60 tablet 1  . promethazine-codeine (PHENERGAN WITH CODEINE) 6.25-10 MG/5ML syrup Take 5 mLs by mouth every 6 (six) hours as needed for cough. 300 mL 1  . Respiratory Therapy Supplies (FLUTTER) DEVI Use after nebulization treatments 1 each 0  . sodium chloride (MURO 128) 5 % ophthalmic solution Place 1 drop into both eyes as needed for irritation.     [name]  sodium chloride HYPERTONIC 3 % nebulizer solution USE 1 VIAL VIA NEBULIZER TWICE DAILY 240 mL 2  . traZODone (DESYREL) 50 MG tablet Take 4 tablets (200 mg total) by mouth at bedtime. 360 tablet 3  . verapamil (CALAN-SR) 240 MG CR tablet Take 1 tablet (240 mg total) by mouth at bedtime. 90 tablet 3   No facility-administered medications prior to visit.     ROS: Review of Systems  Constitutional: Positive for fatigue. Negative for activity change, appetite change, chills and unexpected weight change.  HENT: Negative for congestion, mouth sores and sinus pressure.   Eyes: Negative for visual disturbance.  Respiratory: Positive for cough.  Negative for chest tightness.   Gastrointestinal: Negative for abdominal pain and nausea.  Genitourinary: Negative for difficulty urinating, frequency and vaginal pain.  Musculoskeletal: Positive for back pain and gait problem.  Skin: Negative for pallor and rash.  Neurological: Negative for dizziness, tremors, weakness, numbness and headaches.  Psychiatric/Behavioral: Positive for dysphoric mood and sleep disturbance. Negative for behavioral problems, confusion, decreased concentration and suicidal ideas. The patient is nervous/anxious.     Objective:  BP 140/72 (BP Location: Left Arm, Patient Position: Sitting, Cuff Size: Normal)   Pulse 62   Temp 97.8 F (36.6 C) (Oral)   Ht 5\' 1"  (1.549 m)   Wt 133 lb (60.3 kg)   SpO2 98%   BMI 25.13 kg/m   BP Readings from Last 3 Encounters:  09/21/17 140/72  08/02/17 (!) 177/84  07/20/17 132/78    Wt Readings from Last 3 Encounters:  09/21/17 133 lb (60.3 kg)  08/02/17 131 lb (59.4 kg)  07/20/17 132 lb 3.2 oz (60 kg)    Physical Exam  Constitutional: She appears well-developed. No distress.  HENT:  Head: Normocephalic.  Right Ear: External ear normal.  Left Ear: External ear normal.  Nose: Nose normal.  Mouth/Throat: Oropharynx is clear and moist.  Eyes: Pupils are equal, round, and reactive to light. Conjunctivae are normal. Right eye exhibits no discharge. Left eye exhibits no discharge.  Neck: Normal range of motion. Neck supple. No JVD present. No tracheal deviation present. No thyromegaly present.  Cardiovascular: Normal rate, regular rhythm and normal heart sounds.  Pulmonary/Chest: No stridor. No respiratory distress. She has no wheezes.  Abdominal: Soft. Bowel sounds are normal. She exhibits no distension and no mass. There is no tenderness. There is no rebound and no guarding.  Musculoskeletal: She exhibits tenderness. She exhibits no edema.  Lymphadenopathy:    She has no cervical adenopathy.  Neurological: She displays  normal reflexes. No cranial nerve deficit. She exhibits normal muscle tone. Coordination abnormal.  Skin: No rash noted. No erythema.  Psychiatric: She has a normal mood and affect. Her behavior is normal. Judgment and thought content normal.  cane Moles R arn, neck, post neck Persistent cough LS tender  Lab Results  Component Value Date   WBC 10.0 05/31/2016   HGB 14.6 05/31/2016   HCT 43.0 05/31/2016   PLT 365.0 05/31/2016   GLUCOSE 92 12/03/2016   CHOL 175 11/30/2016   TRIG 81 11/30/2016   HDL 63 11/30/2016   LDLDIRECT 165.0 09/26/2009   LDLCALC 96 11/30/2016   ALT 7 12/03/2016   AST 15 12/03/2016   NA 139 12/03/2016   K 4.6 12/03/2016   CL 100 12/03/2016   CREATININE 0.60 12/03/2016   BUN 7 12/03/2016   CO2 32 12/03/2016   TSH 1.59 12/03/2016   INR 1.0 ratio 11/07/2009    Dg Bone Density  Result Date: 04/27/2017 EXAM: DUAL X-RAY ABSORPTIOMETRY (DXA) FOR BONE MINERAL DENSITY IMPRESSION: Referring Physician:  Cassandria Anger PATIENT: Name: Vandana, Haman Patient ID: [id number] Birth Date: [birth year] Height: 62.5 in. Sex: Female Measured: 04/27/2017 Weight: 127.0 lbs. Indications: Caucasian, Desyrel, Dilantin, Estrogen Deficient, Family History of Osteoporosis, Height Loss (781.91), High risk medication use, Hypothyroid, Klonopin, Lexapro, Postmenopausal, scoliosis, Secondary Osteoporosis, Synthroid Fractures: Left wrist Treatments: Calcium (E943.0), Multivitamin, Vitamin D (E933.5) ASSESSMENT: The BMD measured at Femur Total Right is 0.649 g/cm2 with a T-score of -2.8. This patient is considered OSTEOPOROTIC according to [facility]) criteria. L3 was excluded due to degenerative changes. Patient does not meet criteria for FRAX assessment. Site Region Measured Date Measured Age YA T-score BMD Significant CHANGE DualFemur Total Right 04/27/2017    67.0         -2.8    0.649 g/cm2 AP Spine  L1-L4 (L3)  04/27/2017    67.0         -2.1    0.931 g/cm2 World  Health Organization [facility]) criteria for post-menopausal, Caucasian Women: Normal       T-score at or above -1 SD Osteopenia   T-score between -1 and -2.5 SD Osteoporosis T-score at or below -2.5 SD RECOMMENDATION: [facility] recommends that FDA-approved medical therapies be considered in postmenopausal women and men age 70 or older with a: 1. Hip or vertebral (clinical or morphometric) fracture. 2. T-score of <-2.5 at the spine or hip. 3. Ten-year fracture probability by FRAX of 3% or greater for hip fracture or 20% or greater for major osteoporotic fracture. All treatment decisions require clinical judgment and consideration of individual patient factors, including patient preferences, co-morbidities, previous drug use, risk factors not captured in the FRAX model (e.g. falls, vitamin D deficiency, increased bone turnover, interval significant decline in bone density) and possible under - or over-estimation of fracture risk by FRAX. All patients should ensure an adequate intake of dietary calcium (1200 mg/d) and vitamin D (800 IU daily) unless contraindicated. FOLLOW-UP: People with diagnosed cases of osteoporosis or at high risk for fracture should have regular bone mineral density tests. For patients eligible for Medicare, routine testing is allowed once every 2 years. The testing frequency can be increased to one year for patients who have rapidly progressing disease, those who are receiving or discontinuing medical therapy to restore bone mass, or have additional risk factors. I have reviewed this report, and agree with the above findings. Mark A. Thornton Papas, M.D. [facility] Electronically Signed   By: Lavonia Dana M.D.   On: 04/27/2017 14:55    Assessment & Plan:   There are no diagnoses linked to this encounter.   No orders of the defined types were placed in this encounter.    Follow-up: No follow-ups on file.  Walker Kehr, MD

## 2017-09-21 NOTE — Assessment & Plan Note (Signed)
Tylenol #3 prn  Potential benefits of a long term opioids use as well as potential risks (i.e. addiction risk, apnea etc) and complications (i.e. Somnolence, constipation and others) were explained to the patient and were aknowledged. Flexeril

## 2017-09-25 ENCOUNTER — Other Ambulatory Visit: Payer: Self-pay | Admitting: Cardiovascular Disease

## 2017-10-03 ENCOUNTER — Ambulatory Visit (INDEPENDENT_AMBULATORY_CARE_PROVIDER_SITE_OTHER): Payer: Medicare Other | Admitting: Psychology

## 2017-10-03 DIAGNOSIS — F331 Major depressive disorder, recurrent, moderate: Secondary | ICD-10-CM

## 2017-10-12 ENCOUNTER — Other Ambulatory Visit: Payer: Medicare Other

## 2017-10-12 ENCOUNTER — Ambulatory Visit (INDEPENDENT_AMBULATORY_CARE_PROVIDER_SITE_OTHER): Payer: Medicare Other | Admitting: Internal Medicine

## 2017-10-12 ENCOUNTER — Encounter: Payer: Self-pay | Admitting: Internal Medicine

## 2017-10-12 DIAGNOSIS — M25473 Effusion, unspecified ankle: Secondary | ICD-10-CM | POA: Diagnosis not present

## 2017-10-12 DIAGNOSIS — D3612 Benign neoplasm of peripheral nerves and autonomic nervous system, upper limb, including shoulder: Secondary | ICD-10-CM | POA: Diagnosis not present

## 2017-10-12 DIAGNOSIS — D2261 Melanocytic nevi of right upper limb, including shoulder: Secondary | ICD-10-CM | POA: Diagnosis not present

## 2017-10-12 DIAGNOSIS — D224 Melanocytic nevi of scalp and neck: Secondary | ICD-10-CM

## 2017-10-12 DIAGNOSIS — L820 Inflamed seborrheic keratosis: Secondary | ICD-10-CM | POA: Diagnosis not present

## 2017-10-12 DIAGNOSIS — D485 Neoplasm of uncertain behavior of skin: Secondary | ICD-10-CM

## 2017-10-12 MED ORDER — FUROSEMIDE 20 MG PO TABS: 20.0000 mg | ORAL_TABLET | Freq: Every day | ORAL | 3 refills | Status: DC | PRN

## 2017-10-12 NOTE — Assessment & Plan Note (Signed)
twisted R  Ankle, B edema ACE wrap R Lasix prn

## 2017-10-12 NOTE — Assessment & Plan Note (Signed)
See procedure 

## 2017-10-12 NOTE — Progress Notes (Addendum)
Subjective:  Patient ID: Veronica [name], female    DOB: 1950/07/23  Age: 67 y.o. MRN: [id number]  CC: No chief complaint on file.   HPI IZORA BENN presents for R>L ankle swelling For skin bx  Outpatient Medications Prior to Visit  Medication Sig Dispense Refill  . acetaminophen-codeine (TYLENOL #3) 300-30 MG tablet Take 1 tablet by mouth every 4 (four) hours as needed. 60 tablet 3  . albuterol (PROAIR HFA) 108 (90 Base) MCG/ACT inhaler Inhale 1-2 puffs into the lungs every 4 (four) hours as needed for wheezing or shortness of breath. 18 g 5  . albuterol (PROVENTIL) (2.5 MG/3ML) 0.083% nebulizer solution USE 1 VIAL IN NEBULIZER 2 TIMES DAILY. Generic: VENTOLIN 60 vial 5  . aspirin 81 MG chewable tablet Chew by mouth daily.    [name] atorvastatin (LIPITOR) 40 MG tablet TAKE 1 TABLET(40 MG) BY MOUTH DAILY 90 tablet 3  . budesonide (PULMICORT) 0.25 MG/2ML nebulizer solution USE 1 VIAL IN NEBULIZER 2 TIMES DAILY. Generic: PULMICORT 120 mL 2  . cariprazine (VRAYLAR) capsule Take 1 capsule (1.5 mg total) by mouth daily. 90 capsule 3  . Cholecalciferol 1000 units tablet Take 1,000 Units by mouth daily.    . clonazePAM (KLONOPIN) 1 MG tablet 2 po qhs and 1/2 tab in addition in daytime prn 225 tablet 3  . cyanocobalamin (,VITAMIN B-12,) 1000 MCG/ML injection Inject 1 mL (1,000 mcg total) into the muscle every 14 (fourteen) days. 10 mL 3  . cyclobenzaprine (FLEXERIL) 10 MG tablet Take 1 tablet (10 mg total) by mouth 3 (three) times daily as needed for muscle spasms. 90 tablet 0  . Docusate Calcium (STOOL SOFTENER PO) Take 100 mg by mouth as needed (for constipation).     [name] escitalopram (LEXAPRO) 20 MG tablet Take 1 tablet (20 mg total) by mouth daily. 90 tablet 3  . furosemide (LASIX) 20 MG tablet Take 1 tablet (20 mg total) by mouth daily as needed. 90 tablet 3  . hydrochlorothiazide (MICROZIDE) 12.5 MG capsule Take 1 capsule (12.5 mg total) by mouth 3 (three) times a week. Mondays, Wednesdays,  and Fridays 45 capsule 3  . Insulin Syringe-Needle U-100 (B-D INSULIN SYRINGE 1CC/26G) 26G X 1/2" 1 ML MISC Inject 1 each into the skin as directed. 100 each 5  . magic mouthwash SOLN Take 5 mLs by mouth 4 (four) times daily. Swish, hold and spit out---Do  Not swallow.  100 ml of dexamethasone 0.5 mg per 5 ml elixir 60 ml nystatin 100,000 Unit 100 ml of diphenhydramine 12.5 mg per 5 ml elixir 260 mL 3  . nitroGLYCERIN (NITROSTAT) 0.4 MG SL tablet PLACE 1 TABLET UNDER THE TONGUE EVERY 5 MINUTES AS NEEDED FOR CHEST PAIN 25 tablet 3  . phenytoin (DILANTIN) 100 MG ER capsule 200 mg every morning and 100 mg every evening, DILANTIN "brand name" only 270 capsule 2  . promethazine (PHENERGAN) 25 MG tablet Take 0.5 tablets (12.5 mg total) by mouth every 6 (six) hours as needed. 60 tablet 1  . promethazine-codeine (PHENERGAN WITH CODEINE) 6.25-10 MG/5ML syrup Take 5 mLs by mouth every 6 (six) hours as needed for cough. 300 mL 1  . Respiratory Therapy Supplies (FLUTTER) DEVI Use after nebulization treatments 1 each 0  . sodium chloride (MURO 128) 5 % ophthalmic solution Place 1 drop into both eyes as needed for irritation.     . sodium chloride HYPERTONIC 3 % nebulizer solution USE 1 VIAL VIA NEBULIZER TWICE DAILY 240 mL 2  .  traZODone (DESYREL) 50 MG tablet Take 4 tablets (200 mg total) by mouth at bedtime. 360 tablet 3  . verapamil (CALAN-SR) 240 MG CR tablet Take 1 tablet (240 mg total) by mouth at bedtime. 90 tablet 3   No facility-administered medications prior to visit.     ROS: Review of Systems  Constitutional: Negative for activity change, appetite change, chills, fatigue and unexpected weight change.  HENT: Negative for congestion, mouth sores and sinus pressure.   Eyes: Negative for visual disturbance.  Respiratory: Negative for cough and chest tightness.   Cardiovascular: Positive for leg swelling.  Gastrointestinal: Negative for abdominal pain and nausea.  Genitourinary: Negative for  difficulty urinating, frequency and vaginal pain.  Musculoskeletal: Positive for back pain and gait problem.  Skin: Negative for pallor and rash.  Neurological: Positive for weakness. Negative for dizziness, tremors, numbness and headaches.  Psychiatric/Behavioral: Negative for confusion and sleep disturbance.    Objective:  BP 134/76 (BP Location: Left Arm, Patient Position: Sitting, Cuff Size: Normal)   Pulse 70   Temp 97.7 F (36.5 C) (Oral)   Ht 5\' 1"  (1.549 m)   Wt 138 lb (62.6 kg)   SpO2 98%   BMI 26.07 kg/m   BP Readings from Last 3 Encounters:  10/12/17 134/76  09/21/17 140/72  08/02/17 (!) 177/84    Wt Readings from Last 3 Encounters:  10/12/17 138 lb (62.6 kg)  09/21/17 133 lb (60.3 kg)  08/02/17 131 lb (59.4 kg)    Physical Exam  Constitutional: She appears well-developed. No distress.  HENT:  Head: Normocephalic.  Right Ear: External ear normal.  Left Ear: External ear normal.  Nose: Nose normal.  Mouth/Throat: Oropharynx is clear and moist.  Eyes: Pupils are equal, round, and reactive to light. Conjunctivae are normal. Right eye exhibits no discharge. Left eye exhibits no discharge.  Neck: Normal range of motion. Neck supple. No JVD present. No tracheal deviation present. No thyromegaly present.  Cardiovascular: Normal rate, regular rhythm and normal heart sounds.  Pulmonary/Chest: No stridor. No respiratory distress. She has no wheezes.  Abdominal: Soft. Bowel sounds are normal. She exhibits no distension and no mass. There is no tenderness. There is no rebound and no guarding.  Musculoskeletal: She exhibits edema and tenderness.  Lymphadenopathy:    She has no cervical adenopathy.  Neurological: She displays normal reflexes. No cranial nerve deficit. She exhibits normal muscle tone. Coordination normal.  Skin: No rash noted. No erythema.  Psychiatric: She has a normal mood and affect. Her behavior is normal. Judgment and thought content normal.  R>>L  ankle swelling Leg ?tender in the back Moles   Procedure Note :     Procedure :  Skin biopsy   Indication:  Changing mole (s ),  Suspicious lesion(s)   Risks including unsuccessful procedure , bleeding, infection, bruising, scar, a need for another complete procedure and others were explained to the patient in detail as well as the benefits. Informed consent was obtained.  The patient was placed in a decubitus position.  Lesion #1 onL post neck     measuring 6x5    mm   Skin over lesion #1  was prepped with Betadine and alcohol  and anesthetized with 1 cc of 2% lidocaine and epinephrine, using a 25-gauge 1 inch needle.  Shave biopsy with a sterile Dermablade was carried out in the usual fashion. Hyfrecator was used to destroy the rest of the lesion potentially left behind and for hemostasis. Band-Aid was applied with antibiotic  ointment.    Lesion #2 on R post neck   measuring  6x5 mm   Skin over lesion #2  was prepped with Betadine and alcohol  and anesthetized with 1 cc of 2% lidocaine and epinephrine, using a 25-gauge 1 inch needle.  Shave biopsy with a sterile Dermablade was carried out in the usual fashion. Hyfrecator was used to destroy the rest of the lesion potentially left behind and for hemostasis. Band-Aid was applied with antibiotic ointment.  Lesion #3 on R prox post arm   measuring  6x4 mm   Skin over lesion #3  was prepped with Betadine and alcohol  and anesthetized with 1 cc of 2% lidocaine and epinephrine, using a 25-gauge 1 inch needle.  Shave biopsy with a sterile Dermablade was carried out in the usual fashion. Hyfrecator was used to destroy the rest of the lesion potentially left behind and for hemostasis. Band-Aid was applied with antibiotic ointment.   Lesion #4 on R R ant neck  measuring  6x5 mm   Skin over lesion #3  was prepped with Betadine and alcohol  and anesthetized with 1 cc of 2% lidocaine and epinephrine, using a 25-gauge 1 inch needle.  Shave biopsy  with a sterile Dermablade was carried out in the usual fashion. Hyfrecator was used to destroy the rest of the lesion potentially left behind and for hemostasis. Band-Aid was applied with antibiotic ointment.    Tolerated well. Complications none.       Lab Results  Component Value Date   WBC 10.1 09/21/2017   HGB 13.9 09/21/2017   HCT 41.2 09/21/2017   PLT 352.0 09/21/2017   GLUCOSE 95 09/21/2017   CHOL 239 (H) 09/21/2017   TRIG 116.0 09/21/2017   HDL 62.00 09/21/2017   LDLDIRECT 165.0 09/26/2009   LDLCALC 154 (H) 09/21/2017   ALT 11 09/21/2017   AST 14 09/21/2017   NA 140 09/21/2017   K 5.0 09/21/2017   CL 100 09/21/2017   CREATININE 0.70 09/21/2017   BUN 7 09/21/2017   CO2 34 (H) 09/21/2017   TSH 5.59 (H) 09/21/2017   INR 1.0 ratio 11/07/2009    Dg Bone Density  Result Date: 04/27/2017 EXAM: DUAL X-RAY ABSORPTIOMETRY (DXA) FOR BONE MINERAL DENSITY IMPRESSION: Referring Physician:  Cassandria Anger PATIENT: Name: Jeriah, Skufca Patient ID: [id number] Birth Date: [birth year] Height: 62.5 in. Sex: Female Measured: 04/27/2017 Weight: 127.0 lbs. Indications: Caucasian, Desyrel, Dilantin, Estrogen Deficient, Family History of Osteoporosis, Height Loss (781.91), High risk medication use, Hypothyroid, Klonopin, Lexapro, Postmenopausal, scoliosis, Secondary Osteoporosis, Synthroid Fractures: Left wrist Treatments: Calcium (E943.0), Multivitamin, Vitamin D (E933.5) ASSESSMENT: The BMD measured at Femur Total Right is 0.649 g/cm2 with a T-score of -2.8. This patient is considered OSTEOPOROTIC according to [facility]) criteria. L3 was excluded due to degenerative changes. Patient does not meet criteria for FRAX assessment. Site Region Measured Date Measured Age YA T-score BMD Significant CHANGE DualFemur Total Right 3/27/2017    67.0         -2.8    0.649 g/cm2 AP Spine  L1-L4 (L3)  3/27/2017    [age]         -2.1    0.931 g/cm2 World Health Organization [facility])  criteria for post-menopausal, Caucasian Women: Normal       T-score at or above -1 SD Osteopenia   T-score between -1 and -2.5 SD Osteoporosis T-score at or below -2.5 SD RECOMMENDATION: [place] recommends that FDA-approved medical therapies be considered  in postmenopausal women and men age 31 or older with a: 1. Hip or vertebral (clinical or morphometric) fracture. 2. T-score of <-2.5 at the spine or hip. 3. Ten-year fracture probability by FRAX of 3% or greater for hip fracture or 20% or greater for major osteoporotic fracture. All treatment decisions require clinical judgment and consideration of individual patient factors, including patient preferences, co-morbidities, previous drug use, risk factors not captured in the FRAX model (e.g. falls, vitamin D deficiency, increased bone turnover, interval significant decline in bone density) and possible under - or over-estimation of fracture risk by FRAX. All patients should ensure an adequate intake of dietary calcium (1200 mg/d) and vitamin D (800 IU daily) unless contraindicated. FOLLOW-UP: People with diagnosed cases of osteoporosis or at high risk for fracture should have regular bone mineral density tests. For patients eligible for Medicare, routine testing is allowed once every 2 years. The testing frequency can be increased to one year for patients who have rapidly progressing disease, those who are receiving or discontinuing medical therapy to restore bone mass, or have additional risk factors. I have reviewed this report, and agree with the above findings. Mark A. Thornton Papas, M.D. [facility] Electronically Signed   By: Lavonia Dana M.D.   On: 04/27/2017 14:55    Assessment & Plan:   There are no diagnoses linked to this encounter.   No orders of the defined types were placed in this encounter.    Follow-up: No follow-ups on file.  Walker Kehr, MD

## 2017-10-12 NOTE — Patient Instructions (Signed)
Xarelto 15 mg twice a day if we ask you to start it   Postprocedure instructions :    A Band-Aid should be  changed twice daily. You can take a shower tomorrow.  Keep the wounds clean. You can wash them with liquid soap and water. Pat dry with gauze or a Kleenex tissue before applying antibiotic ointment and a Band-Aid.   You need to report immediately  if fever, chills or any signs of infection develop.    The biopsy results should be available in 1 week.

## 2017-10-13 ENCOUNTER — Other Ambulatory Visit: Payer: Self-pay | Admitting: Internal Medicine

## 2017-10-13 DIAGNOSIS — R600 Localized edema: Secondary | ICD-10-CM

## 2017-10-13 LAB — D-DIMER, QUANTITATIVE: D-Dimer, Quant: 0.6 mcg/mL FEU — ABNORMAL HIGH (ref <0.50)

## 2017-10-14 ENCOUNTER — Telehealth: Payer: Self-pay | Admitting: Internal Medicine

## 2017-10-14 ENCOUNTER — Ambulatory Visit (HOSPITAL_COMMUNITY)
Admission: RE | Admit: 2017-10-14 | Discharge: 2017-10-14 | Disposition: A | Discharge status: Home / Self Care | Payer: Medicare Other | Source: Ambulatory Visit | Attending: Family | Admitting: Family

## 2017-10-14 DIAGNOSIS — R600 Localized edema: Secondary | ICD-10-CM | POA: Diagnosis present

## 2017-10-14 DIAGNOSIS — R7989 Other specified abnormal findings of blood chemistry: Secondary | ICD-10-CM | POA: Insufficient documentation

## 2017-10-14 NOTE — Telephone Encounter (Signed)
Pt notified of results

## 2017-10-14 NOTE — Telephone Encounter (Signed)
Copied from [place] [id number]. Topic: General - Other >> Oct 14, 2017  1:42 PM Keene Breath wrote: Reason for CRM: Caryl Pina with [facility] called to inform Dr. Alain Marion that there is no evidence of DVT on ultrasound.  CB# [phone].

## 2017-10-17 ENCOUNTER — Ambulatory Visit (INDEPENDENT_AMBULATORY_CARE_PROVIDER_SITE_OTHER): Payer: Medicare Other | Admitting: Psychology

## 2017-10-17 DIAGNOSIS — F331 Major depressive disorder, recurrent, moderate: Secondary | ICD-10-CM | POA: Diagnosis not present

## 2017-10-18 NOTE — Addendum Note (Signed)
Addended by: Cassandria Anger on: 10/18/2017 10:18 AM   Modules accepted: Level of Service

## 2017-10-26 ENCOUNTER — Ambulatory Visit (INDEPENDENT_AMBULATORY_CARE_PROVIDER_SITE_OTHER): Payer: Medicare Other | Admitting: Psychology

## 2017-10-26 DIAGNOSIS — F331 Major depressive disorder, recurrent, moderate: Secondary | ICD-10-CM

## 2017-10-31 ENCOUNTER — Ambulatory Visit: Payer: Medicare Other | Admitting: Psychology

## 2017-11-01 ENCOUNTER — Telehealth: Payer: Self-pay | Admitting: Internal Medicine

## 2017-11-01 NOTE — Telephone Encounter (Signed)
Copied from [place] [id number]. Topic: Quick Communication - Rx Refill/Question >> Nov 01, 2017 11:42 AM Oliver Pila B wrote: Pt would like to be contacted by pcp nurse today  Medication: phenytoin (DILANTIN) 100 MG ER capsule [[id number]]   Has the patient contacted their pharmacy? Yes.   (Agent: If no, request that the patient contact the pharmacy for the refill.) (Agent: If yes, when and what did the pharmacy advise?)  Preferred Pharmacy (with phone number or street name): [facility]  Agent: Please be advised that RX refills may take up to 3 business days. We ask that you follow-up with your pharmacy.

## 2017-11-01 NOTE — Telephone Encounter (Signed)
Last refill was back in June. Pls advise if ok to refill.Marland KitchenJohny [name]

## 2017-11-02 MED ORDER — PHENYTOIN SODIUM EXTENDED 100 MG PO CAPS: ORAL_CAPSULE | ORAL | 3 refills | Status: DC

## 2017-11-02 NOTE — Telephone Encounter (Signed)
Sent rx to [facility] 

## 2017-11-02 NOTE — Telephone Encounter (Addendum)
Notified pt MD ok rx and has been sent to [facility]. Pt states she get her Dilantin from [place] pt assistance program not [facility]. Called [facility] spoke w/Betty cancel rx that was sent. Called [facility] @ [phone]. Pt ID [id number] spoke w/Raven gave pt info. Order was placed order ID # [id number]. Next refill date 01/09/18. Notified pt order has been placed will be shipped to the office in the 7-10 days.Marland KitchenJohny Chess

## 2017-11-02 NOTE — Addendum Note (Signed)
Addended by: Earnstine Regal on: 11/02/2017 09:35 AM   Modules accepted: Orders

## 2017-11-02 NOTE — Telephone Encounter (Signed)
Ok x 12 mo Thx 

## 2017-11-04 ENCOUNTER — Ambulatory Visit (INDEPENDENT_AMBULATORY_CARE_PROVIDER_SITE_OTHER): Payer: Medicare Other | Admitting: Internal Medicine

## 2017-11-04 ENCOUNTER — Encounter: Payer: Self-pay | Admitting: Internal Medicine

## 2017-11-04 DIAGNOSIS — I2510 Atherosclerotic heart disease of native coronary artery without angina pectoris: Secondary | ICD-10-CM | POA: Diagnosis not present

## 2017-11-04 DIAGNOSIS — K529 Noninfective gastroenteritis and colitis, unspecified: Secondary | ICD-10-CM | POA: Diagnosis not present

## 2017-11-04 MED ORDER — ONDANSETRON HCL 4 MG PO TABS: 4.0000 mg | ORAL_TABLET | Freq: Three times a day (TID) | ORAL | 0 refills | Status: DC | PRN

## 2017-11-04 MED ORDER — ONDANSETRON HCL 4 MG/2ML IJ SOLN
4.0000 mg | Freq: Once | INTRAMUSCULAR | Status: AC
  Administered 2017-11-04: 4 mg via INTRAMUSCULAR

## 2017-11-04 MED ORDER — DIPHENOXYLATE-ATROPINE 2.5-0.025 MG PO TABS: 1.0000 | ORAL_TABLET | Freq: Four times a day (QID) | ORAL | 0 refills | Status: DC | PRN

## 2017-11-04 NOTE — Addendum Note (Signed)
Addended by: Karren Cobble on: 11/04/2017 01:52 PM   Modules accepted: Orders

## 2017-11-04 NOTE — Assessment & Plan Note (Signed)
Zofran prn Lomotil prn See diet Rest

## 2017-11-04 NOTE — Progress Notes (Signed)
Subjective:  Patient ID: Veronica [name], female    DOB: [birth year]  Age: 67 y.o. MRN: [id number]  CC: No chief complaint on file.   HPI Veronica [name] presents for n/v/d and fever since Wed. No vomiting.She saw her neighbor who made soup. The neighbor had diarrhea too. Tried Phenergan, Imodium  Outpatient Medications Prior to Visit  Medication Sig Dispense Refill  . acetaminophen-codeine (TYLENOL #3) 300-30 MG tablet Take 1 tablet by mouth every 4 (four) hours as needed. 60 tablet 3  . albuterol (PROAIR HFA) 108 (90 Base) MCG/ACT inhaler Inhale 1-2 puffs into the lungs every 4 (four) hours as needed for wheezing or shortness of breath. 18 g 5  . albuterol (PROVENTIL) (2.5 MG/3ML) 0.083% nebulizer solution USE 1 VIAL IN NEBULIZER 2 TIMES DAILY. Generic: VENTOLIN 60 vial 5  . aspirin 81 MG chewable tablet Chew by mouth daily.    [name] atorvastatin (LIPITOR) 40 MG tablet TAKE 1 TABLET(40 MG) BY MOUTH DAILY 90 tablet 3  . budesonide (PULMICORT) 0.25 MG/2ML nebulizer solution USE 1 VIAL IN NEBULIZER 2 TIMES DAILY. Generic: PULMICORT 120 mL 2  . cariprazine (VRAYLAR) capsule Take 1 capsule (1.5 mg total) by mouth daily. 90 capsule 3  . Cholecalciferol 1000 units tablet Take 1,000 Units by mouth daily.    . clonazePAM (KLONOPIN) 1 MG tablet 2 po qhs and 1/2 tab in addition in daytime prn 225 tablet 3  . cyanocobalamin (,VITAMIN B-12,) 1000 MCG/ML injection Inject 1 mL (1,000 mcg total) into the muscle every 14 (fourteen) days. 10 mL 3  . cyclobenzaprine (FLEXERIL) 10 MG tablet Take 1 tablet (10 mg total) by mouth 3 (three) times daily as needed for muscle spasms. 90 tablet 0  . Docusate Calcium (STOOL SOFTENER PO) Take 100 mg by mouth as needed (for constipation).     [name] escitalopram (LEXAPRO) 20 MG tablet Take 1 tablet (20 mg total) by mouth daily. 90 tablet 3  . furosemide (LASIX) 20 MG tablet Take 1 tablet (20 mg total) by mouth daily as needed. 90 tablet 3  . hydrochlorothiazide (MICROZIDE)  12.5 MG capsule Take 1 capsule (12.5 mg total) by mouth 3 (three) times a week. Mondays, Wednesdays, and Fridays 45 capsule 3  . Insulin Syringe-Needle U-100 (B-D INSULIN SYRINGE 1CC/26G) 26G X 1/2" 1 ML MISC Inject 1 each into the skin as directed. 100 each 5  . magic mouthwash SOLN Take 5 mLs by mouth 4 (four) times daily. Swish, hold and spit out---Do  Not swallow.  100 ml of dexamethasone 0.5 mg per 5 ml elixir 60 ml nystatin 100,000 Unit 100 ml of diphenhydramine 12.5 mg per 5 ml elixir 260 mL 3  . nitroGLYCERIN (NITROSTAT) 0.4 MG SL tablet PLACE 1 TABLET UNDER THE TONGUE EVERY 5 MINUTES AS NEEDED FOR CHEST PAIN 25 tablet 3  . phenytoin (DILANTIN) 100 MG ER capsule 200 mg every morning and 100 mg every evening, DILANTIN "brand name" only 270 capsule 3  . promethazine (PHENERGAN) 25 MG tablet Take 0.5 tablets (12.5 mg total) by mouth every 6 (six) hours as needed. 60 tablet 1  . promethazine-codeine (PHENERGAN WITH CODEINE) 6.25-10 MG/5ML syrup Take 5 mLs by mouth every 6 (six) hours as needed for cough. 300 mL 1  . Respiratory Therapy Supplies (FLUTTER) DEVI Use after nebulization treatments 1 each 0  . sodium chloride (MURO 128) 5 % ophthalmic solution Place 1 drop into both eyes as needed for irritation.     . sodium chloride HYPERTONIC  3 % nebulizer solution USE 1 VIAL VIA NEBULIZER TWICE DAILY 240 mL 2  . traZODone (DESYREL) 50 MG tablet Take 4 tablets (200 mg total) by mouth at bedtime. 360 tablet 3  . verapamil (CALAN-SR) 240 MG CR tablet Take 1 tablet (240 mg total) by mouth at bedtime. 90 tablet 3   No facility-administered medications prior to visit.     ROS: Review of Systems  Constitutional: Positive for chills. Negative for activity change, appetite change, fatigue and unexpected weight change.  HENT: Negative for congestion, mouth sores and sinus pressure.   Eyes: Negative for visual disturbance.  Respiratory: Negative for cough and chest tightness.   Gastrointestinal:  Positive for abdominal pain, diarrhea and nausea. Negative for vomiting.  Genitourinary: Negative for difficulty urinating, frequency and vaginal pain.  Musculoskeletal: Negative for back pain and gait problem.  Skin: Negative for pallor and rash.  Neurological: Negative for dizziness, tremors, weakness, numbness and headaches.  Psychiatric/Behavioral: Negative for confusion and sleep disturbance.    Objective:  BP (!) 146/72 (BP Location: Left Arm, Patient Position: Sitting, Cuff Size: Normal)   Pulse 70   Temp 98.4 F (36.9 C) (Oral)   Ht 5\' 1"  (1.549 m)   Wt 133 lb (60.3 kg)   SpO2 98%   BMI 25.13 kg/m   BP Readings from Last 3 Encounters:  11/04/17 (!) 146/72  10/12/17 134/76  09/21/17 140/72    Wt Readings from Last 3 Encounters:  11/04/17 133 lb (60.3 kg)  10/12/17 138 lb (62.6 kg)  09/21/17 133 lb (60.3 kg)    Physical Exam  Constitutional: She appears well-developed. No distress.  HENT:  Head: Normocephalic.  Right Ear: External ear normal.  Left Ear: External ear normal.  Nose: Nose normal.  Mouth/Throat: Oropharynx is clear and moist.  Eyes: Pupils are equal, round, and reactive to light. Conjunctivae are normal. Right eye exhibits no discharge. Left eye exhibits no discharge.  Neck: Normal range of motion. Neck supple. No JVD present. No tracheal deviation present. No thyromegaly present.  Cardiovascular: Normal rate, regular rhythm and normal heart sounds.  Pulmonary/Chest: No stridor. No respiratory distress. She has no wheezes.  Abdominal: Soft. Bowel sounds are normal. She exhibits no distension and no mass. There is no tenderness. There is no rebound and no guarding. No hernia.  Musculoskeletal: She exhibits no edema or tenderness.  Lymphadenopathy:    She has no cervical adenopathy.  Neurological: She displays normal reflexes. No cranial nerve deficit. She exhibits normal muscle tone. Coordination abnormal.  Skin: No rash noted. No erythema.    Psychiatric: She has a normal mood and affect. Her behavior is normal. Judgment and thought content normal.   Looks tired [employer] Results  Component Value Date   WBC 10.1 09/21/2017   HGB 13.9 09/21/2017   HCT 41.2 09/21/2017   PLT 352.0 09/21/2017   GLUCOSE 95 09/21/2017   CHOL 239 (H) 09/21/2017   TRIG 116.0 09/21/2017   HDL 62.00 09/21/2017   LDLDIRECT 165.0 09/26/2009   LDLCALC 154 (H) 09/21/2017   ALT 11 09/21/2017   AST 14 09/21/2017   NA 140 09/21/2017   K 5.0 09/21/2017   CL 100 09/21/2017   CREATININE 0.70 09/21/2017   BUN 7 09/21/2017   CO2 34 (H) 09/21/2017   TSH 5.59 (H) 09/21/2017   INR 1.0 ratio 11/07/2009    No results found.  Assessment & Plan:   There are no diagnoses linked to this encounter.   No orders  of the defined types were placed in this encounter.    Follow-up: No follow-ups on file.  Walker Kehr, MD

## 2017-11-04 NOTE — Patient Instructions (Signed)
Food Choices to Help Relieve Diarrhea, Adult  When you have diarrhea, the foods you eat and your eating habits are very important. Choosing the right foods and drinks can help:  · Relieve diarrhea.  · Replace lost fluids and nutrients.  · Prevent dehydration.    What general guidelines should I follow?  Relieving diarrhea  · Choose foods with less than 2 g or .07 oz. of fiber per serving.  · Limit fats to less than 8 tsp (38 g or 1.34 oz.) a day.  · Avoid the following:  ? Foods and beverages sweetened with high-fructose corn syrup, honey, or sugar alcohols such as xylitol, sorbitol, and mannitol.  ? Foods that contain a lot of fat or sugar.  ? Fried, greasy, or spicy foods.  ? High-fiber grains, breads, and cereals.  ? Raw fruits and vegetables.  · Eat foods that are rich in probiotics. These foods include dairy products such as yogurt and fermented milk products. They help increase healthy bacteria in the stomach and intestines (gastrointestinal tract, or GI tract).  · If you have lactose intolerance, avoid dairy products. These may make your diarrhea worse.  · Take medicine to help stop diarrhea (antidiarrheal medicine) only as told by your health care provider.  Replacing nutrients  · Eat small meals or snacks every 3–4 hours.  · Eat bland foods, such as white rice, toast, or baked potato, until your diarrhea starts to get better. Gradually reintroduce nutrient-rich foods as tolerated or as told by your health care provider. This includes:  ? Well-cooked protein foods.  ? Peeled, seeded, and soft-cooked fruits and vegetables.  ? Low-fat dairy products.  · Take vitamin and mineral supplements as told by your health care provider.  Preventing dehydration    · Start by sipping water or a special solution to prevent dehydration (oral rehydration solution, ORS). Urine that is clear or pale yellow means that you are getting enough fluid.  · Try to drink at least 8–10 cups of fluid each day to help replace lost  fluids.  · You may add other liquids in addition to water, such as clear juice or decaffeinated sports drinks, as tolerated or as told by your health care provider.  · Avoid drinks with caffeine, such as coffee, tea, or soft drinks.  · Avoid alcohol.  What foods are recommended?  The items listed may not be a complete list. Talk with your health care provider about what dietary choices are best for you.  Grains  White rice. White, French, or pita breads (fresh or toasted), including plain rolls, buns, or bagels. White pasta. Saltine, soda, or graham crackers. Pretzels. Low-fiber cereal. Cooked cereals made with water (such as cornmeal, farina, or cream cereals). Plain muffins. Matzo. Melba toast. Zwieback.  Vegetables  Potatoes (without the skin). Most well-cooked and canned vegetables without skins or seeds. Tender lettuce.  Fruits  Apple sauce. Fruits canned in juice. Cooked apricots, cherries, grapefruit, peaches, pears, or plums. Fresh bananas and cantaloupe.  Meats and other protein foods  Baked or boiled chicken. Eggs. Tofu. Fish. Seafood. Smooth nut butters. Ground or well-cooked tender beef, ham, veal, lamb, pork, or poultry.  Dairy  Plain yogurt, kefir, and unsweetened liquid yogurt. Lactose-free milk, buttermilk, skim milk, or soy milk. Low-fat or nonfat hard cheese.  Beverages  Water. Low-calorie sports drinks. Fruit juices without pulp. Strained tomato and vegetable juices. Decaffeinated teas. Sugar-free beverages not sweetened with sugar alcohols. Oral rehydration solutions, if approved by your health care   provider.  Seasoning and other foods  Bouillon, broth, or soups made from recommended foods.  What foods are not recommended?  The items listed may not be a complete list. Talk with your health care provider about what dietary choices are best for you.  Grains  Whole grain, whole wheat, bran, or rye breads, rolls, pastas, and crackers. Wild or brown rice. Whole grain or bran cereals. Barley. Oats  and oatmeal. Corn tortillas or taco shells. Granola. Popcorn.  Vegetables  Raw vegetables. Fried vegetables. Cabbage, broccoli, Brussels sprouts, artichokes, baked beans, beet greens, corn, kale, legumes, peas, sweet potatoes, and yams. Potato skins. Cooked spinach and cabbage.  Fruits  Dried fruit, including raisins and dates. Raw fruits. Stewed or dried prunes. Canned fruits with syrup.  Meat and other protein foods  Fried or fatty meats. Deli meats. Chunky nut butters. Nuts and seeds. Beans and lentils. Bacon. Hot dogs. Sausage.  Dairy  High-fat cheeses. Whole milk, chocolate milk, and beverages made with milk, such as milk shakes. Half-and-half. Cream. sour cream. Ice cream.  Beverages  Caffeinated beverages (such as coffee, tea, soda, or energy drinks). Alcoholic beverages. Fruit juices with pulp. Prune juice. Soft drinks sweetened with high-fructose corn syrup or sugar alcohols. High-calorie sports drinks.  Fats and oils  Butter. Cream sauces. Margarine. Salad oils. Plain salad dressings. Olives. Avocados. Mayonnaise.  Sweets and desserts  Sweet rolls, doughnuts, and sweet breads. Sugar-free desserts sweetened with sugar alcohols such as xylitol and sorbitol.  Seasoning and other foods  Honey. Hot sauce. Chili powder. Gravy. Cream-based or milk-based soups. Pancakes and waffles.  Summary  · When you have diarrhea, the foods you eat and your eating habits are very important.  · Make sure you get at least 8–10 cups of fluid each day, or enough to keep your urine clear or pale yellow.  · Eat bland foods and gradually reintroduce healthy, nutrient-rich foods as tolerated, or as told by your health care provider.  · Avoid high-fiber, fried, greasy, or spicy foods.  This information is not intended to replace advice given to you by your health care provider. Make sure you discuss any questions you have with your health care provider.  Document Released: 05/22/2003 Document Revised: 02/27/2016 Document  Reviewed: 02/27/2016  Elsevier Interactive Patient Education © 2018 [employer]

## 2017-11-15 ENCOUNTER — Ambulatory Visit (INDEPENDENT_AMBULATORY_CARE_PROVIDER_SITE_OTHER): Payer: Medicare Other | Admitting: Psychology

## 2017-11-15 DIAGNOSIS — F331 Major depressive disorder, recurrent, moderate: Secondary | ICD-10-CM | POA: Diagnosis not present

## 2017-11-28 ENCOUNTER — Ambulatory Visit (INDEPENDENT_AMBULATORY_CARE_PROVIDER_SITE_OTHER): Payer: Medicare Other | Admitting: Psychology

## 2017-11-28 DIAGNOSIS — F331 Major depressive disorder, recurrent, moderate: Secondary | ICD-10-CM

## 2017-12-12 ENCOUNTER — Ambulatory Visit (INDEPENDENT_AMBULATORY_CARE_PROVIDER_SITE_OTHER): Payer: Medicare Other | Admitting: Psychology

## 2017-12-12 DIAGNOSIS — F331 Major depressive disorder, recurrent, moderate: Secondary | ICD-10-CM | POA: Diagnosis not present

## 2017-12-26 ENCOUNTER — Ambulatory Visit (INDEPENDENT_AMBULATORY_CARE_PROVIDER_SITE_OTHER): Payer: Medicare Other | Admitting: Psychology

## 2017-12-26 DIAGNOSIS — F331 Major depressive disorder, recurrent, moderate: Secondary | ICD-10-CM

## 2017-12-27 ENCOUNTER — Ambulatory Visit: Payer: Self-pay | Admitting: Internal Medicine

## 2018-01-03 ENCOUNTER — Ambulatory Visit (INDEPENDENT_AMBULATORY_CARE_PROVIDER_SITE_OTHER): Payer: Medicare Other | Admitting: Internal Medicine

## 2018-01-03 ENCOUNTER — Ambulatory Visit (INDEPENDENT_AMBULATORY_CARE_PROVIDER_SITE_OTHER): Payer: Medicare Other | Admitting: Primary Care

## 2018-01-03 ENCOUNTER — Ambulatory Visit (INDEPENDENT_AMBULATORY_CARE_PROVIDER_SITE_OTHER)
Admission: RE | Admit: 2018-01-03 | Discharge: 2018-01-03 | Disposition: A | Discharge status: Home / Self Care | Payer: Medicare Other | Source: Ambulatory Visit | Attending: Primary Care | Admitting: Primary Care

## 2018-01-03 ENCOUNTER — Encounter: Payer: Self-pay | Admitting: Primary Care

## 2018-01-03 ENCOUNTER — Encounter: Payer: Self-pay | Admitting: Internal Medicine

## 2018-01-03 VITALS — BP 112/76 | HR 63 | Temp 96.9°F | Ht 61.0 in | Wt 141.4 lb

## 2018-01-03 DIAGNOSIS — L97911 Non-pressure chronic ulcer of unspecified part of right lower leg limited to breakdown of skin: Secondary | ICD-10-CM | POA: Insufficient documentation

## 2018-01-03 DIAGNOSIS — L509 Urticaria, unspecified: Secondary | ICD-10-CM

## 2018-01-03 DIAGNOSIS — E539 Vitamin B deficiency, unspecified: Secondary | ICD-10-CM

## 2018-01-03 DIAGNOSIS — I2510 Atherosclerotic heart disease of native coronary artery without angina pectoris: Secondary | ICD-10-CM | POA: Diagnosis not present

## 2018-01-03 DIAGNOSIS — J42 Unspecified chronic bronchitis: Secondary | ICD-10-CM

## 2018-01-03 DIAGNOSIS — J4489 Other specified chronic obstructive pulmonary disease: Secondary | ICD-10-CM

## 2018-01-03 DIAGNOSIS — I10 Essential (primary) hypertension: Secondary | ICD-10-CM

## 2018-01-03 DIAGNOSIS — E875 Hyperkalemia: Secondary | ICD-10-CM

## 2018-01-03 DIAGNOSIS — J470 Bronchiectasis with acute lower respiratory infection: Secondary | ICD-10-CM | POA: Diagnosis not present

## 2018-01-03 DIAGNOSIS — E559 Vitamin D deficiency, unspecified: Secondary | ICD-10-CM

## 2018-01-03 DIAGNOSIS — E538 Deficiency of other specified B group vitamins: Secondary | ICD-10-CM

## 2018-01-03 DIAGNOSIS — J449 Chronic obstructive pulmonary disease, unspecified: Secondary | ICD-10-CM | POA: Diagnosis not present

## 2018-01-03 DIAGNOSIS — M810 Age-related osteoporosis without current pathological fracture: Secondary | ICD-10-CM

## 2018-01-03 DIAGNOSIS — R635 Abnormal weight gain: Secondary | ICD-10-CM

## 2018-01-03 MED ORDER — TRAZODONE HCL 50 MG PO TABS: 200.0000 mg | ORAL_TABLET | Freq: Every day | ORAL | 3 refills | Status: DC

## 2018-01-03 MED ORDER — PROMETHAZINE-CODEINE 6.25-10 MG/5ML PO SYRP: 5.0000 mL | ORAL_SOLUTION | Freq: Four times a day (QID) | ORAL | 1 refills | Status: DC | PRN

## 2018-01-03 MED ORDER — LEVOTHYROXINE SODIUM 88 MCG PO TABS: 88.0000 ug | ORAL_TABLET | Freq: Every day | ORAL | 3 refills | Status: DC

## 2018-01-03 MED ORDER — METHYLPREDNISOLONE ACETATE 80 MG/ML IJ SUSP
80.0000 mg | Freq: Once | INTRAMUSCULAR | Status: AC
  Administered 2018-01-03: 80 mg via INTRAMUSCULAR

## 2018-01-03 MED ORDER — CETIRIZINE HCL 5 MG PO TABS: 5.0000 mg | ORAL_TABLET | Freq: Every day | ORAL | 1 refills | Status: DC

## 2018-01-03 MED ORDER — ACETAMINOPHEN-CODEINE #3 300-30 MG PO TABS: 1.0000 | ORAL_TABLET | ORAL | 3 refills | Status: DC | PRN

## 2018-01-03 NOTE — Progress Notes (Signed)
Subjective:  Patient ID: Veronica [name], female    DOB: 1951/03/10  Age: 67 y.o. MRN: [id number]  CC: No chief complaint on file.   HPI Veronica [name] presents for cough and SOB. She was seen by Pulmonary Otho Najjar, NP. Pt got a Depo shot. CXR is ordered  F/u anxiety, LBP, cough C/o wt gain  Outpatient Medications Prior to Visit  Medication Sig Dispense Refill  . acetaminophen-codeine (TYLENOL #3) 300-30 MG tablet Take 1 tablet by mouth every 4 (four) hours as needed. 60 tablet 3  . albuterol (PROAIR HFA) 108 (90 Base) MCG/ACT inhaler Inhale 1-2 puffs into the lungs every 4 (four) hours as needed for wheezing or shortness of breath. 18 g 5  . albuterol (PROVENTIL) (2.5 MG/3ML) 0.083% nebulizer solution USE 1 VIAL IN NEBULIZER 2 TIMES DAILY. Generic: VENTOLIN 60 vial 5  . aspirin 81 MG chewable tablet Chew by mouth daily.    [name] atorvastatin (LIPITOR) 40 MG tablet TAKE 1 TABLET(40 MG) BY MOUTH DAILY 90 tablet 3  . budesonide (PULMICORT) 0.25 MG/2ML nebulizer solution USE 1 VIAL IN NEBULIZER 2 TIMES DAILY. Generic: PULMICORT 120 mL 2  . cariprazine (VRAYLAR) capsule Take 1 capsule (1.5 mg total) by mouth daily. 90 capsule 3  . cetirizine (ZYRTEC) 5 MG tablet Take 1 tablet (5 mg total) by mouth daily. 30 tablet 1  . Cholecalciferol 1000 units tablet Take 1,000 Units by mouth daily.    . clonazePAM (KLONOPIN) 1 MG tablet 2 po qhs and 1/2 tab in addition in daytime prn 225 tablet 3  . cyanocobalamin (,VITAMIN B-12,) 1000 MCG/ML injection Inject 1 mL (1,000 mcg total) into the muscle every 14 (fourteen) days. 10 mL 3  . cyclobenzaprine (FLEXERIL) 10 MG tablet Take 1 tablet (10 mg total) by mouth 3 (three) times daily as needed for muscle spasms. 90 tablet 0  . diphenoxylate-atropine (LOMOTIL) 2.5-0.025 MG tablet Take 1-2 tablets by mouth 4 (four) times daily as needed for diarrhea or loose stools. 30 tablet 0  . Docusate Calcium (STOOL SOFTENER PO) Take 100 mg by mouth as needed (for  constipation).     [name] escitalopram (LEXAPRO) 20 MG tablet Take 1 tablet (20 mg total) by mouth daily. 90 tablet 3  . furosemide (LASIX) 20 MG tablet Take 1 tablet (20 mg total) by mouth daily as needed. 90 tablet 3  . hydrochlorothiazide (MICROZIDE) 12.5 MG capsule Take 1 capsule (12.5 mg total) by mouth 3 (three) times a week. Mondays, Wednesdays, and Fridays 45 capsule 3  . Insulin Syringe-Needle U-100 (B-D INSULIN SYRINGE 1CC/26G) 26G X 1/2" 1 ML MISC Inject 1 each into the skin as directed. 100 each 5  . magic mouthwash SOLN Take 5 mLs by mouth 4 (four) times daily. Swish, hold and spit out---Do  Not swallow.  100 ml of dexamethasone 0.5 mg per 5 ml elixir 60 ml nystatin 100,000 Unit 100 ml of diphenhydramine 12.5 mg per 5 ml elixir 260 mL 3  . nitroGLYCERIN (NITROSTAT) 0.4 MG SL tablet PLACE 1 TABLET UNDER THE TONGUE EVERY 5 MINUTES AS NEEDED FOR CHEST PAIN 25 tablet 3  . ondansetron (ZOFRAN) 4 MG tablet Take 1 tablet (4 mg total) by mouth every 8 (eight) hours as needed for nausea or vomiting. 21 tablet 0  . phenytoin (DILANTIN) 100 MG ER capsule 200 mg every morning and 100 mg every evening, DILANTIN "brand name" only 270 capsule 3  . promethazine (PHENERGAN) 25 MG tablet Take 0.5 tablets (12.5  mg total) by mouth every 6 (six) hours as needed. 60 tablet 1  . promethazine-codeine (PHENERGAN WITH CODEINE) 6.25-10 MG/5ML syrup Take 5 mLs by mouth every 6 (six) hours as needed for cough. 300 mL 1  . Respiratory Therapy Supplies (FLUTTER) DEVI Use after nebulization treatments 1 each 0  . sodium chloride (MURO 128) 5 % ophthalmic solution Place 1 drop into both eyes as needed for irritation.     . sodium chloride HYPERTONIC 3 % nebulizer solution USE 1 VIAL VIA NEBULIZER TWICE DAILY 240 mL 2  . traZODone (DESYREL) 50 MG tablet Take 4 tablets (200 mg total) by mouth at bedtime. 360 tablet 3  . verapamil (CALAN-SR) 240 MG CR tablet Take 1 tablet (240 mg total) by mouth at bedtime. 90 tablet 3    No facility-administered medications prior to visit.     ROS: Review of Systems  Constitutional: Positive for fatigue and unexpected weight change. Negative for activity change, appetite change and chills.  HENT: Negative for congestion, mouth sores and sinus pressure.   Eyes: Negative for visual disturbance.  Respiratory: Positive for cough and shortness of breath. Negative for chest tightness.   Cardiovascular: Positive for leg swelling.  Gastrointestinal: Negative for abdominal pain and nausea.  Genitourinary: Negative for difficulty urinating, frequency and vaginal pain.  Musculoskeletal: Negative for back pain and gait problem.  Skin: Positive for wound. Negative for pallor and rash.  Neurological: Negative for dizziness, tremors, weakness, numbness and headaches.  Psychiatric/Behavioral: Negative for confusion and sleep disturbance.    Objective:  BP 126/72 (BP Location: Left Arm, Patient Position: Sitting, Cuff Size: Normal)   Pulse 62   Temp 98.6 F (37 C) (Oral)   Ht 5\' 1"  (1.549 m)   Wt 141 lb (64 kg)   SpO2 97%   BMI 26.64 kg/m   BP Readings from Last 3 Encounters:  01/03/18 126/72  01/03/18 112/76  11/04/17 (!) 146/72    Wt Readings from Last 3 Encounters:  01/03/18 141 lb (64 kg)  01/03/18 141 lb 6.4 oz (64.1 kg)  11/04/17 133 lb (60.3 kg)    Physical Exam  Constitutional: She appears well-developed. No distress.  HENT:  Head: Normocephalic.  Right Ear: External ear normal.  Left Ear: External ear normal.  Nose: Nose normal.  Mouth/Throat: Oropharynx is clear and moist.  Eyes: Pupils are equal, round, and reactive to light. Conjunctivae are normal. Right eye exhibits no discharge. Left eye exhibits no discharge.  Neck: Normal range of motion. Neck supple. No JVD present. No tracheal deviation present. No thyromegaly present.  Cardiovascular: Normal rate, regular rhythm and normal heart sounds.  Pulmonary/Chest: No stridor. No respiratory distress.  She has no wheezes.  Abdominal: Soft. Bowel sounds are normal. She exhibits no distension and no mass. There is no tenderness. There is no rebound and no guarding.  Musculoskeletal: She exhibits tenderness. She exhibits no edema.  Lymphadenopathy:    She has no cervical adenopathy.  Neurological: She displays normal reflexes. No cranial nerve deficit. She exhibits normal muscle tone. Coordination abnormal.  Skin: No rash noted. No erythema.  Psychiatric: She has a normal mood and affect. Her behavior is normal. Judgment and thought content normal.  cane Scab R inner ankle 1 cm Trace edema B  Lab Results  Component Value Date   WBC 10.1 09/21/2017   HGB 13.9 09/21/2017   HCT 41.2 09/21/2017   PLT 352.0 09/21/2017   GLUCOSE 95 09/21/2017   CHOL 239 (H) 09/21/2017  TRIG 116.0 09/21/2017   HDL 62.00 09/21/2017   LDLDIRECT 165.0 09/26/2009   LDLCALC 154 (H) 09/21/2017   ALT 11 09/21/2017   AST 14 09/21/2017   NA 140 09/21/2017   K 5.0 09/21/2017   CL 100 09/21/2017   CREATININE 0.70 09/21/2017   BUN 7 09/21/2017   CO2 34 (H) 09/21/2017   TSH 5.59 (H) 09/21/2017   INR 1.0 ratio 11/07/2009    No results found.  Assessment & Plan:   There are no diagnoses linked to this encounter.   No orders of the defined types were placed in this encounter.    Follow-up: No follow-ups on file.  Walker Kehr, MD

## 2018-01-03 NOTE — Progress Notes (Signed)
@Patient  ID: Veronica [name], female    DOB: 1950-12-19, 67 y.o.   MRN: [id number]  Chief Complaint  Patient presents with  . Follow-up    stable but cough with thick white to yellow, wheezing, SOB with exertion, sleep elevated    Referring provider: Plotnikov, Evie Lacks, MD  HPI: 67 year old female, never smoked (passive smoke exposure). PMH asthma, pulmonary emphysema, chronic bronchitis, bronchiectasis. Patient of Dr. Lake Bells, last seen in May 2019. Doing better since started on therapy vest twice daily. Continues to have chronic cough which is not likely to go away completely. Maintained on Pulmicort BID and albuterol  prn and hypertonic saline neb treatment with therapy vest twice daily. PRN flutter valve.   01/08/2018 Patient presents today for 4 month office visit.  Complains of shortness of breath, wheezing and chest tightness for the last 4 weeks.  She is having difficulty bringing up mucus describes it as thick and string-like.  Thinks her symptoms could be related to allergies, she has not tried taking an antihistamine. She continues using Pulmicort twice daily, has required albuterol nebulizer up to 4 times a day for wheezing.  Also using hypertonic saline neb treatments with therapy vest twice daily, flutter valve and Mucinex.  Patient asking if it is okay to receive influenza vaccine, had shot last year with no adverse reaction.  Reports previous localized reaction to flu shot many years ago.   Significant testing reviewed: HRCT CHEST W/O 06/28/16 images independently reviewed showing normal pulmonary parenchyma with some mucus plugging and bronchiectasis in the lingula and right middle lobe  07/09/16: FVC 1.78 L (58%) FEV1 1.48 L (64%) FEV1/FVC 0.83 FEF 25-75 1.71 L (85%) negative bronchodilator response TLC 4.08 L (81%) RV 104% ERV 35% DLCO corrected 66%   Allergies  Allergen Reactions  . Avelox [Moxifloxacin Hcl In Nacl] Anaphylaxis, Swelling and Rash  . Cephalexin  Shortness Of Breath and Itching  . Moxifloxacin Anaphylaxis, Itching, Swelling and Rash    Avelox  . Amantadine Hcl Other (See Comments)    Unknown  . Bee Venom Itching and Swelling    Localized  . Cymbalta [Duloxetine Hcl] Nausea Only    Dizzy  . Cyproheptadine Hcl Other (See Comments)    Unknown  . Doxycycline Hyclate Other (See Comments)    High blood pressure  . Effexor [Venlafaxine Hydrochloride] Other (See Comments)    elev BP (sky high), dizzy, shaking, ER visit  . Effexor [Venlafaxine]     Restless   . Fluticasone-Salmeterol Itching  . Guaifenesin Other (See Comments)    Unknown  . Imipramine Hcl Other (See Comments)    Unknown   . Lactose Intolerance (Gi) Other (See Comments)    Per allergy testing  . Latex Itching    dermatitis  . Oxycodone-Acetaminophen Itching  . Phenytoin Other (See Comments)    Pt must DILANTIN "brand name" only   . Pirbuterol Acetate Other (See Comments)    Maxair, Unknown  . Remeron [Mirtazapine]     nightmares  . Salmeterol Xinafoate Other (See Comments)    Shaking, Serevent  . Viibryd [Vilazodone Hcl] Itching and Nausea Only    shaky  . Zolpidem Tartrate Other (See Comments)    REACTION: not effective  . Azithromycin Itching and Rash  . Ciprofloxacin Itching and Rash  . Erythromycin Base Itching and Rash  . Flagyl [Metronidazole Hcl] Itching and Rash  . Fluconazole Itching and Rash  . Fluoxetine Rash  . Lansoprazole Itching and Rash  .  Metoclopramide Hcl Itching and Rash  . Montelukast Sodium Itching and Rash  . Penicillins Itching and Rash    All cillins, Has patient had a PCN reaction causing immediate rash, facial/tongue/throat swelling, SOB or lightheadedness with hypotension: Yes Has patient had a PCN reaction causing severe rash involving mucus membranes or skin necrosis: No Has patient had a PCN reaction that required hospitalization No Has patient had a PCN reaction occurring within the last 10 years: Yes If all of the  above answers are "NO", then may proceed with Cephalosporin use.   [name] Propulsid [Cisapride] Itching and Rash  . Reglan [Metoclopramide] Itching and Rash  . Sulfadiazine Itching and Rash  . Sulfamethoxazole-Trimethoprim Itching and Rash  . Telithromycin Itching and Rash  . Tetracycline Hcl Itching and Rash  . Topiramate Itching and Rash  . Valproic Acid Rash    Immunization History  Administered Date(s) Administered  . Influenza,inj,Quad PF,6+ Mos 05/12/2017  . Pneumococcal Conjugate-13 11/13/2013  . Pneumococcal Polysaccharide-23 02/19/2004, 01/29/2009, 10/07/2015  . Td 02/11/2014    Past Medical History:  Diagnosis Date  . Asthma   . AVM (arteriovenous malformation)   . Bronchitis, chronic ([place])   . Colon polyp 01/04/91   hyperplastic  . COPD (chronic obstructive pulmonary disease) ([place])   . CVA (cerebral infarction)    following brain surgery  . Depression   . Diverticulosis of colon 02/17/06  . Endometriosis   . Gastric ulcer   . GERD (gastroesophageal reflux disease)   . Hyperlipidemia   . Hypertension   . LBP (low back pain)   . Migraine headache   . OA (osteoarthritis)   . PONV (postoperative nausea and vomiting)    slight nausea  . Seizure disorder ([place])    entered twice   . Seizure disorder ([place])   . Seizures ([place])   . Shortness of breath dyspnea   . Sjogren's disease ([place]) 2010   per Dr. Owens Shark, [place]  . Stroke [facility])    right side weakness  . Thyroid nodule   . Vitamin B 12 deficiency   . Vitamin D deficiency     Tobacco History: Social History   Tobacco Use  Smoking Status Passive Smoke Exposure - Never Smoker  Smokeless Tobacco Never Used  Tobacco Comment   Father & mutiple other family members smoked.   Counseling given: Not Answered Comment: Father & mutiple other family members smoked.   Outpatient Medications Prior to Visit  Medication Sig Dispense Refill  . albuterol (PROAIR HFA) 108 (90 Base) MCG/ACT inhaler Inhale 1-2 puffs into the  lungs every 4 (four) hours as needed for wheezing or shortness of breath. 18 g 5  . albuterol (PROVENTIL) (2.5 MG/3ML) 0.083% nebulizer solution USE 1 VIAL IN NEBULIZER 2 TIMES DAILY. Generic: VENTOLIN 60 vial 5  . aspirin 81 MG chewable tablet Chew by mouth daily.    [name] atorvastatin (LIPITOR) 40 MG tablet TAKE 1 TABLET(40 MG) BY MOUTH DAILY 90 tablet 3  . budesonide (PULMICORT) 0.25 MG/2ML nebulizer solution USE 1 VIAL IN NEBULIZER 2 TIMES DAILY. Generic: PULMICORT 120 mL 2  . cariprazine (VRAYLAR) capsule Take 1 capsule (1.5 mg total) by mouth daily. 90 capsule 3  . Cholecalciferol 1000 units tablet Take 1,000 Units by mouth daily.    . clonazePAM (KLONOPIN) 1 MG tablet 2 po qhs and 1/2 tab in addition in daytime prn 225 tablet 3  . cyanocobalamin (,VITAMIN B-12,) 1000 MCG/ML injection Inject 1 mL (1,000 mcg total) into the muscle every 14 (  fourteen) days. 10 mL 3  . cyclobenzaprine (FLEXERIL) 10 MG tablet Take 1 tablet (10 mg total) by mouth 3 (three) times daily as needed for muscle spasms. 90 tablet 0  . diphenoxylate-atropine (LOMOTIL) 2.5-0.025 MG tablet Take 1-2 tablets by mouth 4 (four) times daily as needed for diarrhea or loose stools. 30 tablet 0  . Docusate Calcium (STOOL SOFTENER PO) Take 100 mg by mouth as needed (for constipation).     [name] escitalopram (LEXAPRO) 20 MG tablet Take 1 tablet (20 mg total) by mouth daily. 90 tablet 3  . furosemide (LASIX) 20 MG tablet Take 1 tablet (20 mg total) by mouth daily as needed. 90 tablet 3  . hydrochlorothiazide (MICROZIDE) 12.5 MG capsule Take 1 capsule (12.5 mg total) by mouth 3 (three) times a week. Mondays, Wednesdays, and Fridays 45 capsule 3  . nitroGLYCERIN (NITROSTAT) 0.4 MG SL tablet PLACE 1 TABLET UNDER THE TONGUE EVERY 5 MINUTES AS NEEDED FOR CHEST PAIN 25 tablet 3  . phenytoin (DILANTIN) 100 MG ER capsule 200 mg every morning and 100 mg every evening, DILANTIN "brand name" only 270 capsule 3  . promethazine (PHENERGAN) 25 MG tablet  Take 0.5 tablets (12.5 mg total) by mouth every 6 (six) hours as needed. 60 tablet 1  . Respiratory Therapy Supplies (FLUTTER) DEVI Use after nebulization treatments 1 each 0  . sodium chloride (MURO 128) 5 % ophthalmic solution Place 1 drop into both eyes as needed for irritation.     . sodium chloride HYPERTONIC 3 % nebulizer solution USE 1 VIAL VIA NEBULIZER TWICE DAILY 240 mL 2  . verapamil (CALAN-SR) 240 MG CR tablet Take 1 tablet (240 mg total) by mouth at bedtime. 90 tablet 3  . acetaminophen-codeine (TYLENOL #3) 300-30 MG tablet Take 1 tablet by mouth every 4 (four) hours as needed. 60 tablet 3  . promethazine-codeine (PHENERGAN WITH CODEINE) 6.25-10 MG/5ML syrup Take 5 mLs by mouth every 6 (six) hours as needed for cough. 300 mL 1  . traZODone (DESYREL) 50 MG tablet Take 4 tablets (200 mg total) by mouth at bedtime. 360 tablet 3  . Insulin Syringe-Needle U-100 (B-D INSULIN SYRINGE 1CC/26G) 26G X 1/2" 1 ML MISC Inject 1 each into the skin as directed. 100 each 5  . magic mouthwash SOLN Take 5 mLs by mouth 4 (four) times daily. Swish, hold and spit out---Do  Not swallow.  100 ml of dexamethasone 0.5 mg per 5 ml elixir 60 ml nystatin 100,000 Unit 100 ml of diphenhydramine 12.5 mg per 5 ml elixir 260 mL 3  . ondansetron (ZOFRAN) 4 MG tablet Take 1 tablet (4 mg total) by mouth every 8 (eight) hours as needed for nausea or vomiting. 21 tablet 0   No facility-administered medications prior to visit.     Review of Systems  Review of Systems  Constitutional: Negative.   HENT: Positive for congestion. Negative for sinus pressure and sinus pain.   Eyes: Positive for redness and itching.  Respiratory: Positive for cough, chest tightness, shortness of breath and wheezing.   Cardiovascular: Negative.     Physical Exam  BP 112/76 (BP Location: Right Arm, Cuff Size: Normal)   Pulse 63   Temp (!) 96.9 F (36.1 C)   Ht 5\' 1"  (1.549 m)   Wt 141 lb 6.4 oz (64.1 kg)   SpO2 98%   BMI 26.72  kg/m  Physical Exam  Constitutional: She is oriented to person, place, and time. She appears well-developed and well-nourished. No distress.  HENT:  Head: Normocephalic and atraumatic.  Eyes: Pupils are equal, round, and reactive to light. EOM are normal.  Neck: Normal range of motion. Neck supple.  Cardiovascular: Normal rate and regular rhythm.  Pulmonary/Chest: Effort normal. She has wheezes.  Scattered rhonchi, anterior chest with exp wheeze   Neurological: She is alert and oriented to person, place, and time.  Skin: Skin is warm and dry.  Psychiatric: She has a normal mood and affect. Her behavior is normal. Judgment and thought content normal.     Lab Results:  CBC    Component Value Date/Time   WBC 10.1 09/21/2017 1427   RBC 4.52 09/21/2017 1427   HGB 13.9 09/21/2017 1427   HCT 41.2 09/21/2017 1427   PLT 352.0 09/21/2017 1427   MCV 91.2 09/21/2017 1427   MCH 29.1 08/21/2015 1450   MCHC 33.6 09/21/2017 1427   RDW 15.2 09/21/2017 1427   LYMPHSABS 2.5 09/21/2017 1427   MONOABS 1.3 (H) 09/21/2017 1427   EOSABS 0.1 09/21/2017 1427   BASOSABS 0.0 09/21/2017 1427    BMET    Component Value Date/Time   NA 140 09/21/2017 1427   K 5.0 09/21/2017 1427   CL 100 09/21/2017 1427   CO2 34 (H) 09/21/2017 1427   GLUCOSE 95 09/21/2017 1427   BUN 7 09/21/2017 1427   CREATININE 0.70 09/21/2017 1427   CALCIUM 8.9 09/21/2017 1427   GFRNONAA >60 08/31/2015 0254   GFRAA >60 08/31/2015 0254    BNP No results found for: BNP  ProBNP    Component Value Date/Time   PROBNP 78.0 08/01/2015 1234    Imaging: Dg Chest 2 View  Result Date: 01/03/2018 CLINICAL DATA:  Wheezing AND cough, intermittent 1.5 wks, HTN, hx of bronchiectasis. EXAM: CHEST - 2 VIEW COMPARISON:  01/28/2017 FINDINGS: Heart size is normal. There is atherosclerotic calcification of the thoracic aorta. The lungs are free of focal consolidations and pleural effusions. No pulmonary edema. Surgical clips are present  in the UPPER abdomen. Visualized osseous structures have a normal appearance. IMPRESSION: No evidence for acute cardiopulmonary abnormality. Aortic atherosclerosis. (ICD10-I70.0) Electronically Signed   By: Nolon Nations M.D.   On: 01/03/2018 16:20     Assessment & Plan:   Bronchiectasis ([place])  Acute exacerbation of bronchiectasis Start Mucinex twice day with full glass of water  Start Zyrtec (antihistamine) daily  Received depo-medrol injection today CXR 01/03/2018 showed no evidence of consolidation, pleural effusion or pulmonary edema Continue to use therapy vest with hypertonic saline neb treatments  PRN flutter valve Ok to have influenza vaccine when better  FU in 1 week    Pulmonary emphysema ([place]) Continue pulmicort twice daily     Martyn Ehrich, NP 01/08/2018

## 2018-01-03 NOTE — Assessment & Plan Note (Signed)
Worse CXR pending She was seen by Pulmonary Otho Najjar, NP. Pt got a Depo shot.

## 2018-01-03 NOTE — Patient Instructions (Signed)
Wt Readings from Last 3 Encounters:  01/03/18 141 lb (64 kg)  01/03/18 141 lb 6.4 oz (64.1 kg)  11/04/17 133 lb (60.3 kg)

## 2018-01-03 NOTE — Assessment & Plan Note (Signed)
On B12 

## 2018-01-03 NOTE — Assessment & Plan Note (Signed)
BP Readings from Last 3 Encounters:  01/03/18 126/72  01/03/18 112/76  11/04/17 (!) 146/72

## 2018-01-03 NOTE — Assessment & Plan Note (Signed)
[place]

## 2018-01-03 NOTE — Assessment & Plan Note (Signed)
Wt Readings from Last 3 Encounters:  01/03/18 141 lb (64 kg)  01/03/18 141 lb 6.4 oz (64.1 kg)  11/04/17 133 lb (60.3 kg)  Cut back on carbs

## 2018-01-03 NOTE — Patient Instructions (Addendum)
Start Zyrtec (antihistamine) daily  Mucinex twice day with full glass of water   Received depo-medrol injection today  Check CXR   Ok to have flu shot when better   FU in 1 week

## 2018-01-03 NOTE — Assessment & Plan Note (Signed)
Elevate leg Wound care

## 2018-01-08 ENCOUNTER — Encounter: Payer: Self-pay | Admitting: Primary Care

## 2018-01-08 DIAGNOSIS — J479 Bronchiectasis, uncomplicated: Secondary | ICD-10-CM | POA: Insufficient documentation

## 2018-01-08 NOTE — Assessment & Plan Note (Signed)
  Acute exacerbation of bronchiectasis Start Mucinex twice day with full glass of water  Start Zyrtec (antihistamine) daily  Received depo-medrol injection today CXR 01/03/2018 showed no evidence of consolidation, pleural effusion or pulmonary edema Continue to use therapy vest with hypertonic saline neb treatments  PRN flutter valve Ok to have influenza vaccine when better  FU in 1 week

## 2018-01-08 NOTE — Assessment & Plan Note (Signed)
Continue pulmicort twice daily

## 2018-01-09 ENCOUNTER — Ambulatory Visit (INDEPENDENT_AMBULATORY_CARE_PROVIDER_SITE_OTHER): Payer: Medicare Other | Admitting: Psychology

## 2018-01-09 DIAGNOSIS — F331 Major depressive disorder, recurrent, moderate: Secondary | ICD-10-CM

## 2018-01-10 ENCOUNTER — Telehealth: Payer: Self-pay | Admitting: Primary Care

## 2018-01-10 ENCOUNTER — Ambulatory Visit (INDEPENDENT_AMBULATORY_CARE_PROVIDER_SITE_OTHER): Payer: Medicare Other

## 2018-01-10 ENCOUNTER — Ambulatory Visit (INDEPENDENT_AMBULATORY_CARE_PROVIDER_SITE_OTHER): Payer: Medicare Other | Admitting: Primary Care

## 2018-01-10 ENCOUNTER — Encounter: Payer: Self-pay | Admitting: Primary Care

## 2018-01-10 DIAGNOSIS — J479 Bronchiectasis, uncomplicated: Secondary | ICD-10-CM | POA: Diagnosis not present

## 2018-01-10 DIAGNOSIS — Z23 Encounter for immunization: Secondary | ICD-10-CM

## 2018-01-10 DIAGNOSIS — M810 Age-related osteoporosis without current pathological fracture: Secondary | ICD-10-CM

## 2018-01-10 MED ORDER — SODIUM CHLORIDE 3 % IN NEBU: INHALATION_SOLUTION | RESPIRATORY_TRACT | 2 refills | Status: DC

## 2018-01-10 MED ORDER — DENOSUMAB 60 MG/ML ~~LOC~~ SOSY
60.0000 mg | PREFILLED_SYRINGE | Freq: Once | SUBCUTANEOUS | Status: AC
  Administered 2018-01-10: 60 mg via SUBCUTANEOUS

## 2018-01-10 NOTE — Patient Instructions (Addendum)
Received influenza vaccine today  Follow up in 4-6 months with Dr. Lake Bells   We may consider adding another nebulizer medication to your current regimen called Perforomist (I will speak with dr. Lake Bells to see if he thinks this may be beneficial to your treatment)

## 2018-01-10 NOTE — Assessment & Plan Note (Addendum)
-   Cough with white mucus (this is to be expected) - Continue hypertonic saline neb, therapy vest, flutter valve - Received influenza vaccine today

## 2018-01-10 NOTE — Progress Notes (Signed)
Reviewed, agree 

## 2018-01-10 NOTE — Progress Notes (Signed)
@Patient  ID: Veronica [name], female    DOB: 02/17/1951, 67 y.o.   MRN: [id number]  Chief Complaint  Patient presents with  . Follow-up    Feels better, SOB at baseline, cough with little thick white mucus    Referring provider: Plotnikov, Evie Lacks, MD  HPI: 67 year old female, never smoked (passive smoke exposure). PMH asthma, pulmonary emphysema, chronic bronchitis, bronchiectasis. Patient of Dr. Lake Bells, last seen in May 2019. Doing better since started on therapy vest twice daily. Continues to have chronic cough which is not likely to go away completely. Maintained on Pulmicort BID and albuterol  prn and hypertonic saline neb treatment with therapy vest twice daily. PRN flutter valve.   01/08/2018 Patient presents today for 4 month office visit.  Complains of shortness of breath, wheezing and chest tightness for the last 4 weeks.  She is having difficulty bringing up mucus describes it as thick and string-like.  Thinks her symptoms could be related to allergies, she has not tried taking an antihistamine. She continues using Pulmicort twice daily, has required albuterol nebulizer up to 4 times a day for wheezing.  Also using hypertonic saline neb treatments with therapy vest twice daily, flutter valve and Mucinex.  Patient asking if it is okay to receive influenza vaccine, had shot last year with no adverse reaction.  Reports previous localized reaction to flu shot many years ago. Advised to start Zyrtec daily, mucinex twice daily. Received depo-medrol inj. CXR showed no evidence for acute cardiopulmonary abnormality. Ok to received influenza vaccine when better.   01/10/2018 Patient presents today for 1 week follow-up. She is feeling much better. Breathing is at her baseline. She gets short of breath when she's walking and has to stop. Some nasal congestion and chronic cough. Started zyrtec last week. Continues Pulmicort twice a day, still using albuterol nebulizer 4 times a day. States that  she feels she needs her albuterol and also somewhat preventative. Denies chest pain, wheeze. Afebrile.     Significant testing reviewed: HRCT CHEST W/O 06/28/16 images independently reviewed showing normal pulmonary parenchyma with some mucus plugging and bronchiectasis in the lingula and right middle lobe  07/09/16: FVC 1.78 L (58%) FEV1 1.48 L (64%) FEV1/FVC 0.83 FEF 25-75 1.71 L (85%) negative bronchodilator response TLC 4.08 L (81%) RV 104% ERV 35% DLCO corrected 66%   Allergies  Allergen Reactions  . Avelox [Moxifloxacin Hcl In Nacl] Anaphylaxis, Swelling and Rash  . Cephalexin Shortness Of Breath and Itching  . Moxifloxacin Anaphylaxis, Itching, Swelling and Rash    Avelox  . Amantadine Hcl Other (See Comments)    Unknown  . Bee Venom Itching and Swelling    Localized  . Cymbalta [Duloxetine Hcl] Nausea Only    Dizzy  . Cyproheptadine Hcl Other (See Comments)    Unknown  . Doxycycline Hyclate Other (See Comments)    High blood pressure  . Effexor [Venlafaxine Hydrochloride] Other (See Comments)    elev BP (sky high), dizzy, shaking, ER visit  . Effexor [Venlafaxine]     Restless   . Fluticasone-Salmeterol Itching  . Guaifenesin Other (See Comments)    Unknown  . Imipramine Hcl Other (See Comments)    Unknown   . Lactose Intolerance (Gi) Other (See Comments)    Per allergy testing  . Latex Itching    dermatitis  . Oxycodone-Acetaminophen Itching  . Phenytoin Other (See Comments)    Pt must DILANTIN "brand name" only   . Pirbuterol Acetate Other (See Comments)  Maxair, Unknown  . Remeron [Mirtazapine]     nightmares  . Salmeterol Xinafoate Other (See Comments)    Shaking, Serevent  . Viibryd [Vilazodone Hcl] Itching and Nausea Only    shaky  . Zolpidem Tartrate Other (See Comments)    REACTION: not effective  . Azithromycin Itching and Rash  . Ciprofloxacin Itching and Rash  . Erythromycin Base Itching and Rash  . Flagyl [Metronidazole Hcl] Itching and  Rash  . Fluconazole Itching and Rash  . Fluoxetine Rash  . Lansoprazole Itching and Rash  . Metoclopramide Hcl Itching and Rash  . Montelukast Sodium Itching and Rash  . Penicillins Itching and Rash    All cillins, Has patient had a PCN reaction causing immediate rash, facial/tongue/throat swelling, SOB or lightheadedness with hypotension: Yes Has patient had a PCN reaction causing severe rash involving mucus membranes or skin necrosis: No Has patient had a PCN reaction that required hospitalization No Has patient had a PCN reaction occurring within the last 10 years: Yes If all of the above answers are "NO", then may proceed with Cephalosporin use.   [name] Propulsid [Cisapride] Itching and Rash  . Reglan [Metoclopramide] Itching and Rash  . Sulfadiazine Itching and Rash  . Sulfamethoxazole-Trimethoprim Itching and Rash  . Telithromycin Itching and Rash  . Tetracycline Hcl Itching and Rash  . Topiramate Itching and Rash  . Valproic Acid Rash    Immunization History  Administered Date(s) Administered  . Influenza, High Dose Seasonal PF 01/10/2018  . Influenza,inj,Quad PF,6+ Mos 05/12/2017  . Pneumococcal Conjugate-13 11/13/2013  . Pneumococcal Polysaccharide-23 02/19/2004, 01/29/2009, 10/07/2015  . Td 02/11/2014    Past Medical History:  Diagnosis Date  . Asthma   . AVM (arteriovenous malformation)   . Bronchitis, chronic ([place])   . Colon polyp 01/04/91   hyperplastic  . COPD (chronic obstructive pulmonary disease) ([place])   . CVA (cerebral infarction)    following brain surgery  . Depression   . Diverticulosis of colon 02/17/06  . Endometriosis   . Gastric ulcer   . GERD (gastroesophageal reflux disease)   . Hyperlipidemia   . Hypertension   . LBP (low back pain)   . Migraine headache   . OA (osteoarthritis)   . PONV (postoperative nausea and vomiting)    slight nausea  . Seizure disorder ([place])    entered twice   . Seizure disorder ([place])   . Seizures ([place])   .  Shortness of breath dyspnea   . Sjogren's disease ([place]) 2010   per Dr. Owens Shark, [place]  . Stroke [facility])    right side weakness  . Thyroid nodule   . Vitamin B 12 deficiency   . Vitamin D deficiency     Tobacco History: Social History   Tobacco Use  Smoking Status Passive Smoke Exposure - Never Smoker  Smokeless Tobacco Never Used  Tobacco Comment   Father & mutiple other family members smoked.   Counseling given: Not Answered Comment: Father & mutiple other family members smoked.   Outpatient Medications Prior to Visit  Medication Sig Dispense Refill  . acetaminophen-codeine (TYLENOL #3) 300-30 MG tablet Take 1 tablet by mouth every 4 (four) hours as needed. 60 tablet 3  . albuterol (PROAIR HFA) 108 (90 Base) MCG/ACT inhaler Inhale 1-2 puffs into the lungs every 4 (four) hours as needed for wheezing or shortness of breath. 18 g 5  . albuterol (PROVENTIL) (2.5 MG/3ML) 0.083% nebulizer solution USE 1 VIAL IN NEBULIZER 2 TIMES DAILY. Generic: VENTOLIN 60  vial 5  . aspirin 81 MG chewable tablet Chew by mouth daily.    [name] atorvastatin (LIPITOR) 40 MG tablet TAKE 1 TABLET(40 MG) BY MOUTH DAILY 90 tablet 3  . budesonide (PULMICORT) 0.25 MG/2ML nebulizer solution USE 1 VIAL IN NEBULIZER 2 TIMES DAILY. Generic: PULMICORT 120 mL 2  . cariprazine (VRAYLAR) capsule Take 1 capsule (1.5 mg total) by mouth daily. 90 capsule 3  . cetirizine (ZYRTEC) 5 MG tablet Take 1 tablet (5 mg total) by mouth daily. 30 tablet 1  . Cholecalciferol 1000 units tablet Take 1,000 Units by mouth daily.    . clonazePAM (KLONOPIN) 1 MG tablet 2 po qhs and 1/2 tab in addition in daytime prn 225 tablet 3  . cyanocobalamin (,VITAMIN B-12,) 1000 MCG/ML injection Inject 1 mL (1,000 mcg total) into the muscle every 14 (fourteen) days. 10 mL 3  . cyclobenzaprine (FLEXERIL) 10 MG tablet Take 1 tablet (10 mg total) by mouth 3 (three) times daily as needed for muscle spasms. 90 tablet 0  . diphenoxylate-atropine (LOMOTIL) 2.5-0.025  MG tablet Take 1-2 tablets by mouth 4 (four) times daily as needed for diarrhea or loose stools. 30 tablet 0  . Docusate Calcium (STOOL SOFTENER PO) Take 100 mg by mouth as needed (for constipation).     [name] escitalopram (LEXAPRO) 20 MG tablet Take 1 tablet (20 mg total) by mouth daily. 90 tablet 3  . furosemide (LASIX) 20 MG tablet Take 1 tablet (20 mg total) by mouth daily as needed. 90 tablet 3  . hydrochlorothiazide (MICROZIDE) 12.5 MG capsule Take 1 capsule (12.5 mg total) by mouth 3 (three) times a week. Mondays, Wednesdays, and Fridays 45 capsule 3  . Insulin Syringe-Needle U-100 (B-D INSULIN SYRINGE 1CC/26G) 26G X 1/2" 1 ML MISC Inject 1 each into the skin as directed. 100 each 5  . levothyroxine (SYNTHROID, LEVOTHROID) 88 MCG tablet Take 1 tablet (88 mcg total) by mouth daily. 90 tablet 3  . magic mouthwash SOLN Take 5 mLs by mouth 4 (four) times daily. Swish, hold and spit out---Do  Not swallow.  100 ml of dexamethasone 0.5 mg per 5 ml elixir 60 ml nystatin 100,000 Unit 100 ml of diphenhydramine 12.5 mg per 5 ml elixir 260 mL 3  . nitroGLYCERIN (NITROSTAT) 0.4 MG SL tablet PLACE 1 TABLET UNDER THE TONGUE EVERY 5 MINUTES AS NEEDED FOR CHEST PAIN 25 tablet 3  . ondansetron (ZOFRAN) 4 MG tablet Take 1 tablet (4 mg total) by mouth every 8 (eight) hours as needed for nausea or vomiting. 21 tablet 0  . phenytoin (DILANTIN) 100 MG ER capsule 200 mg every morning and 100 mg every evening, DILANTIN "brand name" only 270 capsule 3  . promethazine (PHENERGAN) 25 MG tablet Take 0.5 tablets (12.5 mg total) by mouth every 6 (six) hours as needed. 60 tablet 1  . promethazine-codeine (PHENERGAN WITH CODEINE) 6.25-10 MG/5ML syrup Take 5 mLs by mouth every 6 (six) hours as needed for cough. 300 mL 1  . Respiratory Therapy Supplies (FLUTTER) DEVI Use after nebulization treatments 1 each 0  . sodium chloride (MURO 128) 5 % ophthalmic solution Place 1 drop into both eyes as needed for irritation.     .  traZODone (DESYREL) 50 MG tablet Take 4 tablets (200 mg total) by mouth at bedtime. 360 tablet 3  . verapamil (CALAN-SR) 240 MG CR tablet Take 1 tablet (240 mg total) by mouth at bedtime. 90 tablet 3  . sodium chloride HYPERTONIC 3 % nebulizer solution USE  1 VIAL VIA NEBULIZER TWICE DAILY 240 mL 2   No facility-administered medications prior to visit.     Review of Systems  Review of Systems  Constitutional: Negative.   HENT: Negative.   Respiratory: Positive for cough and shortness of breath. Negative for wheezing.   Cardiovascular: Negative.     Physical Exam  BP 110/78 (BP Location: Left Arm, Cuff Size: Normal)   Pulse (!) 58   Temp (!) 97.4 F (36.3 C)   Ht 5\' 1"  (4.128 m)   Wt 143 lb 9.6 oz (65.1 kg)   SpO2 98%   BMI 27.13 kg/m  Physical Exam  Constitutional: She is oriented to person, place, and time. She appears well-developed and well-nourished.  HENT:  Head: Normocephalic and atraumatic.  Eyes: Pupils are equal, round, and reactive to light. EOM are normal.  Neck: Normal range of motion. Neck supple.  Cardiovascular: Normal rate, regular rhythm and normal heart sounds.  No murmur heard. Pulmonary/Chest: Effort normal and breath sounds normal. No respiratory distress. She has no wheezes.  Abdominal: Soft. Bowel sounds are normal. There is no tenderness.  Neurological: She is alert and oriented to person, place, and time.  Skin: Skin is warm and dry. No rash noted. No erythema.  Psychiatric: She has a normal mood and affect. Her behavior is normal. Judgment normal.     Lab Results:  CBC    Component Value Date/Time   WBC 10.1 09/21/2017 1427   RBC 4.52 09/21/2017 1427   HGB 13.9 09/21/2017 1427   HCT 41.2 09/21/2017 1427   PLT 352.0 09/21/2017 1427   MCV 91.2 09/21/2017 1427   MCH 29.1 08/21/2015 1450   MCHC 33.6 09/21/2017 1427   RDW 15.2 09/21/2017 1427   LYMPHSABS 2.5 09/21/2017 1427   MONOABS 1.3 (H) 09/21/2017 1427   EOSABS 0.1 09/21/2017 1427    BASOSABS 0.0 09/21/2017 1427    BMET    Component Value Date/Time   NA 140 09/21/2017 1427   K 5.0 09/21/2017 1427   CL 100 09/21/2017 1427   CO2 34 (H) 09/21/2017 1427   GLUCOSE 95 09/21/2017 1427   BUN 7 09/21/2017 1427   CREATININE 0.70 09/21/2017 1427   CALCIUM 8.9 09/21/2017 1427   GFRNONAA >60 08/31/2015 0254   GFRAA >60 08/31/2015 0254    BNP No results found for: BNP  ProBNP    Component Value Date/Time   PROBNP 78.0 08/01/2015 1234    Imaging: Dg Chest 2 View  Result Date: 01/03/2018 CLINICAL DATA:  Wheezing AND cough, intermittent 1.5 wks, HTN, hx of bronchiectasis. EXAM: CHEST - 2 VIEW COMPARISON:  01/28/2017 FINDINGS: Heart size is normal. There is atherosclerotic calcification of the thoracic aorta. The lungs are free of focal consolidations and pleural effusions. No pulmonary edema. Surgical clips are present in the UPPER abdomen. Visualized osseous structures have a normal appearance. IMPRESSION: No evidence for acute cardiopulmonary abnormality. Aortic atherosclerosis. (ICD10-I70.0) Electronically Signed   By: Nolon Nations M.D.   On: 01/03/2018 16:20     Assessment & Plan:   Asthma - Breathing has returned to baseline. Experiencing sob with walking, using Albuterol 4 times daily  - Continue Pulmicort BID - Consider adding nebulized LABA (Brovana or Perforomist)- patient has mulitple allergies, will check with Dr. Lake Bells for his input   Bronchiectasis ([place]) - Cough with white mucus (this is to be expected) - Continue hypertonic saline neb, therapy vest, flutter valve - Received influenza vaccine today   FU in 4-6 months  Martyn Ehrich, NP 01/10/2018

## 2018-01-10 NOTE — Assessment & Plan Note (Addendum)
-   Breathing has returned to baseline. Experiencing sob with walking, using Albuterol 4 times daily  - Continue Pulmicort BID - Consider adding nebulized LABA (Brovana or Perforomist)- patient has mulitple allergies, will check with Dr. Lake Bells for his input

## 2018-01-10 NOTE — Telephone Encounter (Signed)
I saw Veronica [name] today for one-week follow-up. She feel better, back to her baseline. She does complain of sob with walking and has been using Albuterol neb 4 times a day. She is maintained on Pulmicort twice daily for history of asthma.    I was wondering if we could add Perforomist or Brovana to her current therapy, do feel this would be beneficial for her?  She does have a lot of allergies (Fluticasone-salmeterol, salmeterol xinafoate, pirbuterol acetate, guaifenesin, Singulair).   Thanks, [employer]

## 2018-01-18 ENCOUNTER — Other Ambulatory Visit: Payer: Self-pay

## 2018-01-18 DIAGNOSIS — J470 Bronchiectasis with acute lower respiratory infection: Secondary | ICD-10-CM

## 2018-01-18 MED ORDER — FORMOTEROL FUMARATE 20 MCG/2ML IN NEBU: 20.0000 ug | INHALATION_SOLUTION | Freq: Two times a day (BID) | RESPIRATORY_TRACT | 3 refills | Status: DC

## 2018-01-18 NOTE — Telephone Encounter (Signed)
Spoke with Dr. Lake Bells, he's ok with adding LABA nebulizer. Called patient to let her know, okay with trying. Currently taking and tolerating budesonide (pulmicort). If develops adverse reaction instructed to stop.   Please order Perforomist with DME company, take 1 nebulizer every 12 hours scheduled.

## 2018-01-23 ENCOUNTER — Ambulatory Visit (INDEPENDENT_AMBULATORY_CARE_PROVIDER_SITE_OTHER): Payer: Medicare Other | Admitting: Psychology

## 2018-01-23 DIAGNOSIS — F331 Major depressive disorder, recurrent, moderate: Secondary | ICD-10-CM

## 2018-01-30 ENCOUNTER — Other Ambulatory Visit: Payer: Self-pay | Admitting: Pharmacist

## 2018-01-30 NOTE — Patient Outreach (Signed)
[place] [place] [facility]) Care Management  01/30/2018  [name] 12-09-1950 [id number]  Outreach attempt to patient, HIPAA details verified by patient.  Purpose of call to see if patient would be interested in re-applying to see if eligible for manufacturer assistance for Dilantin from [place].  Patient reports she would like to apply to see if eligible for assistance in 2020.  She confirms her address in chart is correct.   Plan:  Will route note and letter to [place] to assist patient in applying for manufacturer assistance for Dilantin.   Patient is aware there is no guarantee she will be eligible.   Karrie Meres, PharmD, [place] [phone]

## 2018-01-31 ENCOUNTER — Other Ambulatory Visit: Payer: Self-pay | Admitting: Pharmacy Technician

## 2018-01-31 NOTE — Patient Outreach (Signed)
[place] [facility]) Care Management  01/31/2018  Veronica [name] 10-27-1950 [id number]   Received 2020 [place] patient assistance referral from [place] for Dilantin. Prepared patient portion to be mailed and faxed provider portion to Dr. Alain Marion.  Will follow up with patient in 5-7 business days to confirm applications have been received.  Veronica [name] Certified Pharmacy Technician [place] Management Direct [phone]

## 2018-02-01 ENCOUNTER — Other Ambulatory Visit: Payer: Self-pay

## 2018-02-05 ENCOUNTER — Other Ambulatory Visit: Payer: Self-pay | Admitting: Internal Medicine

## 2018-02-06 ENCOUNTER — Other Ambulatory Visit: Payer: Self-pay

## 2018-02-06 ENCOUNTER — Ambulatory Visit (INDEPENDENT_AMBULATORY_CARE_PROVIDER_SITE_OTHER): Payer: Medicare Other | Admitting: Psychology

## 2018-02-06 DIAGNOSIS — F331 Major depressive disorder, recurrent, moderate: Secondary | ICD-10-CM

## 2018-02-07 ENCOUNTER — Other Ambulatory Visit: Payer: Self-pay | Admitting: Pharmacist

## 2018-02-07 MED ORDER — PROMETHAZINE-CODEINE 6.25-10 MG/5ML PO SYRP: 5.0000 mL | ORAL_SOLUTION | Freq: Four times a day (QID) | ORAL | 1 refills | Status: DC | PRN

## 2018-02-07 NOTE — Patient Outreach (Signed)
[place] [facility]) Care Management  02/07/2018  Veronica [name] 13-Aug-1950 [id number]  Patient arrived at [employer] office wondering if she could get assistance completing [place] patient assistance application for brand name Dilantin.    Patient verified HIPAA details.  Patient had brought her proof of income, Medicare Part D card and patient portion of application.   Reviewed application with patient, and she signed application.  Patient provided permission to obtain a copy of her proof of income and Medicare Part D card.   Original proof of income and Medicare Part D card were provided back to patient, and she was provided with a copy of her portion of [employer] patient assistance application at her request.   She denies questions/concerns at this time about patient assistance application.   Plan:  Provided application, copy of proof of income, and copy of Medicare Part D card to [place], who is assisting with patient assistance process.   Will route this note to [place].   Karrie Meres, PharmD, [place] [phone]

## 2018-02-13 ENCOUNTER — Other Ambulatory Visit: Payer: Self-pay | Admitting: Pharmacy Technician

## 2018-02-13 NOTE — Patient Outreach (Signed)
[place] [facility]) Care Management  02/13/2018  Veronica [name] [birth year] [id number]   Follow up call to [place] patient assistance to check status of Dilantin application. [place] Schooner confirms patient has been approved as of 02/08/2018 until 03/15/2019. Patients new enrollment will start 03/15/2018.  Unsuccessful call to patient, HIPAA compliant voicemail left.  Will make 2nd call attempt in 2-3 business days.  Maud Deed Chana Bode [place] Certified Pharmacy Technician [place] Management Direct [phone]

## 2018-02-16 ENCOUNTER — Other Ambulatory Visit: Payer: Self-pay | Admitting: Pharmacy Technician

## 2018-02-16 NOTE — Patient Outreach (Signed)
[place] [facility]) Care Management  02/16/2018  DESEREE ZEMAITIS 1950/05/25 [id number]    Successful call placed to patient regarding patient assistance update, HIPAA identifiers verified. Informed Mrs. Reece that she has been approved for her Dilantin until 03/15/2019. Requested that she contact me if she has any issues getting her refill next year. Patient has requested that I send her a letter stating that she has been approved. Informed her I will send a letter HOWEVER information is only based on what [place] representative told me, patient stated she understands.   Will follow up patient at the end of February to confirm she has received Dilantin from [employer] with 2020 approval.  Maud Deed. Chana Bode [place] Certified Pharmacy Technician [place] Management Direct Dial:[phone]

## 2018-02-20 ENCOUNTER — Ambulatory Visit (INDEPENDENT_AMBULATORY_CARE_PROVIDER_SITE_OTHER): Payer: Medicare Other | Admitting: Psychology

## 2018-02-20 DIAGNOSIS — F331 Major depressive disorder, recurrent, moderate: Secondary | ICD-10-CM
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
## 2018-02-21 ENCOUNTER — Ambulatory Visit (INDEPENDENT_AMBULATORY_CARE_PROVIDER_SITE_OTHER): Payer: Medicare Other | Admitting: *Deleted

## 2018-02-21 VITALS — BP 138/82 | HR 60 | Resp 18 | Ht 61.0 in | Wt 144.0 lb

## 2018-02-21 DIAGNOSIS — Z0000 Encounter for general adult medical examination without abnormal findings: Secondary | ICD-10-CM | POA: Diagnosis not present

## 2018-02-21 NOTE — Patient Instructions (Addendum)
Continue doing brain stimulating activities (puzzles, reading, adult coloring books, staying active) to keep memory sharp.   Continue to eat heart healthy diet (full of fruits, vegetables, whole grains, lean protein, water--limit salt, fat, and sugar intake) and increase physical activity as tolerated.   Veronica [name] , Thank you for taking time to come for your Medicare Wellness Visit. I appreciate your ongoing commitment to your health goals. Please review the following plan we discussed and let me know if I can assist you in the future.   These are the goals we discussed: Goals    . Patient Stated     Be more socially active, I will go to [facility] and do something I enjoy and continue to watch [name] and/or start to visit churches     . Patient Stated     Make myself feel better by letting go of the past, help others as much as possible, write down 3 things I am thankful for daily, continue to go to church. Get up daily and go some place and perhaps look into the [facility] for activities. Go to Christmas program at [name].       This is a list of the screening recommended for you and due dates:  Health Maintenance  Topic Date Due  .  Hepatitis C: One time screening is recommended by Center for Disease Control  (CDC) for  adults born from [birth year] through 1966.   01-21-1951  . Colon Cancer Screening  01/19/2019  . Mammogram  04/14/2019  . Tetanus Vaccine  02/12/2024  . Flu Shot  Completed  . DEXA scan (bone density measurement)  Completed  . Pneumonia vaccines  Completed   Health Maintenance, Female Adopting a healthy lifestyle and getting preventive care can go a long way to promote health and wellness. Talk with your health care provider about what schedule of regular examinations is right for you. This is a good chance for you to check in with your provider about disease prevention and staying healthy. In between checkups, there are plenty of things you can do on  your own. Experts have done a lot of research about which lifestyle changes and preventive measures are most likely to keep you healthy. Ask your health care provider for more information. Weight and diet Eat a healthy diet  Be sure to include plenty of vegetables, fruits, low-fat dairy products, and lean protein.  Do not eat a lot of foods high in solid fats, added sugars, or salt.  Get regular exercise. This is one of the most important things you can do for your health. ? Most adults should exercise for at least 150 minutes each week. The exercise should increase your heart rate and make you sweat (moderate-intensity exercise). ? Most adults should also do strengthening exercises at least twice a week. This is in addition to the moderate-intensity exercise.  Maintain a healthy weight  Body mass index (BMI) is a measurement that can be used to identify possible weight problems. It estimates body fat based on height and weight. Your health care provider can help determine your BMI and help you achieve or maintain a healthy weight.  For females 41 years of age and older: ? A BMI below 18.5 is considered underweight. ? A BMI of 18.5 to 24.9 is normal. ? A BMI of 25 to 29.9 is considered overweight. ? A BMI of 30 and above is considered obese.  Watch levels of cholesterol and blood lipids  You  should start having your blood tested for lipids and cholesterol at 68 years of age, then have this test every 5 years.  You may need to have your cholesterol levels checked more often if: ? Your lipid or cholesterol levels are high. ? You are older than 68 years of age. ? You are at high risk for heart disease.  Cancer screening Lung Cancer  Lung cancer screening is recommended for adults 2-62 years old who are at high risk for lung cancer because of a history of smoking.  A yearly low-dose CT scan of the lungs is recommended for people who: ? Currently smoke. ? Have quit within the past  15 years. ? Have at least a 30-pack-year history of smoking. A pack year is smoking an average of one pack of cigarettes a day for 1 year.  Yearly screening should continue until it has been 15 years since you quit.  Yearly screening should stop if you develop a health problem that would prevent you from having lung cancer treatment.  Breast Cancer  Practice breast self-awareness. This means understanding how your breasts normally appear and feel.  It also means doing regular breast self-exams. Let your health care provider know about any changes, no matter how small.  If you are in your 20s or 30s, you should have a clinical breast exam (CBE) by a health care provider every 1-3 years as part of a regular health exam.  If you are 62 or older, have a CBE every year. Also consider having a breast X-ray (mammogram) every year.  If you have a family history of breast cancer, talk to your health care provider about genetic screening.  If you are at high risk for breast cancer, talk to your health care provider about having an MRI and a mammogram every year.  Breast cancer gene (BRCA) assessment is recommended for women who have family members with BRCA-related cancers. BRCA-related cancers include: ? Breast. ? Ovarian. ? Tubal. ? Peritoneal cancers.  Results of the assessment will determine the need for genetic counseling and BRCA1 and BRCA2 testing.  Cervical Cancer Your health care provider may recommend that you be screened regularly for cancer of the pelvic organs (ovaries, uterus, and vagina). This screening involves a pelvic examination, including checking for microscopic changes to the surface of your cervix (Pap test). You may be encouraged to have this screening done every 3 years, beginning at age 59.  For women ages 24-65, health care providers may recommend pelvic exams and Pap testing every 3 years, or they may recommend the Pap and pelvic exam, combined with testing for human  papilloma virus (HPV), every 5 years. Some types of HPV increase your risk of cervical cancer. Testing for HPV may also be done on women of any age with unclear Pap test results.  Other health care providers may not recommend any screening for nonpregnant women who are considered low risk for pelvic cancer and who do not have symptoms. Ask your health care provider if a screening pelvic exam is right for you.  If you have had past treatment for cervical cancer or a condition that could lead to cancer, you need Pap tests and screening for cancer for at least 20 years after your treatment. If Pap tests have been discontinued, your risk factors (such as having a new sexual partner) need to be reassessed to determine if screening should resume. Some women have medical problems that increase the chance of getting cervical cancer. In these cases,  your health care provider may recommend more frequent screening and Pap tests.  Colorectal Cancer  This type of cancer can be detected and often prevented.  Routine colorectal cancer screening usually begins at 68 years of age and continues through 68 years of age.  Your health care provider may recommend screening at an earlier age if you have risk factors for colon cancer.  Your health care provider may also recommend using home test kits to check for hidden blood in the stool.  A small camera at the end of a tube can be used to examine your colon directly (sigmoidoscopy or colonoscopy). This is done to check for the earliest forms of colorectal cancer.  Routine screening usually begins at age 67.  Direct examination of the colon should be repeated every 5-10 years through 68 years of age. However, you may need to be screened more often if early forms of precancerous polyps or small growths are found.  Skin Cancer  Check your skin from head to toe regularly.  Tell your health care provider about any new moles or changes in moles, especially if there is  a change in a mole's shape or color.  Also tell your health care provider if you have a mole that is larger than the size of a pencil eraser.  Always use sunscreen. Apply sunscreen liberally and repeatedly throughout the day.  Protect yourself by wearing long sleeves, pants, a wide-brimmed hat, and sunglasses whenever you are outside.  Heart disease, diabetes, and high blood pressure  High blood pressure causes heart disease and increases the risk of stroke. High blood pressure is more likely to develop in: ? People who have blood pressure in the high end of the normal range (130-139/85-89 mm Hg). ? People who are overweight or obese. ? People who are African American.  If you are 30-33 years of age, have your blood pressure checked every 3-5 years. If you are 25 years of age or older, have your blood pressure checked every year. You should have your blood pressure measured twice-once when you are at a hospital or clinic, and once when you are not at a hospital or clinic. Record the average of the two measurements. To check your blood pressure when you are not at a hospital or clinic, you can use: ? An automated blood pressure machine at a pharmacy. ? A home blood pressure monitor.  If you are between 1 years and 53 years old, ask your health care provider if you should take aspirin to prevent strokes.  Have regular diabetes screenings. This involves taking a blood sample to check your fasting blood sugar level. ? If you are at a normal weight and have a low risk for diabetes, have this test once every three years after 68 years of age. ? If you are overweight and have a high risk for diabetes, consider being tested at a younger age or more often. Preventing infection Hepatitis B  If you have a higher risk for hepatitis B, you should be screened for this virus. You are considered at high risk for hepatitis B if: ? You were born in a country where hepatitis B is common. Ask your health  care provider which countries are considered high risk. ? Your parents were born in a high-risk country, and you have not been immunized against hepatitis B (hepatitis B vaccine). ? You have HIV or AIDS. ? You use needles to inject street drugs. ? You live with someone who has hepatitis  B. ? You have had sex with someone who has hepatitis B. ? You get hemodialysis treatment. ? You take certain medicines for conditions, including cancer, organ transplantation, and autoimmune conditions.  Hepatitis C  Blood testing is recommended for: ? Everyone born from [birth year] through 1965. ? Anyone with known risk factors for hepatitis C.  Sexually transmitted infections (STIs)  You should be screened for sexually transmitted infections (STIs) including gonorrhea and chlamydia if: ? You are sexually active and are younger than 68 years of age. ? You are older than 68 years of age and your health care provider tells you that you are at risk for this type of infection. ? Your sexual activity has changed since you were last screened and you are at an increased risk for chlamydia or gonorrhea. Ask your health care provider if you are at risk.  If you do not have HIV, but are at risk, it may be recommended that you take a prescription medicine daily to prevent HIV infection. This is called pre-exposure prophylaxis (PrEP). You are considered at risk if: ? You are sexually active and do not regularly use condoms or know the HIV status of your partner(s). ? You take drugs by injection. ? You are sexually active with a partner who has HIV.  Talk with your health care provider about whether you are at high risk of being infected with HIV. If you choose to begin PrEP, you should first be tested for HIV. You should then be tested every 3 months for as long as you are taking PrEP. Pregnancy  If you are premenopausal and you may become pregnant, ask your health care provider about preconception counseling.  If you  may become pregnant, take 400 to 800 micrograms (mcg) of folic acid every day.  If you want to prevent pregnancy, talk to your health care provider about birth control (contraception). Osteoporosis and menopause  Osteoporosis is a disease in which the bones lose minerals and strength with aging. This can result in serious bone fractures. Your risk for osteoporosis can be identified using a bone density scan.  If you are 25 years of age or older, or if you are at risk for osteoporosis and fractures, ask your health care provider if you should be screened.  Ask your health care provider whether you should take a calcium or vitamin D supplement to lower your risk for osteoporosis.  Menopause may have certain physical symptoms and risks.  Hormone replacement therapy may reduce some of these symptoms and risks. Talk to your health care provider about whether hormone replacement therapy is right for you. Follow these instructions at home:  Schedule regular health, dental, and eye exams.  Stay current with your immunizations.  Do not use any tobacco products including cigarettes, chewing tobacco, or electronic cigarettes.  If you are pregnant, do not drink alcohol.  If you are breastfeeding, limit how much and how often you drink alcohol.  Limit alcohol intake to no more than 1 drink per day for nonpregnant women. One drink equals 12 ounces of beer, 5 ounces of , or 1 ounces of hard liquor.  Do not use street drugs.  Do not share needles.  Ask your health care provider for help if you need support or information about quitting drugs.  Tell your health care provider if you often feel depressed.  Tell your health care provider if you have ever been abused or do not feel safe at home. This information is not intended to  replace advice given to you by your health care provider. Make sure you discuss any questions you have with your health care provider. Document Released: 09/14/2010  Document Revised: 08/07/2015 Document Reviewed: 12/03/2014 Elsevier Interactive Patient Education  [employer].

## 2018-02-22 ENCOUNTER — Other Ambulatory Visit: Payer: Self-pay

## 2018-02-28 MED ORDER — PROMETHAZINE-CODEINE 6.25-10 MG/5ML PO SYRP: 5.0000 mL | ORAL_SOLUTION | Freq: Four times a day (QID) | ORAL | 1 refills | Status: DC | PRN

## 2018-03-09 ENCOUNTER — Ambulatory Visit (INDEPENDENT_AMBULATORY_CARE_PROVIDER_SITE_OTHER): Payer: Medicare Other | Admitting: Psychology

## 2018-03-09 DIAGNOSIS — F331 Major depressive disorder, recurrent, moderate: Secondary | ICD-10-CM | POA: Diagnosis not present

## 2018-03-20 ENCOUNTER — Ambulatory Visit (INDEPENDENT_AMBULATORY_CARE_PROVIDER_SITE_OTHER): Payer: Medicare Other | Admitting: Psychology

## 2018-03-20 DIAGNOSIS — F331 Major depressive disorder, recurrent, moderate: Secondary | ICD-10-CM

## 2018-04-03 ENCOUNTER — Ambulatory Visit: Payer: Medicare Other | Admitting: Psychology

## 2018-04-11 ENCOUNTER — Ambulatory Visit (INDEPENDENT_AMBULATORY_CARE_PROVIDER_SITE_OTHER): Payer: Medicare Other | Admitting: Internal Medicine

## 2018-04-11 ENCOUNTER — Ambulatory Visit (INDEPENDENT_AMBULATORY_CARE_PROVIDER_SITE_OTHER)
Admission: RE | Admit: 2018-04-11 | Discharge: 2018-04-11 | Disposition: A | Discharge status: Home / Self Care | Payer: Medicare Other | Source: Ambulatory Visit | Attending: Internal Medicine | Admitting: Internal Medicine

## 2018-04-11 ENCOUNTER — Encounter: Payer: Self-pay | Admitting: Internal Medicine

## 2018-04-11 VITALS — BP 136/84 | HR 65 | Temp 97.5°F | Ht 61.0 in | Wt 146.0 lb

## 2018-04-11 DIAGNOSIS — G8929 Other chronic pain: Secondary | ICD-10-CM | POA: Diagnosis not present

## 2018-04-11 DIAGNOSIS — R05 Cough: Secondary | ICD-10-CM

## 2018-04-11 DIAGNOSIS — I10 Essential (primary) hypertension: Secondary | ICD-10-CM

## 2018-04-11 DIAGNOSIS — E875 Hyperkalemia: Secondary | ICD-10-CM

## 2018-04-11 DIAGNOSIS — E538 Deficiency of other specified B group vitamins: Secondary | ICD-10-CM | POA: Diagnosis not present

## 2018-04-11 DIAGNOSIS — L509 Urticaria, unspecified: Secondary | ICD-10-CM

## 2018-04-11 DIAGNOSIS — M25551 Pain in right hip: Secondary | ICD-10-CM

## 2018-04-11 DIAGNOSIS — I25111 Atherosclerotic heart disease of native coronary artery with angina pectoris with documented spasm: Secondary | ICD-10-CM | POA: Diagnosis not present

## 2018-04-11 DIAGNOSIS — F329 Major depressive disorder, single episode, unspecified: Secondary | ICD-10-CM

## 2018-04-11 DIAGNOSIS — R059 Cough, unspecified: Secondary | ICD-10-CM

## 2018-04-11 DIAGNOSIS — E890 Postprocedural hypothyroidism: Secondary | ICD-10-CM

## 2018-04-11 DIAGNOSIS — E559 Vitamin D deficiency, unspecified: Secondary | ICD-10-CM

## 2018-04-11 DIAGNOSIS — R635 Abnormal weight gain: Secondary | ICD-10-CM

## 2018-04-11 DIAGNOSIS — J449 Chronic obstructive pulmonary disease, unspecified: Secondary | ICD-10-CM

## 2018-04-11 DIAGNOSIS — E539 Vitamin B deficiency, unspecified: Secondary | ICD-10-CM

## 2018-04-11 MED ORDER — CYANOCOBALAMIN 1000 MCG/ML IJ SOLN
1000.0000 ug | Freq: Once | INTRAMUSCULAR | Status: AC
  Administered 2018-04-11: 1000 ug via INTRAMUSCULAR

## 2018-04-11 MED ORDER — CLONAZEPAM 1 MG PO TABS: ORAL_TABLET | ORAL | 3 refills | Status: DC

## 2018-04-11 MED ORDER — PROMETHAZINE-CODEINE 6.25-10 MG/5ML PO SYRP: 5.0000 mL | ORAL_SOLUTION | Freq: Four times a day (QID) | ORAL | 1 refills | Status: DC | PRN

## 2018-04-11 MED ORDER — ESCITALOPRAM OXALATE 20 MG PO TABS: 20.0000 mg | ORAL_TABLET | Freq: Every day | ORAL | 3 refills | Status: DC

## 2018-04-11 MED ORDER — ACETAMINOPHEN-CODEINE #3 300-30 MG PO TABS: 1.0000 | ORAL_TABLET | ORAL | 3 refills | Status: DC | PRN

## 2018-04-11 MED ORDER — TRAZODONE HCL 50 MG PO TABS: 200.0000 mg | ORAL_TABLET | Freq: Every day | ORAL | 3 refills | Status: DC

## 2018-04-11 NOTE — Assessment & Plan Note (Signed)
Wt Readings from Last 3 Encounters:  04/11/18 146 lb (66.2 kg)  02/21/18 144 lb (65.3 kg)  01/10/18 143 lb 9.6 oz (65.1 kg)

## 2018-04-11 NOTE — Addendum Note (Signed)
Addended by: Marcina Millard on: 04/11/2018 01:44 PM   Modules accepted: Orders

## 2018-04-11 NOTE — Assessment & Plan Note (Signed)
B12 inj 

## 2018-04-11 NOTE — Assessment & Plan Note (Signed)
Calan SR 

## 2018-04-11 NOTE — Assessment & Plan Note (Signed)
Chronic Prom-cod syr prn

## 2018-04-11 NOTE — Progress Notes (Signed)
Subjective:  Patient ID: Veronica [name], female    DOB: 10-Aug-1950  Age: 68 y.o. MRN: [id number]  CC: No chief complaint on file.   HPI JOYCELYNN FRITSCHE presents for chronic cough, depression, B12 def and hypothyroidism f/u. The pt fell on her butt 3 wks ago - c/o R hip and groin pain...  Outpatient Medications Prior to Visit  Medication Sig Dispense Refill  . acetaminophen-codeine (TYLENOL #3) 300-30 MG tablet Take 1 tablet by mouth every 4 (four) hours as needed. 60 tablet 3  . albuterol (PROAIR HFA) 108 (90 Base) MCG/ACT inhaler Inhale 1-2 puffs into the lungs every 4 (four) hours as needed for wheezing or shortness of breath. 18 g 5  . albuterol (PROVENTIL) (2.5 MG/3ML) 0.083% nebulizer solution USE 1 VIAL IN NEBULIZER 2 TIMES DAILY. Generic: VENTOLIN 60 vial 5  . aspirin 81 MG chewable tablet Chew by mouth daily.    [name] atorvastatin (LIPITOR) 40 MG tablet TAKE 1 TABLET(40 MG) BY MOUTH DAILY 90 tablet 3  . budesonide (PULMICORT) 0.25 MG/2ML nebulizer solution USE 1 VIAL IN NEBULIZER 2 TIMES DAILY. Generic: PULMICORT 120 mL 2  . cariprazine (VRAYLAR) capsule Take 1 capsule (1.5 mg total) by mouth daily. 90 capsule 3  . cetirizine (ZYRTEC) 5 MG tablet Take 1 tablet (5 mg total) by mouth daily. 30 tablet 1  . Cholecalciferol 1000 units tablet Take 1,000 Units by mouth daily.    . clonazePAM (KLONOPIN) 1 MG tablet 2 po qhs and 1/2 tab in addition in daytime prn 225 tablet 3  . cyanocobalamin (,VITAMIN B-12,) 1000 MCG/ML injection Inject 1 mL (1,000 mcg total) into the muscle every 14 (fourteen) days. 10 mL 3  . cyclobenzaprine (FLEXERIL) 10 MG tablet Take 1 tablet (10 mg total) by mouth 3 (three) times daily as needed for muscle spasms. 90 tablet 0  . diphenoxylate-atropine (LOMOTIL) 2.5-0.025 MG tablet Take 1-2 tablets by mouth 4 (four) times daily as needed for diarrhea or loose stools. 30 tablet 0  . Docusate Calcium (STOOL SOFTENER PO) Take 100 mg by mouth as needed (for constipation).      [name] escitalopram (LEXAPRO) 20 MG tablet TAKE 1 TABLET BY MOUTH DAILY 90 tablet 3  . formoterol (PERFOROMIST) 20 MCG/2ML nebulizer solution Take 2 mLs (20 mcg total) by nebulization 2 (two) times daily. 120 mL 3  . furosemide (LASIX) 20 MG tablet Take 1 tablet (20 mg total) by mouth daily as needed. 90 tablet 3  . hydrochlorothiazide (MICROZIDE) 12.5 MG capsule Take 1 capsule (12.5 mg total) by mouth 3 (three) times a week. Mondays, Wednesdays, and Fridays 45 capsule 3  . Insulin Syringe-Needle U-100 (B-D INSULIN SYRINGE 1CC/26G) 26G X 1/2" 1 ML MISC Inject 1 each into the skin as directed. 100 each 5  . levothyroxine (SYNTHROID, LEVOTHROID) 88 MCG tablet Take 1 tablet (88 mcg total) by mouth daily. 90 tablet 3  . magic mouthwash SOLN Take 5 mLs by mouth 4 (four) times daily. Swish, hold and spit out---Do  Not swallow.  100 ml of dexamethasone 0.5 mg per 5 ml elixir 60 ml nystatin 100,000 Unit 100 ml of diphenhydramine 12.5 mg per 5 ml elixir 260 mL 3  . nitroGLYCERIN (NITROSTAT) 0.4 MG SL tablet PLACE 1 TABLET UNDER THE TONGUE EVERY 5 MINUTES AS NEEDED FOR CHEST PAIN 25 tablet 3  . ondansetron (ZOFRAN) 4 MG tablet Take 1 tablet (4 mg total) by mouth every 8 (eight) hours as needed for nausea or vomiting. 21 tablet  0  . phenytoin (DILANTIN) 100 MG ER capsule 200 mg every morning and 100 mg every evening, DILANTIN "brand name" only 270 capsule 3  . promethazine (PHENERGAN) 25 MG tablet Take 0.5 tablets (12.5 mg total) by mouth every 6 (six) hours as needed. 60 tablet 1  . promethazine-codeine (PHENERGAN WITH CODEINE) 6.25-10 MG/5ML syrup Take 5 mLs by mouth every 6 (six) hours as needed for cough. 300 mL 1  . Respiratory Therapy Supplies (FLUTTER) DEVI Use after nebulization treatments 1 each 0  . sodium chloride (MURO 128) 5 % ophthalmic solution Place 1 drop into both eyes as needed for irritation.     . sodium chloride HYPERTONIC 3 % nebulizer solution USE 1 VIAL VIA NEBULIZER TWICE DAILY 240 mL  2  . traZODone (DESYREL) 50 MG tablet Take 4 tablets (200 mg total) by mouth at bedtime. 360 tablet 3  . verapamil (CALAN-SR) 240 MG CR tablet Take 1 tablet (240 mg total) by mouth at bedtime. 90 tablet 3   No facility-administered medications prior to visit.     ROS: Review of Systems  Constitutional: Positive for fatigue. Negative for activity change, appetite change, chills and unexpected weight change.  HENT: Negative for congestion, mouth sores and sinus pressure.   Eyes: Negative for visual disturbance.  Respiratory: Positive for cough. Negative for chest tightness.   Gastrointestinal: Negative for abdominal pain and nausea.  Genitourinary: Negative for difficulty urinating, frequency and vaginal pain.  Musculoskeletal: Positive for arthralgias. Negative for back pain and gait problem.  Skin: Negative for pallor and rash.  Neurological: Negative for dizziness, tremors, weakness, numbness and headaches.  Psychiatric/Behavioral: Positive for dysphoric mood. Negative for confusion, sleep disturbance and suicidal ideas. The patient is nervous/anxious.     Objective:  Ht 5\' 1"  (1.549 m)   Wt 146 lb (66.2 kg)   BMI 27.59 kg/m   BP Readings from Last 3 Encounters:  02/21/18 138/82  01/10/18 110/78  01/03/18 126/72    Wt Readings from Last 3 Encounters:  04/11/18 146 lb (66.2 kg)  02/21/18 144 lb (65.3 kg)  01/10/18 143 lb 9.6 oz (65.1 kg)    Physical Exam Constitutional:      General: She is not in acute distress.    Appearance: She is well-developed.  HENT:     Head: Normocephalic.     Right Ear: External ear normal.     Left Ear: External ear normal.     Nose: Nose normal.  Eyes:     General:        Right eye: No discharge.        Left eye: No discharge.     Conjunctiva/sclera: Conjunctivae normal.     Pupils: Pupils are equal, round, and reactive to light.  Neck:     Musculoskeletal: Normal range of motion and neck supple.     Thyroid: No thyromegaly.      Vascular: No JVD.     Trachea: No tracheal deviation.  Cardiovascular:     Rate and Rhythm: Normal rate and regular rhythm.     Heart sounds: Normal heart sounds.  Pulmonary:     Effort: No respiratory distress.     Breath sounds: No stridor. No wheezing.  Abdominal:     General: Bowel sounds are normal. There is no distension.     Palpations: Abdomen is soft. There is no mass.     Tenderness: There is no abdominal tenderness. There is no guarding or rebound.  Musculoskeletal:  General: No tenderness.  Lymphadenopathy:     Cervical: No cervical adenopathy.  Skin:    Findings: No erythema or rash.  Neurological:     Cranial Nerves: No cranial nerve deficit.     Motor: No abnormal muscle tone.     Coordination: Coordination abnormal.     Deep Tendon Reflexes: Reflexes normal.  Psychiatric:        Behavior: Behavior normal.        Thought Content: Thought content normal.        Judgment: Judgment normal.    coughing Cane ataxic R hip is tender w/ROM Mild rhonchi   Lab Results  Component Value Date   WBC 10.1 09/21/2017   HGB 13.9 09/21/2017   HCT 41.2 09/21/2017   PLT 352.0 09/21/2017   GLUCOSE 95 09/21/2017   CHOL 239 (H) 09/21/2017   TRIG 116.0 09/21/2017   HDL 62.00 09/21/2017   LDLDIRECT 165.0 09/26/2009   LDLCALC 154 (H) 09/21/2017   ALT 11 09/21/2017   AST 14 09/21/2017   NA 140 09/21/2017   K 5.0 09/21/2017   CL 100 09/21/2017   CREATININE 0.70 09/21/2017   BUN 7 09/21/2017   CO2 34 (H) 09/21/2017   TSH 5.59 (H) 09/21/2017   INR 1.0 ratio 11/07/2009    Dg Chest 2 View  Result Date: 01/03/2018 CLINICAL DATA:  Wheezing AND cough, intermittent 1.5 wks, HTN, hx of bronchiectasis. EXAM: CHEST - 2 VIEW COMPARISON:  01/28/2017 FINDINGS: Heart size is normal. There is atherosclerotic calcification of the thoracic aorta. The lungs are free of focal consolidations and pleural effusions. No pulmonary edema. Surgical clips are present in the UPPER abdomen.  Visualized osseous structures have a normal appearance. IMPRESSION: No evidence for acute cardiopulmonary abnormality. Aortic atherosclerosis. (ICD10-I70.0) Electronically Signed   By: Nolon Nations M.D.   On: 01/03/2018 16:20    Assessment & Plan:   There are no diagnoses linked to this encounter.   No orders of the defined types were placed in this encounter.    Follow-up: No follow-ups on file.  Walker Kehr, MD

## 2018-04-11 NOTE — Assessment & Plan Note (Signed)
F/u w/Dr Croitoru No CP

## 2018-04-11 NOTE — Assessment & Plan Note (Signed)
X ray R hip

## 2018-04-11 NOTE — Assessment & Plan Note (Signed)
On Lexapro Vraylar low dose

## 2018-04-11 NOTE — Assessment & Plan Note (Signed)
On Levothroid 

## 2018-04-17 ENCOUNTER — Ambulatory Visit (INDEPENDENT_AMBULATORY_CARE_PROVIDER_SITE_OTHER): Payer: Medicare Other | Admitting: Psychology

## 2018-04-17 DIAGNOSIS — F331 Major depressive disorder, recurrent, moderate: Secondary | ICD-10-CM

## 2018-05-01 ENCOUNTER — Ambulatory Visit (INDEPENDENT_AMBULATORY_CARE_PROVIDER_SITE_OTHER): Payer: Medicare Other | Admitting: Psychology

## 2018-05-01 DIAGNOSIS — F331 Major depressive disorder, recurrent, moderate: Secondary | ICD-10-CM | POA: Diagnosis not present

## 2018-05-08 ENCOUNTER — Telehealth: Payer: Self-pay | Admitting: Internal Medicine

## 2018-05-08 NOTE — Telephone Encounter (Signed)
Copied from [place] [id number]. Topic: Quick Communication - Rx Refill/Question >> May 08, 2018  1:49 PM Margot Ables wrote: Medication: cariprazine (VRAYLAR) capsule - pt states she is needing this to be ordered for her thru the patient assistance program to be delivered to the practice - she will pick up once it is received at the office - she is on the last bottle - takes 1/day - please advise.

## 2018-05-09 ENCOUNTER — Other Ambulatory Visit: Payer: Self-pay | Admitting: Pharmacy Technician

## 2018-05-09 NOTE — Patient Outreach (Signed)
[facility]) Care Management  05/09/2018  ANASOFIA MICALLEF 1950/12/17 [id number]    Successful call placed to patient regarding patient assistance medication receipt from [facility], HIPAA identifiers verified. Veronica [name] confirms that she received her refill of Dilantin from [employer]. She states hat her providers office helps hr obtain her refills. Requested that the contact us [facility]) if she runs into any issues getting refills in the future or if she needs help with anything else that may come up. She states she really appreciates our help and our phone calls!!   Will route note to [place] for case closure.  Maud Deed Chana Bode Maceo Certified Pharmacy Technician [place] Management Direct Dial:[phone]

## 2018-05-12 ENCOUNTER — Ambulatory Visit: Payer: Self-pay | Admitting: Pharmacy Technician

## 2018-05-12 NOTE — Telephone Encounter (Signed)
Med ordered

## 2018-05-15 ENCOUNTER — Ambulatory Visit (INDEPENDENT_AMBULATORY_CARE_PROVIDER_SITE_OTHER): Payer: Medicare Other | Admitting: Psychology

## 2018-05-15 DIAGNOSIS — F331 Major depressive disorder, recurrent, moderate: Secondary | ICD-10-CM | POA: Diagnosis not present

## 2018-05-16 ENCOUNTER — Other Ambulatory Visit: Payer: Self-pay | Admitting: Pharmacist

## 2018-05-16 NOTE — Patient Outreach (Signed)
[facility]) Care Management  05/16/2018  Veronica [name] September 07, 1950 [id number]  Received message from pharmacy technician Caryl Pina that patient has received patient assistance supplied Dilantin (brand name) and has no further pharmacy needs at this time.   Plan:  Pharmacy case closed.    Karrie Meres, PharmD, [place] [phone]

## 2018-05-22 NOTE — Telephone Encounter (Signed)
Pt called in to check the status of request. Pt says that she still hasn't received a call about her medication. Advised pt that Med was ordered. Pt would like further assistance with this.

## 2018-05-22 NOTE — Telephone Encounter (Signed)
Pt notified we have not received RX but will call when we receive it.

## 2018-05-29 ENCOUNTER — Ambulatory Visit (INDEPENDENT_AMBULATORY_CARE_PROVIDER_SITE_OTHER): Payer: Medicare Other | Admitting: Psychology

## 2018-05-29 ENCOUNTER — Other Ambulatory Visit: Payer: Self-pay

## 2018-05-29 DIAGNOSIS — F331 Major depressive disorder, recurrent, moderate: Secondary | ICD-10-CM

## 2018-06-08 ENCOUNTER — Other Ambulatory Visit: Payer: Self-pay | Admitting: Internal Medicine

## 2018-06-08 MED ORDER — PROMETHAZINE-CODEINE 6.25-10 MG/5ML PO SYRP: 5.0000 mL | ORAL_SOLUTION | Freq: Four times a day (QID) | ORAL | 1 refills | Status: DC | PRN

## 2018-06-08 NOTE — Telephone Encounter (Signed)
Pt picked up medication and asked for a refill on her cough medication. Please advise

## 2018-06-08 NOTE — Addendum Note (Signed)
Addended by: Karren Cobble on: 06/08/2018 09:12 AM   Modules accepted: Orders

## 2018-06-08 NOTE — Telephone Encounter (Signed)
Patient left a message about the status of her cariprazine (VRAYLAR) capsule medication assistance documents. Please reach out to patient [phone] (mobile)

## 2018-06-12 ENCOUNTER — Ambulatory Visit (INDEPENDENT_AMBULATORY_CARE_PROVIDER_SITE_OTHER): Payer: Medicare Other | Admitting: Psychology

## 2018-06-12 DIAGNOSIS — F331 Major depressive disorder, recurrent, moderate: Secondary | ICD-10-CM

## 2018-06-26 ENCOUNTER — Ambulatory Visit (INDEPENDENT_AMBULATORY_CARE_PROVIDER_SITE_OTHER): Payer: Medicare Other | Admitting: Psychology

## 2018-06-26 DIAGNOSIS — F331 Major depressive disorder, recurrent, moderate: Secondary | ICD-10-CM

## 2018-06-29 ENCOUNTER — Ambulatory Visit (INDEPENDENT_AMBULATORY_CARE_PROVIDER_SITE_OTHER): Payer: Medicare Other | Admitting: Internal Medicine

## 2018-06-29 ENCOUNTER — Other Ambulatory Visit: Payer: Self-pay

## 2018-06-29 ENCOUNTER — Ambulatory Visit (INDEPENDENT_AMBULATORY_CARE_PROVIDER_SITE_OTHER)
Admission: RE | Admit: 2018-06-29 | Discharge: 2018-06-29 | Disposition: A | Discharge status: Home / Self Care | Payer: Medicare Other | Source: Ambulatory Visit | Attending: Internal Medicine | Admitting: Internal Medicine

## 2018-06-29 ENCOUNTER — Encounter: Payer: Self-pay | Admitting: Internal Medicine

## 2018-06-29 DIAGNOSIS — J479 Bronchiectasis, uncomplicated: Secondary | ICD-10-CM

## 2018-06-29 DIAGNOSIS — F419 Anxiety disorder, unspecified: Secondary | ICD-10-CM

## 2018-06-29 DIAGNOSIS — I25111 Atherosclerotic heart disease of native coronary artery with angina pectoris with documented spasm: Secondary | ICD-10-CM

## 2018-06-29 DIAGNOSIS — J42 Unspecified chronic bronchitis: Secondary | ICD-10-CM | POA: Diagnosis not present

## 2018-06-29 DIAGNOSIS — I10 Essential (primary) hypertension: Secondary | ICD-10-CM

## 2018-06-29 DIAGNOSIS — B354 Tinea corporis: Secondary | ICD-10-CM

## 2018-06-29 MED ORDER — KETOCONAZOLE 2 % EX CREA: 1.0000 "application " | TOPICAL_CREAM | Freq: Two times a day (BID) | CUTANEOUS | 1 refills | Status: AC

## 2018-06-29 MED ORDER — LEVOFLOXACIN 500 MG PO TABS: 500.0000 mg | ORAL_TABLET | Freq: Every day | ORAL | 0 refills | Status: AC

## 2018-06-29 NOTE — Assessment & Plan Note (Addendum)
Worse levaquin po CXR

## 2018-06-29 NOTE — Assessment & Plan Note (Signed)
Trazodone  °

## 2018-06-29 NOTE — Assessment & Plan Note (Signed)
R breast fold Ketocon cream

## 2018-06-29 NOTE — Progress Notes (Signed)
Subjective:  Patient ID: Veronica [name], female    DOB: September 07, 1950  Age: 68 y.o. MRN: [id number]  CC: No chief complaint on file.   HPI Veronica [name] presents for productive cough x 1.5 weeks C/o allergies C/o loose stools  Outpatient Medications Prior to Visit  Medication Sig Dispense Refill  . acetaminophen-codeine (TYLENOL #3) 300-30 MG tablet Take 1 tablet by mouth every 4 (four) hours as needed. 60 tablet 3  . albuterol (PROAIR HFA) 108 (90 Base) MCG/ACT inhaler Inhale 1-2 puffs into the lungs every 4 (four) hours as needed for wheezing or shortness of breath. 18 g 5  . albuterol (PROVENTIL) (2.5 MG/3ML) 0.083% nebulizer solution USE 1 VIAL IN NEBULIZER 2 TIMES DAILY. Generic: VENTOLIN 60 vial 5  . aspirin 81 MG chewable tablet Chew by mouth daily.    [name] atorvastatin (LIPITOR) 40 MG tablet TAKE 1 TABLET(40 MG) BY MOUTH DAILY 90 tablet 3  . budesonide (PULMICORT) 0.25 MG/2ML nebulizer solution USE 1 VIAL IN NEBULIZER 2 TIMES DAILY. Generic: PULMICORT 120 mL 2  . cariprazine (VRAYLAR) capsule Take 1 capsule (1.5 mg total) by mouth daily. 90 capsule 3  . cetirizine (ZYRTEC) 5 MG tablet Take 1 tablet (5 mg total) by mouth daily. 30 tablet 1  . Cholecalciferol 1000 units tablet Take 1,000 Units by mouth daily.    . clonazePAM (KLONOPIN) 1 MG tablet 2 po qhs and 1/2 tab in addition in daytime prn 225 tablet 3  . cyanocobalamin (,VITAMIN B-12,) 1000 MCG/ML injection Inject 1 mL (1,000 mcg total) into the muscle every 14 (fourteen) days. 10 mL 3  . cyclobenzaprine (FLEXERIL) 10 MG tablet Take 1 tablet (10 mg total) by mouth 3 (three) times daily as needed for muscle spasms. 90 tablet 0  . diphenoxylate-atropine (LOMOTIL) 2.5-0.025 MG tablet Take 1-2 tablets by mouth 4 (four) times daily as needed for diarrhea or loose stools. 30 tablet 0  . Docusate Calcium (STOOL SOFTENER PO) Take 100 mg by mouth as needed (for constipation).     [name] escitalopram (LEXAPRO) 20 MG tablet Take 1 tablet (20  mg total) by mouth daily. 90 tablet 3  . formoterol (PERFOROMIST) 20 MCG/2ML nebulizer solution Take 2 mLs (20 mcg total) by nebulization 2 (two) times daily. 120 mL 3  . furosemide (LASIX) 20 MG tablet Take 1 tablet (20 mg total) by mouth daily as needed. 90 tablet 3  . hydrochlorothiazide (MICROZIDE) 12.5 MG capsule Take 1 capsule (12.5 mg total) by mouth 3 (three) times a week. Mondays, Wednesdays, and Fridays 45 capsule 3  . Insulin Syringe-Needle U-100 (B-D INSULIN SYRINGE 1CC/26G) 26G X 1/2" 1 ML MISC Inject 1 each into the skin as directed. 100 each 5  . levothyroxine (SYNTHROID, LEVOTHROID) 88 MCG tablet Take 1 tablet (88 mcg total) by mouth daily. 90 tablet 3  . magic mouthwash SOLN Take 5 mLs by mouth 4 (four) times daily. Swish, hold and spit out---Do  Not swallow.  100 ml of dexamethasone 0.5 mg per 5 ml elixir 60 ml nystatin 100,000 Unit 100 ml of diphenhydramine 12.5 mg per 5 ml elixir 260 mL 3  . nitroGLYCERIN (NITROSTAT) 0.4 MG SL tablet PLACE 1 TABLET UNDER THE TONGUE EVERY 5 MINUTES AS NEEDED FOR CHEST PAIN 25 tablet 3  . ondansetron (ZOFRAN) 4 MG tablet Take 1 tablet (4 mg total) by mouth every 8 (eight) hours as needed for nausea or vomiting. 21 tablet 0  . phenytoin (DILANTIN) 100 MG ER capsule 200 mg  every morning and 100 mg every evening, DILANTIN "brand name" only 270 capsule 3  . promethazine (PHENERGAN) 25 MG tablet Take 0.5 tablets (12.5 mg total) by mouth every 6 (six) hours as needed. 60 tablet 1  . promethazine-codeine (PHENERGAN WITH CODEINE) 6.25-10 MG/5ML syrup Take 5 mLs by mouth every 6 (six) hours as needed for cough. 300 mL 1  . Respiratory Therapy Supplies (FLUTTER) DEVI Use after nebulization treatments 1 each 0  . sodium chloride (MURO 128) 5 % ophthalmic solution Place 1 drop into both eyes as needed for irritation.     . sodium chloride HYPERTONIC 3 % nebulizer solution USE 1 VIAL VIA NEBULIZER TWICE DAILY 240 mL 2  . traZODone (DESYREL) 50 MG tablet Take  4 tablets (200 mg total) by mouth at bedtime. 360 tablet 3  . verapamil (CALAN-SR) 240 MG CR tablet Take 1 tablet (240 mg total) by mouth at bedtime. 90 tablet 3   No facility-administered medications prior to visit.     ROS: Review of Systems  Constitutional: Positive for fatigue. Negative for activity change, appetite change, chills and unexpected weight change.  HENT: Positive for congestion. Negative for mouth sores and sinus pressure.   Eyes: Negative for visual disturbance.  Respiratory: Positive for cough. Negative for chest tightness.   Gastrointestinal: Positive for diarrhea. Negative for abdominal pain and nausea.  Genitourinary: Negative for difficulty urinating, frequency and vaginal pain.  Musculoskeletal: Negative for back pain and gait problem.  Skin: Positive for rash. Negative for pallor.  Neurological: Negative for dizziness, tremors, weakness, numbness and headaches.  Psychiatric/Behavioral: Negative for confusion, sleep disturbance and suicidal ideas.    Objective:  BP (!) 144/72 (BP Location: Left Arm, Patient Position: Sitting, Cuff Size: Normal)   Pulse 73   Temp 98.1 F (36.7 C) (Oral)   Ht 5\' 1"  (1.549 m)   Wt 149 lb (67.6 kg)   SpO2 96%   BMI 28.15 kg/m   BP Readings from Last 3 Encounters:  06/29/18 (!) 144/72  04/11/18 136/84  02/21/18 138/82    Wt Readings from Last 3 Encounters:  06/29/18 149 lb (67.6 kg)  04/11/18 146 lb (66.2 kg)  02/21/18 144 lb (65.3 kg)    Physical Exam Constitutional:      General: She is not in acute distress.    Appearance: She is well-developed.  HENT:     Head: Normocephalic.     Right Ear: External ear normal.     Left Ear: External ear normal.     Nose: Nose normal.  Eyes:     General:        Right eye: No discharge.        Left eye: No discharge.     Conjunctiva/sclera: Conjunctivae normal.     Pupils: Pupils are equal, round, and reactive to light.  Neck:     Musculoskeletal: Normal range of motion  and neck supple.     Thyroid: No thyromegaly.     Vascular: No JVD.     Trachea: No tracheal deviation.  Cardiovascular:     Rate and Rhythm: Normal rate and regular rhythm.     Heart sounds: Normal heart sounds.  Pulmonary:     Effort: No respiratory distress.     Breath sounds: No stridor. No wheezing.  Abdominal:     General: Bowel sounds are normal. There is no distension.     Palpations: Abdomen is soft. There is no mass.     Tenderness: There is no  abdominal tenderness. There is no guarding or rebound.  Musculoskeletal:        General: No tenderness.  Lymphadenopathy:     Cervical: No cervical adenopathy.  Skin:    Findings: No erythema or rash.  Neurological:     Cranial Nerves: No cranial nerve deficit.     Motor: No abnormal muscle tone.     Coordination: Coordination normal.     Deep Tendon Reflexes: Reflexes normal.  Psychiatric:        Behavior: Behavior normal.        Thought Content: Thought content normal.        Judgment: Judgment normal.   brown sputum coughing Rash under R breast  Lab Results  Component Value Date   WBC 10.1 09/21/2017   HGB 13.9 09/21/2017   HCT 41.2 09/21/2017   PLT 352.0 09/21/2017   GLUCOSE 95 09/21/2017   CHOL 239 (H) 09/21/2017   TRIG 116.0 09/21/2017   HDL 62.00 09/21/2017   LDLDIRECT 165.0 09/26/2009   LDLCALC 154 (H) 09/21/2017   ALT 11 09/21/2017   AST 14 09/21/2017   NA 140 09/21/2017   K 5.0 09/21/2017   CL 100 09/21/2017   CREATININE 0.70 09/21/2017   BUN 7 09/21/2017   CO2 34 (H) 09/21/2017   TSH 5.59 (H) 09/21/2017   INR 1.0 ratio 11/07/2009    Dg Hip Unilat With Pelvis 2-3 Views Right  Result Date: 04/11/2018 CLINICAL DATA:  Fall 3 weeks ago.  Right hip pain. EXAM: DG HIP (WITH OR WITHOUT PELVIS) 2-3V RIGHT COMPARISON:  None. 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 SI joints symmetric and unremarkable. No acute bony abnormality. Specifically, no fracture, subluxation, or dislocation.  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 No acute bony abnormality. Electronically Signed   By: Rolm Baptise M.D.   On: 04/11/2018 19:37    Assessment & Plan:   There are no diagnoses linked to this encounter.   No orders of the defined types were placed in this encounter.    Follow-up: No follow-ups on file.  Walker Kehr, MD

## 2018-06-29 NOTE — Assessment & Plan Note (Signed)
Calan SR 

## 2018-06-29 NOTE — Assessment & Plan Note (Signed)
Levaquin po

## 2018-06-29 NOTE — Patient Instructions (Signed)
Take a probiotic 

## 2018-07-10 ENCOUNTER — Ambulatory Visit (INDEPENDENT_AMBULATORY_CARE_PROVIDER_SITE_OTHER): Payer: Medicare Other | Admitting: Psychology

## 2018-07-10 DIAGNOSIS — F331 Major depressive disorder, recurrent, moderate: Secondary | ICD-10-CM | POA: Diagnosis not present

## 2018-07-13 ENCOUNTER — Ambulatory Visit: Payer: Self-pay | Admitting: Internal Medicine

## 2018-07-24 ENCOUNTER — Ambulatory Visit (INDEPENDENT_AMBULATORY_CARE_PROVIDER_SITE_OTHER): Payer: Medicare Other | Admitting: Psychology

## 2018-07-24 DIAGNOSIS — F331 Major depressive disorder, recurrent, moderate: Secondary | ICD-10-CM | POA: Diagnosis not present

## 2018-07-26 ENCOUNTER — Other Ambulatory Visit: Payer: Self-pay | Admitting: Internal Medicine

## 2018-07-26 ENCOUNTER — Telehealth: Payer: Self-pay | Admitting: Internal Medicine

## 2018-07-26 MED ORDER — PROMETHAZINE-CODEINE 6.25-10 MG/5ML PO SYRP: 5.0000 mL | ORAL_SOLUTION | Freq: Four times a day (QID) | ORAL | 0 refills | Status: DC | PRN

## 2018-07-26 NOTE — Telephone Encounter (Signed)
Copied from [place] [id number]. Topic: Quick Communication - Rx Refill/Question >> Jul 26, 2018  8:36 AM [name] wrote: Medication: acetaminophen-codeine (TYLENOL #3) 300-30 MG tablet  Has the patient contacted their pharmacy? Yes - needs new refill and would like it to have at least one refill (Agent: If no, request that the patient contact the pharmacy for the refill.) (Agent: If yes, when and what did the pharmacy advise?)  Preferred Pharmacy (with phone number or street name): [id number] - [place], [place] [phone] (Phone) [phone] (Fax)  Agent: Please be advised that RX refills may take up to 3 business days. We ask that you follow-up with your pharmacy.

## 2018-07-26 NOTE — Telephone Encounter (Signed)
Copied from [place] [id number]. Topic: Quick Communication - Rx Refill/Question >> Jul 26, 2018  8:44 AM Burchel, Abbi R wrote: Medication: promethazine-codeine (PHENERGAN WITH CODEINE) 6.25-10 MG/5ML syrup  Pt does NOT need acetaminophen-codeine (TYLENOL #3) 300-30 MG tablet, she was mistaken.  She needs this rx INSTEAD.   Preferred Pharmacy: [place], [place] - [place]  [phone] (Phone) [phone] (Fax)   Pt was advised that RX refills may take up to 3 business days. We ask that you follow-up with your pharmacy.

## 2018-07-26 NOTE — Telephone Encounter (Signed)
Please advise about refill in Dr. Plotnikovs absence. 

## 2018-07-26 NOTE — Addendum Note (Signed)
Addended by: Karren Cobble on: 07/26/2018 11:13 AM   Modules accepted: Orders

## 2018-07-26 NOTE — Telephone Encounter (Signed)
Done erx 

## 2018-07-26 NOTE — Telephone Encounter (Signed)
Pt called back to state that she does not need this rx, she needed promethazine-codeine (PHENERGAN WITH CODEINE) 6.25-10 MG/5ML syrup instead.  Rx request sent for promethazine-codeine (PHENERGAN WITH CODEINE) 6.25-10 MG/5ML syrup. Please disregard this request.

## 2018-07-26 NOTE — Addendum Note (Signed)
Addended by: Biagio Borg on: 07/26/2018 12:22 PM   Modules accepted: Orders

## 2018-08-08 ENCOUNTER — Ambulatory Visit (INDEPENDENT_AMBULATORY_CARE_PROVIDER_SITE_OTHER): Payer: Medicare Other | Admitting: Psychology

## 2018-08-08 DIAGNOSIS — F331 Major depressive disorder, recurrent, moderate: Secondary | ICD-10-CM | POA: Diagnosis not present

## 2018-08-17 ENCOUNTER — Telehealth: Payer: Self-pay

## 2018-08-17 NOTE — Telephone Encounter (Signed)
Patient is requesting refill on Vraylar----and she says it is patient assistance program----routing to betty for dr plotnikov, do you have information you need to reorder?  thanks

## 2018-08-18 ENCOUNTER — Telehealth: Payer: Self-pay | Admitting: Internal Medicine

## 2018-08-18 NOTE — Telephone Encounter (Signed)
Copied from [place] [phone]. Topic: Quick Communication - Rx Refill/Question >> Aug 18, 2018  9:00 AM [name] wrote: Medication:  cariprazine (VRAYLAR) capsule  Pt states that she needs [name] to order this medication through Patient Assistance. Pt can be reached at [phone].

## 2018-08-18 NOTE — Telephone Encounter (Signed)
Duplicate request. Pt called yesterday msg is given to [name] which she is out today. She will order med when she return bacl on Monday. Closing this duplicate.Marland KitchenJohny Chess

## 2018-08-21 ENCOUNTER — Ambulatory Visit (INDEPENDENT_AMBULATORY_CARE_PROVIDER_SITE_OTHER): Payer: Medicare Other | Admitting: Psychology

## 2018-08-21 DIAGNOSIS — F331 Major depressive disorder, recurrent, moderate: Secondary | ICD-10-CM

## 2018-08-22 NOTE — Telephone Encounter (Signed)
RX ordered.  order #[id number]

## 2018-08-29 ENCOUNTER — Ambulatory Visit (INDEPENDENT_AMBULATORY_CARE_PROVIDER_SITE_OTHER): Payer: Medicare Other | Admitting: Internal Medicine

## 2018-08-29 ENCOUNTER — Encounter: Payer: Self-pay | Admitting: Internal Medicine

## 2018-08-29 ENCOUNTER — Other Ambulatory Visit: Payer: Self-pay

## 2018-08-29 VITALS — BP 138/76 | HR 62 | Temp 98.1°F | Ht 61.0 in | Wt 152.0 lb

## 2018-08-29 DIAGNOSIS — J449 Chronic obstructive pulmonary disease, unspecified: Secondary | ICD-10-CM

## 2018-08-29 DIAGNOSIS — J4489 Other specified chronic obstructive pulmonary disease: Secondary | ICD-10-CM

## 2018-08-29 DIAGNOSIS — J439 Emphysema, unspecified: Secondary | ICD-10-CM | POA: Diagnosis not present

## 2018-08-29 DIAGNOSIS — E559 Vitamin D deficiency, unspecified: Secondary | ICD-10-CM

## 2018-08-29 DIAGNOSIS — I25111 Atherosclerotic heart disease of native coronary artery with angina pectoris with documented spasm: Secondary | ICD-10-CM | POA: Diagnosis not present

## 2018-08-29 DIAGNOSIS — E539 Vitamin B deficiency, unspecified: Secondary | ICD-10-CM

## 2018-08-29 DIAGNOSIS — I10 Essential (primary) hypertension: Secondary | ICD-10-CM

## 2018-08-29 DIAGNOSIS — R635 Abnormal weight gain: Secondary | ICD-10-CM

## 2018-08-29 DIAGNOSIS — E538 Deficiency of other specified B group vitamins: Secondary | ICD-10-CM | POA: Diagnosis not present

## 2018-08-29 DIAGNOSIS — M810 Age-related osteoporosis without current pathological fracture: Secondary | ICD-10-CM | POA: Diagnosis not present

## 2018-08-29 DIAGNOSIS — L509 Urticaria, unspecified: Secondary | ICD-10-CM

## 2018-08-29 DIAGNOSIS — E890 Postprocedural hypothyroidism: Secondary | ICD-10-CM

## 2018-08-29 DIAGNOSIS — E875 Hyperkalemia: Secondary | ICD-10-CM

## 2018-08-29 DIAGNOSIS — F329 Major depressive disorder, single episode, unspecified: Secondary | ICD-10-CM

## 2018-08-29 MED ORDER — ACETAMINOPHEN-CODEINE #3 300-30 MG PO TABS: 1.0000 | ORAL_TABLET | ORAL | 3 refills | Status: DC | PRN

## 2018-08-29 MED ORDER — PROMETHAZINE-CODEINE 6.25-10 MG/5ML PO SYRP: 5.0000 mL | ORAL_SOLUTION | Freq: Four times a day (QID) | ORAL | 1 refills | Status: DC | PRN

## 2018-08-29 MED ORDER — CYANOCOBALAMIN 1000 MCG/ML IJ SOLN
1000.0000 ug | Freq: Once | INTRAMUSCULAR | Status: AC
  Administered 2018-08-29: 1000 ug via INTRAMUSCULAR

## 2018-08-29 MED ORDER — DENOSUMAB 60 MG/ML ~~LOC~~ SOSY
60.0000 mg | PREFILLED_SYRINGE | Freq: Once | SUBCUTANEOUS | Status: AC
  Administered 2018-08-29: 60 mg via SUBCUTANEOUS

## 2018-08-29 NOTE — Assessment & Plan Note (Signed)
Wt Readings from Last 3 Encounters:  08/29/18 152 lb (68.9 kg)  06/29/18 149 lb (67.6 kg)  04/11/18 146 lb (66.2 kg)

## 2018-08-29 NOTE — Assessment & Plan Note (Signed)
Levothroid 88 mcg

## 2018-08-29 NOTE — Assessment & Plan Note (Signed)
[facility] for cough prn  Potential benefits of a long term opioids use as well as potential risks (i.e. addiction risk, apnea etc) and complications (i.e. Somnolence, constipation and others) were explained to the patient and were aknowledged.

## 2018-08-29 NOTE — Assessment & Plan Note (Signed)
Calan SR 

## 2018-08-29 NOTE — Progress Notes (Signed)
Subjective:  Patient ID: Veronica [name], female    DOB: February 01, 1951  Age: 68 y.o. MRN: [id number]  CC: No chief complaint on file.   HPI Veronica [name] presents for COPD, osteoporosis, dyslipidemia f/u  Outpatient Medications Prior to Visit  Medication Sig Dispense Refill  . acetaminophen-codeine (TYLENOL #3) 300-30 MG tablet Take 1 tablet by mouth every 4 (four) hours as needed. 60 tablet 3  . albuterol (PROAIR HFA) 108 (90 Base) MCG/ACT inhaler Inhale 1-2 puffs into the lungs every 4 (four) hours as needed for wheezing or shortness of breath. 18 g 5  . albuterol (PROVENTIL) (2.5 MG/3ML) 0.083% nebulizer solution USE 1 VIAL IN NEBULIZER 2 TIMES DAILY. Generic: VENTOLIN 60 vial 5  . aspirin 81 MG chewable tablet Chew by mouth daily.    [name] atorvastatin (LIPITOR) 40 MG tablet TAKE 1 TABLET(40 MG) BY MOUTH DAILY 90 tablet 3  . budesonide (PULMICORT) 0.25 MG/2ML nebulizer solution USE 1 VIAL IN NEBULIZER 2 TIMES DAILY. Generic: PULMICORT 120 mL 2  . cariprazine (VRAYLAR) capsule Take 1 capsule (1.5 mg total) by mouth daily. 90 capsule 3  . cetirizine (ZYRTEC) 5 MG tablet Take 1 tablet (5 mg total) by mouth daily. 30 tablet 1  . Cholecalciferol 1000 units tablet Take 1,000 Units by mouth daily.    . clonazePAM (KLONOPIN) 1 MG tablet 2 po qhs and 1/2 tab in addition in daytime prn 225 tablet 3  . cyanocobalamin (,VITAMIN B-12,) 1000 MCG/ML injection Inject 1 mL (1,000 mcg total) into the muscle every 14 (fourteen) days. 10 mL 3  . cyclobenzaprine (FLEXERIL) 10 MG tablet Take 1 tablet (10 mg total) by mouth 3 (three) times daily as needed for muscle spasms. 90 tablet 0  . diphenoxylate-atropine (LOMOTIL) 2.5-0.025 MG tablet Take 1-2 tablets by mouth 4 (four) times daily as needed for diarrhea or loose stools. 30 tablet 0  . Docusate Calcium (STOOL SOFTENER PO) Take 100 mg by mouth as needed (for constipation).     [name] escitalopram (LEXAPRO) 20 MG tablet Take 1 tablet (20 mg total) by mouth daily.  90 tablet 3  . formoterol (PERFOROMIST) 20 MCG/2ML nebulizer solution Take 2 mLs (20 mcg total) by nebulization 2 (two) times daily. 120 mL 3  . furosemide (LASIX) 20 MG tablet Take 1 tablet (20 mg total) by mouth daily as needed. 90 tablet 3  . hydrochlorothiazide (MICROZIDE) 12.5 MG capsule Take 1 capsule (12.5 mg total) by mouth 3 (three) times a week. Mondays, Wednesdays, and Fridays 45 capsule 3  . Insulin Syringe-Needle U-100 (B-D INSULIN SYRINGE 1CC/26G) 26G X 1/2" 1 ML MISC Inject 1 each into the skin as directed. 100 each 5  . ketoconazole (NIZORAL) 2 % cream Apply 1 application topically 2 (two) times daily. 45 g 1  . levothyroxine (SYNTHROID, LEVOTHROID) 88 MCG tablet Take 1 tablet (88 mcg total) by mouth daily. 90 tablet 3  . magic mouthwash SOLN Take 5 mLs by mouth 4 (four) times daily. Swish, hold and spit out---Do  Not swallow.  100 ml of dexamethasone 0.5 mg per 5 ml elixir 60 ml nystatin 100,000 Unit 100 ml of diphenhydramine 12.5 mg per 5 ml elixir 260 mL 3  . nitroGLYCERIN (NITROSTAT) 0.4 MG SL tablet PLACE 1 TABLET UNDER THE TONGUE EVERY 5 MINUTES AS NEEDED FOR CHEST PAIN 25 tablet 3  . ondansetron (ZOFRAN) 4 MG tablet Take 1 tablet (4 mg total) by mouth every 8 (eight) hours as needed for nausea or vomiting. 21  tablet 0  . phenytoin (DILANTIN) 100 MG ER capsule 200 mg every morning and 100 mg every evening, DILANTIN "brand name" only 270 capsule 3  . promethazine (PHENERGAN) 25 MG tablet Take 0.5 tablets (12.5 mg total) by mouth every 6 (six) hours as needed. 60 tablet 1  . promethazine-codeine (PHENERGAN WITH CODEINE) 6.25-10 MG/5ML syrup Take 5 mLs by mouth every 6 (six) hours as needed for cough. 300 mL 0  . Respiratory Therapy Supplies (FLUTTER) DEVI Use after nebulization treatments 1 each 0  . sodium chloride (MURO 128) 5 % ophthalmic solution Place 1 drop into both eyes as needed for irritation.     . sodium chloride HYPERTONIC 3 % nebulizer solution USE 1 VIAL VIA  NEBULIZER TWICE DAILY 240 mL 2  . traZODone (DESYREL) 50 MG tablet Take 4 tablets (200 mg total) by mouth at bedtime. 360 tablet 3  . verapamil (CALAN-SR) 240 MG CR tablet Take 1 tablet (240 mg total) by mouth at bedtime. 90 tablet 3   No facility-administered medications prior to visit.     ROS: Review of Systems  Constitutional: Positive for fatigue. Negative for activity change, appetite change, chills, fever and unexpected weight change.  HENT: Negative for congestion, mouth sores and sinus pressure.   Eyes: Negative for visual disturbance.  Respiratory: Positive for cough and shortness of breath. Negative for chest tightness.   Gastrointestinal: Negative for abdominal pain and nausea.  Genitourinary: Negative for difficulty urinating, frequency and vaginal pain.  Musculoskeletal: Positive for arthralgias and back pain. Negative for gait problem.  Skin: Negative for pallor and rash.  Neurological: Positive for weakness. Negative for dizziness, tremors, numbness and headaches.  Psychiatric/Behavioral: Negative for confusion, sleep disturbance and suicidal ideas. The patient is nervous/anxious.     Objective:  BP 138/76 (BP Location: Left Arm, Patient Position: Sitting, Cuff Size: Normal)   Pulse 62   Temp 98.1 F (36.7 C) (Oral)   Ht 5\' 1"  (1.549 m)   Wt 152 lb (68.9 kg)   SpO2 98%   BMI 28.72 kg/m   BP Readings from Last 3 Encounters:  08/29/18 138/76  06/29/18 (!) 144/72  04/11/18 136/84    Wt Readings from Last 3 Encounters:  08/29/18 152 lb (68.9 kg)  06/29/18 149 lb (67.6 kg)  04/11/18 146 lb (66.2 kg)    Physical Exam Constitutional:      General: She is not in acute distress.    Appearance: She is well-developed.  HENT:     Head: Normocephalic.     Right Ear: Tympanic membrane and external ear normal.     Left Ear: Tympanic membrane and external ear normal.     Nose: Nose normal.  Eyes:     General:        Right eye: No discharge.        Left eye: No  discharge.     Conjunctiva/sclera: Conjunctivae normal.     Pupils: Pupils are equal, round, and reactive to light.  Neck:     Musculoskeletal: Normal range of motion and neck supple.     Thyroid: No thyromegaly.     Vascular: No JVD.     Trachea: No tracheal deviation.  Cardiovascular:     Rate and Rhythm: Normal rate and regular rhythm.     Heart sounds: Normal heart sounds.  Pulmonary:     Effort: No respiratory distress.     Breath sounds: No stridor. No wheezing.  Abdominal:     General: Bowel sounds  are normal. There is no distension.     Palpations: Abdomen is soft. There is no mass.     Tenderness: There is no abdominal tenderness. There is no guarding or rebound.  Musculoskeletal:        General: No tenderness.  Lymphadenopathy:     Cervical: No cervical adenopathy.  Skin:    Findings: No erythema or rash.  Neurological:     Mental Status: She is alert.     Cranial Nerves: No cranial nerve deficit.     Motor: Weakness present. No abnormal muscle tone.     Coordination: Coordination abnormal.     Gait: Gait abnormal.     Deep Tendon Reflexes: Reflexes normal.  Psychiatric:        Behavior: Behavior normal.        Thought Content: Thought content normal.        Judgment: Judgment normal.   cane Crackles at bases   Lab Results  Component Value Date   WBC 10.1 09/21/2017   HGB 13.9 09/21/2017   HCT 41.2 09/21/2017   PLT 352.0 09/21/2017   GLUCOSE 95 09/21/2017   CHOL 239 (H) 09/21/2017   TRIG 116.0 09/21/2017   HDL 62.00 09/21/2017   LDLDIRECT 165.0 09/26/2009   LDLCALC 154 (H) 09/21/2017   ALT 11 09/21/2017   AST 14 09/21/2017   NA 140 09/21/2017   K 5.0 09/21/2017   CL 100 09/21/2017   CREATININE 0.70 09/21/2017   BUN 7 09/21/2017   CO2 34 (H) 09/21/2017   TSH 5.59 (H) 09/21/2017   INR 1.0 ratio 11/07/2009    No results found.  Assessment & Plan:   There are no diagnoses linked to this encounter.   No orders of the defined types were  placed in this encounter.    Follow-up: No follow-ups on file.  Walker Kehr, MD

## 2018-08-29 NOTE — Assessment & Plan Note (Signed)
On Lexapro, Vraylar low dose

## 2018-08-29 NOTE — Assessment & Plan Note (Signed)
On B12 

## 2018-08-29 NOTE — Assessment & Plan Note (Signed)
Lipitor 1/2 qod

## 2018-08-29 NOTE — Addendum Note (Signed)
Addended by: Karren Cobble on: 08/29/2018 02:07 PM   Modules accepted: Orders

## 2018-09-04 ENCOUNTER — Ambulatory Visit (INDEPENDENT_AMBULATORY_CARE_PROVIDER_SITE_OTHER): Payer: Medicare Other | Admitting: Psychology

## 2018-09-04 DIAGNOSIS — F331 Major depressive disorder, recurrent, moderate: Secondary | ICD-10-CM | POA: Diagnosis not present

## 2018-09-13 ENCOUNTER — Telehealth: Payer: Self-pay | Admitting: Internal Medicine

## 2018-09-13 NOTE — Telephone Encounter (Signed)
Pt called to request that [name] order her Patient Assistance Dilantin 100MG . Please advise.

## 2018-09-18 ENCOUNTER — Ambulatory Visit (INDEPENDENT_AMBULATORY_CARE_PROVIDER_SITE_OTHER): Payer: Medicare Other | Admitting: Psychology

## 2018-09-18 DIAGNOSIS — F331 Major depressive disorder, recurrent, moderate: Secondary | ICD-10-CM | POA: Diagnosis not present

## 2018-09-18 NOTE — Telephone Encounter (Signed)
RX ordered  confirmation #: [id number]

## 2018-09-21 NOTE — Telephone Encounter (Signed)
Pt is aware rx was ordered

## 2018-10-02 ENCOUNTER — Ambulatory Visit (INDEPENDENT_AMBULATORY_CARE_PROVIDER_SITE_OTHER): Payer: Medicare Other | Admitting: Psychology

## 2018-10-02 DIAGNOSIS — F331 Major depressive disorder, recurrent, moderate: Secondary | ICD-10-CM

## 2018-10-12 ENCOUNTER — Other Ambulatory Visit: Payer: Self-pay | Admitting: Internal Medicine

## 2018-10-13 NOTE — Telephone Encounter (Signed)
Pt calling to check the status of this request.  States that she is out and needs a refill as soon as possible.  Made pt aware that there could be a 72 hour turn around time.

## 2018-10-13 NOTE — Telephone Encounter (Signed)
[place] Controlled Database Checked Last filled: 09/20/18 # 300 ml LOV w/you: 08/29/18  Next appt w/you: 11/22/18

## 2018-10-16 ENCOUNTER — Ambulatory Visit (INDEPENDENT_AMBULATORY_CARE_PROVIDER_SITE_OTHER): Payer: Medicare Other | Admitting: Psychology

## 2018-10-16 DIAGNOSIS — F331 Major depressive disorder, recurrent, moderate: Secondary | ICD-10-CM | POA: Diagnosis not present

## 2018-10-24 ENCOUNTER — Other Ambulatory Visit: Payer: Self-pay | Admitting: Internal Medicine

## 2018-10-31 ENCOUNTER — Ambulatory Visit (INDEPENDENT_AMBULATORY_CARE_PROVIDER_SITE_OTHER): Payer: Medicare Other | Admitting: Psychology

## 2018-10-31 DIAGNOSIS — F331 Major depressive disorder, recurrent, moderate: Secondary | ICD-10-CM

## 2018-11-03 ENCOUNTER — Telehealth: Payer: Self-pay | Admitting: Internal Medicine

## 2018-11-03 NOTE — Telephone Encounter (Signed)
I called [facility] and ordered patient's Vraylar 1.5 mg. # 3 bottles will be shipped to this office within 7-10 days. Order # [id number].

## 2018-11-03 NOTE — Telephone Encounter (Signed)
Medication Refill - Medication: cariprazine (VRAYLAR) capsule  Pt states she gets this medication filled through a medication assistance program and that Dr Plotnikov's nurse has all of the appropriate information.  Please forward to his nurse, [name].     Agent: Please be advised that RX refills may take up to 3 business days. We ask that you follow-up with your pharmacy.

## 2018-11-10 ENCOUNTER — Telehealth: Payer: Self-pay | Admitting: *Deleted

## 2018-11-10 ENCOUNTER — Other Ambulatory Visit (INDEPENDENT_AMBULATORY_CARE_PROVIDER_SITE_OTHER): Payer: Medicare Other

## 2018-11-10 DIAGNOSIS — J439 Emphysema, unspecified: Secondary | ICD-10-CM | POA: Diagnosis not present

## 2018-11-10 DIAGNOSIS — R635 Abnormal weight gain: Secondary | ICD-10-CM

## 2018-11-10 DIAGNOSIS — E539 Vitamin B deficiency, unspecified: Secondary | ICD-10-CM

## 2018-11-10 DIAGNOSIS — E890 Postprocedural hypothyroidism: Secondary | ICD-10-CM

## 2018-11-10 DIAGNOSIS — E538 Deficiency of other specified B group vitamins: Secondary | ICD-10-CM | POA: Diagnosis not present

## 2018-11-10 DIAGNOSIS — I10 Essential (primary) hypertension: Secondary | ICD-10-CM

## 2018-11-10 DIAGNOSIS — E559 Vitamin D deficiency, unspecified: Secondary | ICD-10-CM | POA: Diagnosis not present

## 2018-11-10 DIAGNOSIS — I25111 Atherosclerotic heart disease of native coronary artery with angina pectoris with documented spasm: Secondary | ICD-10-CM

## 2018-11-10 LAB — HEPATIC FUNCTION PANEL
ALT: 8 U/L (ref 0–35)
AST: 12 U/L (ref 0–37)
Albumin: 4 g/dL (ref 3.5–5.2)
Alkaline Phosphatase: 122 U/L — ABNORMAL HIGH (ref 39–117)
Bilirubin, Direct: 0.1 mg/dL (ref 0.0–0.3)
Total Bilirubin: 0.3 mg/dL (ref 0.2–1.2)
Total Protein: 6.9 g/dL (ref 6.0–8.3)

## 2018-11-10 LAB — CBC WITH DIFFERENTIAL/PLATELET
Basophils Absolute: 0.1 10*3/uL (ref 0.0–0.1)
Basophils Relative: 0.6 % (ref 0.0–3.0)
Eosinophils Absolute: 0.2 10*3/uL (ref 0.0–0.7)
Eosinophils Relative: 2 % (ref 0.0–5.0)
HCT: 39.1 % (ref 36.0–46.0)
Hemoglobin: 13 g/dL (ref 12.0–15.0)
Lymphocytes Relative: 22.9 % (ref 12.0–46.0)
Lymphs Abs: 2 10*3/uL (ref 0.7–4.0)
MCHC: 33.3 g/dL (ref 30.0–36.0)
MCV: 89.9 fl (ref 78.0–100.0)
Monocytes Absolute: 1.2 10*3/uL — ABNORMAL HIGH (ref 0.1–1.0)
Monocytes Relative: 13.3 % — ABNORMAL HIGH (ref 3.0–12.0)
Neutro Abs: 5.3 10*3/uL (ref 1.4–7.7)
Neutrophils Relative %: 61.2 % (ref 43.0–77.0)
Platelets: 335 10*3/uL (ref 150.0–400.0)
RBC: 4.35 Mil/uL (ref 3.87–5.11)
RDW: 14.4 % (ref 11.5–15.5)
WBC: 8.7 10*3/uL (ref 4.0–10.5)

## 2018-11-10 LAB — URINALYSIS, ROUTINE W REFLEX MICROSCOPIC
Bilirubin Urine: NEGATIVE
Ketones, ur: NEGATIVE
Nitrite: NEGATIVE
Specific Gravity, Urine: <=1.005 — AB (ref 1.000–1.030)
Total Protein, Urine: NEGATIVE
Urine Glucose: NEGATIVE
Urobilinogen, UA: 0.2 (ref 0.0–1.0)
pH: 6 (ref 5.0–8.0)

## 2018-11-10 LAB — BASIC METABOLIC PANEL
BUN: 8 mg/dL (ref 6–23)
CO2: 27 mEq/L (ref 19–32)
Calcium: 8.2 mg/dL — ABNORMAL LOW (ref 8.4–10.5)
Chloride: 101 mEq/L (ref 96–112)
Creatinine, Ser: 0.72 mg/dL (ref 0.40–1.20)
GFR: 80.17 mL/min (ref >60.00)
Glucose, Bld: 162 mg/dL — ABNORMAL HIGH (ref 70–99)
Potassium: 4 mEq/L (ref 3.5–5.1)
Sodium: 138 mEq/L (ref 135–145)

## 2018-11-10 LAB — LIPID PANEL
Cholesterol: 249 mg/dL — ABNORMAL HIGH (ref 0–200)
HDL: 57.7 mg/dL (ref >39.00)
LDL Cholesterol: 174 mg/dL — ABNORMAL HIGH (ref 0–99)
NonHDL: 191.52
Total CHOL/HDL Ratio: 4
Triglycerides: 88 mg/dL (ref 0.0–149.0)
VLDL: 17.6 mg/dL (ref 0.0–40.0)

## 2018-11-10 LAB — VITAMIN B12: Vitamin B-12: 188 pg/mL — ABNORMAL LOW (ref 211–911)

## 2018-11-10 LAB — TSH: TSH: 2.22 u[IU]/mL (ref 0.35–4.50)

## 2018-11-10 LAB — VITAMIN D 25 HYDROXY (VIT D DEFICIENCY, FRACTURES): VITD: 27.78 ng/mL — ABNORMAL LOW (ref 30.00–100.00)

## 2018-11-10 NOTE — Telephone Encounter (Signed)
Reorder placed for Dilantin to be shipped on 11/29/18 per [place] patient assistance.  Patient ID is [id number].   This is the last fill before patient's enrollment ends on 03/15/19.  Order #: [id number]  Pt informed.

## 2018-11-13 ENCOUNTER — Ambulatory Visit (INDEPENDENT_AMBULATORY_CARE_PROVIDER_SITE_OTHER): Payer: Medicare Other | Admitting: Psychology

## 2018-11-13 DIAGNOSIS — F331 Major depressive disorder, recurrent, moderate: Secondary | ICD-10-CM

## 2018-11-14 ENCOUNTER — Encounter: Payer: Self-pay | Admitting: *Deleted

## 2018-11-17 ENCOUNTER — Other Ambulatory Visit: Payer: Self-pay | Admitting: Internal Medicine

## 2018-11-17 MED ORDER — VITAMIN D3 1.25 MG (50000 UT) PO CAPS: 1.0000 | ORAL_CAPSULE | ORAL | 0 refills | Status: DC

## 2018-11-17 MED ORDER — VITAMIN D3 50 MCG (2000 UT) PO CAPS: 2000.0000 [IU] | ORAL_CAPSULE | Freq: Every day | ORAL | 3 refills | Status: AC

## 2018-11-22 ENCOUNTER — Encounter: Payer: Self-pay | Admitting: Internal Medicine

## 2018-11-22 ENCOUNTER — Ambulatory Visit (INDEPENDENT_AMBULATORY_CARE_PROVIDER_SITE_OTHER): Payer: Medicare Other | Admitting: Internal Medicine

## 2018-11-22 ENCOUNTER — Other Ambulatory Visit: Payer: Self-pay

## 2018-11-22 VITALS — BP 130/68 | HR 79 | Temp 98.1°F | Ht 61.0 in | Wt 153.0 lb

## 2018-11-22 DIAGNOSIS — Z23 Encounter for immunization: Secondary | ICD-10-CM

## 2018-11-22 DIAGNOSIS — L509 Urticaria, unspecified: Secondary | ICD-10-CM

## 2018-11-22 DIAGNOSIS — R197 Diarrhea, unspecified: Secondary | ICD-10-CM

## 2018-11-22 DIAGNOSIS — E559 Vitamin D deficiency, unspecified: Secondary | ICD-10-CM

## 2018-11-22 DIAGNOSIS — E538 Deficiency of other specified B group vitamins: Secondary | ICD-10-CM

## 2018-11-22 DIAGNOSIS — I25111 Atherosclerotic heart disease of native coronary artery with angina pectoris with documented spasm: Secondary | ICD-10-CM

## 2018-11-22 DIAGNOSIS — F419 Anxiety disorder, unspecified: Secondary | ICD-10-CM

## 2018-11-22 DIAGNOSIS — R05 Cough: Secondary | ICD-10-CM

## 2018-11-22 DIAGNOSIS — R739 Hyperglycemia, unspecified: Secondary | ICD-10-CM | POA: Diagnosis not present

## 2018-11-22 DIAGNOSIS — R059 Cough, unspecified: Secondary | ICD-10-CM

## 2018-11-22 DIAGNOSIS — E875 Hyperkalemia: Secondary | ICD-10-CM

## 2018-11-22 MED ORDER — CYANOCOBALAMIN 1000 MCG/ML IJ SOLN
1000.0000 ug | Freq: Once | INTRAMUSCULAR | Status: AC
  Administered 2018-11-22: 1000 ug via SUBCUTANEOUS

## 2018-11-22 MED ORDER — PROMETHAZINE-CODEINE 6.25-10 MG/5ML PO SOLN: 5.0000 mL | Freq: Four times a day (QID) | ORAL | 1 refills | Status: DC | PRN

## 2018-11-22 MED ORDER — NITROGLYCERIN 0.4 MG SL SUBL: SUBLINGUAL_TABLET | SUBLINGUAL | 3 refills | Status: DC

## 2018-11-22 MED ORDER — ALBUTEROL SULFATE HFA 108 (90 BASE) MCG/ACT IN AERS: 1.0000 | INHALATION_SPRAY | RESPIRATORY_TRACT | 5 refills | Status: DC | PRN

## 2018-11-22 MED ORDER — DIPHENOXYLATE-ATROPINE 2.5-0.025 MG PO TABS: 1.0000 | ORAL_TABLET | Freq: Four times a day (QID) | ORAL | 1 refills | Status: DC | PRN

## 2018-11-22 MED ORDER — "BD ECLIPSE SYRINGE 25G X 1"" 3 ML MISC": 3 refills | Status: AC

## 2018-11-22 MED ORDER — CLONAZEPAM 1 MG PO TABS: ORAL_TABLET | ORAL | 3 refills | Status: DC

## 2018-11-22 MED ORDER — CYANOCOBALAMIN 1000 MCG/ML IJ SOLN: 1000.0000 ug | INTRAMUSCULAR | 5 refills | Status: DC

## 2018-11-22 MED ORDER — ATORVASTATIN CALCIUM 40 MG PO TABS: ORAL_TABLET | ORAL | 3 refills | Status: DC

## 2018-11-22 NOTE — Patient Instructions (Signed)
Lactaid

## 2018-11-22 NOTE — Assessment & Plan Note (Signed)
No CP 

## 2018-11-22 NOTE — Assessment & Plan Note (Signed)
On Vit D 

## 2018-11-22 NOTE — Addendum Note (Signed)
Addended by: Karren Cobble on: 11/22/2018 02:14 PM   Modules accepted: Orders

## 2018-11-22 NOTE — Assessment & Plan Note (Signed)
Chronic Prom-cod syr prn Potential benefits of a long term opioids use as well as potential risks (i.e. addiction risk, apnea etc) and complications (i.e. Somnolence, constipation and others) were explained to the patient and were aknowledged. 

## 2018-11-22 NOTE — Assessment & Plan Note (Signed)
Resume Vit B12 shots q 2 wks

## 2018-11-22 NOTE — Assessment & Plan Note (Signed)
Worse ?etiology GI ref

## 2018-11-22 NOTE — Assessment & Plan Note (Signed)
Clonazepam prn 

## 2018-11-22 NOTE — Progress Notes (Signed)
Subjective:  Patient ID: Veronica [name], female    DOB: 1951-02-02  Age: 68 y.o. MRN: [id number]  CC: No chief complaint on file.   HPI Veronica [name] presents for low B12,  Vit D def, COPD - chronic   Outpatient Medications Prior to Visit  Medication Sig Dispense Refill  . acetaminophen-codeine (TYLENOL #3) 300-30 MG tablet Take 1 tablet by mouth every 4 (four) hours as needed. 60 tablet 3  . albuterol (PROAIR HFA) 108 (90 Base) MCG/ACT inhaler Inhale 1-2 puffs into the lungs every 4 (four) hours as needed for wheezing or shortness of breath. 18 g 5  . albuterol (PROVENTIL) (2.5 MG/3ML) 0.083% nebulizer solution USE 1 VIAL IN NEBULIZER 2 TIMES DAILY. Generic: VENTOLIN 60 vial 5  . aspirin 81 MG chewable tablet Chew by mouth daily.    [name] atorvastatin (LIPITOR) 40 MG tablet TAKE 1 TABLET(40 MG) BY MOUTH DAILY 90 tablet 3  . budesonide (PULMICORT) 0.25 MG/2ML nebulizer solution USE 1 VIAL IN NEBULIZER 2 TIMES DAILY. Generic: PULMICORT 120 mL 2  . cariprazine (VRAYLAR) capsule Take 1 capsule (1.5 mg total) by mouth daily. 90 capsule 3  . cetirizine (ZYRTEC) 5 MG tablet Take 1 tablet (5 mg total) by mouth daily. 30 tablet 1  . Cholecalciferol (VITAMIN D3) 1.25 MG (50000 UT) CAPS Take 1 capsule by mouth once a week. 6 capsule 0  . Cholecalciferol (VITAMIN D3) 50 MCG (2000 UT) capsule Take 1 capsule (2,000 Units total) by mouth daily. 100 capsule 3  . Cholecalciferol 1000 units tablet Take 1,000 Units by mouth daily.    . clonazePAM (KLONOPIN) 1 MG tablet 2 po qhs and 1/2 tab in addition in daytime prn 225 tablet 3  . cyanocobalamin (,VITAMIN B-12,) 1000 MCG/ML injection Inject 1 mL (1,000 mcg total) into the muscle every 14 (fourteen) days. 10 mL 3  . cyclobenzaprine (FLEXERIL) 10 MG tablet Take 1 tablet (10 mg total) by mouth 3 (three) times daily as needed for muscle spasms. 90 tablet 0  . diphenoxylate-atropine (LOMOTIL) 2.5-0.025 MG tablet Take 1-2 tablets by mouth 4 (four) times daily as  needed for diarrhea or loose stools. 30 tablet 0  . Docusate Calcium (STOOL SOFTENER PO) Take 100 mg by mouth as needed (for constipation).     [name] escitalopram (LEXAPRO) 20 MG tablet Take 1 tablet (20 mg total) by mouth daily. 90 tablet 3  . formoterol (PERFOROMIST) 20 MCG/2ML nebulizer solution Take 2 mLs (20 mcg total) by nebulization 2 (two) times daily. 120 mL 3  . furosemide (LASIX) 20 MG tablet TAKE 1 TABLET(20 MG) BY MOUTH DAILY AS NEEDED 90 tablet 3  . Insulin Syringe-Needle U-100 (B-D INSULIN SYRINGE 1CC/26G) 26G X 1/2" 1 ML MISC Inject 1 each into the skin as directed. 100 each 5  . ketoconazole (NIZORAL) 2 % cream Apply 1 application topically 2 (two) times daily. 45 g 1  . levothyroxine (SYNTHROID, LEVOTHROID) 88 MCG tablet Take 1 tablet (88 mcg total) by mouth daily. 90 tablet 3  . magic mouthwash SOLN Take 5 mLs by mouth 4 (four) times daily. Swish, hold and spit out---Do  Not swallow.  100 ml of dexamethasone 0.5 mg per 5 ml elixir 60 ml nystatin 100,000 Unit 100 ml of diphenhydramine 12.5 mg per 5 ml elixir 260 mL 3  . nitroGLYCERIN (NITROSTAT) 0.4 MG SL tablet PLACE 1 TABLET UNDER THE TONGUE EVERY 5 MINUTES AS NEEDED FOR CHEST PAIN 25 tablet 3  . ondansetron (ZOFRAN) 4 MG  tablet Take 1 tablet (4 mg total) by mouth every 8 (eight) hours as needed for nausea or vomiting. 21 tablet 0  . phenytoin (DILANTIN) 100 MG ER capsule 200 mg every morning and 100 mg every evening, DILANTIN "brand name" only 270 capsule 3  . promethazine (PHENERGAN) 25 MG tablet Take 0.5 tablets (12.5 mg total) by mouth every 6 (six) hours as needed. 60 tablet 1  . Promethazine-Codeine 6.25-10 MG/5ML SOLN TAKE 5 ML BY MOUTH EVERY 6 HOURS AS NEEDED FOR COUGH 300 mL 0  . Respiratory Therapy Supplies (FLUTTER) DEVI Use after nebulization treatments 1 each 0  . sodium chloride (MURO 128) 5 % ophthalmic solution Place 1 drop into both eyes as needed for irritation.     . sodium chloride HYPERTONIC 3 % nebulizer  solution USE 1 VIAL VIA NEBULIZER TWICE DAILY 240 mL 2  . traZODone (DESYREL) 50 MG tablet TAKE 4 TABLETS(200 MG) BY MOUTH AT BEDTIME 360 tablet 1  . verapamil (CALAN-SR) 240 MG CR tablet TAKE 1 TABLET(240 MG) BY MOUTH AT BEDTIME 90 tablet 3  . hydrochlorothiazide (MICROZIDE) 12.5 MG capsule Take 1 capsule (12.5 mg total) by mouth 3 (three) times a week. Mondays, Wednesdays, and Fridays 45 capsule 3   No facility-administered medications prior to visit.     ROS: Review of Systems  Constitutional: Positive for fatigue. Negative for activity change, appetite change, chills and unexpected weight change.  HENT: Negative for congestion, mouth sores and sinus pressure.   Eyes: Negative for visual disturbance.  Respiratory: Positive for cough and shortness of breath. Negative for chest tightness.   Gastrointestinal: Positive for diarrhea. Negative for abdominal pain and nausea.  Genitourinary: Negative for difficulty urinating, frequency and vaginal pain.  Musculoskeletal: Negative for back pain and gait problem.  Skin: Negative for pallor and rash.  Neurological: Negative for dizziness, tremors, weakness, numbness and headaches.  Psychiatric/Behavioral: Positive for sleep disturbance. Negative for confusion and suicidal ideas. The patient is nervous/anxious.     Objective:  BP 130/68 (BP Location: Left Arm, Patient Position: Sitting, Cuff Size: Normal)   Pulse 79   Temp 98.1 F (36.7 C) (Oral)   Ht 5\' 1"  (1.549 m)   Wt 153 lb (69.4 kg)   SpO2 98%   BMI 28.91 kg/m   BP Readings from Last 3 Encounters:  11/22/18 130/68  08/29/18 138/76  06/29/18 (!) 144/72    Wt Readings from Last 3 Encounters:  11/22/18 153 lb (69.4 kg)  08/29/18 152 lb (68.9 kg)  06/29/18 149 lb (67.6 kg)    Physical Exam Constitutional:      General: She is not in acute distress.    Appearance: She is well-developed.  HENT:     Head: Normocephalic.     Right Ear: External ear normal.     Left Ear:  External ear normal.     Nose: Nose normal.  Eyes:     General:        Right eye: No discharge.        Left eye: No discharge.     Conjunctiva/sclera: Conjunctivae normal.     Pupils: Pupils are equal, round, and reactive to light.  Neck:     Musculoskeletal: Normal range of motion and neck supple.     Thyroid: No thyromegaly.     Vascular: No JVD.     Trachea: No tracheal deviation.  Cardiovascular:     Rate and Rhythm: Normal rate and regular rhythm.     Heart sounds:  Normal heart sounds.  Pulmonary:     Effort: No respiratory distress.     Breath sounds: No stridor. No wheezing.  Abdominal:     General: Bowel sounds are normal. There is no distension.     Palpations: Abdomen is soft. There is no mass.     Tenderness: There is no abdominal tenderness. There is no guarding or rebound.  Musculoskeletal:        General: No tenderness.  Lymphadenopathy:     Cervical: No cervical adenopathy.  Skin:    Findings: No erythema or rash.  Neurological:     Cranial Nerves: No cranial nerve deficit.     Motor: No abnormal muscle tone.     Coordination: Coordination normal.     Deep Tendon Reflexes: Reflexes normal.  Psychiatric:        Behavior: Behavior normal.        Thought Content: Thought content normal.        Judgment: Judgment normal.     Lab Results  Component Value Date   WBC 8.7 11/10/2018   HGB 13.0 11/10/2018   HCT 39.1 11/10/2018   PLT 335.0 11/10/2018   GLUCOSE 162 (H) 11/10/2018   CHOL 249 (H) 11/10/2018   TRIG 88.0 11/10/2018   HDL 57.70 11/10/2018   LDLDIRECT 165.0 09/26/2009   LDLCALC 174 (H) 11/10/2018   ALT 8 11/10/2018   AST 12 11/10/2018   NA 138 11/10/2018   K 4.0 11/10/2018   CL 101 11/10/2018   CREATININE 0.72 11/10/2018   BUN 8 11/10/2018   CO2 27 11/10/2018   TSH 2.22 11/10/2018   INR 1.0 ratio 11/07/2009    No results found.  Assessment & Plan:   There are no diagnoses linked to this encounter.   No orders of the defined types  were placed in this encounter.    Follow-up: No follow-ups on file.  Walker Kehr, MD

## 2018-11-23 ENCOUNTER — Ambulatory Visit (INDEPENDENT_AMBULATORY_CARE_PROVIDER_SITE_OTHER): Payer: Medicare Other | Admitting: Cardiovascular Disease

## 2018-11-23 VITALS — BP 146/76 | HR 71 | Ht 60.0 in | Wt 153.0 lb

## 2018-11-23 DIAGNOSIS — I25118 Atherosclerotic heart disease of native coronary artery with other forms of angina pectoris: Secondary | ICD-10-CM

## 2018-11-23 DIAGNOSIS — E7800 Pure hypercholesterolemia, unspecified: Secondary | ICD-10-CM

## 2018-11-23 DIAGNOSIS — E119 Type 2 diabetes mellitus without complications: Secondary | ICD-10-CM | POA: Diagnosis not present

## 2018-11-23 DIAGNOSIS — I25111 Atherosclerotic heart disease of native coronary artery with angina pectoris with documented spasm: Secondary | ICD-10-CM

## 2018-11-23 DIAGNOSIS — I10 Essential (primary) hypertension: Secondary | ICD-10-CM

## 2018-11-23 DIAGNOSIS — I700 Atherosclerosis of aorta: Secondary | ICD-10-CM

## 2018-11-23 NOTE — Patient Instructions (Signed)
Medication Instructions:  Your physician recommends that you continue on your current medications as directed. Please refer to the Current Medication list given to you today.  If you need a refill on your cardiac medications before your next appointment, please call your pharmacy.   Lab work: Please have the following fasting labs done in three months: Lipid and A1C  If you have labs (blood work) drawn today and your tests are completely normal, you will receive your results only by: [place] (if you have MyChart) OR A paper copy in the mail If you have any lab test that is abnormal or we need to change your treatment, we will call you to review the results.  Testing/Procedures: None ordered  Follow-Up: At [facility], you and your health needs are our priority.  As part of our continuing mission to provide you with exceptional heart care, we have created designated Provider Care Teams.  These Care Teams include your primary Cardiologist (physician) and Advanced Practice Providers (APPs -  Physician Assistants and Nurse Practitioners) who all work together to provide you with the care you need, when you need it. You will need a follow up appointment in 12 months.  Please call our office 2 months in advance to schedule this appointment.  You may see Sanda Klein, MD or one of the following Advanced Practice Providers on your designated Care Team: Almyra Deforest, [place] Fabian Sharp, [place]      Diabetes Mellitus and Nutrition, Adult When you have diabetes (diabetes mellitus), it is very important to have healthy eating habits because your blood sugar (glucose) levels are greatly affected by what you eat and drink. Eating healthy foods in the appropriate amounts, at about the same times every day, can help you:  Control your blood glucose.  Lower your risk of heart disease.  Improve your blood pressure.  Reach or maintain a healthy weight. Every person with diabetes is different,  and each person has different needs for a meal plan. Your health care provider may recommend that you work with a diet and nutrition specialist (dietitian) to make a meal plan that is best for you. Your meal plan may vary depending on factors such as:  The calories you need.  The medicines you take.  Your weight.  Your blood glucose, blood pressure, and cholesterol levels.  Your activity level.  Other health conditions you have, such as heart or kidney disease. How do carbohydrates affect me? Carbohydrates, also called carbs, affect your blood glucose level more than any other type of food. Eating carbs naturally raises the amount of glucose in your blood. Carb counting is a method for keeping track of how many carbs you eat. Counting carbs is important to keep your blood glucose at a healthy level, especially if you use insulin or take certain oral diabetes medicines. It is important to know how many carbs you can safely have in each meal. This is different for every person. Your dietitian can help you calculate how many carbs you should have at each meal and for each snack. Foods that contain carbs include:  Bread, cereal, rice, pasta, and crackers.  Potatoes and corn.  Peas, beans, and lentils.  Milk and yogurt.  Fruit and juice.  Desserts, such as cakes, cookies, ice cream, and candy. How does alcohol affect me? Alcohol can cause a sudden decrease in blood glucose (hypoglycemia), especially if you use insulin or take certain oral diabetes medicines. Hypoglycemia can be a life-threatening condition. Symptoms of hypoglycemia (  sleepiness, dizziness, and confusion) are similar to symptoms of having too much alcohol. If your health care provider says that alcohol is safe for you, follow these guidelines:  Limit alcohol intake to no more than 1 drink per day for nonpregnant women and 2 drinks per day for men. One drink equals 12 oz of beer, 5 oz of wine, or 1 oz of hard liquor.  Do  not drink on an empty stomach.  Keep yourself hydrated with water, diet soda, or unsweetened iced tea.  Keep in mind that regular soda, juice, and other mixers may contain a lot of sugar and must be counted as carbs. What are tips for following this plan?  Reading food labels  Start by checking the serving size on the "Nutrition Facts" label of packaged foods and drinks. The amount of calories, carbs, fats, and other nutrients listed on the label is based on one serving of the item. Many items contain more than one serving per package.  Check the total grams (g) of carbs in one serving. You can calculate the number of servings of carbs in one serving by dividing the total carbs by 15. For example, if a food has 30 g of total carbs, it would be equal to 2 servings of carbs.  Check the number of grams (g) of saturated and trans fats in one serving. Choose foods that have low or no amount of these fats.  Check the number of milligrams (mg) of salt (sodium) in one serving. Most people should limit total sodium intake to less than 2,300 mg per day.  Always check the nutrition information of foods labeled as "low-fat" or "nonfat". These foods may be higher in added sugar or refined carbs and should be avoided.  Talk to your dietitian to identify your daily goals for nutrients listed on the label. Shopping  Avoid buying canned, premade, or processed foods. These foods tend to be high in fat, sodium, and added sugar.  Shop around the outside edge of the grocery store. This includes fresh fruits and vegetables, bulk grains, fresh meats, and fresh dairy. Cooking  Use low-heat cooking methods, such as baking, instead of high-heat cooking methods like deep frying.  Cook using healthy oils, such as olive, canola, or sunflower oil.  Avoid cooking with butter, cream, or high-fat meats. Meal planning  Eat meals and snacks regularly, preferably at the same times every day. Avoid going long periods  of time without eating.  Eat foods high in fiber, such as fresh fruits, vegetables, beans, and whole grains. Talk to your dietitian about how many servings of carbs you can eat at each meal.  Eat 4-6 ounces (oz) of lean protein each day, such as lean meat, chicken, fish, eggs, or tofu. One oz of lean protein is equal to: ? 1 oz of meat, chicken, or fish. ? 1 egg. ?  cup of tofu.  Eat some foods each day that contain healthy fats, such as avocado, nuts, seeds, and fish. Lifestyle  Check your blood glucose regularly.  Exercise regularly as told by your health care provider. This may include: ? 150 minutes of moderate-intensity or vigorous-intensity exercise each week. This could be brisk walking, biking, or water aerobics. ? Stretching and doing strength exercises, such as yoga or weightlifting, at least 2 times a week.  Take medicines as told by your health care provider.  Do not use any products that contain nicotine or tobacco, such as cigarettes and e-cigarettes. If you need help quitting,  ask your health care provider.  Work with a Social worker or diabetes educator to identify strategies to manage stress and any emotional and social challenges. Questions to ask a health care provider  Do I need to meet with a diabetes educator?  Do I need to meet with a dietitian?  What number can I call if I have questions?  When are the best times to check my blood glucose? Where to find more information:  American Diabetes Association: diabetes.org  Academy of Nutrition and Dietetics: www.eatright.[employer] of Diabetes and Digestive and Kidney Diseases (NIH): [employer] Summary  A healthy meal plan will help you control your blood glucose and maintain a healthy lifestyle.  Working with a diet and nutrition specialist (dietitian) can help you make a meal plan that is best for you.  Keep in mind that carbohydrates (carbs) and alcohol have immediate effects on your  blood glucose levels. It is important to count carbs and to use alcohol carefully. This information is not intended to replace advice given to you by your health care provider. Make sure you discuss any questions you have with your health care provider. Document Released: 11/26/2004 Document Revised: 02/11/2017 Document Reviewed: 04/05/2016 Elsevier Patient Education  2020 [employer].
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
## 2018-11-25 ENCOUNTER — Encounter: Payer: Self-pay | Admitting: Cardiovascular Disease

## 2018-11-27 ENCOUNTER — Ambulatory Visit (INDEPENDENT_AMBULATORY_CARE_PROVIDER_SITE_OTHER): Payer: Medicare Other | Admitting: Psychology

## 2018-11-27 DIAGNOSIS — F331 Major depressive disorder, recurrent, moderate: Secondary | ICD-10-CM | POA: Diagnosis not present

## 2018-12-05 ENCOUNTER — Telehealth: Payer: Self-pay | Admitting: Internal Medicine

## 2018-12-05 NOTE — Telephone Encounter (Signed)
ok 

## 2018-12-06 ENCOUNTER — Encounter: Payer: Self-pay | Admitting: Internal Medicine

## 2018-12-06 NOTE — Telephone Encounter (Signed)
Patient scheduled on 02/01/2019

## 2018-12-11 ENCOUNTER — Ambulatory Visit (INDEPENDENT_AMBULATORY_CARE_PROVIDER_SITE_OTHER): Payer: Medicare Other | Admitting: Psychology

## 2018-12-11 DIAGNOSIS — F331 Major depressive disorder, recurrent, moderate: Secondary | ICD-10-CM

## 2018-12-12 ENCOUNTER — Telehealth: Payer: Self-pay

## 2018-12-12 NOTE — Telephone Encounter (Signed)
Copied from [place] [id number]. Topic: General - Other >> Dec 12, 2018 11:29 AM Leward Quan A wrote: Reason for CRM: Patient called to ask Veronica [name] about her medication assistance medication phenytoin (DILANTIN) 100 MG ER capsule asking for a call back at Ph# [phone] say that she is running low and she is getting a little worried. Please advise

## 2018-12-13 NOTE — Telephone Encounter (Signed)
Pt notified we have received prescription

## 2018-12-13 NOTE — Telephone Encounter (Signed)
Patient is calling because she still has not heard back from [facility] regarding her Dilantin.  Patient is running very low and needs a response as soon as possible.

## 2018-12-25 ENCOUNTER — Ambulatory Visit (INDEPENDENT_AMBULATORY_CARE_PROVIDER_SITE_OTHER): Payer: Medicare Other | Admitting: Psychology

## 2018-12-25 DIAGNOSIS — F331 Major depressive disorder, recurrent, moderate: Secondary | ICD-10-CM | POA: Diagnosis not present

## 2019-01-08 ENCOUNTER — Ambulatory Visit (INDEPENDENT_AMBULATORY_CARE_PROVIDER_SITE_OTHER): Payer: Medicare Other | Admitting: Psychology

## 2019-01-08 DIAGNOSIS — F331 Major depressive disorder, recurrent, moderate: Secondary | ICD-10-CM | POA: Diagnosis not present

## 2019-01-17 ENCOUNTER — Other Ambulatory Visit: Payer: Self-pay | Admitting: Internal Medicine

## 2019-01-19 ENCOUNTER — Other Ambulatory Visit: Payer: Self-pay | Admitting: Internal Medicine

## 2019-01-19 ENCOUNTER — Other Ambulatory Visit: Payer: Self-pay

## 2019-01-19 MED ORDER — CARIPRAZINE HCL 1.5 MG PO CAPS: 1.5000 mg | ORAL_CAPSULE | Freq: Every day | ORAL | 3 refills | Status: DC

## 2019-01-22 ENCOUNTER — Ambulatory Visit (INDEPENDENT_AMBULATORY_CARE_PROVIDER_SITE_OTHER): Payer: Medicare Other | Admitting: Psychology

## 2019-01-22 DIAGNOSIS — F331 Major depressive disorder, recurrent, moderate: Secondary | ICD-10-CM | POA: Diagnosis not present

## 2019-01-22 DIAGNOSIS — R4581 Low self-esteem: Secondary | ICD-10-CM | POA: Diagnosis not present

## 2019-01-29 ENCOUNTER — Encounter: Payer: Self-pay | Admitting: *Deleted

## 2019-02-01 ENCOUNTER — Ambulatory Visit (INDEPENDENT_AMBULATORY_CARE_PROVIDER_SITE_OTHER): Payer: Medicare Other | Admitting: Internal Medicine

## 2019-02-01 ENCOUNTER — Encounter: Payer: Self-pay | Admitting: Internal Medicine

## 2019-02-01 ENCOUNTER — Other Ambulatory Visit: Payer: Self-pay

## 2019-02-01 ENCOUNTER — Other Ambulatory Visit (INDEPENDENT_AMBULATORY_CARE_PROVIDER_SITE_OTHER): Payer: Medicare Other

## 2019-02-01 VITALS — BP 132/80 | HR 66 | Temp 98.3°F | Ht 60.0 in | Wt 147.4 lb

## 2019-02-01 DIAGNOSIS — R1011 Right upper quadrant pain: Secondary | ICD-10-CM

## 2019-02-01 DIAGNOSIS — Z800 Family history of malignant neoplasm of digestive organs: Secondary | ICD-10-CM | POA: Diagnosis not present

## 2019-02-01 DIAGNOSIS — E538 Deficiency of other specified B group vitamins: Secondary | ICD-10-CM

## 2019-02-01 DIAGNOSIS — R195 Other fecal abnormalities: Secondary | ICD-10-CM

## 2019-02-01 DIAGNOSIS — I25111 Atherosclerotic heart disease of native coronary artery with angina pectoris with documented spasm: Secondary | ICD-10-CM | POA: Diagnosis not present

## 2019-02-01 DIAGNOSIS — Z86010 Personal history of colonic polyps: Secondary | ICD-10-CM

## 2019-02-01 LAB — BUN: BUN: 6 mg/dL (ref 6–23)

## 2019-02-01 LAB — CREATININE, SERUM: Creatinine, Ser: 0.7 mg/dL (ref 0.40–1.20)

## 2019-02-01 NOTE — Patient Instructions (Addendum)
You have been scheduled for an MRI at [facility] on 02/12/19. Your appointment time is 8:30 am. Please arrive 15 minutes prior to your appointment time for registration purposes. Please make certain not to have anything to eat or drink 6 hours prior to your test. In addition, if you have any metal in your body, have a pacemaker or defibrillator, please be sure to let your ordering physician know. This test typically takes 45 minutes to 1 hour to complete. Should you need to reschedule, please call [phone] to do so.  Your provider has requested that you go to the basement level for lab work before leaving today. Press "B" on the elevator. The lab is located at the first door on the left as you exit the elevator.  You will be due for endoscopy/colonoscopy in January 2021.  Continue B12 injections for your b12 deficiency.  If you are age 56 or older, your body mass index should be between 23-30. Your Body mass index is 28.78 kg/m. If this is out of the aforementioned range listed, please consider follow up with your Primary Care Provider.  If you are age 55 or younger, your body mass index should be between 19-25. Your Body mass index is 28.78 kg/m. If this is out of the aformentioned range listed, please consider follow up with your Primary Care Provider.

## 2019-02-01 NOTE — Progress Notes (Signed)
Patient ID: Veronica [name], female   DOB: 12-06-1950, 68 y.o.   MRN: [id number] HPI: Veronica [name] is a 68 year old female with a past medical history of colon polyps, diverticulosis, history of gastritis, history of GERD status post Nissen fundoplication, [id number] deficiency who is seen to evaluate right upper quadrant pain, loose stools and to consider surveillance colonoscopy.  She is here alone today.  She was previously managed by Dr. Delfin Edis and last had a colonoscopy on 01/18/2014.  This revealed a 6 mm sigmoid colon polyp which was found to be a sessile serrated polyp and mild sigmoid diverticulosis.  There were small internal hemorrhoids.  Her last upper endoscopy was performed by Dr. Fuller Plan in the setting of hospitalization with nausea, vomiting epigastric pain and early satiety.  He found moderate antral gastritis and evidence of prior fundoplication.  This showed active gastritis with reactive epithelial changes.  She reports that she has been having issues with right upper quadrant pain which radiates straight through to her right mid back.  This is intermittent.  Not necessarily associated with eating.  Not related to bowel movement.  Can last 30 minutes to an hour.  Has been present for less than a year but longer than a few months.  Definitely comes and goes.  Denies heartburn and issues with swallowing.  She has had prior Nissen as discussed above.  She does have issues intermittently with loose stools where she can have 3-4 bowel movements a day, none at night.  Stools are loose and then watery.  Can be urgent and has had several accidents.  Seems to be worse when she is due for B12 injection.  No blood in her stool or melena.  No weight loss.  She has been using B12 1000 mcg injection every 14 days along with 5000 mcg orally daily.  Her last B12 was checked in August and it was 188 but this was prior to the oral supplementation.  She has had prior cholecystectomy.  Her sister had colon  cancer  Past Medical History:  Diagnosis Date  . Adenomatous colon polyp   . Asthma   . AVM (arteriovenous malformation)   . Bronchitis, chronic ([place])   . CAD (coronary artery disease)   . Colon polyp 01/04/91   hyperplastic  . COPD (chronic obstructive pulmonary disease) ([place])   . CVA (cerebral infarction)    following brain surgery  . Depression   . Diverticulosis of colon 02/17/06  . Endometriosis   . Gastric ulcer   . GERD (gastroesophageal reflux disease)   . History of colonic polyps   . Hyperlipidemia   . Hypertension   . LBP (low back pain)   . Migraine headache   . OA (osteoarthritis)   . PONV (postoperative nausea and vomiting)    slight nausea  . Seizure disorder ([place])   . Seizures ([place])   . Shortness of breath dyspnea   . Sjogren's disease ([place]) 2010   per Dr. Owens Shark, [place]  . Stroke [facility])    right side weakness  . Thyroid nodule   . Vitamin B 12 deficiency   . Vitamin D deficiency     Past Surgical History:  Procedure Laterality Date  . BRAIN SURGERY  1991  . CATARACT EXTRACTION W/ INTRAOCULAR LENS  IMPLANT, BILATERAL    . CHOLECYSTECTOMY    . COLONOSCOPY    . ESOPHAGOGASTRODUODENOSCOPY     x4  . HEMORRHOIDECTOMY WITH HEMORRHOID BANDING    . LAPAROSCOPIC NISSEN FUNDOPLICATION    .  LAPAROSCOPIC OVARIAN CYSTECTOMY    . MOUTH SURGERY    . NISSEN FUNDOPLICATION  [id number]  . TEMPOROMANDIBULAR JOINT SURGERY  02/2001  . THYROIDECTOMY N/A 08/29/2015   Procedure:  TOTAL THYROIDECTOMY;  Surgeon: Armandina Gemma, MD;  Location: [facility];  Service: General;  Laterality: N/A;  . TOTAL THYROIDECTOMY  08/29/2015    Outpatient Medications Prior to Visit  Medication Sig Dispense Refill  . acetaminophen-codeine (TYLENOL #3) 300-30 MG tablet Take 1 tablet by mouth every 4 (four) hours as needed. 60 tablet 3  . albuterol (PROAIR HFA) 108 (90 Base) MCG/ACT inhaler Inhale 1-2 puffs into the lungs every 4 (four) hours as needed for wheezing or shortness of breath. 18 g 5  .  albuterol (PROVENTIL) (2.5 MG/3ML) 0.083% nebulizer solution USE 1 VIAL IN NEBULIZER 2 TIMES DAILY. Generic: VENTOLIN 60 vial 5  . aspirin 81 MG chewable tablet Chew by mouth daily.    [name] atorvastatin (LIPITOR) 40 MG tablet TAKE 1 TABLET(40 MG) BY MOUTH DAILY 90 tablet 3  . budesonide (PULMICORT) 0.25 MG/2ML nebulizer solution USE 1 VIAL IN NEBULIZER 2 TIMES DAILY. Generic: PULMICORT 120 mL 2  . cariprazine (VRAYLAR) capsule Take 1 capsule (1.5 mg total) by mouth daily. 90 capsule 3  . cetirizine (ZYRTEC) 5 MG tablet Take 1 tablet (5 mg total) by mouth daily. 30 tablet 1  . Cholecalciferol (VITAMIN D3) 50 MCG (2000 UT) capsule Take 1 capsule (2,000 Units total) by mouth daily. 100 capsule 3  . Cholecalciferol 1000 units tablet Take 1,000 Units by mouth daily.    . clonazePAM (KLONOPIN) 1 MG tablet 2 po qhs and 1/2 tab in addition in daytime prn 225 tablet 3  . cyanocobalamin (,VITAMIN B-12,) 1000 MCG/ML injection Inject 1 mL (1,000 mcg total) into the muscle every 14 (fourteen) days. 10 mL 5  . Cyanocobalamin (VITAMIN B-12 PO) Take 5,000 mcg by mouth daily.    . cyclobenzaprine (FLEXERIL) 10 MG tablet Take 1 tablet (10 mg total) by mouth 3 (three) times daily as needed for muscle spasms. 90 tablet 0  . diphenoxylate-atropine (LOMOTIL) 2.5-0.025 MG tablet Take 1-2 tablets by mouth 4 (four) times daily as needed for diarrhea or loose stools. 30 tablet 1  . Docusate Calcium (STOOL SOFTENER PO) Take 100 mg by mouth as needed (for constipation).     [name] escitalopram (LEXAPRO) 20 MG tablet TAKE 1 TABLET BY MOUTH DAILY 90 tablet 3  . formoterol (PERFOROMIST) 20 MCG/2ML nebulizer solution Take 2 mLs (20 mcg total) by nebulization 2 (two) times daily. 120 mL 3  . furosemide (LASIX) 20 MG tablet TAKE 1 TABLET(20 MG) BY MOUTH DAILY AS NEEDED 90 tablet 3  . ketoconazole (NIZORAL) 2 % cream Apply 1 application topically 2 (two) times daily. 45 g 1  . levothyroxine (SYNTHROID) 88 MCG tablet TAKE 1 TABLET(88 MCG) BY  MOUTH DAILY 90 tablet 3  . magic mouthwash SOLN Take 5 mLs by mouth 4 (four) times daily. Swish, hold and spit out---Do  Not swallow.  100 ml of dexamethasone 0.5 mg per 5 ml elixir 60 ml nystatin 100,000 Unit 100 ml of diphenhydramine 12.5 mg per 5 ml elixir 260 mL 3  . nitroGLYCERIN (NITROSTAT) 0.4 MG SL tablet PLACE 1 TABLET UNDER THE TONGUE EVERY 5 MINUTES AS NEEDED FOR CHEST PAIN 25 tablet 3  . ondansetron (ZOFRAN) 4 MG tablet Take 1 tablet (4 mg total) by mouth every 8 (eight) hours as needed for nausea or vomiting. 21 tablet 0  . phenytoin (  DILANTIN) 100 MG ER capsule 200 mg every morning and 100 mg every evening, DILANTIN "brand name" only 270 capsule 3  . promethazine (PHENERGAN) 25 MG tablet Take 0.5 tablets (12.5 mg total) by mouth every 6 (six) hours as needed. 60 tablet 1  . Promethazine-Codeine 6.25-10 MG/5ML SOLN TAKE 5 ML BY MOUTH EVERY 6 HOURS AS NEEDED FOR COUGH 300 mL 0  . Respiratory Therapy Supplies (FLUTTER) DEVI Use after nebulization treatments 1 each 0  . sodium chloride (MURO 128) 5 % ophthalmic solution Place 1 drop into both eyes as needed for irritation.     . sodium chloride HYPERTONIC 3 % nebulizer solution USE 1 VIAL VIA NEBULIZER TWICE DAILY 240 mL 2  . SYRINGE-NEEDLE, DISP, 3 ML (BD ECLIPSE SYRINGE) 25G X 1" 3 ML MISC Use sq q 2 wks 50 each 3  . traZODone (DESYREL) 50 MG tablet TAKE 4 TABLETS(200 MG) BY MOUTH AT BEDTIME 360 tablet 1  . verapamil (CALAN-SR) 240 MG CR tablet TAKE 1 TABLET(240 MG) BY MOUTH AT BEDTIME 90 tablet 3  . hydrochlorothiazide (MICROZIDE) 12.5 MG capsule Take 1 capsule (12.5 mg total) by mouth 3 (three) times a week. Mondays, Wednesdays, and Fridays 45 capsule 3  . Cholecalciferol (VITAMIN D3) 1.25 MG (50000 UT) CAPS Take 1 capsule by mouth once a week. 6 capsule 0   No facility-administered medications prior to visit.     Allergies  Allergen Reactions  . Avelox [Moxifloxacin Hcl In Nacl] Anaphylaxis, Swelling and Rash  . Cephalexin  Shortness Of Breath and Itching  . Moxifloxacin Anaphylaxis, Itching, Swelling and Rash    Avelox  . Amantadine Hcl Other (See Comments)    Unknown  . Bee Venom Itching and Swelling    Localized  . Clindamycin Hcl Other (See Comments)    severe allergic reaction  . Cymbalta [Duloxetine Hcl] Nausea Only    Dizzy  . Cyproheptadine Hcl Other (See Comments)    Unknown  . Doxycycline Hyclate Other (See Comments)    High blood pressure  . Effexor [Venlafaxine Hydrochloride] Other (See Comments)    elev BP (sky high), dizzy, shaking, ER visit  . Effexor [Venlafaxine]     Restless   . Fluticasone-Salmeterol Itching  . Guaifenesin Other (See Comments)    Unknown  . Imipramine Hcl Other (See Comments)    Unknown   . Lactose Intolerance (Gi) Other (See Comments)    Per allergy testing  . Latex Itching    dermatitis  . Other Other (See Comments)    SULFONAMIDES = [Rash]  . Oxycodone-Acetaminophen Itching  . Phenytoin Other (See Comments)    Pt must DILANTIN "brand name" only   . Pirbuterol Acetate Other (See Comments)    Maxair, Unknown  . Remeron [Mirtazapine]     nightmares  . Salmeterol Xinafoate Other (See Comments)    Shaking, Serevent  . Viibryd [Vilazodone Hcl] Itching and Nausea Only    shaky  . Zolpidem Tartrate Other (See Comments)    REACTION: not effective  . Azithromycin Itching and Rash  . Ciprofloxacin Itching and Rash  . Contrast Media  [Iodinated Diagnostic Agents] Rash    Other reaction(s): Respiratory Distress (ALLERGY/intolerance)  . Erythromycin Base Itching and Rash  . Flagyl [Metronidazole Hcl] Itching and Rash  . Fluconazole Itching and Rash  . Fluoxetine Rash  . Lansoprazole Itching and Rash  . Metoclopramide Hcl Itching and Rash  . Montelukast Sodium Itching and Rash  . Penicillins Itching and Rash    All  cillins, Has patient had a PCN reaction causing immediate rash, facial/tongue/throat swelling, SOB or lightheadedness with hypotension:  Yes Has patient had a PCN reaction causing severe rash involving mucus membranes or skin necrosis: No Has patient had a PCN reaction that required hospitalization No Has patient had a PCN reaction occurring within the last 10 years: Yes If all of the above answers are "NO", then may proceed with Cephalosporin use.   [name] Propulsid [Cisapride] Itching and Rash  . Reglan [Metoclopramide] Itching and Rash  . Sulfadiazine Itching and Rash  . Sulfamethoxazole-Trimethoprim Itching and Rash  . Telithromycin Itching and Rash  . Tetracycline Hcl Itching and Rash  . Topiramate Itching and Rash  . Valproic Acid Rash    Family History  Problem Relation Age of Onset  . Hypertension Mother   . Heart attack Mother   . Arthritis Mother   . Heart disease Father   . Pancreatic cancer Father   . Colon cancer Sister 55  . Bone cancer Sister   . Liver cancer Sister   . Hypertension Other   . Diabetes Other   . Diabetes Brother   . Asthma Brother   . Emphysema Maternal Aunt        x2  . Rheumatologic disease Maternal Grandfather   . Esophageal cancer Neg Hx     Social History   Tobacco Use  . Smoking status: Never Smoker  . Smokeless tobacco: Never Used  . Tobacco comment: Father & mutiple other family members smoked.  Substance Use Topics  . Alcohol use: No  . Drug use: No    ROS: As per history of present illness, otherwise negative  BP 132/80   Pulse 66   Temp 98.3 F (36.8 C)   Ht 5' (1.524 m)   Wt 147 lb 6 oz (66.8 kg)   BMI 28.78 kg/m  Constitutional: Well-developed and well-nourished. No distress. HEENT: Normocephalic and atraumatic. Oropharynx is clear and moist. Conjunctivae are normal.  No scleral icterus. Neck: Neck supple. Trachea midline. Cardiovascular: Normal rate, regular rhythm and intact distal pulses. No M/R/G Pulmonary/chest: Effort normal and breath sounds normal. No wheezing, rales or rhonchi. Abdominal: Soft, nontender, nondistended. Bowel sounds active  throughout. There are no masses palpable. No hepatosplenomegaly. Extremities: no clubbing, cyanosis, or edema Neurological: Alert and oriented to person place and time. Skin: Skin is warm and dry.  Psychiatric: Normal mood and affect. Behavior is normal.  RELEVANT LABS AND IMAGING: CBC    Component Value Date/Time   WBC 8.7 11/10/2018 0837   RBC 4.35 11/10/2018 0837   HGB 13.0 11/10/2018 0837   HCT 39.1 11/10/2018 0837   PLT 335.0 11/10/2018 0837   MCV 89.9 11/10/2018 0837   MCH 29.1 08/21/2015 1450   MCHC 33.3 11/10/2018 0837   RDW 14.4 11/10/2018 0837   LYMPHSABS 2.0 11/10/2018 0837   MONOABS 1.2 (H) 11/10/2018 0837   EOSABS 0.2 11/10/2018 0837   BASOSABS 0.1 11/10/2018 0837    CMP     Component Value Date/Time   NA 138 11/10/2018 0837   K 4.0 11/10/2018 0837   CL 101 11/10/2018 0837   CO2 27 11/10/2018 0837   GLUCOSE 162 (H) 11/10/2018 0837   BUN 6 02/01/2019 1445   CREATININE 0.70 02/01/2019 1445   CALCIUM 8.2 (L) 11/10/2018 0837   PROT 6.9 11/10/2018 0837   ALBUMIN 4.0 11/10/2018 0837   AST 12 11/10/2018 0837   ALT 8 11/10/2018 0837   ALKPHOS 122 (H) 11/10/2018 [id number]  BILITOT 0.3 11/10/2018 0837   GFRNONAA >60 08/31/2015 0254   GFRAA >60 08/31/2015 0254    ASSESSMENT/PLAN: 68 year old female with a past medical history of colon polyps, diverticulosis, history of gastritis, history of GERD status post Nissen fundoplication, 123456 deficiency who is seen to evaluate right upper quadrant pain, loose stools and to consider surveillance colonoscopy.  1.  Right upper quadrant abdominal pain -radiating to the back.  Would consider biliary pain though she has had prior cholecystectomy.  She also has a history of gastritis.  I recommended the following --MRI abdomen with contrast plus MRCP; rule out hepatobiliary pathology retained CBD stone --If negative proceed to upper endoscopy for further evaluation, rule out gastritis, H. Pylori  2.  Personal history of sessile  serrated polyps and family history of colon cancer --colonoscopy recommended for surveillance.  She prefers this to be in January 2021.  We discussed the risk, benefits and alternatives and she is agreeable and wishes to proceed  3.  Loose stools --intermittent, does not sound infectious.  Colonoscopy planned  4.  B12 deficiency --B12 was borderline low at 188 despite injection every 14 days.  She plans to have this repeated recently when she has labs with primary care.  She is now on oral supplementation however if still borderline low on recheck would increase injection to weekly      [id number], Evie Lacks, Md [address] [place],  [place] [address]

## 2019-02-05 ENCOUNTER — Ambulatory Visit (INDEPENDENT_AMBULATORY_CARE_PROVIDER_SITE_OTHER): Payer: Medicare Other | Admitting: Psychology

## 2019-02-05 DIAGNOSIS — F331 Major depressive disorder, recurrent, moderate: Secondary | ICD-10-CM | POA: Diagnosis not present

## 2019-02-07 ENCOUNTER — Other Ambulatory Visit: Payer: Self-pay

## 2019-02-12 ENCOUNTER — Other Ambulatory Visit: Payer: Self-pay

## 2019-02-12 ENCOUNTER — Ambulatory Visit (HOSPITAL_COMMUNITY)
Admission: RE | Admit: 2019-02-12 | Discharge: 2019-02-12 | Disposition: A | Discharge status: Home / Self Care | Payer: Medicare Other | Source: Ambulatory Visit | Attending: Internal Medicine | Admitting: Internal Medicine

## 2019-02-12 ENCOUNTER — Other Ambulatory Visit: Payer: Self-pay | Admitting: Internal Medicine

## 2019-02-12 DIAGNOSIS — R1011 Right upper quadrant pain: Secondary | ICD-10-CM | POA: Insufficient documentation

## 2019-02-12 MED ORDER — GADOBUTROL 1 MMOL/ML IV SOLN
6.0000 mL | Freq: Once | INTRAVENOUS | Status: AC | PRN
  Administered 2019-02-12: 6 mL via INTRAVENOUS

## 2019-02-13 ENCOUNTER — Other Ambulatory Visit: Payer: Self-pay | Admitting: Internal Medicine

## 2019-02-13 ENCOUNTER — Telehealth: Payer: Self-pay

## 2019-02-13 NOTE — Telephone Encounter (Signed)
Copied from [place] [phone]. Topic: General - Other >> Feb 13, 2019  9:29 AM Rainey Pines A wrote: Patient called to have the Dilantin medication sent to [facility] patient assistance and would like a callback from [facility]

## 2019-02-14 ENCOUNTER — Other Ambulatory Visit (INDEPENDENT_AMBULATORY_CARE_PROVIDER_SITE_OTHER): Payer: Medicare Other

## 2019-02-14 DIAGNOSIS — E875 Hyperkalemia: Secondary | ICD-10-CM | POA: Diagnosis not present

## 2019-02-14 DIAGNOSIS — E538 Deficiency of other specified B group vitamins: Secondary | ICD-10-CM

## 2019-02-14 DIAGNOSIS — E559 Vitamin D deficiency, unspecified: Secondary | ICD-10-CM

## 2019-02-14 DIAGNOSIS — R739 Hyperglycemia, unspecified: Secondary | ICD-10-CM

## 2019-02-14 LAB — CBC WITH DIFFERENTIAL/PLATELET
Basophils Absolute: 0.1 10*3/uL (ref 0.0–0.1)
Basophils Relative: 1.1 % (ref 0.0–3.0)
Eosinophils Absolute: 0.2 10*3/uL (ref 0.0–0.7)
Eosinophils Relative: 2.2 % (ref 0.0–5.0)
HCT: 38.4 % (ref 36.0–46.0)
Hemoglobin: 12.8 g/dL (ref 12.0–15.0)
Lymphocytes Relative: 26.8 % (ref 12.0–46.0)
Lymphs Abs: 2.3 10*3/uL (ref 0.7–4.0)
MCHC: 33.3 g/dL (ref 30.0–36.0)
MCV: 87.5 fl (ref 78.0–100.0)
Monocytes Absolute: 1.2 10*3/uL — ABNORMAL HIGH (ref 0.1–1.0)
Monocytes Relative: 13.8 % — ABNORMAL HIGH (ref 3.0–12.0)
Neutro Abs: 4.7 10*3/uL (ref 1.4–7.7)
Neutrophils Relative %: 56.1 % (ref 43.0–77.0)
Platelets: 337 10*3/uL (ref 150.0–400.0)
RBC: 4.38 Mil/uL (ref 3.87–5.11)
RDW: 14 % (ref 11.5–15.5)
WBC: 8.4 10*3/uL (ref 4.0–10.5)

## 2019-02-14 LAB — BASIC METABOLIC PANEL
BUN: 10 mg/dL (ref 6–23)
CO2: 31 mEq/L (ref 19–32)
Calcium: 8.4 mg/dL (ref 8.4–10.5)
Chloride: 102 mEq/L (ref 96–112)
Creatinine, Ser: 0.69 mg/dL (ref 0.40–1.20)
GFR: 84.14 mL/min (ref >60.00)
Glucose, Bld: 94 mg/dL (ref 70–99)
Potassium: 4 mEq/L (ref 3.5–5.1)
Sodium: 141 mEq/L (ref 135–145)

## 2019-02-14 LAB — VITAMIN B12: Vitamin B-12: >1500 pg/mL — ABNORMAL HIGH (ref 211–911)

## 2019-02-14 LAB — HEMOGLOBIN A1C: Hgb A1c MFr Bld: 5.7 % (ref 4.6–6.5)

## 2019-02-14 LAB — VITAMIN D 25 HYDROXY (VIT D DEFICIENCY, FRACTURES): VITD: 46.14 ng/mL (ref 30.00–100.00)

## 2019-02-15 ENCOUNTER — Telehealth: Payer: Self-pay | Admitting: Internal Medicine

## 2019-02-15 NOTE — Telephone Encounter (Signed)
rx refill phenytoin (DILANTIN) 100 MG ER capsule  cariprazine (VRAYLAR) capsule   Patient states she comes and picks up medication.  Call back [phone]

## 2019-02-15 NOTE — Telephone Encounter (Signed)
Pt notified Vryalar was reordered but the Dilantin is no longer covered and would like to know if the generic would be okay to send in? Please advise

## 2019-02-15 NOTE — Telephone Encounter (Signed)
See other TE.

## 2019-02-19 ENCOUNTER — Ambulatory Visit (INDEPENDENT_AMBULATORY_CARE_PROVIDER_SITE_OTHER): Payer: Medicare Other | Admitting: Psychology

## 2019-02-19 DIAGNOSIS — F331 Major depressive disorder, recurrent, moderate: Secondary | ICD-10-CM | POA: Diagnosis not present

## 2019-02-19 NOTE — Telephone Encounter (Signed)
OK w/me Thx 

## 2019-02-20 MED ORDER — PHENYTOIN SODIUM EXTENDED 100 MG PO CAPS: ORAL_CAPSULE | ORAL | 3 refills | Status: DC

## 2019-02-20 NOTE — Telephone Encounter (Signed)
RX sent

## 2019-02-21 ENCOUNTER — Other Ambulatory Visit: Payer: Self-pay

## 2019-02-21 ENCOUNTER — Ambulatory Visit (INDEPENDENT_AMBULATORY_CARE_PROVIDER_SITE_OTHER): Payer: Medicare Other | Admitting: Internal Medicine

## 2019-02-21 ENCOUNTER — Encounter: Payer: Self-pay | Admitting: Internal Medicine

## 2019-02-21 VITALS — BP 126/76 | HR 57 | Temp 97.8°F | Ht 60.0 in | Wt 147.0 lb

## 2019-02-21 DIAGNOSIS — I25111 Atherosclerotic heart disease of native coronary artery with angina pectoris with documented spasm: Secondary | ICD-10-CM | POA: Diagnosis not present

## 2019-02-21 DIAGNOSIS — F329 Major depressive disorder, single episode, unspecified: Secondary | ICD-10-CM

## 2019-02-21 DIAGNOSIS — J479 Bronchiectasis, uncomplicated: Secondary | ICD-10-CM | POA: Diagnosis not present

## 2019-02-21 DIAGNOSIS — E538 Deficiency of other specified B group vitamins: Secondary | ICD-10-CM | POA: Diagnosis not present

## 2019-02-21 DIAGNOSIS — R197 Diarrhea, unspecified: Secondary | ICD-10-CM

## 2019-02-21 DIAGNOSIS — F419 Anxiety disorder, unspecified: Secondary | ICD-10-CM

## 2019-02-21 DIAGNOSIS — J439 Emphysema, unspecified: Secondary | ICD-10-CM

## 2019-02-21 MED ORDER — CYANOCOBALAMIN 1000 MCG/ML IJ SOLN
1000.0000 ug | Freq: Once | INTRAMUSCULAR | Status: AC
  Administered 2019-02-21: 15:00:00 1000 ug via INTRAMUSCULAR

## 2019-02-21 MED ORDER — PROMETHAZINE-CODEINE 6.25-10 MG/5ML PO SOLN: 5.0000 mL | Freq: Four times a day (QID) | ORAL | 1 refills | Status: DC | PRN

## 2019-02-21 MED ORDER — CLONAZEPAM 1 MG PO TABS: ORAL_TABLET | ORAL | 3 refills | Status: DC

## 2019-02-21 MED ORDER — DIPHENOXYLATE-ATROPINE 2.5-0.025 MG PO TABS: 1.0000 | ORAL_TABLET | Freq: Four times a day (QID) | ORAL | 1 refills | Status: DC | PRN

## 2019-02-21 NOTE — Assessment & Plan Note (Signed)
Clonazepam prn  Potential benefits of a long term benzodiazepines  use as well as potential risks  and complications were explained to the patient and were aknowledged. Vraylar via assist program

## 2019-02-21 NOTE — Assessment & Plan Note (Signed)
On Lexapro  Vraylar low dose

## 2019-02-21 NOTE — Addendum Note (Signed)
Addended by: Trenda Moots on: [id number] 02:32 PM   Modules accepted: Orders

## 2019-02-21 NOTE — Progress Notes (Signed)
Subjective:  Patient ID: Veronica [name], female    DOB: [birth year]  Age: 68 y.o. MRN: [id number]  CC: No chief complaint on file.   HPI Veronica [name] presents for COPD, cough, IBS C/o diarrhea - worse x 2-3 days. No n/v. No fever. No recent abx.  Outpatient Medications Prior to Visit  Medication Sig Dispense Refill   acetaminophen-codeine (TYLENOL #3) 300-30 MG tablet Take 1 tablet by mouth every 4 (four) hours as needed. 60 tablet 3   albuterol (PROVENTIL) (2.5 MG/3ML) 0.083% nebulizer solution USE 1 VIAL IN NEBULIZER 2 TIMES DAILY. Generic: VENTOLIN 60 vial 5   albuterol (VENTOLIN HFA) 108 (90 Base) MCG/ACT inhaler INHALE 1 TO 2 PUFFS INTO THE LUNGS EVERY 4 HOURS AS NEEDED FOR WHEEZING OR SHORTNESS OF BREATH 8.5 g 3   aspirin 81 MG chewable tablet Chew by mouth daily.     atorvastatin (LIPITOR) 40 MG tablet TAKE 1 TABLET(40 MG) BY MOUTH DAILY 90 tablet 3   budesonide (PULMICORT) 0.25 MG/2ML nebulizer solution USE 1 VIAL IN NEBULIZER 2 TIMES DAILY. Generic: PULMICORT 120 mL 2   cariprazine (VRAYLAR) capsule Take 1 capsule (1.5 mg total) by mouth daily. 90 capsule 3   cetirizine (ZYRTEC) 5 MG tablet Take 1 tablet (5 mg total) by mouth daily. 30 tablet 1   Cholecalciferol (VITAMIN D3) 50 MCG (2000 UT) capsule Take 1 capsule (2,000 Units total) by mouth daily. 100 capsule 3   Cholecalciferol 1000 units tablet Take 1,000 Units by mouth daily.     clonazePAM (KLONOPIN) 1 MG tablet 2 po qhs and 1/2 tab in addition in daytime prn 225 tablet 3   cyanocobalamin (,VITAMIN B-12,) 1000 MCG/ML injection Inject 1 mL (1,000 mcg total) into the muscle every 14 (fourteen) days. 10 mL 5   Cyanocobalamin (VITAMIN B-12 PO) Take 5,000 mcg by mouth daily.     cyclobenzaprine (FLEXERIL) 10 MG tablet Take 1 tablet (10 mg total) by mouth 3 (three) times daily as needed for muscle spasms. 90 tablet 0   diphenoxylate-atropine (LOMOTIL) 2.5-0.025 MG tablet Take 1-2 tablets by mouth 4 (four) times  daily as needed for diarrhea or loose stools. 30 tablet 1   Docusate Calcium (STOOL SOFTENER PO) Take 100 mg by mouth as needed (for constipation).      escitalopram (LEXAPRO) 20 MG tablet TAKE 1 TABLET BY MOUTH DAILY 90 tablet 3   formoterol (PERFOROMIST) 20 MCG/2ML nebulizer solution Take 2 mLs (20 mcg total) by nebulization 2 (two) times daily. 120 mL 3   furosemide (LASIX) 20 MG tablet TAKE 1 TABLET(20 MG) BY MOUTH DAILY AS NEEDED 90 tablet 3   ketoconazole (NIZORAL) 2 % cream Apply 1 application topically 2 (two) times daily. 45 g 1   levothyroxine (SYNTHROID) 88 MCG tablet TAKE 1 TABLET(88 MCG) BY MOUTH DAILY 90 tablet 3   magic mouthwash SOLN Take 5 mLs by mouth 4 (four) times daily. Swish, hold and spit out---Do  Not swallow.  100 ml of dexamethasone 0.5 mg per 5 ml elixir 60 ml nystatin 100,000 Unit 100 ml of diphenhydramine 12.5 mg per 5 ml elixir 260 mL 3   nitroGLYCERIN (NITROSTAT) 0.4 MG SL tablet PLACE 1 TABLET UNDER THE TONGUE EVERY 5 MINUTES AS NEEDED FOR CHEST PAIN 25 tablet 3   ondansetron (ZOFRAN) 4 MG tablet Take 1 tablet (4 mg total) by mouth every 8 (eight) hours as needed for nausea or vomiting. 21 tablet 0   phenytoin (DILANTIN) 100 MG ER capsule 200  mg every morning and 100 mg every evening 270 capsule 3   promethazine (PHENERGAN) 25 MG tablet Take 0.5 tablets (12.5 mg total) by mouth every 6 (six) hours as needed. 60 tablet 1   Promethazine-Codeine 6.25-10 MG/5ML SOLN TAKE 5 ML BY MOUTH EVERY 6 HOURS AS NEEDED FOR COUGH 300 mL 0   Respiratory Therapy Supplies (FLUTTER) DEVI Use after nebulization treatments 1 each 0   sodium chloride (MURO 128) 5 % ophthalmic solution Place 1 drop into both eyes as needed for irritation.      sodium chloride HYPERTONIC 3 % nebulizer solution USE 1 VIAL VIA NEBULIZER TWICE DAILY 240 mL 2   SYRINGE-NEEDLE, DISP, 3 ML (BD ECLIPSE SYRINGE) 25G X 1" 3 ML MISC Use sq q 2 wks 50 each 3   traZODone (DESYREL) 50 MG tablet TAKE 4  TABLETS(200 MG) BY MOUTH AT BEDTIME 360 tablet 1   verapamil (CALAN-SR) 240 MG CR tablet TAKE 1 TABLET(240 MG) BY MOUTH AT BEDTIME 90 tablet 3   hydrochlorothiazide (MICROZIDE) 12.5 MG capsule Take 1 capsule (12.5 mg total) by mouth 3 (three) times a week. Mondays, Wednesdays, and Fridays 45 capsule 3   No facility-administered medications prior to visit.     ROS: Review of Systems  Constitutional: Positive for fatigue. Negative for activity change, appetite change, chills and unexpected weight change.  HENT: Negative for congestion, mouth sores and sinus pressure.   Eyes: Negative for visual disturbance.  Respiratory: Positive for cough and shortness of breath. Negative for chest tightness.   Cardiovascular: Negative for chest pain.  Gastrointestinal: Positive for diarrhea. Negative for abdominal distention, abdominal pain, nausea and vomiting.  Genitourinary: Negative for difficulty urinating, frequency and vaginal pain.  Musculoskeletal: Positive for arthralgias, back pain and gait problem.  Skin: Negative for pallor and rash.  Neurological: Positive for weakness. Negative for dizziness, tremors, numbness and headaches.  Psychiatric/Behavioral: Positive for decreased concentration and sleep disturbance. Negative for confusion and suicidal ideas. The patient is nervous/anxious.     Objective:  BP 126/76 (BP Location: Left Arm, Patient Position: Sitting, Cuff Size: Normal)    Pulse (!) 57    Temp 97.8 F (36.6 C) (Oral)    Ht 5' (1.524 m)    Wt 147 lb (66.7 kg)    SpO2 97%    BMI 28.71 kg/m   BP Readings from Last 3 Encounters:  02/21/19 126/76  02/01/19 132/80  11/23/18 (!) 146/76    Wt Readings from Last 3 Encounters:  02/21/19 147 lb (66.7 kg)  02/01/19 147 lb 6 oz (66.8 kg)  11/23/18 153 lb (69.4 kg)    Physical Exam Constitutional:      General: She is not in acute distress.    Appearance: She is well-developed.  HENT:     Head: Normocephalic.     Right Ear:  External ear normal.     Left Ear: External ear normal.     Nose: Nose normal.  Eyes:     General:        Right eye: No discharge.        Left eye: No discharge.     Conjunctiva/sclera: Conjunctivae normal.     Pupils: Pupils are equal, round, and reactive to light.  Neck:     Musculoskeletal: Normal range of motion and neck supple.     Thyroid: No thyromegaly.     Vascular: No JVD.     Trachea: No tracheal deviation.  Cardiovascular:     Rate and Rhythm:  Normal rate and regular rhythm.     Heart sounds: Normal heart sounds.  Pulmonary:     Effort: No respiratory distress.     Breath sounds: No stridor. No wheezing.  Abdominal:     General: Bowel sounds are normal. There is no distension.     Palpations: Abdomen is soft. There is no mass.     Tenderness: There is no abdominal tenderness. There is no guarding or rebound.  Musculoskeletal:        General: Tenderness present.  Lymphadenopathy:     Cervical: No cervical adenopathy.  Skin:    Findings: No erythema or rash.  Neurological:     Mental Status: She is oriented to person, place, and time.     Cranial Nerves: No cranial nerve deficit.     Motor: Weakness present. No abnormal muscle tone.     Coordination: Coordination normal.     Gait: Gait abnormal.     Deep Tendon Reflexes: Reflexes normal.  Psychiatric:        Behavior: Behavior normal.        Thought Content: Thought content normal.        Judgment: Judgment normal.    cane Lab Results  Component Value Date   WBC 8.4 02/14/2019   HGB 12.8 02/14/2019   HCT 38.4 02/14/2019   PLT 337.0 02/14/2019   GLUCOSE 94 02/14/2019   CHOL 249 (H) 11/10/2018   TRIG 88.0 11/10/2018   HDL 57.70 11/10/2018   LDLDIRECT 165.0 09/26/2009   LDLCALC 174 (H) 11/10/2018   ALT 8 11/10/2018   AST 12 11/10/2018   NA 141 02/14/2019   K 4.0 02/14/2019   CL 102 02/14/2019   CREATININE 0.69 02/14/2019   BUN 10 02/14/2019   CO2 31 02/14/2019   TSH 2.22 11/10/2018   INR 1.0  ratio 11/07/2009   HGBA1C 5.7 02/14/2019    Mr 3d Recon At Scanner  Result Date: 02/12/2019 CLINICAL DATA:  Right upper quadrant abdominal pain. Suspected cholecystitis. EXAM: MRI ABDOMEN WITHOUT AND WITH CONTRAST (INCLUDING MRCP) TECHNIQUE: Multiplanar multisequence MR imaging of the abdomen was performed both before and after the administration of intravenous contrast. Heavily T2-weighted images of the biliary and pancreatic ducts were obtained, and three-dimensional MRCP images were rendered by post processing. CONTRAST:  20mL GADAVIST GADOBUTROL 1 MMOL/ML IV SOLN COMPARISON:  Abdominal ultrasound 08/07/2015. Abdominal CT 08/31/2010. FINDINGS: Despite efforts by the technologist and patient, mild motion artifact is present on today's exam and could not be eliminated. This reduces exam sensitivity and specificity. Lower chest: There are no significant findings within the visualized lower chest. Hepatobiliary: Small hepatic cysts are noted. There are new suspicious hepatic findings. The gallbladder is surgically absent. There is mild fusiform extrahepatic biliary dilatation with the common hepatic duct measuring up to 8 mm in diameter. The duct tapers distally and appears similar to previous CT. No evidence of choledocholithiasis or intrahepatic biliary dilatation. Pancreas: Unremarkable. No pancreatic ductal dilatation or surrounding inflammatory changes. Stable prominent fat between the pancreatic head and the superior mesenteric vein. Spleen: Normal in size without focal abnormality. Adrenals/Urinary Tract: Both adrenal glands appear normal. Small right renal cyst. No evidence of renal mass or hydronephrosis. Stomach/Bowel: No evidence of bowel wall thickening, distention or surrounding inflammatory change. Vascular/Lymphatic: There are no enlarged abdominal lymph nodes. Aortic and branch vessel atherosclerosis. No acute vascular findings identified. Other: The visualized anterior abdominal wall appears  intact. No ascites. Musculoskeletal: No acute or significant osseous findings. Facet hypertrophy and  associated synovial enhancement noted within the mid lumbar spine. IMPRESSION: 1. No acute findings or explanation for the patient's symptoms. 2. Mild fusiform extrahepatic biliary dilatation status post cholecystectomy, similar to previous CT. No evidence of choledocholithiasis or intrahepatic biliary dilatation. 3.  Aortic Atherosclerosis (ICD10-I70.0). Electronically Signed   By: Richardean Sale M.D.   On: 02/12/2019 13:07   Mr Abd/mrcp  Result Date: 02/12/2019 CLINICAL DATA:  Right upper quadrant abdominal pain. Suspected cholecystitis. EXAM: MRI ABDOMEN WITHOUT AND WITH CONTRAST (INCLUDING MRCP) TECHNIQUE: Multiplanar multisequence MR imaging of the abdomen was performed both before and after the administration of intravenous contrast. Heavily T2-weighted images of the biliary and pancreatic ducts were obtained, and three-dimensional MRCP images were rendered by post processing. CONTRAST:  59mL GADAVIST GADOBUTROL 1 MMOL/ML IV SOLN COMPARISON:  Abdominal ultrasound 08/07/2015. Abdominal CT 08/31/2010. FINDINGS: Despite efforts by the technologist and patient, mild motion artifact is present on today's exam and could not be eliminated. This reduces exam sensitivity and specificity. Lower chest: There are no significant findings within the visualized lower chest. Hepatobiliary: Small hepatic cysts are noted. There are new suspicious hepatic findings. The gallbladder is surgically absent. There is mild fusiform extrahepatic biliary dilatation with the common hepatic duct measuring up to 8 mm in diameter. The duct tapers distally and appears similar to previous CT. No evidence of choledocholithiasis or intrahepatic biliary dilatation. Pancreas: Unremarkable. No pancreatic ductal dilatation or surrounding inflammatory changes. Stable prominent fat between the pancreatic head and the superior mesenteric vein.  Spleen: Normal in size without focal abnormality. Adrenals/Urinary Tract: Both adrenal glands appear normal. Small right renal cyst. No evidence of renal mass or hydronephrosis. Stomach/Bowel: No evidence of bowel wall thickening, distention or surrounding inflammatory change. Vascular/Lymphatic: There are no enlarged abdominal lymph nodes. Aortic and branch vessel atherosclerosis. No acute vascular findings identified. Other: The visualized anterior abdominal wall appears intact. No ascites. Musculoskeletal: No acute or significant osseous findings. Facet hypertrophy and associated synovial enhancement noted within the mid lumbar spine. IMPRESSION: 1. No acute findings or explanation for the patient's symptoms. 2. Mild fusiform extrahepatic biliary dilatation status post cholecystectomy, similar to previous CT. No evidence of choledocholithiasis or intrahepatic biliary dilatation. 3.  Aortic Atherosclerosis (ICD10-I70.0). Electronically Signed   By: Richardean Sale M.D.   On: 02/12/2019 13:07    Assessment & Plan:   There are no diagnoses linked to this encounter.   No orders of the defined types were placed in this encounter.    Follow-up: No follow-ups on file.  Walker Kehr, MD

## 2019-02-21 NOTE — Assessment & Plan Note (Signed)
Prom - cod syrup prn

## 2019-02-21 NOTE — Assessment & Plan Note (Signed)
Prom-cod for cough prn  Potential benefits of a long term opioids use as well as potential risks (i.e. addiction risk, apnea etc) and complications (i.e. Somnolence, constipation and others) were explained to the patient and were aknowledged.

## 2019-02-21 NOTE — Assessment & Plan Note (Signed)
Relapsed Lomotil prn C diff test

## 2019-02-21 NOTE — Addendum Note (Signed)
Addended by: Karren Cobble on: 02/21/2019 02:34 PM   Modules accepted: Orders

## 2019-02-25 ENCOUNTER — Other Ambulatory Visit: Payer: Self-pay | Admitting: Internal Medicine
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
## 2019-02-27 ENCOUNTER — Ambulatory Visit (INDEPENDENT_AMBULATORY_CARE_PROVIDER_SITE_OTHER): Payer: Medicare Other | Admitting: *Deleted

## 2019-02-27 ENCOUNTER — Other Ambulatory Visit: Payer: Self-pay

## 2019-02-27 VITALS — BP 124/82 | HR 60 | Resp 17 | Ht 60.0 in | Wt 151.0 lb

## 2019-02-27 DIAGNOSIS — Z0000 Encounter for general adult medical examination without abnormal findings: Secondary | ICD-10-CM

## 2019-02-27 NOTE — Patient Instructions (Addendum)
Continue to eat heart healthy diet (full of fruits, vegetables, whole grains, lean protein, water--limit salt, fat, and sugar intake) and increase physical activity as tolerated.   Veronica [name] , Thank you for taking time to come for your Medicare Wellness Visit. I appreciate your ongoing commitment to your health goals. Please review the following plan we discussed and let me know if I can assist you in the future.   These are the goals we discussed: Goals    . Patient Stated     Be more socially active, I will go to [name] and do something I enjoy and continue to watch Veronica [name] and/or start to visit churches     . Patient Stated     Make myself feel better by letting go of the past, help others as much as possible, write down 3 things I am thankful for daily, continue to go to church. Get up daily and go some place and perhaps look into the [name] for activities. Go to Christmas program at [name].    . Patient Stated     Check into doing a cake decorating class. Work harder on my relationship with my brother and try to let go of more of the past. Continue to write down 3 things that I am thankful for everyday.          This is a list of the screening recommended for you and due dates:  Health Maintenance  Topic Date Due  .  Hepatitis C: One time screening is recommended by Center for Disease Control  (CDC) for  adults born from [birth year] through 1966.   [birth year]  . Urine Protein Check  03/24/1959  . Colon Cancer Screening  01/19/2019  . Mammogram  04/14/2019  . Tetanus Vaccine  02/12/2024  . Flu Shot  Completed  . DEXA scan (bone density measurement)  Completed  . Pneumonia vaccines  Completed    Preventive Care 59 Years and Older, Female Preventive care refers to lifestyle choices and visits with your health care provider that can promote health and wellness. This includes:  A yearly physical exam. This is also called an annual well check.  Regular  dental and eye exams.  Immunizations.  Screening for certain conditions.  Healthy lifestyle choices, such as diet and exercise. What can I expect for my preventive care visit? Physical exam Your health care provider will check:  Height and weight. These may be used to calculate body mass index (BMI), which is a measurement that tells if you are at a healthy weight.  Heart rate and blood pressure.  Your skin for abnormal spots. Counseling Your health care provider may ask you questions about:  Alcohol, tobacco, and drug use.  Emotional well-being.  Home and relationship well-being.  Sexual activity.  Eating habits.  History of falls.  Memory and ability to understand (cognition).  Work and work Statistician.  Pregnancy and menstrual history. What immunizations do I need?  Influenza (flu) vaccine  This is recommended every year. Tetanus, diphtheria, and pertussis (Tdap) vaccine  You may need a Td booster every 10 years. Varicella (chickenpox) vaccine  You may need this vaccine if you have not already been vaccinated. Zoster (shingles) vaccine  You may need this after age 5. Pneumococcal conjugate (PCV13) vaccine  One dose is recommended after age 71. Pneumococcal polysaccharide (PPSV23) vaccine  One dose is recommended after age 11. Measles, mumps, and rubella (MMR) vaccine  You may need at least one dose  of MMR if you were born in 1957 or later. You may also need a second dose. Meningococcal conjugate (MenACWY) vaccine  You may need this if you have certain conditions. Hepatitis A vaccine  You may need this if you have certain conditions or if you travel or work in places where you may be exposed to hepatitis A. Hepatitis B vaccine  You may need this if you have certain conditions or if you travel or work in places where you may be exposed to hepatitis B. Haemophilus influenzae type b (Hib) vaccine  You may need this if you have certain  conditions. You may receive vaccines as individual doses or as more than one vaccine together in one shot (combination vaccines). Talk with your health care provider about the risks and benefits of combination vaccines. What tests do I need? Blood tests  Lipid and cholesterol levels. These may be checked every 5 years, or more frequently depending on your overall health.  Hepatitis C test.  Hepatitis B test. Screening  Lung cancer screening. You may have this screening every year starting at age 1 if you have a 30-pack-year history of smoking and currently smoke or have quit within the past 15 years.  Colorectal cancer screening. All adults should have this screening starting at age 76 and continuing until age 62. Your health care provider may recommend screening at age 69 if you are at increased risk. You will have tests every 1-10 years, depending on your results and the type of screening test.  Diabetes screening. This is done by checking your blood sugar (glucose) after you have not eaten for a while (fasting). You may have this done every 1-3 years.  Mammogram. This may be done every 1-2 years. Talk with your health care provider about how often you should have regular mammograms.  BRCA-related cancer screening. This may be done if you have a family history of breast, ovarian, tubal, or peritoneal cancers. Other tests  Sexually transmitted disease (STD) testing.  Bone density scan. This is done to screen for osteoporosis. You may have this done starting at age 79. Follow these instructions at home: Eating and drinking  Eat a diet that includes fresh fruits and vegetables, whole grains, lean protein, and low-fat dairy products. Limit your intake of foods with high amounts of sugar, saturated fats, and salt.  Take vitamin and mineral supplements as recommended by your health care provider.  Do not drink alcohol if your health care provider tells you not to drink.  If you drink  alcohol: ? Limit how much you have to 0-1 drink a day. ? Be aware of how much alcohol is in your drink. In the [place] one drink equals one 12 oz bottle of beer (355 mL), one 5 oz glass of Veronica [name] (148 mL), or one 1 oz glass of hard liquor (44 mL). Lifestyle  Take daily care of your teeth and gums.  Stay active. Exercise for at least 30 minutes on 5 or more days each week.  Do not use any products that contain nicotine or tobacco, such as cigarettes, e-cigarettes, and chewing tobacco. If you need help quitting, ask your health care provider.  If you are sexually active, practice safe sex. Use a condom or other form of protection in order to prevent STIs (sexually transmitted infections).  Talk with your health care provider about taking a low-dose aspirin or statin. What's next?  Go to your health care provider once a year for a well check visit.  Ask your health care provider how often you should have your eyes and teeth checked.  Stay up to date on all vaccines. This information is not intended to replace advice given to you by your health care provider. Make sure you discuss any questions you have with your health care provider. Document Released: 03/28/2015 Document Revised: 02/23/2018 Document Reviewed: 02/23/2018 Elsevier Patient Education  2020 [employer].

## 2019-02-28 ENCOUNTER — Other Ambulatory Visit: Payer: Self-pay

## 2019-02-28 ENCOUNTER — Ambulatory Visit (AMBULATORY_SURGERY_CENTER): Payer: Medicare Other | Admitting: *Deleted

## 2019-02-28 VITALS — Temp 96.6°F | Ht 60.0 in | Wt 150.0 lb

## 2019-02-28 DIAGNOSIS — R1011 Right upper quadrant pain: Secondary | ICD-10-CM

## 2019-02-28 DIAGNOSIS — Z86010 Personal history of colonic polyps: Secondary | ICD-10-CM

## 2019-02-28 DIAGNOSIS — Z1159 Encounter for screening for other viral diseases: Secondary | ICD-10-CM

## 2019-02-28 DIAGNOSIS — Z800 Family history of malignant neoplasm of digestive organs: Secondary | ICD-10-CM

## 2019-02-28 MED ORDER — NA SULFATE-K SULFATE-MG SULF 17.5-3.13-1.6 GM/177ML PO SOLN: 1.0000 | Freq: Once | ORAL | 0 refills | Status: AC

## 2019-02-28 NOTE — Progress Notes (Signed)

## 2019-03-01 ENCOUNTER — Ambulatory Visit (INDEPENDENT_AMBULATORY_CARE_PROVIDER_SITE_OTHER): Payer: Medicare Other

## 2019-03-01 DIAGNOSIS — M810 Age-related osteoporosis without current pathological fracture: Secondary | ICD-10-CM

## 2019-03-01 MED ORDER — DENOSUMAB 60 MG/ML ~~LOC~~ SOSY
60.0000 mg | PREFILLED_SYRINGE | Freq: Once | SUBCUTANEOUS | Status: AC
  Administered 2019-03-01: 60 mg via SUBCUTANEOUS

## 2019-03-05 ENCOUNTER — Ambulatory Visit (INDEPENDENT_AMBULATORY_CARE_PROVIDER_SITE_OTHER): Payer: Medicare Other | Admitting: Psychology

## 2019-03-05 DIAGNOSIS — F331 Major depressive disorder, recurrent, moderate: Secondary | ICD-10-CM

## 2019-03-19 ENCOUNTER — Ambulatory Visit (INDEPENDENT_AMBULATORY_CARE_PROVIDER_SITE_OTHER): Payer: Medicare Other | Admitting: Psychology

## 2019-03-19 DIAGNOSIS — F331 Major depressive disorder, recurrent, moderate: Secondary | ICD-10-CM

## 2019-03-21 ENCOUNTER — Ambulatory Visit (INDEPENDENT_AMBULATORY_CARE_PROVIDER_SITE_OTHER): Payer: Medicare Other

## 2019-03-21 ENCOUNTER — Other Ambulatory Visit: Payer: Self-pay | Admitting: Internal Medicine

## 2019-03-21 DIAGNOSIS — Z1159 Encounter for screening for other viral diseases: Secondary | ICD-10-CM

## 2019-03-22 LAB — SARS CORONAVIRUS 2 (TAT 6-24 HRS): SARS Coronavirus 2: NEGATIVE

## 2019-03-23 ENCOUNTER — Other Ambulatory Visit: Payer: Self-pay

## 2019-03-23 ENCOUNTER — Encounter: Payer: Self-pay | Admitting: Internal Medicine

## 2019-03-23 ENCOUNTER — Ambulatory Visit (AMBULATORY_SURGERY_CENTER): Payer: Medicare Other | Admitting: Internal Medicine

## 2019-03-23 VITALS — BP 130/64 | HR 55 | Temp 98.4°F | Resp 31

## 2019-03-23 DIAGNOSIS — D123 Benign neoplasm of transverse colon: Secondary | ICD-10-CM

## 2019-03-23 DIAGNOSIS — D120 Benign neoplasm of cecum: Secondary | ICD-10-CM

## 2019-03-23 DIAGNOSIS — D124 Benign neoplasm of descending colon: Secondary | ICD-10-CM

## 2019-03-23 DIAGNOSIS — R197 Diarrhea, unspecified: Secondary | ICD-10-CM | POA: Diagnosis not present

## 2019-03-23 DIAGNOSIS — Z86010 Personal history of colonic polyps: Secondary | ICD-10-CM

## 2019-03-23 DIAGNOSIS — R1011 Right upper quadrant pain: Secondary | ICD-10-CM | POA: Diagnosis present

## 2019-03-23 DIAGNOSIS — Z800 Family history of malignant neoplasm of digestive organs: Secondary | ICD-10-CM

## 2019-03-23 MED ORDER — SODIUM CHLORIDE 0.9 % IV SOLN: 500.0000 mL | Freq: Once | INTRAVENOUS | Status: DC

## 2019-03-23 NOTE — Progress Notes (Signed)
Pt tolerated well. VSS. Awake and to recovery. 

## 2019-03-23 NOTE — Op Note (Signed)
[facility] Patient Name: Veronica [name] Procedure Date: 03/23/2019 1:50 PM MRN: [id number] Endoscopist: Jerene Bears , MD Age: 69 Referring MD:  Date of Birth: Feb 11, 1951 Gender: Female Account #: [id number] Procedure:                Upper GI endoscopy Indications:              Abdominal pain in the right upper quadrant, Diarrhea Medicines:                Monitored Anesthesia Care Procedure:                Pre-Anesthesia Assessment:                           - Prior to the procedure, a History and Physical                            was performed, and patient medications and                            allergies were reviewed. The patient's tolerance of                            previous anesthesia was also reviewed. The risks                            and benefits of the procedure and the sedation                            options and risks were discussed with the patient.                            All questions were answered, and informed consent                            was obtained. Prior Anticoagulants: The patient has                            taken no previous anticoagulant or antiplatelet                            agents. ASA Grade Assessment: III - A patient with                            severe systemic disease. After reviewing the risks                            and benefits, the patient was deemed in                            satisfactory condition to undergo the procedure.                           After obtaining informed consent, the endoscope was  passed under direct vision. Throughout the                            procedure, the patient's blood pressure, pulse, and                            oxygen saturations were monitored continuously. The                            Endoscope was introduced through the mouth, and                            advanced to the second part of duodenum. The upper                            GI  endoscopy was accomplished without difficulty.                            The patient tolerated the procedure well. Scope In: Scope Out: Findings:                 The examined esophagus was normal.                           Evidence of a Nissen fundoplication was found in                            the cardia. The wrap appeared intact.                           Mildly erythematous mucosa without bleeding was                            found in the gastric antrum. Biopsies were taken                            with a cold forceps for histology and Helicobacter                            pylori testing.                           The examined duodenum was normal. Biopsies were                            taken with a cold forceps for histology. Complications:            No immediate complications. Estimated Blood Loss:     Estimated blood loss was minimal. Impression:               - Normal esophagus.                           - A Nissen fundoplication was found. The wrap  appears intact.                           - Erythematous mucosa in the antrum. Biopsied.                           - Normal examined duodenum. Biopsied. Recommendation:           - Patient has a contact number available for                            emergencies. The signs and symptoms of potential                            delayed complications were discussed with the                            patient. Return to normal activities tomorrow.                            Written discharge instructions were provided to the                            patient.                           - Resume previous diet.                           - Continue present medications.                           - Await pathology results. Jerene Bears, MD 03/23/2019 2:30:12 PM This report has been signed electronically.

## 2019-03-23 NOTE — Op Note (Signed)
[facility] Patient Name: Veronica [name] Procedure Date: 03/23/2019 1:50 PM MRN: [id number] Endoscopist: Jerene Bears , MD Age: 69 Referring MD:  Date of Birth: [birth year] Gender: Female Account #: [id number] Procedure:                Colonoscopy Indications:              High risk colon cancer surveillance: Personal                            history of sessile serrated colon polyp (less than                            10 mm in size) with no dysplasia, Family history of                            colon cancer in a first-degree relative before age                            31 years, Last colonoscopy: November 2015; diarrhea Medicines:                Monitored Anesthesia Care Procedure:                Pre-Anesthesia Assessment:                           - Prior to the procedure, a History and Physical                            was performed, and patient medications and                            allergies were reviewed. The patient's tolerance of                            previous anesthesia was also reviewed. The risks                            and benefits of the procedure and the sedation                            options and risks were discussed with the patient.                            All questions were answered, and informed consent                            was obtained. Prior Anticoagulants: The patient has                            taken no previous anticoagulant or antiplatelet                            agents. ASA Grade Assessment: III - A patient with  severe systemic disease. After reviewing the risks                            and benefits, the patient was deemed in                            satisfactory condition to undergo the procedure.                           After obtaining informed consent, the colonoscope                            was passed under direct vision. Throughout the                            procedure,  the patient's blood pressure, pulse, and                            oxygen saturations were monitored continuously. The                            Colonoscope was introduced through the anus and                            advanced to the the cecum, identified by                            appendiceal orifice and ileocecal valve. The                            colonoscopy was performed without difficulty. The                            patient tolerated the procedure well. The quality                            of the bowel preparation was good. The ileocecal                            valve, appendiceal orifice, and rectum were                            photographed. Scope In: 2:11:53 PM Scope Out: 2:25:49 PM Scope Withdrawal Time: 0 hours 11 minutes 44 seconds  Total Procedure Duration: 0 hours 13 minutes 56 seconds  Findings:                 The digital rectal exam was normal.                           A 5 mm polyp was found in the cecum. The polyp was                            sessile. The polyp was removed with a cold snare.  Resection and retrieval were complete.                           A 4 mm polyp was found in the transverse colon. The                            polyp was sessile. The polyp was removed with a                            cold snare. Resection and retrieval were complete.                           A 4 mm polyp was found in the descending colon. The                            polyp was sessile. The polyp was removed with a                            cold snare. Resection and retrieval were complete.                           The exam was otherwise without abnormality on                            direct and retroflexion views.                           Biopsies for histology were taken with a cold                            forceps from the right colon and left colon for                            evaluation of microscopic  colitis. Complications:            No immediate complications. Estimated Blood Loss:     Estimated blood loss was minimal. Impression:               - One 5 mm polyp in the cecum, removed with a cold                            snare. Resected and retrieved.                           - One 4 mm polyp in the transverse colon, removed                            with a cold snare. Resected and retrieved.                           - One 4 mm polyp in the descending colon, removed  with a cold snare. Resected and retrieved.                           - The examination was otherwise normal on direct                            and retroflexion views.                           - Biopsies were taken with a cold forceps from the                            right colon and left colon for evaluation of                            microscopic colitis. Recommendation:           - Patient has a contact number available for                            emergencies. The signs and symptoms of potential                            delayed complications were discussed with the                            patient. Return to normal activities tomorrow.                            Written discharge instructions were provided to the                            patient.                           - Resume previous diet.                           - Continue present medications.                           - Await pathology results.                           - Repeat colonoscopy is recommended for                            surveillance. The colonoscopy date will be                            determined after pathology results from today's                            exam become available for review. Jerene Bears, MD 03/23/2019 2:32:37 PM This report has been signed electronically.

## 2019-03-23 NOTE — Progress Notes (Signed)
VS- [facility] on 03-13-19- took one tablet and it relieved her chest discomfort- [name] CRNA made aware  Pt's states no medical or surgical changes since previsit or office visit.  Called to room to assist during endoscopic procedure.  Patient ID and intended procedure confirmed with present staff. Received instructions for my participation in the procedure from the performing physician.

## 2019-03-23 NOTE — Patient Instructions (Signed)
YOU HAD AN ENDOSCOPIC PROCEDURE TODAY AT THE [facility]:   Refer to the procedure report that was given to you for any specific questions about what was found during the examination.  If the procedure report does not answer your questions, please call your gastroenterologist to clarify.  If you requested that your care partner not be given the details of your procedure findings, then the procedure report has been included in a sealed envelope for you to review at your convenience later.  YOU SHOULD EXPECT: Some feelings of bloating in the abdomen. Passage of more gas than usual.  Walking can help get rid of the air that was put into your GI tract during the procedure and reduce the bloating. If you had a lower endoscopy (such as a colonoscopy or flexible sigmoidoscopy) you may notice spotting of blood in your stool or on the toilet paper. If you underwent a bowel prep for your procedure, you may not have a normal bowel movement for a few days.  Please Note:  You might notice some irritation and congestion in your nose or some drainage.  This is from the oxygen used during your procedure.  There is no need for concern and it should clear up in a day or so.  SYMPTOMS TO REPORT IMMEDIATELY:   Following lower endoscopy (colonoscopy or flexible sigmoidoscopy):  Excessive amounts of blood in the stool  Significant tenderness or worsening of abdominal pains  Swelling of the abdomen that is new, acute  Fever of 100F or higher   Following upper endoscopy (EGD)  Vomiting of blood or coffee ground material  New chest pain or pain under the shoulder blades  Painful or persistently difficult swallowing  New shortness of breath  Fever of 100F or higher  Black, tarry-looking stools  For urgent or emergent issues, a gastroenterologist can be reached at any hour by calling [phone].   DIET:  We do recommend a small meal at first, but then you may proceed to your regular diet.  Drink  plenty of fluids but you should avoid alcoholic beverages for 24 hours.  ACTIVITY:  You should plan to take it easy for the rest of today and you should NOT DRIVE or use heavy machinery until tomorrow (because of the sedation medicines used during the test).    FOLLOW UP: Our staff will call the number listed on your records 48-72 hours following your procedure to check on you and address any questions or concerns that you may have regarding the information given to you following your procedure. If we do not reach you, we will leave a message.  We will attempt to reach you two times.  During this call, we will ask if you have developed any symptoms of COVID 19. If you develop any symptoms (ie: fever, flu-like symptoms, shortness of breath, cough etc.) before then, please call [phone].  If you test positive for Covid 19 in the 2 weeks post procedure, please call and report this information to us.    If any biopsies were taken you will be contacted by phone or by letter within the next 1-3 weeks.  Please call us at [phone] if you have not heard about the biopsies in 3 weeks.    SIGNATURES/CONFIDENTIALITY: You and/or your care partner have signed paperwork which will be entered into your electronic medical record.  These signatures attest to the fact that that the information above on your After Visit Summary has been reviewed and is   understood.  Full responsibility of the confidentiality of this discharge information lies with you and/or your care-partner. 

## 2019-03-27 ENCOUNTER — Telehealth: Payer: Self-pay | Admitting: *Deleted

## 2019-03-27 NOTE — Telephone Encounter (Signed)
  Follow up Call-  Call back number 03/23/2019  Post procedure Call Back phone  # [phone]  Permission to leave phone message Yes  Some recent data might be hidden     Patient questions:  Do you have a fever, pain , or abdominal swelling? No. Pain Score  0 *  Have you tolerated food without any problems? Yes.    Have you been able to return to your normal activities? Yes.    Do you have any questions about your discharge instructions: Diet   No. Medications  No. Follow up visit  No.  Do you have questions or concerns about your Care? No.  Actions: * If pain score is 4 or above: No action needed, pain <4.  1. Have you developed a fever since your procedure? no  2.   Have you had an respiratory symptoms (SOB or cough) since your procedure? no  3.   Have you tested positive for COVID 19 since your procedure no  4.   Have you had any family members/close contacts diagnosed with the COVID 19 since your procedure?  no   If yes to any of these questions please route to Joylene John, RN and Alphonsa Gin, Therapist, sports.

## 2019-03-28 NOTE — Progress Notes (Signed)
Medical screening examination/treatment/procedure(s) were performed by non-physician practitioner and as supervising physician I was immediately available for consultation/collaboration. I agree with above. Kreig Parson, MD  

## 2019-04-02 ENCOUNTER — Ambulatory Visit (INDEPENDENT_AMBULATORY_CARE_PROVIDER_SITE_OTHER): Payer: Medicare Other | Admitting: Psychology

## 2019-04-02 DIAGNOSIS — F331 Major depressive disorder, recurrent, moderate: Secondary | ICD-10-CM | POA: Diagnosis not present

## 2019-04-05 ENCOUNTER — Encounter: Payer: Self-pay | Admitting: Internal Medicine

## 2019-04-11 ENCOUNTER — Other Ambulatory Visit: Payer: Self-pay | Admitting: Internal Medicine

## 2019-04-16 ENCOUNTER — Ambulatory Visit (INDEPENDENT_AMBULATORY_CARE_PROVIDER_SITE_OTHER): Payer: Medicare Other | Admitting: Psychology

## 2019-04-16 DIAGNOSIS — F331 Major depressive disorder, recurrent, moderate: Secondary | ICD-10-CM

## 2019-04-23 ENCOUNTER — Telehealth: Payer: Self-pay | Admitting: Internal Medicine

## 2019-04-23 NOTE — Telephone Encounter (Signed)
       1. Which medications need to be refilled? (please list name of each medication and dose if known)cariprazine (VRAYLAR) capsule  2. Which pharmacy/location (including street and city if local pharmacy) is medication to be sent to? Patient assistance , pick up from office

## 2019-04-24 ENCOUNTER — Other Ambulatory Visit: Payer: Self-pay | Admitting: Internal Medicine

## 2019-04-24 NOTE — Telephone Encounter (Signed)
Rx refilled and will be sent to patients house

## 2019-04-30 ENCOUNTER — Ambulatory Visit (INDEPENDENT_AMBULATORY_CARE_PROVIDER_SITE_OTHER): Payer: Medicare Other | Admitting: Psychology

## 2019-04-30 DIAGNOSIS — F331 Major depressive disorder, recurrent, moderate: Secondary | ICD-10-CM

## 2019-05-03 ENCOUNTER — Other Ambulatory Visit: Payer: Self-pay | Admitting: *Deleted

## 2019-05-03 NOTE — Patient Outreach (Signed)
[facility]) Care Management  05/03/2019  Veronica [name] 1950/04/11 [id number]   Successful telephone outreach call to patient for screening. HIPAA identifiers obtained. Nurse discussed with patient [facility] program and services and patient would like to participate. Patient explained that she would not be able to talk today and asked that the nurse call her next week to complete the initial assessment.  RN Health Coach will send [place] will send [place] will call patient between 05/07/19 and 05/11/19 to complete the initial assessment and patient agrees to future outreach calls.   Emelia Loron RN, BSN [place] [phone] [email or web address]

## 2019-05-08 ENCOUNTER — Encounter: Payer: Self-pay | Admitting: *Deleted

## 2019-05-08 ENCOUNTER — Other Ambulatory Visit: Payer: Self-pay | Admitting: *Deleted

## 2019-05-08 NOTE — Patient Outreach (Signed)
[facility]) Care Management  05/08/2019   Veronica [name] 05-18-1950 [id number]  Successful telephone outreach call to patient. HIPAA identifiers obtained.  Social: Patient lives alone. She is independent with all ADLs , IADLs, and currently transports herself to all of her medical appointments. Patient denies having any recent falls and reports that her home environment is safe. Her Brother Darnell Level would be able to provide care for the patient if she were to need assistance and emotionally the patient is doing well at this time.  Conditions: Past medical history includes but is not limited to: COPD, asthma, depression, GERD, hyperlipidemia, hypertension, seizures, and stroke. Patient explained that she does have a long history of COPD. Her last bout of bronchitis was about 3 months ago but did not get severe and was treated with antibiotics. She has control inhalers and nebulizer which she uses daily and also reports that she uses her rescue inhaler up to 2 times daily. Patient reports that she keeps a chronic cough and does use a flutter device daily to help her expectorate her phlegm. She does drink nutritional supplements as needed but states she usually does not have too much difficulty eating her food and is maintaining her weight. RN did review COPD exacerbation zones and action plans with patient. Nurse encouraged patient to stay as active as tolerated and patient added that she does make the effort to stay active around her home and does her best to go out daily so she can continue to walk and maintain her strength.    Medications:  Patient takes 31 different medications and supplements daily of which she manages. She denies any problematic side effects and she does not have any questions or concerns regarding her medications at this time. Patient reports that she can afford her medications. [facility] pharmacy helped the patient in the past with medication cost assistance.    Appointments:  Last PCP visit was 02/21/19 and patient reports that she typically sees Dr. Alain Marion every 3-4 months. She last saw pulmonologist Dr. Lake Bells  07/20/17. She explained that often her PCP does treat her COPD.  Advanced Directives: Patient states that she does have Advanced Directives both a living will and healthcare power of attorney documents. Nurse did ask that the patient bring in a copy of the documents to her PCP's office to have scanned into the [place].    Consent:  [facility] services reviewed and discussed with patient. Patient verbally agrees to participate in the [facility] program and to disease management outreaches.  Current Medications:  Current Outpatient Medications  Medication Sig Dispense Refill  . acetaminophen-codeine (TYLENOL #3) 300-30 MG tablet Take 1 tablet by mouth every 4 (four) hours as needed. 60 tablet 3  . albuterol (PROVENTIL) (2.5 MG/3ML) 0.083% nebulizer solution USE 1 VIAL IN NEBULIZER 2 TIMES DAILY. Generic: VENTOLIN 60 vial 5  . albuterol (VENTOLIN HFA) 108 (90 Base) MCG/ACT inhaler INHALE 1 TO 2 PUFFS INTO THE LUNGS EVERY 4 HOURS AS NEEDED FOR WHEEZING OR SHORTNESS OF BREATH 8.5 g 3  . aspirin 81 MG chewable tablet Chew by mouth daily.    [name] atorvastatin (LIPITOR) 40 MG tablet TAKE 1 TABLET(40 MG) BY MOUTH DAILY 90 tablet 3  . budesonide (PULMICORT) 0.25 MG/2ML nebulizer solution USE 1 VIAL IN NEBULIZER 2 TIMES DAILY. Generic: PULMICORT 120 mL 2  . cariprazine (VRAYLAR) capsule Take 1 capsule (1.5 mg total) by mouth daily. 90 capsule 3  . cetirizine (ZYRTEC) 5 MG tablet Take 1 tablet (  5 mg total) by mouth daily. 30 tablet 1  . Cholecalciferol (VITAMIN D3) 50 MCG (2000 UT) capsule Take 1 capsule (2,000 Units total) by mouth daily. 100 capsule 3  . Cholecalciferol 1000 units tablet Take 1,000 Units by mouth daily.    . clonazePAM (KLONOPIN) 1 MG tablet 2 po qhs and 1/2 tab in addition in daytime prn 225 tablet 3  . cyanocobalamin (,VITAMIN B-12,)  1000 MCG/ML injection Inject 1 mL (1,000 mcg total) into the muscle every 14 (fourteen) days. 10 mL 5  . Cyanocobalamin (VITAMIN B-12 PO) Take 5,000 mcg by mouth daily.    . cyclobenzaprine (FLEXERIL) 10 MG tablet Take 1 tablet (10 mg total) by mouth 3 (three) times daily as needed for muscle spasms. 90 tablet 0  . diphenoxylate-atropine (LOMOTIL) 2.5-0.025 MG tablet Take 1-2 tablets by mouth 4 (four) times daily as needed for diarrhea or loose stools. 30 tablet 1  . Docusate Calcium (STOOL SOFTENER PO) Take 100 mg by mouth as needed (for constipation).     [name] escitalopram (LEXAPRO) 20 MG tablet TAKE 1 TABLET BY MOUTH DAILY 90 tablet 3  . formoterol (PERFOROMIST) 20 MCG/2ML nebulizer solution Take 2 mLs (20 mcg total) by nebulization 2 (two) times daily. 120 mL 3  . furosemide (LASIX) 20 MG tablet TAKE 1 TABLET(20 MG) BY MOUTH DAILY AS NEEDED 90 tablet 3  . ketoconazole (NIZORAL) 2 % cream Apply 1 application topically 2 (two) times daily. 45 g 1  . levothyroxine (SYNTHROID) 88 MCG tablet TAKE 1 TABLET(88 MCG) BY MOUTH DAILY 90 tablet 3  . magic mouthwash SOLN Take 5 mLs by mouth 4 (four) times daily. Swish, hold and spit out---Do  Not swallow.  100 ml of dexamethasone 0.5 mg per 5 ml elixir 60 ml nystatin 100,000 Unit 100 ml of diphenhydramine 12.5 mg per 5 ml elixir 260 mL 3  . nitroGLYCERIN (NITROSTAT) 0.4 MG SL tablet DISSOLVE 1 TABLET UNDER THE TONGUE EVERY 5 MINUTES AS NEEDED FOR CHEST PAIN 25 tablet 3  . ondansetron (ZOFRAN) 4 MG tablet Take 1 tablet (4 mg total) by mouth every 8 (eight) hours as needed for nausea or vomiting. 21 tablet 0  . phenytoin (DILANTIN) 100 MG ER capsule 200 mg every morning and 100 mg every evening 270 capsule 3  . promethazine (PHENERGAN) 25 MG tablet Take 0.5 tablets (12.5 mg total) by mouth every 6 (six) hours as needed. 60 tablet 1  . Promethazine-Codeine 6.25-10 MG/5ML SOLN TAKE 5 ML BY MOUTH EVERY 6 HOURS AS NEEDED FOR COUGH 300 mL 0  . Respiratory Therapy  Supplies (FLUTTER) DEVI Use after nebulization treatments 1 each 0  . sodium chloride (MURO 128) 5 % ophthalmic solution Place 1 drop into both eyes as needed for irritation.     . sodium chloride HYPERTONIC 3 % nebulizer solution USE 1 VIAL VIA NEBULIZER TWICE DAILY 240 mL 2  . SYRINGE-NEEDLE, DISP, 3 ML (BD ECLIPSE SYRINGE) 25G X 1" 3 ML MISC Use sq q 2 wks 50 each 3  . traZODone (DESYREL) 50 MG tablet TAKE 4 TABLETS(200 MG) BY MOUTH AT BEDTIME 360 tablet 1  . verapamil (CALAN-SR) 240 MG CR tablet TAKE 1 TABLET(240 MG) BY MOUTH AT BEDTIME 90 tablet 3   No current facility-administered medications for this visit.    Functional Status:  In your present state of health, do you have any difficulty performing the following activities: 05/08/2019 02/27/2019  Hearing? N N  Vision? N N  Difficulty concentrating or  making decisions? N N  Walking or climbing stairs? N N  Dressing or bathing? N N  Doing errands, shopping? N N  Preparing Food and eating ? N N  Using the Toilet? N N  In the past six months, have you accidently leaked urine? N N  Do you have problems with loss of bowel control? N N  Managing your Medications? N N  Managing your Finances? N N  Housekeeping or managing your Housekeeping? N N  Some recent data might be hidden    Fall/Depression Screening: Fall Risk  05/08/2019 02/27/2019 02/07/2019  Falls in the past year? 1 0 1  Comment - - [place] Telephone Survey: data to providers prior to load  Number falls in past yr: 0 0 1  Comment - - [place] Telephone Survey Actual Response = 1  Injury with Fall? 0 0 0  Risk for fall due to : Impaired balance/gait - -  Follow up Falls prevention discussed;Education provided;Falls evaluation completed Falls prevention discussed -   PHQ 2/9 Scores 05/08/2019 02/27/2019 02/21/2018 03/04/2017 02/18/2017 02/11/2017 10/07/2015  PHQ - 2 Score 4 4 4 1 6 6  0  PHQ- 9 Score 7 8 9  - 13 9 -   THN CM Care Plan Problem One     Most Recent Value  Care  Plan Problem One  Impaierd gas exchange related to altered oxygen supply as evidenced by shortness or breath and reduced activity tolerance  Role Documenting the Problem One  [place] for Problem One  Active  [facility] Long Term Goal   patient will not have an admission for COPD exacerbation within the next 90 days  THN Long Term Goal Start Date  05/08/19  Interventions for Problem One Long Term Goal  RN discussed COPD exacerbation zones and action plans, encourage patient to take medications as prescribed, discussed using flutter deivice daily, encouraged patient to stay as  activite as tolerated, discussed continuing to drink nutritional supplements, RN will send COPD Gold Living Better with COPD packet to patient.  THN CM Short Term Goal #1   Patient will verbalize COPD exacerbation zones  within the next 30 days  THN CM Short Term Goal #1 Start Date  05/08/19  Interventions for Short Term Goal #1  RN will send COPD Gold Living Better with COPD  packet to patient, discussed COPD exacerbation zones and action plans with patient.  THN CM Short Term Goal #2   Patient will verbalized action plans for exacerbation within 30 days  THN CM Short Term Goal #2 Start Date  05/08/19  Interventions for Short Term Goal #2  RN discusses action plans for COPD exacerbation, RN sent educational material on COPD exacerbation, RN will follow up with further discussion      Plan: RN Health Coach will send [facility] Welcome packet RN Health Coach will send Health Coach letter RN Health Coach will send Barrier letter and assessment to PCP  RN Health Coach will send COPD Gold: Living Better with COPD RN Health Coach will send EMMI education on COPD exacerbation RN Health Coach will call patient within the month of March and patient agrees to future outreach calls.   Emelia Loron RN, BSN [place] [phone] [email or web address]

## 2019-05-10 ENCOUNTER — Other Ambulatory Visit: Payer: Self-pay | Admitting: *Deleted

## 2019-05-10 NOTE — Patient Outreach (Signed)
[facility]) Care Management  05/10/2019  Veronica [name] [birth year] [id number]   Unsuccessful outreach attempt made to patient to discuss covid-19 vaccine sign-up and life alert resource.  RN Health Coach left HIPAA compliant voicemail message along with her contact information.  Plan: RN Health Coach will call patient within the month of March  [place], [place] [phone] [email or web address]

## 2019-05-10 NOTE — Patient Outreach (Signed)
[facility]) Care Management  05/10/2019  Veronica [name] 1950/06/10 [id number]   Successful telephone outreach call to patient for care coordination. HIPAA identifiers obtained. Patient called nurse back in response to her voice message. Nurse informed patient that [place] program did not service her county. Patient explained that $20.00 a month for life alert services would be the max she could afford. Nurse stated that she would continue to inquire regarding other services that were in her price range. Nurse also placed patient on a waiting list to receive a covid-19 vaccination through [facility] covid vaccine program. Veronica [name] was very appreciative for the call.  Plan: RN Health Coach will make next telephone outreach to patient in the month of March  [place], BSN [place] [phone] [email or web address]

## 2019-05-14 ENCOUNTER — Ambulatory Visit (INDEPENDENT_AMBULATORY_CARE_PROVIDER_SITE_OTHER): Payer: Medicare Other | Admitting: Psychology

## 2019-05-14 DIAGNOSIS — F331 Major depressive disorder, recurrent, moderate: Secondary | ICD-10-CM | POA: Diagnosis not present

## 2019-05-22 ENCOUNTER — Other Ambulatory Visit: Payer: Self-pay

## 2019-05-22 ENCOUNTER — Ambulatory Visit (INDEPENDENT_AMBULATORY_CARE_PROVIDER_SITE_OTHER): Payer: Medicare Other | Admitting: Internal Medicine

## 2019-05-22 ENCOUNTER — Encounter: Payer: Self-pay | Admitting: Internal Medicine

## 2019-05-22 VITALS — BP 116/62 | HR 72 | Temp 99.0°F | Ht 60.0 in | Wt 153.0 lb

## 2019-05-22 DIAGNOSIS — E875 Hyperkalemia: Secondary | ICD-10-CM

## 2019-05-22 DIAGNOSIS — R569 Unspecified convulsions: Secondary | ICD-10-CM | POA: Diagnosis not present

## 2019-05-22 DIAGNOSIS — I25111 Atherosclerotic heart disease of native coronary artery with angina pectoris with documented spasm: Secondary | ICD-10-CM

## 2019-05-22 DIAGNOSIS — E559 Vitamin D deficiency, unspecified: Secondary | ICD-10-CM

## 2019-05-22 DIAGNOSIS — F419 Anxiety disorder, unspecified: Secondary | ICD-10-CM

## 2019-05-22 DIAGNOSIS — E162 Hypoglycemia, unspecified: Secondary | ICD-10-CM

## 2019-05-22 DIAGNOSIS — E785 Hyperlipidemia, unspecified: Secondary | ICD-10-CM

## 2019-05-22 DIAGNOSIS — J42 Unspecified chronic bronchitis: Secondary | ICD-10-CM | POA: Diagnosis not present

## 2019-05-22 DIAGNOSIS — L509 Urticaria, unspecified: Secondary | ICD-10-CM

## 2019-05-22 DIAGNOSIS — J449 Chronic obstructive pulmonary disease, unspecified: Secondary | ICD-10-CM

## 2019-05-22 DIAGNOSIS — E539 Vitamin B deficiency, unspecified: Secondary | ICD-10-CM

## 2019-05-22 LAB — BASIC METABOLIC PANEL
BUN: 7 mg/dL (ref 6–23)
CO2: 34 mEq/L — ABNORMAL HIGH (ref 19–32)
Calcium: 8.6 mg/dL (ref 8.4–10.5)
Chloride: 101 mEq/L (ref 96–112)
Creatinine, Ser: 0.64 mg/dL (ref 0.40–1.20)
GFR: 91.7 mL/min (ref >60.00)
Glucose, Bld: 70 mg/dL (ref 70–99)
Potassium: 4.2 mEq/L (ref 3.5–5.1)
Sodium: 140 mEq/L (ref 135–145)

## 2019-05-22 LAB — CBC WITH DIFFERENTIAL/PLATELET
Basophils Absolute: 0.1 10*3/uL (ref 0.0–0.1)
Basophils Relative: 0.6 % (ref 0.0–3.0)
Eosinophils Absolute: 0.2 10*3/uL (ref 0.0–0.7)
Eosinophils Relative: 1.6 % (ref 0.0–5.0)
HCT: 37.6 % (ref 36.0–46.0)
Hemoglobin: 12.4 g/dL (ref 12.0–15.0)
Lymphocytes Relative: 32.4 % (ref 12.0–46.0)
Lymphs Abs: 3.6 10*3/uL (ref 0.7–4.0)
MCHC: 32.9 g/dL (ref 30.0–36.0)
MCV: 87.6 fl (ref 78.0–100.0)
Monocytes Absolute: 1.7 10*3/uL — ABNORMAL HIGH (ref 0.1–1.0)
Monocytes Relative: 15.2 % — ABNORMAL HIGH (ref 3.0–12.0)
Neutro Abs: 5.6 10*3/uL (ref 1.4–7.7)
Neutrophils Relative %: 50.2 % (ref 43.0–77.0)
Platelets: 365 10*3/uL (ref 150.0–400.0)
RBC: 4.3 Mil/uL (ref 3.87–5.11)
RDW: 14.6 % (ref 11.5–15.5)
WBC: 11.1 10*3/uL — ABNORMAL HIGH (ref 4.0–10.5)

## 2019-05-22 LAB — HEMOGLOBIN A1C: Hgb A1c MFr Bld: 5.6 % (ref 4.6–6.5)

## 2019-05-22 LAB — HEPATIC FUNCTION PANEL
ALT: 12 U/L (ref 0–35)
AST: 16 U/L (ref 0–37)
Albumin: 4 g/dL (ref 3.5–5.2)
Alkaline Phosphatase: 124 U/L — ABNORMAL HIGH (ref 39–117)
Bilirubin, Direct: 0 mg/dL (ref 0.0–0.3)
Total Bilirubin: 0.3 mg/dL (ref 0.2–1.2)
Total Protein: 7.1 g/dL (ref 6.0–8.3)

## 2019-05-22 MED ORDER — NITROGLYCERIN 0.4 MG SL SUBL: SUBLINGUAL_TABLET | SUBLINGUAL | 1 refills | Status: DC

## 2019-05-22 MED ORDER — PROMETHAZINE-CODEINE 6.25-10 MG/5ML PO SOLN: 5.0000 mL | Freq: Four times a day (QID) | ORAL | 1 refills | Status: DC | PRN

## 2019-05-22 MED ORDER — ACETAMINOPHEN-CODEINE #3 300-30 MG PO TABS: 1.0000 | ORAL_TABLET | ORAL | 3 refills | Status: DC | PRN

## 2019-05-22 NOTE — Assessment & Plan Note (Signed)
No relapse Dilantin

## 2019-05-22 NOTE — Assessment & Plan Note (Signed)
Lipitor 

## 2019-05-22 NOTE — Progress Notes (Signed)
Subjective:  Patient ID: Veronica [name], female    DOB: [birth year]  Age: 69 y.o. MRN: [id number]  CC: No chief complaint on file.   HPI TIMYA QUARTARARO presents for chronic cough, seizure, LBP,  bronchiectases f/u  Outpatient Medications Prior to Visit  Medication Sig Dispense Refill  . acetaminophen-codeine (TYLENOL #3) 300-30 MG tablet Take 1 tablet by mouth every 4 (four) hours as needed. 60 tablet 3  . albuterol (PROVENTIL) (2.5 MG/3ML) 0.083% nebulizer solution USE 1 VIAL IN NEBULIZER 2 TIMES DAILY. Generic: VENTOLIN 60 vial 5  . albuterol (VENTOLIN HFA) 108 (90 Base) MCG/ACT inhaler INHALE 1 TO 2 PUFFS INTO THE LUNGS EVERY 4 HOURS AS NEEDED FOR WHEEZING OR SHORTNESS OF BREATH 8.5 g 3  . aspirin 81 MG chewable tablet Chew by mouth daily.    [name] atorvastatin (LIPITOR) 40 MG tablet TAKE 1 TABLET(40 MG) BY MOUTH DAILY 90 tablet 3  . budesonide (PULMICORT) 0.25 MG/2ML nebulizer solution USE 1 VIAL IN NEBULIZER 2 TIMES DAILY. Generic: PULMICORT 120 mL 2  . cariprazine (VRAYLAR) capsule Take 1 capsule (1.5 mg total) by mouth daily. 90 capsule 3  . cetirizine (ZYRTEC) 5 MG tablet Take 1 tablet (5 mg total) by mouth daily. 30 tablet 1  . Cholecalciferol (VITAMIN D3) 50 MCG (2000 UT) capsule Take 1 capsule (2,000 Units total) by mouth daily. 100 capsule 3  . Cholecalciferol 1000 units tablet Take 1,000 Units by mouth daily.    . clonazePAM (KLONOPIN) 1 MG tablet 2 po qhs and 1/2 tab in addition in daytime prn 225 tablet 3  . cyanocobalamin (,VITAMIN B-12,) 1000 MCG/ML injection Inject 1 mL (1,000 mcg total) into the muscle every 14 (fourteen) days. 10 mL 5  . Cyanocobalamin (VITAMIN B-12 PO) Take 5,000 mcg by mouth daily.    . cyclobenzaprine (FLEXERIL) 10 MG tablet Take 1 tablet (10 mg total) by mouth 3 (three) times daily as needed for muscle spasms. 90 tablet 0  . diphenoxylate-atropine (LOMOTIL) 2.5-0.025 MG tablet Take 1-2 tablets by mouth 4 (four) times daily as needed for diarrhea or  loose stools. 30 tablet 1  . Docusate Calcium (STOOL SOFTENER PO) Take 100 mg by mouth as needed (for constipation).     [name] escitalopram (LEXAPRO) 20 MG tablet TAKE 1 TABLET BY MOUTH DAILY 90 tablet 3  . formoterol (PERFOROMIST) 20 MCG/2ML nebulizer solution Take 2 mLs (20 mcg total) by nebulization 2 (two) times daily. 120 mL 3  . furosemide (LASIX) 20 MG tablet TAKE 1 TABLET(20 MG) BY MOUTH DAILY AS NEEDED 90 tablet 3  . ketoconazole (NIZORAL) 2 % cream Apply 1 application topically 2 (two) times daily. 45 g 1  . levothyroxine (SYNTHROID) 88 MCG tablet TAKE 1 TABLET(88 MCG) BY MOUTH DAILY 90 tablet 3  . magic mouthwash SOLN Take 5 mLs by mouth 4 (four) times daily. Swish, hold and spit out---Do  Not swallow.  100 ml of dexamethasone 0.5 mg per 5 ml elixir 60 ml nystatin 100,000 Unit 100 ml of diphenhydramine 12.5 mg per 5 ml elixir 260 mL 3  . nitroGLYCERIN (NITROSTAT) 0.4 MG SL tablet DISSOLVE 1 TABLET UNDER THE TONGUE EVERY 5 MINUTES AS NEEDED FOR CHEST PAIN 25 tablet 3  . ondansetron (ZOFRAN) 4 MG tablet Take 1 tablet (4 mg total) by mouth every 8 (eight) hours as needed for nausea or vomiting. 21 tablet 0  . phenytoin (DILANTIN) 100 MG ER capsule 200 mg every morning and 100 mg every evening 270 capsule  3  . promethazine (PHENERGAN) 25 MG tablet Take 0.5 tablets (12.5 mg total) by mouth every 6 (six) hours as needed. 60 tablet 1  . Promethazine-Codeine 6.25-10 MG/5ML SOLN TAKE 5 ML BY MOUTH EVERY 6 HOURS AS NEEDED FOR COUGH 300 mL 0  . Respiratory Therapy Supplies (FLUTTER) DEVI Use after nebulization treatments 1 each 0  . sodium chloride (MURO 128) 5 % ophthalmic solution Place 1 drop into both eyes as needed for irritation.     . sodium chloride HYPERTONIC 3 % nebulizer solution USE 1 VIAL VIA NEBULIZER TWICE DAILY 240 mL 2  . SYRINGE-NEEDLE, DISP, 3 ML (BD ECLIPSE SYRINGE) 25G X 1" 3 ML MISC Use sq q 2 wks 50 each 3  . traZODone (DESYREL) 50 MG tablet TAKE 4 TABLETS(200 MG) BY MOUTH AT  BEDTIME 360 tablet 1  . verapamil (CALAN-SR) 240 MG CR tablet TAKE 1 TABLET(240 MG) BY MOUTH AT BEDTIME 90 tablet 3   No facility-administered medications prior to visit.    ROS: Review of Systems  Constitutional: Positive for fatigue. Negative for activity change, appetite change, chills and unexpected weight change.  HENT: Negative for congestion, mouth sores and sinus pressure.   Eyes: Negative for visual disturbance.  Respiratory: Positive for cough, shortness of breath and wheezing. Negative for chest tightness.   Gastrointestinal: Negative for abdominal pain and nausea.  Genitourinary: Negative for difficulty urinating, frequency and vaginal pain.  Musculoskeletal: Positive for arthralgias, back pain and gait problem.  Skin: Negative for pallor and rash.  Neurological: Positive for weakness. Negative for dizziness, tremors, numbness and headaches.  Psychiatric/Behavioral: Negative for confusion, sleep disturbance and suicidal ideas. The patient is nervous/anxious.     Objective:  BP 116/62 (BP Location: Left Arm, Patient Position: Sitting, Cuff Size: Normal)   Pulse 72   Temp 99 F (37.2 C) (Oral)   Ht 5' (1.524 m)   Wt 153 lb (69.4 kg)   SpO2 96%   BMI 29.88 kg/m   BP Readings from Last 3 Encounters:  05/22/19 116/62  03/23/19 130/64  02/27/19 124/82    Wt Readings from Last 3 Encounters:  05/22/19 153 lb (69.4 kg)  02/28/19 150 lb (68 kg)  02/27/19 151 lb (68.5 kg)    Physical Exam Constitutional:      General: She is not in acute distress.    Appearance: Normal appearance. She is well-developed.  HENT:     Head: Normocephalic.     Right Ear: External ear normal.     Left Ear: External ear normal.     Nose: Nose normal.  Eyes:     General:        Right eye: No discharge.        Left eye: No discharge.     Conjunctiva/sclera: Conjunctivae normal.     Pupils: Pupils are equal, round, and reactive to light.  Neck:     Thyroid: No thyromegaly.      Vascular: No JVD.     Trachea: No tracheal deviation.  Cardiovascular:     Rate and Rhythm: Normal rate and regular rhythm.     Heart sounds: Normal heart sounds.  Pulmonary:     Effort: No respiratory distress.     Breath sounds: No stridor. Rhonchi present. No wheezing.  Abdominal:     General: Bowel sounds are normal. There is no distension.     Palpations: Abdomen is soft. There is no mass.     Tenderness: There is no abdominal tenderness. There  is no guarding or rebound.  Musculoskeletal:        General: Tenderness present.     Cervical back: Normal range of motion and neck supple.  Lymphadenopathy:     Cervical: No cervical adenopathy.  Skin:    Findings: No erythema or rash.  Neurological:     Mental Status: She is oriented to person, place, and time.     Cranial Nerves: No cranial nerve deficit.     Motor: Weakness present. No abnormal muscle tone.     Coordination: Coordination abnormal.     Gait: Gait abnormal.     Deep Tendon Reflexes: Reflexes normal.  Psychiatric:        Behavior: Behavior normal.        Thought Content: Thought content normal.        Judgment: Judgment normal.    [place]  Lab Results  Component Value Date   WBC 8.4 02/14/2019   HGB 12.8 02/14/2019   HCT 38.4 02/14/2019   PLT 337.0 02/14/2019   GLUCOSE 94 02/14/2019   CHOL 249 (H) 11/10/2018   TRIG 88.0 11/10/2018   HDL 57.70 11/10/2018   LDLDIRECT 165.0 09/26/2009   LDLCALC 174 (H) 11/10/2018   ALT 8 11/10/2018   AST 12 11/10/2018   NA 141 02/14/2019   K 4.0 02/14/2019   CL 102 02/14/2019   CREATININE 0.69 02/14/2019   BUN 10 02/14/2019   CO2 31 02/14/2019   TSH 2.22 11/10/2018   INR 1.0 ratio 11/07/2009   HGBA1C 5.7 02/14/2019    MR 3D Recon At Scanner  Result Date: 02/12/2019 CLINICAL DATA:  Right upper quadrant abdominal pain. Suspected cholecystitis. EXAM: MRI ABDOMEN WITHOUT AND WITH CONTRAST (INCLUDING MRCP) TECHNIQUE: Multiplanar multisequence MR imaging of the abdomen  was performed both before and after the administration of intravenous contrast. Heavily T2-weighted images of the biliary and pancreatic ducts were obtained, and three-dimensional MRCP images were rendered by post processing. CONTRAST:  51mL GADAVIST GADOBUTROL 1 MMOL/ML IV SOLN COMPARISON:  Abdominal ultrasound 08/07/2015. Abdominal CT 08/31/2010. FINDINGS: Despite efforts by the technologist and patient, mild motion artifact is present on today's exam and could not be eliminated. This reduces exam sensitivity and specificity. Lower chest: There are no significant findings within the visualized lower chest. Hepatobiliary: Small hepatic cysts are noted. There are new suspicious hepatic findings. The gallbladder is surgically absent. There is mild fusiform extrahepatic biliary dilatation with the common hepatic duct measuring up to 8 mm in diameter. The duct tapers distally and appears similar to previous CT. No evidence of choledocholithiasis or intrahepatic biliary dilatation. Pancreas: Unremarkable. No pancreatic ductal dilatation or surrounding inflammatory changes. Stable prominent fat between the pancreatic head and the superior mesenteric vein. Spleen: Normal in size without focal abnormality. Adrenals/Urinary Tract: Both adrenal glands appear normal. Small right renal cyst. No evidence of renal mass or hydronephrosis. Stomach/Bowel: No evidence of bowel wall thickening, distention or surrounding inflammatory change. Vascular/Lymphatic: There are no enlarged abdominal lymph nodes. Aortic and branch vessel atherosclerosis. No acute vascular findings identified. Other: The visualized anterior abdominal wall appears intact. No ascites. Musculoskeletal: No acute or significant osseous findings. Facet hypertrophy and associated synovial enhancement noted within the mid lumbar spine. IMPRESSION: 1. No acute findings or explanation for the patient's symptoms. 2. Mild fusiform extrahepatic biliary dilatation status  post cholecystectomy, similar to previous CT. No evidence of choledocholithiasis or intrahepatic biliary dilatation. 3.  Aortic Atherosclerosis (ICD10-I70.0). Electronically Signed   By: Richardean Sale M.D.   On:  02/12/2019 13:07   MR ABD/MRCP  Result Date: 02/12/2019 CLINICAL DATA:  Right upper quadrant abdominal pain. Suspected cholecystitis. EXAM: MRI ABDOMEN WITHOUT AND WITH CONTRAST (INCLUDING MRCP) TECHNIQUE: Multiplanar multisequence MR imaging of the abdomen was performed both before and after the administration of intravenous contrast. Heavily T2-weighted images of the biliary and pancreatic ducts were obtained, and three-dimensional MRCP images were rendered by post processing. CONTRAST:  54mL GADAVIST GADOBUTROL 1 MMOL/ML IV SOLN COMPARISON:  Abdominal ultrasound 08/07/2015. Abdominal CT 08/31/2010. FINDINGS: Despite efforts by the technologist and patient, mild motion artifact is present on today's exam and could not be eliminated. This reduces exam sensitivity and specificity. Lower chest: There are no significant findings within the visualized lower chest. Hepatobiliary: Small hepatic cysts are noted. There are new suspicious hepatic findings. The gallbladder is surgically absent. There is mild fusiform extrahepatic biliary dilatation with the common hepatic duct measuring up to 8 mm in diameter. The duct tapers distally and appears similar to previous CT. No evidence of choledocholithiasis or intrahepatic biliary dilatation. Pancreas: Unremarkable. No pancreatic ductal dilatation or surrounding inflammatory changes. Stable prominent fat between the pancreatic head and the superior mesenteric vein. Spleen: Normal in size without focal abnormality. Adrenals/Urinary Tract: Both adrenal glands appear normal. Small right renal cyst. No evidence of renal mass or hydronephrosis. Stomach/Bowel: No evidence of bowel wall thickening, distention or surrounding inflammatory change. Vascular/Lymphatic: There  are no enlarged abdominal lymph nodes. Aortic and branch vessel atherosclerosis. No acute vascular findings identified. Other: The visualized anterior abdominal wall appears intact. No ascites. Musculoskeletal: No acute or significant osseous findings. Facet hypertrophy and associated synovial enhancement noted within the mid lumbar spine. IMPRESSION: 1. No acute findings or explanation for the patient's symptoms. 2. Mild fusiform extrahepatic biliary dilatation status post cholecystectomy, similar to previous CT. No evidence of choledocholithiasis or intrahepatic biliary dilatation. 3.  Aortic Atherosclerosis (ICD10-I70.0). Electronically Signed   By: Richardean Sale M.D.   On: 02/12/2019 13:07    Assessment & Plan:   There are no diagnoses linked to this encounter.   No orders of the defined types were placed in this encounter.    Follow-up: No follow-ups on file.  Walker Kehr, MD

## 2019-05-22 NOTE — Assessment & Plan Note (Signed)
Atorvastatin po

## 2019-05-22 NOTE — Assessment & Plan Note (Signed)
Clonazepam prn 

## 2019-05-22 NOTE — Assessment & Plan Note (Signed)
Prom - cod syr  Potential benefits of a long term opioids use as well as potential risks (i.e. addiction risk, apnea etc) and complications (i.e. Somnolence, constipation and others) were explained to the patient and were aknowledged.

## 2019-05-28 ENCOUNTER — Ambulatory Visit (INDEPENDENT_AMBULATORY_CARE_PROVIDER_SITE_OTHER): Payer: Medicare Other | Admitting: Psychology

## 2019-05-28 DIAGNOSIS — F331 Major depressive disorder, recurrent, moderate: Secondary | ICD-10-CM | POA: Diagnosis not present

## 2019-06-07 ENCOUNTER — Other Ambulatory Visit: Payer: Self-pay | Admitting: *Deleted

## 2019-06-07 NOTE — Patient Outreach (Signed)
[facility]) Care Management  06/07/2019  Veronica [name] [birth year] [id number]  Unsuccessful outreach attempt made to patient. RN Health Coach left HIPAA compliant voicemail message along with her contact information.  Plan: RN Health Coach will call patient within the month of April  [place], [place] [phone] [email or web address]

## 2019-06-08 ENCOUNTER — Other Ambulatory Visit: Payer: Self-pay | Admitting: Internal Medicine

## 2019-06-11 ENCOUNTER — Ambulatory Visit (INDEPENDENT_AMBULATORY_CARE_PROVIDER_SITE_OTHER): Payer: Medicare Other | Admitting: Psychology

## 2019-06-11 DIAGNOSIS — F331 Major depressive disorder, recurrent, moderate: Secondary | ICD-10-CM

## 2019-06-12 ENCOUNTER — Other Ambulatory Visit: Payer: Self-pay

## 2019-06-12 ENCOUNTER — Encounter: Payer: Self-pay | Admitting: Primary Care

## 2019-06-12 ENCOUNTER — Ambulatory Visit (INDEPENDENT_AMBULATORY_CARE_PROVIDER_SITE_OTHER): Payer: Medicare Other | Admitting: Primary Care

## 2019-06-12 DIAGNOSIS — J3089 Other allergic rhinitis: Secondary | ICD-10-CM | POA: Diagnosis not present

## 2019-06-12 DIAGNOSIS — J479 Bronchiectasis, uncomplicated: Secondary | ICD-10-CM | POA: Diagnosis not present

## 2019-06-12 DIAGNOSIS — J439 Emphysema, unspecified: Secondary | ICD-10-CM | POA: Diagnosis not present

## 2019-06-12 DIAGNOSIS — J4531 Mild persistent asthma with (acute) exacerbation: Secondary | ICD-10-CM

## 2019-06-12 MED ORDER — FORMOTEROL FUMARATE 20 MCG/2ML IN NEBU: 20.0000 ug | INHALATION_SOLUTION | Freq: Two times a day (BID) | RESPIRATORY_TRACT | 3 refills | Status: DC

## 2019-06-12 MED ORDER — BUDESONIDE 0.25 MG/2ML IN SUSP: RESPIRATORY_TRACT | 2 refills | Status: DC

## 2019-06-12 NOTE — Patient Instructions (Addendum)
  Recommend: Continue Budesonide and Perforomist nebulizer twice daily Use flutter valve 2-3 times a day after nebulizer Try over the counter Flonase or Nasacort for allergies  Continue Zyrtec 5mg  daily   Orders: Please call [facility] on [address] to see if we can get patient time slot for her second covid vaccine   Follow-up: - Pharmacy consult for medication reconciliation  - Needs to establish with Dr. Carlis Abbott in 1-3 months (new patient visit, former Dr. Lake Bells)

## 2019-06-12 NOTE — Progress Notes (Signed)
@Patient  ID: Veronica [name], female    DOB: 01/06/1951, 69 y.o.   MRN: [id number]  Chief Complaint  Patient presents with  . Follow-up    f/u Bronchiectasis with lower respiratory infection. Breathing is at baseline .     Referring provider: Plotnikov, Evie Lacks, MD  HPI: 69 year old female, never smoked (passive smoke exposure). PMH asthma, pulmonary emphysema, chronic bronchitis, bronchiectasis. Patient of Dr. Lake Bells. Doing better since started on therapy vest twice daily. Continues to have chronic cough which is not likely to go away completely. Maintained on Pulmicort BID and albuterol  prn and hypertonic saline neb treatment with therapy vest twice daily. PRN flutter valve.   Previous [facility] pulmonary encounter: 01/08/2018 Patient presents today for 4 month office visit.  Complains of shortness of breath, wheezing and chest tightness for the last 4 weeks.  She is having difficulty bringing up mucus describes it as thick and string-like.  Thinks her symptoms could be related to allergies, she has not tried taking an antihistamine. She continues using Pulmicort twice daily, has required albuterol nebulizer up to 4 times a day for wheezing.  Also using hypertonic saline neb treatments with therapy vest twice daily, flutter valve and Mucinex.  Patient asking if it is okay to receive influenza vaccine, had shot last year with no adverse reaction.  Reports previous localized reaction to flu shot many years ago. Advised to start Zyrtec daily, mucinex twice daily. Received depo-medrol inj. CXR showed no evidence for acute cardiopulmonary abnormality. Ok to received influenza vaccine when better.   01/10/2018 Patient presents today for 1 week follow-up. She is feeling much better. Breathing is at her baseline. She gets short of breath when she's walking and has to stop. Some nasal congestion and chronic cough. Started zyrtec last week. Continues Pulmicort twice a day, still using albuterol nebulizer 4  times a day. States that she feels she needs her albuterol and also somewhat preventative. Denies chest pain, wheeze. Afebrile.   06/12/2019 Patient presents today for follow-up visit. States that her breathing is at her baseline. Her cough is occasionally productive. Mucus can vary from clear to yellow. Seasonal allergies have been bothering her. She is compliant with perforomist and pulmicort nebulizer twice a day. She used her albuterol inhaler twice today. She uses her flutter valve twice a day after her nebulizers. She is not using respitech vest which was originally prescribed by Dr. Ashok Cordia. States that when she uses the vest she has chest pain she needs to take nitroglycerin. She would like the machine to be picked up. Needs refill of medication.    Significant testing reviewed: HRCT CHEST W/O 06/28/16 images independently reviewed showing normal pulmonary parenchyma with some mucus plugging and bronchiectasis in the lingula and right middle lobe  07/09/16: FVC 1.78 L (58%) FEV1 1.48 L (64%) FEV1/FVC 0.83 FEF 25-75 1.71 L (85%) negative bronchodilator response TLC 4.08 L (81%) RV 104% ERV 35% DLCO corrected 66%   Allergies  Allergen Reactions  . Avelox [Moxifloxacin Hcl In Nacl] Anaphylaxis, Swelling and Rash  . Cephalexin Shortness Of Breath and Itching  . Moxifloxacin Anaphylaxis, Itching, Swelling and Rash    Avelox  . Amantadine Hcl Other (See Comments)    Unknown  . Bee Venom Itching and Swelling    Localized  . Clindamycin Hcl Other (See Comments)    severe allergic reaction  . Cymbalta [Duloxetine Hcl] Nausea Only    Dizzy  . Cyproheptadine Hcl Other (See Comments)  Unknown  . Doxycycline Hyclate Other (See Comments)    High blood pressure  . Effexor [Venlafaxine Hydrochloride] Other (See Comments)    elev BP (sky high), dizzy, shaking, ER visit  . Effexor [Venlafaxine]     Restless   . Fluticasone-Salmeterol Itching  . Guaifenesin Other (See Comments)    Unknown    . Imipramine Hcl Other (See Comments)    Unknown   . Lactose Intolerance (Gi) Other (See Comments)    Per allergy testing  . Latex Itching    dermatitis  . Other Other (See Comments)    SULFONAMIDES = [Rash]  . Oxycodone-Acetaminophen Itching  . Phenytoin Other (See Comments)    Pt must DILANTIN "brand name" only   . Pirbuterol Acetate Other (See Comments)    Maxair, Unknown  . Remeron [Mirtazapine]     nightmares  . Salmeterol Xinafoate Other (See Comments)    Shaking, Serevent  . Viibryd [Vilazodone Hcl] Itching and Nausea Only    shaky  . Zolpidem Tartrate Other (See Comments)    REACTION: not effective  . Azithromycin Itching and Rash  . Ciprofloxacin Itching and Rash  . Contrast Media  [Iodinated Diagnostic Agents] Rash    Other reaction(s): Respiratory Distress (ALLERGY/intolerance)  . Erythromycin Base Itching and Rash  . Flagyl [Metronidazole Hcl] Itching and Rash  . Fluconazole Itching and Rash  . Fluoxetine Rash  . Lansoprazole Itching and Rash  . Metoclopramide Hcl Itching and Rash  . Montelukast Sodium Itching and Rash  . Penicillins Itching and Rash    All cillins, Has patient had a PCN reaction causing immediate rash, facial/tongue/throat swelling, SOB or lightheadedness with hypotension: Yes Has patient had a PCN reaction causing severe rash involving mucus membranes or skin necrosis: No Has patient had a PCN reaction that required hospitalization No Has patient had a PCN reaction occurring within the last 10 years: Yes If all of the above answers are "NO", then may proceed with Cephalosporin use.   [name] Propulsid [Cisapride] Itching and Rash  . Reglan [Metoclopramide] Itching and Rash  . Sulfadiazine Itching and Rash  . Sulfamethoxazole-Trimethoprim Itching and Rash  . Telithromycin Itching and Rash  . Tetracycline Hcl Itching and Rash  . Topiramate Itching and Rash  . Valproic Acid Rash    Immunization History  Administered Date(s) Administered  .  Fluad Quad(high Dose 65+) 11/22/2018  . Influenza, High Dose Seasonal PF 01/10/2018  . Influenza,inj,Quad PF,6+ Mos 05/12/2017  . PFIZER SARS-COV-2 Vaccination 05/30/2019  . Pneumococcal Conjugate-13 11/13/2013  . Pneumococcal Polysaccharide-23 02/19/2004, 01/29/2009, 10/07/2015  . Td 02/11/2014    Past Medical History:  Diagnosis Date  . Adenomatous colon polyp   . Asthma   . AVM (arteriovenous malformation)   . Bronchitis, chronic ([place])   . CAD (coronary artery disease)   . Colon polyp 01/04/91   hyperplastic  . COPD (chronic obstructive pulmonary disease) ([place])   . CVA (cerebral infarction)    following brain surgery  . Depression   . Diverticulosis of colon 02/17/06  . Endometriosis   . Gastric ulcer   . GERD (gastroesophageal reflux disease)   . History of colonic polyps   . Hyperlipidemia   . Hypertension   . LBP (low back pain)   . Migraine headache   . OA (osteoarthritis)   . PONV (postoperative nausea and vomiting)    slight nausea  . Seizure disorder ([place])   . Seizures ([place])   . Shortness of breath dyspnea   . Sjogren's  disease ([place]) 2010   per Dr. Owens Shark, [place]  . Stroke [facility])    right side weakness  . Thyroid nodule   . Vitamin B 12 deficiency   . Vitamin D deficiency     Tobacco History: Social History   Tobacco Use  Smoking Status Never Smoker  Smokeless Tobacco Never Used  Tobacco Comment   Father & mutiple other family members smoked.   Counseling given: Not Answered Comment: Father & mutiple other family members smoked.   Outpatient Medications Prior to Visit  Medication Sig Dispense Refill  . acetaminophen-codeine (TYLENOL #3) 300-30 MG tablet Take 1 tablet by mouth every 4 (four) hours as needed. 60 tablet 3  . albuterol (PROVENTIL) (2.5 MG/3ML) 0.083% nebulizer solution USE 1 VIAL IN NEBULIZER 2 TIMES DAILY. Generic: VENTOLIN 60 vial 5  . albuterol (VENTOLIN HFA) 108 (90 Base) MCG/ACT inhaler INHALE 1 TO 2 PUFFS INTO THE LUNGS EVERY 4  HOURS AS NEEDED FOR WHEEZING OR SHORTNESS OF BREATH 8.5 g 3  . aspirin 81 MG chewable tablet Chew by mouth daily.    [name] atorvastatin (LIPITOR) 40 MG tablet TAKE 1 TABLET(40 MG) BY MOUTH DAILY 90 tablet 3  . cariprazine (VRAYLAR) capsule Take 1 capsule (1.5 mg total) by mouth daily. 90 capsule 3  . cetirizine (ZYRTEC) 5 MG tablet Take 1 tablet (5 mg total) by mouth daily. 30 tablet 1  . Cholecalciferol (VITAMIN D3) 50 MCG (2000 UT) capsule Take 1 capsule (2,000 Units total) by mouth daily. 100 capsule 3  . Cholecalciferol 1000 units tablet Take 1,000 Units by mouth daily.    . clonazePAM (KLONOPIN) 1 MG tablet 2 po qhs and 1/2 tab in addition in daytime prn 225 tablet 3  . cyanocobalamin (,VITAMIN B-12,) 1000 MCG/ML injection Inject 1 mL (1,000 mcg total) into the muscle every 14 (fourteen) days. 10 mL 5  . Cyanocobalamin (VITAMIN B-12 PO) Take 5,000 mcg by mouth daily.    . cyclobenzaprine (FLEXERIL) 10 MG tablet Take 1 tablet (10 mg total) by mouth 3 (three) times daily as needed for muscle spasms. 90 tablet 0  . diphenoxylate-atropine (LOMOTIL) 2.5-0.025 MG tablet Take 1-2 tablets by mouth 4 (four) times daily as needed for diarrhea or loose stools. 30 tablet 1  . Docusate Calcium (STOOL SOFTENER PO) Take 100 mg by mouth as needed (for constipation).     [name] escitalopram (LEXAPRO) 20 MG tablet TAKE 1 TABLET BY MOUTH DAILY 90 tablet 3  . furosemide (LASIX) 20 MG tablet TAKE 1 TABLET(20 MG) BY MOUTH DAILY AS NEEDED 90 tablet 3  . ketoconazole (NIZORAL) 2 % cream Apply 1 application topically 2 (two) times daily. 45 g 1  . levothyroxine (SYNTHROID) 88 MCG tablet TAKE 1 TABLET(88 MCG) BY MOUTH DAILY 90 tablet 3  . nitroGLYCERIN (NITROSTAT) 0.4 MG SL tablet DISSOLVE 1 TABLET UNDER THE TONGUE EVERY 5 MINUTES AS NEEDED FOR CHEST PAIN 25 tablet 1  . ondansetron (ZOFRAN) 4 MG tablet Take 1 tablet (4 mg total) by mouth every 8 (eight) hours as needed for nausea or vomiting. 21 tablet 0  . phenytoin  (DILANTIN) 100 MG ER capsule 200 mg every morning and 100 mg every evening 270 capsule 3  . promethazine (PHENERGAN) 25 MG tablet Take 0.5 tablets (12.5 mg total) by mouth every 6 (six) hours as needed. 60 tablet 1  . Promethazine-Codeine 6.25-10 MG/5ML SOLN Take 5 mLs by mouth every 6 (six) hours as needed. 300 mL 1  . Respiratory Therapy Supplies (FLUTTER)  DEVI Use after nebulization treatments 1 each 0  . sodium chloride (MURO 128) 5 % ophthalmic solution Place 1 drop into both eyes as needed for irritation.     . SYRINGE-NEEDLE, DISP, 3 ML (BD ECLIPSE SYRINGE) 25G X 1" 3 ML MISC Use sq q 2 wks 50 each 3  . traZODone (DESYREL) 50 MG tablet TAKE 4 TABLETS(200 MG) BY MOUTH AT BEDTIME 360 tablet 1  . verapamil (CALAN-SR) 240 MG CR tablet TAKE 1 TABLET(240 MG) BY MOUTH AT BEDTIME 90 tablet 3  . budesonide (PULMICORT) 0.25 MG/2ML nebulizer solution USE 1 VIAL IN NEBULIZER 2 TIMES DAILY. Generic: PULMICORT 120 mL 2  . formoterol (PERFOROMIST) 20 MCG/2ML nebulizer solution Take 2 mLs (20 mcg total) by nebulization 2 (two) times daily. 120 mL 3  . magic mouthwash SOLN Take 5 mLs by mouth 4 (four) times daily. Swish, hold and spit out---Do  Not swallow.  100 ml of dexamethasone 0.5 mg per 5 ml elixir 60 ml nystatin 100,000 Unit 100 ml of diphenhydramine 12.5 mg per 5 ml elixir 260 mL 3  . sodium chloride HYPERTONIC 3 % nebulizer solution USE 1 VIAL VIA NEBULIZER TWICE DAILY 240 mL 2   No facility-administered medications prior to visit.    Review of Systems  Review of Systems  Respiratory: Positive for cough. Negative for shortness of breath.     Physical Exam  BP 130/80 (BP Location: Left Arm, Patient Position: Sitting, Cuff Size: Normal)   Pulse 75   Temp 98.2 F (36.8 C) (Temporal)   Ht 5' (1.524 m)   Wt 155 lb 12.8 oz (70.7 kg)   SpO2 95% Comment: room air  BMI 30.43 kg/m  Physical Exam Constitutional:      General: She is not in acute distress.    Appearance: Normal appearance.  She is not ill-appearing.  HENT:     Mouth/Throat:     Mouth: Mucous membranes are moist.     Pharynx: Oropharynx is clear.  Cardiovascular:     Rate and Rhythm: Normal rate and regular rhythm.  Pulmonary:     Effort: Pulmonary effort is normal.     Breath sounds: Normal breath sounds.  Skin:    General: Skin is warm.  Neurological:     General: No focal deficit present.     Mental Status: She is alert. Mental status is at baseline.  Psychiatric:        Mood and Affect: Mood normal.        Thought Content: Thought content normal.        Judgment: Judgment normal.      Lab Results:  CBC    Component Value Date/Time   WBC 11.1 (H) 05/22/2019 1349   RBC 4.30 05/22/2019 1349   HGB 12.4 05/22/2019 1349   HCT 37.6 05/22/2019 1349   PLT 365.0 05/22/2019 1349   MCV 87.6 05/22/2019 1349   MCH 29.1 08/21/2015 1450   MCHC 32.9 05/22/2019 1349   RDW 14.6 05/22/2019 1349   LYMPHSABS 3.6 05/22/2019 1349   MONOABS 1.7 (H) 05/22/2019 1349   EOSABS 0.2 05/22/2019 1349   BASOSABS 0.1 05/22/2019 1349    BMET    Component Value Date/Time   NA 140 05/22/2019 1349   K 4.2 05/22/2019 1349   CL 101 05/22/2019 1349   CO2 34 (H) 05/22/2019 1349   GLUCOSE 70 05/22/2019 1349   BUN 7 05/22/2019 1349   CREATININE 0.64 05/22/2019 1349   CALCIUM 8.6 05/22/2019 1349  GFRNONAA >60 08/31/2015 0254   GFRAA >60 08/31/2015 0254    BNP No results found for: BNP  ProBNP    Component Value Date/Time   PROBNP 78.0 08/01/2015 1234    Imaging: No results found.   Assessment & Plan:   Bronchiectasis ([place]) - Chronic productive cough  - Continue flutter valve 3 times a day - Hold off on returning therapy vest until seen by Dr. Carlis Abbott   Asthma - Stable interval - Continue Budesonide and Perforomist nebulizer twice daily - Refer to pharmacy for medication reconciliation  Seasonal rhinitis - Recommend over the counter Flonase or Nasacort  - Continue Zyrtec 5mg  daily   Martyn Ehrich, NP 06/15/2019

## 2019-06-15 DIAGNOSIS — J302 Other seasonal allergic rhinitis: Secondary | ICD-10-CM | POA: Insufficient documentation

## 2019-06-15 NOTE — Assessment & Plan Note (Deleted)
-   Stable interval - Continue Budesonide and Perforomist nebulizer twice daily - Refer to pharmacy

## 2019-06-15 NOTE — Assessment & Plan Note (Signed)
-   Stable interval - Continue Budesonide and Perforomist nebulizer twice daily - Refer to pharmacy for medication reconciliation

## 2019-06-15 NOTE — Assessment & Plan Note (Signed)
-   Chronic productive cough  - Continue flutter valve 3 times a day - Hold off on returning therapy vest until seen by Dr. Carlis Abbott

## 2019-06-15 NOTE — Assessment & Plan Note (Signed)
-   Recommend over the counter Flonase or Nasacort  - Continue Zyrtec 5mg  daily

## 2019-06-21 ENCOUNTER — Other Ambulatory Visit: Payer: Self-pay | Admitting: *Deleted

## 2019-06-21 NOTE — Patient Outreach (Signed)
[facility]) Care Management  06/21/2019   Veronica [name] June 14, 1950 [id number]  Subjective: Successful telephone outreach call to patient. HIPAA identifiers obtained. Patient states that she is doing well. She explains that she has had some difficulty due to high pollen season and will try to avoid going outside during very high allergen days. Patient's cough has increased some and she at times gets more short of breath than usual reporting that 2 weeks ago she used her rescue inhaler 2-3 times daily for about a 3 day period. She is feeling more at baseline currently. She continues to take her controlled inhalers as prescribed and is using the nebulizer twice daily routinely stating this works for her. Nurse encouraged patient to use her flutter device after she completes her nebulizer to enable more mucous to come up and patient verbalized understanding. Patient saw NP Volanda Napoleon from pulmonology 06/12/19. The provider did not change her medications and explained that the patient's new pulmonologist would be Dr. Sherryll Burger who specializes in bronchiectasis. Patient is trying to get an appointment with Dr. Sherryll Burger to establish care and states she would like some education about bronchiectasis. She will call [facility] 06/27/19 to schedule her second covid-19 vaccination. Patient denies any recent falls, she currently feels her COPD is controlled at baseline for her, and she does not have any other concerns at this time.  Current Medications:  Current Outpatient Medications  Medication Sig Dispense Refill  . acetaminophen-codeine (TYLENOL #3) 300-30 MG tablet Take 1 tablet by mouth every 4 (four) hours as needed. 60 tablet 3  . albuterol (PROVENTIL) (2.5 MG/3ML) 0.083% nebulizer solution USE 1 VIAL IN NEBULIZER 2 TIMES DAILY. Generic: VENTOLIN 60 vial 5  . albuterol (VENTOLIN HFA) 108 (90 Base) MCG/ACT inhaler INHALE 1 TO 2 PUFFS INTO THE LUNGS EVERY 4 HOURS AS NEEDED FOR WHEEZING OR SHORTNESS OF  BREATH 8.5 g 3  . aspirin 81 MG chewable tablet Chew by mouth daily.    [name] atorvastatin (LIPITOR) 40 MG tablet TAKE 1 TABLET(40 MG) BY MOUTH DAILY 90 tablet 3  . budesonide (PULMICORT) 0.25 MG/2ML nebulizer solution USE 1 VIAL IN NEBULIZER 2 TIMES DAILY. Generic: PULMICORT 120 mL 2  . cariprazine (VRAYLAR) capsule Take 1 capsule (1.5 mg total) by mouth daily. 90 capsule 3  . cetirizine (ZYRTEC) 5 MG tablet Take 1 tablet (5 mg total) by mouth daily. 30 tablet 1  . Cholecalciferol (VITAMIN D3) 50 MCG (2000 UT) capsule Take 1 capsule (2,000 Units total) by mouth daily. 100 capsule 3  . Cholecalciferol 1000 units tablet Take 1,000 Units by mouth daily.    . clonazePAM (KLONOPIN) 1 MG tablet 2 po qhs and 1/2 tab in addition in daytime prn 225 tablet 3  . cyanocobalamin (,VITAMIN B-12,) 1000 MCG/ML injection Inject 1 mL (1,000 mcg total) into the muscle every 14 (fourteen) days. 10 mL 5  . Cyanocobalamin (VITAMIN B-12 PO) Take 5,000 mcg by mouth daily.    . cyclobenzaprine (FLEXERIL) 10 MG tablet Take 1 tablet (10 mg total) by mouth 3 (three) times daily as needed for muscle spasms. 90 tablet 0  . diphenoxylate-atropine (LOMOTIL) 2.5-0.025 MG tablet Take 1-2 tablets by mouth 4 (four) times daily as needed for diarrhea or loose stools. 30 tablet 1  . Docusate Calcium (STOOL SOFTENER PO) Take 100 mg by mouth as needed (for constipation).     [name] escitalopram (LEXAPRO) 20 MG tablet TAKE 1 TABLET BY MOUTH DAILY 90 tablet 3  . formoterol (PERFOROMIST) 20 MCG/2ML  nebulizer solution Take 2 mLs (20 mcg total) by nebulization 2 (two) times daily. 120 mL 3  . furosemide (LASIX) 20 MG tablet TAKE 1 TABLET(20 MG) BY MOUTH DAILY AS NEEDED 90 tablet 3  . ketoconazole (NIZORAL) 2 % cream Apply 1 application topically 2 (two) times daily. 45 g 1  . levothyroxine (SYNTHROID) 88 MCG tablet TAKE 1 TABLET(88 MCG) BY MOUTH DAILY 90 tablet 3  . nitroGLYCERIN (NITROSTAT) 0.4 MG SL tablet DISSOLVE 1 TABLET UNDER THE TONGUE EVERY  5 MINUTES AS NEEDED FOR CHEST PAIN 25 tablet 1  . ondansetron (ZOFRAN) 4 MG tablet Take 1 tablet (4 mg total) by mouth every 8 (eight) hours as needed for nausea or vomiting. 21 tablet 0  . phenytoin (DILANTIN) 100 MG ER capsule 200 mg every morning and 100 mg every evening 270 capsule 3  . promethazine (PHENERGAN) 25 MG tablet Take 0.5 tablets (12.5 mg total) by mouth every 6 (six) hours as needed. 60 tablet 1  . Promethazine-Codeine 6.25-10 MG/5ML SOLN Take 5 mLs by mouth every 6 (six) hours as needed. 300 mL 1  . Respiratory Therapy Supplies (FLUTTER) DEVI Use after nebulization treatments 1 each 0  . sodium chloride (MURO 128) 5 % ophthalmic solution Place 1 drop into both eyes as needed for irritation.     . SYRINGE-NEEDLE, DISP, 3 ML (BD ECLIPSE SYRINGE) 25G X 1" 3 ML MISC Use sq q 2 wks 50 each 3  . traZODone (DESYREL) 50 MG tablet TAKE 4 TABLETS(200 MG) BY MOUTH AT BEDTIME 360 tablet 1  . verapamil (CALAN-SR) 240 MG CR tablet TAKE 1 TABLET(240 MG) BY MOUTH AT BEDTIME 90 tablet 3   No current facility-administered medications for this visit.    Functional Status:  In your present state of health, do you have any difficulty performing the following activities: 05/08/2019 02/27/2019  Hearing? N N  Vision? N N  Difficulty concentrating or making decisions? N N  Walking or climbing stairs? N N  Dressing or bathing? N N  Doing errands, shopping? N N  Preparing Food and eating ? N N  Using the Toilet? N N  In the past six months, have you accidently leaked urine? N N  Do you have problems with loss of bowel control? N N  Managing your Medications? N N  Managing your Finances? N N  Housekeeping or managing your Housekeeping? N N  Some recent data might be hidden    Fall/Depression Screening: Fall Risk  06/21/2019 05/08/2019 02/27/2019  Falls in the past year? 1 1 0  Comment - - -  Number falls in past yr: 0 0 0  Comment - - -  Injury with Fall? 0 0 0  Risk for fall due to : -  Impaired balance/gait -  Follow up Falls prevention discussed;Education provided;Falls evaluation completed Falls prevention discussed;Education provided;Falls evaluation completed Falls prevention discussed   PHQ 2/9 Scores 05/08/2019 02/27/2019 02/21/2018 03/04/2017 02/18/2017 02/11/2017 10/07/2015  PHQ - 2 Score 4 4 4 1 6 6  0  PHQ- 9 Score 7 8 9  - 13 9 -    [facility] CM Care Plan Problem One     Most Recent Value  Care Plan Problem One  Impaierd gas exchange related to altered oxygen supply as evidenced by shortness or breath and reduced activity tolerance  Role Documenting the Problem One  [place] for Problem One  Active  [facility] Long Term Goal   patient will not have an admission  for COPD exacerbation within the next 90 days  [facility] Long Term Goal Start Date  06/21/19  Interventions for Problem One Long Term Goal  Encouraged patient to continue to monitor her COPD Zones and implement her exacerbation plans, encouraged medication adherence, discussed using her flutter device after her nebs to help cough up more mucous, discussed advoidenced of high pollen days, Nurse will send education about Bronchiectasis  [facility] CM Short Term Goal #1 Met Date  06/21/19      Plan:  RN Health Coach will send education about bronchiectasis, will call patient within the month of June, and patient agrees to future outreach calls.   Emelia Loron RN, BSN [place] [phone] [name]

## 2019-06-25 ENCOUNTER — Ambulatory Visit (INDEPENDENT_AMBULATORY_CARE_PROVIDER_SITE_OTHER): Payer: Medicare Other | Admitting: Psychology

## 2019-06-25 DIAGNOSIS — F331 Major depressive disorder, recurrent, moderate: Secondary | ICD-10-CM | POA: Diagnosis not present

## 2019-07-03 ENCOUNTER — Ambulatory Visit: Payer: Medicare Other | Admitting: Critical Care Medicine

## 2019-07-04 ENCOUNTER — Telehealth: Payer: Self-pay | Admitting: Internal Medicine

## 2019-07-04 NOTE — Telephone Encounter (Signed)
New message:   1.Medication Requested:cariprazine (VRAYLAR) capsule  2. Pharmacy (Name, [facility]): Patient Assistant Medications  3. On Med List: yes  4. Last Visit with PCP:   5. Next visit date with PCP:   Agent: Please be advised that RX refills may take up to 3 business days. We ask that you follow-up with your pharmacy.

## 2019-07-06 NOTE — Progress Notes (Signed)
HPI Patient presents today to [facility] to see pharmacy team for medication reconciliation.  Past medical history includes asthma,COPD, pulmonary emphysema, bronchiectasis, seasonal rhinitis, CAD, HTN, and GERD.   Number of hospitalizations in past year: 0 Number of COPD/asthma exacerbations in past year: 0  Respiratory Medications Current: Pulmicort, Perforomist, and albuterol nebs Patient reports no known adherence challenges  OBJECTIVE Allergies  Allergen Reactions  . Avelox [Moxifloxacin Hcl In Nacl] Anaphylaxis, Swelling and Rash  . Cephalexin Shortness Of Breath and Itching  . Moxifloxacin Anaphylaxis, Itching, Swelling and Rash    Avelox  . Amantadine Hcl Other (See Comments)    Unknown  . Bee Venom Itching and Swelling    Localized  . Clindamycin Hcl Other (See Comments)    severe allergic reaction  . Cymbalta [Duloxetine Hcl] Nausea Only    Dizzy  . Cyproheptadine Hcl Other (See Comments)    Unknown  . Doxycycline Hyclate Other (See Comments)    High blood pressure  . Effexor [Venlafaxine Hydrochloride] Other (See Comments)    elev BP (sky high), dizzy, shaking, ER visit  . Effexor [Venlafaxine]     Restless   . Fluticasone-Salmeterol Itching  . Guaifenesin Other (See Comments)    Unknown  . Imipramine Hcl Other (See Comments)    Unknown   . Lactose Intolerance (Gi) Other (See Comments)    Per allergy testing  . Latex Itching    dermatitis  . Other Other (See Comments)    SULFONAMIDES = [Rash]  . Oxycodone-Acetaminophen Itching  . Phenytoin Other (See Comments)    Pt must DILANTIN "brand name" only   . Pirbuterol Acetate Other (See Comments)    Maxair, Unknown  . Remeron [Mirtazapine]     nightmares  . Salmeterol Xinafoate Other (See Comments)    Shaking, Serevent  . Viibryd [Vilazodone Hcl] Itching and Nausea Only    shaky  . Zolpidem Tartrate Other (See Comments)    REACTION: not effective  . Azithromycin Itching and Rash  .  Ciprofloxacin Itching and Rash  . Contrast Media  [Iodinated Diagnostic Agents] Rash    Other reaction(s): Respiratory Distress (ALLERGY/intolerance)  . Erythromycin Base Itching and Rash  . Flagyl [Metronidazole Hcl] Itching and Rash  . Fluconazole Itching and Rash  . Fluoxetine Rash  . Lansoprazole Itching and Rash  . Metoclopramide Hcl Itching and Rash  . Montelukast Sodium Itching and Rash  . Penicillins Itching and Rash    All cillins, Has patient had a PCN reaction causing immediate rash, facial/tongue/throat swelling, SOB or lightheadedness with hypotension: Yes Has patient had a PCN reaction causing severe rash involving mucus membranes or skin necrosis: No Has patient had a PCN reaction that required hospitalization No Has patient had a PCN reaction occurring within the last 10 years: Yes If all of the above answers are "NO", then may proceed with Cephalosporin use.   [name] Propulsid [Cisapride] Itching and Rash  . Reglan [Metoclopramide] Itching and Rash  . Sulfadiazine Itching and Rash  . Sulfamethoxazole-Trimethoprim Itching and Rash  . Telithromycin Itching and Rash  . Tetracycline Hcl Itching and Rash  . Topiramate Itching and Rash  . Valproic Acid Rash    Outpatient Encounter Medications as of 07/10/2019  Medication Sig Note  . acetaminophen-codeine (TYLENOL #3) 300-30 MG tablet Take 1 tablet by mouth every 4 (four) hours as needed.   [name] albuterol (PROVENTIL) (2.5 MG/3ML) 0.083% nebulizer solution USE 1 VIAL IN NEBULIZER 2 TIMES DAILY. Generic: VENTOLIN   . albuterol (  VENTOLIN HFA) 108 (90 Base) MCG/ACT inhaler INHALE 1 TO 2 PUFFS INTO THE LUNGS EVERY 4 HOURS AS NEEDED FOR WHEEZING OR SHORTNESS OF BREATH   . aspirin 81 MG chewable tablet Chew by mouth daily.   [name] atorvastatin (LIPITOR) 40 MG tablet TAKE 1 TABLET(40 MG) BY MOUTH DAILY   . budesonide (PULMICORT) 0.25 MG/2ML nebulizer solution USE 1 VIAL IN NEBULIZER 2 TIMES DAILY. Generic: PULMICORT   . cariprazine (VRAYLAR)  capsule Take 1 capsule (1.5 mg total) by mouth daily.   . cetirizine (ZYRTEC) 5 MG tablet Take 1 tablet (5 mg total) by mouth daily. 03/23/2019: PRN  . Cholecalciferol (VITAMIN D3) 50 MCG (2000 UT) capsule Take 1 capsule (2,000 Units total) by mouth daily.   . Cholecalciferol 1000 units tablet Take 1,000 Units by mouth daily.   . clonazePAM (KLONOPIN) 1 MG tablet 2 po qhs and 1/2 tab in addition in daytime prn   . cyanocobalamin (,VITAMIN B-12,) 1000 MCG/ML injection Inject 1 mL (1,000 mcg total) into the muscle every 14 (fourteen) days.   . Cyanocobalamin (VITAMIN B-12 PO) Take 5,000 mcg by mouth daily.   . cyclobenzaprine (FLEXERIL) 10 MG tablet Take 1 tablet (10 mg total) by mouth 3 (three) times daily as needed for muscle spasms.   . diphenoxylate-atropine (LOMOTIL) 2.5-0.025 MG tablet Take 1-2 tablets by mouth 4 (four) times daily as needed for diarrhea or loose stools.   Mariane Baumgarten Calcium (STOOL SOFTENER PO) Take 100 mg by mouth as needed (for constipation).    [name] escitalopram (LEXAPRO) 20 MG tablet TAKE 1 TABLET BY MOUTH DAILY   . formoterol (PERFOROMIST) 20 MCG/2ML nebulizer solution Take 2 mLs (20 mcg total) by nebulization 2 (two) times daily.   . furosemide (LASIX) 20 MG tablet TAKE 1 TABLET(20 MG) BY MOUTH DAILY AS NEEDED   . levothyroxine (SYNTHROID) 88 MCG tablet TAKE 1 TABLET(88 MCG) BY MOUTH DAILY   . nitroGLYCERIN (NITROSTAT) 0.4 MG SL tablet DISSOLVE 1 TABLET UNDER THE TONGUE EVERY 5 MINUTES AS NEEDED FOR CHEST PAIN   . ondansetron (ZOFRAN) 4 MG tablet Take 1 tablet (4 mg total) by mouth every 8 (eight) hours as needed for nausea or vomiting.   . phenytoin (DILANTIN) 100 MG ER capsule 200 mg every morning and 100 mg every evening   . promethazine (PHENERGAN) 25 MG tablet Take 0.5 tablets (12.5 mg total) by mouth every 6 (six) hours as needed.   . Promethazine-Codeine 6.25-10 MG/5ML SOLN Take 5 mLs by mouth every 6 (six) hours as needed.   [name] Respiratory Therapy Supplies (FLUTTER)  DEVI Use after nebulization treatments   . sodium chloride (MURO 128) 5 % ophthalmic solution Place 1 drop into both eyes as needed for irritation.    . SYRINGE-NEEDLE, DISP, 3 ML (BD ECLIPSE SYRINGE) 25G X 1" 3 ML MISC Use sq q 2 wks   . traZODone (DESYREL) 50 MG tablet TAKE 4 TABLETS(200 MG) BY MOUTH AT BEDTIME   . verapamil (CALAN-SR) 240 MG CR tablet TAKE 1 TABLET(240 MG) BY MOUTH AT BEDTIME    No facility-administered encounter medications on file as of 07/10/2019.     Immunization History  Administered Date(s) Administered  . Fluad Quad(high Dose 65+) 11/22/2018  . Influenza, High Dose Seasonal PF 01/10/2018  . Influenza,inj,Quad PF,6+ Mos 05/12/2017  . PFIZER SARS-COV-2 Vaccination 05/30/2019  . Pneumococcal Conjugate-13 11/13/2013  . Pneumococcal Polysaccharide-23 02/19/2004, 01/29/2009, 10/07/2015  . Td 02/11/2014     PFTs PFT Results Latest Ref Rng & Units  07/09/2016  FVC-Pre L 1.78  FVC-Predicted Pre % 58  FVC-Post L 1.90  FVC-Predicted Post % 62  Pre FEV1/FVC % % 83  Post FEV1/FCV % % 86  FEV1-Pre L 1.48  FEV1-Predicted Pre % 64  FEV1-Post L 1.64  DLCO UNC% % 64  DLCO COR %Predicted % 107  TLC L 4.08  TLC % Predicted % 81  RV % Predicted % 104     Assessment   1. Medication Reconciliation  A drug regimen assessment was performed, including review of allergies, interactions, disease-state management, dosing and immunization history. Medications were reviewed with the patient, including name, instructions, indication, goals of therapy, potential side effects, importance of adherence, and safe use.  Drug interaction(s): no significant drug interactions identified  2. Immunizations  Patient is up-to-date on annual influenza, Prevnar 13, Pneumovax 23, tetanus, and COVID-19 vaccines.  Patient is eligible for Shingrix vaccine.  Patient is concerned about cost of vaccine as it is not covered through Medicare part B or supplemental.  Recommend Shingrix vaccine if  affordable in the future or insurance changes.  Patient verbalized understanding.   PLAN  Continue Pulmicort, Perforomist, and albuterol nebs  Reach out to pharmacy if you have issues with cost on your respiratory medications  All questions encouraged and answered.  Instructed patient to reach out with any further questions or concerns.  Thank you for allowing pharmacy to participate in this patient's care.  This appointment required 65 minutes of patient care (this includes precharting, chart review, review of results, face-to-face care, etc.).  Mariella Saa, PharmD, [place], [employer] [facility] Pharmacist [phone]  07/10/2019 5:24 PM

## 2019-07-09 ENCOUNTER — Ambulatory Visit (INDEPENDENT_AMBULATORY_CARE_PROVIDER_SITE_OTHER): Payer: Medicare Other | Admitting: Psychology

## 2019-07-09 DIAGNOSIS — F331 Major depressive disorder, recurrent, moderate: Secondary | ICD-10-CM | POA: Diagnosis not present

## 2019-07-10 ENCOUNTER — Other Ambulatory Visit: Payer: Self-pay

## 2019-07-10 ENCOUNTER — Ambulatory Visit: Payer: Medicare Other | Admitting: Pharmacist

## 2019-07-10 DIAGNOSIS — Z79899 Other long term (current) drug therapy: Secondary | ICD-10-CM

## 2019-07-11 ENCOUNTER — Telehealth: Payer: Self-pay

## 2019-07-11 NOTE — Telephone Encounter (Signed)
Order # [id number]  Pt notified RX ordered

## 2019-07-11 NOTE — Telephone Encounter (Signed)
New message    The patient checking on the status of the patient assistance medication cariprazine (VRAYLAR) capsule.

## 2019-07-11 NOTE — Telephone Encounter (Signed)
See other TE.

## 2019-07-13 ENCOUNTER — Other Ambulatory Visit: Payer: Self-pay | Admitting: Internal Medicine

## 2019-07-19 ENCOUNTER — Other Ambulatory Visit: Payer: Self-pay

## 2019-07-19 NOTE — Telephone Encounter (Signed)
1.Medication Requested:Promethazine-Codeine 6.25-10 MG/5ML SOLN  2. Pharmacy (Name, [address], [place], [name]  3. On Med List: Yes   4. Last Visit with PCP:  3.9.2021   5. Next visit date with PCP: 6.10.2021    Agent: Please be advised that RX refills may take up to 3 business days. We ask that you follow-up with your pharmacy.

## 2019-07-23 ENCOUNTER — Ambulatory Visit (INDEPENDENT_AMBULATORY_CARE_PROVIDER_SITE_OTHER): Payer: Medicare Other | Admitting: Psychology

## 2019-07-23 DIAGNOSIS — F331 Major depressive disorder, recurrent, moderate: Secondary | ICD-10-CM

## 2019-07-23 NOTE — Telephone Encounter (Signed)
F/u  The patient calling checking on the status of medication refill

## 2019-07-24 ENCOUNTER — Encounter: Payer: Self-pay | Admitting: Critical Care Medicine

## 2019-07-24 ENCOUNTER — Ambulatory Visit (INDEPENDENT_AMBULATORY_CARE_PROVIDER_SITE_OTHER): Payer: Medicare Other | Admitting: Critical Care Medicine

## 2019-07-24 ENCOUNTER — Other Ambulatory Visit: Payer: Self-pay

## 2019-07-24 VITALS — BP 102/64 | HR 68 | Temp 97.9°F | Ht 60.0 in | Wt 152.0 lb

## 2019-07-24 DIAGNOSIS — J4531 Mild persistent asthma with (acute) exacerbation: Secondary | ICD-10-CM | POA: Diagnosis not present

## 2019-07-24 DIAGNOSIS — R0609 Other forms of dyspnea: Secondary | ICD-10-CM

## 2019-07-24 DIAGNOSIS — J479 Bronchiectasis, uncomplicated: Secondary | ICD-10-CM

## 2019-07-24 DIAGNOSIS — R0600 Dyspnea, unspecified: Secondary | ICD-10-CM
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
## 2019-07-24 NOTE — Patient Instructions (Addendum)
Thank you for visiting Dr. Carlis Abbott at [facility]. We recommend the following:  -Continue performist and pulmicort nebulizers.  -Keep using hypertonic saline nebulizer two times daily. -Ok to stop using vest, but if you start making sputum again, you need to restart two times daily.    Return in about 3 months (around 10/24/2019).    Please do your part to reduce the spread of COVID-19.

## 2019-07-25 ENCOUNTER — Ambulatory Visit: Payer: Medicare Other | Admitting: Critical Care Medicine

## 2019-07-25 MED ORDER — PROMETHAZINE-CODEINE 6.25-10 MG/5ML PO SOLN: 5.0000 mL | Freq: Four times a day (QID) | ORAL | 1 refills | Status: DC | PRN

## 2019-08-06 ENCOUNTER — Ambulatory Visit (INDEPENDENT_AMBULATORY_CARE_PROVIDER_SITE_OTHER): Payer: Medicare Other | Admitting: Psychology

## 2019-08-06 ENCOUNTER — Other Ambulatory Visit: Payer: Self-pay | Admitting: Internal Medicine

## 2019-08-06 DIAGNOSIS — F331 Major depressive disorder, recurrent, moderate: Secondary | ICD-10-CM | POA: Diagnosis not present

## 2019-08-20 ENCOUNTER — Ambulatory Visit (INDEPENDENT_AMBULATORY_CARE_PROVIDER_SITE_OTHER): Payer: Medicare Other | Admitting: Psychology

## 2019-08-20 DIAGNOSIS — F331 Major depressive disorder, recurrent, moderate: Secondary | ICD-10-CM | POA: Diagnosis not present

## 2019-08-21 ENCOUNTER — Other Ambulatory Visit: Payer: Self-pay | Admitting: *Deleted

## 2019-08-21 NOTE — Patient Outreach (Signed)
Veronica [name] [facility]) Care Management  08/21/2019  Veronica [name] 1950-05-18 [id number]  Unsuccessful outreach attempt made to patient. RN Health Coach left HIPAA compliant voicemail message along with her contact information.  Plan: RN Health Coach will call patient within the month of July.  Emelia Loron RN, BSN [place] [phone] [name]

## 2019-08-23 ENCOUNTER — Other Ambulatory Visit: Payer: Self-pay

## 2019-08-23 ENCOUNTER — Ambulatory Visit (INDEPENDENT_AMBULATORY_CARE_PROVIDER_SITE_OTHER): Payer: Medicare Other | Admitting: Internal Medicine

## 2019-08-23 ENCOUNTER — Encounter: Payer: Self-pay | Admitting: Internal Medicine

## 2019-08-23 VITALS — BP 158/78 | HR 74 | Temp 98.5°F | Ht 60.0 in | Wt 158.0 lb

## 2019-08-23 DIAGNOSIS — L304 Erythema intertrigo: Secondary | ICD-10-CM | POA: Insufficient documentation

## 2019-08-23 DIAGNOSIS — I25111 Atherosclerotic heart disease of native coronary artery with angina pectoris with documented spasm: Secondary | ICD-10-CM | POA: Diagnosis not present

## 2019-08-23 DIAGNOSIS — I10 Essential (primary) hypertension: Secondary | ICD-10-CM

## 2019-08-23 DIAGNOSIS — R739 Hyperglycemia, unspecified: Secondary | ICD-10-CM

## 2019-08-23 DIAGNOSIS — J479 Bronchiectasis, uncomplicated: Secondary | ICD-10-CM

## 2019-08-23 DIAGNOSIS — D440 Neoplasm of uncertain behavior of thyroid gland: Secondary | ICD-10-CM

## 2019-08-23 LAB — BASIC METABOLIC PANEL
BUN: 5 mg/dL — ABNORMAL LOW (ref 6–23)
CO2: 33 mEq/L — ABNORMAL HIGH (ref 19–32)
Calcium: 8 mg/dL — ABNORMAL LOW (ref 8.4–10.5)
Chloride: 97 mEq/L (ref 96–112)
Creatinine, Ser: 0.7 mg/dL (ref 0.40–1.20)
GFR: 82.63 mL/min (ref >60.00)
Glucose, Bld: 74 mg/dL (ref 70–99)
Potassium: 3.6 mEq/L (ref 3.5–5.1)
Sodium: 137 mEq/L (ref 135–145)

## 2019-08-23 LAB — CBC WITH DIFFERENTIAL/PLATELET
Basophils Absolute: 0.1 10*3/uL (ref 0.0–0.1)
Basophils Relative: 0.6 % (ref 0.0–3.0)
Eosinophils Absolute: 0.2 10*3/uL (ref 0.0–0.7)
Eosinophils Relative: 1.8 % (ref 0.0–5.0)
HCT: 36.9 % (ref 36.0–46.0)
Hemoglobin: 12.4 g/dL (ref 12.0–15.0)
Lymphocytes Relative: 24.9 % (ref 12.0–46.0)
Lymphs Abs: 2.7 10*3/uL (ref 0.7–4.0)
MCHC: 33.7 g/dL (ref 30.0–36.0)
MCV: 85.8 fl (ref 78.0–100.0)
Monocytes Absolute: 1.8 10*3/uL — ABNORMAL HIGH (ref 0.1–1.0)
Monocytes Relative: 16.4 % — ABNORMAL HIGH (ref 3.0–12.0)
Neutro Abs: 6.1 10*3/uL (ref 1.4–7.7)
Neutrophils Relative %: 56.3 % (ref 43.0–77.0)
Platelets: 279 10*3/uL (ref 150.0–400.0)
RBC: 4.3 Mil/uL (ref 3.87–5.11)
RDW: 14.2 % (ref 11.5–15.5)
WBC: 10.8 10*3/uL — ABNORMAL HIGH (ref 4.0–10.5)

## 2019-08-23 LAB — HEPATIC FUNCTION PANEL
ALT: 12 U/L (ref 0–35)
AST: 19 U/L (ref 0–37)
Albumin: 3.9 g/dL (ref 3.5–5.2)
Alkaline Phosphatase: 147 U/L — ABNORMAL HIGH (ref 39–117)
Bilirubin, Direct: 0.1 mg/dL (ref 0.0–0.3)
Total Bilirubin: 0.3 mg/dL (ref 0.2–1.2)
Total Protein: 6.8 g/dL (ref 6.0–8.3)

## 2019-08-23 MED ORDER — NITROGLYCERIN 0.4 MG SL SUBL: SUBLINGUAL_TABLET | SUBLINGUAL | 3 refills | Status: DC

## 2019-08-23 MED ORDER — VERAPAMIL HCL ER 240 MG PO TBCR: EXTENDED_RELEASE_TABLET | ORAL | 3 refills | Status: DC

## 2019-08-23 MED ORDER — KETOCONAZOLE 200 MG PO TABS: 200.0000 mg | ORAL_TABLET | Freq: Every day | ORAL | 0 refills | Status: DC

## 2019-08-23 MED ORDER — TRAZODONE HCL 50 MG PO TABS: ORAL_TABLET | ORAL | 1 refills | Status: DC

## 2019-08-23 MED ORDER — KETOCONAZOLE 2 % EX CREA
1.0000 | TOPICAL_CREAM | Freq: Two times a day (BID) | CUTANEOUS | 1 refills | Status: DC
Start: 2019-08-23 — End: 2019-09-05

## 2019-08-23 NOTE — Addendum Note (Signed)
Addended by: Boris Lown B on: 08/23/2019 02:11 PM   Modules accepted: Orders

## 2019-08-23 NOTE — Progress Notes (Signed)
Subjective:  Patient ID: Veronica [name], female    DOB: 10/08/1950  Age: 69 y.o. MRN: [id number]  CC: No chief complaint on file.   HPI Veronica [name] presents for chronic cough, B12 def, depression   Outpatient Medications Prior to Visit  Medication Sig Dispense Refill  . acetaminophen-codeine (TYLENOL #3) 300-30 MG tablet Take 1 tablet by mouth every 4 (four) hours as needed. 60 tablet 3  . albuterol (PROVENTIL) (2.5 MG/3ML) 0.083% nebulizer solution USE 1 VIAL IN NEBULIZER 2 TIMES DAILY. Generic: VENTOLIN 60 vial 5  . albuterol (VENTOLIN HFA) 108 (90 Base) MCG/ACT inhaler INHALE 1 TO 2 PUFFS INTO THE LUNGS EVERY 4 HOURS AS NEEDED FOR WHEEZING OR SHORTNESS OF BREATH 8.5 g 3  . aspirin 81 MG chewable tablet Chew by mouth daily.    [name] atorvastatin (LIPITOR) 40 MG tablet TAKE 1 TABLET(40 MG) BY MOUTH DAILY 90 tablet 3  . budesonide (PULMICORT) 0.25 MG/2ML nebulizer solution USE 1 VIAL IN NEBULIZER 2 TIMES DAILY. Generic: PULMICORT 120 mL 2  . calcium carbonate (OSCAL) 1500 (600 Ca) MG TABS tablet Take 600 mg of elemental calcium by mouth 2 (two) times daily with a meal.     . cariprazine (VRAYLAR) capsule Take 1 capsule (1.5 mg total) by mouth daily. 90 capsule 3  . cetirizine (ZYRTEC) 5 MG tablet Take 1 tablet (5 mg total) by mouth daily. 30 tablet 1  . Cholecalciferol (VITAMIN D3) 50 MCG (2000 UT) capsule Take 1 capsule (2,000 Units total) by mouth daily. 100 capsule 3  . clonazePAM (KLONOPIN) 1 MG tablet 2 po qhs and 1/2 tab in addition in daytime prn 225 tablet 3  . cyanocobalamin (,VITAMIN B-12,) 1000 MCG/ML injection Inject 1 mL (1,000 mcg total) into the muscle every 14 (fourteen) days. 10 mL 5  . Cyanocobalamin (VITAMIN B-12 PO) Take 5,000 mcg by mouth daily.    . diphenoxylate-atropine (LOMOTIL) 2.5-0.025 MG tablet Take 1-2 tablets by mouth 4 (four) times daily as needed for diarrhea or loose stools. 30 tablet 1  . Docusate Calcium (STOOL SOFTENER PO) Take 100 mg by mouth as  needed (for constipation).     [name] escitalopram (LEXAPRO) 20 MG tablet TAKE 1 TABLET BY MOUTH DAILY 90 tablet 3  . formoterol (PERFOROMIST) 20 MCG/2ML nebulizer solution Take 2 mLs (20 mcg total) by nebulization 2 (two) times daily. 120 mL 3  . furosemide (LASIX) 20 MG tablet TAKE 1 TABLET(20 MG) BY MOUTH DAILY AS NEEDED 90 tablet 3  . levothyroxine (SYNTHROID) 88 MCG tablet TAKE 1 TABLET(88 MCG) BY MOUTH DAILY 90 tablet 3  . nitroGLYCERIN (NITROSTAT) 0.4 MG SL tablet DISSOLVE 1 TABLET UNDER THE TONGUE EVERY 5 MINUTES AS NEEDED FOR CHEST PAIN 25 tablet 1  . Omega-3 Fatty Acids (FISH OIL) 1000 MG CAPS Take 1 capsule by mouth daily.    . phenytoin (DILANTIN) 100 MG ER capsule 200 mg every morning and 100 mg every evening 270 capsule 3  . PREVIDENT 5000 BOOSTER PLUS 1.1 % PSTE BRUSH BY MOUTH TWICE DAILY    . promethazine (PHENERGAN) 12.5 MG tablet 12.5 mg every 6 (six) hours as needed.     . Promethazine-Codeine 6.25-10 MG/5ML SOLN Take 5 mLs by mouth every 6 (six) hours as needed. 300 mL 1  . Respiratory Therapy Supplies (FLUTTER) DEVI Use after nebulization treatments 1 each 0  . sodium chloride (MURO 128) 5 % ophthalmic solution Place 1 drop into both eyes as needed for irritation.     [name]  SYRINGE-NEEDLE, DISP, 3 ML (BD ECLIPSE SYRINGE) 25G X 1" 3 ML MISC Use sq q 2 wks 50 each 3  . traZODone (DESYREL) 50 MG tablet TAKE 4 TABLETS(200 MG) BY MOUTH AT BEDTIME 360 tablet 1  . verapamil (CALAN-SR) 240 MG CR tablet TAKE 1 TABLET(240 MG) BY MOUTH AT BEDTIME 90 tablet 3   No facility-administered medications prior to visit.    ROS: Review of Systems  Constitutional: Negative for activity change, appetite change, chills, fatigue and unexpected weight change.  HENT: Negative for congestion, mouth sores and sinus pressure.   Eyes: Negative for visual disturbance.  Respiratory: Positive for cough. Negative for chest tightness.   Gastrointestinal: Negative for abdominal pain and nausea.  Genitourinary:  Negative for difficulty urinating, frequency and vaginal pain.  Musculoskeletal: Positive for arthralgias and back pain. Negative for gait problem.  Skin: Positive for color change and rash. Negative for pallor.  Neurological: Negative for dizziness, tremors, weakness, numbness and headaches.  Psychiatric/Behavioral: Negative for confusion and sleep disturbance. The patient is nervous/anxious.     Objective:  BP (!) 158/78 (BP Location: Right Arm, Patient Position: Sitting, Cuff Size: Normal)   Pulse 74   Temp 98.5 F (36.9 C) (Oral)   Ht 5' (1.524 m)   Wt 158 lb (71.7 kg)   SpO2 97%   BMI 30.86 kg/m   BP Readings from Last 3 Encounters:  08/23/19 (!) 158/78  07/24/19 102/64  06/12/19 130/80    Wt Readings from Last 3 Encounters:  08/23/19 158 lb (71.7 kg)  07/24/19 152 lb (68.9 kg)  06/12/19 155 lb 12.8 oz (70.7 kg)    Physical Exam Constitutional:      General: She is not in acute distress.    Appearance: She is well-developed.  HENT:     Head: Normocephalic.     Right Ear: External ear normal.     Left Ear: External ear normal.     Nose: Nose normal.  Eyes:     General:        Right eye: No discharge.        Left eye: No discharge.     Conjunctiva/sclera: Conjunctivae normal.     Pupils: Pupils are equal, round, and reactive to light.  Neck:     Thyroid: No thyromegaly.     Vascular: No JVD.     Trachea: No tracheal deviation.  Cardiovascular:     Rate and Rhythm: Normal rate and regular rhythm.     Heart sounds: Normal heart sounds.  Pulmonary:     Effort: No respiratory distress.     Breath sounds: No stridor. No wheezing.  Abdominal:     General: Bowel sounds are normal. There is no distension.     Palpations: Abdomen is soft. There is no mass.     Tenderness: There is no abdominal tenderness. There is no guarding or rebound.  Musculoskeletal:        General: No tenderness.     Cervical back: Normal range of motion and neck supple.  Lymphadenopathy:      Cervical: No cervical adenopathy.  Skin:    Findings: Erythema and rash present.  Neurological:     Mental Status: She is oriented to person, place, and time.     Cranial Nerves: No cranial nerve deficit.     Motor: No abnormal muscle tone.     Coordination: Coordination abnormal.     Gait: Gait abnormal.     Deep Tendon Reflexes: Reflexes normal.  Psychiatric:        Behavior: Behavior normal.        Thought Content: Thought content normal.        Judgment: Judgment normal.   cane Intertrigo R>>L   R nose and L wrist lesions   Lab Results  Component Value Date   WBC 11.1 (H) 05/22/2019   HGB 12.4 05/22/2019   HCT 37.6 05/22/2019   PLT 365.0 05/22/2019   GLUCOSE 70 05/22/2019   CHOL 249 (H) 11/10/2018   TRIG 88.0 11/10/2018   HDL 57.70 11/10/2018   LDLDIRECT 165.0 09/26/2009   LDLCALC 174 (H) 11/10/2018   ALT 12 05/22/2019   AST 16 05/22/2019   NA 140 05/22/2019   K 4.2 05/22/2019   CL 101 05/22/2019   CREATININE 0.64 05/22/2019   BUN 7 05/22/2019   CO2 34 (H) 05/22/2019   TSH 2.22 11/10/2018   INR 1.0 ratio 11/07/2009   HGBA1C 5.6 05/22/2019    MR 3D Recon At Scanner  Result Date: 02/12/2019 CLINICAL DATA:  Right upper quadrant abdominal pain. Suspected cholecystitis. EXAM: MRI ABDOMEN WITHOUT AND WITH CONTRAST (INCLUDING MRCP) TECHNIQUE: Multiplanar multisequence MR imaging of the abdomen was performed both before and after the administration of intravenous contrast. Heavily T2-weighted images of the biliary and pancreatic ducts were obtained, and three-dimensional MRCP images were rendered by post processing. CONTRAST:  39mL GADAVIST GADOBUTROL 1 MMOL/ML IV SOLN COMPARISON:  Abdominal ultrasound 08/07/2015. Abdominal CT 08/31/2010. FINDINGS: Despite efforts by the technologist and patient, mild motion artifact is present on today's exam and could not be eliminated. This reduces exam sensitivity and specificity. Lower chest: There are no significant findings  within the visualized lower chest. Hepatobiliary: Small hepatic cysts are noted. There are new suspicious hepatic findings. The gallbladder is surgically absent. There is mild fusiform extrahepatic biliary dilatation with the common hepatic duct measuring up to 8 mm in diameter. The duct tapers distally and appears similar to previous CT. No evidence of choledocholithiasis or intrahepatic biliary dilatation. Pancreas: Unremarkable. No pancreatic ductal dilatation or surrounding inflammatory changes. Stable prominent fat between the pancreatic head and the superior mesenteric vein. Spleen: Normal in size without focal abnormality. Adrenals/Urinary Tract: Both adrenal glands appear normal. Small right renal cyst. No evidence of renal mass or hydronephrosis. Stomach/Bowel: No evidence of bowel wall thickening, distention or surrounding inflammatory change. Vascular/Lymphatic: There are no enlarged abdominal lymph nodes. Aortic and branch vessel atherosclerosis. No acute vascular findings identified. Other: The visualized anterior abdominal wall appears intact. No ascites. Musculoskeletal: No acute or significant osseous findings. Facet hypertrophy and associated synovial enhancement noted within the mid lumbar spine. IMPRESSION: 1. No acute findings or explanation for the patient's symptoms. 2. Mild fusiform extrahepatic biliary dilatation status post cholecystectomy, similar to previous CT. No evidence of choledocholithiasis or intrahepatic biliary dilatation. 3.  Aortic Atherosclerosis (ICD10-I70.0). Electronically Signed   By: Richardean Sale M.D.   On: 02/12/2019 13:07   MR ABD/MRCP  Result Date: 02/12/2019 CLINICAL DATA:  Right upper quadrant abdominal pain. Suspected cholecystitis. EXAM: MRI ABDOMEN WITHOUT AND WITH CONTRAST (INCLUDING MRCP) TECHNIQUE: Multiplanar multisequence MR imaging of the abdomen was performed both before and after the administration of intravenous contrast. Heavily T2-weighted images  of the biliary and pancreatic ducts were obtained, and three-dimensional MRCP images were rendered by post processing. CONTRAST:  109mL GADAVIST GADOBUTROL 1 MMOL/ML IV SOLN COMPARISON:  Abdominal ultrasound 08/07/2015. Abdominal CT 08/31/2010. FINDINGS: Despite efforts by the technologist and patient, mild motion artifact is present on  today's exam and could not be eliminated. This reduces exam sensitivity and specificity. Lower chest: There are no significant findings within the visualized lower chest. Hepatobiliary: Small hepatic cysts are noted. There are new suspicious hepatic findings. The gallbladder is surgically absent. There is mild fusiform extrahepatic biliary dilatation with the common hepatic duct measuring up to 8 mm in diameter. The duct tapers distally and appears similar to previous CT. No evidence of choledocholithiasis or intrahepatic biliary dilatation. Pancreas: Unremarkable. No pancreatic ductal dilatation or surrounding inflammatory changes. Stable prominent fat between the pancreatic head and the superior mesenteric vein. Spleen: Normal in size without focal abnormality. Adrenals/Urinary Tract: Both adrenal glands appear normal. Small right renal cyst. No evidence of renal mass or hydronephrosis. Stomach/Bowel: No evidence of bowel wall thickening, distention or surrounding inflammatory change. Vascular/Lymphatic: There are no enlarged abdominal lymph nodes. Aortic and branch vessel atherosclerosis. No acute vascular findings identified. Other: The visualized anterior abdominal wall appears intact. No ascites. Musculoskeletal: No acute or significant osseous findings. Facet hypertrophy and associated synovial enhancement noted within the mid lumbar spine. IMPRESSION: 1. No acute findings or explanation for the patient's symptoms. 2. Mild fusiform extrahepatic biliary dilatation status post cholecystectomy, similar to previous CT. No evidence of choledocholithiasis or intrahepatic biliary  dilatation. 3.  Aortic Atherosclerosis (ICD10-I70.0). Electronically Signed   By: Richardean Sale M.D.   On: 02/12/2019 13:07    Assessment & Plan:    Follow-up: No follow-ups on file.  Walker Kehr, MD

## 2019-08-23 NOTE — Assessment & Plan Note (Signed)
Will bx - R nose and L wrist lesions

## 2019-08-23 NOTE — Assessment & Plan Note (Signed)
On dilantin

## 2019-08-23 NOTE — Assessment & Plan Note (Signed)
Monitor BP at home NAS diet

## 2019-08-23 NOTE — Assessment & Plan Note (Signed)
NTG prn No CP

## 2019-08-23 NOTE — Patient Instructions (Addendum)
Buy athletic moisture wicking underwear Avoid sweating

## 2019-08-23 NOTE — Assessment & Plan Note (Signed)
R>>L groin Ketocon cream and tabs if not better

## 2019-08-23 NOTE — Assessment & Plan Note (Signed)
Prom-cod for cough prn

## 2019-08-24 ENCOUNTER — Ambulatory Visit: Payer: Self-pay | Admitting: *Deleted

## 2019-08-24 LAB — HEMOGLOBIN A1C: Hgb A1c MFr Bld: 5.6 % (ref 4.6–6.5)

## 2019-08-28 ENCOUNTER — Telehealth: Payer: Self-pay | Admitting: Internal Medicine

## 2019-08-28 NOTE — Telephone Encounter (Signed)
Please return  call to discuss lab results 

## 2019-08-28 NOTE — Telephone Encounter (Signed)
Called pt, see 08/23/2019 lab results

## 2019-09-03 ENCOUNTER — Ambulatory Visit (INDEPENDENT_AMBULATORY_CARE_PROVIDER_SITE_OTHER): Payer: Medicare Other | Admitting: Psychology

## 2019-09-03 DIAGNOSIS — F331 Major depressive disorder, recurrent, moderate: Secondary | ICD-10-CM | POA: Diagnosis not present

## 2019-09-05 ENCOUNTER — Other Ambulatory Visit: Payer: Self-pay | Admitting: Internal Medicine

## 2019-09-05 ENCOUNTER — Ambulatory Visit (INDEPENDENT_AMBULATORY_CARE_PROVIDER_SITE_OTHER): Payer: Medicare Other | Admitting: Internal Medicine

## 2019-09-05 ENCOUNTER — Encounter: Payer: Self-pay | Admitting: Internal Medicine

## 2019-09-05 ENCOUNTER — Telehealth: Payer: Self-pay

## 2019-09-05 ENCOUNTER — Other Ambulatory Visit: Payer: Self-pay

## 2019-09-05 DIAGNOSIS — M810 Age-related osteoporosis without current pathological fracture: Secondary | ICD-10-CM

## 2019-09-05 DIAGNOSIS — L739 Follicular disorder, unspecified: Secondary | ICD-10-CM | POA: Diagnosis not present

## 2019-09-05 DIAGNOSIS — D2262 Melanocytic nevi of left upper limb, including shoulder: Secondary | ICD-10-CM

## 2019-09-05 DIAGNOSIS — F329 Major depressive disorder, single episode, unspecified: Secondary | ICD-10-CM

## 2019-09-05 DIAGNOSIS — D485 Neoplasm of uncertain behavior of skin: Secondary | ICD-10-CM

## 2019-09-05 DIAGNOSIS — I25111 Atherosclerotic heart disease of native coronary artery with angina pectoris with documented spasm: Secondary | ICD-10-CM

## 2019-09-05 DIAGNOSIS — L304 Erythema intertrigo: Secondary | ICD-10-CM | POA: Diagnosis not present

## 2019-09-05 MED ORDER — CARIPRAZINE HCL 1.5 MG PO CAPS: 1.5000 mg | ORAL_CAPSULE | Freq: Every day | ORAL | 3 refills | Status: DC

## 2019-09-05 MED ORDER — KETOCONAZOLE 2 % EX CREA: 1.0000 "application " | TOPICAL_CREAM | Freq: Two times a day (BID) | CUTANEOUS | 1 refills | Status: AC

## 2019-09-05 MED ORDER — DENOSUMAB 60 MG/ML ~~LOC~~ SOSY
60.0000 mg | PREFILLED_SYRINGE | Freq: Once | SUBCUTANEOUS | Status: AC
  Administered 2019-09-05: 60 mg via SUBCUTANEOUS

## 2019-09-05 NOTE — Telephone Encounter (Signed)
I called the above number patient assistance program to order her Vraylar 1.5 mg. The automated states it is too soon to fill this. Per most recent packing list, last shipment was on 07/11/2019 for a quantity of 90. I will try again in 2 weeks.

## 2019-09-05 NOTE — Assessment & Plan Note (Signed)
Prolia inj today

## 2019-09-05 NOTE — Patient Instructions (Addendum)
Postprocedure instructions :    A Band-Aid should be  Changed once or twice daily. You can take a shower tomorrow.  Keep the wounds clean. You can wash them with liquid soap and water. Pat dry with gauze or a Kleenex tissue before applying antibiotic ointment and a Band-Aid.

## 2019-09-05 NOTE — Telephone Encounter (Signed)
Informed pt of below.

## 2019-09-05 NOTE — Assessment & Plan Note (Addendum)
Better by 60% Cream renewed

## 2019-09-05 NOTE — Assessment & Plan Note (Signed)
Multiple -  Face x1 and L wrist x5

## 2019-09-05 NOTE — Addendum Note (Signed)
Addended by: Darlys Gales on: 09/05/2019 04:34 PM   Modules accepted: Orders

## 2019-09-05 NOTE — Progress Notes (Signed)
The patient presents for a follow-up of rash, osteoporosis. Rash is better   Procedure Note :     Procedure :  Skin biopsy   Indication:  Changing mole (s ),  Suspicious lesion(s)   Risks including unsuccessful procedure , bleeding, infection, bruising, scar, a need for another complete procedure and others were explained to the patient in detail as well as the benefits. Informed consent was obtained.  The patient was placed in a decubitus position.  Lesion #1 on  R nose   measuring  6x4   mm   Skin over lesion #1  was prepped with Betadine and alcohol  and anesthetized with 1 cc of 2% lidocaine and epinephrine, using a 25-gauge 1 inch needle.  Shave biopsy with a sterile Dermablade was carried out in the usual fashion. Hyfrecator was used to destroy the rest of the lesion potentially left behind and for hemostasis. Band-Aid was applied with antibiotic ointment.    Lesion #2 on  L wrist/forearm   measuring 6x4  mm   Skin over lesion #2  was prepped with Betadine and alcohol  and anesthetized with 1 cc of 2% lidocaine and epinephrine, using a 25-gauge 1 inch needle.  Shave biopsy with a sterile Dermablade was carried out in the usual fashion. Hyfrecator was used to destroy the rest of the lesion potentially left behind and for hemostasis. Band-Aid was applied with antibiotic ointment.  Lesions  #3-5 L wrist/forearm   measuring 4x4  Mm - discarded   Skin over lesion #3  was prepped with Betadine and alcohol  and anesthetized with 1 cc of 2% lidocaine and epinephrine, using a 25-gauge 1 inch needle.  Shave biopsy with a sterile Dermablade was carried out in the usual fashion. Hyfrecator was used to destroy the rest of the lesion potentially left behind and for hemostasis. Band-Aid was applied with antibiotic ointment.   Tolerated well. Complications none.      Postprocedure instructions :    A Band-Aid should be  changed twice daily. You can take a shower tomorrow.  Keep the wounds  clean. You can wash them with liquid soap and water. Pat dry with gauze or a Kleenex tissue  Before applying antibiotic ointment and a Band-Aid.   You need to report immediately  if fever, chills or any signs of infection develop.    The biopsy results should be available in 1 -2 weeks.

## 2019-09-05 NOTE — Assessment & Plan Note (Signed)
Vraylar Rx

## 2019-09-06 ENCOUNTER — Other Ambulatory Visit: Payer: Self-pay | Admitting: *Deleted

## 2019-09-06 NOTE — Patient Outreach (Signed)
[facility]) Care Management  [place]  09/06/2019   Veronica [name] 28-Sep-1950 [id number]  Subjective: Successful telephone outreach call to patient. HIPAA identifiers obtained. Patient states she is doing well. She explained that her COPD is well controlled for this time of year. She is avoiding being outside during the hottest portion to the day and uses her rescue inhaler per patient 1-2 times daily which is typical for her. Patient reports that she does want to lose weight and increase her physical activity to improve her breathing status and overall health. Patient states that she fell out of bed recently but denies any concerning injury from fall. Emotionally, she is doing well and continues to see LCSW every two weeks for counseling. Per patient her COPD is controlled at baseline and she does not have any other concerns at this time.   Encounter Medications:  Outpatient Encounter Medications as of 09/06/2019  Medication Sig Note  . acetaminophen-codeine (TYLENOL #3) 300-30 MG tablet Take 1 tablet by mouth every 4 (four) hours as needed. 07/10/2019: Used 3 times in the past week.  Indication migraines  . albuterol (PROVENTIL) (2.5 MG/3ML) 0.083% nebulizer solution USE 1 VIAL IN NEBULIZER 2 TIMES DAILY. Generic: VENTOLIN 07/10/2019: Uses 1 vial two times daily.  [name] albuterol (VENTOLIN HFA) 108 (90 Base) MCG/ACT inhaler INHALE 1 TO 2 PUFFS INTO THE LUNGS EVERY 4 HOURS AS NEEDED FOR WHEEZING OR SHORTNESS OF BREATH   . aspirin 81 MG chewable tablet Chew by mouth daily.   [name] atorvastatin (LIPITOR) 40 MG tablet TAKE 1 TABLET(40 MG) BY MOUTH DAILY   . budesonide (PULMICORT) 0.25 MG/2ML nebulizer solution USE 1 VIAL IN NEBULIZER 2 TIMES DAILY. Generic: PULMICORT   . calcium carbonate (OSCAL) 1500 (600 Ca) MG TABS tablet Take 600 mg of elemental calcium by mouth 2 (two) times daily with a meal.    . cariprazine (VRAYLAR) capsule Take 1 capsule (1.5 mg total) by mouth daily.    . cetirizine (ZYRTEC) 5 MG tablet Take 1 tablet (5 mg total) by mouth daily. 03/23/2019: PRN  . Cholecalciferol (VITAMIN D3) 50 MCG (2000 UT) capsule Take 1 capsule (2,000 Units total) by mouth daily.   . clonazePAM (KLONOPIN) 1 MG tablet 2 po qhs and 1/2 tab in addition in daytime prn 07/10/2019: Infrequent use of 1/2 tablet during the day.  . cyanocobalamin (,VITAMIN B-12,) 1000 MCG/ML injection Inject 1 mL (1,000 mcg total) into the muscle every 14 (fourteen) days.   . Cyanocobalamin (VITAMIN B-12 PO) Take 5,000 mcg by mouth daily.   . diphenoxylate-atropine (LOMOTIL) 2.5-0.025 MG tablet Take 1-2 tablets by mouth 4 (four) times daily as needed for diarrhea or loose stools. 07/10/2019: Last used 2 weeks ago.  Mariane Baumgarten Calcium (STOOL SOFTENER PO) Take 100 mg by mouth as needed (for constipation).  07/10/2019: Last used 2 weeks ago.  . escitalopram (LEXAPRO) 20 MG tablet TAKE 1 TABLET BY MOUTH DAILY   . formoterol (PERFOROMIST) 20 MCG/2ML nebulizer solution Take 2 mLs (20 mcg total) by nebulization 2 (two) times daily.   . furosemide (LASIX) 20 MG tablet TAKE 1 TABLET(20 MG) BY MOUTH DAILY AS NEEDED   . ketoconazole (NIZORAL) 2 % cream Apply 1 application topically 2 (two) times daily. On rash   . ketoconazole (NIZORAL) 200 MG tablet Take 1 tablet (200 mg total) by mouth daily.   [name] levothyroxine (SYNTHROID) 88 MCG tablet TAKE 1 TABLET(88 MCG) BY MOUTH DAILY   . nitroGLYCERIN (NITROSTAT) 0.4 MG SL  tablet As directed SL   . Omega-3 Fatty Acids (FISH OIL) 1000 MG CAPS Take 1 capsule by mouth daily.   . phenytoin (DILANTIN) 100 MG ER capsule 200 mg every morning and 100 mg every evening   . PREVIDENT 5000 BOOSTER PLUS 1.1 % PSTE BRUSH BY MOUTH TWICE DAILY   . promethazine (PHENERGAN) 12.5 MG tablet 12.5 mg every 6 (six) hours as needed.  07/10/2019: Using as needed for nausea associated with migraines.  Last used 3 weeks ago.  . Promethazine-Codeine 6.25-10 MG/5ML SOLN Take 5 mLs by mouth every 6 (six)  hours as needed.   [name] Respiratory Therapy Supplies (FLUTTER) DEVI Use after nebulization treatments   . sodium chloride (MURO 128) 5 % ophthalmic solution Place 1 drop into both eyes as needed for irritation.  07/10/2019: She is using daily.  . SYRINGE-NEEDLE, DISP, 3 ML (BD ECLIPSE SYRINGE) 25G X 1" 3 ML MISC Use sq q 2 wks   . traZODone (DESYREL) 50 MG tablet TAKE 4 TABLETS(200 MG) BY MOUTH AT BEDTIME   . verapamil (CALAN-SR) 240 MG CR tablet TAKE 1 TABLET(240 MG) BY MOUTH AT BEDTIME    No facility-administered encounter medications on file as of 09/06/2019.    Functional Status:  In your present state of health, do you have any difficulty performing the following activities: 05/08/2019 02/27/2019  Hearing? N N  Vision? N N  Difficulty concentrating or making decisions? N N  Walking or climbing stairs? N N  Dressing or bathing? N N  Doing errands, shopping? N N  Preparing Food and eating ? N N  Using the Toilet? N N  In the past six months, have you accidently leaked urine? N N  Do you have problems with loss of bowel control? N N  Managing your Medications? N N  Managing your Finances? N N  Housekeeping or managing your Housekeeping? N N  Some recent data might be hidden    Fall/Depression Screening: Fall Risk  09/06/2019 06/21/2019 05/08/2019  Falls in the past year? 1 1 1   Comment - - -  Number falls in past yr: 1 0 0  Comment - - -  Injury with Fall? 0 0 0  Risk for fall due to : - - Impaired balance/gait  Follow up Falls prevention discussed;Education provided;Falls evaluation completed Falls prevention discussed;Education provided;Falls evaluation completed Falls prevention discussed;Education provided;Falls evaluation completed   PHQ 2/9 Scores 05/08/2019 02/27/2019 02/21/2018 03/04/2017 02/18/2017 02/11/2017 10/07/2015  PHQ - 2 Score 4 4 4 1 6 6  0  PHQ- 9 Score 7 8 9  - 13 9 -   [facility] CM Care Plan Problem One     Most Recent Value  Care Plan Problem One [employer] gas exchange  related to COPD as evidenced by shortness of breath and reduced activity tolerance  Role Documenting the Problem One [place] for Problem One Active  [facility] Long Term Goal  patient will not have an admission for COPD exacerbation within the next 90 days  [facility] Long Term Goal Start Date 06/21/19  Interventions for Problem One Long Term Goal RN discusses action plans for COPD exacerbation, discussed COPD zones, encourage patient to take medications as prescribed, encouraged patient to stay as active as tolerated daily, discussed running errands early to avoid the heat of the day and higher allergens  [facility] CM Short Term Goal #1  Patient will lose weight as evidenced by patient stating she has lost weight within 30 days  [facility] CM  Short Term Goal #1 Start Date 09/06/19  Interventions for Short Term Goal #1 Patient has goal of losing weight to improve health: Nurse discussed healthy diet and weight management, Provided [employer] television exercise program resource for seniors, encouraged patient to increase her physical activity by doing chair exercises, will send planning healthy meals and exercise program activity booklets, will send EMMI weight loss tips and weight loss diet.     Plan: RN Health Coach will send PCP today's assessment note, will send patient weight loss and exercise education, will call patient within the month of July and patient agrees to future outreach calls.   Emelia Loron RN, BSN [place] [phone] [email or web address]

## 2019-09-18 ENCOUNTER — Ambulatory Visit (INDEPENDENT_AMBULATORY_CARE_PROVIDER_SITE_OTHER): Payer: Medicare Other | Admitting: Psychology

## 2019-09-18 ENCOUNTER — Ambulatory Visit: Payer: Self-pay | Admitting: *Deleted

## 2019-09-18 DIAGNOSIS — F331 Major depressive disorder, recurrent, moderate: Secondary | ICD-10-CM | POA: Diagnosis not present

## 2019-10-01 ENCOUNTER — Ambulatory Visit: Payer: Self-pay | Admitting: *Deleted

## 2019-10-01 ENCOUNTER — Ambulatory Visit (INDEPENDENT_AMBULATORY_CARE_PROVIDER_SITE_OTHER): Payer: Medicare Other | Admitting: Psychology

## 2019-10-01 DIAGNOSIS — F331 Major depressive disorder, recurrent, moderate: Secondary | ICD-10-CM | POA: Diagnosis not present

## 2019-10-04 ENCOUNTER — Other Ambulatory Visit: Payer: Self-pay | Admitting: Internal Medicine

## 2019-10-05 ENCOUNTER — Other Ambulatory Visit: Payer: Self-pay | Admitting: Internal Medicine

## 2019-10-08 ENCOUNTER — Telehealth: Payer: Self-pay | Admitting: Internal Medicine

## 2019-10-08 ENCOUNTER — Other Ambulatory Visit: Payer: Self-pay | Admitting: *Deleted

## 2019-10-08 ENCOUNTER — Encounter: Payer: Self-pay | Admitting: Critical Care Medicine

## 2019-10-08 NOTE — Telephone Encounter (Signed)
F/u   Company name is  [employer].   Asking for a call back and wanted the CMA to be aware she is almost out of her medication

## 2019-10-08 NOTE — Telephone Encounter (Signed)
New message:  Pt is calling and states she needs a refill on her pt assistant medication. She states it is cariprazine (VRAYLAR) capsule. She ask if we can please order this today. Please advise.

## 2019-10-08 NOTE — Patient Outreach (Signed)
[facility]) Care Management  [place]  10/08/2019   Veronica [name] 02/02/1951 [id number]  Subjective: Successful telephone outreach call to patient. HIPAA identifiers obtained. Patient states she is doing well. She reports that her COPD is at baseline and that she continues to run her errands early to avoid the humidity and heat. Patient states she is using her rescue inhaler 1-2 times daily which is typical for her. She is eating a healthy diet to lose weight and has increased her physical activity by walking and doing chair exercises routinely. Patient reports that she has lost several pounds. Patient continues to see her LCSW every two weeks and emotionally she feels stable. She reports Arman Filter works well for her and she is committed to getting up and leaving the house daily. Patient explains that her B/P cuff is not working and nurse states she will send her another one in the mail along with ensure coupons.   Encounter Medications:  Outpatient Encounter Medications as of 10/08/2019  Medication Sig Note  . acetaminophen-codeine (TYLENOL #3) 300-30 MG tablet Take 1 tablet by mouth every 4 (four) hours as needed. 07/10/2019: Used 3 times in the past week.  Indication migraines  . albuterol (PROVENTIL) (2.5 MG/3ML) 0.083% nebulizer solution USE 1 VIAL IN NEBULIZER 2 TIMES DAILY. Generic: VENTOLIN 07/10/2019: Uses 1 vial two times daily.  [name] albuterol (VENTOLIN HFA) 108 (90 Base) MCG/ACT inhaler INHALE 1 TO 2 PUFFS INTO THE LUNGS EVERY 4 HOURS AS NEEDED FOR WHEEZING OR SHORTNESS OF BREATH   . aspirin 81 MG chewable tablet Chew by mouth daily.   [name] atorvastatin (LIPITOR) 40 MG tablet TAKE 1 TABLET(40 MG) BY MOUTH DAILY   . budesonide (PULMICORT) 0.25 MG/2ML nebulizer solution USE 1 VIAL IN NEBULIZER 2 TIMES DAILY. Generic: PULMICORT   . calcium carbonate (OSCAL) 1500 (600 Ca) MG TABS tablet Take 600 mg of elemental calcium by mouth 2 (two) times daily with a meal.    .  cariprazine (VRAYLAR) capsule Take 1 capsule (1.5 mg total) by mouth daily.   . cetirizine (ZYRTEC) 5 MG tablet Take 1 tablet (5 mg total) by mouth daily. 03/23/2019: PRN  . Cholecalciferol (VITAMIN D3) 50 MCG (2000 UT) capsule Take 1 capsule (2,000 Units total) by mouth daily.   . clonazePAM (KLONOPIN) 1 MG tablet 2 po qhs and 1/2 tab in addition in daytime prn 07/10/2019: Infrequent use of 1/2 tablet during the day.  . cyanocobalamin (,VITAMIN B-12,) 1000 MCG/ML injection Inject 1 mL (1,000 mcg total) into the muscle every 14 (fourteen) days.   . Cyanocobalamin (VITAMIN B-12 PO) Take 5,000 mcg by mouth daily.   . diphenoxylate-atropine (LOMOTIL) 2.5-0.025 MG tablet Take 1-2 tablets by mouth 4 (four) times daily as needed for diarrhea or loose stools. 07/10/2019: Last used 2 weeks ago.  Mariane Baumgarten Calcium (STOOL SOFTENER PO) Take 100 mg by mouth as needed (for constipation).  07/10/2019: Last used 2 weeks ago.  . escitalopram (LEXAPRO) 20 MG tablet TAKE 1 TABLET BY MOUTH DAILY   . formoterol (PERFOROMIST) 20 MCG/2ML nebulizer solution Take 2 mLs (20 mcg total) by nebulization 2 (two) times daily.   . furosemide (LASIX) 20 MG tablet TAKE 1 TABLET(20 MG) BY MOUTH DAILY AS NEEDED   . ketoconazole (NIZORAL) 2 % cream Apply 1 application topically 2 (two) times daily. On rash   . ketoconazole (NIZORAL) 200 MG tablet Take 1 tablet (200 mg total) by mouth daily.   [name] levothyroxine (SYNTHROID) 88 MCG tablet  TAKE 1 TABLET(88 MCG) BY MOUTH DAILY   . nitroGLYCERIN (NITROSTAT) 0.4 MG SL tablet As directed SL   . Omega-3 Fatty Acids (FISH OIL) 1000 MG CAPS Take 1 capsule by mouth daily.   . phenytoin (DILANTIN) 100 MG ER capsule 200 mg every morning and 100 mg every evening   . PREVIDENT 5000 BOOSTER PLUS 1.1 % PSTE BRUSH BY MOUTH TWICE DAILY   . promethazine (PHENERGAN) 12.5 MG tablet 12.5 mg every 6 (six) hours as needed.  07/10/2019: Using as needed for nausea associated with migraines.  Last used 3 weeks ago.  .  Promethazine-Codeine 6.25-10 MG/5ML SOLN Take 5 mLs by mouth every 6 (six) hours as needed.   [name] Respiratory Therapy Supplies (FLUTTER) DEVI Use after nebulization treatments   . sodium chloride (MURO 128) 5 % ophthalmic solution Place 1 drop into both eyes as needed for irritation.  07/10/2019: She is using daily.  . SYRINGE-NEEDLE, DISP, 3 ML (BD ECLIPSE SYRINGE) 25G X 1" 3 ML MISC Use sq q 2 wks   . traZODone (DESYREL) 50 MG tablet TAKE 4 TABLETS(200 MG) BY MOUTH AT BEDTIME   . verapamil (CALAN-SR) 240 MG CR tablet TAKE 1 TABLET(240 MG) BY MOUTH AT BEDTIME    No facility-administered encounter medications on file as of 10/08/2019.    Functional Status:  In your present state of health, do you have any difficulty performing the following activities: 05/08/2019 02/27/2019  Hearing? N N  Vision? N N  Difficulty concentrating or making decisions? N N  Walking or climbing stairs? N N  Dressing or bathing? N N  Doing errands, shopping? N N  Preparing Food and eating ? N N  Using the Toilet? N N  In the past six months, have you accidently leaked urine? N N  Do you have problems with loss of bowel control? N N  Managing your Medications? N N  Managing your Finances? N N  Housekeeping or managing your Housekeeping? N N  Some recent data might be hidden    Fall/Depression Screening: Fall Risk  10/08/2019 09/06/2019 06/21/2019  Falls in the past year? 1 1 1   Comment - - -  Number falls in past yr: 0 1 0  Comment - - -  Injury with Fall? 0 0 0  Risk for fall due to : - - -  Follow up Falls prevention discussed;Education provided;Falls evaluation completed Falls prevention discussed;Education provided;Falls evaluation completed Falls prevention discussed;Education provided;Falls evaluation completed   PHQ 2/9 Scores 05/08/2019 02/27/2019 02/21/2018 03/04/2017 02/18/2017 02/11/2017 10/07/2015  PHQ - 2 Score 4 4 4 1 6 6  0  PHQ- 9 Score 7 8 9  - 13 9 -   Goals Addressed            This Visit's  Progress   . Patient will not have any ED or hospital visits within the next 90  days       [place] (see longtitudinal plan of care for additional care plan information)  Current Barriers:  [name] Knowledge deficits related to basic COPD self care/management   Case Manager Clinical Goal(s):  Over the next 90 days, patient will be able to verbalize understanding of COPD action plan and when to seek appropriate levels of medical care  Over the next 90 days, patient will engage in lite exercise as tolerated to build/regain stamina and strength and reduce shortness of breath through activity tolerance  Over the next 90 days, patient will not be hospitalized for COPD  exacerbation   Interventions:   Provided patient with COPD action plan and reinforced importance of daily self assessment  Advised patient to self assesses COPD action plan zone and make appointment with provider if in the yellow zone for 48 hours without improvement.  Advised patient to engage in light exercise as tolerated 3-5 days a week  Patient Self Care Activities:  . Takes medications as prescribed including inhalers . Practices and uses pursed lip breathing for shortness of breath recovery and prevention . Self assesses COPD action plan zone and makes appointment with provider if in the yellow zone for 48 hours without improvement.        Plan: RN Health Coach will send patient a B/P cuff and Ensure coupons, will call patient within the month of September, and patient agrees to future outreach calls.   Emelia Loron RN, BSN [place] [phone] [email or web address]

## 2019-10-09 ENCOUNTER — Ambulatory Visit: Payer: Self-pay | Admitting: *Deleted

## 2019-10-09 MED ORDER — CARIPRAZINE HCL 1.5 MG PO CAPS: 1.5000 mg | ORAL_CAPSULE | Freq: Every day | ORAL | 3 refills | Status: DC

## 2019-10-09 NOTE — Telephone Encounter (Signed)
I called [facility] at [phone] that states they've switched companies to [place] and since the initiation application for assistance has the previous address, I would need to print out a new Rx stating an address change on it and fax to [phone]. I have sent that fax and notified pt. Will follow up with [employer].  Pt new ID number is [id number] Pt will need a new application in December and can be done on [email or web address]

## 2019-10-12 NOTE — Telephone Encounter (Signed)
Spoke with Dina Rich that's states the pt's order for Arman Filter was placed yesterday and should arrive here within 5-7 business days.

## 2019-10-15 ENCOUNTER — Ambulatory Visit (INDEPENDENT_AMBULATORY_CARE_PROVIDER_SITE_OTHER): Payer: Medicare Other | Admitting: Psychology

## 2019-10-15 DIAGNOSIS — F331 Major depressive disorder, recurrent, moderate: Secondary | ICD-10-CM | POA: Diagnosis not present

## 2019-10-16 ENCOUNTER — Other Ambulatory Visit: Payer: Self-pay | Admitting: Internal Medicine

## 2019-10-23 ENCOUNTER — Telehealth: Payer: Self-pay | Admitting: Internal Medicine

## 2019-10-23 NOTE — Telephone Encounter (Signed)
Paperwork on Dr [employer].

## 2019-10-23 NOTE — Telephone Encounter (Signed)
Veronica [name] came to the office this morning with a letter from [facility] for her Vraylar prescription. It was sent to [facility], [place] [address]  [place], [place] [phone]. This is a patient assistance medication and the letter she brought with her states that it is missing the providers office number. The letter along with the [employer] application are in Dr. Judeen Hammans box. The application has been filled out and just needs Dr. Judeen Hammans signature and his DEA/SLN and DEA/SLN expiration date. She states that she only has a few pills left.

## 2019-10-24 NOTE — Telephone Encounter (Signed)
I called pt and left vm informing her that her medication is in office for pick. I also have the paperwork that needs to be signed in order for pt to receive future refills

## 2019-10-29 ENCOUNTER — Ambulatory Visit (INDEPENDENT_AMBULATORY_CARE_PROVIDER_SITE_OTHER): Payer: Medicare Other | Admitting: Psychology

## 2019-10-29 DIAGNOSIS — F331 Major depressive disorder, recurrent, moderate: Secondary | ICD-10-CM

## 2019-10-30 ENCOUNTER — Other Ambulatory Visit: Payer: Self-pay | Admitting: Internal Medicine

## 2019-10-30 NOTE — Telephone Encounter (Signed)
[place] Controlled Database Checked Last filled: 08/22/2019 (300) LOV w/you: 09/05/2019 Next appt w/you: 11/22/2019

## 2019-10-30 NOTE — Telephone Encounter (Signed)
   Patient requesting refill on Promethazine-Codeine 6.25-10 MG/5ML [place], [place]

## 2019-11-01 MED ORDER — PROMETHAZINE-CODEINE 6.25-10 MG/5ML PO SOLN: 5.0000 mL | Freq: Four times a day (QID) | ORAL | 1 refills | Status: DC | PRN

## 2019-11-09 ENCOUNTER — Other Ambulatory Visit: Payer: Self-pay | Admitting: Internal Medicine

## 2019-11-11 ENCOUNTER — Other Ambulatory Visit: Payer: Self-pay | Admitting: Internal Medicine

## 2019-11-12 ENCOUNTER — Ambulatory Visit (INDEPENDENT_AMBULATORY_CARE_PROVIDER_SITE_OTHER): Payer: Medicare Other | Admitting: Psychology

## 2019-11-12 DIAGNOSIS — F331 Major depressive disorder, recurrent, moderate: Secondary | ICD-10-CM

## 2019-11-14 ENCOUNTER — Other Ambulatory Visit: Payer: Self-pay | Admitting: Internal Medicine

## 2019-11-22 ENCOUNTER — Other Ambulatory Visit: Payer: Self-pay

## 2019-11-22 ENCOUNTER — Ambulatory Visit (INDEPENDENT_AMBULATORY_CARE_PROVIDER_SITE_OTHER): Payer: Medicare Other | Admitting: Internal Medicine

## 2019-11-22 ENCOUNTER — Encounter: Payer: Self-pay | Admitting: Internal Medicine

## 2019-11-22 DIAGNOSIS — E538 Deficiency of other specified B group vitamins: Secondary | ICD-10-CM | POA: Diagnosis not present

## 2019-11-22 DIAGNOSIS — E559 Vitamin D deficiency, unspecified: Secondary | ICD-10-CM

## 2019-11-22 DIAGNOSIS — I10 Essential (primary) hypertension: Secondary | ICD-10-CM

## 2019-11-22 DIAGNOSIS — J42 Unspecified chronic bronchitis: Secondary | ICD-10-CM

## 2019-11-22 DIAGNOSIS — K069 Disorder of gingiva and edentulous alveolar ridge, unspecified: Secondary | ICD-10-CM

## 2019-11-22 DIAGNOSIS — I25111 Atherosclerotic heart disease of native coronary artery with angina pectoris with documented spasm: Secondary | ICD-10-CM

## 2019-11-22 MED ORDER — TRAZODONE HCL 50 MG PO TABS: ORAL_TABLET | ORAL | 3 refills | Status: DC

## 2019-11-22 MED ORDER — PROMETHAZINE-CODEINE 6.25-10 MG/5ML PO SOLN: 5.0000 mL | Freq: Four times a day (QID) | ORAL | 1 refills | Status: DC | PRN

## 2019-11-22 NOTE — Assessment & Plan Note (Signed)
Prom-cod syr prn   Potential benefits of a long term opioids use as well as potential risks (i.e. addiction risk, apnea etc) and complications (i.e. Somnolence, constipation and others) were explained to the patient and were aknowledged.

## 2019-11-22 NOTE — Assessment & Plan Note (Signed)
Discussed - chronic due to Phenytoin Avoid sweets

## 2019-11-22 NOTE — Assessment & Plan Note (Signed)
Vit D 

## 2019-11-22 NOTE — Progress Notes (Signed)
Subjective:  Patient ID: Veronica [name], female    DOB: Jan 05, 1951  Age: 69 y.o. MRN: [id number]  CC: No chief complaint on file.   HPI Veronica [name] presents for chronic cough, Vit D def, dyslipidemia C/o teeth breaking off  Outpatient Medications Prior to Visit  Medication Sig Dispense Refill  . acetaminophen-codeine (TYLENOL #3) 300-30 MG tablet Take 1 tablet by mouth every 4 (four) hours as needed. 60 tablet 3  . albuterol (PROVENTIL) (2.5 MG/3ML) 0.083% nebulizer solution USE 1 VIAL IN NEBULIZER 2 TIMES DAILY. Generic: VENTOLIN 60 vial 5  . albuterol (VENTOLIN HFA) 108 (90 Base) MCG/ACT inhaler INHALE 1 TO 2 PUFFS INTO THE LUNGS EVERY 4 HOURS AS NEEDED FOR WHEEZING OR SHORTNESS OF BREATH 8.5 g 3  . aspirin 81 MG chewable tablet Chew by mouth daily.    [name] atorvastatin (LIPITOR) 40 MG tablet TAKE 1 TABLET(40 MG) BY MOUTH DAILY 90 tablet 3  . budesonide (PULMICORT) 0.25 MG/2ML nebulizer solution USE 1 VIAL IN NEBULIZER 2 TIMES DAILY. Generic: PULMICORT 120 mL 2  . calcium carbonate (OSCAL) 1500 (600 Ca) MG TABS tablet Take 600 mg of elemental calcium by mouth 2 (two) times daily with a meal.     . cariprazine (VRAYLAR) capsule Take 1 capsule (1.5 mg total) by mouth daily. 90 capsule 3  . cetirizine (ZYRTEC) 5 MG tablet Take 1 tablet (5 mg total) by mouth daily. 30 tablet 1  . Cholecalciferol (VITAMIN D3) 50 MCG (2000 UT) capsule Take 1 capsule (2,000 Units total) by mouth daily. 100 capsule 3  . clonazePAM (KLONOPIN) 1 MG tablet TAKE 2 TABLETS BY MOUTH EVERY NIGHT AT BEDTIME AND 1/2 TABLET EXTRA IN DAYTIME AS NEEDED 225 tablet 1  . cyanocobalamin (,VITAMIN B-12,) 1000 MCG/ML injection Inject 1 mL (1,000 mcg total) into the muscle every 14 (fourteen) days. 10 mL 5  . Cyanocobalamin (VITAMIN B-12 PO) Take 5,000 mcg by mouth daily.    . diphenoxylate-atropine (LOMOTIL) 2.5-0.025 MG tablet Take 1-2 tablets by mouth 4 (four) times daily as needed for diarrhea or loose stools. 30 tablet 1    . Docusate Calcium (STOOL SOFTENER PO) Take 100 mg by mouth as needed (for constipation).     [name] escitalopram (LEXAPRO) 20 MG tablet TAKE 1 TABLET BY MOUTH DAILY 90 tablet 3  . formoterol (PERFOROMIST) 20 MCG/2ML nebulizer solution Take 2 mLs (20 mcg total) by nebulization 2 (two) times daily. 120 mL 3  . furosemide (LASIX) 20 MG tablet TAKE 1 TABLET(20 MG) BY MOUTH DAILY AS NEEDED 90 tablet 3  . ketoconazole (NIZORAL) 2 % cream Apply 1 application topically 2 (two) times daily. On rash 90 g 1  . ketoconazole (NIZORAL) 200 MG tablet Take 1 tablet (200 mg total) by mouth daily. 10 tablet 0  . levothyroxine (SYNTHROID) 88 MCG tablet TAKE 1 TABLET(88 MCG) BY MOUTH DAILY 90 tablet 3  . nitroGLYCERIN (NITROSTAT) 0.4 MG SL tablet USE AS DIRECTED SUBLINGUALLY 25 tablet 3  . Omega-3 Fatty Acids (FISH OIL) 1000 MG CAPS Take 1 capsule by mouth daily.    . phenytoin (DILANTIN) 100 MG ER capsule TAKE 2 CAPSULES BY MOUTH EVERY MORNING AND 1 CAPSULE EVERY EVENING 270 capsule 3  . PREVIDENT 5000 BOOSTER PLUS 1.1 % PSTE BRUSH BY MOUTH TWICE DAILY    . promethazine (PHENERGAN) 12.5 MG tablet 12.5 mg every 6 (six) hours as needed.     . Promethazine-Codeine 6.25-10 MG/5ML SOLN Take 5 mLs by mouth every 6 (six)  hours as needed. 300 mL 1  . Respiratory Therapy Supplies (FLUTTER) DEVI Use after nebulization treatments 1 each 0  . sodium chloride (MURO 128) 5 % ophthalmic solution Place 1 drop into both eyes as needed for irritation.     . SYRINGE-NEEDLE, DISP, 3 ML (BD ECLIPSE SYRINGE) 25G X 1" 3 ML MISC Use sq q 2 wks 50 each 3  . verapamil (CALAN-SR) 240 MG CR tablet TAKE 1 TABLET(240 MG) BY MOUTH AT BEDTIME 90 tablet 3  . traZODone (DESYREL) 50 MG tablet TAKE 4 TABLETS(200 MG) BY MOUTH AT BEDTIME 360 tablet 1   No facility-administered medications prior to visit.    ROS: Review of Systems  Constitutional: Positive for fatigue. Negative for activity change, appetite change, chills and unexpected weight change.   HENT: Negative for congestion, mouth sores and sinus pressure.   Eyes: Negative for visual disturbance.  Respiratory: Positive for cough. Negative for chest tightness.   Cardiovascular: Negative for leg swelling.  Gastrointestinal: Negative for abdominal pain and nausea.  Genitourinary: Negative for difficulty urinating, frequency and vaginal pain.  Musculoskeletal: Positive for arthralgias and back pain. Negative for gait problem.  Skin: Negative for pallor and rash.  Neurological: Negative for dizziness, tremors, weakness, numbness and headaches.  Psychiatric/Behavioral: Negative for confusion and sleep disturbance.    Objective:  BP 138/78 (BP Location: Right Arm, Patient Position: Sitting, Cuff Size: Normal)   Pulse 70   Temp 98.4 F (36.9 C) (Oral)   Ht 5' (1.524 m)   Wt 150 lb (68 kg)   SpO2 97%   BMI 29.29 kg/m   BP Readings from Last 3 Encounters:  11/22/19 138/78  09/05/19 (!) 144/78  08/23/19 (!) 158/78    Wt Readings from Last 3 Encounters:  11/22/19 150 lb (68 kg)  09/05/19 154 lb (69.9 kg)  08/23/19 158 lb (71.7 kg)    Physical Exam Constitutional:      General: She is not in acute distress.    Appearance: She is well-developed. She is obese.  HENT:     Head: Normocephalic.     Right Ear: External ear normal.     Left Ear: External ear normal.     Nose: Nose normal.  Eyes:     General:        Right eye: No discharge.        Left eye: No discharge.     Conjunctiva/sclera: Conjunctivae normal.     Pupils: Pupils are equal, round, and reactive to light.  Neck:     Thyroid: No thyromegaly.     Vascular: No JVD.     Trachea: No tracheal deviation.  Cardiovascular:     Rate and Rhythm: Normal rate and regular rhythm.     Heart sounds: Normal heart sounds.  Pulmonary:     Effort: No respiratory distress.     Breath sounds: No stridor. No wheezing.  Abdominal:     General: Bowel sounds are normal. There is no distension.     Palpations: Abdomen is  soft. There is no mass.     Tenderness: There is no abdominal tenderness. There is no guarding or rebound.  Musculoskeletal:        General: No tenderness.     Cervical back: Normal range of motion and neck supple.  Lymphadenopathy:     Cervical: No cervical adenopathy.  Skin:    Findings: No erythema or rash.  Neurological:     Mental Status: She is oriented to person, place,  and time.     Cranial Nerves: No cranial nerve deficit.     Motor: No abnormal muscle tone.     Coordination: Coordination abnormal.     Gait: Gait abnormal.     Deep Tendon Reflexes: Reflexes normal.  Psychiatric:        Behavior: Behavior normal.        Thought Content: Thought content normal.        Judgment: Judgment normal.    Gingivitis   Lab Results  Component Value Date   WBC 10.8 (H) 08/23/2019   HGB 12.4 08/23/2019   HCT 36.9 08/23/2019   PLT 279.0 08/23/2019   GLUCOSE 74 08/23/2019   CHOL 249 (H) 11/10/2018   TRIG 88.0 11/10/2018   HDL 57.70 11/10/2018   LDLDIRECT 165.0 09/26/2009   LDLCALC 174 (H) 11/10/2018   ALT 12 08/23/2019   AST 19 08/23/2019   NA 137 08/23/2019   K 3.6 08/23/2019   CL 97 08/23/2019   CREATININE 0.70 08/23/2019   BUN 5 (L) 08/23/2019   CO2 33 (H) 08/23/2019   TSH 2.22 11/10/2018   INR 1.0 ratio 11/07/2009   HGBA1C 5.6 08/23/2019    MR 3D Recon At Scanner  Result Date: 02/12/2019 CLINICAL DATA:  Right upper quadrant abdominal pain. Suspected cholecystitis. EXAM: MRI ABDOMEN WITHOUT AND WITH CONTRAST (INCLUDING MRCP) TECHNIQUE: Multiplanar multisequence MR imaging of the abdomen was performed both before and after the administration of intravenous contrast. Heavily T2-weighted images of the biliary and pancreatic ducts were obtained, and three-dimensional MRCP images were rendered by post processing. CONTRAST:  57mL GADAVIST GADOBUTROL 1 MMOL/ML IV SOLN COMPARISON:  Abdominal ultrasound 08/07/2015. Abdominal CT 08/31/2010. FINDINGS: Despite efforts by the  technologist and patient, mild motion artifact is present on today's exam and could not be eliminated. This reduces exam sensitivity and specificity. Lower chest: There are no significant findings within the visualized lower chest. Hepatobiliary: Small hepatic cysts are noted. There are new suspicious hepatic findings. The gallbladder is surgically absent. There is mild fusiform extrahepatic biliary dilatation with the common hepatic duct measuring up to 8 mm in diameter. The duct tapers distally and appears similar to previous CT. No evidence of choledocholithiasis or intrahepatic biliary dilatation. Pancreas: Unremarkable. No pancreatic ductal dilatation or surrounding inflammatory changes. Stable prominent fat between the pancreatic head and the superior mesenteric vein. Spleen: Normal in size without focal abnormality. Adrenals/Urinary Tract: Both adrenal glands appear normal. Small right renal cyst. No evidence of renal mass or hydronephrosis. Stomach/Bowel: No evidence of bowel wall thickening, distention or surrounding inflammatory change. Vascular/Lymphatic: There are no enlarged abdominal lymph nodes. Aortic and branch vessel atherosclerosis. No acute vascular findings identified. Other: The visualized anterior abdominal wall appears intact. No ascites. Musculoskeletal: No acute or significant osseous findings. Facet hypertrophy and associated synovial enhancement noted within the mid lumbar spine. IMPRESSION: 1. No acute findings or explanation for the patient's symptoms. 2. Mild fusiform extrahepatic biliary dilatation status post cholecystectomy, similar to previous CT. No evidence of choledocholithiasis or intrahepatic biliary dilatation. 3.  Aortic Atherosclerosis (ICD10-I70.0). Electronically Signed   By: Richardean Sale M.D.   On: 02/12/2019 13:07   MR ABD/MRCP  Result Date: 02/12/2019 CLINICAL DATA:  Right upper quadrant abdominal pain. Suspected cholecystitis. EXAM: MRI ABDOMEN WITHOUT AND  WITH CONTRAST (INCLUDING MRCP) TECHNIQUE: Multiplanar multisequence MR imaging of the abdomen was performed both before and after the administration of intravenous contrast. Heavily T2-weighted images of the biliary and pancreatic ducts were obtained, and three-dimensional MRCP  images were rendered by post processing. CONTRAST:  62mL GADAVIST GADOBUTROL 1 MMOL/ML IV SOLN COMPARISON:  Abdominal ultrasound 08/07/2015. Abdominal CT 08/31/2010. FINDINGS: Despite efforts by the technologist and patient, mild motion artifact is present on today's exam and could not be eliminated. This reduces exam sensitivity and specificity. Lower chest: There are no significant findings within the visualized lower chest. Hepatobiliary: Small hepatic cysts are noted. There are new suspicious hepatic findings. The gallbladder is surgically absent. There is mild fusiform extrahepatic biliary dilatation with the common hepatic duct measuring up to 8 mm in diameter. The duct tapers distally and appears similar to previous CT. No evidence of choledocholithiasis or intrahepatic biliary dilatation. Pancreas: Unremarkable. No pancreatic ductal dilatation or surrounding inflammatory changes. Stable prominent fat between the pancreatic head and the superior mesenteric vein. Spleen: Normal in size without focal abnormality. Adrenals/Urinary Tract: Both adrenal glands appear normal. Small right renal cyst. No evidence of renal mass or hydronephrosis. Stomach/Bowel: No evidence of bowel wall thickening, distention or surrounding inflammatory change. Vascular/Lymphatic: There are no enlarged abdominal lymph nodes. Aortic and branch vessel atherosclerosis. No acute vascular findings identified. Other: The visualized anterior abdominal wall appears intact. No ascites. Musculoskeletal: No acute or significant osseous findings. Facet hypertrophy and associated synovial enhancement noted within the mid lumbar spine. IMPRESSION: 1. No acute findings or  explanation for the patient's symptoms. 2. Mild fusiform extrahepatic biliary dilatation status post cholecystectomy, similar to previous CT. No evidence of choledocholithiasis or intrahepatic biliary dilatation. 3.  Aortic Atherosclerosis (ICD10-I70.0). Electronically Signed   By: Richardean Sale M.D.   On: 02/12/2019 13:07    Assessment & Plan:   There are no diagnoses linked to this encounter.   Meds ordered this encounter  Medications  . traZODone (DESYREL) 50 MG tablet    Sig: TAKE 4 TABLETS(200 MG) BY MOUTH AT BEDTIME    Dispense:  360 tablet    Refill:  3     Follow-up: No follow-ups on file.  Walker Kehr, MD

## 2019-11-22 NOTE — Assessment & Plan Note (Signed)
On B12 

## 2019-11-22 NOTE — Assessment & Plan Note (Signed)
Calan SR BP Readings from Last 3 Encounters:  11/22/19 138/78  09/05/19 (!) 144/78  08/23/19 (!) 158/78

## 2019-11-26 ENCOUNTER — Ambulatory Visit (INDEPENDENT_AMBULATORY_CARE_PROVIDER_SITE_OTHER): Payer: Medicare Other | Admitting: Psychology

## 2019-11-26 DIAGNOSIS — F331 Major depressive disorder, recurrent, moderate: Secondary | ICD-10-CM

## 2019-11-30 ENCOUNTER — Other Ambulatory Visit: Payer: Self-pay

## 2019-11-30 ENCOUNTER — Ambulatory Visit (INDEPENDENT_AMBULATORY_CARE_PROVIDER_SITE_OTHER): Payer: Medicare Other | Admitting: Critical Care Medicine

## 2019-11-30 ENCOUNTER — Encounter: Payer: Self-pay | Admitting: Critical Care Medicine

## 2019-11-30 VITALS — BP 122/70 | HR 74 | Temp 97.3°F | Ht 60.0 in | Wt 157.8 lb

## 2019-11-30 DIAGNOSIS — Z23 Encounter for immunization: Secondary | ICD-10-CM | POA: Diagnosis not present

## 2019-11-30 DIAGNOSIS — J479 Bronchiectasis, uncomplicated: Secondary | ICD-10-CM | POA: Diagnosis not present

## 2019-11-30 DIAGNOSIS — R600 Localized edema: Secondary | ICD-10-CM | POA: Diagnosis not present
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
## 2019-11-30 NOTE — Patient Instructions (Addendum)
Thank you for visiting Dr. Carlis Abbott at [facility]. We recommend the following:  Keep your nebulizers the same- use two times daily. Keep using your hypertonic saline nebulizers and flutter valve.  You can use your albuterol up to every 4 hours as needed. If you are needing it very frequently, we or Dr. Alain Marion need to see you to check things since this is a significant worsening.   Start taking your lasix every day until your weight goes back to normal. Please follow up with Dr. Alain Marion so he can help keep track of your fluid and your kidney function.    Return in about 3 months (around 02/29/2020). with Dr. Silas Flood (30 minute visit).    Please do your part to reduce the spread of COVID-19.

## 2019-12-03 ENCOUNTER — Ambulatory Visit (INDEPENDENT_AMBULATORY_CARE_PROVIDER_SITE_OTHER): Payer: Medicare Other | Admitting: Internal Medicine

## 2019-12-03 ENCOUNTER — Other Ambulatory Visit: Payer: Self-pay | Admitting: Internal Medicine

## 2019-12-03 ENCOUNTER — Other Ambulatory Visit: Payer: Self-pay

## 2019-12-03 ENCOUNTER — Encounter: Payer: Self-pay | Admitting: Internal Medicine

## 2019-12-03 ENCOUNTER — Ambulatory Visit (INDEPENDENT_AMBULATORY_CARE_PROVIDER_SITE_OTHER): Payer: Medicare Other

## 2019-12-03 VITALS — BP 180/90 | HR 75 | Temp 98.9°F | Ht 60.0 in | Wt 154.0 lb

## 2019-12-03 DIAGNOSIS — R0609 Other forms of dyspnea: Secondary | ICD-10-CM

## 2019-12-03 DIAGNOSIS — J42 Unspecified chronic bronchitis: Secondary | ICD-10-CM

## 2019-12-03 DIAGNOSIS — J40 Bronchitis, not specified as acute or chronic: Secondary | ICD-10-CM

## 2019-12-03 DIAGNOSIS — J479 Bronchiectasis, uncomplicated: Secondary | ICD-10-CM

## 2019-12-03 DIAGNOSIS — R0600 Dyspnea, unspecified: Secondary | ICD-10-CM

## 2019-12-03 DIAGNOSIS — I10 Essential (primary) hypertension: Secondary | ICD-10-CM | POA: Diagnosis not present

## 2019-12-03 DIAGNOSIS — I25111 Atherosclerotic heart disease of native coronary artery with angina pectoris with documented spasm: Secondary | ICD-10-CM

## 2019-12-03 DIAGNOSIS — R609 Edema, unspecified: Secondary | ICD-10-CM

## 2019-12-03 MED ORDER — LEVOFLOXACIN 500 MG PO TABS
500.0000 mg | ORAL_TABLET | Freq: Every day | ORAL | 0 refills | Status: DC
Start: 2019-12-03 — End: 2020-02-12

## 2019-12-03 MED ORDER — METHYLPREDNISOLONE ACETATE 80 MG/ML IJ SUSP
80.0000 mg | Freq: Once | INTRAMUSCULAR | Status: AC
  Administered 2019-12-03: 80 mg via INTRAMUSCULAR

## 2019-12-03 NOTE — Assessment & Plan Note (Signed)
Calan SR 

## 2019-12-03 NOTE — Assessment & Plan Note (Signed)
A flare up - Rx Levaquin

## 2019-12-03 NOTE — Addendum Note (Signed)
Addended by: Darlys Gales on: 12/03/2019 01:43 PM   Modules accepted: Orders

## 2019-12-03 NOTE — Assessment & Plan Note (Signed)
CXR

## 2019-12-03 NOTE — Assessment & Plan Note (Signed)
CMET Cont Furosemide, lost 3 lbs

## 2019-12-03 NOTE — Progress Notes (Signed)
Subjective:  Patient ID: Veronica [name], female    DOB: 07-15-1950  Age: 69 y.o. MRN: [id number]  CC: No chief complaint on file.   HPI Veronica [name] presents for c/o SOB, edema - worse over past wk. Royelle saw Dr Carlis Abbott on Friday. She was asked to use more Lasix, pt lost 3 lbs C/o DOE, cough w/green sputum...  Outpatient Medications Prior to Visit  Medication Sig Dispense Refill  . acetaminophen-codeine (TYLENOL #3) 300-30 MG tablet Take 1 tablet by mouth every 4 (four) hours as needed. 60 tablet 3  . albuterol (PROVENTIL) (2.5 MG/3ML) 0.083% nebulizer solution USE 1 VIAL IN NEBULIZER 2 TIMES DAILY. Generic: VENTOLIN 60 vial 5  . albuterol (VENTOLIN HFA) 108 (90 Base) MCG/ACT inhaler INHALE 1 TO 2 PUFFS INTO THE LUNGS EVERY 4 HOURS AS NEEDED FOR WHEEZING OR SHORTNESS OF BREATH 8.5 g 3  . aspirin 81 MG chewable tablet Chew by mouth daily.    [name] atorvastatin (LIPITOR) 40 MG tablet TAKE 1 TABLET(40 MG) BY MOUTH DAILY 90 tablet 3  . budesonide (PULMICORT) 0.25 MG/2ML nebulizer solution USE 1 VIAL IN NEBULIZER 2 TIMES DAILY. Generic: PULMICORT 120 mL 2  . calcium carbonate (OSCAL) 1500 (600 Ca) MG TABS tablet Take 600 mg of elemental calcium by mouth 2 (two) times daily with a meal.     . cariprazine (VRAYLAR) capsule Take 1 capsule (1.5 mg total) by mouth daily. 90 capsule 3  . cetirizine (ZYRTEC) 5 MG tablet Take 1 tablet (5 mg total) by mouth daily. 30 tablet 1  . Cholecalciferol (VITAMIN D3) 50 MCG (2000 UT) capsule Take 1 capsule (2,000 Units total) by mouth daily. 100 capsule 3  . clonazePAM (KLONOPIN) 1 MG tablet TAKE 2 TABLETS BY MOUTH EVERY NIGHT AT BEDTIME AND 1/2 TABLET EXTRA IN DAYTIME AS NEEDED 225 tablet 1  . cyanocobalamin (,VITAMIN B-12,) 1000 MCG/ML injection Inject 1 mL (1,000 mcg total) into the muscle every 14 (fourteen) days. 10 mL 5  . Cyanocobalamin (VITAMIN B-12 PO) Take 5,000 mcg by mouth daily.    . diphenoxylate-atropine (LOMOTIL) 2.5-0.025 MG tablet Take 1-2  tablets by mouth 4 (four) times daily as needed for diarrhea or loose stools. 30 tablet 1  . Docusate Calcium (STOOL SOFTENER PO) Take 100 mg by mouth as needed (for constipation).     [name] escitalopram (LEXAPRO) 20 MG tablet TAKE 1 TABLET BY MOUTH DAILY 90 tablet 3  . formoterol (PERFOROMIST) 20 MCG/2ML nebulizer solution Take 2 mLs (20 mcg total) by nebulization 2 (two) times daily. 120 mL 3  . furosemide (LASIX) 20 MG tablet TAKE 1 TABLET(20 MG) BY MOUTH DAILY AS NEEDED 90 tablet 3  . ketoconazole (NIZORAL) 2 % cream Apply 1 application topically 2 (two) times daily. On rash 90 g 1  . ketoconazole (NIZORAL) 200 MG tablet Take 1 tablet (200 mg total) by mouth daily. 10 tablet 0  . levothyroxine (SYNTHROID) 88 MCG tablet TAKE 1 TABLET(88 MCG) BY MOUTH DAILY 90 tablet 3  . nitroGLYCERIN (NITROSTAT) 0.4 MG SL tablet USE AS DIRECTED SUBLINGUALLY 25 tablet 3  . Omega-3 Fatty Acids (FISH OIL) 1000 MG CAPS Take 1 capsule by mouth daily.    . phenytoin (DILANTIN) 100 MG ER capsule TAKE 2 CAPSULES BY MOUTH EVERY MORNING AND 1 CAPSULE EVERY EVENING 270 capsule 3  . PREVIDENT 5000 BOOSTER PLUS 1.1 % PSTE BRUSH BY MOUTH TWICE DAILY    . promethazine (PHENERGAN) 12.5 MG tablet 12.5 mg every 6 (six) hours  as needed.     . Promethazine-Codeine 6.25-10 MG/5ML SOLN Take 5 mLs by mouth every 6 (six) hours as needed. 300 mL 1  . Respiratory Therapy Supplies (FLUTTER) DEVI Use after nebulization treatments 1 each 0  . sodium chloride (MURO 128) 5 % ophthalmic solution Place 1 drop into both eyes as needed for irritation.     . SYRINGE-NEEDLE, DISP, 3 ML (BD ECLIPSE SYRINGE) 25G X 1" 3 ML MISC Use sq q 2 wks 50 each 3  . traZODone (DESYREL) 50 MG tablet TAKE 4 TABLETS(200 MG) BY MOUTH AT BEDTIME 360 tablet 3  . verapamil (CALAN-SR) 240 MG CR tablet TAKE 1 TABLET(240 MG) BY MOUTH AT BEDTIME 90 tablet 3   No facility-administered medications prior to visit.    ROS: Review of Systems  Constitutional: Positive for  fatigue. Negative for activity change, appetite change, chills and unexpected weight change.  HENT: Negative for congestion, mouth sores and sinus pressure.   Eyes: Negative for visual disturbance.  Respiratory: Positive for cough and shortness of breath. Negative for chest tightness.   Cardiovascular: Positive for leg swelling. Negative for chest pain.  Gastrointestinal: Negative for abdominal pain and nausea.  Genitourinary: Negative for difficulty urinating, frequency and vaginal pain.  Musculoskeletal: Positive for arthralgias, back pain and gait problem.  Skin: Negative for pallor and rash.  Neurological: Negative for dizziness, tremors, weakness, numbness and headaches.  Psychiatric/Behavioral: Positive for dysphoric mood. Negative for confusion and sleep disturbance.    Objective:  BP (!) 180/90 (BP Location: Right Arm, Patient Position: Sitting, Cuff Size: Normal)   Pulse 75   Temp 98.9 F (37.2 C) (Oral)   Ht 5' (1.524 m)   Wt 154 lb (69.9 kg)   SpO2 98%   BMI 30.08 kg/m   BP Readings from Last 3 Encounters:  12/03/19 (!) 180/90  11/30/19 122/70  11/22/19 138/78    Wt Readings from Last 3 Encounters:  12/03/19 154 lb (69.9 kg)  11/30/19 157 lb 12.8 oz (71.6 kg)  11/22/19 150 lb (68 kg)    Physical Exam Constitutional:      General: She is not in acute distress.    Appearance: She is well-developed.  HENT:     Head: Normocephalic.     Right Ear: External ear normal.     Left Ear: External ear normal.     Nose: Nose normal.  Eyes:     General:        Right eye: No discharge.        Left eye: No discharge.     Conjunctiva/sclera: Conjunctivae normal.     Pupils: Pupils are equal, round, and reactive to light.  Neck:     Thyroid: No thyromegaly.     Vascular: No JVD.     Trachea: No tracheal deviation.  Cardiovascular:     Rate and Rhythm: Normal rate and regular rhythm.     Heart sounds: Normal heart sounds.  Pulmonary:     Effort: No respiratory  distress.     Breath sounds: No stridor. No wheezing.  Abdominal:     General: Bowel sounds are normal. There is no distension.     Palpations: Abdomen is soft. There is no mass.     Tenderness: There is no abdominal tenderness. There is no guarding or rebound.  Musculoskeletal:        General: Tenderness present.     Cervical back: Normal range of motion and neck supple.     Right lower  leg: Edema present.     Left lower leg: Edema present.  Lymphadenopathy:     Cervical: No cervical adenopathy.  Skin:    Findings: No erythema or rash.  Neurological:     Mental Status: She is oriented to person, place, and time.     Cranial Nerves: No cranial nerve deficit.     Motor: No abnormal muscle tone.     Coordination: Coordination normal.     Gait: Gait abnormal.     Deep Tendon Reflexes: Reflexes normal.  Psychiatric:        Behavior: Behavior normal.        Thought Content: Thought content normal.        Judgment: Judgment normal.    Dyspneic w/walking Trace edema B ankles Cane Gingivitis, no thrush  Lab Results  Component Value Date   WBC 10.8 (H) 08/23/2019   HGB 12.4 08/23/2019   HCT 36.9 08/23/2019   PLT 279.0 08/23/2019   GLUCOSE 74 08/23/2019   CHOL 249 (H) 11/10/2018   TRIG 88.0 11/10/2018   HDL 57.70 11/10/2018   LDLDIRECT 165.0 09/26/2009   LDLCALC 174 (H) 11/10/2018   ALT 12 08/23/2019   AST 19 08/23/2019   NA 137 08/23/2019   K 3.6 08/23/2019   CL 97 08/23/2019   CREATININE 0.70 08/23/2019   BUN 5 (L) 08/23/2019   CO2 33 (H) 08/23/2019   TSH 2.22 11/10/2018   INR 1.0 ratio 11/07/2009   HGBA1C 5.6 08/23/2019    MR 3D Recon At Scanner  Result Date: 02/12/2019 CLINICAL DATA:  Right upper quadrant abdominal pain. Suspected cholecystitis. EXAM: MRI ABDOMEN WITHOUT AND WITH CONTRAST (INCLUDING MRCP) TECHNIQUE: Multiplanar multisequence MR imaging of the abdomen was performed both before and after the administration of intravenous contrast. Heavily  T2-weighted images of the biliary and pancreatic ducts were obtained, and three-dimensional MRCP images were rendered by post processing. CONTRAST:  55mL GADAVIST GADOBUTROL 1 MMOL/ML IV SOLN COMPARISON:  Abdominal ultrasound 08/07/2015. Abdominal CT 08/31/2010. FINDINGS: Despite efforts by the technologist and patient, mild motion artifact is present on today's exam and could not be eliminated. This reduces exam sensitivity and specificity. Lower chest: There are no significant findings within the visualized lower chest. Hepatobiliary: Small hepatic cysts are noted. There are new suspicious hepatic findings. The gallbladder is surgically absent. There is mild fusiform extrahepatic biliary dilatation with the common hepatic duct measuring up to 8 mm in diameter. The duct tapers distally and appears similar to previous CT. No evidence of choledocholithiasis or intrahepatic biliary dilatation. Pancreas: Unremarkable. No pancreatic ductal dilatation or surrounding inflammatory changes. Stable prominent fat between the pancreatic head and the superior mesenteric vein. Spleen: Normal in size without focal abnormality. Adrenals/Urinary Tract: Both adrenal glands appear normal. Small right renal cyst. No evidence of renal mass or hydronephrosis. Stomach/Bowel: No evidence of bowel wall thickening, distention or surrounding inflammatory change. Vascular/Lymphatic: There are no enlarged abdominal lymph nodes. Aortic and branch vessel atherosclerosis. No acute vascular findings identified. Other: The visualized anterior abdominal wall appears intact. No ascites. Musculoskeletal: No acute or significant osseous findings. Facet hypertrophy and associated synovial enhancement noted within the mid lumbar spine. IMPRESSION: 1. No acute findings or explanation for the patient's symptoms. 2. Mild fusiform extrahepatic biliary dilatation status post cholecystectomy, similar to previous CT. No evidence of choledocholithiasis or  intrahepatic biliary dilatation. 3.  Aortic Atherosclerosis (ICD10-I70.0). Electronically Signed   By: Richardean Sale M.D.   On: 02/12/2019 13:07   MR ABD/MRCP  Result  Date: 02/12/2019 CLINICAL DATA:  Right upper quadrant abdominal pain. Suspected cholecystitis. EXAM: MRI ABDOMEN WITHOUT AND WITH CONTRAST (INCLUDING MRCP) TECHNIQUE: Multiplanar multisequence MR imaging of the abdomen was performed both before and after the administration of intravenous contrast. Heavily T2-weighted images of the biliary and pancreatic ducts were obtained, and three-dimensional MRCP images were rendered by post processing. CONTRAST:  54mL GADAVIST GADOBUTROL 1 MMOL/ML IV SOLN COMPARISON:  Abdominal ultrasound 08/07/2015. Abdominal CT 08/31/2010. FINDINGS: Despite efforts by the technologist and patient, mild motion artifact is present on today's exam and could not be eliminated. This reduces exam sensitivity and specificity. Lower chest: There are no significant findings within the visualized lower chest. Hepatobiliary: Small hepatic cysts are noted. There are new suspicious hepatic findings. The gallbladder is surgically absent. There is mild fusiform extrahepatic biliary dilatation with the common hepatic duct measuring up to 8 mm in diameter. The duct tapers distally and appears similar to previous CT. No evidence of choledocholithiasis or intrahepatic biliary dilatation. Pancreas: Unremarkable. No pancreatic ductal dilatation or surrounding inflammatory changes. Stable prominent fat between the pancreatic head and the superior mesenteric vein. Spleen: Normal in size without focal abnormality. Adrenals/Urinary Tract: Both adrenal glands appear normal. Small right renal cyst. No evidence of renal mass or hydronephrosis. Stomach/Bowel: No evidence of bowel wall thickening, distention or surrounding inflammatory change. Vascular/Lymphatic: There are no enlarged abdominal lymph nodes. Aortic and branch vessel atherosclerosis. No  acute vascular findings identified. Other: The visualized anterior abdominal wall appears intact. No ascites. Musculoskeletal: No acute or significant osseous findings. Facet hypertrophy and associated synovial enhancement noted within the mid lumbar spine. IMPRESSION: 1. No acute findings or explanation for the patient's symptoms. 2. Mild fusiform extrahepatic biliary dilatation status post cholecystectomy, similar to previous CT. No evidence of choledocholithiasis or intrahepatic biliary dilatation. 3.  Aortic Atherosclerosis (ICD10-I70.0). Electronically Signed   By: Richardean Sale M.D.   On: 02/12/2019 13:07    Assessment & Plan:    Walker Kehr, MD

## 2019-12-03 NOTE — Assessment & Plan Note (Signed)
Depo-Medrol IM 80 mg Can't take po

## 2019-12-04 ENCOUNTER — Other Ambulatory Visit: Payer: Self-pay | Admitting: *Deleted

## 2019-12-04 ENCOUNTER — Other Ambulatory Visit: Payer: Self-pay | Admitting: Internal Medicine

## 2019-12-04 DIAGNOSIS — E559 Vitamin D deficiency, unspecified: Secondary | ICD-10-CM

## 2019-12-04 DIAGNOSIS — L509 Urticaria, unspecified: Secondary | ICD-10-CM

## 2019-12-04 DIAGNOSIS — E538 Deficiency of other specified B group vitamins: Secondary | ICD-10-CM

## 2019-12-04 DIAGNOSIS — E875 Hyperkalemia: Secondary | ICD-10-CM

## 2019-12-04 LAB — COMPLETE METABOLIC PANEL WITH GFR
AG Ratio: 1.4 (calc) (ref 1.0–2.5)
ALT: 13 U/L (ref 6–29)
AST: 22 U/L (ref 10–35)
Albumin: 3.9 g/dL (ref 3.6–5.1)
Alkaline phosphatase (APISO): 135 U/L (ref 37–153)
BUN/Creatinine Ratio: 10 (calc) (ref 6–22)
BUN: 6 mg/dL — ABNORMAL LOW (ref 7–25)
CO2: 30 mmol/L (ref 20–32)
Calcium: 7.7 mg/dL — ABNORMAL LOW (ref 8.6–10.4)
Chloride: 101 mmol/L (ref 98–110)
Creat: 0.61 mg/dL (ref 0.60–0.93)
GFR, Est African American: 106 mL/min/{1.73_m2} (ref >=60)
GFR, Est Non African American: 92 mL/min/{1.73_m2} (ref >=60)
Globulin: 2.7 g/dL (calc) (ref 1.9–3.7)
Glucose, Bld: 70 mg/dL (ref 65–99)
Potassium: 4.1 mmol/L (ref 3.5–5.3)
Sodium: 142 mmol/L (ref 135–146)
Total Bilirubin: 0.3 mg/dL (ref 0.2–1.2)
Total Protein: 6.6 g/dL (ref 6.1–8.1)

## 2019-12-04 LAB — BRAIN NATRIURETIC PEPTIDE: Brain Natriuretic Peptide: 58 pg/mL (ref <100)

## 2019-12-04 NOTE — Patient Outreach (Signed)
[facility]) Care Management  12/04/2019  KAROLINA ZAMOR Dec 02, 1950 [id number]  Successful telephone outreach call to patient. HIPAA identifiers obtained. Called patient back in response to a VM she left nurse. Patient states that she has not been feeling well. She has been experiencing SOB with exertion and BLE edema. She had a pulmonology visit on 11/30/19 to follow-up. Dr. Carlis Abbott told her to increase furosemide.  Patient had a PCP on  12/03/19 and per patient she lost 3 pounds from increasing furosemide. Patient reports that her PCP did a chest x-ray, placed her on antibiotics, took labs, and told her to continue with the furosemide increase dose which is 20 mg daily instead of as needed. She is waiting to hear results back from her testing. Patient reports that she does not feel up to having today's visit with this nurse and asked if she could reschedule her appointment for a later date.   Plan: RN Health Coach will call patient within the month of October and patient agrees to future outreach calls.   Emelia Loron RN, BSN [place] [phone] [email or web address]

## 2019-12-08 ENCOUNTER — Other Ambulatory Visit: Payer: Self-pay | Admitting: Internal Medicine

## 2019-12-10 ENCOUNTER — Ambulatory Visit (INDEPENDENT_AMBULATORY_CARE_PROVIDER_SITE_OTHER): Payer: Medicare Other | Admitting: Psychology

## 2019-12-10 DIAGNOSIS — F331 Major depressive disorder, recurrent, moderate: Secondary | ICD-10-CM

## 2019-12-16 ENCOUNTER — Other Ambulatory Visit: Payer: Self-pay | Admitting: Internal Medicine

## 2019-12-18 ENCOUNTER — Other Ambulatory Visit: Payer: Self-pay | Admitting: *Deleted

## 2019-12-18 NOTE — Patient Outreach (Signed)
[facility]) Care Management  [place]  12/18/2019   Veronica [name] [birth year] [id number]  Subjective: Successful telephone outreach call to patient. HIPAA identifiers obtained. Patient explained that she is feeling better after experiencing a COPD exacerbation. She completed the prescribed antibiotic and reports that she continues to be more SOB than usual but overall feels better. Patient reports using her rescue inhaler 2-4 times daily and explains that she has stopped coughing up green sputum.  She is scheduled to follow-up with her PCP on 01/04/20 and states she will contact him and/or pulmonologist before then if her symptoms worsen.  Patient reports that her home environment is safe, she denies having any falls but states she will discuss with her PCP during her upcoming visit ordering a cane with a wide base for better stability if she were to become unsteady. She continues to have counseling with LCSW Terri Bauert every 2 weeks and states she is currently at a good baseline emotionally. Patient continues with her goals of getting out of the house at least once daily to boost her moral, staying as active as tolerated by completing small house-hold tasks and then resting, and by eating healthier in an effort to try to lose weight to improve her overall health. Patient did not have any further questions or concerns and did confirm that she has this nurse's contact number to call if needed.    Encounter Medications:  Outpatient Encounter Medications as of 12/18/2019  Medication Sig Note   acetaminophen-codeine (TYLENOL #3) 300-30 MG tablet Take 1 tablet by mouth every 4 (four) hours as needed. 07/10/2019: Used 3 times in the past week.  Indication migraines   albuterol (PROVENTIL) (2.5 MG/3ML) 0.083% nebulizer solution USE 1 VIAL IN NEBULIZER 2 TIMES DAILY. Generic: VENTOLIN 07/10/2019: Uses 1 vial two times daily.   albuterol (VENTOLIN HFA) 108 (90 Base) MCG/ACT  inhaler INHALE 1 TO 2 PUFFS INTO THE LUNGS EVERY 4 HOURS AS NEEDED FOR WHEEZING OR SHORTNESS OF BREATH    aspirin 81 MG chewable tablet Chew by mouth daily.    atorvastatin (LIPITOR) 40 MG tablet TAKE 1 TABLET(40 MG) BY MOUTH DAILY    B-D INSULIN SYRINGE 1CC/25GX1" 25G X 1" 1 ML MISC USE AS DIRECTED EVERY 2 WEEKS    budesonide (PULMICORT) 0.25 MG/2ML nebulizer solution USE 1 VIAL IN NEBULIZER 2 TIMES DAILY. Generic: PULMICORT    calcium carbonate (OSCAL) 1500 (600 Ca) MG TABS tablet Take 600 mg of elemental calcium by mouth 2 (two) times daily with a meal.     cariprazine (VRAYLAR) capsule Take 1 capsule (1.5 mg total) by mouth daily.    cetirizine (ZYRTEC) 5 MG tablet Take 1 tablet (5 mg total) by mouth daily. 03/23/2019: PRN   Cholecalciferol (VITAMIN D3) 50 MCG (2000 UT) capsule Take 1 capsule (2,000 Units total) by mouth daily.    clonazePAM (KLONOPIN) 1 MG tablet TAKE 2 TABLETS BY MOUTH EVERY NIGHT AT BEDTIME AND 1/2 TABLET EXTRA IN DAYTIME AS NEEDED    cyanocobalamin (,VITAMIN B-12,) 1000 MCG/ML injection INJECT 1ML IN THE MUSCLE EVERY 14 DAYS.    Cyanocobalamin (VITAMIN B-12 PO) Take 5,000 mcg by mouth daily.    diphenoxylate-atropine (LOMOTIL) 2.5-0.025 MG tablet Take 1-2 tablets by mouth 4 (four) times daily as needed for diarrhea or loose stools. 07/10/2019: Last used 2 weeks ago.   Docusate Calcium (STOOL SOFTENER PO) Take 100 mg by mouth as needed (for constipation).  07/10/2019: Last used 2 weeks ago.  escitalopram (LEXAPRO) 20 MG tablet TAKE 1 TABLET BY MOUTH DAILY    formoterol (PERFOROMIST) 20 MCG/2ML nebulizer solution Take 2 mLs (20 mcg total) by nebulization 2 (two) times daily.    furosemide (LASIX) 20 MG tablet TAKE 1 TABLET(20 MG) BY MOUTH DAILY AS NEEDED    ketoconazole (NIZORAL) 2 % cream Apply 1 application topically 2 (two) times daily. On rash    ketoconazole (NIZORAL) 200 MG tablet Take 1 tablet (200 mg total) by mouth daily.    levofloxacin (LEVAQUIN)  500 MG tablet Take 1 tablet (500 mg total) by mouth daily. (Patient not taking: Reported on 12/18/2019) 12/18/2019: completed   levothyroxine (SYNTHROID) 88 MCG tablet TAKE 1 TABLET(88 MCG) BY MOUTH DAILY    nitroGLYCERIN (NITROSTAT) 0.4 MG SL tablet USE AS DIRECTED SUBLINGUALLY    Omega-3 Fatty Acids (FISH OIL) 1000 MG CAPS Take 1 capsule by mouth daily.    phenytoin (DILANTIN) 100 MG ER capsule TAKE 2 CAPSULES BY MOUTH EVERY MORNING AND 1 CAPSULE EVERY EVENING    PREVIDENT 5000 BOOSTER PLUS 1.1 % PSTE BRUSH BY MOUTH TWICE DAILY    promethazine (PHENERGAN) 12.5 MG tablet 12.5 mg every 6 (six) hours as needed.  07/10/2019: Using as needed for nausea associated with migraines.  Last used 3 weeks ago.   Promethazine-Codeine 6.25-10 MG/5ML SOLN Take 5 mLs by mouth every 6 (six) hours as needed.    Respiratory Therapy Supplies (FLUTTER) DEVI Use after nebulization treatments    sodium chloride (MURO 128) 5 % ophthalmic solution Place 1 drop into both eyes as needed for irritation.  07/10/2019: She is using daily.   SYRINGE-NEEDLE, DISP, 3 ML (BD ECLIPSE SYRINGE) 25G X 1" 3 ML MISC Use sq q 2 wks    traZODone (DESYREL) 50 MG tablet TAKE 4 TABLETS(200 MG) BY MOUTH AT BEDTIME    verapamil (CALAN-SR) 240 MG CR tablet TAKE 1 TABLET(240 MG) BY MOUTH AT BEDTIME    No facility-administered encounter medications on file as of 12/18/2019.    Functional Status:  In your present state of health, do you have any difficulty performing the following activities: 05/08/2019 02/27/2019  Hearing? N N  Vision? N N  Difficulty concentrating or making decisions? N N  Walking or climbing stairs? N N  Dressing or bathing? N N  Doing errands, shopping? N N  Preparing Food and eating ? N N  Using the Toilet? N N  In the past six months, have you accidently leaked urine? N N  Do you have problems with loss of bowel control? N N  Managing your Medications? N N  Managing your Finances? N N  Housekeeping or  managing your Housekeeping? N N  Some recent data might be hidden    Fall/Depression Screening: Fall Risk  12/18/2019 10/08/2019 09/06/2019  Falls in the past year? 1 1 1   Comment - - -  Number falls in past yr: 0 0 1  Comment - - -  Injury with Fall? 0 0 0  Risk for fall due to : - - -  Follow up Falls prevention discussed;Education provided;Falls evaluation completed Falls prevention discussed;Education provided;Falls evaluation completed Falls prevention discussed;Education provided;Falls evaluation completed   PHQ 2/9 Scores 11/22/2019 05/08/2019 02/27/2019 02/21/2018 03/04/2017 02/18/2017 02/11/2017  PHQ - 2 Score 0 4 4 4 1 6 6   PHQ- 9 Score 0 7 8 9  - 13 9    Assessment:  Goals Addressed   None    Goals Addressed  This Visit's Progress    Eat Healthy       Follow Up Date 03/13/20    - set goal weight - drink 6 to 8 glasses of water each day - manage portion size - prepare main meal at home 3 to 5 days each week - read food labels for fat, fiber, carbohydrates and portion size - set a realistic goal    Why is this important?   When you are ready to manage your nutrition or weight, having a plan and setting goals will help.  Taking small steps to change how you eat and exercise is a good place to start.    Notes: Patient monitors sodium content in foods     Learn and Do Breathing Exercises       Follow Up Date 03/13/20    - do breathing exercises every day - do exercises in a comfortable position that makes breathing as easy as possible    Why is this important?   Breathing exercises can help lessen the cough that comes with chronic obstructive pulmonary disease.  Doing the exercises will give you more energy.  They will also help you to control your symptoms.    Notes: Patient continues to rest and do deep breathing and uses her flutter device twice daily     Manage Fatigue (Tiredness)       Follow Up Date 03/13/20    - eat healthy - get at  least 7 to 8 hours of sleep at night - get outdoors every day (weather permitting) - keep room cool and dark - practice relaxation or meditation daily - use devices that will help like a cane, sock-puller or reacher    Why is this important?   Feeling tired or worn out is a common symptom of COPD (chronic obstructive pulmonary disease).  Learning when you feel your best and when you need rest is important.  Managing the tiredness (fatigue) will help you be active and enjoy life.     Notes:      COMPLETED: Patient Stated       Be more socially active, I will go to [facility] and do something I enjoy and continue to watch [name] and/or start to visit churches      COMPLETED: Patient Stated       Make myself feel better by letting go of the past, help others as much as possible, write down 3 things I am thankful for daily, continue to go to church. Get up daily and go some place and perhaps look into the [facility] for activities. Go to Christmas program at [name].     COMPLETED: Patient Stated       Check into doing a cake decorating class. Work harder on my relationship with my brother and try to let go of more of the past. Continue to write down 3 things that I am thankful for everyday.        Track and Manage My Symptoms       Follow Up Date 03/13/20    - develop a rescue plan - eliminate symptom triggers at home - follow rescue plan if symptoms flare-up - keep follow-up appointments - use an extra pillow to sleep    Why is this important?   Tracking your symptoms and other information about your health helps your doctor plan your care.  Write down the symptoms, the time of day, what you were doing and what medicine you are  taking.  You will soon learn how to manage your symptoms.     Notes:      Track and Manage My Triggers       Follow Up Date 03/13/20    - avoid second hand smoke - eliminate smoking in my home - identify and remove indoor air  pollutants - limit outdoor activity during cold weather - listen for public air quality announcements every day    Why is this important?   Triggers are activities or things, like tobacco smoke or cold weather, that make your COPD (chronic obstructive pulmonary disease) flare-up.  Knowing these triggers helps you plan how to stay away from them.  When you cannot remove them, you can learn how to manage them.     Notes:       Plan: RN Health Coach will send PCP today's assessment note, will send patient some [employer] coupons, will call patient within the month of November, and patient agrees to future outreach calls.   Emelia Loron RN, BSN [place] [phone] [email or web address]

## 2019-12-25 ENCOUNTER — Ambulatory Visit (INDEPENDENT_AMBULATORY_CARE_PROVIDER_SITE_OTHER): Payer: Medicare Other | Admitting: Psychology

## 2019-12-25 DIAGNOSIS — F331 Major depressive disorder, recurrent, moderate: Secondary | ICD-10-CM

## 2019-12-31 ENCOUNTER — Other Ambulatory Visit: Payer: Self-pay | Admitting: Internal Medicine

## 2019-12-31 DIAGNOSIS — E559 Vitamin D deficiency, unspecified: Secondary | ICD-10-CM

## 2019-12-31 DIAGNOSIS — J449 Chronic obstructive pulmonary disease, unspecified: Secondary | ICD-10-CM

## 2019-12-31 DIAGNOSIS — L509 Urticaria, unspecified: Secondary | ICD-10-CM

## 2019-12-31 DIAGNOSIS — E875 Hyperkalemia: Secondary | ICD-10-CM

## 2019-12-31 DIAGNOSIS — E539 Vitamin B deficiency, unspecified: Secondary | ICD-10-CM

## 2019-12-31 NOTE — Telephone Encounter (Signed)
Check [place] registry last filled Tylenol # 3 11/11/2019. Pls advise in absence of PCP...Johny Chess

## 2020-01-01 ENCOUNTER — Telehealth: Payer: Self-pay | Admitting: Internal Medicine

## 2020-01-01 NOTE — Telephone Encounter (Signed)
Pharmacy was notified that the patient is already aware that both questioned prescriptions should not be used together.

## 2020-01-01 NOTE — Telephone Encounter (Signed)
Correct. She should not be using them together. She is aware. Thanks

## 2020-01-01 NOTE — Telephone Encounter (Signed)
    Pharmacy calling to clarify of patient should be taking both Promethazine-Codeine 6.25-10 MG/5ML SOLN and Tylenol #3.  Possible drug interaction

## 2020-01-04 ENCOUNTER — Ambulatory Visit (INDEPENDENT_AMBULATORY_CARE_PROVIDER_SITE_OTHER): Payer: Medicare Other | Admitting: Internal Medicine

## 2020-01-04 ENCOUNTER — Other Ambulatory Visit: Payer: Self-pay

## 2020-01-04 ENCOUNTER — Encounter: Payer: Self-pay | Admitting: Internal Medicine

## 2020-01-04 DIAGNOSIS — I25111 Atherosclerotic heart disease of native coronary artery with angina pectoris with documented spasm: Secondary | ICD-10-CM

## 2020-01-04 DIAGNOSIS — F419 Anxiety disorder, unspecified: Secondary | ICD-10-CM | POA: Diagnosis not present

## 2020-01-04 DIAGNOSIS — I10 Essential (primary) hypertension: Secondary | ICD-10-CM | POA: Diagnosis not present

## 2020-01-04 DIAGNOSIS — J40 Bronchitis, not specified as acute or chronic: Secondary | ICD-10-CM | POA: Diagnosis not present

## 2020-01-04 DIAGNOSIS — J42 Unspecified chronic bronchitis: Secondary | ICD-10-CM | POA: Diagnosis not present

## 2020-01-04 MED ORDER — PHENYTOIN SODIUM EXTENDED 100 MG PO CAPS
ORAL_CAPSULE | ORAL | 3 refills | Status: DC
Start: 2020-01-04 — End: 2020-12-18

## 2020-01-04 MED ORDER — PROMETHAZINE-CODEINE 6.25-10 MG/5ML PO SOLN: 5.0000 mL | Freq: Four times a day (QID) | ORAL | 1 refills | Status: DC | PRN

## 2020-01-04 MED ORDER — CLONAZEPAM 1 MG PO TABS
ORAL_TABLET | ORAL | 1 refills | Status: DC
Start: 2020-01-04 — End: 2020-04-28

## 2020-01-04 NOTE — Assessment & Plan Note (Signed)
Better on Levaquin

## 2020-01-04 NOTE — Assessment & Plan Note (Signed)
Clonazepam 

## 2020-01-04 NOTE — Assessment & Plan Note (Signed)
Prom-cod

## 2020-01-04 NOTE — Assessment & Plan Note (Signed)
Better Calan, Furosemide

## 2020-01-04 NOTE — Progress Notes (Signed)
Subjective:  Patient ID: Veronica [name], female    DOB: 02-11-1951  Age: 69 y.o. MRN: [id number]  CC: Follow-up   HPI Veronica [name] presents for SOB, bronchitis f/u - much better: treated w/Levaquin, IM steroids. On daily furosemide.  Outpatient Medications Prior to Visit  Medication Sig Dispense Refill  . acetaminophen-codeine (TYLENOL #3) 300-30 MG tablet TAKE 1 TABLET BY MOUTH EVERY 4 HOURS AS NEEDED 60 tablet 0  . albuterol (PROVENTIL) (2.5 MG/3ML) 0.083% nebulizer solution USE 1 VIAL IN NEBULIZER 2 TIMES DAILY. Generic: VENTOLIN 60 vial 5  . albuterol (VENTOLIN HFA) 108 (90 Base) MCG/ACT inhaler INHALE 1 TO 2 PUFFS INTO THE LUNGS EVERY 4 HOURS AS NEEDED FOR WHEEZING OR SHORTNESS OF BREATH 8.5 g 3  . aspirin 81 MG chewable tablet Chew by mouth daily.    [name] atorvastatin (LIPITOR) 40 MG tablet TAKE 1 TABLET(40 MG) BY MOUTH DAILY 90 tablet 3  . B-D INSULIN SYRINGE 1CC/25GX1" 25G X 1" 1 ML MISC USE AS DIRECTED EVERY 2 WEEKS 50 each 5  . budesonide (PULMICORT) 0.25 MG/2ML nebulizer solution USE 1 VIAL IN NEBULIZER 2 TIMES DAILY. Generic: PULMICORT 120 mL 2  . calcium carbonate (OSCAL) 1500 (600 Ca) MG TABS tablet Take 600 mg of elemental calcium by mouth 2 (two) times daily with a meal.     . cariprazine (VRAYLAR) capsule Take 1 capsule (1.5 mg total) by mouth daily. 90 capsule 3  . cetirizine (ZYRTEC) 5 MG tablet Take 1 tablet (5 mg total) by mouth daily. 30 tablet 1  . Cholecalciferol (VITAMIN D3) 50 MCG (2000 UT) capsule Take 1 capsule (2,000 Units total) by mouth daily. 100 capsule 3  . clonazePAM (KLONOPIN) 1 MG tablet TAKE 2 TABLETS BY MOUTH EVERY NIGHT AT BEDTIME AND 1/2 TABLET EXTRA IN DAYTIME AS NEEDED 225 tablet 1  . cyanocobalamin (,VITAMIN B-12,) 1000 MCG/ML injection INJECT 1ML IN THE MUSCLE EVERY 14 DAYS. 10 mL 5  . Cyanocobalamin (VITAMIN B-12 PO) Take 5,000 mcg by mouth daily.    . diphenoxylate-atropine (LOMOTIL) 2.5-0.025 MG tablet Take 1-2 tablets by mouth 4 (four)  times daily as needed for diarrhea or loose stools. 30 tablet 1  . Docusate Calcium (STOOL SOFTENER PO) Take 100 mg by mouth as needed (for constipation).     [name] escitalopram (LEXAPRO) 20 MG tablet TAKE 1 TABLET BY MOUTH DAILY 90 tablet 3  . formoterol (PERFOROMIST) 20 MCG/2ML nebulizer solution Take 2 mLs (20 mcg total) by nebulization 2 (two) times daily. 120 mL 3  . furosemide (LASIX) 20 MG tablet TAKE 1 TABLET(20 MG) BY MOUTH DAILY AS NEEDED 90 tablet 3  . ketoconazole (NIZORAL) 2 % cream Apply 1 application topically 2 (two) times daily. On rash 90 g 1  . ketoconazole (NIZORAL) 200 MG tablet Take 1 tablet (200 mg total) by mouth daily. 10 tablet 0  . levofloxacin (LEVAQUIN) 500 MG tablet Take 1 tablet (500 mg total) by mouth daily. 10 tablet 0  . levothyroxine (SYNTHROID) 88 MCG tablet TAKE 1 TABLET(88 MCG) BY MOUTH DAILY 90 tablet 3  . nitroGLYCERIN (NITROSTAT) 0.4 MG SL tablet USE AS DIRECTED SUBLINGUALLY 25 tablet 3  . Omega-3 Fatty Acids (FISH OIL) 1000 MG CAPS Take 1 capsule by mouth daily.    . phenytoin (DILANTIN) 100 MG ER capsule TAKE 2 CAPSULES BY MOUTH EVERY MORNING AND 1 CAPSULE EVERY EVENING 270 capsule 3  . PREVIDENT 5000 BOOSTER PLUS 1.1 % PSTE BRUSH BY MOUTH TWICE DAILY    .  promethazine (PHENERGAN) 12.5 MG tablet 12.5 mg every 6 (six) hours as needed.     . Promethazine-Codeine 6.25-10 MG/5ML SOLN Take 5 mLs by mouth every 6 (six) hours as needed. 300 mL 1  . Respiratory Therapy Supplies (FLUTTER) DEVI Use after nebulization treatments 1 each 0  . sodium chloride (MURO 128) 5 % ophthalmic solution Place 1 drop into both eyes as needed for irritation.     . SYRINGE-NEEDLE, DISP, 3 ML (BD ECLIPSE SYRINGE) 25G X 1" 3 ML MISC Use sq q 2 wks 50 each 3  . traZODone (DESYREL) 50 MG tablet TAKE 4 TABLETS(200 MG) BY MOUTH AT BEDTIME 360 tablet 3  . verapamil (CALAN-SR) 240 MG CR tablet TAKE 1 TABLET(240 MG) BY MOUTH AT BEDTIME 90 tablet 3   No facility-administered medications prior  to visit.    ROS: Review of Systems  Constitutional: Negative for activity change, appetite change, chills, fatigue and unexpected weight change.  HENT: Negative for congestion, mouth sores and sinus pressure.   Eyes: Negative for visual disturbance.  Respiratory: Positive for cough and shortness of breath. Negative for chest tightness.   Gastrointestinal: Negative for abdominal pain and nausea.  Genitourinary: Negative for difficulty urinating, frequency and vaginal pain.  Musculoskeletal: Negative for back pain and gait problem.  Skin: Negative for pallor and rash.  Neurological: Negative for dizziness, tremors, weakness, numbness and headaches.  Psychiatric/Behavioral: Negative for confusion, sleep disturbance and suicidal ideas. The patient is nervous/anxious.     Objective:  BP (!) 145/64 (BP Location: Right Arm, Patient Position: Sitting, Cuff Size: Large)   Pulse 75   Temp 98.5 F (36.9 C) (Temporal)   Ht 5' (1.524 m)   Wt 155 lb (70.3 kg)   SpO2 97%   BMI 30.27 kg/m   BP Readings from Last 3 Encounters:  01/04/20 (!) 145/64  12/03/19 (!) 180/90  11/30/19 122/70    Wt Readings from Last 3 Encounters:  01/04/20 155 lb (70.3 kg)  12/03/19 154 lb (69.9 kg)  11/30/19 157 lb 12.8 oz (71.6 kg)    Physical Exam Constitutional:      General: She is not in acute distress.    Appearance: She is well-developed.  HENT:     Head: Normocephalic.     Right Ear: External ear normal.     Left Ear: External ear normal.     Nose: Nose normal.  Eyes:     General:        Right eye: No discharge.        Left eye: No discharge.     Conjunctiva/sclera: Conjunctivae normal.     Pupils: Pupils are equal, round, and reactive to light.  Neck:     Thyroid: No thyromegaly.     Vascular: No JVD.     Trachea: No tracheal deviation.  Cardiovascular:     Rate and Rhythm: Normal rate and regular rhythm.     Heart sounds: Normal heart sounds.  Pulmonary:     Effort: No respiratory  distress.     Breath sounds: No stridor. No wheezing.  Abdominal:     General: Bowel sounds are normal. There is no distension.     Palpations: Abdomen is soft. There is no mass.     Tenderness: There is no abdominal tenderness. There is no guarding or rebound.  Musculoskeletal:        General: No tenderness.     Cervical back: Normal range of motion and neck supple.  Lymphadenopathy:  Cervical: No cervical adenopathy.  Skin:    Findings: No erythema or rash.  Neurological:     Cranial Nerves: No cranial nerve deficit.     Motor: No abnormal muscle tone.     Coordination: Coordination normal.     Deep Tendon Reflexes: Reflexes normal.  Psychiatric:        Behavior: Behavior normal.        Thought Content: Thought content normal.        Judgment: Judgment normal.    coughing some  Lab Results  Component Value Date   WBC 10.8 (H) 08/23/2019   HGB 12.4 08/23/2019   HCT 36.9 08/23/2019   PLT 279.0 08/23/2019   GLUCOSE 70 12/03/2019   CHOL 249 (H) 11/10/2018   TRIG 88.0 11/10/2018   HDL 57.70 11/10/2018   LDLDIRECT 165.0 09/26/2009   LDLCALC 174 (H) 11/10/2018   ALT 13 12/03/2019   AST 22 12/03/2019   NA 142 12/03/2019   K 4.1 12/03/2019   CL 101 12/03/2019   CREATININE 0.61 12/03/2019   BUN 6 (L) 12/03/2019   CO2 30 12/03/2019   TSH 2.22 11/10/2018   INR 1.0 ratio 11/07/2009   HGBA1C 5.6 08/23/2019    MR 3D Recon At Scanner  Result Date: 02/12/2019 CLINICAL DATA:  Right upper quadrant abdominal pain. Suspected cholecystitis. EXAM: MRI ABDOMEN WITHOUT AND WITH CONTRAST (INCLUDING MRCP) TECHNIQUE: Multiplanar multisequence MR imaging of the abdomen was performed both before and after the administration of intravenous contrast. Heavily T2-weighted images of the biliary and pancreatic ducts were obtained, and three-dimensional MRCP images were rendered by post processing. CONTRAST:  71mL GADAVIST GADOBUTROL 1 MMOL/ML IV SOLN COMPARISON:  Abdominal ultrasound 08/07/2015.  Abdominal CT 08/31/2010. FINDINGS: Despite efforts by the technologist and patient, mild motion artifact is present on today's exam and could not be eliminated. This reduces exam sensitivity and specificity. Lower chest: There are no significant findings within the visualized lower chest. Hepatobiliary: Small hepatic cysts are noted. There are new suspicious hepatic findings. The gallbladder is surgically absent. There is mild fusiform extrahepatic biliary dilatation with the common hepatic duct measuring up to 8 mm in diameter. The duct tapers distally and appears similar to previous CT. No evidence of choledocholithiasis or intrahepatic biliary dilatation. Pancreas: Unremarkable. No pancreatic ductal dilatation or surrounding inflammatory changes. Stable prominent fat between the pancreatic head and the superior mesenteric vein. Spleen: Normal in size without focal abnormality. Adrenals/Urinary Tract: Both adrenal glands appear normal. Small right renal cyst. No evidence of renal mass or hydronephrosis. Stomach/Bowel: No evidence of bowel wall thickening, distention or surrounding inflammatory change. Vascular/Lymphatic: There are no enlarged abdominal lymph nodes. Aortic and branch vessel atherosclerosis. No acute vascular findings identified. Other: The visualized anterior abdominal wall appears intact. No ascites. Musculoskeletal: No acute or significant osseous findings. Facet hypertrophy and associated synovial enhancement noted within the mid lumbar spine. IMPRESSION: 1. No acute findings or explanation for the patient's symptoms. 2. Mild fusiform extrahepatic biliary dilatation status post cholecystectomy, similar to previous CT. No evidence of choledocholithiasis or intrahepatic biliary dilatation. 3.  Aortic Atherosclerosis (ICD10-I70.0). Electronically Signed   By: Richardean Sale M.D.   On: 02/12/2019 13:07   MR ABD/MRCP  Result Date: 02/12/2019 CLINICAL DATA:  Right upper quadrant abdominal pain.  Suspected cholecystitis. EXAM: MRI ABDOMEN WITHOUT AND WITH CONTRAST (INCLUDING MRCP) TECHNIQUE: Multiplanar multisequence MR imaging of the abdomen was performed both before and after the administration of intravenous contrast. Heavily T2-weighted images of the biliary and pancreatic  ducts were obtained, and three-dimensional MRCP images were rendered by post processing. CONTRAST:  81mL GADAVIST GADOBUTROL 1 MMOL/ML IV SOLN COMPARISON:  Abdominal ultrasound 08/07/2015. Abdominal CT 08/31/2010. FINDINGS: Despite efforts by the technologist and patient, mild motion artifact is present on today's exam and could not be eliminated. This reduces exam sensitivity and specificity. Lower chest: There are no significant findings within the visualized lower chest. Hepatobiliary: Small hepatic cysts are noted. There are new suspicious hepatic findings. The gallbladder is surgically absent. There is mild fusiform extrahepatic biliary dilatation with the common hepatic duct measuring up to 8 mm in diameter. The duct tapers distally and appears similar to previous CT. No evidence of choledocholithiasis or intrahepatic biliary dilatation. Pancreas: Unremarkable. No pancreatic ductal dilatation or surrounding inflammatory changes. Stable prominent fat between the pancreatic head and the superior mesenteric vein. Spleen: Normal in size without focal abnormality. Adrenals/Urinary Tract: Both adrenal glands appear normal. Small right renal cyst. No evidence of renal mass or hydronephrosis. Stomach/Bowel: No evidence of bowel wall thickening, distention or surrounding inflammatory change. Vascular/Lymphatic: There are no enlarged abdominal lymph nodes. Aortic and branch vessel atherosclerosis. No acute vascular findings identified. Other: The visualized anterior abdominal wall appears intact. No ascites. Musculoskeletal: No acute or significant osseous findings. Facet hypertrophy and associated synovial enhancement noted within the mid  lumbar spine. IMPRESSION: 1. No acute findings or explanation for the patient's symptoms. 2. Mild fusiform extrahepatic biliary dilatation status post cholecystectomy, similar to previous CT. No evidence of choledocholithiasis or intrahepatic biliary dilatation. 3.  Aortic Atherosclerosis (ICD10-I70.0). Electronically Signed   By: Richardean Sale M.D.   On: 02/12/2019 13:07    Assessment & Plan:   There are no diagnoses linked to this encounter.   No orders of the defined types were placed in this encounter.    Follow-up: No follow-ups on file.  Walker Kehr, MD

## 2020-01-07 ENCOUNTER — Ambulatory Visit (INDEPENDENT_AMBULATORY_CARE_PROVIDER_SITE_OTHER): Payer: Medicare Other | Admitting: Psychology

## 2020-01-07 DIAGNOSIS — F331 Major depressive disorder, recurrent, moderate: Secondary | ICD-10-CM | POA: Diagnosis not present

## 2020-01-21 ENCOUNTER — Ambulatory Visit (INDEPENDENT_AMBULATORY_CARE_PROVIDER_SITE_OTHER): Payer: Medicare Other | Admitting: Psychology

## 2020-01-21 DIAGNOSIS — F331 Major depressive disorder, recurrent, moderate: Secondary | ICD-10-CM

## 2020-01-29 ENCOUNTER — Other Ambulatory Visit: Payer: Self-pay | Admitting: *Deleted

## 2020-01-29 NOTE — Patient Instructions (Addendum)
Goals Addressed            This Visit's Progress   . Eat Healthy       Follow Up Date 03/13/20    - set goal weight - drink 6 to 8 glasses of water each day - manage portion size - prepare main meal at home 3 to 5 days each week - read food labels for fat, fiber, carbohydrates and portion size - set a realistic goal    Why is this important?   When you are ready to manage your nutrition or weight, having a plan and setting goals will help.  Taking small steps to change how you eat and exercise is a good place to start.    Notes: Patient monitors sodium content in foods and reports she is making healthier food choices. Nurse sent patient Planning Healthy Meals booklet.     . Learn and Do Breathing Exercises       Follow Up Date 03/13/20    - do breathing exercises every day - do exercises in a comfortable position that makes breathing as easy as possible    Why is this important?   Breathing exercises can help lessen the cough that comes with chronic obstructive pulmonary disease.  Doing the exercises will give you more energy.  They will also help you to control your symptoms.    Notes: Patient does deep breathing exercises and uses her flutter device twice daily. She explains that she walks around her home as much as possible and does chair exercises to help improve her respiratory status and maintain her strength.   Updated: 01/29/20    . Manage Fatigue (Tiredness)       Follow Up Date 03/13/20    - eat healthy - get at least 7 to 8 hours of sleep at night - get outdoors every day (weather permitting) - keep room cool and dark - practice relaxation or meditation daily - use devices that will help like a cane, sock-puller or reacher    Why is this important?   Feeling tired or worn out is a common symptom of COPD (chronic obstructive pulmonary disease).  Learning when you feel your best and when you need rest is important.  Managing the tiredness (fatigue) will help  you be active and enjoy life.     Notes: Nurse sent patient Planning Healthy Meals booklet. Patient does get out of the house daily (weather and breathing permitting). She does use a straight cane and plans to discuss getting a quad cane with PCP during her upcoming appointment on 02/28/20.  Updated: 01/29/20     . Patient will not have any ED or hospital visits within the next 90  days   On track    [place] (see longtitudinal plan of care for additional care plan information)  Current Barriers:  [name] Knowledge deficits related to basic COPD self care/management   Case Manager Clinical Goal(s):  Over the next 90 days, patient will be able to verbalize understanding of COPD action plan and when to seek appropriate levels of medical care  Over the next 90 days, patient will engage in lite exercise as tolerated to build/regain stamina and strength and reduce shortness of breath through activity tolerance  Over the next 90 days, patient will not be hospitalized for COPD exacerbation   Interventions:   Provided patient with COPD action plan and reinforced importance of daily self assessment  Advised patient to self assesses COPD action plan  zone and make appointment with provider if in the yellow zone for 48 hours without improvement.  Advised patient to engage in light exercise as tolerated 3-5 days a week  Patient Self Care Activities:  . Takes medications as prescribed including inhalers . Practices and uses pursed lip breathing for shortness of breath recovery and prevention . Self assesses COPD action plan zone and makes appointment with provider if in the yellow zone for 48 hours without improvement. . Patient increases her physical activity as tolerated based upon her respiratory status.  Please see past updates related to this goal by clicking on the "Past Updates" button in the selected goal  Updated: 01/29/20       . Track and Manage My Symptoms       Follow Up  Date 03/13/20    - develop a rescue plan - eliminate symptom triggers at home - follow rescue plan if symptoms flare-up - keep follow-up appointments - use an extra pillow to sleep    Why is this important?   Tracking your symptoms and other information about your health helps your doctor plan your care.  Write down the symptoms, the time of day, what you were doing and what medicine you are taking.  You will soon learn how to manage your symptoms.     Notes: Patient does eliminate symptom triggers at home. Nurse sent her COPD zones and action plans. Patient does contact her provider as needed when she has an COPD exacerbation. Updated: 01/29/20    . COMPLETED: Track and Manage My Triggers       Follow Up Date 01/29/20   - avoid second hand smoke - eliminate smoking in my home - identify and remove indoor air pollutants - limit outdoor activity during cold weather - listen for public air quality announcements every day    Why is this important?   Triggers are activities or things, like tobacco smoke or cold weather, that make your COPD (chronic obstructive pulmonary disease) flare-up.  Knowing these triggers helps you plan how to stay away from them.  When you cannot remove them, you can learn how to manage them.     Notes: Patient does ensure that her environment is safe from COPD triggers. She limits her outdoor activities based upon the weather and quality of the air announcements Updated:01/29/20

## 2020-01-29 NOTE — Patient Outreach (Signed)
[facility]) Care Management  [place]  01/29/2020   Veronica [name] [birth year] [id number]  Subjective: Successful telephone outreach call to patient. HIPAA identifiers obtained. Patient reports she is doing well in general. Patient explained that she has a new pulmonologist Dr. Silas Flood who she sees on 02/12/20. She also has an upcoming appointment with her PCP on 02/28/20 and she continues to have appointments every 2 weeks with her counselor Terri Bauert LCSW. Patient explains that she is currently doing well emotionally, she denies having any falls, and she will ask PCP for an order for a quad cane during her upcoming appointment. She feels the quad cane will give her additional stability especially with ambulating outside.   Patient states she is at baseline with her COPD. She uses her rescue inhaler 1-3 times daily and her nebulizer and flutter device twice daily which is typical for her. She had her annual flu shot 11/30/19 and plans to get her Covid booster in December which will be her 6 month point after her last covid vaccine.  Patient continues with her goal of getting out of the house daily to boost her moral, she walks around her home doing small house-holds tasks, and rests when needed. Patient does chair exercises to maintain her strength, she is adhering to a low sodium diet, and she is making healthier food choices. Nurse encouraged patient to continue to weigh herself  take her B/P, and record the values daily. Patient continues to supplement with Ensure and nurse will send her Ensure coupons. Patient did not have any further questions or concerns today and states will call this nurse if needed.   Encounter Medications:  Outpatient Encounter Medications as of 01/29/2020  Medication Sig Note  . acetaminophen-codeine (TYLENOL #3) 300-30 MG tablet TAKE 1 TABLET BY MOUTH EVERY 4 HOURS AS NEEDED   . albuterol (PROVENTIL) (2.5 MG/3ML) 0.083% nebulizer solution  USE 1 VIAL IN NEBULIZER 2 TIMES DAILY. Generic: VENTOLIN 07/10/2019: Uses 1 vial two times daily.  [name] albuterol (VENTOLIN HFA) 108 (90 Base) MCG/ACT inhaler INHALE 1 TO 2 PUFFS INTO THE LUNGS EVERY 4 HOURS AS NEEDED FOR WHEEZING OR SHORTNESS OF BREATH   . aspirin 81 MG chewable tablet Chew by mouth daily.   [name] atorvastatin (LIPITOR) 40 MG tablet TAKE 1 TABLET(40 MG) BY MOUTH DAILY   . B-D INSULIN SYRINGE 1CC/25GX1" 25G X 1" 1 ML MISC USE AS DIRECTED EVERY 2 WEEKS   . budesonide (PULMICORT) 0.25 MG/2ML nebulizer solution USE 1 VIAL IN NEBULIZER 2 TIMES DAILY. Generic: PULMICORT   . calcium carbonate (OSCAL) 1500 (600 Ca) MG TABS tablet Take 600 mg of elemental calcium by mouth 2 (two) times daily with a meal.    . cariprazine (VRAYLAR) capsule Take 1 capsule (1.5 mg total) by mouth daily.   . cetirizine (ZYRTEC) 5 MG tablet Take 1 tablet (5 mg total) by mouth daily. 03/23/2019: PRN  . Cholecalciferol (VITAMIN D3) 50 MCG (2000 UT) capsule Take 1 capsule (2,000 Units total) by mouth daily.   . clonazePAM (KLONOPIN) 1 MG tablet TAKE 2 TABLETS BY MOUTH EVERY NIGHT AT BEDTIME AND 1/2 TABLET EXTRA IN DAYTIME AS NEEDED   . cyanocobalamin (,VITAMIN B-12,) 1000 MCG/ML injection INJECT 1ML IN THE MUSCLE EVERY 14 DAYS.   [name] Cyanocobalamin (VITAMIN B-12 PO) Take 5,000 mcg by mouth daily.   . diphenoxylate-atropine (LOMOTIL) 2.5-0.025 MG tablet Take 1-2 tablets by mouth 4 (four) times daily as needed for diarrhea or loose stools. 07/10/2019:  Last used 2 weeks ago.  Mariane Baumgarten Calcium (STOOL SOFTENER PO) Take 100 mg by mouth as needed (for constipation).  07/10/2019: Last used 2 weeks ago.  . escitalopram (LEXAPRO) 20 MG tablet TAKE 1 TABLET BY MOUTH DAILY   . formoterol (PERFOROMIST) 20 MCG/2ML nebulizer solution Take 2 mLs (20 mcg total) by nebulization 2 (two) times daily.   . furosemide (LASIX) 20 MG tablet TAKE 1 TABLET(20 MG) BY MOUTH DAILY AS NEEDED   . ketoconazole (NIZORAL) 2 % cream Apply 1 application  topically 2 (two) times daily. On rash   . ketoconazole (NIZORAL) 200 MG tablet Take 1 tablet (200 mg total) by mouth daily.   [name] levofloxacin (LEVAQUIN) 500 MG tablet Take 1 tablet (500 mg total) by mouth daily. 12/18/2019: completed  . levothyroxine (SYNTHROID) 88 MCG tablet TAKE 1 TABLET(88 MCG) BY MOUTH DAILY   . nitroGLYCERIN (NITROSTAT) 0.4 MG SL tablet USE AS DIRECTED SUBLINGUALLY   . Omega-3 Fatty Acids (FISH OIL) 1000 MG CAPS Take 1 capsule by mouth daily.   . phenytoin (DILANTIN) 100 MG ER capsule TAKE 2 CAPSULES BY MOUTH EVERY MORNING AND 1 CAPSULE EVERY EVENING   . PREVIDENT 5000 BOOSTER PLUS 1.1 % PSTE BRUSH BY MOUTH TWICE DAILY   . promethazine (PHENERGAN) 12.5 MG tablet 12.5 mg every 6 (six) hours as needed.  07/10/2019: Using as needed for nausea associated with migraines.  Last used 3 weeks ago.  . Promethazine-Codeine 6.25-10 MG/5ML SOLN Take 5 mLs by mouth every 6 (six) hours as needed.   [name] Respiratory Therapy Supplies (FLUTTER) DEVI Use after nebulization treatments   . sodium chloride (MURO 128) 5 % ophthalmic solution Place 1 drop into both eyes as needed for irritation.  07/10/2019: She is using daily.  . SYRINGE-NEEDLE, DISP, 3 ML (BD ECLIPSE SYRINGE) 25G X 1" 3 ML MISC Use sq q 2 wks   . traZODone (DESYREL) 50 MG tablet TAKE 4 TABLETS(200 MG) BY MOUTH AT BEDTIME   . verapamil (CALAN-SR) 240 MG CR tablet TAKE 1 TABLET(240 MG) BY MOUTH AT BEDTIME    No facility-administered encounter medications on file as of 01/29/2020.    Functional Status:  In your present state of health, do you have any difficulty performing the following activities: 05/08/2019 02/27/2019  Hearing? N N  Vision? N N  Difficulty concentrating or making decisions? N N  Walking or climbing stairs? N N  Dressing or bathing? N N  Doing errands, shopping? N N  Preparing Food and eating ? N N  Using the Toilet? N N  In the past six months, have you accidently leaked urine? N N  Do you have problems with  loss of bowel control? N N  Managing your Medications? N N  Managing your Finances? N N  Housekeeping or managing your Housekeeping? N N  Some recent data might be hidden    Fall/Depression Screening: Fall Risk  01/29/2020 01/29/2020 12/18/2019  Falls in the past year? 1 1 1   Comment - - -  Number falls in past yr: 0 0 0  Comment - - -  Injury with Fall? 0 0 0  Risk for fall due to : Impaired balance/gait;Impaired mobility;History of fall(s) - -  Follow up Falls prevention discussed;Education provided;Falls evaluation completed Falls prevention discussed;Education provided;Falls evaluation completed Falls prevention discussed;Education provided;Falls evaluation completed   PHQ 2/9 Scores 12/19/2019 11/22/2019 05/08/2019 02/27/2019 02/21/2018 03/04/2017 02/18/2017  PHQ - 2 Score 2 0 4 4 4 1 6   PHQ- 9  Score 8 0 7 8 9  - 13    Assessment:  Goals Addressed            This Visit's Progress   . Eat Healthy       Follow Up Date 03/13/20    - set goal weight - drink 6 to 8 glasses of water each day - manage portion size - prepare main meal at home 3 to 5 days each week - read food labels for fat, fiber, carbohydrates and portion size - set a realistic goal    Why is this important?   When you are ready to manage your nutrition or weight, having a plan and setting goals will help.  Taking small steps to change how you eat and exercise is a good place to start.    Notes: Patient monitors sodium content in foods. Nurse sent patient Planning Healthy Meals booklet.     . Learn and Do Breathing Exercises       Follow Up Date 03/13/20    - do breathing exercises every day - do exercises in a comfortable position that makes breathing as easy as possible    Why is this important?   Breathing exercises can help lessen the cough that comes with chronic obstructive pulmonary disease.  Doing the exercises will give you more energy.  They will also help you to control your symptoms.     Notes: Patient does deep breathing exercises and uses her flutter device twice daily. She explains that she walks around her home as much as possible and does chair exercises to to help improve her respiratory status and maintain her strength.   Updated: 01/29/20    . Manage Fatigue (Tiredness)       Follow Up Date 03/13/20    - eat healthy - get at least 7 to 8 hours of sleep at night - get outdoors every day (weather permitting) - keep room cool and dark - practice relaxation or meditation daily - use devices that will help like a cane, sock-puller or reacher    Why is this important?   Feeling tired or worn out is a common symptom of COPD (chronic obstructive pulmonary disease).  Learning when you feel your best and when you need rest is important.  Managing the tiredness (fatigue) will help you be active and enjoy life.     Notes: Nurse sent patient Planning Healthy Meals booklet. Patient does get out of the house daily (weather and breathing permitting). She does use a straight cane and plans to discuss getting a quad cane with PCP during her upcoming appointment on 02/28/20.  Updated: 01/29/20     . Patient will not have any ED or hospital visits within the next 90  days   On track    [place] (see longtitudinal plan of care for additional care plan information)  Current Barriers:  [name] Knowledge deficits related to basic COPD self care/management   Case Manager Clinical Goal(s):  Over the next 90 days, patient will be able to verbalize understanding of COPD action plan and when to seek appropriate levels of medical care  Over the next 90 days, patient will engage in lite exercise as tolerated to build/regain stamina and strength and reduce shortness of breath through activity tolerance  Over the next 90 days, patient will not be hospitalized for COPD exacerbation   Interventions:   Provided patient with COPD action plan and reinforced importance of daily self  assessment  Advised  patient to self assesses COPD action plan zone and make appointment with provider if in the yellow zone for 48 hours without improvement.  Advised patient to engage in light exercise as tolerated 3-5 days a week  Patient Self Care Activities:  . Takes medications as prescribed including inhalers . Practices and uses pursed lip breathing for shortness of breath recovery and prevention . Self assesses COPD action plan zone and makes appointment with provider if in the yellow zone for 48 hours without improvement. . Patient increases her physical activity as tolerated based upon her respiratory status.  Please see past updates related to this goal by clicking on the "Past Updates" button in the selected goal  Updated: 01/29/20       . Track and Manage My Symptoms       Follow Up Date 03/13/20    - develop a rescue plan - eliminate symptom triggers at home - follow rescue plan if symptoms flare-up - keep follow-up appointments - use an extra pillow to sleep    Why is this important?   Tracking your symptoms and other information about your health helps your doctor plan your care.  Write down the symptoms, the time of day, what you were doing and what medicine you are taking.  You will soon learn how to manage your symptoms.     Notes: Patient does eliminate symptom triggers at home. Nurse sent her COPD zones and action plans. Patient does contact her provider as needed when she has an COPD exacerbation. Updated: 01/29/20    . COMPLETED: Track and Manage My Triggers       Follow Up Date 01/29/20   - avoid second hand smoke - eliminate smoking in my home - identify and remove indoor air pollutants - limit outdoor activity during cold weather - listen for public air quality announcements every day    Why is this important?   Triggers are activities or things, like tobacco smoke or cold weather, that make your COPD (chronic obstructive pulmonary disease)  flare-up.  Knowing these triggers helps you plan how to stay away from them.  When you cannot remove them, you can learn how to manage them.     Notes: Patient does ensure that her environment is safe from COPD triggers. She limits her outdoor activities based upon the weather and quality of the air announcements Updated:01/29/20       Plan: [place] will send patient Ensure coupons, will call patient within the month of December, and patient agrees to future outreach calls.   Emelia Loron RN, BSN [place] [phone] [email or web address]

## 2020-02-04 ENCOUNTER — Ambulatory Visit (INDEPENDENT_AMBULATORY_CARE_PROVIDER_SITE_OTHER): Payer: Medicare Other | Admitting: Psychology

## 2020-02-04 DIAGNOSIS — F331 Major depressive disorder, recurrent, moderate: Secondary | ICD-10-CM | POA: Diagnosis not present

## 2020-02-07 ENCOUNTER — Other Ambulatory Visit: Payer: Self-pay | Admitting: Internal Medicine

## 2020-02-11 ENCOUNTER — Telehealth: Payer: Self-pay | Admitting: Internal Medicine

## 2020-02-11 ENCOUNTER — Other Ambulatory Visit: Payer: Self-pay | Admitting: Internal Medicine

## 2020-02-11 NOTE — Telephone Encounter (Signed)
cariprazine (VRAYLAR) capsule  Patient waiting on patient assistance for this medication

## 2020-02-12 ENCOUNTER — Encounter: Payer: Self-pay | Admitting: Pulmonary Disease

## 2020-02-12 ENCOUNTER — Other Ambulatory Visit: Payer: Self-pay

## 2020-02-12 ENCOUNTER — Ambulatory Visit (INDEPENDENT_AMBULATORY_CARE_PROVIDER_SITE_OTHER): Payer: Medicare Other | Admitting: Pulmonary Disease

## 2020-02-12 VITALS — BP 118/70 | HR 75 | Temp 97.6°F | Wt 159.2 lb

## 2020-02-12 DIAGNOSIS — R0600 Dyspnea, unspecified: Secondary | ICD-10-CM | POA: Diagnosis not present

## 2020-02-12 DIAGNOSIS — M7989 Other specified soft tissue disorders: Secondary | ICD-10-CM | POA: Diagnosis not present

## 2020-02-12 DIAGNOSIS — R0609 Other forms of dyspnea: Secondary | ICD-10-CM

## 2020-02-12 NOTE — Patient Instructions (Addendum)
We will get labs today  We will be in touch on resuming the lasix every day  Someone will schedule a heart ultrasound  Return to clinic in 3 months with Dr. Silas Flood

## 2020-02-13 LAB — BASIC METABOLIC PANEL
BUN: 5 mg/dL — ABNORMAL LOW (ref 6–23)
CO2: 35 mEq/L — ABNORMAL HIGH (ref 19–32)
Calcium: 8 mg/dL — ABNORMAL LOW (ref 8.4–10.5)
Chloride: 98 mEq/L (ref 96–112)
Creatinine, Ser: 0.78 mg/dL (ref 0.40–1.20)
GFR: 76.7 mL/min (ref >60.00)
Glucose, Bld: 68 mg/dL — ABNORMAL LOW (ref 70–99)
Potassium: 3.8 mEq/L (ref 3.5–5.1)
Sodium: 141 mEq/L (ref 135–145)

## 2020-02-13 LAB — MAGNESIUM: Magnesium: 1.7 mg/dL (ref 1.5–2.5)

## 2020-02-14 ENCOUNTER — Other Ambulatory Visit: Payer: Self-pay

## 2020-02-14 ENCOUNTER — Ambulatory Visit (HOSPITAL_COMMUNITY): Payer: Medicare Other | Attending: Cardiovascular Disease

## 2020-02-14 DIAGNOSIS — R0600 Dyspnea, unspecified: Secondary | ICD-10-CM | POA: Insufficient documentation

## 2020-02-14 DIAGNOSIS — R0609 Other forms of dyspnea: Secondary | ICD-10-CM

## 2020-02-14 DIAGNOSIS — M7989 Other specified soft tissue disorders: Secondary | ICD-10-CM

## 2020-02-14 LAB — ECHOCARDIOGRAM COMPLETE
Area-P 1/2: 3.99 cm2
S' Lateral: 2.5 cm

## 2020-02-14 MED ORDER — CARIPRAZINE HCL 1.5 MG PO CAPS
1.5000 mg | ORAL_CAPSULE | Freq: Every day | ORAL | 3 refills | Status: DC
Start: 2020-02-14 — End: 2020-03-19

## 2020-02-14 NOTE — Telephone Encounter (Signed)
Called [employer] pt Assistance line [phone]. Tried to order medication it states pt application has expired or reach its limit. Will have to complete new rx. Faxing to MD office to complete.Marland KitchenJohny Chess

## 2020-02-14 NOTE — Telephone Encounter (Signed)
Rec'd new application completed MD portion. Called ot to inform her that her old application had expired, and she will have to complete new one. I have filled out Dr.Plot portion we will wait on pt to finish.Marland KitchenJohny [name]

## 2020-02-15 NOTE — Telephone Encounter (Signed)
Pt came by to fill her portion out on application. Faxed to [facility] pt assistance. Waiting on approval status,,/lmb

## 2020-02-15 NOTE — Progress Notes (Signed)
Heart ultrasound is normal. At visit we discussed taking lasix daily - based on heart ultrasound recommend she take as she was doing prior to our visit. Thanks!

## 2020-02-15 NOTE — Progress Notes (Signed)
Called and spoke with patient, advised of results and recommendations per Dr. Silas Flood.  She verbalized understanding.  She requested a copy of the report her echo, will print and put in the mail to patient.  Dr. Silas Flood,  She is requesting the lab results from her last visit with you.  Please advise.  Thank you.

## 2020-02-18 ENCOUNTER — Ambulatory Visit: Payer: Medicare Other | Admitting: Psychology

## 2020-02-19 ENCOUNTER — Ambulatory Visit: Payer: Medicare Other | Admitting: Internal Medicine

## 2020-02-20 NOTE — Telephone Encounter (Signed)
Pt came by made copt of last year taxes.. faxed back to [facility].Marland KitchenJohny [name]

## 2020-02-20 NOTE — Telephone Encounter (Signed)
Rec'd fax from [facility] asst they are needing proof of income. Called pt inform her what they are needing. Pt states she will com by this afternoon to give copy of tax information.Marland KitchenJohny Chess

## 2020-02-28 ENCOUNTER — Encounter: Payer: Self-pay | Admitting: Internal Medicine

## 2020-02-28 ENCOUNTER — Other Ambulatory Visit: Payer: Self-pay

## 2020-02-28 ENCOUNTER — Ambulatory Visit (INDEPENDENT_AMBULATORY_CARE_PROVIDER_SITE_OTHER): Payer: Medicare Other | Admitting: Internal Medicine

## 2020-02-28 DIAGNOSIS — I25111 Atherosclerotic heart disease of native coronary artery with angina pectoris with documented spasm: Secondary | ICD-10-CM

## 2020-02-28 DIAGNOSIS — E538 Deficiency of other specified B group vitamins: Secondary | ICD-10-CM

## 2020-02-28 DIAGNOSIS — G40909 Epilepsy, unspecified, not intractable, without status epilepticus: Secondary | ICD-10-CM

## 2020-02-28 DIAGNOSIS — E559 Vitamin D deficiency, unspecified: Secondary | ICD-10-CM

## 2020-02-28 DIAGNOSIS — M545 Low back pain, unspecified: Secondary | ICD-10-CM

## 2020-02-28 DIAGNOSIS — I10 Essential (primary) hypertension: Secondary | ICD-10-CM | POA: Diagnosis not present

## 2020-02-28 DIAGNOSIS — R252 Cramp and spasm: Secondary | ICD-10-CM

## 2020-02-28 DIAGNOSIS — E539 Vitamin B deficiency, unspecified: Secondary | ICD-10-CM

## 2020-02-28 DIAGNOSIS — J449 Chronic obstructive pulmonary disease, unspecified: Secondary | ICD-10-CM

## 2020-02-28 DIAGNOSIS — L509 Urticaria, unspecified: Secondary | ICD-10-CM

## 2020-02-28 DIAGNOSIS — E875 Hyperkalemia: Secondary | ICD-10-CM

## 2020-02-28 DIAGNOSIS — G8929 Other chronic pain: Secondary | ICD-10-CM

## 2020-02-28 DIAGNOSIS — E785 Hyperlipidemia, unspecified: Secondary | ICD-10-CM

## 2020-02-28 MED ORDER — PROMETHAZINE-CODEINE 6.25-10 MG/5ML PO SOLN: 5.0000 mL | Freq: Four times a day (QID) | ORAL | 1 refills | Status: DC | PRN

## 2020-02-28 MED ORDER — ACETAMINOPHEN-CODEINE #3 300-30 MG PO TABS: 1.0000 | ORAL_TABLET | ORAL | 2 refills | Status: DC | PRN

## 2020-02-28 MED ORDER — ESCITALOPRAM OXALATE 20 MG PO TABS
20.0000 mg | ORAL_TABLET | Freq: Every day | ORAL | 3 refills | Status: DC
Start: 2020-02-28 — End: 2020-08-26

## 2020-02-28 NOTE — Assessment & Plan Note (Addendum)
Try Lipitor 1/2 every other day due to cramps

## 2020-02-28 NOTE — Progress Notes (Signed)
Subjective:  Patient ID: Veronica [name], female    DOB: 10/01/1950  Age: 69 y.o. MRN: [id number]  CC: No chief complaint on file.   HPI Veronica [name] presents for cough, arthritis, dyslipidemia, leg cramps  Outpatient Medications Prior to Visit  Medication Sig Dispense Refill   acetaminophen-codeine (TYLENOL #3) 300-30 MG tablet TAKE 1 TABLET BY MOUTH EVERY 4 HOURS AS NEEDED 60 tablet 0   albuterol (PROVENTIL) (2.5 MG/3ML) 0.083% nebulizer solution USE 1 VIAL IN NEBULIZER 2 TIMES DAILY. Generic: VENTOLIN 60 vial 5   albuterol (VENTOLIN HFA) 108 (90 Base) MCG/ACT inhaler INHALE 1 TO 2 PUFFS INTO THE LUNGS EVERY 4 HOURS AS NEEDED FOR WHEEZING OR SHORTNESS OF BREATH 8.5 g 3   aspirin 81 MG chewable tablet Chew by mouth daily.     atorvastatin (LIPITOR) 40 MG tablet TAKE 1 TABLET(40 MG) BY MOUTH DAILY 90 tablet 3   B-D INSULIN SYRINGE 1CC/25GX1" 25G X 1" 1 ML MISC USE AS DIRECTED EVERY 2 WEEKS 50 each 5   budesonide (PULMICORT) 0.25 MG/2ML nebulizer solution USE 1 VIAL IN NEBULIZER 2 TIMES DAILY. Generic: PULMICORT 120 mL 2   calcium carbonate (OSCAL) 1500 (600 Ca) MG TABS tablet Take 600 mg of elemental calcium by mouth 2 (two) times daily with a meal.      cariprazine (VRAYLAR) capsule Take 1 capsule (1.5 mg total) by mouth daily. 90 capsule 3   Cholecalciferol (VITAMIN D3) 50 MCG (2000 UT) capsule Take 1 capsule (2,000 Units total) by mouth daily. 100 capsule 3   clonazePAM (KLONOPIN) 1 MG tablet TAKE 2 TABLETS BY MOUTH EVERY NIGHT AT BEDTIME AND 1/2 TABLET EXTRA IN DAYTIME AS NEEDED 225 tablet 1   cyanocobalamin (,VITAMIN B-12,) 1000 MCG/ML injection INJECT 1ML IN THE MUSCLE EVERY 14 DAYS. 10 mL 5   Cyanocobalamin (VITAMIN B-12 PO) Take 5,000 mcg by mouth daily.     diphenoxylate-atropine (LOMOTIL) 2.5-0.025 MG tablet Take 1-2 tablets by mouth 4 (four) times daily as needed for diarrhea or loose stools. 30 tablet 1   Docusate Calcium (STOOL SOFTENER PO) Take 100 mg by  mouth as needed (for constipation).      escitalopram (LEXAPRO) 20 MG tablet TAKE 1 TABLET BY MOUTH DAILY 90 tablet 2   formoterol (PERFOROMIST) 20 MCG/2ML nebulizer solution Take 2 mLs (20 mcg total) by nebulization 2 (two) times daily. 120 mL 3   furosemide (LASIX) 20 MG tablet TAKE 1 TABLET(20 MG) BY MOUTH DAILY AS NEEDED 90 tablet 3   ketoconazole (NIZORAL) 2 % cream Apply 1 application topically 2 (two) times daily. On rash 90 g 1   ketoconazole (NIZORAL) 200 MG tablet Take 1 tablet (200 mg total) by mouth daily. 10 tablet 0   levothyroxine (SYNTHROID) 88 MCG tablet TAKE 1 TABLET(88 MCG) BY MOUTH DAILY 90 tablet 3   nitroGLYCERIN (NITROSTAT) 0.4 MG SL tablet USE AS DIRECTED SUBLINGUALLY 25 tablet 3   Omega-3 Fatty Acids (FISH OIL) 1000 MG CAPS Take 1 capsule by mouth daily.     phenytoin (DILANTIN) 100 MG ER capsule TAKE 2 CAPSULES BY MOUTH EVERY MORNING AND 1 CAPSULE EVERY EVENING 270 capsule 3   PREVIDENT 5000 BOOSTER PLUS 1.1 % PSTE BRUSH BY MOUTH TWICE DAILY     promethazine (PHENERGAN) 12.5 MG tablet 12.5 mg every 6 (six) hours as needed.      Promethazine-Codeine 6.25-10 MG/5ML SOLN Take 5 mLs by mouth every 6 (six) hours as needed. 300 mL 1   Respiratory Therapy  Supplies (FLUTTER) DEVI Use after nebulization treatments 1 each 0   sodium chloride (MURO 128) 5 % ophthalmic solution Place 1 drop into both eyes as needed for irritation.      SYRINGE-NEEDLE, DISP, 3 ML (BD ECLIPSE SYRINGE) 25G X 1" 3 ML MISC Use sq q 2 wks 50 each 3   traZODone (DESYREL) 50 MG tablet TAKE 4 TABLETS(200 MG) BY MOUTH AT BEDTIME 360 tablet 3   verapamil (CALAN-SR) 240 MG CR tablet TAKE 1 TABLET(240 MG) BY MOUTH AT BEDTIME 90 tablet 3   No facility-administered medications prior to visit.    ROS: Review of Systems  Constitutional: Positive for fatigue. Negative for activity change, appetite change, chills and unexpected weight change.  HENT: Negative for congestion, mouth sores and sinus  pressure.   Eyes: Negative for visual disturbance.  Respiratory: Positive for cough and shortness of breath. Negative for chest tightness.   Gastrointestinal: Positive for abdominal distention. Negative for abdominal pain and nausea.  Genitourinary: Negative for difficulty urinating, frequency and vaginal pain.  Musculoskeletal: Positive for arthralgias, back pain and gait problem.  Skin: Negative for pallor and rash.  Neurological: Positive for tremors and weakness. Negative for dizziness, numbness and headaches.  Hematological: Bruises/bleeds easily.  Psychiatric/Behavioral: Negative for confusion, sleep disturbance and suicidal ideas. The patient is nervous/anxious.     Objective:  BP 128/72 (BP Location: Left Arm)    Pulse 74    Temp 98.9 F (37.2 C) (Oral)    Wt 160 lb 6.4 oz (72.8 kg)    SpO2 97%    BMI 31.33 kg/m   BP Readings from Last 3 Encounters:  02/28/20 128/72  02/12/20 118/70  01/04/20 (!) 145/64    Wt Readings from Last 3 Encounters:  02/28/20 160 lb 6.4 oz (72.8 kg)  02/12/20 159 lb 4 oz (72.2 kg)  01/04/20 155 lb (70.3 kg)    Physical Exam Constitutional:      General: She is not in acute distress.    Appearance: She is well-developed. She is obese. She is toxic-appearing.  HENT:     Head: Normocephalic.     Right Ear: External ear normal.     Left Ear: External ear normal.     Nose: Nose normal.     Mouth/Throat:     Mouth: Oropharynx is clear and moist.  Eyes:     General:        Right eye: No discharge.        Left eye: No discharge.     Conjunctiva/sclera: Conjunctivae normal.     Pupils: Pupils are equal, round, and reactive to light.  Neck:     Thyroid: No thyromegaly.     Vascular: No JVD.     Trachea: No tracheal deviation.  Cardiovascular:     Rate and Rhythm: Normal rate and regular rhythm.     Heart sounds: Normal heart sounds.  Pulmonary:     Effort: No respiratory distress.     Breath sounds: No stridor. No wheezing.  Abdominal:      General: Bowel sounds are normal. There is no distension.     Palpations: Abdomen is soft. There is no mass.     Tenderness: There is no abdominal tenderness. There is no guarding or rebound.  Musculoskeletal:        General: Tenderness present. No edema.     Cervical back: Normal range of motion and neck supple.  Lymphadenopathy:     Cervical: No cervical adenopathy.  Skin:  Findings: No erythema or rash.  Neurological:     Mental Status: She is oriented to person, place, and time.     Cranial Nerves: No cranial nerve deficit.     Motor: No abnormal muscle tone.     Coordination: Coordination abnormal.     Gait: Gait abnormal.     Deep Tendon Reflexes: Reflexes normal.  Psychiatric:        Mood and Affect: Mood and affect normal.        Behavior: Behavior normal.        Thought Content: Thought content normal.        Judgment: Judgment normal.   Cane  Leg brace Coughing a lot  Lab Results  Component Value Date   WBC 10.8 (H) 08/23/2019   HGB 12.4 08/23/2019   HCT 36.9 08/23/2019   PLT 279.0 08/23/2019   GLUCOSE 68 (L) 02/12/2020   CHOL 249 (H) 11/10/2018   TRIG 88.0 11/10/2018   HDL 57.70 11/10/2018   LDLDIRECT 165.0 09/26/2009   LDLCALC 174 (H) 11/10/2018   ALT 13 12/03/2019   AST 22 12/03/2019   NA 141 02/12/2020   K 3.8 02/12/2020   CL 98 02/12/2020   CREATININE 0.78 02/12/2020   BUN 5 (L) 02/12/2020   CO2 35 (H) 02/12/2020   TSH 2.22 11/10/2018   INR 1.0 ratio 11/07/2009   HGBA1C 5.6 08/23/2019    MR 3D Recon At Scanner  Result Date: 02/12/2019 CLINICAL DATA:  Right upper quadrant abdominal pain. Suspected cholecystitis. EXAM: MRI ABDOMEN WITHOUT AND WITH CONTRAST (INCLUDING MRCP) TECHNIQUE: Multiplanar multisequence MR imaging of the abdomen was performed both before and after the administration of intravenous contrast. Heavily T2-weighted images of the biliary and pancreatic ducts were obtained, and three-dimensional MRCP images were rendered by  post processing. CONTRAST:  109mL GADAVIST GADOBUTROL 1 MMOL/ML IV SOLN COMPARISON:  Abdominal ultrasound 08/07/2015. Abdominal CT 08/31/2010. FINDINGS: Despite efforts by the technologist and patient, mild motion artifact is present on today's exam and could not be eliminated. This reduces exam sensitivity and specificity. Lower chest: There are no significant findings within the visualized lower chest. Hepatobiliary: Small hepatic cysts are noted. There are new suspicious hepatic findings. The gallbladder is surgically absent. There is mild fusiform extrahepatic biliary dilatation with the common hepatic duct measuring up to 8 mm in diameter. The duct tapers distally and appears similar to previous CT. No evidence of choledocholithiasis or intrahepatic biliary dilatation. Pancreas: Unremarkable. No pancreatic ductal dilatation or surrounding inflammatory changes. Stable prominent fat between the pancreatic head and the superior mesenteric vein. Spleen: Normal in size without focal abnormality. Adrenals/Urinary Tract: Both adrenal glands appear normal. Small right renal cyst. No evidence of renal mass or hydronephrosis. Stomach/Bowel: No evidence of bowel wall thickening, distention or surrounding inflammatory change. Vascular/Lymphatic: There are no enlarged abdominal lymph nodes. Aortic and branch vessel atherosclerosis. No acute vascular findings identified. Other: The visualized anterior abdominal wall appears intact. No ascites. Musculoskeletal: No acute or significant osseous findings. Facet hypertrophy and associated synovial enhancement noted within the mid lumbar spine. IMPRESSION: 1. No acute findings or explanation for the patient's symptoms. 2. Mild fusiform extrahepatic biliary dilatation status post cholecystectomy, similar to previous CT. No evidence of choledocholithiasis or intrahepatic biliary dilatation. 3.  Aortic Atherosclerosis (ICD10-I70.0). Electronically Signed   By: Richardean Sale M.D.    On: 02/12/2019 13:07   MR ABD/MRCP  Result Date: 02/12/2019 CLINICAL DATA:  Right upper quadrant abdominal pain. Suspected cholecystitis. EXAM: MRI ABDOMEN WITHOUT  AND WITH CONTRAST (INCLUDING MRCP) TECHNIQUE: Multiplanar multisequence MR imaging of the abdomen was performed both before and after the administration of intravenous contrast. Heavily T2-weighted images of the biliary and pancreatic ducts were obtained, and three-dimensional MRCP images were rendered by post processing. CONTRAST:  60mL GADAVIST GADOBUTROL 1 MMOL/ML IV SOLN COMPARISON:  Abdominal ultrasound 08/07/2015. Abdominal CT 08/31/2010. FINDINGS: Despite efforts by the technologist and patient, mild motion artifact is present on today's exam and could not be eliminated. This reduces exam sensitivity and specificity. Lower chest: There are no significant findings within the visualized lower chest. Hepatobiliary: Small hepatic cysts are noted. There are new suspicious hepatic findings. The gallbladder is surgically absent. There is mild fusiform extrahepatic biliary dilatation with the common hepatic duct measuring up to 8 mm in diameter. The duct tapers distally and appears similar to previous CT. No evidence of choledocholithiasis or intrahepatic biliary dilatation. Pancreas: Unremarkable. No pancreatic ductal dilatation or surrounding inflammatory changes. Stable prominent fat between the pancreatic head and the superior mesenteric vein. Spleen: Normal in size without focal abnormality. Adrenals/Urinary Tract: Both adrenal glands appear normal. Small right renal cyst. No evidence of renal mass or hydronephrosis. Stomach/Bowel: No evidence of bowel wall thickening, distention or surrounding inflammatory change. Vascular/Lymphatic: There are no enlarged abdominal lymph nodes. Aortic and branch vessel atherosclerosis. No acute vascular findings identified. Other: The visualized anterior abdominal wall appears intact. No ascites. Musculoskeletal:  No acute or significant osseous findings. Facet hypertrophy and associated synovial enhancement noted within the mid lumbar spine. IMPRESSION: 1. No acute findings or explanation for the patient's symptoms. 2. Mild fusiform extrahepatic biliary dilatation status post cholecystectomy, similar to previous CT. No evidence of choledocholithiasis or intrahepatic biliary dilatation. 3.  Aortic Atherosclerosis (ICD10-I70.0). Electronically Signed   By: Richardean Sale M.D.   On: 02/12/2019 13:07    Assessment & Plan:    Walker Kehr, MD

## 2020-02-28 NOTE — Assessment & Plan Note (Signed)
Chronic  Tylenol #3 prn  Potential benefits of a long term opioids use as well as potential risks (i.e. addiction risk, apnea etc) and complications (i.e. Somnolence, constipation and others) were explained to the patient and were aknowledged.

## 2020-02-28 NOTE — Assessment & Plan Note (Signed)
On B12 

## 2020-02-28 NOTE — Assessment & Plan Note (Signed)
On Dilantin

## 2020-02-28 NOTE — Patient Instructions (Addendum)
Try Lipitor 1/2 every other day due to cramps after a 1 week break     B-complex with Niacin 100 mg    Lion's mane

## 2020-02-28 NOTE — Assessment & Plan Note (Signed)
Vit D 

## 2020-02-28 NOTE — Assessment & Plan Note (Signed)
Calan SR

## 2020-02-28 NOTE — Assessment & Plan Note (Signed)
Try Lipitor 1/2 every other day due to cramps after a 1 week break

## 2020-02-28 NOTE — Assessment & Plan Note (Signed)
Try Lipitor 1/2 every other day due to cramps

## 2020-03-03 ENCOUNTER — Ambulatory Visit: Payer: Medicare Other | Admitting: Psychology

## 2020-03-06 ENCOUNTER — Ambulatory Visit: Payer: Medicare Other | Admitting: Internal Medicine

## 2020-03-10 ENCOUNTER — Ambulatory Visit (INDEPENDENT_AMBULATORY_CARE_PROVIDER_SITE_OTHER): Payer: Medicare Other

## 2020-03-10 ENCOUNTER — Other Ambulatory Visit: Payer: Self-pay

## 2020-03-10 VITALS — BP 120/78 | HR 63 | Temp 98.1°F | Ht 60.0 in | Wt 157.6 lb

## 2020-03-10 DIAGNOSIS — Z0000 Encounter for general adult medical examination without abnormal findings: Secondary | ICD-10-CM
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
## 2020-03-10 NOTE — Patient Instructions (Signed)
Veronica [name] , Thank you for taking time to come for your Medicare Wellness Visit. I appreciate your ongoing commitment to your health goals. Please review the following plan we discussed and let me know if I can assist you in the future.   Screening recommendations/referrals: Colonoscopy: 03/23/2019; due every 5 years Mammogram: 04/13/2017 Bone Density: 04/27/2017 Recommended yearly ophthalmology/optometry visit for glaucoma screening and checkup Recommended yearly dental visit for hygiene and checkup  Vaccinations: Influenza vaccine: 11/30/2019 Pneumococcal vaccine: up to date Tdap vaccine: 02/11/2014; due every 10 years Shingles vaccine: never done   Covid-19: up to date  Advanced directives: Please bring a copy of your health care power of attorney and living will to the office at your convenience.  Conditions/risks identified: Yes; Reviewed health maintenance screenings with patient today and relevant education, vaccines, and/or referrals were provided. Please continue to do your personal lifestyle choices by: daily care of teeth and gums, regular physical activity (goal should be 5 days a week for 30 minutes), eat a healthy diet, avoid tobacco and drug use, limiting any alcohol intake, taking a low-dose aspirin (if not allergic or have been advised by your provider otherwise) and taking vitamins and minerals as recommended by your provider. Continue doing brain stimulating activities (puzzles, reading, adult coloring books, staying active) to keep memory sharp. Continue to eat heart healthy diet (full of fruits, vegetables, whole grains, lean protein, water--limit salt, fat, and sugar intake) and increase physical activity as tolerated.  Next appointment: Please schedule your next Medicare Wellness Visit with your Nurse Health Advisor in 1 year by calling [phone].  Preventive Care 70 Years and Older, Female Preventive care refers to lifestyle choices and visits with your health care  provider that can promote health and wellness. What does preventive care include?  A yearly physical exam. This is also called an annual well check.  Dental exams once or twice a year.  Routine eye exams. Ask your health care provider how often you should have your eyes checked.  Personal lifestyle choices, including:  Daily care of your teeth and gums.  Regular physical activity.  Eating a healthy diet.  Avoiding tobacco and drug use.  Limiting alcohol use.  Practicing safe sex.  Taking low-dose aspirin every day.  Taking vitamin and mineral supplements as recommended by your health care provider. What happens during an annual well check? The services and screenings done by your health care provider during your annual well check will depend on your age, overall health, lifestyle risk factors, and family history of disease. Counseling  Your health care provider may ask you questions about your:  Alcohol use.  Tobacco use.  Drug use.  Emotional well-being.  Home and relationship well-being.  Sexual activity.  Eating habits.  History of falls.  Memory and ability to understand (cognition).  Work and work Statistician.  Reproductive health. Screening  You may have the following tests or measurements:  Height, weight, and BMI.  Blood pressure.  Lipid and cholesterol levels. These may be checked every 5 years, or more frequently if you are over 45 years old.  Skin check.  Lung cancer screening. You may have this screening every year starting at age 70 if you have a 30-pack-year history of smoking and currently smoke or have quit within the past 15 years.  Fecal occult blood test (FOBT) of the stool. You may have this test every year starting at age 70.  Flexible sigmoidoscopy or colonoscopy. You may have a sigmoidoscopy every 5 years  or a colonoscopy every 10 years starting at age 70.  Hepatitis C blood test.  Hepatitis B blood test.  Sexually  transmitted disease (STD) testing.  Diabetes screening. This is done by checking your blood sugar (glucose) after you have not eaten for a while (fasting). You may have this done every 1-3 years.  Bone density scan. This is done to screen for osteoporosis. You may have this done starting at age 70.  Mammogram. This may be done every 1-2 years. Talk to your health care provider about how often you should have regular mammograms. Talk with your health care provider about your test results, treatment options, and if necessary, the need for more tests. Vaccines  Your health care provider may recommend certain vaccines, such as:  Influenza vaccine. This is recommended every year.  Tetanus, diphtheria, and acellular pertussis (Tdap, Td) vaccine. You may need a Td booster every 10 years.  Zoster vaccine. You may need this after age 38.  Pneumococcal 13-valent conjugate (PCV13) vaccine. One dose is recommended after age 70.  Pneumococcal polysaccharide (PPSV23) vaccine. One dose is recommended after age 70. Talk to your health care provider about which screenings and vaccines you need and how often you need them. This information is not intended to replace advice given to you by your health care provider. Make sure you discuss any questions you have with your health care provider. Document Released: 03/28/2015 Document Revised: 11/19/2015 Document Reviewed: 12/31/2014 Elsevier Interactive Patient Education  2017 [employer].  Fall Prevention in the Home Falls can cause injuries. They can happen to people of all ages. There are many things you can do to make your home safe and to help prevent falls. What can I do on the outside of my home?  Regularly fix the edges of walkways and driveways and fix any cracks.  Remove anything that might make you trip as you walk through a door, such as a raised step or threshold.  Trim any bushes or trees on the path to your home.  Use bright outdoor  lighting.  Clear any walking paths of anything that might make someone trip, such as rocks or tools.  Regularly check to see if handrails are loose or broken. Make sure that both sides of any steps have handrails.  Any raised decks and porches should have guardrails on the edges.  Have any leaves, snow, or ice cleared regularly.  Use sand or salt on walking paths during winter.  Clean up any spills in your garage right away. This includes oil or grease spills. What can I do in the bathroom?  Use night lights.  Install grab bars by the toilet and in the tub and shower. Do not use towel bars as grab bars.  Use non-skid mats or decals in the tub or shower.  If you need to sit down in the shower, use a plastic, non-slip stool.  Keep the floor dry. Clean up any water that spills on the floor as soon as it happens.  Remove soap buildup in the tub or shower regularly.  Attach bath mats securely with double-sided non-slip rug tape.  Do not have throw rugs and other things on the floor that can make you trip. What can I do in the bedroom?  Use night lights.  Make sure that you have a light by your bed that is easy to reach.  Do not use any sheets or blankets that are too big for your bed. They should not hang down  onto the floor.  Have a firm chair that has side arms. You can use this for support while you get dressed.  Do not have throw rugs and other things on the floor that can make you trip. What can I do in the kitchen?  Clean up any spills right away.  Avoid walking on wet floors.  Keep items that you use a lot in easy-to-reach places.  If you need to reach something above you, use a strong step stool that has a grab bar.  Keep electrical cords out of the way.  Do not use floor polish or wax that makes floors slippery. If you must use wax, use non-skid floor wax.  Do not have throw rugs and other things on the floor that can make you trip. What can I do with my  stairs?  Do not leave any items on the stairs.  Make sure that there are handrails on both sides of the stairs and use them. Fix handrails that are broken or loose. Make sure that handrails are as long as the stairways.  Check any carpeting to make sure that it is firmly attached to the stairs. Fix any carpet that is loose or worn.  Avoid having throw rugs at the top or bottom of the stairs. If you do have throw rugs, attach them to the floor with carpet tape.  Make sure that you have a light switch at the top of the stairs and the bottom of the stairs. If you do not have them, ask someone to add them for you. What else can I do to help prevent falls?  Wear shoes that:  Do not have high heels.  Have rubber bottoms.  Are comfortable and fit you well.  Are closed at the toe. Do not wear sandals.  If you use a stepladder:  Make sure that it is fully opened. Do not climb a closed stepladder.  Make sure that both sides of the stepladder are locked into place.  Ask someone to hold it for you, if possible.  Clearly mark and make sure that you can see:  Any grab bars or handrails.  First and last steps.  Where the edge of each step is.  Use tools that help you move around (mobility aids) if they are needed. These include:  Canes.  Walkers.  Scooters.  Crutches.  Turn on the lights when you go into a dark area. Replace any light bulbs as soon as they burn out.  Set up your furniture so you have a clear path. Avoid moving your furniture around.  If any of your floors are uneven, fix them.  If there are any pets around you, be aware of where they are.  Review your medicines with your doctor. Some medicines can make you feel dizzy. This can increase your chance of falling. Ask your doctor what other things that you can do to help prevent falls. This information is not intended to replace advice given to you by your health care provider. Make sure you discuss any  questions you have with your health care provider. Document Released: 12/26/2008 Document Revised: 08/07/2015 Document Reviewed: 04/05/2014 Elsevier Interactive Patient Education  2017 [employer].

## 2020-03-12 ENCOUNTER — Ambulatory Visit (INDEPENDENT_AMBULATORY_CARE_PROVIDER_SITE_OTHER): Payer: Medicare Other

## 2020-03-12 ENCOUNTER — Other Ambulatory Visit: Payer: Self-pay

## 2020-03-12 DIAGNOSIS — M810 Age-related osteoporosis without current pathological fracture: Secondary | ICD-10-CM | POA: Diagnosis not present

## 2020-03-12 MED ORDER — DENOSUMAB 60 MG/ML ~~LOC~~ SOSY
60.0000 mg | PREFILLED_SYRINGE | Freq: Once | SUBCUTANEOUS | Status: AC
  Administered 2020-03-12: 14:00:00 60 mg via SUBCUTANEOUS

## 2020-03-12 NOTE — Progress Notes (Addendum)
Prolia 60mg  given subcutaneous per Dr order.  Prolia given in right arm & pt tolerated well.  Medical screening examination/treatment/procedure(s) were performed by non-physician practitioner and as supervising physician I was immediately available for consultation/collaboration.  I agree with above. Posey Rea, MD

## 2020-03-12 NOTE — Telephone Encounter (Signed)
Approved to start on 03/14/20-03/14/21.  Order will arrive 7-10 business days after 03/14/20 release.  LVM with pt notifying her of the above.

## 2020-03-12 NOTE — Telephone Encounter (Signed)
Pt here for B12 injection & inquiring about status of Vraylar PAP.  Given 2 weeks sample of Vraylar 1.5mg  that was prescribed & instructions for use reviewed.  Pt verb understanding. Will check the status of PAP with myabbvie.

## 2020-03-17 ENCOUNTER — Ambulatory Visit (INDEPENDENT_AMBULATORY_CARE_PROVIDER_SITE_OTHER): Payer: Medicare Other | Admitting: Psychology

## 2020-03-17 ENCOUNTER — Ambulatory Visit: Payer: Medicare Other | Admitting: Psychology

## 2020-03-17 DIAGNOSIS — F331 Major depressive disorder, recurrent, moderate: Secondary | ICD-10-CM | POA: Diagnosis not present

## 2020-03-19 ENCOUNTER — Other Ambulatory Visit: Payer: Self-pay | Admitting: *Deleted

## 2020-03-19 MED ORDER — CARIPRAZINE HCL 1.5 MG PO CAPS
1.5000 mg | ORAL_CAPSULE | Freq: Every day | ORAL | 0 refills | Status: DC
Start: 2020-03-19 — End: 2021-03-05

## 2020-03-19 NOTE — Patient Outreach (Addendum)
[facility]) Care Management  03/19/2020  KAI RAILSBACK 1950-06-29 [id number]  Successful telephone outreach call to patient. HIPAA identifiers obtained. Nurse called patient back in response to a VM she left this nurse. Patient stated that she needed to reschedule her upcoming appointment and requested to be called on 04/16/20.  Plan: RN Health Coach will call patient on 04/16/20.  Blanchie Serve RN, BSN [facility] Care Management  RN Health Coach [phone] [email or web address]

## 2020-03-19 NOTE — Telephone Encounter (Signed)
Received another fax stating pt assistance for med Vraylar 1.5 mg was approved. Called pt inform her of status. We have not received any med yet, but once it gets here inform her we will call her. Pt states she is almost out and would like some samples. Inform pt will leave at front desk for p/u.Marland KitchenRaechel Chute

## 2020-03-19 NOTE — Addendum Note (Signed)
Addended by: Deatra James on: 03/19/2020 12:08 PM   Modules accepted: Orders

## 2020-03-28 ENCOUNTER — Telehealth: Payer: Self-pay | Admitting: *Deleted

## 2020-03-28 NOTE — Telephone Encounter (Signed)
Called pt to let her know her Pt Assistance medication has came in. There was no answer LMOM will leaver up front foe pick-up.Marland KitchenJohny Chess

## 2020-03-31 ENCOUNTER — Ambulatory Visit (INDEPENDENT_AMBULATORY_CARE_PROVIDER_SITE_OTHER): Payer: Medicare Other | Admitting: Psychology

## 2020-03-31 DIAGNOSIS — F331 Major depressive disorder, recurrent, moderate: Secondary | ICD-10-CM

## 2020-04-08 ENCOUNTER — Ambulatory Visit: Payer: Medicare Other | Admitting: *Deleted

## 2020-04-09 ENCOUNTER — Other Ambulatory Visit: Payer: Self-pay | Admitting: Internal Medicine

## 2020-04-14 ENCOUNTER — Ambulatory Visit: Payer: Medicare Other | Admitting: Psychology

## 2020-04-16 ENCOUNTER — Other Ambulatory Visit: Payer: Self-pay | Admitting: *Deleted

## 2020-04-16 ENCOUNTER — Encounter: Payer: Self-pay | Admitting: *Deleted

## 2020-04-16 NOTE — Patient Instructions (Addendum)
Goals Addressed            This Visit's Progress   . Eat Healthy       Follow Up Date 03/13/20    - set goal weight - manage portion size - prepare main meal at home 3 to 5 days each week - read food labels for fat, fiber, carbohydrates and portion size - set a realistic goal   - drink at least 4 glasses of water dail  Why is this important?   When you are ready to manage your nutrition or weight, having a plan and setting goals will help.  Taking small steps to change how you eat and exercise is a good place to start.    Notes: Patient monitors sodium content in foods and reports she is making healthier food choices. Nurse sent patient Planning Healthy Meals booklet.   Updated 04/16/20: Patient states she will try to eat 3 servings of fruits/vegetables daily, monitor her salt intake, and will drink 4 glasses of water daily. States she does want to lose weight by continuing to eat healthier, do chair exercises and be as active as tolerated.     . Learn and Do Breathing Exercises       Timeframe:  Long-Range Goal Priority:  High Start Date:   12/19/19                          Expected End Date: 07/12/20                     Follow Up Date 07/12/20   - do breathing exercises every day - do exercises in a comfortable position that makes breathing as easy as possible    Why is this important?   Breathing exercises can help lessen the cough that comes with chronic obstructive pulmonary disease.  Doing the exercises will give you more energy.  They will also help you to control your symptoms.    Notes: Patient does deep breathing exercises and uses her flutter device twice daily. She explains that she walks around her home as much as possible and does chair exercises to help improve her respiratory status and maintain her strength. Updated: 01/29/20  Updated 04/16/20: Patient continues to do her breathing exercises and uses her flutter device twice daily. She reports maintaining her  home, does chair exercises 2-3 times weekly, and goes out daily to stay active.     . Manage Fatigue (Tiredness)       Follow Up Date 03/13/20    - eat healthy - get at least 7 to 8 hours of sleep at night - get outdoors every day (weather permitting) - keep room cool and dark - practice relaxation or meditation daily - use devices that will help like a cane, sock-puller or reacher    Why is this important?   Feeling tired or worn out is a common symptom of COPD (chronic obstructive pulmonary disease).  Learning when you feel your best and when you need rest is important.  Managing the tiredness (fatigue) will help you be active and enjoy life.     Notes: Nurse sent patient Planning Healthy Meals booklet. Patient does get out of the house daily (weather and breathing permitting). She does use a straight cane and plans to discuss getting a quad cane with PCP during her upcoming appointment on 02/28/20. Updated: 01/29/20   Updated 04/16/20: Patient states she does sleep well currently. She  does purse lip breathing and rest when she become short of breath. Patient uses her cane and plans to ask provider for a prescription for a quad cane on her next follow up visit. Patient goes out daily weather permitting to get her ice tea at [employer]. Patient states she is going to eat healthier by increasing her fruit/vegetable intake to 3 servings per day and limit her salt intake.    . COMPLETED: Patient will not have any ED or hospital visits within the next 90  days       [place] (see longtitudinal plan of care for additional care plan information)  Current Barriers:  [name] Knowledge deficits related to basic COPD self care/management   Case Manager Clinical Goal(s):  Over the next 90 days, patient will be able to verbalize understanding of COPD action plan and when to seek appropriate levels of medical care  Over the next 90 days, patient will engage in lite exercise as tolerated to  build/regain stamina and strength and reduce shortness of breath through activity tolerance  Over the next 90 days, patient will not be hospitalized for COPD exacerbation   Interventions:   Provided patient with COPD action plan and reinforced importance of daily self assessment  Advised patient to self assesses COPD action plan zone and make appointment with provider if in the yellow zone for 48 hours without improvement.  Advised patient to engage in light exercise as tolerated 3-5 days a week  Patient Self Care Activities:  . Takes medications as prescribed including inhalers . Practices and uses pursed lip breathing for shortness of breath recovery and prevention . Self assesses COPD action plan zone and makes appointment with provider if in the yellow zone for 48 hours without improvement. . Patient increases her physical activity as tolerated based upon her respiratory status.  Please see past updates related to this goal by clicking on the "Past Updates" button in the selected goal  Updated: 01/29/20  Resolved due to duplicate goals 05/15/18     . Track and Manage My Symptoms       Timeframe:  Long-Range Goal Priority:  High Start Date:   12/19/19                          Expected End Date:  09/11/20                     Follow Up Date 07/12/20   - develop a rescue plan - eliminate symptom triggers at home - follow rescue plan if symptoms flare-up - keep follow-up appointments    Why is this important?   Tracking your symptoms and other information about your health helps your doctor plan your care.  Write down the symptoms, the time of day, what you were doing and what medicine you are taking.  You will soon learn how to manage your symptoms.     Notes: Patient does eliminate symptom triggers at home. Nurse sent her COPD zones and action plans. Patient does contact her provider as needed when she has an COPD exacerbation. Updated: 01/29/20 Updated 04/16/20: Patient tracks  her COPD symptoms daily and calls her providers when symptoms start to get out of her baseline. Patient limits her outside activity during very cold and hot days. She has eliminated triggers within her home. Patient follows her COPD regiment at home daily of taking her inhalers, nebulizers, and using flutter device.

## 2020-04-16 NOTE — Patient Outreach (Signed)
[facility]) Care Management  [place]  04/16/2020   CEBRINA HERINGTON 06-04-1950 [id number]  Subjective: Successful telephone outreach call to patient. HIPAA identifiers obtained. Patient reports that she is doing fairly well. She is trying to get over having  2 teeth pulled which caused her mouth to swell and limit the amount she is able to eat. Patient explains she is supplementing with Boost, staying hydrated, and is slowly introducing soft foods into her diet. Nurse will send Ensure coupons.  Patient states she is at baseline with her COPD. She uses her rescue inhaler 1-3 times daily and her nebulizer and flutter device twice daily which is typical for her. Patient continues with her goal of getting out of the house daily to boost her moral, she walks around her home doing small house-holds tasks, and rests when needed. Patient does chair exercises to maintain her strength, she is adhering to a low sodium diet, and she is making healthier food choices. Patient states her home environment is safe and denies having any recent falls. Patient did not have any further questions or concerns today and did confirm that the patient has this nurse's contact number to call her if needed.   Encounter Medications:  Outpatient Encounter Medications as of 04/16/2020  Medication Sig Note  . acetaminophen-codeine (TYLENOL #3) 300-30 MG tablet Take 1 tablet by mouth every 4 (four) hours as needed.   [name] albuterol (PROVENTIL) (2.5 MG/3ML) 0.083% nebulizer solution USE 1 VIAL IN NEBULIZER 2 TIMES DAILY. Generic: VENTOLIN 07/10/2019: Uses 1 vial two times daily.  [name] albuterol (VENTOLIN HFA) 108 (90 Base) MCG/ACT inhaler INHALE 1 TO 2 PUFFS INTO THE LUNGS EVERY 4 HOURS AS NEEDED FOR WHEEZING OR SHORTNESS OF BREATH   . aspirin 81 MG chewable tablet Chew by mouth daily.   [name] atorvastatin (LIPITOR) 40 MG tablet TAKE 1 TABLET(40 MG) BY MOUTH DAILY   . B-D INSULIN SYRINGE 1CC/25GX1" 25G X 1" 1 ML MISC  USE AS DIRECTED EVERY 2 WEEKS   . budesonide (PULMICORT) 0.25 MG/2ML nebulizer solution USE 1 VIAL IN NEBULIZER 2 TIMES DAILY. Generic: PULMICORT   . calcium carbonate (OSCAL) 1500 (600 Ca) MG TABS tablet Take 600 mg of elemental calcium by mouth 2 (two) times daily with a meal.    . cariprazine (VRAYLAR) capsule Take 1 capsule (1.5 mg total) by mouth daily. Lot # [id number] EXP 0701/22   . Cholecalciferol (VITAMIN D3) 50 MCG (2000 UT) capsule Take 1 capsule (2,000 Units total) by mouth daily.   . clonazePAM (KLONOPIN) 1 MG tablet TAKE 2 TABLETS BY MOUTH EVERY NIGHT AT BEDTIME AND 1/2 TABLET EXTRA IN DAYTIME AS NEEDED   . cyanocobalamin (,VITAMIN B-12,) 1000 MCG/ML injection INJECT 1ML IN THE MUSCLE EVERY 14 DAYS.   [name] Cyanocobalamin (VITAMIN B-12 PO) Take 5,000 mcg by mouth daily.   . diphenoxylate-atropine (LOMOTIL) 2.5-0.025 MG tablet Take 1-2 tablets by mouth 4 (four) times daily as needed for diarrhea or loose stools. 07/10/2019: Last used 2 weeks ago.  Mariane Baumgarten Calcium (STOOL SOFTENER PO) Take 100 mg by mouth as needed (for constipation).  07/10/2019: Last used 2 weeks ago.  . escitalopram (LEXAPRO) 20 MG tablet Take 1 tablet (20 mg total) by mouth daily.   . formoterol (PERFOROMIST) 20 MCG/2ML nebulizer solution Take 2 mLs (20 mcg total) by nebulization 2 (two) times daily.   . furosemide (LASIX) 20 MG tablet TAKE 1 TABLET(20 MG) BY MOUTH DAILY AS NEEDED 04/16/2020: Patient takes daily  .  ketoconazole (NIZORAL) 2 % cream Apply 1 application topically 2 (two) times daily. On rash   . ketoconazole (NIZORAL) 200 MG tablet Take 1 tablet (200 mg total) by mouth daily.   [name] levothyroxine (SYNTHROID) 88 MCG tablet TAKE 1 TABLET(88 MCG) BY MOUTH DAILY   . nitroGLYCERIN (NITROSTAT) 0.4 MG SL tablet USE AS DIRECTED SUBLINGUALLY   . Omega-3 Fatty Acids (FISH OIL) 1000 MG CAPS Take 1 capsule by mouth daily.   . phenytoin (DILANTIN) 100 MG ER capsule TAKE 2 CAPSULES BY MOUTH EVERY MORNING AND 1 CAPSULE EVERY  EVENING   . PREVIDENT 5000 BOOSTER PLUS 1.1 % PSTE BRUSH BY MOUTH TWICE DAILY   . promethazine (PHENERGAN) 12.5 MG tablet 12.5 mg every 6 (six) hours as needed.  07/10/2019: Using as needed for nausea associated with migraines.  Last used 3 weeks ago.  . Promethazine-Codeine 6.25-10 MG/5ML SOLN Take 5 mLs by mouth every 6 (six) hours as needed. Do not take with Tylenol 3   . Respiratory Therapy Supplies (FLUTTER) DEVI Use after nebulization treatments   . sodium chloride (MURO 128) 5 % ophthalmic solution Place 1 drop into both eyes as needed for irritation.  07/10/2019: She is using daily.  . SYRINGE-NEEDLE, DISP, 3 ML (BD ECLIPSE SYRINGE) 25G X 1" 3 ML MISC Use sq q 2 wks   . traZODone (DESYREL) 50 MG tablet TAKE 4 TABLETS(200 MG) BY MOUTH AT BEDTIME   . verapamil (CALAN-SR) 240 MG CR tablet TAKE 1 TABLET(240 MG) BY MOUTH AT BEDTIME    No facility-administered encounter medications on file as of 04/16/2020.    Functional Status:  In your present state of health, do you have any difficulty performing the following activities: 03/10/2020 05/08/2019  Hearing? N N  Vision? N N  Difficulty concentrating or making decisions? Y N  Walking or climbing stairs? N N  Dressing or bathing? N N  Doing errands, shopping? N N  Preparing Food and eating ? N N  Using the Toilet? N N  In the past six months, have you accidently leaked urine? Y N  Do you have problems with loss of bowel control? N N  Managing your Medications? N N  Managing your Finances? N N  Housekeeping or managing your Housekeeping? N N  Some recent data might be hidden    Fall/Depression Screening: Fall Risk  04/16/2020 04/16/2020 03/10/2020  Falls in the past year? 1 1 1   Comment - - -  Number falls in past yr: 0 0 0  Comment - - -  Injury with Fall? 0 0 1  Risk for fall due to : Impaired mobility;Impaired balance/gait;History of fall(s) Impaired mobility;Impaired balance/gait;History of fall(s) Impaired balance/gait;Medication side  effect  Follow up Falls prevention discussed;Education provided;Falls evaluation completed Falls evaluation completed;Education provided;Falls prevention discussed Falls evaluation completed;Education provided   [facility] 2/9 Scores 04/16/2020 03/10/2020 12/19/2019 11/22/2019 05/08/2019 02/27/2019 02/21/2018  PHQ - 2 Score 3 1 2  0 4 4 4   PHQ- 9 Score 9 - 8 0 7 8 9     Assessment:  Goals Addressed            This Visit's Progress   . Eat Healthy       Follow Up Date 03/13/20    - set goal weight - manage portion size - prepare main meal at home 3 to 5 days each week - read food labels for fat, fiber, carbohydrates and portion size - set a realistic goal   - drink at least  4 glasses of water dail  Why is this important?   When you are ready to manage your nutrition or weight, having a plan and setting goals will help.  Taking small steps to change how you eat and exercise is a good place to start.    Notes: Patient monitors sodium content in foods and reports she is making healthier food choices. Nurse sent patient Planning Healthy Meals booklet.   Updated 04/16/20: Patient states she will try to eat 3 servings of fruits/vegetables daily, monitor her salt intake, and will drink 4 glasses of water daily. States she does want to lose weight by continuing to eat healthier, do chair exercises and be as active as tolerated.     . Learn and Do Breathing Exercises       Timeframe:  Long-Range Goal Priority:  High Start Date:   12/19/19                          Expected End Date: 07/12/20                     Follow Up Date 07/12/20   - do breathing exercises every day - do exercises in a comfortable position that makes breathing as easy as possible    Why is this important?   Breathing exercises can help lessen the cough that comes with chronic obstructive pulmonary disease.  Doing the exercises will give you more energy.  They will also help you to control your symptoms.    Notes: Patient does  deep breathing exercises and uses her flutter device twice daily. She explains that she walks around her home as much as possible and does chair exercises to help improve her respiratory status and maintain her strength. Updated: 01/29/20  Updated 04/16/20: Patient continues to do her breathing exercises and uses her flutter device twice daily. She reports maintaining her home, does chair exercises 2-3 times weekly, and goes out daily to stay as active.     . Manage Fatigue (Tiredness)       Follow Up Date 03/13/20    - eat healthy - get at least 7 to 8 hours of sleep at night - get outdoors every day (weather permitting) - keep room cool and dark - practice relaxation or meditation daily - use devices that will help like a cane, sock-puller or reacher    Why is this important?   Feeling tired or worn out is a common symptom of COPD (chronic obstructive pulmonary disease).  Learning when you feel your best and when you need rest is important.  Managing the tiredness (fatigue) will help you be active and enjoy life.     Notes: Nurse sent patient Planning Healthy Meals booklet. Patient does get out of the house daily (weather and breathing permitting). She does use a straight cane and plans to discuss getting a quad cane with PCP during her upcoming appointment on 02/28/20. Updated: 01/29/20   Updated 04/16/20: Patient states she does sleep well currently. She does purse lip breathing and rest when she become short of breath. Patient uses her cane and plans to ask provider for a prescription for a quad cane on her next follow up visit. Patient goes out daily weather permitting to get her ice tea at [employer]. Patient states she is going to eat healthier by increasing her fruit/vegetable intake to 3 servings per day and limit her salt intake.    . COMPLETED:  Patient will not have any ED or hospital visits within the next 90  days       [facility] (see longtitudinal plan of care for  additional care plan information)  Current Barriers:  [name] Knowledge deficits related to basic COPD self care/management   Case Manager Clinical Goal(s):  Over the next 90 days, patient will be able to verbalize understanding of COPD action plan and when to seek appropriate levels of medical care  Over the next 90 days, patient will engage in lite exercise as tolerated to build/regain stamina and strength and reduce shortness of breath through activity tolerance  Over the next 90 days, patient will not be hospitalized for COPD exacerbation   Interventions:   Provided patient with COPD action plan and reinforced importance of daily self assessment  Advised patient to self assesses COPD action plan zone and make appointment with provider if in the yellow zone for 48 hours without improvement.  Advised patient to engage in light exercise as tolerated 3-5 days a week  Patient Self Care Activities:  . Takes medications as prescribed including inhalers . Practices and uses pursed lip breathing for shortness of breath recovery and prevention . Self assesses COPD action plan zone and makes appointment with provider if in the yellow zone for 48 hours without improvement. . Patient increases her physical activity as tolerated based upon her respiratory status.  Please see past updates related to this goal by clicking on the "Past Updates" button in the selected goal  Updated: 01/29/20  Resolved due to duplicate goals 03/20/08     . Track and Manage My Symptoms       Timeframe:  Long-Range Goal Priority:  High Start Date:   12/19/19                          Expected End Date:  09/11/20                     Follow Up Date 07/12/20   - develop a rescue plan - eliminate symptom triggers at home - follow rescue plan if symptoms flare-up - keep follow-up appointments    Why is this important?   Tracking your symptoms and other information about your health helps your doctor plan your care.   Write down the symptoms, the time of day, what you were doing and what medicine you are taking.  You will soon learn how to manage your symptoms.     Notes: Patient does eliminate symptom triggers at home. Nurse sent her COPD zones and action plans. Patient does contact her provider as needed when she has an COPD exacerbation. Updated: 01/29/20 Updated 04/16/20: Patient tracks her COPD symptoms daily and calls her providers when symptoms start to get out of her baseline. Patient limits her outside activity during very cold and hot days. She has eliminated triggers within her home. Patient follows her COPD regiment at home daily of taking her inhalers, nebulizers, and using flutter device.       Plan: RN Health Coach will send PCP quarterly update, will send patient Ensure coupons and calendar booklet, and will call patient within the month of April. Follow-up:  Patient agrees to Care Plan and Follow-up.  Emelia Loron RN, BSN [place] [phone] [email or web address]

## 2020-04-25 ENCOUNTER — Telehealth: Payer: Self-pay | Admitting: Internal Medicine

## 2020-04-25 NOTE — Telephone Encounter (Signed)
Patient requesting refill [id number] - [place], [place] - [address]  clonazePAM (KLONOPIN) 1 MG tablet Promethazine-Codeine 6.25-10 MG/5ML SOLN

## 2020-04-28 ENCOUNTER — Ambulatory Visit (INDEPENDENT_AMBULATORY_CARE_PROVIDER_SITE_OTHER): Payer: Medicare Other | Admitting: Psychology

## 2020-04-28 DIAGNOSIS — F331 Major depressive disorder, recurrent, moderate: Secondary | ICD-10-CM | POA: Diagnosis not present

## 2020-04-28 MED ORDER — CLONAZEPAM 1 MG PO TABS
ORAL_TABLET | ORAL | 1 refills | Status: DC
Start: 2020-04-28 — End: 2020-08-26

## 2020-04-28 MED ORDER — PROMETHAZINE-CODEINE 6.25-10 MG/5ML PO SOLN: 5.0000 mL | Freq: Four times a day (QID) | ORAL | 1 refills | Status: DC | PRN

## 2020-04-28 NOTE — Telephone Encounter (Signed)
Done. Thx.

## 2020-05-06 ENCOUNTER — Other Ambulatory Visit: Payer: Self-pay | Admitting: Internal Medicine

## 2020-05-12 ENCOUNTER — Ambulatory Visit (INDEPENDENT_AMBULATORY_CARE_PROVIDER_SITE_OTHER): Payer: Medicare Other | Admitting: Psychology

## 2020-05-12 DIAGNOSIS — F331 Major depressive disorder, recurrent, moderate: Secondary | ICD-10-CM | POA: Diagnosis not present

## 2020-05-22 ENCOUNTER — Encounter: Payer: Self-pay | Admitting: Internal Medicine

## 2020-05-22 ENCOUNTER — Other Ambulatory Visit: Payer: Self-pay

## 2020-05-22 ENCOUNTER — Ambulatory Visit (INDEPENDENT_AMBULATORY_CARE_PROVIDER_SITE_OTHER): Payer: Medicare Other | Admitting: Internal Medicine

## 2020-05-22 VITALS — BP 134/80 | HR 77 | Temp 99.6°F | Ht 60.0 in | Wt 152.2 lb

## 2020-05-22 DIAGNOSIS — K047 Periapical abscess without sinus: Secondary | ICD-10-CM

## 2020-05-22 DIAGNOSIS — K069 Disorder of gingiva and edentulous alveolar ridge, unspecified: Secondary | ICD-10-CM

## 2020-05-22 DIAGNOSIS — J449 Chronic obstructive pulmonary disease, unspecified: Secondary | ICD-10-CM

## 2020-05-22 DIAGNOSIS — E538 Deficiency of other specified B group vitamins: Secondary | ICD-10-CM | POA: Diagnosis not present

## 2020-05-22 DIAGNOSIS — M270 Developmental disorders of jaws: Secondary | ICD-10-CM

## 2020-05-22 DIAGNOSIS — J479 Bronchiectasis, uncomplicated: Secondary | ICD-10-CM

## 2020-05-22 MED ORDER — PROMETHAZINE-CODEINE 6.25-10 MG/5ML PO SOLN: 5.0000 mL | Freq: Four times a day (QID) | ORAL | 1 refills | Status: DC | PRN

## 2020-05-22 MED ORDER — METHYLPREDNISOLONE ACETATE 80 MG/ML IJ SUSP
80.0000 mg | Freq: Once | INTRAMUSCULAR | Status: AC
Start: 2020-05-22 — End: 2020-05-22
  Administered 2020-05-22: 80 mg via INTRAMUSCULAR

## 2020-05-22 NOTE — Assessment & Plan Note (Addendum)
3/22 a new problem with tooth infection - s/p 3 teeth extraction and an implant extraction.  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 Another intermittent severely infected teeth and only with the remainder as is.  I wonder if cheaper dentures could be done with a cut out for torus palatini that can be fixed without implants, just with dental paste.  She would probably get a second opinion at the dental or oral surgery schools  She is currently on Levaquin

## 2020-05-22 NOTE — Assessment & Plan Note (Signed)
On B12 

## 2020-05-22 NOTE — Assessment & Plan Note (Signed)
Depo-Medrol 80 mg IM for him cough exacerbation Promethazine with codeine prescription renewed  Potential benefits of a long term opioids use as well as potential risks (i.e. addiction risk, apnea etc) and complications (i.e. Somnolence, constipation and others) were explained to the patient and were aknowledged.

## 2020-05-22 NOTE — Assessment & Plan Note (Addendum)
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  Phenytoin is likely to remain as a lifelong therapy.  Dentures and implants could potentially be a problem Chronic due to phenytoin.  Phenytoin is likely to remain as a lifelong therapy.  Dentures and implants could potentially be a problem

## 2020-05-22 NOTE — Progress Notes (Signed)
Subjective:  Patient ID: Veronica [name], female    DOB: 29-Apr-1950  Age: 70 y.o. MRN: [id number]  CC: Follow-up (3 month f/u)   HPI Veronica [name] presents for tooth infection - s/p 3 teeth extraction and an implant extraction.  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  F/u chronic cough, COPD    Outpatient Medications Prior to Visit  Medication Sig Dispense Refill  . acetaminophen-codeine (TYLENOL #3) 300-30 MG tablet Take 1 tablet by mouth every 4 (four) hours as needed. 60 tablet 2  . albuterol (PROVENTIL) (2.5 MG/3ML) 0.083% nebulizer solution USE 1 VIAL IN NEBULIZER 2 TIMES DAILY. Generic: VENTOLIN 60 vial 5  . albuterol (VENTOLIN HFA) 108 (90 Base) MCG/ACT inhaler INHALE 1 TO 2 PUFFS INTO THE LUNGS EVERY 4 HOURS AS NEEDED FOR WHEEZING OR SHORTNESS OF BREATH 8.5 g 3  . aspirin 81 MG chewable tablet Chew by mouth daily.    [name] atorvastatin (LIPITOR) 40 MG tablet TAKE 1 TABLET(40 MG) BY MOUTH DAILY 90 tablet 3  . B-D INSULIN SYRINGE 1CC/25GX1" 25G X 1" 1 ML MISC USE AS DIRECTED EVERY 2 WEEKS 50 each 5  . budesonide (PULMICORT) 0.25 MG/2ML nebulizer solution USE 1 VIAL IN NEBULIZER 2 TIMES DAILY. Generic: PULMICORT 120 mL 2  . calcium carbonate (OSCAL) 1500 (600 Ca) MG TABS tablet Take 600 mg of elemental calcium by mouth 2 (two) times daily with a meal.     . cariprazine (VRAYLAR) capsule Take 1 capsule (1.5 mg total) by mouth daily. Lot # [id number] EXP 0701/22 35 capsule 0  . Cholecalciferol (VITAMIN D3) 50 MCG (2000 UT) capsule Take 1 capsule (2,000 Units total) by mouth daily. 100 capsule 3  . clonazePAM (KLONOPIN) 1 MG tablet TAKE 2 TABLETS BY MOUTH EVERY NIGHT AT BEDTIME AND 1/2 TABLET EXTRA IN DAYTIME AS NEEDED 225 tablet 1  . cyanocobalamin (,VITAMIN B-12,) 1000 MCG/ML injection INJECT 1ML IN THE MUSCLE EVERY 14 DAYS.  10 mL 5  . Cyanocobalamin (VITAMIN B-12 PO) Take 5,000 mcg by mouth daily.    . diphenoxylate-atropine (LOMOTIL) 2.5-0.025 MG tablet Take 1-2 tablets by mouth 4 (four) times daily as needed for diarrhea or loose stools. 30 tablet 1  . Docusate Calcium (STOOL SOFTENER PO) Take 100 mg by mouth as needed (for constipation).     [name] escitalopram (LEXAPRO) 20 MG tablet Take 1 tablet (20 mg total) by mouth daily. 90 tablet 3  . formoterol (PERFOROMIST) 20 MCG/2ML nebulizer solution Take 2 mLs (20 mcg total) by nebulization 2 (two) times daily. 120 mL 3  . furosemide (LASIX) 20 MG tablet TAKE 1 TABLET(20 MG) BY MOUTH DAILY AS NEEDED (Patient taking differently: Take 20 mg by mouth daily.) 90 tablet 3  . ketoconazole (NIZORAL) 2 % cream Apply 1 application topically 2 (two) times daily. On rash 90 g 1  . ketoconazole (NIZORAL) 200 MG tablet Take 1 tablet (200 mg total) by mouth daily. 10 tablet 0  . levothyroxine (SYNTHROID) 88 MCG tablet TAKE 1 TABLET(88 MCG) BY MOUTH DAILY 90 tablet 3  . nitroGLYCERIN (NITROSTAT) 0.4 MG SL tablet USE AS DIRECTED SUBLIGUALLY 25 tablet 3  . Omega-3 Fatty Acids (FISH OIL) 1000 MG CAPS Take 1 capsule by mouth daily.    . phenytoin (DILANTIN) 100 MG ER capsule TAKE 2 CAPSULES BY MOUTH EVERY  MORNING AND 1 CAPSULE EVERY EVENING 270 capsule 3  . PREVIDENT 5000 BOOSTER PLUS 1.1 % PSTE BRUSH BY MOUTH TWICE DAILY    . promethazine (PHENERGAN) 12.5 MG tablet 12.5 mg every 6 (six) hours as needed.     . Promethazine-Codeine 6.25-10 MG/5ML SOLN Take 5 mLs by mouth every 6 (six) hours as needed. Do not take with Tylenol 3 300 mL 1  . Respiratory Therapy Supplies (FLUTTER) DEVI Use after nebulization treatments 1 each 0  . sodium chloride (MURO 128) 5 % ophthalmic solution Place 1 drop into both eyes as needed for irritation.     . SYRINGE-NEEDLE, DISP, 3 ML (BD ECLIPSE SYRINGE) 25G X 1" 3 ML MISC Use sq q 2 wks 50 each 3  . traZODone (DESYREL) 50 MG tablet TAKE 4 TABLETS(200 MG) BY  MOUTH AT BEDTIME 360 tablet 3  . verapamil (CALAN-SR) 240 MG CR tablet TAKE 1 TABLET(240 MG) BY MOUTH AT BEDTIME 90 tablet 3  . chlorhexidine (PERIDEX) 0.12 % solution Use to wash mouth twice a day    . HYDROcodone-acetaminophen (NORCO/VICODIN) 5-325 MG tablet Take 1 tablet by mouth every 6 (six) hours as needed. Take 1 by mouth every 6 hours as needed     No facility-administered medications prior to visit.    ROS: Review of Systems  Constitutional: Negative for activity change, appetite change, chills, fatigue and unexpected weight change.  HENT: Positive for congestion, dental problem and sinus pressure. Negative for mouth sores.   Eyes: Negative for visual disturbance.  Respiratory: Positive for cough. Negative for chest tightness.   Gastrointestinal: Negative for abdominal pain and nausea.  Genitourinary: Negative for difficulty urinating, frequency and vaginal pain.  Musculoskeletal: Positive for arthralgias and back pain. Negative for gait problem.  Skin: Negative for pallor and rash.  Neurological: Negative for dizziness, tremors, weakness, numbness and headaches.  Psychiatric/Behavioral: Positive for dysphoric mood. Negative for confusion, sleep disturbance and suicidal ideas. The patient is nervous/anxious.     Objective:  BP 134/80 (BP Location: Left Arm)   Pulse 77   Temp 99.6 F (37.6 C) (Oral)   Ht 5' (1.524 m)   Wt 152 lb 3.2 oz (69 kg)   SpO2 96%   BMI 29.72 kg/m   BP Readings from Last 3 Encounters:  05/22/20 134/80  03/10/20 120/78  02/28/20 128/72    Wt Readings from Last 3 Encounters:  05/22/20 152 lb 3.2 oz (69 kg)  03/10/20 157 lb 9.6 oz (71.5 kg)  02/28/20 160 lb 6.4 oz (72.8 kg)    Physical Exam Constitutional:      General: She is not in acute distress.    Appearance: She is well-developed.  HENT:     Head: Normocephalic.     Right Ear: External ear normal.     Left Ear: External ear normal.     Nose: Nose normal.  Eyes:     General:         Right eye: No discharge.        Left eye: No discharge.     Conjunctiva/sclera: Conjunctivae normal.     Pupils: Pupils are equal, round, and reactive to light.  Neck:     Thyroid: No thyromegaly.     Vascular: No JVD.     Trachea: No tracheal deviation.  Cardiovascular:     Rate and Rhythm: Normal rate and regular rhythm.     Heart sounds: Normal heart sounds.  Pulmonary:     Effort: No respiratory distress.  Breath sounds: No stridor. No wheezing.  Abdominal:     General: Bowel sounds are normal. There is no distension.     Palpations: Abdomen is soft. There is no mass.     Tenderness: There is no abdominal tenderness. There is no guarding or rebound.  Musculoskeletal:        General: Tenderness present.     Cervical back: Normal range of motion and neck supple.  Lymphadenopathy:     Cervical: No cervical adenopathy.  Skin:    Findings: No erythema or rash.  Neurological:     Mental Status: She is oriented to person, place, and time.     Cranial Nerves: No cranial nerve deficit.     Motor: No abnormal muscle tone.     Coordination: Coordination abnormal.     Deep Tendon Reflexes: Reflexes normal.  Psychiatric:        Behavior: Behavior normal.        Thought Content: Thought content normal.        Judgment: Judgment normal.   Swollen gums, missing teeth, swollen lower face  Using a cane Lungs are clear today Lab Results  Component Value Date   WBC 10.8 (H) 08/23/2019   HGB 12.4 08/23/2019   HCT 36.9 08/23/2019   PLT 279.0 08/23/2019   GLUCOSE 68 (L) 02/12/2020   CHOL 249 (H) 11/10/2018   TRIG 88.0 11/10/2018   HDL 57.70 11/10/2018   LDLDIRECT 165.0 09/26/2009   LDLCALC 174 (H) 11/10/2018   ALT 13 12/03/2019   AST 22 12/03/2019   NA 141 02/12/2020   K 3.8 02/12/2020   CL 98 02/12/2020   CREATININE 0.78 02/12/2020   BUN 5 (L) 02/12/2020   CO2 35 (H) 02/12/2020   TSH 2.22 11/10/2018   INR 1.0 ratio 11/07/2009   HGBA1C 5.6 08/23/2019    MR 3D  Recon At Scanner  Result Date: 02/12/2019 CLINICAL DATA:  Right upper quadrant abdominal pain. Suspected cholecystitis. EXAM: MRI ABDOMEN WITHOUT AND WITH CONTRAST (INCLUDING MRCP) TECHNIQUE: Multiplanar multisequence MR imaging of the abdomen was performed both before and after the administration of intravenous contrast. Heavily T2-weighted images of the biliary and pancreatic ducts were obtained, and three-dimensional MRCP images were rendered by post processing. CONTRAST:  9mL GADAVIST GADOBUTROL 1 MMOL/ML IV SOLN COMPARISON:  Abdominal ultrasound 08/07/2015. Abdominal CT 08/31/2010. FINDINGS: Despite efforts by the technologist and patient, mild motion artifact is present on today's exam and could not be eliminated. This reduces exam sensitivity and specificity. Lower chest: There are no significant findings within the visualized lower chest. Hepatobiliary: Small hepatic cysts are noted. There are new suspicious hepatic findings. The gallbladder is surgically absent. There is mild fusiform extrahepatic biliary dilatation with the common hepatic duct measuring up to 8 mm in diameter. The duct tapers distally and appears similar to previous CT. No evidence of choledocholithiasis or intrahepatic biliary dilatation. Pancreas: Unremarkable. No pancreatic ductal dilatation or surrounding inflammatory changes. Stable prominent fat between the pancreatic head and the superior mesenteric vein. Spleen: Normal in size without focal abnormality. Adrenals/Urinary Tract: Both adrenal glands appear normal. Small right renal cyst. No evidence of renal mass or hydronephrosis. Stomach/Bowel: No evidence of bowel wall thickening, distention or surrounding inflammatory change. Vascular/Lymphatic: There are no enlarged abdominal lymph nodes. Aortic and branch vessel atherosclerosis. No acute vascular findings identified. Other: The visualized anterior abdominal wall appears intact. No ascites. Musculoskeletal: No acute or  significant osseous findings. Facet hypertrophy and associated synovial enhancement noted within the mid  lumbar spine. IMPRESSION: 1. No acute findings or explanation for the patient's symptoms. 2. Mild fusiform extrahepatic biliary dilatation status post cholecystectomy, similar to previous CT. No evidence of choledocholithiasis or intrahepatic biliary dilatation. 3.  Aortic Atherosclerosis (ICD10-I70.0). Electronically Signed   By: Richardean Sale M.D.   On: 02/12/2019 13:07   MR ABD/MRCP  Result Date: 02/12/2019 CLINICAL DATA:  Right upper quadrant abdominal pain. Suspected cholecystitis. EXAM: MRI ABDOMEN WITHOUT AND WITH CONTRAST (INCLUDING MRCP) TECHNIQUE: Multiplanar multisequence MR imaging of the abdomen was performed both before and after the administration of intravenous contrast. Heavily T2-weighted images of the biliary and pancreatic ducts were obtained, and three-dimensional MRCP images were rendered by post processing. CONTRAST:  28mL GADAVIST GADOBUTROL 1 MMOL/ML IV SOLN COMPARISON:  Abdominal ultrasound 08/07/2015. Abdominal CT 08/31/2010. FINDINGS: Despite efforts by the technologist and patient, mild motion artifact is present on today's exam and could not be eliminated. This reduces exam sensitivity and specificity. Lower chest: There are no significant findings within the visualized lower chest. Hepatobiliary: Small hepatic cysts are noted. There are new suspicious hepatic findings. The gallbladder is surgically absent. There is mild fusiform extrahepatic biliary dilatation with the common hepatic duct measuring up to 8 mm in diameter. The duct tapers distally and appears similar to previous CT. No evidence of choledocholithiasis or intrahepatic biliary dilatation. Pancreas: Unremarkable. No pancreatic ductal dilatation or surrounding inflammatory changes. Stable prominent fat between the pancreatic head and the superior mesenteric vein. Spleen: Normal in size without focal abnormality.  Adrenals/Urinary Tract: Both adrenal glands appear normal. Small right renal cyst. No evidence of renal mass or hydronephrosis. Stomach/Bowel: No evidence of bowel wall thickening, distention or surrounding inflammatory change. Vascular/Lymphatic: There are no enlarged abdominal lymph nodes. Aortic and branch vessel atherosclerosis. No acute vascular findings identified. Other: The visualized anterior abdominal wall appears intact. No ascites. Musculoskeletal: No acute or significant osseous findings. Facet hypertrophy and associated synovial enhancement noted within the mid lumbar spine. IMPRESSION: 1. No acute findings or explanation for the patient's symptoms. 2. Mild fusiform extrahepatic biliary dilatation status post cholecystectomy, similar to previous CT. No evidence of choledocholithiasis or intrahepatic biliary dilatation. 3.  Aortic Atherosclerosis (ICD10-I70.0). Electronically Signed   By: Richardean Sale M.D.   On: 02/12/2019 13:07    Assessment & Plan:    Walker Kehr, MD

## 2020-05-22 NOTE — Assessment & Plan Note (Signed)
3/22 a new problem with tooth infection - s/p 3 teeth extraction and an implant extraction.  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 Another intermittent severely infected teeth and only with the remainder as is.  I wonder if cheaper dentures could be done with a cut out for torus palatini that can be fixed without implants, just with dental paste.  She would probably get a second opinion at the dental or oral surgery schools

## 2020-05-22 NOTE — Patient Instructions (Addendum)
Dental school  Oral surgery school  [employer] -  [place]

## 2020-05-26 ENCOUNTER — Ambulatory Visit (INDEPENDENT_AMBULATORY_CARE_PROVIDER_SITE_OTHER): Payer: Medicare Other | Admitting: Psychology

## 2020-05-26 ENCOUNTER — Telehealth: Payer: Self-pay | Admitting: *Deleted

## 2020-05-26 DIAGNOSIS — F331 Major depressive disorder, recurrent, moderate: Secondary | ICD-10-CM

## 2020-05-26 NOTE — Telephone Encounter (Signed)
-----   Message from Earnstine Regal, [place] sent at 05/22/2020 11:48 AM EST ----- Regarding: Pt Assistance Order her Veronica [name]

## 2020-05-26 NOTE — Telephone Encounter (Signed)
Called [facility] Pt assistance [phone] to order pt Vralyer.. too early to place order. Pt is due 06/26/20. Will call back beginning of April to order.Marland KitchenJohny Chess

## 2020-06-09 ENCOUNTER — Ambulatory Visit (INDEPENDENT_AMBULATORY_CARE_PROVIDER_SITE_OTHER): Payer: Medicare Other | Admitting: Psychology

## 2020-06-09 DIAGNOSIS — F331 Major depressive disorder, recurrent, moderate: Secondary | ICD-10-CM

## 2020-06-17 NOTE — Telephone Encounter (Signed)
Called [facility] spoke w/rep place order for the Vrayler. Should receive in 7-10 business day. Next refill due June 12th.Marland KitchenJohny Chess

## 2020-06-23 ENCOUNTER — Other Ambulatory Visit: Payer: Self-pay | Admitting: *Deleted

## 2020-06-23 ENCOUNTER — Ambulatory Visit (INDEPENDENT_AMBULATORY_CARE_PROVIDER_SITE_OTHER): Payer: Medicare Other | Admitting: Psychology

## 2020-06-23 DIAGNOSIS — E875 Hyperkalemia: Secondary | ICD-10-CM

## 2020-06-23 DIAGNOSIS — F331 Major depressive disorder, recurrent, moderate: Secondary | ICD-10-CM

## 2020-06-23 DIAGNOSIS — E539 Vitamin B deficiency, unspecified: Secondary | ICD-10-CM

## 2020-06-23 DIAGNOSIS — L509 Urticaria, unspecified: Secondary | ICD-10-CM

## 2020-06-23 DIAGNOSIS — E559 Vitamin D deficiency, unspecified: Secondary | ICD-10-CM

## 2020-06-23 DIAGNOSIS — J449 Chronic obstructive pulmonary disease, unspecified: Secondary | ICD-10-CM

## 2020-06-23 NOTE — Telephone Encounter (Signed)
Notified pt received Pt assistance for the Vrayler. Delivery # [phone]. 3 bottles left at the front for pick-up.Marland KitchenJohny Chess

## 2020-06-23 NOTE — Telephone Encounter (Signed)
Pt requesting refills on her Tylenol # 3. Pls send to [facility].Marland KitchenJohny Chess

## 2020-06-24 MED ORDER — ACETAMINOPHEN-CODEINE #3 300-30 MG PO TABS: 1.0000 | ORAL_TABLET | ORAL | 2 refills | Status: DC | PRN

## 2020-07-07 ENCOUNTER — Ambulatory Visit (INDEPENDENT_AMBULATORY_CARE_PROVIDER_SITE_OTHER): Payer: Medicare Other | Admitting: Psychology

## 2020-07-07 DIAGNOSIS — F331 Major depressive disorder, recurrent, moderate: Secondary | ICD-10-CM

## 2020-07-08 ENCOUNTER — Other Ambulatory Visit: Payer: Self-pay | Admitting: *Deleted

## 2020-07-08 ENCOUNTER — Other Ambulatory Visit: Payer: Self-pay

## 2020-07-08 ENCOUNTER — Ambulatory Visit (INDEPENDENT_AMBULATORY_CARE_PROVIDER_SITE_OTHER): Payer: Medicare Other | Admitting: Internal Medicine

## 2020-07-08 ENCOUNTER — Encounter: Payer: Self-pay | Admitting: Internal Medicine

## 2020-07-08 VITALS — BP 148/70 | HR 78 | Temp 98.7°F | Ht 60.0 in | Wt 151.4 lb

## 2020-07-08 DIAGNOSIS — Z01818 Encounter for other preprocedural examination: Secondary | ICD-10-CM

## 2020-07-08 DIAGNOSIS — F329 Major depressive disorder, single episode, unspecified: Secondary | ICD-10-CM

## 2020-07-08 DIAGNOSIS — J439 Emphysema, unspecified: Secondary | ICD-10-CM | POA: Diagnosis not present

## 2020-07-08 DIAGNOSIS — K069 Disorder of gingiva and edentulous alveolar ridge, unspecified: Secondary | ICD-10-CM

## 2020-07-08 NOTE — Patient Outreach (Signed)
[facility]) Care Management  [facility] Care Manager  07/08/2020   Veronica [name] 1950/10/27 [id number]  Subjective: Successful telephone outreach call to patient. HIPAA identifiers obtained. Patient reports that she needs to have oral surgery that will require extensive tooth removal, bone replacement, and palate work. Patient has had ongoing mouth and dental issues for several months which has limited her ability to chew and eat. As a result she has lost about 8 pounds. She is supplementing with high protein Boost and Ensure, nurse will send her some Ensure Coupons. Nurse discussed with the patient to continue to eat soft foods as tolerated and to drink water and fluids to stay hydrated. It was discussed for the patient to weigh herself daily to monitor her weight closely. Patient reports that her scale is not accurate, nurse will send patient a scale to weigh daily. Patient explained that she was instructed to limit the use of her breathing medications due to the steroids they contain which will increase chances of mouth infection. She adds that currently her COPD is at baseline, she is avoiding going out on high pollen days, and she has eliminated all potential triggers within her home. Patient states she is performing good oral hygiene and using prescribed Peridex mouthwash. She reports that she does have transportation to and from the surgery and that she has an appointment with her PCP to receive medical clearance for the surgery today. Nurse did inform patient that she would call her on her surgery day to ensure she was doing well. Patient did not have any further questions or concerns today and did confirm that she has this nurse's contact number to call her if needed.   Encounter Medications:  Outpatient Encounter Medications as of 07/08/2020  Medication Sig Note  . acetaminophen-codeine (TYLENOL #3) 300-30 MG tablet Take 1 tablet by mouth every 4 (four) hours as needed.   [name]  albuterol (PROVENTIL) (2.5 MG/3ML) 0.083% nebulizer solution USE 1 VIAL IN NEBULIZER 2 TIMES DAILY. Generic: VENTOLIN 07/10/2019: Uses 1 vial two times daily.  [name] albuterol (VENTOLIN HFA) 108 (90 Base) MCG/ACT inhaler INHALE 1 TO 2 PUFFS INTO THE LUNGS EVERY 4 HOURS AS NEEDED FOR WHEEZING OR SHORTNESS OF BREATH   . aspirin 81 MG chewable tablet Chew by mouth daily.   [name] atorvastatin (LIPITOR) 40 MG tablet TAKE 1 TABLET(40 MG) BY MOUTH DAILY   . B-D INSULIN SYRINGE 1CC/25GX1" 25G X 1" 1 ML MISC USE AS DIRECTED EVERY 2 WEEKS   . budesonide (PULMICORT) 0.25 MG/2ML nebulizer solution USE 1 VIAL IN NEBULIZER 2 TIMES DAILY. Generic: PULMICORT   . calcium carbonate (OSCAL) 1500 (600 Ca) MG TABS tablet Take 600 mg of elemental calcium by mouth 2 (two) times daily with a meal.    . cariprazine (VRAYLAR) capsule Take 1 capsule (1.5 mg total) by mouth daily. Lot # [id number] EXP 0701/22   . chlorhexidine (PERIDEX) 0.12 % solution Use to wash mouth twice a day   . Cholecalciferol (VITAMIN D3) 50 MCG (2000 UT) capsule Take 1 capsule (2,000 Units total) by mouth daily.   . clonazePAM (KLONOPIN) 1 MG tablet TAKE 2 TABLETS BY MOUTH EVERY NIGHT AT BEDTIME AND 1/2 TABLET EXTRA IN DAYTIME AS NEEDED   . cyanocobalamin (,VITAMIN B-12,) 1000 MCG/ML injection INJECT IN THE MUSCLE EVERY 14 DAYS.   [name] Cyanocobalamin (VITAMIN B-12 PO) Take 5,000 mcg by mouth daily.   . diphenoxylate-atropine (LOMOTIL) 2.5-0.025 MG tablet Take 1-2 tablets by mouth 4 (four) times  daily as needed for diarrhea or loose stools. 07/10/2019: Last used 2 weeks ago.  Mariane Baumgarten Calcium (STOOL SOFTENER PO) Take 100 mg by mouth as needed (for constipation).  07/10/2019: Last used 2 weeks ago.  . escitalopram (LEXAPRO) 20 MG tablet Take 1 tablet (20 mg total) by mouth daily.   . formoterol (PERFOROMIST) 20 MCG/2ML nebulizer solution Take 2 mLs (20 mcg total) by nebulization 2 (two) times daily.   . furosemide (LASIX) 20 MG tablet TAKE 1 TABLET(20 MG) BY MOUTH  DAILY AS NEEDED (Patient taking differently: Take 20 mg by mouth daily.) 04/16/2020: Patient takes daily  . HYDROcodone-acetaminophen (NORCO/VICODIN) 5-325 MG tablet Take 1 tablet by mouth every 6 (six) hours as needed. Take 1 by mouth every 6 hours as needed   . ketoconazole (NIZORAL) 2 % cream Apply 1 application topically 2 (two) times daily. On rash   . ketoconazole (NIZORAL) 200 MG tablet Take 1 tablet (200 mg total) by mouth daily.   [name] levothyroxine (SYNTHROID) 88 MCG tablet TAKE 1 TABLET(88 MCG) BY MOUTH DAILY   . nitroGLYCERIN (NITROSTAT) 0.4 MG SL tablet USE AS DIRECTED SUBLIGUALLY   . Omega-3 Fatty Acids (FISH OIL) 1000 MG CAPS Take 1 capsule by mouth daily.   . phenytoin (DILANTIN) 100 MG ER capsule TAKE 2 CAPSULES BY MOUTH EVERY MORNING AND 1 CAPSULE EVERY EVENING   . PREVIDENT 5000 BOOSTER PLUS 1.1 % PSTE BRUSH BY MOUTH TWICE DAILY   . promethazine (PHENERGAN) 12.5 MG tablet 12.5 mg every 6 (six) hours as needed.  07/10/2019: Using as needed for nausea associated with migraines.  Last used 3 weeks ago.  . Promethazine-Codeine 6.25-10 MG/5ML SOLN Take 5 mLs by mouth every 6 (six) hours as needed. Do not take with Tylenol 3   . Respiratory Therapy Supplies (FLUTTER) DEVI Use after nebulization treatments   . sodium chloride (MURO 128) 5 % ophthalmic solution Place 1 drop into both eyes as needed for irritation.  07/10/2019: She is using daily.  . SYRINGE-NEEDLE, DISP, 3 ML (BD ECLIPSE SYRINGE) 25G X 1" 3 ML MISC Use sq q 2 wks   . traZODone (DESYREL) 50 MG tablet TAKE 4 TABLETS(200 MG) BY MOUTH AT BEDTIME   . verapamil (CALAN-SR) 240 MG CR tablet TAKE 1 TABLET(240 MG) BY MOUTH AT BEDTIME    No facility-administered encounter medications on file as of 07/08/2020.    Functional Status:  In your present state of health, do you have any difficulty performing the following activities: 03/10/2020  Hearing? N  Vision? N  Difficulty concentrating or making decisions? Y  Walking or climbing  stairs? N  Dressing or bathing? N  Doing errands, shopping? N  Preparing Food and eating ? N  Using the Toilet? N  In the past six months, have you accidently leaked urine? Y  Do you have problems with loss of bowel control? N  Managing your Medications? N  Managing your Finances? N  Housekeeping or managing your Housekeeping? N  Some recent data might be hidden    Fall/Depression Screening: Fall Risk  07/08/2020 04/16/2020 04/16/2020  Falls in the past year? 1 1 1   Comment - - -  Number falls in past yr: 0 0 0  Comment - - -  Injury with Fall? 1 0 0  Risk for fall due to : History of fall(s);Impaired mobility;Impaired balance/gait Impaired mobility;Impaired balance/gait;History of fall(s) Impaired mobility;Impaired balance/gait;History of fall(s)  Follow up - Falls prevention discussed;Education provided;Falls evaluation completed Falls evaluation completed;Education provided;Falls  prevention discussed   PHQ 2/9 Scores 04/16/2020 03/10/2020 12/19/2019 11/22/2019 05/08/2019 02/27/2019 02/21/2018  PHQ - 2 Score 3 1 2  0 4 4 4   PHQ- 9 Score 9 - 8 0 7 8 9     Assessment:  Goals Addressed            This Visit's Progress   . [facility]) Patient will verbalize continuation of eating healthy for the next 90 days       Timeframe:  Long-Range Goal Priority:  Medium Start Date: 12/19/19                             Expected End Date: 01/11/21                     Follow Up Date 08/11/20   - set goal weight - manage portion size - prepare main meal at home 3 to 5 days each week - read food labels for fat, fiber, carbohydrates and portion size - set a realistic goal   - drink at least 4 glasses of water daily      Why is this important?   When you are ready to manage your nutrition or weight, having a plan and setting goals will help.  Taking small steps to change how you eat and exercise is a good place to start.    Notes: Patient monitors sodium content in foods and reports she is making  healthier food choices. Nurse sent patient Planning Healthy Meals booklet.   Updated 04/16/20: Patient states she will try to eat 3 servings of fruits/vegetables daily, monitor her salt intake, and will drink 4 glasses of water daily. States she does want to lose weight by continuing to eat healthier, do chair exercises and be as active as tolerated.     [name] [facility]) Patient will verbalize continuation of tracking and monitoring her COPD symptoms for the next 90 days       Timeframe:  Long-Range Goal Priority:  High Start Date:   12/19/19                          Expected End Date:  01/11/21                    Follow Up Date 08/11/20   - develop a rescue plan - eliminate symptom triggers at home - follow rescue plan if symptoms flare-up - keep follow-up appointments  -Discussed staying aware of avoiding triggers that cause exacerbations   Why is this important?   Tracking your symptoms and other information about your health helps your doctor plan your care.  Write down the symptoms, the time of day, what you were doing and what medicine you are taking.  You will soon learn how to manage your symptoms.     Notes: Patient does eliminate symptom triggers at home. Nurse sent her COPD zones and action plans. Patient does contact her provider as needed when she has an COPD exacerbation. Updated: 01/29/20 Updated 04/16/20: Patient tracks her COPD symptoms daily and calls her providers when symptoms start to get out of her baseline. Patient limits her outside activity during very cold and hot days. She has eliminated triggers within her home. Patient follows her COPD regiment at home daily of taking her inhalers, nebulizers, and using flutter device.   Updated 07/08/20: Patient currently is undergoing dental issues where she is having  multiple ongoing surgeries. Due to the steroids in her breathing treatments she is limited about using them routinely to avoid infection. She reports her COPD is at baseline  currently and she is using Peridex mouthwash. Patient continues to track her COPD symptoms daily and calls her providers when symptoms start to get out of her baseline. Patient limits her outside activity during very cold and hot days. She has eliminated triggers within her home.     [name] [facility]) Patient will verbalize maintaining her weight during the long-term ongoing oral surgeries she will ecounter within the next 90 days       Timeframe:  Long-Range Goal Priority:  High Start Date:  07/08/20                           Expected End Date: 10/11/20 Follow Up Date: 08/11/20  -Discussed continuation of drinking Boost and Ensure with high protein for healing and calories -Encouraged continuation of drinking 4 glasses of water daily to maintain hydration -Discussed continuation of eating soft foods  -Discussed continuation of strict oral hygiene to avoid mouth infection -Discussed breathing medications have steroids which can lead to mouth infection and encouraged patient to continue to rinse her mouth and use Peridex mouthwash after taking breathing medications -Discussed weighing daily for better awareness of weight loss  Notes: Patient reports losing approximately 8 pounds from continuation of dental and mouth surgeries. Nurse will send patient a scale for daily weight monitoring and will send Ensure coupons.                         . COMPLETED: Learn and Do Breathing Exercises       Timeframe:  Long-Range Goal Priority:  High Start Date:   12/19/19                          Expected End Date: 4/26//22                     Follow Up Date 07/08/20   - do breathing exercises every day - do exercises in a comfortable position that makes breathing as easy as possible    Why is this important?   Breathing exercises can help lessen the cough that comes with chronic obstructive pulmonary disease.  Doing the exercises will give you more energy.  They will also help you to control your symptoms.     Notes: Patient does deep breathing exercises and uses her flutter device twice daily. She explains that she walks around her home as much as possible and does chair exercises to help improve her respiratory status and maintain her strength. Updated: 01/29/20  Updated 04/16/20: Patient continues to do her breathing exercises and uses her flutter device twice daily. She reports maintaining her home, does chair exercises 2-3 times weekly, and goes out daily to stay active.   Updated 07/08/20: Patient continues to do her breathing exercises and uses her flutter device twice daily. She reports maintaining her home, does chair exercises 2-3 times weekly, and goes out daily to stay active.    . Manage Fatigue (Tiredness)       Follow Up Date 03/13/20    - eat healthy - get at least 7 to 8 hours of sleep at night - get outdoors every day (weather permitting) - keep room cool and dark - practice relaxation or meditation daily -  use devices that will help like a cane, sock-puller or reacher    Why is this important?   Feeling tired or worn out is a common symptom of COPD (chronic obstructive pulmonary disease).  Learning when you feel your best and when you need rest is important.  Managing the tiredness (fatigue) will help you be active and enjoy life.     Notes: Nurse sent patient Planning Healthy Meals booklet. Patient does get out of the house daily (weather and breathing permitting). She does use a straight cane and plans to discuss getting a quad cane with PCP during her upcoming appointment on 02/28/20. Updated: 01/29/20   Updated 04/16/20: Patient states she does sleep well currently. She does purse lip breathing and rests when she become short of breath. Patient uses her cane and plans to ask provider for a prescription for a quad cane on her next follow up visit. Patient goes out daily weather permitting to get her ice tea at [employer]. Patient states she is going to eat healthier by  increasing her fruit/vegetable intake to 3 servings per day and limit her salt intake.      Plan: RN Health Coach will send PCP a quarterly update, will send patient a scale and Ensure coupons, will call patient 08/04/20 in the afternoon after her surgery to ensure that she is doing well and assist if needed.  Follow-up:  Patient agrees to Care Plan and Follow-up.  Emelia Loron RN, BSN [place] [phone] [email or web address]

## 2020-07-08 NOTE — Patient Instructions (Addendum)
Goals Addressed            This Visit's Progress   . [facility]) Patient will verbalize continuation of eating healthy for the next 90 days       Timeframe:  Long-Range Goal Priority:  Medium Start Date: 12/19/19                             Expected End Date: 01/11/21                     Follow Up Date 08/11/20   - set goal weight - manage portion size - prepare main meal at home 3 to 5 days each week - read food labels for fat, fiber, carbohydrates and portion size - set a realistic goal   - drink at least 4 glasses of water daily      Why is this important?   When you are ready to manage your nutrition or weight, having a plan and setting goals will help.  Taking small steps to change how you eat and exercise is a good place to start.    Notes: Patient monitors sodium content in foods and reports she is making healthier food choices. Nurse sent patient Planning Healthy Meals booklet.   Updated 04/16/20: Patient states she will try to eat 3 servings of fruits/vegetables daily, monitor her salt intake, and will drink 4 glasses of water daily. States she does want to lose weight by continuing to eat healthier, do chair exercises and be as active as tolerated.     [name] [facility]) Patient will verbalize continuation of tracking and monitoring her COPD symptoms for the next 90 days       Timeframe:  Long-Range Goal Priority:  High Start Date:   12/19/19                          Expected End Date:  01/11/21                    Follow Up Date 08/11/20   - develop a rescue plan - eliminate symptom triggers at home - follow rescue plan if symptoms flare-up - keep follow-up appointments  -Discussed staying aware of avoiding triggers that cause exacerbations   Why is this important?   Tracking your symptoms and other information about your health helps your doctor plan your care.  Write down the symptoms, the time of day, what you were doing and what medicine you are taking.  You will soon learn how  to manage your symptoms.     Notes: Patient does eliminate symptom triggers at home. Nurse sent her COPD zones and action plans. Patient does contact her provider as needed when she has an COPD exacerbation. Updated: 01/29/20 Updated 04/16/20: Patient tracks her COPD symptoms daily and calls her providers when symptoms start to get out of her baseline. Patient limits her outside activity during very cold and hot days. She has eliminated triggers within her home. Patient follows her COPD regiment at home daily of taking her inhalers, nebulizers, and using flutter device.   Updated 07/08/20: Patient currently is undergoing dental issues where she is having multiple ongoing surgeries. Due to the steroids in her breathing treatments she is limited about using them routinely to avoid infection. She reports her COPD is at baseline currently and she is using Peridex mouthwash. Patient continues to track her COPD symptoms  daily and calls her providers when symptoms start to get out of her baseline. Patient limits her outside activity during very cold and hot days. She has eliminated triggers within her home.     [name] [facility]) Patient will verbalize maintaining her weight during the long-term ongoing oral surgeries she will ecounter within the next 90 days       Timeframe:  Long-Range Goal Priority:  High Start Date:  07/08/20                           Expected End Date: 10/11/20 Follow Up Date: 08/11/20  -Discussed continuation of drinking Boost and Ensure with high protein for healing and calories -Encouraged continuation of drinking 4 glasses of water daily to maintain hydration -Discussed continuation of eating soft foods  -Discussed continuation of strict oral hygiene to avoid mouth infection -Discussed breathing medications have steroids which can lead to mouth infection and encouraged patient to continue to rinse her mouth and use Peridex mouthwash after taking breathing medications -Discussed weighing daily  for better awareness of weight loss  Notes: Patient reports losing approximately 8 pounds from continuation of dental and mouth surgeries. Nurse will send patient a scale for daily weight monitoring and will send Ensure coupons.                         . COMPLETED: Learn and Do Breathing Exercises       Timeframe:  Long-Range Goal Priority:  High Start Date:   12/19/19                          Expected End Date: 4/26//22                     Follow Up Date 07/08/20   - do breathing exercises every day - do exercises in a comfortable position that makes breathing as easy as possible    Why is this important?   Breathing exercises can help lessen the cough that comes with chronic obstructive pulmonary disease.  Doing the exercises will give you more energy.  They will also help you to control your symptoms.    Notes: Patient does deep breathing exercises and uses her flutter device twice daily. She explains that she walks around her home as much as possible and does chair exercises to help improve her respiratory status and maintain her strength. Updated: 01/29/20  Updated 04/16/20: Patient continues to do her breathing exercises and uses her flutter device twice daily. She reports maintaining her home, does chair exercises 2-3 times weekly, and goes out daily to stay active.   Updated 07/08/20: Patient continues to do her breathing exercises and uses her flutter device twice daily. She reports maintaining her home, does chair exercises 2-3 times weekly, and goes out daily to stay active.    . Manage Fatigue (Tiredness)       Follow Up Date 03/13/20    - eat healthy - get at least 7 to 8 hours of sleep at night - get outdoors every day (weather permitting) - keep room cool and dark - practice relaxation or meditation daily - use devices that will help like a cane, sock-puller or reacher    Why is this important?   Feeling tired or worn out is a common symptom of COPD (chronic  obstructive pulmonary disease).  Learning when you feel your best  and when you need rest is important.  Managing the tiredness (fatigue) will help you be active and enjoy life.     Notes: Nurse sent patient Planning Healthy Meals booklet. Patient does get out of the house daily (weather and breathing permitting). She does use a straight cane and plans to discuss getting a quad cane with PCP during her upcoming appointment on 02/28/20. Updated: 01/29/20   Updated 04/16/20: Patient states she does sleep well currently. She does purse lip breathing and rests when she become short of breath. Patient uses her cane and plans to ask provider for a prescription for a quad cane on her next follow up visit. Patient goes out daily weather permitting to get her ice tea at [employer]. Patient states she is going to eat healthier by increasing her fruit/vegetable intake to 3 servings per day and limit her salt intake.

## 2020-07-08 NOTE — Progress Notes (Signed)
Subjective:  Patient ID: Veronica [name], female    DOB: 01-23-1951  Age: 70 y.o. MRN: [id number]  CC: Medical Clearance (Dental Procedure Aug 03, 2020)   HPI [name] presents for IM pre-op eval  Req by Dr Jetta Lout - Advanced Oral and Fascial Surgery: planned -multiple teeth extraction, torus palatinus surgery under IV sedation w/Versed and Fentanyl  Hx: chronic pain, seizure disorder, bronchiectases f/u - stable Past Medical History:  Diagnosis Date  . Adenomatous colon polyp   . Asthma   . AVM (arteriovenous malformation)   . Bronchitis, chronic ([place])   . CAD (coronary artery disease)   . Colon polyp 01/04/91   hyperplastic  . COPD (chronic obstructive pulmonary disease) ([place])   . CVA (cerebral infarction)    following brain surgery  . Depression   . Diverticulosis of colon 02/17/06  . Endometriosis   . Gastric ulcer   . GERD (gastroesophageal reflux disease)   . History of colonic polyps   . Hyperlipidemia   . Hypertension   . LBP (low back pain)   . Migraine headache   . OA (osteoarthritis)   . PONV (postoperative nausea and vomiting)    slight nausea  . Seizure disorder ([place])   . Seizures ([place])   . Shortness of breath dyspnea   . Sjogren's disease ([place]) 2010   per Dr. Owens Shark, [place]  . Stroke [facility])    right side weakness  . Thyroid nodule   . Vitamin B 12 deficiency   . Vitamin D deficiency    Past Surgical History:  Procedure Laterality Date  . BRAIN SURGERY  1991  . CATARACT EXTRACTION W/ INTRAOCULAR LENS  IMPLANT, BILATERAL    . CHOLECYSTECTOMY    . COLONOSCOPY    . ESOPHAGOGASTRODUODENOSCOPY     x4  . HEMORRHOIDECTOMY WITH HEMORRHOID BANDING    . LAPAROSCOPIC NISSEN FUNDOPLICATION    . LAPAROSCOPIC OVARIAN CYSTECTOMY    . MOUTH SURGERY    . NISSEN FUNDOPLICATION  [id number]  . TEMPOROMANDIBULAR JOINT SURGERY  02/2001  . THYROIDECTOMY N/A 08/29/2015   Procedure:  TOTAL THYROIDECTOMY;  Surgeon: Armandina Gemma, MD;  Location: [place];  Service:  General;  Laterality: N/A;  . TOTAL THYROIDECTOMY  08/29/2015    reports that she has never smoked. She has never used smokeless tobacco. She reports that she does not drink alcohol and does not use drugs. family history includes Arthritis in her mother; Asthma in her brother; Bone cancer in her sister; Colon cancer (age of onset: 49) in her sister; Diabetes in her brother and another family member; Emphysema in her maternal aunt; Heart attack in her mother; Heart disease in her father; Hypertension in her mother and another family member; Liver cancer in her sister; Pancreatic cancer in her father; Rheumatologic disease in her maternal grandfather. Allergies  Allergen Reactions  . Avelox [Moxifloxacin Hcl In Nacl] Anaphylaxis, Swelling and Rash  . Cephalexin Shortness Of Breath and Itching  . Clindamycin Hcl Anaphylaxis    severe allergic reaction  . Moxifloxacin Anaphylaxis, Itching, Swelling and Rash    Avelox  . Amantadine Hcl Other (See Comments)    Rash and shortness of breath  . Bee Venom Itching and Swelling    Localized  . Cymbalta [Duloxetine Hcl] Nausea Only    Dizzy  . Cyproheptadine Hcl Other (See Comments)    Unknown  . Doxycycline Hyclate Other (See Comments)    High blood pressure  . Effexor [Venlafaxine Hydrochloride] Other (See Comments)  elev BP (sky high), dizzy, shaking, ER visit  . Effexor [Venlafaxine]     Restless   . Fluticasone-Salmeterol Itching  . Guaifenesin Other (See Comments)    Unknown  . Imipramine Hcl Other (See Comments)    Unknown   . Lactose Intolerance (Gi) Other (See Comments)    Per allergy testing  . Latex Itching    dermatitis  . Other Other (See Comments)    SULFONAMIDES = [Rash]  . Oxycodone-Acetaminophen Itching  . Phenytoin Other (See Comments)    Pt must DILANTIN "brand name" only   . Pirbuterol Acetate Other (See Comments)    Maxair, Unknown  . Remeron [Mirtazapine]     nightmares  . Salmeterol Xinafoate Other (See  Comments)    Shaking, Serevent  . Viibryd [Vilazodone Hcl] Itching and Nausea Only    shaky  . Zolpidem Tartrate Other (See Comments)    REACTION: not effective  . Azithromycin Itching and Rash  . Ciprofloxacin Itching and Rash  . Contrast Media  [Iodinated Diagnostic Agents] Rash    Other reaction(s): Respiratory Distress (ALLERGY/intolerance)  . Erythromycin Base Itching and Rash  . Flagyl [Metronidazole Hcl] Itching and Rash  . Fluconazole Itching and Rash  . Fluoxetine Rash  . Lansoprazole Itching and Rash  . Metoclopramide Hcl Itching and Rash  . Montelukast Sodium Itching and Rash  . Penicillins Itching and Rash    All cillins, Has patient had a PCN reaction causing immediate rash, facial/tongue/throat swelling, SOB or lightheadedness with hypotension: Yes Has patient had a PCN reaction causing severe rash involving mucus membranes or skin necrosis: No Has patient had a PCN reaction that required hospitalization No Has patient had a PCN reaction occurring within the last 10 years: Yes If all of the above answers are "NO", then may proceed with Cephalosporin use.   [name] Propulsid [Cisapride] Itching and Rash  . Reglan [Metoclopramide] Itching and Rash  . Sulfadiazine Itching and Rash  . Sulfamethoxazole-Trimethoprim Itching and Rash  . Telithromycin Itching and Rash  . Tetracycline Hcl Itching and Rash  . Topiramate Itching and Rash  . Valproic Acid Rash     Outpatient Medications Prior to Visit  Medication Sig Dispense Refill  . acetaminophen-codeine (TYLENOL #3) 300-30 MG tablet Take 1 tablet by mouth every 4 (four) hours as needed. 60 tablet 2  . albuterol (PROVENTIL) (2.5 MG/3ML) 0.083% nebulizer solution USE 1 VIAL IN NEBULIZER 2 TIMES DAILY. Generic: VENTOLIN 60 vial 5  . albuterol (VENTOLIN HFA) 108 (90 Base) MCG/ACT inhaler INHALE 1 TO 2 PUFFS INTO THE LUNGS EVERY 4 HOURS AS NEEDED FOR WHEEZING OR SHORTNESS OF BREATH 8.5 g 3  . aspirin 81 MG chewable tablet Chew by  mouth daily.    [name] atorvastatin (LIPITOR) 40 MG tablet TAKE 1 TABLET(40 MG) BY MOUTH DAILY 90 tablet 3  . B-D INSULIN SYRINGE 1CC/25GX1" 25G X 1" 1 ML MISC USE AS DIRECTED EVERY 2 WEEKS 50 each 5  . budesonide (PULMICORT) 0.25 MG/2ML nebulizer solution USE 1 VIAL IN NEBULIZER 2 TIMES DAILY. Generic: PULMICORT 120 mL 2  . calcium carbonate (OSCAL) 1500 (600 Ca) MG TABS tablet Take 600 mg of elemental calcium by mouth 2 (two) times daily with a meal.     . cariprazine (VRAYLAR) capsule Take 1 capsule (1.5 mg total) by mouth daily. Lot # [id number] EXP 0701/22 35 capsule 0  . chlorhexidine (PERIDEX) 0.12 % solution Use to wash mouth twice a day    . Cholecalciferol (VITAMIN D3) 50  MCG (2000 UT) capsule Take 1 capsule (2,000 Units total) by mouth daily. 100 capsule 3  . clonazePAM (KLONOPIN) 1 MG tablet TAKE 2 TABLETS BY MOUTH EVERY NIGHT AT BEDTIME AND 1/2 TABLET EXTRA IN DAYTIME AS NEEDED 225 tablet 1  . cyanocobalamin (,VITAMIN B-12,) 1000 MCG/ML injection INJECT 1ML IN THE MUSCLE EVERY 14 DAYS. 10 mL 5  . Cyanocobalamin (VITAMIN B-12 PO) Take 5,000 mcg by mouth daily.    . diphenoxylate-atropine (LOMOTIL) 2.5-0.025 MG tablet Take 1-2 tablets by mouth 4 (four) times daily as needed for diarrhea or loose stools. 30 tablet 1  . Docusate Calcium (STOOL SOFTENER PO) Take 100 mg by mouth as needed (for constipation).     [name] escitalopram (LEXAPRO) 20 MG tablet Take 1 tablet (20 mg total) by mouth daily. 90 tablet 3  . formoterol (PERFOROMIST) 20 MCG/2ML nebulizer solution Take 2 mLs (20 mcg total) by nebulization 2 (two) times daily. 120 mL 3  . furosemide (LASIX) 20 MG tablet TAKE 1 TABLET(20 MG) BY MOUTH DAILY AS NEEDED (Patient taking differently: Take 20 mg by mouth daily.) 90 tablet 3  . HYDROcodone-acetaminophen (NORCO/VICODIN) 5-325 MG tablet Take 1 tablet by mouth every 6 (six) hours as needed. Take 1 by mouth every 6 hours as needed    . ketoconazole (NIZORAL) 2 % cream Apply 1 application topically 2  (two) times daily. On rash 90 g 1  . ketoconazole (NIZORAL) 200 MG tablet Take 1 tablet (200 mg total) by mouth daily. 10 tablet 0  . levofloxacin (LEVAQUIN) 500 MG tablet Take 500 mg by mouth daily. Take 1 by mouth daily for 10 days    . levothyroxine (SYNTHROID) 88 MCG tablet TAKE 1 TABLET(88 MCG) BY MOUTH DAILY 90 tablet 3  . nitroGLYCERIN (NITROSTAT) 0.4 MG SL tablet USE AS DIRECTED SUBLIGUALLY 25 tablet 3  . Omega-3 Fatty Acids (FISH OIL) 1000 MG CAPS Take 1 capsule by mouth daily.    . phenytoin (DILANTIN) 100 MG ER capsule TAKE 2 CAPSULES BY MOUTH EVERY MORNING AND 1 CAPSULE EVERY EVENING 270 capsule 3  . PREVIDENT 5000 BOOSTER PLUS 1.1 % PSTE BRUSH BY MOUTH TWICE DAILY    . promethazine (PHENERGAN) 12.5 MG tablet 12.5 mg every 6 (six) hours as needed.     . Promethazine-Codeine 6.25-10 MG/5ML SOLN Take 5 mLs by mouth every 6 (six) hours as needed. Do not take with Tylenol 3 300 mL 1  . Respiratory Therapy Supplies (FLUTTER) DEVI Use after nebulization treatments 1 each 0  . sodium chloride (MURO 128) 5 % ophthalmic solution Place 1 drop into both eyes as needed for irritation.     . SYRINGE-NEEDLE, DISP, 3 ML (BD ECLIPSE SYRINGE) 25G X 1" 3 ML MISC Use sq q 2 wks 50 each 3  . traZODone (DESYREL) 50 MG tablet TAKE 4 TABLETS(200 MG) BY MOUTH AT BEDTIME 360 tablet 3  . verapamil (CALAN-SR) 240 MG CR tablet TAKE 1 TABLET(240 MG) BY MOUTH AT BEDTIME 90 tablet 3   No facility-administered medications prior to visit.    ROS: Review of Systems  Constitutional: Positive for fatigue. Negative for activity change, appetite change, chills and unexpected weight change.  HENT: Negative for congestion, mouth sores and sinus pressure.   Eyes: Negative for visual disturbance.  Respiratory: Positive for cough. Negative for chest tightness and shortness of breath.   Cardiovascular: Negative for palpitations and leg swelling.  Gastrointestinal: Negative for abdominal pain and nausea.  Genitourinary:  Negative for difficulty urinating, frequency and  vaginal pain.  Musculoskeletal: Positive for back pain. Negative for gait problem.  Skin: Negative for pallor and rash.  Neurological: Positive for weakness. Negative for dizziness, tremors, numbness and headaches.  Psychiatric/Behavioral: Negative for confusion, sleep disturbance and suicidal ideas. The patient is nervous/anxious.     Objective:  BP (!) 148/70 (BP Location: Left Arm)   Pulse 78   Temp 98.7 F (37.1 C) (Oral)   Ht 5' (1.524 m)   Wt 151 lb 6.4 oz (68.7 kg)   SpO2 97%   BMI 29.57 kg/m   BP Readings from Last 3 Encounters:  07/08/20 (!) 148/70  05/22/20 134/80  03/10/20 120/78    Wt Readings from Last 3 Encounters:  07/08/20 151 lb 6.4 oz (68.7 kg)  05/22/20 152 lb 3.2 oz (69 kg)  03/10/20 157 lb 9.6 oz (71.5 kg)    Physical Exam Constitutional:      General: She is not in acute distress.    Appearance: She is well-developed.  HENT:     Head: Normocephalic.     Right Ear: External ear normal.     Left Ear: External ear normal.     Nose: Nose normal.  Eyes:     General:        Right eye: No discharge.        Left eye: No discharge.     Conjunctiva/sclera: Conjunctivae normal.     Pupils: Pupils are equal, round, and reactive to light.  Neck:     Thyroid: No thyromegaly.     Vascular: No JVD.     Trachea: No tracheal deviation.  Cardiovascular:     Rate and Rhythm: Normal rate and regular rhythm.     Heart sounds: Normal heart sounds.  Pulmonary:     Effort: No respiratory distress.     Breath sounds: No stridor. No wheezing.  Abdominal:     General: Bowel sounds are normal. There is no distension.     Palpations: Abdomen is soft. There is no mass.     Tenderness: There is no abdominal tenderness. There is no guarding or rebound.  Musculoskeletal:        General: No tenderness.     Cervical back: Normal range of motion and neck supple.  Lymphadenopathy:     Cervical: No cervical adenopathy.   Skin:    Findings: No erythema or rash.  Neurological:     Mental Status: She is oriented to person, place, and time.     Cranial Nerves: No cranial nerve deficit.     Motor: No abnormal muscle tone.     Coordination: Coordination normal.     Gait: Gait abnormal.     Deep Tendon Reflexes: Reflexes normal.  Psychiatric:        Behavior: Behavior normal.        Thought Content: Thought content normal.        Judgment: Judgment normal.      Procedure: EKG Indication: chest pain Impression: NSR. Borderline QT. No acute changes.  Lab Results  Component Value Date   WBC 10.8 (H) 08/23/2019   HGB 12.4 08/23/2019   HCT 36.9 08/23/2019   PLT 279.0 08/23/2019   GLUCOSE 68 (L) 02/12/2020   CHOL 249 (H) 11/10/2018   TRIG 88.0 11/10/2018   HDL 57.70 11/10/2018   LDLDIRECT 165.0 09/26/2009   LDLCALC 174 (H) 11/10/2018   ALT 13 12/03/2019   AST 22 12/03/2019   NA 141 02/12/2020   K 3.8 02/12/2020  CL 98 02/12/2020   CREATININE 0.78 02/12/2020   BUN 5 (L) 02/12/2020   CO2 35 (H) 02/12/2020   TSH 2.22 11/10/2018   INR 1.0 ratio 11/07/2009   HGBA1C 5.6 08/23/2019    MR 3D Recon At Scanner  Result Date: 02/12/2019 CLINICAL DATA:  Right upper quadrant abdominal pain. Suspected cholecystitis. EXAM: MRI ABDOMEN WITHOUT AND WITH CONTRAST (INCLUDING MRCP) TECHNIQUE: Multiplanar multisequence MR imaging of the abdomen was performed both before and after the administration of intravenous contrast. Heavily T2-weighted images of the biliary and pancreatic ducts were obtained, and three-dimensional MRCP images were rendered by post processing. CONTRAST:  8mL GADAVIST GADOBUTROL 1 MMOL/ML IV SOLN COMPARISON:  Abdominal ultrasound 08/07/2015. Abdominal CT 08/31/2010. FINDINGS: Despite efforts by the technologist and patient, mild motion artifact is present on today's exam and could not be eliminated. This reduces exam sensitivity and specificity. Lower chest: There are no significant findings  within the visualized lower chest. Hepatobiliary: Small hepatic cysts are noted. There are new suspicious hepatic findings. The gallbladder is surgically absent. There is mild fusiform extrahepatic biliary dilatation with the common hepatic duct measuring up to 8 mm in diameter. The duct tapers distally and appears similar to previous CT. No evidence of choledocholithiasis or intrahepatic biliary dilatation. Pancreas: Unremarkable. No pancreatic ductal dilatation or surrounding inflammatory changes. Stable prominent fat between the pancreatic head and the superior mesenteric vein. Spleen: Normal in size without focal abnormality. Adrenals/Urinary Tract: Both adrenal glands appear normal. Small right renal cyst. No evidence of renal mass or hydronephrosis. Stomach/Bowel: No evidence of bowel wall thickening, distention or surrounding inflammatory change. Vascular/Lymphatic: There are no enlarged abdominal lymph nodes. Aortic and branch vessel atherosclerosis. No acute vascular findings identified. Other: The visualized anterior abdominal wall appears intact. No ascites. Musculoskeletal: No acute or significant osseous findings. Facet hypertrophy and associated synovial enhancement noted within the mid lumbar spine. IMPRESSION: 1. No acute findings or explanation for the patient's symptoms. 2. Mild fusiform extrahepatic biliary dilatation status post cholecystectomy, similar to previous CT. No evidence of choledocholithiasis or intrahepatic biliary dilatation. 3.  Aortic Atherosclerosis (ICD10-I70.0). Electronically Signed   By: Richardean Sale M.D.   On: 02/12/2019 13:07   MR ABD/MRCP  Result Date: 02/12/2019 CLINICAL DATA:  Right upper quadrant abdominal pain. Suspected cholecystitis. EXAM: MRI ABDOMEN WITHOUT AND WITH CONTRAST (INCLUDING MRCP) TECHNIQUE: Multiplanar multisequence MR imaging of the abdomen was performed both before and after the administration of intravenous contrast. Heavily T2-weighted images  of the biliary and pancreatic ducts were obtained, and three-dimensional MRCP images were rendered by post processing. CONTRAST:  62mL GADAVIST GADOBUTROL 1 MMOL/ML IV SOLN COMPARISON:  Abdominal ultrasound 08/07/2015. Abdominal CT 08/31/2010. FINDINGS: Despite efforts by the technologist and patient, mild motion artifact is present on today's exam and could not be eliminated. This reduces exam sensitivity and specificity. Lower chest: There are no significant findings within the visualized lower chest. Hepatobiliary: Small hepatic cysts are noted. There are new suspicious hepatic findings. The gallbladder is surgically absent. There is mild fusiform extrahepatic biliary dilatation with the common hepatic duct measuring up to 8 mm in diameter. The duct tapers distally and appears similar to previous CT. No evidence of choledocholithiasis or intrahepatic biliary dilatation. Pancreas: Unremarkable. No pancreatic ductal dilatation or surrounding inflammatory changes. Stable prominent fat between the pancreatic head and the superior mesenteric vein. Spleen: Normal in size without focal abnormality. Adrenals/Urinary Tract: Both adrenal glands appear normal. Small right renal cyst. No evidence of renal mass or hydronephrosis. Stomach/Bowel: No  evidence of bowel wall thickening, distention or surrounding inflammatory change. Vascular/Lymphatic: There are no enlarged abdominal lymph nodes. Aortic and branch vessel atherosclerosis. No acute vascular findings identified. Other: The visualized anterior abdominal wall appears intact. No ascites. Musculoskeletal: No acute or significant osseous findings. Facet hypertrophy and associated synovial enhancement noted within the mid lumbar spine. IMPRESSION: 1. No acute findings or explanation for the patient's symptoms. 2. Mild fusiform extrahepatic biliary dilatation status post cholecystectomy, similar to previous CT. No evidence of choledocholithiasis or intrahepatic biliary  dilatation. 3.  Aortic Atherosclerosis (ICD10-I70.0). Electronically Signed   By: Richardean Sale M.D.   On: 02/12/2019 13:07    Assessment & Plan:     Walker Kehr, MD

## 2020-07-15 ENCOUNTER — Encounter: Payer: Self-pay | Admitting: Internal Medicine

## 2020-07-15 NOTE — Assessment & Plan Note (Signed)
chronic due to Phenytoin side effect

## 2020-07-15 NOTE — Assessment & Plan Note (Signed)
Prom-cod for cough prn  Potential benefits of a long term opioids use as well as potential risks (i.e. addiction risk, apnea etc) and complications (i.e. Somnolence, constipation and others) were explained to the patient and were aknowledged.

## 2020-07-15 NOTE — Assessment & Plan Note (Signed)
Continue with Lexapro,  Vraylar low dose

## 2020-07-15 NOTE — Assessment & Plan Note (Signed)
The patient should be medically clear for multiple teeth extraction, torus palatinus surgery under IV sedation w/Versed and Fentanyl.  Thank you,

## 2020-07-21 ENCOUNTER — Ambulatory Visit (INDEPENDENT_AMBULATORY_CARE_PROVIDER_SITE_OTHER): Payer: Medicare Other | Admitting: Psychology

## 2020-07-21 DIAGNOSIS — F331 Major depressive disorder, recurrent, moderate: Secondary | ICD-10-CM

## 2020-08-04 ENCOUNTER — Other Ambulatory Visit: Payer: Self-pay | Admitting: *Deleted

## 2020-08-04 ENCOUNTER — Ambulatory Visit: Payer: Medicare Other | Admitting: Psychology

## 2020-08-04 NOTE — Patient Outreach (Signed)
[facility]) Care Management  08/04/2020  Veronica [name] 1950/09/21 [id number]  Unsuccessful telephone outreach call to patient. HIPAA identifiers obtained. Patient answered the phone and stated that she has all that she needs after her oral surgery today. Patient explained that she is having a hard time speaking and that she is in a lot of pain. She requested a call back at a later date. Patient did not have any further questions or concerns today and did confirm that she has this nurse's contact number to call her if needed.    Plan: Nurse will call patient within the next several business days to ensure she continues to do well after her oral surgery.  Emelia Loron RN, [place] [phone] [email or web address]

## 2020-08-05 ENCOUNTER — Other Ambulatory Visit: Payer: Self-pay | Admitting: *Deleted

## 2020-08-05 NOTE — Patient Outreach (Signed)
[facility]) Care Management  08/05/2020  ASTRYD PEARCY [birth year] [id number]  Nurse called patient to inquire if she had any concerns or needs after her oral surgery she had on 08/04/20. Nurse LVM requesting that the patient contact this nurse for any concerns, assistance, or questions she may have or need.  Plan: RN Health Coach will call patient within the month of June.  Emelia Loron RN, BSN [place] [phone] [email or web address]

## 2020-08-07 ENCOUNTER — Ambulatory Visit (INDEPENDENT_AMBULATORY_CARE_PROVIDER_SITE_OTHER): Payer: Medicare Other | Admitting: Psychology

## 2020-08-07 DIAGNOSIS — F331 Major depressive disorder, recurrent, moderate: Secondary | ICD-10-CM

## 2020-08-09 ENCOUNTER — Other Ambulatory Visit: Payer: Self-pay | Admitting: Internal Medicine

## 2020-08-12 ENCOUNTER — Ambulatory Visit: Payer: Medicare Other | Admitting: Psychology

## 2020-08-18 ENCOUNTER — Ambulatory Visit (INDEPENDENT_AMBULATORY_CARE_PROVIDER_SITE_OTHER): Payer: Medicare Other | Admitting: Psychology

## 2020-08-18 DIAGNOSIS — F331 Major depressive disorder, recurrent, moderate: Secondary | ICD-10-CM

## 2020-08-19 ENCOUNTER — Other Ambulatory Visit: Payer: Self-pay | Admitting: *Deleted

## 2020-08-19 NOTE — Patient Outreach (Signed)
[place] [facility]) Care Management  08/19/2020  Veronica [name] [birth year] [id number]  Unsuccessful outreach attempt made to patient. Patient answered the phone and stated that she would not be able to speak today because she is has a oral surgery follow up appointment. Patient shared that the oral surgery was extensive and that she has been in a lot of pain. She continues to supplement with Ensure and feels that she has everything that she needs at this time. She did request that this nurse call back at a later date.   Plan: RN Health Coach will call patient within the month of June.  Emelia Loron RN, BSN [place] [phone] [email or web address]

## 2020-08-26 ENCOUNTER — Encounter: Payer: Self-pay | Admitting: Internal Medicine

## 2020-08-26 ENCOUNTER — Ambulatory Visit (INDEPENDENT_AMBULATORY_CARE_PROVIDER_SITE_OTHER): Payer: Medicare Other | Admitting: Internal Medicine

## 2020-08-26 ENCOUNTER — Other Ambulatory Visit: Payer: Self-pay

## 2020-08-26 VITALS — BP 130/66 | HR 99 | Temp 97.9°F | Ht 60.0 in | Wt 140.8 lb

## 2020-08-26 DIAGNOSIS — G40909 Epilepsy, unspecified, not intractable, without status epilepticus: Secondary | ICD-10-CM

## 2020-08-26 DIAGNOSIS — J42 Unspecified chronic bronchitis: Secondary | ICD-10-CM | POA: Diagnosis not present

## 2020-08-26 DIAGNOSIS — E538 Deficiency of other specified B group vitamins: Secondary | ICD-10-CM | POA: Diagnosis not present

## 2020-08-26 DIAGNOSIS — B370 Candidal stomatitis: Secondary | ICD-10-CM

## 2020-08-26 DIAGNOSIS — E876 Hypokalemia: Secondary | ICD-10-CM

## 2020-08-26 DIAGNOSIS — G4709 Other insomnia: Secondary | ICD-10-CM

## 2020-08-26 DIAGNOSIS — R7989 Other specified abnormal findings of blood chemistry: Secondary | ICD-10-CM

## 2020-08-26 DIAGNOSIS — R197 Diarrhea, unspecified: Secondary | ICD-10-CM

## 2020-08-26 LAB — CBC WITH DIFFERENTIAL/PLATELET
Basophils Absolute: 0.1 10*3/uL (ref 0.0–0.1)
Basophils Relative: 0.6 % (ref 0.0–3.0)
Eosinophils Absolute: 0.1 10*3/uL (ref 0.0–0.7)
Eosinophils Relative: 0.7 % (ref 0.0–5.0)
HCT: 42.8 % (ref 36.0–46.0)
Hemoglobin: 14.5 g/dL (ref 12.0–15.0)
Lymphocytes Relative: 14.8 % (ref 12.0–46.0)
Lymphs Abs: 1.8 10*3/uL (ref 0.7–4.0)
MCHC: 33.8 g/dL (ref 30.0–36.0)
MCV: 88.2 fl (ref 78.0–100.0)
Monocytes Absolute: 1.6 10*3/uL — ABNORMAL HIGH (ref 0.1–1.0)
Monocytes Relative: 13.8 % — ABNORMAL HIGH (ref 3.0–12.0)
Neutro Abs: 8.3 10*3/uL — ABNORMAL HIGH (ref 1.4–7.7)
Neutrophils Relative %: 70.1 % (ref 43.0–77.0)
Platelets: 345 10*3/uL (ref 150.0–400.0)
RBC: 4.85 Mil/uL (ref 3.87–5.11)
RDW: 14.7 % (ref 11.5–15.5)
WBC: 11.8 10*3/uL — ABNORMAL HIGH (ref 4.0–10.5)

## 2020-08-26 LAB — COMPREHENSIVE METABOLIC PANEL
ALT: 45 U/L — ABNORMAL HIGH (ref 0–35)
AST: 52 U/L — ABNORMAL HIGH (ref 0–37)
Albumin: 4.2 g/dL (ref 3.5–5.2)
Alkaline Phosphatase: 120 U/L — ABNORMAL HIGH (ref 39–117)
BUN: 12 mg/dL (ref 6–23)
CO2: 24 mEq/L (ref 19–32)
Calcium: 7.9 mg/dL — ABNORMAL LOW (ref 8.4–10.5)
Chloride: 101 mEq/L (ref 96–112)
Creatinine, Ser: 0.66 mg/dL (ref 0.40–1.20)
GFR: 88.25 mL/min (ref >60.00)
Glucose, Bld: 109 mg/dL — ABNORMAL HIGH (ref 70–99)
Potassium: 3.4 mEq/L — ABNORMAL LOW (ref 3.5–5.1)
Sodium: 137 mEq/L (ref 135–145)
Total Bilirubin: 0.4 mg/dL (ref 0.2–1.2)
Total Protein: 7.3 g/dL (ref 6.0–8.3)

## 2020-08-26 LAB — LIPASE: Lipase: 16 U/L (ref 11.0–59.0)

## 2020-08-26 MED ORDER — CYANOCOBALAMIN 1000 MCG/ML IJ SOLN
1000.0000 ug | Freq: Once | INTRAMUSCULAR | Status: AC
  Administered 2020-08-26: 1000 ug via INTRAMUSCULAR

## 2020-08-26 MED ORDER — METHYLPREDNISOLONE ACETATE 80 MG/ML IJ SUSP
80.0000 mg | Freq: Once | INTRAMUSCULAR | Status: AC
  Administered 2020-08-26: 80 mg via INTRAMUSCULAR

## 2020-08-26 MED ORDER — TRAZODONE HCL 50 MG PO TABS: ORAL_TABLET | ORAL | 3 refills | Status: DC

## 2020-08-26 MED ORDER — ESCITALOPRAM OXALATE 20 MG PO TABS: 20.0000 mg | ORAL_TABLET | Freq: Every day | ORAL | 3 refills | Status: DC

## 2020-08-26 MED ORDER — CLONAZEPAM 1 MG PO TABS: ORAL_TABLET | ORAL | 1 refills | Status: DC

## 2020-08-26 MED ORDER — LEVOTHYROXINE SODIUM 88 MCG PO TABS: ORAL_TABLET | ORAL | 3 refills | Status: DC

## 2020-08-26 MED ORDER — PROMETHAZINE HCL 25 MG PO TABS: 25.0000 mg | ORAL_TABLET | Freq: Three times a day (TID) | ORAL | 0 refills | Status: DC | PRN

## 2020-08-26 MED ORDER — DIPHENOXYLATE-ATROPINE 2.5-0.025 MG PO TABS: 1.0000 | ORAL_TABLET | Freq: Four times a day (QID) | ORAL | 1 refills | Status: DC | PRN

## 2020-08-26 MED ORDER — VERAPAMIL HCL ER 240 MG PO TBCR: EXTENDED_RELEASE_TABLET | ORAL | 3 refills | Status: DC

## 2020-08-26 NOTE — Assessment & Plan Note (Signed)
Recurrent Stool for C diff Lomotil prn

## 2020-08-26 NOTE — Progress Notes (Addendum)
Subjective:  Patient ID: Veronica [name], female    DOB: [birth year]  Age: 70 y.o. 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 60 tablet 2   albuterol (PROVENTIL) (2.5 MG/3ML) 0.083% nebulizer solution USE 1 VIAL IN NEBULIZER 2 TIMES DAILY. Generic: VENTOLIN 60 vial 5   albuterol (VENTOLIN HFA) 108 (90 Base) MCG/ACT inhaler INHALE 1 TO 2 PUFFS INTO THE LUNGS EVERY 4 HOURS AS NEEDED FOR WHEEZING OR SHORTNESS OF BREATH 8.5 g 3   aspirin 81 MG chewable tablet Chew by mouth daily.     atorvastatin (LIPITOR) 40 MG tablet TAKE 1 TABLET(40 MG) BY MOUTH DAILY 90 tablet 3   B-D INSULIN SYRINGE 1CC/25GX1" 25G X 1" 1 ML MISC USE AS DIRECTED EVERY 2 WEEKS 50 each 5   budesonide (PULMICORT) 0.25 MG/2ML nebulizer solution USE 1 VIAL IN NEBULIZER 2 TIMES DAILY. Generic: PULMICORT 120 mL 2   calcium carbonate (OSCAL) 1500 (600 Ca) MG TABS tablet Take 600 mg of elemental calcium by mouth 2 (two) times daily with a meal.      cariprazine (VRAYLAR) capsule Take 1 capsule (1.5 mg total) by mouth daily. Lot # [id number] EXP 0701/22 35 capsule 0   chlorhexidine (PERIDEX) 0.12 % solution Use to wash mouth twice a day     Cholecalciferol (VITAMIN D3) 50 MCG (2000 UT) capsule Take 1 capsule (2,000 Units total) by mouth daily. 100 capsule 3   clonazePAM (KLONOPIN) 1 MG tablet TAKE 2 TABLETS BY MOUTH EVERY NIGHT AT BEDTIME AND 1/2 TABLET EXTRA IN DAYTIME AS NEEDED 225 tablet 1   clotrimazole (LOTRIMIN) 1 % cream Apply 1 application topically. Apply to the corner of mouth twice a day      clotrimazole (MYCELEX) 10 MG troche Tale 1 by mouth five times a day     cyanocobalamin (,VITAMIN B-12,) 1000 MCG/ML injection INJECT 1ML IN THE MUSCLE EVERY 14 DAYS. 10 mL 5   diphenoxylate-atropine (LOMOTIL) 2.5-0.025 MG tablet Take 1-2 tablets by mouth 4 (four) times daily as needed for diarrhea or loose stools. 30 tablet 1   Docusate Calcium (STOOL SOFTENER PO) Take 100 mg by mouth as needed (for constipation).      escitalopram (LEXAPRO) 20 MG tablet Take 1 tablet (20 mg total) by mouth daily. 90 tablet 3   formoterol (PERFOROMIST) 20 MCG/2ML nebulizer solution Take 2 mLs (20 mcg total) by nebulization 2 (two) times daily. 120 mL 3   furosemide (LASIX) 20 MG tablet TAKE 1 TABLET(20 MG) BY MOUTH DAILY AS NEEDED (Patient taking differently: Take 20 mg by mouth daily.) 90 tablet 3   HYDROcodone-acetaminophen (NORCO/VICODIN) 5-325 MG tablet Take 1 tablet by mouth every 6 (six) hours as needed. Take 1 by mouth every 6 hours as needed     ketoconazole (NIZORAL) 2 % cream Apply 1 application topically 2 (two) times daily. On rash 90 g 1   levothyroxine (SYNTHROID) 88 MCG tablet TAKE 1 TABLET(88 MCG) BY MOUTH DAILY 90 tablet 3   nitroGLYCERIN (NITROSTAT) 0.4 MG SL tablet TAKE UNDER THE TONGUE AS DIRECTED 25 tablet 3  Omega-3 Fatty Acids (FISH OIL) 1000 MG CAPS Take 1 capsule by mouth daily.     phenytoin (DILANTIN) 100 MG ER capsule TAKE 2 CAPSULES BY MOUTH EVERY MORNING AND 1 CAPSULE EVERY EVENING 270 capsule 3   PREVIDENT 5000 BOOSTER PLUS 1.1 % PSTE BRUSH BY MOUTH TWICE DAILY     Promethazine-Codeine 6.25-10 MG/5ML SOLN Take 5 mLs by mouth every 6 (six) hours as needed. Do not take with Tylenol 3 300 mL 1   Respiratory Therapy Supplies (FLUTTER) DEVI Use after nebulization treatments 1 each 0   sodium chloride (MURO 128) 5 % ophthalmic solution Place 1 drop into both eyes as needed for irritation.      SYRINGE-NEEDLE, DISP, 3 ML (BD ECLIPSE SYRINGE) 25G X 1" 3 ML MISC Use sq q 2 wks 50 each 3    traZODone (DESYREL) 50 MG tablet TAKE 4 TABLETS(200 MG) BY MOUTH AT BEDTIME 360 tablet 3   verapamil (CALAN-SR) 240 MG CR tablet TAKE 1 TABLET(240 MG) BY MOUTH AT BEDTIME 90 tablet 3   Cyanocobalamin (VITAMIN B-12 PO) Take 5,000 mcg by mouth daily.     ketoconazole (NIZORAL) 200 MG tablet Take 1 tablet (200 mg total) by mouth daily. (Patient not taking: Reported on 08/26/2020) 10 tablet 0   levofloxacin (LEVAQUIN) 500 MG tablet Take 500 mg by mouth daily. Take 1 by mouth daily for 10 days (Patient not taking: Reported on 08/26/2020)     promethazine (PHENERGAN) 12.5 MG tablet 12.5 mg every 6 (six) hours as needed.      No facility-administered medications prior to visit.    ROS: Review of Systems  Constitutional:  Positive for unexpected weight change. Negative for activity change, appetite change, chills, fatigue and fever.  HENT:  Positive for mouth sores. Negative for congestion and sinus pressure.   Eyes:  Negative for visual disturbance.  Respiratory:  Negative for cough and chest tightness.   Gastrointestinal:  Positive for diarrhea and nausea. Negative for abdominal pain and vomiting.  Genitourinary:  Negative for difficulty urinating, frequency and vaginal pain.  Musculoskeletal:  Positive for gait problem. Negative for back pain.  Skin:  Negative for pallor and rash.  Neurological:  Negative for dizziness, tremors, weakness, numbness and headaches.  Psychiatric/Behavioral:  Positive for dysphoric mood and sleep disturbance. Negative for confusion and decreased concentration. The patient is nervous/anxious.    Objective:  BP 130/66 (BP Location: Left Arm)   Pulse 99   Temp 97.9 F (36.6 C) (Oral)   Ht 5' (1.524 m)   Wt 140 lb 12.8 oz (63.9 kg)   SpO2 94%   BMI 27.50 kg/m   BP Readings from Last 3 Encounters:  08/26/20 130/66  07/08/20 (!) 148/70  05/22/20 134/80    Wt Readings from Last 3 Encounters:  08/26/20 140 lb 12.8 oz (63.9 kg)  07/08/20 151 lb 6.4 oz (68.7 kg)   05/22/20 152 lb 3.2 oz (69 kg)    Physical Exam Constitutional:      General: She is not in acute distress.    Appearance: Normal appearance. She is well-developed. She is not toxic-appearing.  HENT:     Head: Normocephalic.     Right Ear: External ear normal.     Left Ear: External ear normal.     Nose: Nose normal.  Eyes:     General:        Right eye: No discharge.        Left eye: No discharge.  Conjunctiva/sclera: Conjunctivae normal.     Pupils: Pupils are equal, round, and reactive to light.  Neck:     Thyroid: No thyromegaly.     Vascular: No JVD.     Trachea: No tracheal deviation.  Cardiovascular:     Rate and Rhythm: Normal rate and regular rhythm.     Heart sounds: Normal heart sounds.  Pulmonary:     Effort: No respiratory distress.     Breath sounds: No stridor. No wheezing.  Abdominal:     General: Bowel sounds are normal. There is no distension.     Palpations: Abdomen is soft. There is no mass.     Tenderness: There is no abdominal tenderness. There is no guarding or rebound.  Musculoskeletal:        General: No tenderness.     Cervical back: Normal range of motion and neck supple. No rigidity.  Lymphadenopathy:     Cervical: No cervical adenopathy.  Skin:    Findings: No erythema or rash.  Neurological:     Mental Status: She is oriented to person, place, and time.     Cranial Nerves: No cranial nerve deficit.     Motor: No abnormal muscle tone.     Coordination: Coordination normal.     Deep Tendon Reflexes: Reflexes normal.  Psychiatric:        Behavior: Behavior normal.        Thought Content: Thought content normal.        Judgment: Judgment normal.  Using ane Edentulous Looks tired Hard palate with postop changes.  No thrush.  Lab Results  Component Value Date   WBC 10.8 (H) 08/23/2019   HGB 12.4 08/23/2019   HCT 36.9 08/23/2019   PLT 279.0 08/23/2019   GLUCOSE 68 (L) 02/12/2020   CHOL 249 (H) 11/10/2018   TRIG 88.0 11/10/2018    HDL 57.70 11/10/2018   LDLDIRECT 165.0 09/26/2009   LDLCALC 174 (H) 11/10/2018   ALT 13 12/03/2019   AST 22 12/03/2019   NA 141 02/12/2020   K 3.8 02/12/2020   CL 98 02/12/2020   CREATININE 0.78 02/12/2020   BUN 5 (L) 02/12/2020   CO2 35 (H) 02/12/2020   TSH 2.22 11/10/2018   INR 1.0 ratio 11/07/2009   HGBA1C 5.6 08/23/2019    MR 3D Recon At Scanner  Result Date: 02/12/2019 CLINICAL DATA:  Right upper quadrant abdominal pain. Suspected cholecystitis. EXAM: MRI ABDOMEN WITHOUT AND WITH CONTRAST (INCLUDING MRCP) TECHNIQUE: Multiplanar multisequence MR imaging of the abdomen was performed both before and after the administration of intravenous contrast. Heavily T2-weighted images of the biliary and pancreatic ducts were obtained, and three-dimensional MRCP images were rendered by post processing. CONTRAST:  15mL GADAVIST GADOBUTROL 1 MMOL/ML IV SOLN COMPARISON:  Abdominal ultrasound 08/07/2015. Abdominal CT 08/31/2010. FINDINGS: Despite efforts by the technologist and patient, mild motion artifact is present on today's exam and could not be eliminated. This reduces exam sensitivity and specificity. Lower chest: There are no significant findings within the visualized lower chest. Hepatobiliary: Small hepatic cysts are noted. There are new suspicious hepatic findings. The gallbladder is surgically absent. There is mild fusiform extrahepatic biliary dilatation with the common hepatic duct measuring up to 8 mm in diameter. The duct tapers distally and appears similar to previous CT. No evidence of choledocholithiasis or intrahepatic biliary dilatation. Pancreas: Unremarkable. No pancreatic ductal dilatation or surrounding inflammatory changes. Stable prominent fat between the pancreatic head and the superior mesenteric vein. Spleen: Normal in size without focal  abnormality. Adrenals/Urinary Tract: Both adrenal glands appear normal. Small right renal cyst. No evidence of renal mass or hydronephrosis.  Stomach/Bowel: No evidence of bowel wall thickening, distention or surrounding inflammatory change. Vascular/Lymphatic: There are no enlarged abdominal lymph nodes. Aortic and branch vessel atherosclerosis. No acute vascular findings identified. Other: The visualized anterior abdominal wall appears intact. No ascites. Musculoskeletal: No acute or significant osseous findings. Facet hypertrophy and associated synovial enhancement noted within the mid lumbar spine. IMPRESSION: 1. No acute findings or explanation for the patient's symptoms. 2. Mild fusiform extrahepatic biliary dilatation status post cholecystectomy, similar to previous CT. No evidence of choledocholithiasis or intrahepatic biliary dilatation. 3.  Aortic Atherosclerosis (ICD10-I70.0). Electronically Signed   By: Richardean Sale M.D.   On: 02/12/2019 13:07   MR ABD/MRCP  Result Date: 02/12/2019 CLINICAL DATA:  Right upper quadrant abdominal pain. Suspected cholecystitis. EXAM: MRI ABDOMEN WITHOUT AND WITH CONTRAST (INCLUDING MRCP) TECHNIQUE: Multiplanar multisequence MR imaging of the abdomen was performed both before and after the administration of intravenous contrast. Heavily T2-weighted images of the biliary and pancreatic ducts were obtained, and three-dimensional MRCP images were rendered by post processing. CONTRAST:  75mL GADAVIST GADOBUTROL 1 MMOL/ML IV SOLN COMPARISON:  Abdominal ultrasound 08/07/2015. Abdominal CT 08/31/2010. FINDINGS: Despite efforts by the technologist and patient, mild motion artifact is present on today's exam and could not be eliminated. This reduces exam sensitivity and specificity. Lower chest: There are no significant findings within the visualized lower chest. Hepatobiliary: Small hepatic cysts are noted. There are new suspicious hepatic findings. The gallbladder is surgically absent. There is mild fusiform extrahepatic biliary dilatation with the common hepatic duct measuring up to 8 mm in diameter. The duct  tapers distally and appears similar to previous CT. No evidence of choledocholithiasis or intrahepatic biliary dilatation. Pancreas: Unremarkable. No pancreatic ductal dilatation or surrounding inflammatory changes. Stable prominent fat between the pancreatic head and the superior mesenteric vein. Spleen: Normal in size without focal abnormality. Adrenals/Urinary Tract: Both adrenal glands appear normal. Small right renal cyst. No evidence of renal mass or hydronephrosis. Stomach/Bowel: No evidence of bowel wall thickening, distention or surrounding inflammatory change. Vascular/Lymphatic: There are no enlarged abdominal lymph nodes. Aortic and branch vessel atherosclerosis. No acute vascular findings identified. Other: The visualized anterior abdominal wall appears intact. No ascites. Musculoskeletal: No acute or significant osseous findings. Facet hypertrophy and associated synovial enhancement noted within the mid lumbar spine. IMPRESSION: 1. No acute findings or explanation for the patient's symptoms. 2. Mild fusiform extrahepatic biliary dilatation status post cholecystectomy, similar to previous CT. No evidence of choledocholithiasis or intrahepatic biliary dilatation. 3.  Aortic Atherosclerosis (ICD10-I70.0). Electronically Signed   By: Richardean Sale M.D.   On: 02/12/2019 13:07    Assessment & Plan:     Walker Kehr, MD

## 2020-08-26 NOTE — Addendum Note (Signed)
Addended by: Jacobo Forest on: 08/26/2020 01:33 PM   Modules accepted: Orders

## 2020-08-26 NOTE — Assessment & Plan Note (Signed)
Due to diarrhea.  Eat 1 or 2 bananas a day to replenish potassium

## 2020-08-26 NOTE — Addendum Note (Signed)
Addended by: Jacobo Forest on: 08/26/2020 11:58 AM   Modules accepted: Orders

## 2020-08-26 NOTE — Assessment & Plan Note (Signed)
Continue with clonazepam prn  Potential benefits of a long term benzodiazepines  use as well as potential risks  and complications were explained to the patient and were aknowledged.

## 2020-08-26 NOTE — Addendum Note (Signed)
Addended by: Cassandria Anger on: 08/26/2020 05:07 PM   Modules accepted: Level of Service

## 2020-08-26 NOTE — Addendum Note (Signed)
Addended by: Earnstine Regal on: 08/26/2020 11:52 AM   Modules accepted: Orders

## 2020-08-26 NOTE — Assessment & Plan Note (Signed)
On Clotrimazole lozinges

## 2020-08-26 NOTE — Assessment & Plan Note (Signed)
New.  Unclear etiology.  Could be related to recent antibiotics/antifungal meds.  Discontinue Mycelex lozenges.  Repeat labs

## 2020-08-27 LAB — PHENYTOIN LEVEL, TOTAL: Phenytoin, Total: 7.6 mg/L — ABNORMAL LOW (ref 10.0–20.0)

## 2020-08-28 ENCOUNTER — Other Ambulatory Visit: Payer: Self-pay | Admitting: *Deleted

## 2020-08-28 LAB — SPECIMEN STATUS REPORT

## 2020-08-28 LAB — CLOSTRIDIUM DIFFICILE EIA: C difficile Toxins A+B, EIA: NEGATIVE

## 2020-08-28 NOTE — Patient Instructions (Addendum)
Goals Addressed             This Visit's Progress    [facility]) Patient will verbalize continuation of eating healthy for the next 90 days       Timeframe:  Long-Range Goal Priority:  Medium Start Date: 12/19/19                             Expected End Date: 01/11/21                     Follow Up Date 12/12/20   - set goal weight - manage portion size - prepare main meal at home 3 to 5 days each week - read food labels for fat, fiber, carbohydrates and portion size - set a realistic goal   - drink at least 4 glasses of water daily -Discussed drinking Pedialyte/Gatorade to stay hydrated  -Discussed eating high probiotic yogurt  -Encouraged patient to eat small amounts frequently       Why is this important?   When you are ready to manage your nutrition or weight, having a plan and setting goals will help.  Taking small steps to change how you eat and exercise is a good place to start.    Notes: Patient monitors sodium content in foods and reports she is making healthier food choices. Nurse sent patient Planning Healthy Meals booklet.   Updated 04/16/20: Patient states she will try to eat 3 servings of fruits/vegetables daily, monitor her salt intake, and will drink 4 glasses of water daily. States she does want to lose weight by continuing to eat healthier, do chair exercises and be as active as tolerated.   Updated 08/27/20: Patient is limited with what she can eat presently due to extensive oral surgery. She is drinking Boost, Ensure, and drinking as much water as possible to stay hydrated. Discussed drinking Pedialyte/Gatorade as needed.      [facility]) Patient will verbalize continuation of tracking and monitoring her COPD symptoms for the next 90 days       Timeframe:  Long-Range Goal Priority:  High Start Date:   12/19/19                          Expected End Date:  01/11/21                    Follow Up Date 12/12/20   - develop a rescue plan - eliminate symptom triggers at home -  follow rescue plan if symptoms flare-up - keep follow-up appointments  -Discussed staying aware of avoiding triggers that cause exacerbations -Encouraged rest and purse lip breathing as needed  -Encouraged continuation of drinking boost, staying hydrated, and increasing her diet to soft foods as tolerated. -Discussed eating yogurt with probiotics to help with healthy limiting diarrhea -Discussed drinking Pedialyte as needed to avoid hydration  Why is this important?   Tracking your symptoms and other information about your health helps your doctor plan your care.  Write down the symptoms, the time of day, what you were doing and what medicine you are taking.  You will soon learn how to manage your symptoms.     Notes: Patient does eliminate symptom triggers at home. Nurse sent her COPD zones and action plans. Patient does contact her provider as needed when she has an COPD exacerbation. Updated: 01/29/20 Updated 04/16/20: Patient tracks her COPD symptoms daily and  calls her providers when symptoms start to get out of her baseline. Patient limits her outside activity during very cold and hot days. She has eliminated triggers within her home. Patient follows her COPD regiment at home daily of taking her inhalers, nebulizers, and using flutter device.   Updated 07/08/20: Patient currently is undergoing dental issues where she is having multiple ongoing surgeries. Due to the steroids in her breathing treatments she is limited about using them routinely to avoid infection. She reports her COPD is at baseline currently and she is using Peridex mouthwash. Patient continues to track her COPD symptoms daily and calls her providers when symptoms start to get out of her baseline. Patient limits her outside activity during very cold and hot days. She has eliminated triggers within her home.   Updated 08/27/20: Patient reports she continues to be limited with using her respiratory medications due to her recovering  from surgery. Patient explains that currently her COPD is at baseline.       [facility]) Patient will verbalize maintaining her weight during the long-term ongoing oral surgeries she will ecounter within the next 90 days       Timeframe:  Long-Range Goal Priority:  High Start Date:  07/08/20                           Expected End Date: 02/11/21 Follow Up Date: 12/12/20  -Discussed continuation of drinking Boost and Ensure with high protein for healing and calories -Encouraged continuation of drinking 4 glasses of water daily to maintain hydration -Discussed continuation of eating soft foods  -Discussed continuation of strict oral hygiene to avoid mouth infection -Discussed breathing medications have steroids which can lead to mouth infection and encouraged patient to continue to rinse her mouth and use Peridex mouthwash after taking breathing medications -Discussed weighing daily for better awareness of weight loss -Discussed drinking Pedialyte/Gatorade to stay hydrated  -Discussed eating high probiotic yogurt  -Encouraged patient to eat small amounts frequently  Notes: Patient reports losing approximately 8 pounds from continuation of dental and mouth surgeries. Nurse will send patient a scale for daily weight monitoring and will send Ensure coupons.                     Updated 08/27/20: Patient reports that she has lost 11 pounds since her oral surgery. She states that she developed severe diarrhea and has had a hard time with maintaining her weight. Patient did go to her PCP and discussed her diarrhea and fatigue. She adds that her diarrhea has stopped and she is beginning to feel some better.       [facility]) Patient will verbalize managing her fatigue for the next 90 days       Timeframe:  Long-Range Goal Priority:  High Start Date:  12/19/19                           Expected End Date:  01/11/21                     Follow Up Date 12/12/20   - eat healthy - get at least 7 to 8 hours of sleep  at night - get outdoors every day (weather permitting) - keep room cool and dark - practice relaxation or meditation daily - use devices that will help like a cane, sock-puller or reacher   -Discussed drinking Pedialyte/Gatorade to stay  hydrated  -Discussed eating high probiotic yogurt  -Encouraged patient to eat small amounts frequently  Why is this important?   Feeling tired or worn out is a common symptom of COPD (chronic obstructive pulmonary disease).  Learning when you feel your best and when you need rest is important.  Managing the tiredness (fatigue) will help you be active and enjoy life.     Notes: Nurse sent patient Planning Healthy Meals booklet. Patient does get out of the house daily (weather and breathing permitting). She does use a straight cane and plans to discuss getting a quad cane with PCP during her upcoming appointment on 02/28/20. Updated: 01/29/20   Updated 04/16/20: Patient states she does sleep well currently. She does purse lip breathing and rests when she become short of breath. Patient uses her cane and plans to ask provider for a prescription for a quad cane on her next follow up visit. Patient goes out daily weather permitting to get her ice tea at [employer]. Patient states she is going to eat healthier by increasing her fruit/vegetable intake to 3 servings per day and limit her salt intake.  Updated 08/27/20: Patient reports feeling fatigued do to recovering from her recent extensive mouth surgery, being limited by what she is able to eat, and getting over a long period of diarrhea. Patient discussed her fatigue with her PCP on 08/26/20 . PCP ordered follow-up labs and give a B-12 injection.

## 2020-08-28 NOTE — Patient Outreach (Signed)
[place] [facility]) Care Management  [place]  08/28/2020   Veronica ISHII Sep 28, 1950 [id number]  Subjective: Successful telephone outreach call to patient. HIPAA identifiers obtained. Patient reports that she has had a difficult time recovering from her extensive oral surgery. She followed up with her PCP on 08/26/20 to discuss having diarrhea and fatigue. PCP ordered follow-up labs, antidiarrhea medication, and gave the patient a B-12 injection. Patient states that her diarrhea has improved and she is feeling some better. Patient explains that she has lost 11 pounds thus far due to her limitation of what she can eat. She is drinking Boost, Ensure and states she is drinking several bottles of water daily. Nurse discussed with patient strategies to maintain her nutrition and hydration. Currently, the patient states her COPD is at baseline and under control. Patient did not have any further questions or concerns today and did confirm that she has this nurse's contact number to call her if needed.   Encounter Medications:  Outpatient Encounter Medications as of 08/28/2020  Medication Sig Note   acetaminophen-codeine (TYLENOL #3) 300-30 MG tablet Take 1 tablet by mouth every 4 (four) hours as needed.    albuterol (PROVENTIL) (2.5 MG/3ML) 0.083% nebulizer solution USE 1 VIAL IN NEBULIZER 2 TIMES DAILY. Generic: VENTOLIN    albuterol (VENTOLIN HFA) 108 (90 Base) MCG/ACT inhaler INHALE 1 TO 2 PUFFS INTO THE LUNGS EVERY 4 HOURS AS NEEDED FOR WHEEZING OR SHORTNESS OF BREATH    aspirin 81 MG chewable tablet Chew by mouth daily.    atorvastatin (LIPITOR) 40 MG tablet TAKE 1 TABLET(40 MG) BY MOUTH DAILY    B-D INSULIN SYRINGE 1CC/25GX1" 25G X 1" 1 ML MISC USE AS DIRECTED EVERY 2 WEEKS    budesonide (PULMICORT) 0.25 MG/2ML nebulizer solution USE 1 VIAL IN NEBULIZER 2 TIMES DAILY. Generic: PULMICORT    calcium carbonate (OSCAL) 1500 (600 Ca) MG TABS tablet Take 600 mg of elemental calcium  by mouth 2 (two) times daily with a meal.     cariprazine (VRAYLAR) capsule Take 1 capsule (1.5 mg total) by mouth daily. Lot # [id number] EXP 0701/22    chlorhexidine (PERIDEX) 0.12 % solution Use to wash mouth twice a day    Cholecalciferol (VITAMIN D3) 50 MCG (2000 UT) capsule Take 1 capsule (2,000 Units total) by mouth daily.    clonazePAM (KLONOPIN) 1 MG tablet TAKE 2 TABLETS BY MOUTH EVERY NIGHT AT BEDTIME AND 1/2 TABLET EXTRA IN DAYTIME AS NEEDED    clotrimazole (LOTRIMIN) 1 % cream Apply 1 application topically. Apply to the corner of mouth twice a day    cyanocobalamin (,VITAMIN B-12,) 1000 MCG/ML injection INJECT 1ML IN THE MUSCLE EVERY 14 DAYS.    diphenoxylate-atropine (LOMOTIL) 2.5-0.025 MG tablet Take 1-2 tablets by mouth 4 (four) times daily as needed for diarrhea or loose stools.    Docusate Calcium (STOOL SOFTENER PO) Take 100 mg by mouth as needed (for constipation).  07/10/2019: Last used 2 weeks ago.   escitalopram (LEXAPRO) 20 MG tablet Take 1 tablet (20 mg total) by mouth daily.    formoterol (PERFOROMIST) 20 MCG/2ML nebulizer solution Take 2 mLs (20 mcg total) by nebulization 2 (two) times daily.    furosemide (LASIX) 20 MG tablet TAKE 1 TABLET(20 MG) BY MOUTH DAILY AS NEEDED (Patient taking differently: Take 20 mg by mouth daily.) 04/16/2020: Patient takes daily   HYDROcodone-acetaminophen (NORCO/VICODIN) 5-325 MG tablet Take 1 tablet by mouth every 6 (six) hours as needed. Take 1 by mouth  every 6 hours as needed    ketoconazole (NIZORAL) 2 % cream Apply 1 application topically 2 (two) times daily. On rash    levothyroxine (SYNTHROID) 88 MCG tablet TAKE 1 TABLET(88 MCG) BY MOUTH DAILY    nitroGLYCERIN (NITROSTAT) 0.4 MG SL tablet TAKE UNDER THE TONGUE AS DIRECTED    Omega-3 Fatty Acids (FISH OIL) 1000 MG CAPS Take 1 capsule by mouth daily.    phenytoin (DILANTIN) 100 MG ER capsule TAKE 2 CAPSULES BY MOUTH EVERY MORNING AND 1 CAPSULE EVERY EVENING    PREVIDENT 5000 BOOSTER PLUS 1.1  % PSTE BRUSH BY MOUTH TWICE DAILY    promethazine (PHENERGAN) 25 MG tablet Take 1 tablet (25 mg total) by mouth every 8 (eight) hours as needed for nausea or vomiting.    Promethazine-Codeine 6.25-10 MG/5ML SOLN Take 5 mLs by mouth every 6 (six) hours as needed. Do not take with Tylenol 3    Respiratory Therapy Supplies (FLUTTER) DEVI Use after nebulization treatments    sodium chloride (MURO 128) 5 % ophthalmic solution Place 1 drop into both eyes as needed for irritation.  07/10/2019: She is using daily.   SYRINGE-NEEDLE, DISP, 3 ML (BD ECLIPSE SYRINGE) 25G X 1" 3 ML MISC Use sq q 2 wks    traZODone (DESYREL) 50 MG tablet TAKE 4 TABLETS(200 MG) BY MOUTH AT BEDTIME    verapamil (CALAN-SR) 240 MG CR tablet TAKE 1 TABLET(240 MG) BY MOUTH AT BEDTIME    clotrimazole (MYCELEX) 10 MG troche Tale 1 by mouth five times a day (Patient not taking: Reported on 08/28/2020)    No facility-administered encounter medications on file as of 08/28/2020.    Functional Status:  In your present state of health, do you have any difficulty performing the following activities: 03/10/2020  Hearing? N  Vision? N  Difficulty concentrating or making decisions? Y  Walking or climbing stairs? N  Dressing or bathing? N  Doing errands, shopping? N  Preparing Food and eating ? N  Using the Toilet? N  In the past six months, have you accidently leaked urine? Y  Do you have problems with loss of bowel control? N  Managing your Medications? N  Managing your Finances? N  Housekeeping or managing your Housekeeping? N  Some recent data might be hidden    Fall/Depression Screening: Fall Risk  08/28/2020 07/08/2020 04/16/2020  Falls in the past year? 1 1 1   Comment - - -  Number falls in past yr: 0 0 0  Comment - - -  Injury with Fall? 1 1 0  Risk for fall due to : History of fall(s);Impaired balance/gait;Impaired mobility History of fall(s);Impaired mobility;Impaired balance/gait Impaired mobility;Impaired  balance/gait;History of fall(s)  Follow up Falls prevention discussed;Education provided;Falls evaluation completed - Falls prevention discussed;Education provided;Falls evaluation completed   PHQ 2/9 Scores 04/16/2020 03/10/2020 12/19/2019 11/22/2019 05/08/2019 02/27/2019 02/21/2018  PHQ - 2 Score 3 1 2  0 4 4 4   PHQ- 9 Score 9 - 8 0 7 8 9     Assessment:   Care Plan There are no care plans that you recently modified to display for this patient.    Goals Addressed             This Visit's Progress    [facility]) Patient will verbalize continuation of eating healthy for the next 90 days       Timeframe:  Long-Range Goal Priority:  Medium Start Date: 12/19/19  Expected End Date: 01/11/21                     Follow Up Date 12/12/20   - set goal weight - manage portion size - prepare main meal at home 3 to 5 days each week - read food labels for fat, fiber, carbohydrates and portion size - set a realistic goal   - drink at least 4 glasses of water daily -Discussed drinking Pedialyte/Gatorade to stay hydrated  -Discussed eating high probiotic yogurt  -Encouraged patient to eat small amounts frequently       Why is this important?   When you are ready to manage your nutrition or weight, having a plan and setting goals will help.  Taking small steps to change how you eat and exercise is a good place to start.    Notes: Patient monitors sodium content in foods and reports she is making healthier food choices. Nurse sent patient Planning Healthy Meals booklet.   Updated 04/16/20: Patient states she will try to eat 3 servings of fruits/vegetables daily, monitor her salt intake, and will drink 4 glasses of water daily. States she does want to lose weight by continuing to eat healthier, do chair exercises and be as active as tolerated.   Updated 08/27/20: Patient is limited with what she can eat presently due to extensive oral surgery. She is drinking Boost, Ensure, and  drinking as much water as possible to stay hydrated. Discussed drinking Pedialyte/Gatorade as needed.      [facility]) Patient will verbalize continuation of tracking and monitoring her COPD symptoms for the next 90 days       Timeframe:  Long-Range Goal Priority:  High Start Date:   12/19/19                          Expected End Date:  01/11/21                    Follow Up Date 12/12/20   - develop a rescue plan - eliminate symptom triggers at home - follow rescue plan if symptoms flare-up - keep follow-up appointments  -Discussed staying aware of avoiding triggers that cause exacerbations -Encouraged rest and purse lip breathing as needed  -Encouraged continuation of drinking boost, staying hydrated, and increasing her diet to soft foods as tolerated. -Discussed eating yogurt with probiotics to help with healthy limiting diarrhea -Discussed drinking Pedialyte as needed to avoid hydration  Why is this important?   Tracking your symptoms and other information about your health helps your doctor plan your care.  Write down the symptoms, the time of day, what you were doing and what medicine you are taking.  You will soon learn how to manage your symptoms.     Notes: Patient does eliminate symptom triggers at home. Nurse sent her COPD zones and action plans. Patient does contact her provider as needed when she has an COPD exacerbation. Updated: 01/29/20 Updated 04/16/20: Patient tracks her COPD symptoms daily and calls her providers when symptoms start to get out of her baseline. Patient limits her outside activity during very cold and hot days. She has eliminated triggers within her home. Patient follows her COPD regiment at home daily of taking her inhalers, nebulizers, and using flutter device.   Updated 07/08/20: Patient currently is undergoing dental issues where she is having multiple ongoing surgeries. Due to the steroids in her breathing treatments she is limited about using  them routinely  to avoid infection. She reports her COPD is at baseline currently and she is using Peridex mouthwash. Patient continues to track her COPD symptoms daily and calls her providers when symptoms start to get out of her baseline. Patient limits her outside activity during very cold and hot days. She has eliminated triggers within her home.   Updated 08/27/20: Patient reports she continues to be limited with using her respiratory medications due to her recovering from surgery. Patient explains that currently her COPD is at baseline.       [facility]) Patient will verbalize maintaining her weight during the long-term ongoing oral surgeries she will ecounter within the next 90 days       Timeframe:  Long-Range Goal Priority:  High Start Date:  07/08/20                           Expected End Date: 02/11/21 Follow Up Date: 12/12/20  -Discussed continuation of drinking Boost and Ensure with high protein for healing and calories -Encouraged continuation of drinking 4 glasses of water daily to maintain hydration -Discussed continuation of eating soft foods  -Discussed continuation of strict oral hygiene to avoid mouth infection -Discussed breathing medications have steroids which can lead to mouth infection and encouraged patient to continue to rinse her mouth and use Peridex mouthwash after taking breathing medications -Discussed weighing daily for better awareness of weight loss -Discussed drinking Pedialyte/Gatorade to stay hydrated  -Discussed eating high probiotic yogurt  -Encouraged patient to eat small amounts frequently  Notes: Patient reports losing approximately 8 pounds from continuation of dental and mouth surgeries. Nurse will send patient a scale for daily weight monitoring and will send Ensure coupons.                     Updated 08/27/20: Patient reports that she has lost 11 pounds since her oral surgery. She states that she developed severe diarrhea and has had a hard time with maintaining her  weight. Patient did go to her PCP and discussed her diarrhea and fatigue. She adds that her diarrhea has stopped and she is beginning to feel some better.       [facility]) Patient will verbalize managing her fatigue for the next 90 days       Timeframe:  Long-Range Goal Priority:  High Start Date:  12/19/19                           Expected End Date:  01/11/21                     Follow Up Date 12/12/20   - eat healthy - get at least 7 to 8 hours of sleep at night - get outdoors every day (weather permitting) - keep room cool and dark - practice relaxation or meditation daily - use devices that will help like a cane, sock-puller or reacher   -Discussed drinking Pedialyte/Gatorade to stay hydrated  -Discussed eating high probiotic yogurt  -Encouraged patient to eat small amounts frequently  Why is this important?   Feeling tired or worn out is a common symptom of COPD (chronic obstructive pulmonary disease).  Learning when you feel your best and when you need rest is important.  Managing the tiredness (fatigue) will help you be active and enjoy life.     Notes: Nurse sent patient Planning Healthy Meals booklet. Patient  does get out of the house daily (weather and breathing permitting). She does use a straight cane and plans to discuss getting a quad cane with PCP during her upcoming appointment on 02/28/20. Updated: 01/29/20   Updated 04/16/20: Patient states she does sleep well currently. She does purse lip breathing and rests when she become short of breath. Patient uses her cane and plans to ask provider for a prescription for a quad cane on her next follow up visit. Patient goes out daily weather permitting to get her ice tea at [employer]. Patient states she is going to eat healthier by increasing her fruit/vegetable intake to 3 servings per day and limit her salt intake.  Updated 08/27/20: Patient reports feeling fatigued do to recovering from her recent extensive mouth surgery, being  limited by what she is able to eat, and getting over a long period of diarrhea. Patient discussed her fatigue with her PCP on 08/26/20 . PCP ordered follow-up labs and give a B-12 injection.         Plan: RN Health Coach will call patient within the month of July and will send patient Ensure coupons. Follow-up: Patient agrees to Care Plan and Follow-up.  Emelia Loron RN, BSN [place] [phone] [email or web address]

## 2020-08-29 ENCOUNTER — Telehealth: Payer: Self-pay | Admitting: Internal Medicine

## 2020-08-29 NOTE — Telephone Encounter (Signed)
Patient is requesting a call back in regards to recent lab work. Please advise

## 2020-08-29 NOTE — Telephone Encounter (Signed)
Called pt concerning lab results. See lab report.Marland KitchenJohny [name]

## 2020-09-01 ENCOUNTER — Ambulatory Visit (INDEPENDENT_AMBULATORY_CARE_PROVIDER_SITE_OTHER): Payer: Medicare Other | Admitting: Psychology

## 2020-09-01 ENCOUNTER — Other Ambulatory Visit: Payer: Self-pay | Admitting: Internal Medicine

## 2020-09-01 DIAGNOSIS — F331 Major depressive disorder, recurrent, moderate: Secondary | ICD-10-CM

## 2020-09-11 ENCOUNTER — Ambulatory Visit: Payer: Medicare Other

## 2020-09-16 ENCOUNTER — Ambulatory Visit (INDEPENDENT_AMBULATORY_CARE_PROVIDER_SITE_OTHER): Payer: Medicare Other | Admitting: Psychology

## 2020-09-16 DIAGNOSIS — F331 Major depressive disorder, recurrent, moderate: Secondary | ICD-10-CM

## 2020-09-23 ENCOUNTER — Ambulatory Visit (INDEPENDENT_AMBULATORY_CARE_PROVIDER_SITE_OTHER): Payer: Medicare Other | Admitting: Internal Medicine

## 2020-09-23 ENCOUNTER — Encounter: Payer: Self-pay | Admitting: Internal Medicine

## 2020-09-23 ENCOUNTER — Other Ambulatory Visit: Payer: Self-pay

## 2020-09-23 ENCOUNTER — Telehealth: Payer: Self-pay | Admitting: *Deleted

## 2020-09-23 DIAGNOSIS — L509 Urticaria, unspecified: Secondary | ICD-10-CM | POA: Diagnosis not present

## 2020-09-23 DIAGNOSIS — E559 Vitamin D deficiency, unspecified: Secondary | ICD-10-CM | POA: Diagnosis not present

## 2020-09-23 DIAGNOSIS — J449 Chronic obstructive pulmonary disease, unspecified: Secondary | ICD-10-CM | POA: Diagnosis not present

## 2020-09-23 DIAGNOSIS — E539 Vitamin B deficiency, unspecified: Secondary | ICD-10-CM

## 2020-09-23 DIAGNOSIS — E875 Hyperkalemia: Secondary | ICD-10-CM | POA: Diagnosis not present

## 2020-09-23 DIAGNOSIS — E876 Hypokalemia: Secondary | ICD-10-CM

## 2020-09-23 DIAGNOSIS — R059 Cough, unspecified: Secondary | ICD-10-CM

## 2020-09-23 DIAGNOSIS — F419 Anxiety disorder, unspecified: Secondary | ICD-10-CM

## 2020-09-23 DIAGNOSIS — G40909 Epilepsy, unspecified, not intractable, without status epilepticus: Secondary | ICD-10-CM

## 2020-09-23 DIAGNOSIS — R197 Diarrhea, unspecified: Secondary | ICD-10-CM

## 2020-09-23 DIAGNOSIS — J479 Bronchiectasis, uncomplicated: Secondary | ICD-10-CM

## 2020-09-23 MED ORDER — ACETAMINOPHEN-CODEINE #3 300-30 MG PO TABS: 1.0000 | ORAL_TABLET | Freq: Four times a day (QID) | ORAL | 2 refills | Status: DC | PRN

## 2020-09-23 MED ORDER — PROMETHAZINE-CODEINE 6.25-10 MG/5ML PO SOLN: 5.0000 mL | Freq: Four times a day (QID) | ORAL | 1 refills | Status: DC | PRN

## 2020-09-23 MED ORDER — ACETAMINOPHEN-CODEINE #3 300-30 MG PO TABS
1.0000 | ORAL_TABLET | Freq: Four times a day (QID) | ORAL | 2 refills | Status: DC | PRN
Start: 2020-09-23 — End: 2020-09-23

## 2020-09-23 MED ORDER — ALBUTEROL SULFATE HFA 108 (90 BASE) MCG/ACT IN AERS: INHALATION_SPRAY | RESPIRATORY_TRACT | 3 refills | Status: DC

## 2020-09-23 NOTE — Assessment & Plan Note (Signed)
Eating 2 bananas a day to replenish potassium

## 2020-09-23 NOTE — Progress Notes (Signed)
Subjective:  Patient ID: Veronica [name], female    DOB: [birth year]  Age: 70 y.o. MRN: [id number]  CC: Follow-up (4 week f/u)   HPI Veronica [name] presents for healing post-oral surgery - wearing dentures now F/u on diarrhea, low K - better - using 2 bananas a day - better. On soft foods  F/u COPD, coughing (chronic)    Outpatient Medications Prior to Visit  Medication Sig Dispense Refill   acetaminophen-codeine (TYLENOL #3) 300-30 MG tablet Take 1 tablet by mouth every 4 (four) hours as needed. 60 tablet 2   albuterol (PROVENTIL) (2.5 MG/3ML) 0.083% nebulizer solution USE 1 VIAL IN NEBULIZER 2 TIMES DAILY. Generic: VENTOLIN 60 vial 5   albuterol (VENTOLIN HFA) 108 (90 Base) MCG/ACT inhaler INHALE 1 TO 2 PUFFS INTO THE LUNGS EVERY 4 HOURS AS NEEDED FOR WHEEZING OR SHORTNESS OF BREATH 8.5 g 3   aspirin 81 MG chewable tablet Chew by mouth daily.     atorvastatin (LIPITOR) 40 MG tablet TAKE 1 TABLET(40 MG) BY MOUTH DAILY 90 tablet 3   B-D INSULIN SYRINGE 1CC/25GX1" 25G X 1" 1 ML MISC USE AS DIRECTED EVERY 2 WEEKS 50 each 5   budesonide (PULMICORT) 0.25 MG/2ML nebulizer solution USE 1 VIAL IN NEBULIZER 2 TIMES DAILY. Generic: PULMICORT 120 mL 2   calcium carbonate (OSCAL) 1500 (600 Ca) MG TABS tablet Take 600 mg of elemental calcium by mouth 2 (two) times daily with a meal.      cariprazine (VRAYLAR) capsule Take 1 capsule (1.5 mg total) by mouth daily. Lot # [id number] EXP 0701/22 35 capsule 0   chlorhexidine (PERIDEX) 0.12 % solution Use to wash mouth twice a day     Cholecalciferol (VITAMIN D3) 50 MCG (2000 UT) capsule Take 1 capsule (2,000 Units total) by mouth daily. 100 capsule 3   clonazePAM (KLONOPIN) 1 MG tablet TAKE 2 TABLETS BY MOUTH EVERY NIGHT AT BEDTIME AND 1/2 TABLET EXTRA IN DAYTIME AS NEEDED 225 tablet 1   clotrimazole (LOTRIMIN) 1 % cream Apply 1 application topically. Apply to the corner of mouth twice a day     clotrimazole (MYCELEX) 10 MG troche Tale 1 by mouth five  times a day     cyanocobalamin (,VITAMIN B-12,) 1000 MCG/ML injection INJECT 1ML IN THE MUSCLE EVERY 14 DAYS. 10 mL 5   diphenoxylate-atropine (LOMOTIL) 2.5-0.025 MG tablet Take 1-2 tablets by mouth 4 (four) times daily as needed for diarrhea or loose stools. 60 tablet 1   Docusate Calcium (STOOL SOFTENER PO) Take 100 mg by mouth as needed (for constipation).      escitalopram (LEXAPRO) 20 MG tablet Take 1 tablet (20 mg total) by mouth daily. 90 tablet 3   formoterol (PERFOROMIST) 20 MCG/2ML nebulizer solution Take 2 mLs (20 mcg total) by nebulization 2 (two) times daily. 120 mL 3   furosemide (LASIX) 20 MG tablet TAKE 1 TABLET(20 MG) BY MOUTH DAILY AS NEEDED 90 tablet 3   HYDROcodone-acetaminophen (NORCO/VICODIN) 5-325 MG tablet Take 1 tablet by mouth every 6 (six) hours as needed. Take 1 by mouth every 6 hours as needed     levothyroxine (SYNTHROID) 88 MCG tablet TAKE 1 TABLET(88 MCG) BY MOUTH DAILY 90 tablet 3   nitroGLYCERIN (NITROSTAT) 0.4 MG SL tablet TAKE UNDER THE TONGUE AS DIRECTED 25 tablet 3   Omega-3 Fatty Acids (FISH OIL) 1000 MG CAPS Take 1 capsule by mouth daily.     phenytoin (DILANTIN) 100 MG ER capsule TAKE 2 CAPSULES BY MOUTH  EVERY MORNING AND 1 CAPSULE EVERY EVENING 270 capsule 3   PREVIDENT 5000 BOOSTER PLUS 1.1 % PSTE BRUSH BY MOUTH TWICE DAILY     Respiratory Therapy Supplies (FLUTTER) DEVI Use after nebulization treatments 1 each 0   sodium chloride (MURO 128) 5 % ophthalmic solution Place 1 drop into both eyes as needed for irritation.      SYRINGE-NEEDLE, DISP, 3 ML (BD ECLIPSE SYRINGE) 25G X 1" 3 ML MISC Use sq q 2 wks 50 each 3   traZODone (DESYREL) 50 MG tablet TAKE 4 TABLETS(200 MG) BY MOUTH AT BEDTIME 360 tablet 3   verapamil (CALAN-SR) 240 MG CR tablet TAKE 1 TABLET(240 MG) BY MOUTH AT BEDTIME 90 tablet 3   promethazine (PHENERGAN) 25 MG tablet Take 1 tablet (25 mg total) by mouth every 8 (eight) hours as needed for nausea or vomiting. (Patient not taking: Reported  on 09/23/2020) 30 tablet 0   Promethazine-Codeine 6.25-10 MG/5ML SOLN Take 5 mLs by mouth every 6 (six) hours as needed. Do not take with Tylenol 3 (Patient not taking: Reported on 09/23/2020) 300 mL 1   No facility-administered medications prior to visit.    ROS: Review of Systems  Constitutional:  Negative for activity change, appetite change, chills, fatigue and unexpected weight change.  HENT:  Negative for congestion, mouth sores and sinus pressure.   Eyes:  Negative for visual disturbance.  Respiratory:  Negative for cough and chest tightness.   Gastrointestinal:  Negative for abdominal pain, diarrhea and nausea.  Genitourinary:  Negative for difficulty urinating, frequency and vaginal pain.  Musculoskeletal:  Negative for back pain and gait problem.  Skin:  Negative for pallor and rash.  Neurological:  Negative for dizziness, tremors, weakness, numbness and headaches.  Psychiatric/Behavioral:  Negative for confusion and sleep disturbance.    Objective:  BP 124/62 (BP Location: Left Arm)   Pulse 81   Temp 98.7 F (37.1 C) (Oral)   Ht 5' (1.524 m)   Wt 142 lb 9.6 oz (64.7 kg)   SpO2 95%   BMI 27.85 kg/m   BP Readings from Last 3 Encounters:  09/23/20 124/62  08/26/20 130/66  07/08/20 (!) 148/70    Wt Readings from Last 3 Encounters:  09/23/20 142 lb 9.6 oz (64.7 kg)  08/26/20 140 lb 12.8 oz (63.9 kg)  07/08/20 151 lb 6.4 oz (68.7 kg)    Physical Exam Constitutional:      General: She is not in acute distress.    Appearance: Normal appearance. She is well-developed.  HENT:     Head: Normocephalic.     Right Ear: External ear normal.     Left Ear: External ear normal.     Nose: Nose normal.  Eyes:     General:        Right eye: No discharge.        Left eye: No discharge.     Conjunctiva/sclera: Conjunctivae normal.     Pupils: Pupils are equal, round, and reactive to light.  Neck:     Thyroid: No thyromegaly.     Vascular: No JVD.     Trachea: No tracheal  deviation.  Cardiovascular:     Rate and Rhythm: Normal rate and regular rhythm.     Heart sounds: Normal heart sounds.  Pulmonary:     Effort: No respiratory distress.     Breath sounds: No stridor. No wheezing.  Abdominal:     General: Bowel sounds are normal. There is no distension.  Palpations: Abdomen is soft. There is no mass.     Tenderness: There is no abdominal tenderness. There is no guarding or rebound.  Musculoskeletal:        General: No tenderness.     Cervical back: Normal range of motion and neck supple. No rigidity.  Lymphadenopathy:     Cervical: No cervical adenopathy.  Skin:    Findings: No erythema or rash.  Neurological:     Cranial Nerves: No cranial nerve deficit.     Motor: No abnormal muscle tone.     Coordination: Coordination abnormal.     Gait: Gait abnormal.     Deep Tendon Reflexes: Reflexes normal.  Psychiatric:        Behavior: Behavior normal.        Thought Content: Thought content normal.        Judgment: Judgment normal.  Using a cane Dentures are in  Lab Results  Component Value Date   WBC 11.8 (H) 08/26/2020   HGB 14.5 08/26/2020   HCT 42.8 08/26/2020   PLT 345.0 08/26/2020   GLUCOSE 109 (H) 08/26/2020   CHOL 249 (H) 11/10/2018   TRIG 88.0 11/10/2018   HDL 57.70 11/10/2018   LDLDIRECT 165.0 09/26/2009   LDLCALC 174 (H) 11/10/2018   ALT 45 (H) 08/26/2020   AST 52 (H) 08/26/2020   NA 137 08/26/2020   K 3.4 (L) 08/26/2020   CL 101 08/26/2020   CREATININE 0.66 08/26/2020   BUN 12 08/26/2020   CO2 24 08/26/2020   TSH 2.22 11/10/2018   INR 1.0 ratio 11/07/2009   HGBA1C 5.6 08/23/2019    MR 3D Recon At Scanner  Result Date: 02/12/2019 CLINICAL DATA:  Right upper quadrant abdominal pain. Suspected cholecystitis. EXAM: MRI ABDOMEN WITHOUT AND WITH CONTRAST (INCLUDING MRCP) TECHNIQUE: Multiplanar multisequence MR imaging of the abdomen was performed both before and after the administration of intravenous contrast. Heavily  T2-weighted images of the biliary and pancreatic ducts were obtained, and three-dimensional MRCP images were rendered by post processing. CONTRAST:  85mL GADAVIST GADOBUTROL 1 MMOL/ML IV SOLN COMPARISON:  Abdominal ultrasound 08/07/2015. Abdominal CT 08/31/2010. FINDINGS: Despite efforts by the technologist and patient, mild motion artifact is present on today's exam and could not be eliminated. This reduces exam sensitivity and specificity. Lower chest: There are no significant findings within the visualized lower chest. Hepatobiliary: Small hepatic cysts are noted. There are new suspicious hepatic findings. The gallbladder is surgically absent. There is mild fusiform extrahepatic biliary dilatation with the common hepatic duct measuring up to 8 mm in diameter. The duct tapers distally and appears similar to previous CT. No evidence of choledocholithiasis or intrahepatic biliary dilatation. Pancreas: Unremarkable. No pancreatic ductal dilatation or surrounding inflammatory changes. Stable prominent fat between the pancreatic head and the superior mesenteric vein. Spleen: Normal in size without focal abnormality. Adrenals/Urinary Tract: Both adrenal glands appear normal. Small right renal cyst. No evidence of renal mass or hydronephrosis. Stomach/Bowel: No evidence of bowel wall thickening, distention or surrounding inflammatory change. Vascular/Lymphatic: There are no enlarged abdominal lymph nodes. Aortic and branch vessel atherosclerosis. No acute vascular findings identified. Other: The visualized anterior abdominal wall appears intact. No ascites. Musculoskeletal: No acute or significant osseous findings. Facet hypertrophy and associated synovial enhancement noted within the mid lumbar spine. IMPRESSION: 1. No acute findings or explanation for the patient's symptoms. 2. Mild fusiform extrahepatic biliary dilatation status post cholecystectomy, similar to previous CT. No evidence of choledocholithiasis or  intrahepatic biliary dilatation. 3.  Aortic Atherosclerosis (  ICD10-I70.0). Electronically Signed   By: Richardean Sale M.D.   On: 02/12/2019 13:07   MR ABD/MRCP  Result Date: 02/12/2019 CLINICAL DATA:  Right upper quadrant abdominal pain. Suspected cholecystitis. EXAM: MRI ABDOMEN WITHOUT AND WITH CONTRAST (INCLUDING MRCP) TECHNIQUE: Multiplanar multisequence MR imaging of the abdomen was performed both before and after the administration of intravenous contrast. Heavily T2-weighted images of the biliary and pancreatic ducts were obtained, and three-dimensional MRCP images were rendered by post processing. CONTRAST:  29mL GADAVIST GADOBUTROL 1 MMOL/ML IV SOLN COMPARISON:  Abdominal ultrasound 08/07/2015. Abdominal CT 08/31/2010. FINDINGS: Despite efforts by the technologist and patient, mild motion artifact is present on today's exam and could not be eliminated. This reduces exam sensitivity and specificity. Lower chest: There are no significant findings within the visualized lower chest. Hepatobiliary: Small hepatic cysts are noted. There are new suspicious hepatic findings. The gallbladder is surgically absent. There is mild fusiform extrahepatic biliary dilatation with the common hepatic duct measuring up to 8 mm in diameter. The duct tapers distally and appears similar to previous CT. No evidence of choledocholithiasis or intrahepatic biliary dilatation. Pancreas: Unremarkable. No pancreatic ductal dilatation or surrounding inflammatory changes. Stable prominent fat between the pancreatic head and the superior mesenteric vein. Spleen: Normal in size without focal abnormality. Adrenals/Urinary Tract: Both adrenal glands appear normal. Small right renal cyst. No evidence of renal mass or hydronephrosis. Stomach/Bowel: No evidence of bowel wall thickening, distention or surrounding inflammatory change. Vascular/Lymphatic: There are no enlarged abdominal lymph nodes. Aortic and branch vessel atherosclerosis. No  acute vascular findings identified. Other: The visualized anterior abdominal wall appears intact. No ascites. Musculoskeletal: No acute or significant osseous findings. Facet hypertrophy and associated synovial enhancement noted within the mid lumbar spine. IMPRESSION: 1. No acute findings or explanation for the patient's symptoms. 2. Mild fusiform extrahepatic biliary dilatation status post cholecystectomy, similar to previous CT. No evidence of choledocholithiasis or intrahepatic biliary dilatation. 3.  Aortic Atherosclerosis (ICD10-I70.0). Electronically Signed   By: Richardean Sale M.D.   On: 02/12/2019 13:07    Assessment & Plan:    Walker Kehr, MD

## 2020-09-23 NOTE — Assessment & Plan Note (Signed)
Use Proair MDI prn

## 2020-09-23 NOTE — Assessment & Plan Note (Signed)
Situational, chronic Clonazepam prn  Potential benefits of a long term benzodiazepines  use as well as potential risks  and complications were explained to the patient and were aknowledged. 1/19 Vraylar via assist program

## 2020-09-23 NOTE — Telephone Encounter (Signed)
Pt is here for appt she states " Need Vralyer order". Will call [facility] Pt assistance [phone] to order pt Vralyer.Marland KitchenJohny Chess

## 2020-09-23 NOTE — Assessment & Plan Note (Signed)
Resolved

## 2020-09-23 NOTE — Assessment & Plan Note (Signed)
Chronic Prom-cod syr prn Potential benefits of a long term opioids use as well as potential risks (i.e. addiction risk, apnea etc) and complications (i.e. Somnolence, constipation and others) were explained to the patient and were aknowledged.

## 2020-09-23 NOTE — Assessment & Plan Note (Signed)
No seizures  - on Dilantin

## 2020-09-26 ENCOUNTER — Other Ambulatory Visit: Payer: Self-pay | Admitting: *Deleted

## 2020-09-26 NOTE — Telephone Encounter (Signed)
Called [facility] spoke w/ rep Lorriane Shire  placed order for Vraylar w/shipping ID [id number]. Will take 7-10 days to deliver to office.Marland KitchenJohny Chess

## 2020-09-26 NOTE — Patient Instructions (Addendum)
Goals Addressed             This Visit's Progress    [facility]) Patient will verbalize continuation of tracking and monitoring her COPD symptoms for the next 90 days   On track    Timeframe:  Long-Range Goal Priority:  High Start Date:   12/19/19                          Expected End Date:  01/11/21                    Follow Up Date 12/12/20   - develop a rescue plan - eliminate symptom triggers at home - follow rescue plan if symptoms flare-up - keep follow-up appointments  -Discussed staying aware of avoiding triggers that cause exacerbations -Encouraged rest and purse lip breathing as needed  -Encouraged continuation of drinking boost, staying hydrated, and increasing her diet to soft foods as tolerated. -Discussed eating yogurt with probiotics to help with healthy limiting diarrhea -Discussed drinking Pedialyte as needed to avoid hydration  Why is this important?   Tracking your symptoms and other information about your health helps your doctor plan your care.  Write down the symptoms, the time of day, what you were doing and what medicine you are taking.  You will soon learn how to manage your symptoms.     Notes: Patient does eliminate symptom triggers at home. Nurse sent her COPD zones and action plans. Patient does contact her provider as needed when she has an COPD exacerbation. Updated: 01/29/20 Updated 04/16/20: Patient tracks her COPD symptoms daily and calls her providers when symptoms start to get out of her baseline. Patient limits her outside activity during very cold and hot days. She has eliminated triggers within her home. Patient follows her COPD regiment at home daily of taking her inhalers, nebulizers, and using flutter device.   Updated 07/08/20: Patient currently is undergoing dental issues where she is having multiple ongoing surgeries. Due to the steroids in her breathing treatments she is limited about using them routinely to avoid infection. She reports her COPD  is at baseline currently and she is using Peridex mouthwash. Patient continues to track her COPD symptoms daily and calls her providers when symptoms start to get out of her baseline. Patient limits her outside activity during very cold and hot days. She has eliminated triggers within her home.   Updated 08/27/20: Patient reports she continues to be limited with using her respiratory medications due to her recovering from surgery. Patient explains that currently her COPD is at baseline.  Updated 09/26/20: Patient states that her COPD is at baseline. She cannot hold the flutter device in her mouth due to her surgery but is now allowed to use her rescue inhaler and pulmicort nebulizer.      [facility]) Patient will verbalize maintaining her weight during the long-term ongoing oral surgeries she will ecounter within the next 90 days   On track    Timeframe:  Long-Range Goal Priority:  High Start Date:  07/08/20                           Expected End Date: 02/11/21 Follow Up Date: 12/12/20  -Discussed continuation of drinking Boost and Ensure with high protein for healing and calories -Encouraged continuation of drinking 4 glasses of water daily to maintain hydration -Discussed continuation of eating soft foods  -Discussed continuation of  strict oral hygiene to avoid mouth infection -Discussed breathing medications have steroids which can lead to mouth infection and encouraged patient to continue to rinse her mouth and use Peridex mouthwash after taking breathing medications -Discussed weighing daily for better awareness of weight loss -Discussed drinking Pedialyte/Gatorade to stay hydrated  -Discussed eating high probiotic yogurt  -Encouraged patient to eat small amounts frequently  Notes: Patient reports losing approximately 8 pounds from continuation of dental and mouth surgeries. Nurse will send patient a scale for daily weight monitoring and will send Ensure coupons.                     Updated  08/27/20: Patient reports that she has lost 11 pounds since her oral surgery. She states that she developed severe diarrhea and has had a hard time with maintaining her weight. Patient did go to her PCP and discussed her diarrhea and fatigue. She adds that her diarrhea has stopped and she is beginning to feel some better.   Updated 09/26/20: Patient reports that her mouth pain has improved. She now has her dentures but will need to go back and have the bottom plate adjusted as well as go to the oral surgeon to have more implants placed. She is now eating soft foods and continues to supplement with Boost and Ensure. Patient states she is staying hydrated by drinking a lot of water and will go get some Gatorade to drink as needed. Patient states she has lost a few more pounds but feels she will be able to control this now that she is able to eat more.     [facility]) Patient will verbalize managing her fatigue for the next 90 days   On track    Timeframe:  Long-Range Goal Priority:  High Start Date:  12/19/19                           Expected End Date:  01/11/21                     Follow Up Date 12/12/20   - eat healthy - get at least 7 to 8 hours of sleep at night - get outdoors every day (weather permitting) - keep room cool and dark - practice relaxation or meditation daily - use devices that will help like a cane, sock-puller or reacher   -Discussed drinking Pedialyte/Gatorade to stay hydrated  -Discussed eating high probiotic yogurt  -Encouraged patient to eat small amounts frequently  Why is this important?   Feeling tired or worn out is a common symptom of COPD (chronic obstructive pulmonary disease).  Learning when you feel your best and when you need rest is important.  Managing the tiredness (fatigue) will help you be active and enjoy life.     Notes: Nurse sent patient Planning Healthy Meals booklet. Patient does get out of the house daily (weather and breathing permitting). She does use  a straight cane and plans to discuss getting a quad cane with PCP during her upcoming appointment on 02/28/20. Updated: 01/29/20   Updated 04/16/20: Patient states she does sleep well currently. She does purse lip breathing and rests when she become short of breath. Patient uses her cane and plans to ask provider for a prescription for a quad cane on her next follow up visit. Patient goes out daily weather permitting to get her ice tea at [employer]. Patient states she is going to eat  healthier by increasing her fruit/vegetable intake to 3 servings per day and limit her salt intake.  Updated 08/27/20: Patient reports feeling fatigued do to recovering from her recent extensive mouth surgery, being limited by what she is able to eat, and getting over a long period of diarrhea. Patient discussed her fatigue with her PCP on 08/26/20 . PCP ordered follow-up labs and give a B-12 injection.   Updated: Patient reports feeling better with her fatigue. The diarrhea has improved and she is now able to tolerate eating soft foods to give her better energy. Patient is staying hydrated and continues to supplement with Boost and Ensure.

## 2020-09-26 NOTE — Patient Outreach (Signed)
[facility]) Care Management  [place]  09/26/2020   Veronica [name] January 16, 1951 [id number]  Subjective: Successful telephone outreach call to patient. HIPAA identifiers obtained. Patient reports that her mouth pain has improved. She now has her dentures but will need to go back and have the bottom plate adjusted as well as go to the oral surgeon to have more implants placed. She is now eating soft foods and continues to supplement with Boost and Ensure. Patient states she is staying hydrated by drinking a lot of water and will go get some Gatorade to drink as needed. Patient states she has lost a few more pounds but feels she will be able to control this now that she is able to eat more. Patient states that her COPD is at baseline. She cannot hold the flutter device in her mouth due to her surgery but is now allowed to use her rescue inhaler and pulmicort nebulizer. Patient reports feeling better with her fatigue. The diarrhea has improved and she is now able to tolerate eating soft foods to give her better energy. Patient did not have any further questions or concerns today and did confirm that she has this nurse's contact number to call her if needed.   Encounter Medications:  Outpatient Encounter Medications as of 09/26/2020  Medication Sig Note   acetaminophen-codeine (TYLENOL #3) 300-30 MG tablet Take 1 tablet by mouth every 6 (six) hours as needed.    albuterol (PROVENTIL) (2.5 MG/3ML) 0.083% nebulizer solution USE 1 VIAL IN NEBULIZER 2 TIMES DAILY. Generic: VENTOLIN    albuterol (VENTOLIN HFA) 108 (90 Base) MCG/ACT inhaler INHALE 2 PUFFS INTO THE LUNGS EVERY 4 HOURS AS NEEDED FOR WHEEZING OR SHORTNESS OF BREATH    aspirin 81 MG chewable tablet Chew by mouth daily.    atorvastatin (LIPITOR) 40 MG tablet TAKE 1 TABLET(40 MG) BY MOUTH DAILY    B-D INSULIN SYRINGE 1CC/25GX1" 25G X 1" 1 ML MISC USE AS DIRECTED EVERY 2 WEEKS    budesonide (PULMICORT) 0.25 MG/2ML  nebulizer solution USE 1 VIAL IN NEBULIZER 2 TIMES DAILY. Generic: PULMICORT    calcium carbonate (OSCAL) 1500 (600 Ca) MG TABS tablet Take 600 mg of elemental calcium by mouth 2 (two) times daily with a meal.     cariprazine (VRAYLAR) capsule Take 1 capsule (1.5 mg total) by mouth daily. Lot # [id number] EXP 0701/22    chlorhexidine (PERIDEX) 0.12 % solution Use to wash mouth twice a day    Cholecalciferol (VITAMIN D3) 50 MCG (2000 UT) capsule Take 1 capsule (2,000 Units total) by mouth daily.    clonazePAM (KLONOPIN) 1 MG tablet TAKE 2 TABLETS BY MOUTH EVERY NIGHT AT BEDTIME AND 1/2 TABLET EXTRA IN DAYTIME AS NEEDED    clotrimazole (LOTRIMIN) 1 % cream Apply 1 application topically. Apply to the corner of mouth twice a day    cyanocobalamin (,VITAMIN B-12,) 1000 MCG/ML injection INJECT 1ML IN THE MUSCLE EVERY 14 DAYS.    diphenoxylate-atropine (LOMOTIL) 2.5-0.025 MG tablet Take 1-2 tablets by mouth 4 (four) times daily as needed for diarrhea or loose stools.    Docusate Calcium (STOOL SOFTENER PO) Take 100 mg by mouth as needed (for constipation).  07/10/2019: Last used 2 weeks ago.   escitalopram (LEXAPRO) 20 MG tablet Take 1 tablet (20 mg total) by mouth daily.    formoterol (PERFOROMIST) 20 MCG/2ML nebulizer solution Take 2 mLs (20 mcg total) by nebulization 2 (two) times daily.    furosemide (LASIX) 20 MG  tablet TAKE 1 TABLET(20 MG) BY MOUTH DAILY AS NEEDED    levothyroxine (SYNTHROID) 88 MCG tablet TAKE 1 TABLET(88 MCG) BY MOUTH DAILY    nitroGLYCERIN (NITROSTAT) 0.4 MG SL tablet TAKE UNDER THE TONGUE AS DIRECTED    Omega-3 Fatty Acids (FISH OIL) 1000 MG CAPS Take 1 capsule by mouth daily.    phenytoin (DILANTIN) 100 MG ER capsule TAKE 2 CAPSULES BY MOUTH EVERY MORNING AND 1 CAPSULE EVERY EVENING    PREVIDENT 5000 BOOSTER PLUS 1.1 % PSTE BRUSH BY MOUTH TWICE DAILY    Promethazine-Codeine 6.25-10 MG/5ML SOLN Take 5 mLs by mouth every 6 (six) hours as needed. Do not take with Tylenol 3     Respiratory Therapy Supplies (FLUTTER) DEVI Use after nebulization treatments    sodium chloride (MURO 128) 5 % ophthalmic solution Place 1 drop into both eyes as needed for irritation.  07/10/2019: She is using daily.   SYRINGE-NEEDLE, DISP, 3 ML (BD ECLIPSE SYRINGE) 25G X 1" 3 ML MISC Use sq q 2 wks    traZODone (DESYREL) 50 MG tablet TAKE 4 TABLETS(200 MG) BY MOUTH AT BEDTIME    verapamil (CALAN-SR) 240 MG CR tablet TAKE 1 TABLET(240 MG) BY MOUTH AT BEDTIME    clotrimazole (MYCELEX) 10 MG troche Tale 1 by mouth five times a day (Patient not taking: Reported on 09/26/2020) 09/26/2020: completed   No facility-administered encounter medications on file as of 09/26/2020.    Functional Status:  In your present state of health, do you have any difficulty performing the following activities: 03/10/2020  Hearing? N  Vision? N  Difficulty concentrating or making decisions? Y  Walking or climbing stairs? N  Dressing or bathing? N  Doing errands, shopping? N  Preparing Food and eating ? N  Using the Toilet? N  In the past six months, have you accidently leaked urine? Y  Do you have problems with loss of bowel control? N  Managing your Medications? N  Managing your Finances? N  Housekeeping or managing your Housekeeping? N  Some recent data might be hidden    Fall/Depression Screening: Fall Risk  08/28/2020 07/08/2020 04/16/2020  Falls in the past year? 1 1 1   Comment - - -  Number falls in past yr: 0 0 0  Comment - - -  Injury with Fall? 1 1 0  Risk for fall due to : History of fall(s);Impaired balance/gait;Impaired mobility History of fall(s);Impaired mobility;Impaired balance/gait Impaired mobility;Impaired balance/gait;History of fall(s)  Follow up Falls prevention discussed;Education provided;Falls evaluation completed - Falls prevention discussed;Education provided;Falls evaluation completed   PHQ 2/9 Scores 04/16/2020 03/10/2020 12/19/2019 11/22/2019 05/08/2019 02/27/2019 02/21/2018  PHQ - 2  Score 3 1 2  0 4 4 4   PHQ- 9 Score 9 - 8 0 7 8 9     Assessment:   Care Plan There are no care plans that you recently modified to display for this patient.    Goals Addressed             This Visit's Progress    [facility]) Patient will verbalize continuation of tracking and monitoring her COPD symptoms for the next 90 days   On track    Timeframe:  Long-Range Goal Priority:  High Start Date:   12/19/19                          Expected End Date:  01/11/21  Follow Up Date 12/12/20   - develop a rescue plan - eliminate symptom triggers at home - follow rescue plan if symptoms flare-up - keep follow-up appointments  -Discussed staying aware of avoiding triggers that cause exacerbations -Encouraged rest and purse lip breathing as needed  -Encouraged continuation of drinking boost, staying hydrated, and increasing her diet to soft foods as tolerated. -Discussed eating yogurt with probiotics to help with healthy limiting diarrhea -Discussed drinking Pedialyte as needed to avoid hydration  Why is this important?   Tracking your symptoms and other information about your health helps your doctor plan your care.  Write down the symptoms, the time of day, what you were doing and what medicine you are taking.  You will soon learn how to manage your symptoms.     Notes: Patient does eliminate symptom triggers at home. Nurse sent her COPD zones and action plans. Patient does contact her provider as needed when she has an COPD exacerbation. Updated: 01/29/20 Updated 04/16/20: Patient tracks her COPD symptoms daily and calls her providers when symptoms start to get out of her baseline. Patient limits her outside activity during very cold and hot days. She has eliminated triggers within her home. Patient follows her COPD regiment at home daily of taking her inhalers, nebulizers, and using flutter device.   Updated 07/08/20: Patient currently is undergoing dental issues where she is  having multiple ongoing surgeries. Due to the steroids in her breathing treatments she is limited about using them routinely to avoid infection. She reports her COPD is at baseline currently and she is using Peridex mouthwash. Patient continues to track her COPD symptoms daily and calls her providers when symptoms start to get out of her baseline. Patient limits her outside activity during very cold and hot days. She has eliminated triggers within her home.   Updated 08/27/20: Patient reports she continues to be limited with using her respiratory medications due to her recovering from surgery. Patient explains that currently her COPD is at baseline.  Updated 09/26/20: Patient states that her COPD is at baseline. She cannot hold the flutter device in her mouth due to her surgery but is now allowed to use her rescue inhaler and pulmicort nebulizer.      [facility]) Patient will verbalize maintaining her weight during the long-term ongoing oral surgeries she will ecounter within the next 90 days   On track    Timeframe:  Long-Range Goal Priority:  High Start Date:  07/08/20                           Expected End Date: 02/11/21 Follow Up Date: 12/12/20  -Discussed continuation of drinking Boost and Ensure with high protein for healing and calories -Encouraged continuation of drinking 4 glasses of water daily to maintain hydration -Discussed continuation of eating soft foods  -Discussed continuation of strict oral hygiene to avoid mouth infection -Discussed breathing medications have steroids which can lead to mouth infection and encouraged patient to continue to rinse her mouth and use Peridex mouthwash after taking breathing medications -Discussed weighing daily for better awareness of weight loss -Discussed drinking Pedialyte/Gatorade to stay hydrated  -Discussed eating high probiotic yogurt  -Encouraged patient to eat small amounts frequently  Notes: Patient reports losing approximately 8 pounds from  continuation of dental and mouth surgeries. Nurse will send patient a scale for daily weight monitoring and will send Ensure coupons.  Updated 08/27/20: Patient reports that she has lost 11 pounds since her oral surgery. She states that she developed severe diarrhea and has had a hard time with maintaining her weight. Patient did go to her PCP and discussed her diarrhea and fatigue. She adds that her diarrhea has stopped and she is beginning to feel some better.   Updated 09/26/20: Patient reports that her mouth pain has improved. She now has her dentures but will need to go back and have the bottom plate adjusted as well as go to the oral surgeon to have more implants placed. She is now eating soft foods and continues to supplement with Boost and Ensure. Patient states she is staying hydrated by drinking a lot of water and will go get some Gatorade to drink as needed. Patient states she has lost a few more pounds but feels she will be able to control this now that she is able to eat more.     [facility]) Patient will verbalize managing her fatigue for the next 90 days   On track    Timeframe:  Long-Range Goal Priority:  High Start Date:  12/19/19                           Expected End Date:  01/11/21                     Follow Up Date 12/12/20   - eat healthy - get at least 7 to 8 hours of sleep at night - get outdoors every day (weather permitting) - keep room cool and dark - practice relaxation or meditation daily - use devices that will help like a cane, sock-puller or reacher   -Discussed drinking Pedialyte/Gatorade to stay hydrated  -Discussed eating high probiotic yogurt  -Encouraged patient to eat small amounts frequently  Why is this important?   Feeling tired or worn out is a common symptom of COPD (chronic obstructive pulmonary disease).  Learning when you feel your best and when you need rest is important.  Managing the tiredness (fatigue) will help you be active and  enjoy life.     Notes: Nurse sent patient Planning Healthy Meals booklet. Patient does get out of the house daily (weather and breathing permitting). She does use a straight cane and plans to discuss getting a quad cane with PCP during her upcoming appointment on 02/28/20. Updated: 01/29/20   Updated 04/16/20: Patient states she does sleep well currently. She does purse lip breathing and rests when she become short of breath. Patient uses her cane and plans to ask provider for a prescription for a quad cane on her next follow up visit. Patient goes out daily weather permitting to get her ice tea at [employer]. Patient states she is going to eat healthier by increasing her fruit/vegetable intake to 3 servings per day and limit her salt intake.  Updated 08/27/20: Patient reports feeling fatigued do to recovering from her recent extensive mouth surgery, being limited by what she is able to eat, and getting over a long period of diarrhea. Patient discussed her fatigue with her PCP on 08/26/20 . PCP ordered follow-up labs and give a B-12 injection.   Updated: Patient reports feeling better with her fatigue. The diarrhea has improved and she is now able to tolerate eating soft foods to give her better energy. Patient is staying hydrated and continues to supplement with Boost and Ensure.  Plan: RN Health Coach will send patient Ensure coupons and will  call patient within the month of September. Follow-up: Patient agrees to Care Plan and Follow-up.  Emelia Loron RN, BSN [place] [phone] [email or web address]

## 2020-09-29 ENCOUNTER — Ambulatory Visit (INDEPENDENT_AMBULATORY_CARE_PROVIDER_SITE_OTHER): Payer: Medicare Other | Admitting: Psychology

## 2020-09-29 DIAGNOSIS — F331 Major depressive disorder, recurrent, moderate: Secondary | ICD-10-CM | POA: Diagnosis not present

## 2020-10-08 ENCOUNTER — Telehealth: Payer: Self-pay | Admitting: *Deleted

## 2020-10-08 NOTE — Telephone Encounter (Signed)
Notified pt we received her Vraylar from the patient assistance program. Warehouse order for [facility] # [id number]. Inform her will leave at the front desk foe her to pick-up.Marland KitchenJohny Chess

## 2020-10-13 ENCOUNTER — Ambulatory Visit (INDEPENDENT_AMBULATORY_CARE_PROVIDER_SITE_OTHER): Payer: Medicare Other | Admitting: Psychology

## 2020-10-13 DIAGNOSIS — F331 Major depressive disorder, recurrent, moderate: Secondary | ICD-10-CM | POA: Diagnosis not present

## 2020-10-27 ENCOUNTER — Ambulatory Visit (INDEPENDENT_AMBULATORY_CARE_PROVIDER_SITE_OTHER): Payer: Medicare Other | Admitting: Psychology

## 2020-10-27 DIAGNOSIS — F331 Major depressive disorder, recurrent, moderate: Secondary | ICD-10-CM | POA: Diagnosis not present

## 2020-10-30 ENCOUNTER — Other Ambulatory Visit: Payer: Self-pay | Admitting: Internal Medicine

## 2020-11-10 ENCOUNTER — Ambulatory Visit (INDEPENDENT_AMBULATORY_CARE_PROVIDER_SITE_OTHER): Payer: Medicare Other | Admitting: Psychology

## 2020-11-10 DIAGNOSIS — F331 Major depressive disorder, recurrent, moderate: Secondary | ICD-10-CM

## 2020-11-24 ENCOUNTER — Other Ambulatory Visit: Payer: Self-pay | Admitting: Internal Medicine

## 2020-11-24 ENCOUNTER — Ambulatory Visit (INDEPENDENT_AMBULATORY_CARE_PROVIDER_SITE_OTHER): Payer: Medicare Other | Admitting: Psychology

## 2020-11-24 DIAGNOSIS — F331 Major depressive disorder, recurrent, moderate: Secondary | ICD-10-CM | POA: Diagnosis not present

## 2020-11-27 ENCOUNTER — Ambulatory Visit: Payer: Medicare Other | Admitting: Pulmonary Disease

## 2020-11-27 ENCOUNTER — Encounter: Payer: Self-pay | Admitting: Family Medicine

## 2020-11-27 ENCOUNTER — Other Ambulatory Visit: Payer: Self-pay

## 2020-11-27 ENCOUNTER — Telehealth (INDEPENDENT_AMBULATORY_CARE_PROVIDER_SITE_OTHER): Payer: Medicare Other | Admitting: Family Medicine

## 2020-11-27 VITALS — BP 140/82 | Temp 100.9°F

## 2020-11-27 DIAGNOSIS — R059 Cough, unspecified: Secondary | ICD-10-CM | POA: Diagnosis not present

## 2020-11-27 DIAGNOSIS — R0600 Dyspnea, unspecified: Secondary | ICD-10-CM | POA: Diagnosis not present

## 2020-11-27 NOTE — Progress Notes (Signed)
Virtual Visit via Telephone Note  I connected with Veronica [name] on 11/27/20 at 11:00 AM EDT by telephone and verified that I am speaking with the correct person using two identifiers.   I discussed the limitations, risks, security and privacy concerns of performing an evaluation and management service by telephone and the availability of in person appointments. I also discussed with the patient that there may be a patient responsible charge related to this service. The patient expressed understanding and agreed to proceed.  Location patient: home, [place] Location provider: work or home office Participants present for the call: patient, provider Patient did not have a visit with me in the prior 7 days to address this/these issue(s).   History of Present Illness:  Acute telemedicine visit for cough and congestion: -Onset: one week ago she reports with "allergies flaring up" -Symptoms include: fevers, nasal congestion, cough, but then went into "my bronchitis again", reports had a bad episode like this last year,she has been using her albuterol when has some SOB - which helps some but still has CP/SOB when up and around, coughing up a lot of thick mucus -has not had flu or covid test -Denies: fevers, NVD, inability to eat/drink/get out of bed, known sick contact -Pertinent past medical history: see below -Pertinent medication allergies:  Allergies  Allergen Reactions   Avelox [Moxifloxacin Hcl In Nacl] Anaphylaxis, Swelling and Rash   Cephalexin Shortness Of Breath and Itching   Clindamycin Hcl Anaphylaxis    severe allergic reaction   Moxifloxacin Anaphylaxis, Itching, Swelling and Rash    Avelox   Amantadine Hcl Other (See Comments)    Rash and shortness of breath   Bee Venom Itching and Swelling    Localized   Cymbalta [Duloxetine Hcl] Nausea Only    Dizzy   Cyproheptadine Hcl Other (See Comments)    Unknown   Doxycycline Hyclate Other (See Comments)    High blood pressure    Effexor [Venlafaxine Hydrochloride] Other (See Comments)    elev BP (sky high), dizzy, shaking, ER visit   Effexor [Venlafaxine]     Restless    Fluticasone-Salmeterol Itching   Guaifenesin Other (See Comments)    Unknown   Imipramine Hcl Other (See Comments)    Unknown    Lactose Intolerance (Gi) Other (See Comments)    Per allergy testing   Latex Itching    dermatitis   Other Other (See Comments)    SULFONAMIDES = [Rash]   Oxycodone-Acetaminophen Itching   Phenytoin Other (See Comments)    Pt must DILANTIN "brand name" only    Pirbuterol Acetate Other (See Comments)    Maxair, Unknown   Remeron [Mirtazapine]     nightmares   Salmeterol Xinafoate Other (See Comments)    Shaking, Serevent   Viibryd [Vilazodone Hcl] Itching and Nausea Only    shaky   Zolpidem Tartrate Other (See Comments)    REACTION: not effective   Azithromycin Itching and Rash   Ciprofloxacin Itching and Rash   Contrast Media  [Iodinated Diagnostic Agents] Rash    Other reaction(s): Respiratory Distress (ALLERGY/intolerance)   Erythromycin Base Itching and Rash   Flagyl [Metronidazole Hcl] Itching and Rash   Fluconazole Itching and Rash   Fluoxetine Rash   Lansoprazole Itching and Rash   Metoclopramide Hcl Itching and Rash   Montelukast Sodium Itching and Rash   Penicillins Itching and Rash    All cillins, Has patient had a PCN reaction causing immediate rash, facial/tongue/throat swelling, SOB or lightheadedness with  hypotension: Yes Has patient had a PCN reaction causing severe rash involving mucus membranes or skin necrosis: No Has patient had a PCN reaction that required hospitalization No Has patient had a PCN reaction occurring within the last 10 years: Yes If all of the above answers are "NO", then may proceed with Cephalosporin use.    Propulsid [Cisapride] Itching and Rash   Reglan [Metoclopramide] Itching and Rash   Sulfadiazine Itching and Rash   Sulfamethoxazole-Trimethoprim Itching  and Rash   Telithromycin Itching and Rash   Tetracycline Hcl Itching and Rash   Topiramate Itching and Rash   Valproic Acid Rash  -COVID-19 vaccine status: vaccinated and boosted  Past Medical History:  Diagnosis Date   Adenomatous colon polyp    Asthma    AVM (arteriovenous malformation)    Bronchitis, chronic (HCC)    CAD (coronary artery disease)    Colon polyp 01/04/91   hyperplastic   COPD (chronic obstructive pulmonary disease) (HCC)    CVA (cerebral infarction)    following brain surgery   Depression    Diverticulosis of colon 02/17/06   Endometriosis    Gastric ulcer    GERD (gastroesophageal reflux disease)    History of colonic polyps    Hyperlipidemia    Hypertension    LBP (low back pain)    Migraine headache    OA (osteoarthritis)    PONV (postoperative nausea and vomiting)    slight nausea   Seizure disorder (HCC)    Seizures (HCC)    Shortness of breath dyspnea    Sjogren's disease ([place]) 2010   per Dr. Owens Shark, DDS   Stroke [facility])    right side weakness   Thyroid nodule    Vitamin B 12 deficiency    Vitamin D deficiency      Observations/Objective: Patient sounds cheerful and well on the phone. I do not appreciate any SOB. Speech and thought processing are grossly intact. Patient reported vitals:  Assessment and Plan:  Dyspnea, unspecified type  Cough  -we discussed possible serious and likely etiologies, options for evaluation and workup, limitations of telemedicine visit vs in person visit, treatment, treatment risks and precautions. Pt prefers to treat via telemedicine empirically rather than in person at this moment. Query influenza, [id number], COPD exacerbation vs other. She feels she needs and abx, but given her symptoms and her lengthy hx of severe abx allergies advised inperson evaluation at a higher level of care to test for viral etiologies, exam resp status, possible CXR, etc and may need abx under supervision. She agrees and agrees to seek  inperson care at an [facility] or medcenter near her location today. She feels comfortable with going via private vehicle.    Follow Up Instructions:  I did not refer this patient for an OV with me in the next 24 hours for this/these issue(s).  I discussed the assessment and treatment plan with the patient. The patient was provided an opportunity to ask questions and all were answered. The patient agreed with the plan and demonstrated an understanding of the instructions.   I spent 13 minutes on the date of this visit in the care of this patient. See summary of tasks completed to properly care for this patient in the detailed notes above which also included counseling of above, review of PMH, medications, allergies, evaluation of the patient and ordering and/or  instructing patient on testing and care options.     Lucretia Kern, DO

## 2020-11-27 NOTE — Patient Instructions (Signed)
Seek inperson care right away today at the Urgent Care or the [facility]. If worsening severe or life threatening symptoms please call 911.   I hope you are feeling better soon!   It was nice to meet you today. I help [facility] out with telemedicine visits on Tuesdays and Thursdays and am available for visits on those days. If you have any concerns or questions following this visit please schedule a follow up visit with your Primary Care doctor or seek care at a local urgent care clinic to avoid delays in care.

## 2020-11-28 ENCOUNTER — Emergency Department (INDEPENDENT_AMBULATORY_CARE_PROVIDER_SITE_OTHER)
Admission: EM | Admit: 2020-11-28 | Discharge: 2020-11-28 | Disposition: A | Discharge status: Home / Self Care | Payer: Medicare Other | Source: Home / Self Care | Attending: Family Medicine | Admitting: Family Medicine

## 2020-11-28 ENCOUNTER — Emergency Department (INDEPENDENT_AMBULATORY_CARE_PROVIDER_SITE_OTHER): Payer: Medicare Other

## 2020-11-28 ENCOUNTER — Other Ambulatory Visit: Payer: Self-pay

## 2020-11-28 ENCOUNTER — Encounter: Payer: Self-pay | Admitting: Emergency Medicine

## 2020-11-28 DIAGNOSIS — J4541 Moderate persistent asthma with (acute) exacerbation: Secondary | ICD-10-CM

## 2020-11-28 DIAGNOSIS — R059 Cough, unspecified: Secondary | ICD-10-CM

## 2020-11-28 DIAGNOSIS — R0600 Dyspnea, unspecified: Secondary | ICD-10-CM

## 2020-11-28 DIAGNOSIS — J069 Acute upper respiratory infection, unspecified: Secondary | ICD-10-CM

## 2020-11-28 DIAGNOSIS — R509 Fever, unspecified: Secondary | ICD-10-CM | POA: Diagnosis not present

## 2020-11-28 MED ORDER — LEVOFLOXACIN 500 MG PO TABS: ORAL_TABLET | ORAL | 0 refills | Status: DC

## 2020-11-28 MED ORDER — METHYLPREDNISOLONE ACETATE 80 MG/ML IJ SUSP
80.0000 mg | Freq: Once | INTRAMUSCULAR | Status: AC
  Administered 2020-11-28: 80 mg via INTRAMUSCULAR

## 2020-11-28 NOTE — Discharge Instructions (Addendum)
Take plain guaifenesin ('1200mg'$  extended release tabs such as Mucinex) twice daily, with plenty of water, for cough and congestion. Get adequate rest.   Continue all inhalers as prescribed. Try warm salt water gargles for sore throat.  May continue promethazine/codeine cough syrup at bedtime if needed.

## 2020-11-28 NOTE — ED Provider Notes (Signed)
Veronica [name] CARE    CSN: [id number] Arrival date & time: 11/28/20  1214      History   Chief Complaint Chief Complaint  Patient presents with   Cough    HPI Veronica [name] is a 70 y.o. female.   One week ago patient developed increasing rhinorrhea and sinus congestion, along with development of a cough causing her to increase her use of her albuterol inhaler.  She has asthma and bronchiectasis, for which she also uses Pulmicort and Perforomist.  She denies pleuritic pain but notes that her sputum has become green during the past three days. She believes that she has had fever, taking Tylenol '1000mg'$  Q6hr.  The history is provided by the patient.   Past Medical History:  Diagnosis Date   Adenomatous colon polyp    Asthma    AVM (arteriovenous malformation)    Bronchitis, chronic (HCC)    CAD (coronary artery disease)    Colon polyp 01/04/91   hyperplastic   COPD (chronic obstructive pulmonary disease) (HCC)    CVA (cerebral infarction)    following brain surgery   Depression    Diverticulosis of colon 02/17/06   Endometriosis    Gastric ulcer    GERD (gastroesophageal reflux disease)    History of colonic polyps    Hyperlipidemia    Hypertension    LBP (low back pain)    Migraine headache    OA (osteoarthritis)    PONV (postoperative nausea and vomiting)    slight nausea   Seizure disorder (HCC)    Seizures (HCC)    Shortness of breath dyspnea    Sjogren's disease ([place]) 2010   per Dr. 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   Cramps, extremity 02/28/2020   Gingiva disorder 11/22/2019   Intertrigo 08/23/2019   Seasonal rhinitis 06/15/2019   Tinea corporis 06/29/2018   Hip pain, chronic,  right 04/11/2018   Bronchiectasis ([place]) 01/08/2018   Leg ulcer, right, limited to breakdown of skin ([place]) 01/03/2018   Gastroenteritis 11/04/2017   Ankle swelling 10/12/2017   Neoplasm of uncertain behavior of skin 10/12/2017   Osteoporosis 06/21/2017   Oral thrush 01/28/2017   Atherosclerosis of aorta ([place]) 09/24/2016   Coronary artery disease 06/28/2016   Pulmonary emphysema ([place]) 05/31/2016   Hypothyroidism 11/19/2015   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   Edema 08/01/2015   Abdominal distension 08/01/2015   Weight gain 08/01/2015   Thyroid nodule 04/29/2015   Concussion with loss of consciousness 04/29/2015   Well adult exam 02/11/2014   Rectal discomfort 11/13/2013   Insomnia 06/09/2011   Anxiety 05/04/2011   Depression 03/24/2011   BRONCHITIS 04/24/2010   HYPERKALEMIA 11/14/2009   FEVER UNSPECIFIED 10/03/2009   Bruising 10/03/2009   GRIEF REACTION 06/13/2009   EPIGASTRIC PAIN 01/17/2009   Chronic bronchitis ([place]) 12/24/2008   ABSCESS, TOOTH 12/24/2008   SJOGREN'S SYNDROME 12/24/2008   Vitamin D deficiency 06/27/2008   Cough 03/21/2008   Hypoglycemia, unspecified 06/07/2007   UNS ADVRS EFF UNS RX MEDICINAL&BIOLOGICAL SBSTNC 06/01/2007   Diarrhea 03/29/2007   THRUSH 03/07/2007   Vitamin B12 deficiency 03/07/2007   Asthma 03/07/2007   Osteoarthritis 03/07/2007  LOW BACK PAIN 03/07/2007   PRURITIC CONDITION NEC 12/13/2006   Urticaria 12/13/2006   RASH-NONVESICULAR 12/13/2006   LATE EFFECT, ADVERSE DRUG EFFECT 12/13/2006   Dyslipidemia 10/05/2006   Essential hypertension 10/05/2006   GERD 10/05/2006   DIVERTICULOSIS, COLON 10/05/2006   Epilepsy ([place]) 10/05/2006    Past Surgical History:  Procedure Laterality Date   BRAIN SURGERY  1991   CATARACT EXTRACTION W/ INTRAOCULAR LENS  IMPLANT, BILATERAL     CHOLECYSTECTOMY     COLONOSCOPY     ESOPHAGOGASTRODUODENOSCOPY     x4   HEMORRHOIDECTOMY WITH HEMORRHOID BANDING     LAPAROSCOPIC  NISSEN FUNDOPLICATION     LAPAROSCOPIC OVARIAN CYSTECTOMY     MOUTH SURGERY     NISSEN FUNDOPLICATION  [id number]   TEMPOROMANDIBULAR JOINT SURGERY  02/2001   THYROIDECTOMY N/A 08/29/2015   Procedure:  TOTAL THYROIDECTOMY;  Surgeon: Armandina Gemma, MD;  Location: [facility];  Service: General;  Laterality: N/A;   TOTAL THYROIDECTOMY  08/29/2015    OB History   No obstetric history on file.      Home Medications    Prior to Admission medications   Medication Sig Start Date End Date Taking? Authorizing Provider  levofloxacin (LEVAQUIN) 500 MG tablet Take one tab PO once daily for one week 11/28/20  Yes Meri Pelot, Ishmael Holter, MD  acetaminophen-codeine (TYLENOL #3) 300-30 MG tablet Take 1 tablet by mouth every 6 (six) hours as needed. 09/23/20   Plotnikov, Evie Lacks, MD  albuterol (PROVENTIL) (2.5 MG/3ML) 0.083% nebulizer solution USE 1 VIAL IN NEBULIZER 2 TIMES DAILY. Generic: VENTOLIN 09/12/17   Juanito Doom, MD  albuterol (VENTOLIN HFA) 108 (90 Base) MCG/ACT inhaler INHALE 2 PUFFS INTO THE LUNGS EVERY 4 HOURS AS NEEDED FOR WHEEZING OR SHORTNESS OF BREATH 11/24/20   Plotnikov, Evie Lacks, MD  aspirin 81 MG chewable tablet Chew by mouth daily.    [provider]  atorvastatin (LIPITOR) 40 MG tablet TAKE 1 TABLET(40 MG) BY MOUTH DAILY 10/30/20   Plotnikov, Evie Lacks, MD  B-D INSULIN SYRINGE 1CC/25GX1" 25G X 1" 1 ML MISC USE AS DIRECTED EVERY 2 WEEKS 12/04/19   Plotnikov, Evie Lacks, MD  budesonide (PULMICORT) 0.25 MG/2ML nebulizer solution USE 1 VIAL IN NEBULIZER 2 TIMES DAILY. Generic: PULMICORT 06/12/19   Martyn Ehrich, NP  calcium carbonate (OSCAL) 1500 (600 Ca) MG TABS tablet Take 600 mg of elemental calcium by mouth 2 (two) times daily with a meal.     [provider]  cariprazine (VRAYLAR) capsule Take 1 capsule (1.5 mg total) by mouth daily. Lot # [id number] EXP 0701/22 03/19/20   Plotnikov, Evie Lacks, MD  chlorhexidine (PERIDEX) 0.12 % solution Use to wash mouth twice a day 05/20/20    [provider]  Cholecalciferol (VITAMIN D3) 50 MCG (2000 UT) capsule Take 1 capsule (2,000 Units total) by mouth daily. 11/17/18   Plotnikov, Evie Lacks, MD  clonazePAM (KLONOPIN) 1 MG tablet TAKE 2 TABLETS BY MOUTH EVERY NIGHT AT BEDTIME AND 1/2 TABLET EXTRA IN DAYTIME AS NEEDED 08/26/20   Plotnikov, Evie Lacks, MD  cyanocobalamin (,VITAMIN B-12,) 1000 MCG/ML injection INJECT 1ML IN THE MUSCLE EVERY 14 DAYS. 12/04/19   Plotnikov, Evie Lacks, MD  diphenoxylate-atropine (LOMOTIL) 2.5-0.025 MG tablet Take 1-2 tablets by mouth 4 (four) times daily as needed for diarrhea or loose stools. 08/26/20   Plotnikov, Evie Lacks, MD  Docusate Calcium (STOOL SOFTENER PO) Take 100 mg by mouth as needed (for constipation).     [provider]  escitalopram (LEXAPRO) 20 MG tablet Take 1 tablet (20 mg total) by mouth daily. 08/26/20   Plotnikov, Evie Lacks, MD  formoterol (PERFOROMIST) 20 MCG/2ML nebulizer solution Take 2 mLs (20 mcg total) by nebulization 2 (two) times daily. Patient not taking: Reported on 11/28/2020 06/12/19   Martyn Ehrich, NP  furosemide (LASIX) 20 MG tablet TAKE 1 TABLET(20 MG) BY MOUTH DAILY AS NEEDED 09/01/20   Plotnikov, Evie Lacks, MD  levothyroxine (SYNTHROID) 88 MCG tablet TAKE 1 TABLET(88 MCG) BY MOUTH DAILY 08/26/20   Plotnikov, Evie Lacks, MD  nitroGLYCERIN (NITROSTAT) 0.4 MG SL tablet TAKE UNDER THE TONGUE AS DIRECTED 08/11/20   Plotnikov, Evie Lacks, MD  Omega-3 Fatty Acids (FISH OIL) 1000 MG CAPS Take 1 capsule by mouth daily.    [provider]  phenytoin (DILANTIN) 100 MG ER capsule TAKE 2 CAPSULES BY MOUTH EVERY MORNING AND 1 CAPSULE EVERY EVENING 01/04/20   Plotnikov, Evie Lacks, MD  promethazine (PHENERGAN) 12.5 MG tablet Take by mouth. 10/29/20   [provider]  Promethazine-Codeine 6.25-10 MG/5ML SOLN Take 5 mLs by mouth every 6 (six) hours as needed. Do not take with Tylenol 3 09/23/20   Plotnikov, Evie Lacks, MD  Respiratory Therapy Supplies (FLUTTER)  DEVI Use after nebulization treatments 07/09/16   Javier Glazier, MD  sodium chloride (MURO 128) 5 % ophthalmic solution Place 1 drop into both eyes as needed for irritation.     [provider]  SYRINGE-NEEDLE, DISP, 3 ML (BD ECLIPSE SYRINGE) 25G X 1" 3 ML MISC Use sq q 2 wks 11/22/18   Plotnikov, Evie Lacks, MD  traZODone (DESYREL) 50 MG tablet TAKE 4 TABLETS(200 MG) BY MOUTH AT BEDTIME 08/26/20   Plotnikov, Evie Lacks, MD  verapamil (CALAN-SR) 240 MG CR tablet TAKE 1 TABLET(240 MG) BY MOUTH AT BEDTIME 08/26/20   Plotnikov, Evie Lacks, MD    Family History Family History  Problem Relation Age of Onset   Hypertension Mother    Heart attack Mother    Arthritis Mother    Heart disease Father    Pancreatic cancer Father    Colon cancer Sister 33   Bone cancer Sister    Liver cancer Sister    Hypertension Other    Diabetes Other    Diabetes Brother    Asthma Brother    Emphysema Maternal Aunt        x2   Rheumatologic disease Maternal Grandfather    Esophageal cancer Neg Hx    Stomach cancer Neg Hx    Rectal cancer Neg Hx     Social History Social History   Tobacco Use   Smoking status: Never   Smokeless tobacco: Never   Tobacco comments:    Father & mutiple other family members smoked.  Vaping Use   Vaping Use: Never used  Substance Use Topics   Alcohol use: No   Drug use: No     Allergies   Avelox [moxifloxacin hcl in nacl], Cephalexin, Clindamycin hcl, Moxifloxacin, Amantadine hcl, Bee venom, Cymbalta [duloxetine hcl], Cyproheptadine hcl, Doxycycline hyclate, Effexor [venlafaxine hydrochloride], Effexor [venlafaxine], Fluticasone-salmeterol, Guaifenesin, Imipramine hcl, Lactose intolerance (gi), Latex, Other, Oxycodone-acetaminophen, Phenytoin, Pirbuterol acetate, Remeron [mirtazapine], Salmeterol xinafoate, Viibryd [vilazodone hcl], Zolpidem tartrate, Azithromycin, Ciprofloxacin, Contrast media  [iodinated diagnostic agents], Erythromycin base, Flagyl  [metronidazole hcl], Fluconazole, Fluoxetine, Lansoprazole, Metoclopramide hcl, Montelukast sodium, Penicillins, Propulsid [cisapride], Reglan [metoclopramide], Sulfadiazine, Sulfamethoxazole-trimethoprim, Telithromycin, Tetracycline hcl, Topiramate, and Valproic acid   Review of Systems Review of Systems No sore throat + cough No  pleuritic pain + wheezing + nasal congestion + post-nasal drainage No sinus pain/pressure No itchy/red eyes No earache No hemoptysis No SOB No fever/chills No nausea No vomiting No abdominal pain No diarrhea No urinary symptoms No skin rash + fatigue No myalgias No headache   Physical Exam Triage Vital Signs ED Triage Vitals  Enc Vitals Group     BP 11/28/20 1228 (!) 145/84     Pulse Rate 11/28/20 1228 89     Resp 11/28/20 1228 (!) 24     Temp 11/28/20 1228 99.1 F (37.3 C)     Temp Source 11/28/20 1228 Oral     SpO2 11/28/20 1228 96 %     Weight 11/28/20 1230 141 lb (64 kg)     Height 11/28/20 1230 5' (1.524 m)     Head Circumference --      Peak Flow --      Pain Score 11/28/20 1229 0     Pain Loc --      Pain Edu? --      Excl. in [place]? --    No data found.  Updated Vital Signs BP (!) 145/84 (BP Location: Left Arm)   Pulse 89   Temp 99.1 F (37.3 C) (Oral)   Resp (!) 24   Ht 5' (1.524 m)   Wt 64 kg   SpO2 96%   BMI 27.54 kg/m   Visual Acuity Right Eye Distance:   Left Eye Distance:   Bilateral Distance:    Right Eye Near:   Left Eye Near:    Bilateral Near:     Physical Exam Nursing notes and Vital Signs reviewed. Appearance:  Patient appears stated age, and in no acute distress Eyes:  Pupils are equal, round, and reactive to light and accomodation.  Extraocular movement is intact.  Conjunctivae are not inflamed  Ears:  Canals normal.  Tympanic membranes normal.  Nose:  Mildly congested turbinates.  No sinus tenderness. Pharynx:  Normal Neck:  Supple.  Mildly enlarged lateral nodes are present, tender to  palpation on the left.   Lungs:  Scattered bilateral rhonchi.  Breath sounds are equal.  Moving air well. Heart:  Regular rate and rhythm without murmurs, rubs, or gallops.  Abdomen:  Nontender without masses or hepatosplenomegaly.  Bowel sounds are present.  No CVA or flank tenderness.  Extremities:  No edema.  Skin:  No rash present.   UC Treatments / Results  Labs (all labs ordered are listed, but only abnormal results are displayed) Labs Reviewed  NOVEL CORONAVIRUS, NAA    EKG   Radiology DG Chest 2 View  Result Date: 11/28/2020 CLINICAL DATA:  Cough, fever, dyspnea. EXAM: CHEST - 2 VIEW COMPARISON:  December 03, 2019. FINDINGS: The heart size and mediastinal contours are within normal limits. Both lungs are clear. The visualized skeletal structures are unremarkable. IMPRESSION: No active cardiopulmonary disease. Aortic Atherosclerosis (ICD10-I70.0). Electronically Signed   By: Marijo Conception M.D.   On: 11/28/2020 13:40    Procedures Procedures (including critical care time)  Medications Ordered in UC Medications  methylPREDNISolone acetate (DEPO-MEDROL) injection 80 mg (80 mg Intramuscular Given 11/28/20 1432)    Initial Impression / Assessment and Plan / UC Course  I have reviewed the triage vital signs and the nursing notes.  Pertinent labs & imaging results that were available during my care of the patient were reviewed by me and considered in my medical decision making (see chart for details).    Administered Depo Medrol  $'80mg'M$  IM Begin Levaquin '50mg'$  once daily for one week. Followup with pulmonology in one week.  Final Clinical Impressions(s) / UC Diagnoses   Final diagnoses:  Cough  Viral URI with cough  Moderate persistent asthma with exacerbation     Discharge Instructions      Take plain guaifenesin ('1200mg'$  extended release tabs such as Mucinex) twice daily, with plenty of water, for cough and congestion. Get adequate rest.   Continue all inhalers as  prescribed. Try warm salt water gargles for sore throat.  May continue promethazine/codeine cough syrup at bedtime if needed.  If symptoms become significantly worse during the night or over the weekend, proceed to the local emergency room.   ED Prescriptions     Medication Sig Dispense Auth. Provider   levofloxacin (LEVAQUIN) 500 MG tablet Take one tab PO once daily for one week 7 tablet Kandra Nicolas, MD         Kandra Nicolas, MD 11/30/20 [phone]

## 2020-11-28 NOTE — ED Triage Notes (Signed)
Cough started w/ allergies last weekend Has progressively gotten worse  Uses cough medicine w/ codeine at home Per pt she needs an antibiotic- only one she can take is generic for levaquin Multiple allergies  Tylenol '1000mg'$  q 6 hours  Per pt normal temp is 97.4

## 2020-11-29 LAB — SARS-COV-2, NAA 2 DAY TAT

## 2020-11-29 LAB — NOVEL CORONAVIRUS, NAA: SARS-CoV-2, NAA: NOT DETECTED

## 2020-11-30 NOTE — Progress Notes (Signed)
Patient called w/ negative results- does not have access to My Chart -Veronica [name] stated she was starting to fell better & thanked me for calling with her results

## 2020-12-02 ENCOUNTER — Telehealth: Payer: Self-pay | Admitting: Internal Medicine

## 2020-12-02 MED ORDER — TRAZODONE HCL 50 MG PO TABS: ORAL_TABLET | ORAL | 1 refills | Status: DC

## 2020-12-02 NOTE — Telephone Encounter (Signed)
Reviewed chart pt is up-to-date sent refills to [facility]  

## 2020-12-02 NOTE — Telephone Encounter (Signed)
1.Medication Requested: traZODone (DESYREL) 50 MG tablet  2. Pharmacy (Name, [place]): [facility] [id number] - [place], [place]  Phone:  [phone] Fax:  [phone]   3. On Med List: yes  4. Last Visit with PCP: 07.12.22  5. Next visit date with PCP: 10.04.22   Agent: Please be advised that RX refills may take up to 3 business days. We ask that you follow-up with your pharmacy.

## 2020-12-03 ENCOUNTER — Other Ambulatory Visit: Payer: Self-pay

## 2020-12-03 ENCOUNTER — Encounter: Payer: Self-pay | Admitting: Internal Medicine

## 2020-12-03 ENCOUNTER — Ambulatory Visit (INDEPENDENT_AMBULATORY_CARE_PROVIDER_SITE_OTHER): Payer: Medicare Other | Admitting: Internal Medicine

## 2020-12-03 DIAGNOSIS — R059 Cough, unspecified: Secondary | ICD-10-CM | POA: Diagnosis not present

## 2020-12-03 DIAGNOSIS — J479 Bronchiectasis, uncomplicated: Secondary | ICD-10-CM

## 2020-12-03 DIAGNOSIS — J42 Unspecified chronic bronchitis: Secondary | ICD-10-CM

## 2020-12-03 DIAGNOSIS — J209 Acute bronchitis, unspecified: Secondary | ICD-10-CM | POA: Diagnosis not present

## 2020-12-03 MED ORDER — PROMETHAZINE-CODEINE 6.25-10 MG/5ML PO SOLN: 5.0000 mL | Freq: Four times a day (QID) | ORAL | 1 refills | Status: DC | PRN

## 2020-12-03 MED ORDER — LEVOFLOXACIN 500 MG PO TABS: ORAL_TABLET | ORAL | 0 refills | Status: DC

## 2020-12-03 MED ORDER — METHYLPREDNISOLONE ACETATE 80 MG/ML IJ SUSP
80.0000 mg | Freq: Once | INTRAMUSCULAR | Status: AC
  Administered 2020-12-03: 80 mg via INTRAMUSCULAR

## 2020-12-03 NOTE — Progress Notes (Signed)
Subjective:  Patient ID: SHENAYA LEBO, female    DOB: 04-07-1950  Age: 70 y.o. MRN: [id number]  CC: Follow-up (3 MONTH F/U) and Medication Refill (Req refill Promethazine cough syrup)   HPI Yurika Pereda Rabbani presents for CAP (neg CXR) on 9/16  - Taking Levaquin x 7 d (one pill left). Doing better, not well yet... C/o fever 100.4, cough  Outpatient Medications Prior to Visit  Medication Sig Dispense Refill   acetaminophen-codeine (TYLENOL #3) 300-30 MG tablet Take 1 tablet by mouth every 6 (six) hours as needed. 60 tablet 2   albuterol (PROVENTIL) (2.5 MG/3ML) 0.083% nebulizer solution USE 1 VIAL IN NEBULIZER 2 TIMES DAILY. Generic: VENTOLIN 60 vial 5   albuterol (VENTOLIN HFA) 108 (90 Base) MCG/ACT inhaler INHALE 2 PUFFS INTO THE LUNGS EVERY 4 HOURS AS NEEDED FOR WHEEZING OR SHORTNESS OF BREATH 8.5 g 0   aspirin 81 MG chewable tablet Chew by mouth daily.     atorvastatin (LIPITOR) 40 MG tablet TAKE 1 TABLET(40 MG) BY MOUTH DAILY 90 tablet 3   B-D INSULIN SYRINGE 1CC/25GX1" 25G X 1" 1 ML MISC USE AS DIRECTED EVERY 2 WEEKS 50 each 5   budesonide (PULMICORT) 0.25 MG/2ML nebulizer solution USE 1 VIAL IN NEBULIZER 2 TIMES DAILY. Generic: PULMICORT 120 mL 2   calcium carbonate (OSCAL) 1500 (600 Ca) MG TABS tablet Take 600 mg of elemental calcium by mouth 2 (two) times daily with a meal.      cariprazine (VRAYLAR) capsule Take 1 capsule (1.5 mg total) by mouth daily. Lot # [id number] EXP 0701/22 35 capsule 0   chlorhexidine (PERIDEX) 0.12 % solution Use to wash mouth twice a day     Cholecalciferol (VITAMIN D3) 50 MCG (2000 UT) capsule Take 1 capsule (2,000 Units total) by mouth daily. 100 capsule 3   clonazePAM (KLONOPIN) 1 MG tablet TAKE 2 TABLETS BY MOUTH EVERY NIGHT AT BEDTIME AND 1/2 TABLET EXTRA IN DAYTIME AS NEEDED 225 tablet 1   cyanocobalamin (,VITAMIN B-12,) 1000 MCG/ML injection INJECT 1ML IN THE MUSCLE EVERY 14 DAYS. 10 mL 5   diphenoxylate-atropine (LOMOTIL) 2.5-0.025 MG tablet Take  1-2 tablets by mouth 4 (four) times daily as needed for diarrhea or loose stools. 60 tablet 1   Docusate Calcium (STOOL SOFTENER PO) Take 100 mg by mouth as needed (for constipation).      escitalopram (LEXAPRO) 20 MG tablet Take 1 tablet (20 mg total) by mouth daily. 90 tablet 3   formoterol (PERFOROMIST) 20 MCG/2ML nebulizer solution Take 2 mLs (20 mcg total) by nebulization 2 (two) times daily. 120 mL 3   furosemide (LASIX) 20 MG tablet TAKE 1 TABLET(20 MG) BY MOUTH DAILY AS NEEDED 90 tablet 3   levothyroxine (SYNTHROID) 88 MCG tablet TAKE 1 TABLET(88 MCG) BY MOUTH DAILY 90 tablet 3   nitroGLYCERIN (NITROSTAT) 0.4 MG SL tablet TAKE UNDER THE TONGUE AS DIRECTED 25 tablet 3   Omega-3 Fatty Acids (FISH OIL) 1000 MG CAPS Take 1 capsule by mouth daily.     phenytoin (DILANTIN) 100 MG ER capsule TAKE 2 CAPSULES BY MOUTH EVERY MORNING AND 1 CAPSULE EVERY EVENING 270 capsule 3   promethazine (PHENERGAN) 12.5 MG tablet Take by mouth.     Respiratory Therapy Supplies (FLUTTER) DEVI Use after nebulization treatments 1 each 0   sodium chloride (MURO 128) 5 % ophthalmic solution Place 1 drop into both eyes as needed for irritation.      SYRINGE-NEEDLE, DISP, 3 ML (BD ECLIPSE SYRINGE) 25G X  1" 3 ML MISC Use sq q 2 wks 50 each 3   traZODone (DESYREL) 50 MG tablet TAKE 4 TABLETS(200 MG) BY MOUTH AT BEDTIME 360 tablet 1   verapamil (CALAN-SR) 240 MG CR tablet TAKE 1 TABLET(240 MG) BY MOUTH AT BEDTIME 90 tablet 3   levofloxacin (LEVAQUIN) 500 MG tablet Take one tab PO once daily for one week 7 tablet 0   Promethazine-Codeine 6.25-10 MG/5ML SOLN Take 5 mLs by mouth every 6 (six) hours as needed. Do not take with Tylenol 3 300 mL 1   No facility-administered medications prior to visit.    ROS: Review of Systems  Constitutional:  Positive for chills and fatigue. Negative for activity change, appetite change and unexpected weight change.  HENT:  Negative for congestion, mouth sores and sinus pressure.   Eyes:   Negative for visual disturbance.  Respiratory:  Positive for cough, chest tightness and shortness of breath.   Cardiovascular:  Positive for chest pain.  Gastrointestinal:  Negative for abdominal pain and nausea.  Genitourinary:  Negative for difficulty urinating, frequency and vaginal pain.  Musculoskeletal:  Positive for gait problem. Negative for back pain.  Skin:  Negative for pallor and rash.  Neurological:  Positive for weakness. Negative for dizziness, tremors, numbness and headaches.  Psychiatric/Behavioral:  Positive for sleep disturbance. Negative for confusion and dysphoric mood. The patient is nervous/anxious.    Objective:  BP (!) 162/78 (BP Location: Left Arm)   Pulse 73   Temp 98.7 F (37.1 C) (Oral)   Ht 5' (1.524 m)   Wt 142 lb (64.4 kg)   SpO2 96%   BMI 27.73 kg/m   BP Readings from Last 3 Encounters:  12/03/20 (!) 162/78  11/28/20 (!) 145/84  11/27/20 140/82    Wt Readings from Last 3 Encounters:  12/03/20 142 lb (64.4 kg)  11/28/20 141 lb (64 kg)  09/23/20 142 lb 9.6 oz (64.7 kg)    Physical Exam Constitutional:      General: She is not in acute distress.    Appearance: She is well-developed. She is ill-appearing. She is not toxic-appearing.  HENT:     Head: Normocephalic.     Right Ear: External ear normal.     Left Ear: External ear normal.     Nose: Nose normal.  Eyes:     General:        Right eye: No discharge.        Left eye: No discharge.     Conjunctiva/sclera: Conjunctivae normal.     Pupils: Pupils are equal, round, and reactive to light.  Neck:     Thyroid: No thyromegaly.     Vascular: No JVD.     Trachea: No tracheal deviation.  Cardiovascular:     Rate and Rhythm: Normal rate and regular rhythm.     Heart sounds: Normal heart sounds.    No gallop.  Pulmonary:     Effort: No respiratory distress.     Breath sounds: No stridor. Rhonchi present. No wheezing or rales.  Chest:     Chest wall: No tenderness.  Abdominal:      General: Bowel sounds are normal. There is no distension.     Palpations: Abdomen is soft. There is no mass.     Tenderness: There is no abdominal tenderness. There is no guarding or rebound.  Musculoskeletal:        General: Tenderness present.     Cervical back: Normal range of motion and neck supple. No  rigidity.  Lymphadenopathy:     Cervical: No cervical adenopathy.  Skin:    Findings: No erythema or rash.  Neurological:     Mental Status: She is oriented to person, place, and time.     Cranial Nerves: No cranial nerve deficit.     Motor: No abnormal muscle tone.     Coordination: Coordination normal.     Deep Tendon Reflexes: Reflexes normal.  Psychiatric:        Behavior: Behavior normal.        Thought Content: Thought content normal.        Judgment: Judgment normal.  Mild ronchi Using a cane Lab Results  Component Value Date   WBC 11.8 (H) 08/26/2020   HGB 14.5 08/26/2020   HCT 42.8 08/26/2020   PLT 345.0 08/26/2020   GLUCOSE 109 (H) 08/26/2020   CHOL 249 (H) 11/10/2018   TRIG 88.0 11/10/2018   HDL 57.70 11/10/2018   LDLDIRECT 165.0 09/26/2009   LDLCALC 174 (H) 11/10/2018   ALT 45 (H) 08/26/2020   AST 52 (H) 08/26/2020   NA 137 08/26/2020   K 3.4 (L) 08/26/2020   CL 101 08/26/2020   CREATININE 0.66 08/26/2020   BUN 12 08/26/2020   CO2 24 08/26/2020   TSH 2.22 11/10/2018   INR 1.0 ratio 11/07/2009   HGBA1C 5.6 08/23/2019    DG Chest 2 View  Result Date: 11/28/2020 CLINICAL DATA:  Cough, fever, dyspnea. EXAM: CHEST - 2 VIEW COMPARISON:  December 03, 2019. FINDINGS: The heart size and mediastinal contours are within normal limits. Both lungs are clear. The visualized skeletal structures are unremarkable. IMPRESSION: No active cardiopulmonary disease. Aortic Atherosclerosis (ICD10-I70.0). Electronically Signed   By: Marijo Conception M.D.   On: 11/28/2020 13:40    Assessment & Plan:   Problem List Items Addressed This Visit     Acute bronchitis    Not  well yet Will give another 7 days of Levaquin      Bronchiectasis ([place])    Will give another 7 days of Levaquin      Chronic bronchitis ([place])    Worse Will give another 7 days of Levaquin. Prom - cod syr Depo medrol IM for bronchospasms      Cough    Worse CXR was ok Will give another 7 days of Levaquin. Prom - cod syr Depo medrol IM for bronchospasms          Follow-up: No follow-ups on file.  Walker Kehr, MD

## 2020-12-03 NOTE — Assessment & Plan Note (Addendum)
Worse CXR was ok Will give another 7 days of Levaquin. Prom - cod syr Depo medrol IM for bronchospasms

## 2020-12-03 NOTE — Assessment & Plan Note (Signed)
Worse Will give another 7 days of Levaquin. Prom - cod syr Depo medrol IM for bronchospasms

## 2020-12-03 NOTE — Assessment & Plan Note (Signed)
Will give another 7 days of Levaquin

## 2020-12-03 NOTE — Assessment & Plan Note (Signed)
Not well yet Will give another 7 days of Levaquin

## 2020-12-05 ENCOUNTER — Other Ambulatory Visit: Payer: Self-pay | Admitting: *Deleted

## 2020-12-05 NOTE — Patient Outreach (Signed)
[name]) Care Management  [name]  12/05/2020   Veronica [name] [birth year] [id number]  Subjective: Successful telephone outreach call to patient. HIPAA identifiers obtained. Patient states she is experiencing a COPD exacerbation. She went to the urgent care on 11/28/20 and followed up with her PCP on 12/02/20. Patient continues to take antibiotics, cough medicine, rescue medication, and reports that she is slowly improving. Patient is in close contact with her PCP and will contact Dr. Alain Marion as needed. Nurse discussed with patient her health goals and her health and wellness needs which were documented in the Epic system. Patient did not have any further questions or concerns today and did confirm that she has this nurse's contact number to call her if needed.   Encounter Medications:  Outpatient Encounter Medications as of 12/05/2020  Medication Sig Note   acetaminophen-codeine (TYLENOL #3) 300-30 MG tablet Take 1 tablet by mouth every 6 (six) hours as needed.    albuterol (PROVENTIL) (2.5 MG/3ML) 0.083% nebulizer solution USE 1 VIAL IN NEBULIZER 2 TIMES DAILY. Generic: VENTOLIN    albuterol (VENTOLIN HFA) 108 (90 Base) MCG/ACT inhaler INHALE 2 PUFFS INTO THE LUNGS EVERY 4 HOURS AS NEEDED FOR WHEEZING OR SHORTNESS OF BREATH    aspirin 81 MG chewable tablet Chew by mouth daily.    atorvastatin (LIPITOR) 40 MG tablet TAKE 1 TABLET(40 MG) BY MOUTH DAILY    B-D INSULIN SYRINGE 1CC/25GX1" 25G X 1" 1 ML MISC USE AS DIRECTED EVERY 2 WEEKS    budesonide (PULMICORT) 0.25 MG/2ML nebulizer solution USE 1 VIAL IN NEBULIZER 2 TIMES DAILY. Generic: PULMICORT    calcium carbonate (OSCAL) 1500 (600 Ca) MG TABS tablet Take 600 mg of elemental calcium by mouth 2 (two) times daily with a meal.     cariprazine (VRAYLAR) capsule Take 1 capsule (1.5 mg total) by mouth daily. Lot # [id number] EXP 0701/22    chlorhexidine (PERIDEX) 0.12 % solution Use to wash mouth twice a day     Cholecalciferol (VITAMIN D3) 50 MCG (2000 UT) capsule Take 1 capsule (2,000 Units total) by mouth daily.    clonazePAM (KLONOPIN) 1 MG tablet TAKE 2 TABLETS BY MOUTH EVERY NIGHT AT BEDTIME AND 1/2 TABLET EXTRA IN DAYTIME AS NEEDED    cyanocobalamin (,VITAMIN B-12,) 1000 MCG/ML injection INJECT 1ML IN THE MUSCLE EVERY 14 DAYS.    diphenoxylate-atropine (LOMOTIL) 2.5-0.025 MG tablet Take 1-2 tablets by mouth 4 (four) times daily as needed for diarrhea or loose stools.    Docusate Calcium (STOOL SOFTENER PO) Take 100 mg by mouth as needed (for constipation).     escitalopram (LEXAPRO) 20 MG tablet Take 1 tablet (20 mg total) by mouth daily.    formoterol (PERFOROMIST) 20 MCG/2ML nebulizer solution Take 2 mLs (20 mcg total) by nebulization 2 (two) times daily.    furosemide (LASIX) 20 MG tablet TAKE 1 TABLET(20 MG) BY MOUTH DAILY AS NEEDED    levofloxacin (LEVAQUIN) 500 MG tablet Take one tab PO once daily for one week    levothyroxine (SYNTHROID) 88 MCG tablet TAKE 1 TABLET(88 MCG) BY MOUTH DAILY    nitroGLYCERIN (NITROSTAT) 0.4 MG SL tablet TAKE UNDER THE TONGUE AS DIRECTED    Omega-3 Fatty Acids (FISH OIL) 1000 MG CAPS Take 1 capsule by mouth daily.    phenytoin (DILANTIN) 100 MG ER capsule TAKE 2 CAPSULES BY MOUTH EVERY MORNING AND 1 CAPSULE EVERY EVENING    promethazine (PHENERGAN) 12.5 MG tablet Take by mouth.    Promethazine-Codeine  6.25-10 MG/5ML SOLN Take 5 mLs by mouth every 6 (six) hours as needed. Do not take with Tylenol 3    Respiratory Therapy Supplies (FLUTTER) DEVI Use after nebulization treatments    sodium chloride (MURO 128) 5 % ophthalmic solution Place 1 drop into both eyes as needed for irritation.  07/10/2019: She is using daily.   SYRINGE-NEEDLE, DISP, 3 ML (BD ECLIPSE SYRINGE) 25G X 1" 3 ML MISC Use sq q 2 wks    traZODone (DESYREL) 50 MG tablet TAKE 4 TABLETS(200 MG) BY MOUTH AT BEDTIME    verapamil (CALAN-SR) 240 MG CR tablet TAKE 1 TABLET(240 MG) BY MOUTH AT BEDTIME    No  facility-administered encounter medications on file as of 12/05/2020.    Functional Status:  In your present state of health, do you have any difficulty performing the following activities: 03/10/2020  Hearing? N  Vision? N  Difficulty concentrating or making decisions? Y  Walking or climbing stairs? N  Dressing or bathing? N  Doing errands, shopping? N  Preparing Food and eating ? N  Using the Toilet? N  In the past six months, have you accidently leaked urine? Y  Do you have problems with loss of bowel control? N  Managing your Medications? N  Managing your Finances? N  Housekeeping or managing your Housekeeping? N  Some recent data might be hidden    Fall/Depression Screening: Fall Risk  12/05/2020 08/28/2020 07/08/2020  Falls in the past year? 1 1 1   Comment - - -  Number falls in past yr: 0 0 0  Comment - - -  Injury with Fall? 1 1 1   Risk for fall due to : Impaired mobility;Impaired balance/gait;History of fall(s) History of fall(s);Impaired balance/gait;Impaired mobility History of fall(s);Impaired mobility;Impaired balance/gait  Follow up Falls prevention discussed;Education provided;Falls evaluation completed Falls prevention discussed;Education provided;Falls evaluation completed -   PHQ 2/9 Scores 04/16/2020 03/10/2020 12/19/2019 11/22/2019 05/08/2019 02/27/2019 02/21/2018  PHQ - 2 Score 3 1 2  0 4 4 4   PHQ- 9 Score 9 - 8 0 7 8 9     Assessment:   Care Plan There are no care plans that you recently modified to display for this patient.    Goals Addressed             This Visit's Progress    [facility]) Patient will verbalize continuation of eating healthy for the next 90 days       Timeframe:  Long-Range Goal Priority:  Medium Start Date: 12/19/19                             Expected End Date: 01/11/21                     Follow Up Date 03/13/21   - set goal weight - manage portion size - prepare main meal at home 3 to 5 days each week - read food labels for fat,  fiber, carbohydrates and portion size - set a realistic goal   - drink at least 4 glasses of water daily -Discussed drinking Pedialyte/Gatorade to stay hydrated  -Discussed eating high probiotic yogurt  -Encouraged patient to eat small amounts frequently       Why is this important?   When you are ready to manage your nutrition or weight, having a plan and setting goals will help.  Taking small steps to change how you eat and exercise is a good place to  start.    Notes: 12/05/20: Patient continues to limit her salt intake. She continues to be limited to consuming soft foods do to ongoing oral complications with her dentures. Patient is supplementing with Boost and Ensure. She is staying hydrated and is tolerating soft foods. She has a follow up appointment with her oral surgeon in November.   12/19/19: Patient monitors sodium content in foods and reports she is making healthier food choices. Nurse sent patient Planning Healthy Meals booklet.   Updated 04/16/20: Patient states she will try to eat 3 servings of fruits/vegetables daily, monitor her salt intake, and will drink 4 glasses of water daily. States she does want to lose weight by continuing to eat healthier, do chair exercises and be as active as tolerated.   Updated 08/27/20: Patient is limited with what she can eat presently due to extensive oral surgery. She is drinking Boost, Ensure, and drinking as much water as possible to stay hydrated. Discussed drinking Pedialyte/Gatorade as needed.     [facility]) Patient will verbalize continuation of tracking and monitoring her COPD symptoms for the next 90 days       Timeframe:  Long-Range Goal Priority:  High Start Date:   12/19/19                          Expected End Date:  01/11/21                    Follow Up Date 01/11/21   - develop a rescue plan - eliminate symptom triggers at home - follow rescue plan if symptoms flare-up - keep follow-up appointments  -Discussed staying aware of  avoiding triggers that cause exacerbations -Encouraged rest and purse lip breathing as needed  -Encouraged continuation of drinking boost, staying hydrated, and increasing her diet to soft foods as tolerated. -Discussed eating yogurt with probiotics to help with healthy limiting diarrhea -Discussed drinking Pedialyte as needed to avoid hydration  Why is this important?   Tracking your symptoms and other information about your health helps your doctor plan your care.  Write down the symptoms, the time of day, what you were doing and what medicine you are taking.  You will soon learn how to manage your symptoms.     Notes: 12/05/20: Patient currently is experiencing a COPD exacerbation. She presented to [facility] on 11/28/20 where she had a chest x-ray, steroid shot, and received a prescription for antibiotics and cough medicine. Patient followed up with her PCP on 12/02/20. PCP prescribed more antibiotics and gave another Dep Medrol IM shot. Patient explains that she is slowly feeling better. She continues to use her rescue medications and is in close contact with her PCP as needed.   12/19/19: Patient does eliminate symptom triggers at home. Nurse sent her COPD zones and action plans. Patient does contact her provider as needed when she has an COPD exacerbation. Updated: 01/29/20 Updated 04/16/20: Patient tracks her COPD symptoms daily and calls her providers when symptoms start to get out of her baseline. Patient limits her outside activity during very cold and hot days. She has eliminated triggers within her home. Patient follows her COPD regiment at home daily of taking her inhalers, nebulizers, and using flutter device.   Updated 07/08/20: Patient currently is undergoing dental issues where she is having multiple ongoing surgeries. Due to the steroids in her breathing treatments she is limited about using them routinely to avoid infection. She reports her  COPD is at baseline currently and she is  using Peridex mouthwash. Patient continues to track her COPD symptoms daily and calls her providers when symptoms start to get out of her baseline. Patient limits her outside activity during very cold and hot days. She has eliminated triggers within her home.   Updated 08/27/20: Patient reports she continues to be limited with using her respiratory medications due to her recovering from surgery. Patient explains that currently her COPD is at baseline.  Updated 09/26/20: Patient states that her COPD is at baseline. She cannot hold the flutter device in her mouth due to her surgery but is now allowed to use her rescue inhaler and pulmicort nebulizer.        [facility]) Patient will verbalize maintaining her weight during the long-term ongoing oral surgeries she will ecounter within the next 90 days       Timeframe:  Long-Range Goal Priority:  High Start Date:  07/08/20                           Expected End Date: 02/11/21 Follow Up Date: 03/13/21  -Discussed continuation of drinking Boost and Ensure with high protein for healing and calories -Encouraged continuation of drinking 4 glasses of water daily to maintain hydration -Discussed continuation of eating soft foods  -Discussed continuation of strict oral hygiene to avoid mouth infection -Discussed breathing medications have steroids which can lead to mouth infection and encouraged patient to continue to rinse her mouth and use Peridex mouthwash after taking breathing medications -Discussed weighing daily for better awareness of weight loss -Discussed drinking Pedialyte/Gatorade to stay hydrated  -Discussed eating high probiotic yogurt  -Encouraged patient to eat small amounts frequently  Notes: 12/05/20: Patient reports that she is maintaining her weight. Patient continues to drink Boost and Ensure. She is able to eat soft foods and she is staying hydrated. Patient has an appointment in November to follow up regarding relining her bottom dentures and  placing caps that will help to anchor her dentures. Nurse will send patient Ensure coupons.   07/08/20: Patient reports losing approximately 8 pounds from continuation of dental and mouth surgeries. Nurse will send patient a scale for daily weight monitoring and will send Ensure coupons.                     Updated 08/27/20: Patient reports that she has lost 11 pounds since her oral surgery. She states that she developed severe diarrhea and has had a hard time with maintaining her weight. Patient did go to her PCP and discussed her diarrhea and fatigue. She adds that her diarrhea has stopped and she is beginning to feel some better.   Updated 09/26/20: Patient reports that her mouth pain has improved. She now has her dentures but will need to go back and have the bottom plate adjusted as well as go to the oral surgeon to have more implants placed. She is now eating soft foods and continues to supplement with Boost and Ensure. Patient states she is staying hydrated by drinking a lot of water and will go get some Gatorade to drink as needed. Patient states she has lost a few more pounds but feels she will be able to control this now that she is able to eat more.        Plan: RN Health Coach will send PCP a quarterly update, will send patient Ensure coupons, and will call patient within the  next several business day to follow up. Follow-up: Patient agrees to Care Plan and Follow-up.  Emelia Loron RN, BSN [place] [phone] [email or web address]

## 2020-12-05 NOTE — Patient Instructions (Signed)
Goals Addressed             This Visit's Progress    [facility]) Patient will verbalize continuation of eating healthy for the next 90 days       Timeframe:  Long-Range Goal Priority:  Medium Start Date: 12/19/19                             Expected End Date: 01/11/21                     Follow Up Date 03/13/21   - set goal weight - manage portion size - prepare main meal at home 3 to 5 days each week - read food labels for fat, fiber, carbohydrates and portion size - set a realistic goal   - drink at least 4 glasses of water daily -Discussed drinking Pedialyte/Gatorade to stay hydrated  -Discussed eating high probiotic yogurt  -Encouraged patient to eat small amounts frequently       Why is this important?   When you are ready to manage your nutrition or weight, having a plan and setting goals will help.  Taking small steps to change how you eat and exercise is a good place to start.    Notes: 12/05/20: Patient continues to limit her salt intake. She continues to be limited to consuming soft foods do to ongoing oral complications with her dentures. Patient is supplementing with Boost and Ensure. She is staying hydrated and is tolerating soft foods. She has a follow up appointment with her oral surgeon in November.   12/19/19: Patient monitors sodium content in foods and reports she is making healthier food choices. Nurse sent patient Planning Healthy Meals booklet.   Updated 04/16/20: Patient states she will try to eat 3 servings of fruits/vegetables daily, monitor her salt intake, and will drink 4 glasses of water daily. States she does want to lose weight by continuing to eat healthier, do chair exercises and be as active as tolerated.   Updated 08/27/20: Patient is limited with what she can eat presently due to extensive oral surgery. She is drinking Boost, Ensure, and drinking as much water as possible to stay hydrated. Discussed drinking Pedialyte/Gatorade as needed.     [facility])  Patient will verbalize continuation of tracking and monitoring her COPD symptoms for the next 90 days       Timeframe:  Long-Range Goal Priority:  High Start Date:   12/19/19                          Expected End Date:  01/11/21                    Follow Up Date 01/11/21   - develop a rescue plan - eliminate symptom triggers at home - follow rescue plan if symptoms flare-up - keep follow-up appointments  -Discussed staying aware of avoiding triggers that cause exacerbations -Encouraged rest and purse lip breathing as needed  -Encouraged continuation of drinking boost, staying hydrated, and increasing her diet to soft foods as tolerated. -Discussed eating yogurt with probiotics to help with healthy limiting diarrhea -Discussed drinking Pedialyte as needed to avoid hydration  Why is this important?   Tracking your symptoms and other information about your health helps your doctor plan your care.  Write down the symptoms, the time of day, what you were doing and what medicine you are  taking.  You will soon learn how to manage your symptoms.     Notes: 12/05/20: Patient currently is experiencing a COPD exacerbation. She presented to [facility] on 11/28/20 where she had a chest x-ray, steroid shot, and received a prescription for antibiotics and cough medicine. Patient followed up with her PCP on 12/02/20. PCP prescribed more antibiotics and gave another Dep Medrol IM shot. Patient explains that she is slowly feeling better. She continues to use her rescue medications and is in close contact with her PCP as needed.   12/19/19: Patient does eliminate symptom triggers at home. Nurse sent her COPD zones and action plans. Patient does contact her provider as needed when she has an COPD exacerbation. Updated: 01/29/20 Updated 04/16/20: Patient tracks her COPD symptoms daily and calls her providers when symptoms start to get out of her baseline. Patient limits her outside activity during very cold and  hot days. She has eliminated triggers within her home. Patient follows her COPD regiment at home daily of taking her inhalers, nebulizers, and using flutter device.   Updated 07/08/20: Patient currently is undergoing dental issues where she is having multiple ongoing surgeries. Due to the steroids in her breathing treatments she is limited about using them routinely to avoid infection. She reports her COPD is at baseline currently and she is using Peridex mouthwash. Patient continues to track her COPD symptoms daily and calls her providers when symptoms start to get out of her baseline. Patient limits her outside activity during very cold and hot days. She has eliminated triggers within her home.   Updated 08/27/20: Patient reports she continues to be limited with using her respiratory medications due to her recovering from surgery. Patient explains that currently her COPD is at baseline.  Updated 09/26/20: Patient states that her COPD is at baseline. She cannot hold the flutter device in her mouth due to her surgery but is now allowed to use her rescue inhaler and pulmicort nebulizer.        [facility]) Patient will verbalize maintaining her weight during the long-term ongoing oral surgeries she will ecounter within the next 90 days       Timeframe:  Long-Range Goal Priority:  High Start Date:  07/08/20                           Expected End Date: 02/11/21 Follow Up Date: 03/13/21  -Discussed continuation of drinking Boost and Ensure with high protein for healing and calories -Encouraged continuation of drinking 4 glasses of water daily to maintain hydration -Discussed continuation of eating soft foods  -Discussed continuation of strict oral hygiene to avoid mouth infection -Discussed breathing medications have steroids which can lead to mouth infection and encouraged patient to continue to rinse her mouth and use Peridex mouthwash after taking breathing medications -Discussed weighing daily for better  awareness of weight loss -Discussed drinking Pedialyte/Gatorade to stay hydrated  -Discussed eating high probiotic yogurt  -Encouraged patient to eat small amounts frequently  Notes: 12/05/20: Patient reports that she is maintaining her weight. Patient continues to drink Boost and Ensure. She is able to eat soft foods and she is staying hydrated. Patient has an appointment in November to follow up regarding relining her bottom dentures and placing caps that will help to anchor her dentures. Nurse will send patient Ensure coupons.   07/08/20: Patient reports losing approximately 8 pounds from continuation of dental and mouth surgeries. Nurse will send patient a  scale for daily weight monitoring and will send Ensure coupons.                     Updated 08/27/20: Patient reports that she has lost 11 pounds since her oral surgery. She states that she developed severe diarrhea and has had a hard time with maintaining her weight. Patient did go to her PCP and discussed her diarrhea and fatigue. She adds that her diarrhea has stopped and she is beginning to feel some better.   Updated 09/26/20: Patient reports that her mouth pain has improved. She now has her dentures but will need to go back and have the bottom plate adjusted as well as go to the oral surgeon to have more implants placed. She is now eating soft foods and continues to supplement with Boost and Ensure. Patient states she is staying hydrated by drinking a lot of water and will go get some Gatorade to drink as needed. Patient states she has lost a few more pounds but feels she will be able to control this now that she is able to eat more.

## 2020-12-07 ENCOUNTER — Other Ambulatory Visit: Payer: Self-pay | Admitting: Internal Medicine

## 2020-12-08 ENCOUNTER — Ambulatory Visit (INDEPENDENT_AMBULATORY_CARE_PROVIDER_SITE_OTHER): Payer: Medicare Other | Admitting: Psychology

## 2020-12-08 ENCOUNTER — Telehealth: Payer: Self-pay | Admitting: *Deleted

## 2020-12-08 DIAGNOSIS — F331 Major depressive disorder, recurrent, moderate: Secondary | ICD-10-CM | POA: Diagnosis not present

## 2020-12-08 NOTE — Telephone Encounter (Signed)
Called [facility] spoke w/ Kaitlynn placed order for pt V[place]ler. She states office will receive in 7-10 days. Order ID # [id number].Marland KitchenJohny Chess

## 2020-12-11 ENCOUNTER — Other Ambulatory Visit: Payer: Self-pay

## 2020-12-11 ENCOUNTER — Encounter: Payer: Self-pay | Admitting: Pulmonary Disease

## 2020-12-11 ENCOUNTER — Ambulatory Visit (INDEPENDENT_AMBULATORY_CARE_PROVIDER_SITE_OTHER): Payer: Medicare Other | Admitting: Pulmonary Disease

## 2020-12-11 ENCOUNTER — Telehealth: Payer: Self-pay

## 2020-12-11 VITALS — BP 120/54 | HR 67 | Temp 98.7°F | Ht 60.0 in | Wt 142.0 lb

## 2020-12-11 DIAGNOSIS — J479 Bronchiectasis, uncomplicated: Secondary | ICD-10-CM | POA: Diagnosis not present

## 2020-12-11 DIAGNOSIS — J4531 Mild persistent asthma with (acute) exacerbation: Secondary | ICD-10-CM

## 2020-12-11 MED ORDER — BUDESONIDE 0.5 MG/2ML IN SUSP: 0.5000 mg | Freq: Two times a day (BID) | RESPIRATORY_TRACT | 11 refills | Status: DC
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
## 2020-12-11 NOTE — Telephone Encounter (Signed)
Called and spoke to patient about her concern of not being able to smell. Patients symptoms started three weeks ago and Per Dr Silas Flood it is up to her if she would like to take a home Covid test but since she has been dealing with a lot of congestion that's where her loss of smell is most likely coming from.

## 2020-12-11 NOTE — Patient Instructions (Signed)
Nice to see you again  Sorry you re not feeling well  The cxr was reassuring  Increase budesonide to 0.5 mg twice a day via nebulizer - ok to use 2 vials of the current dose until the new vials of the higher dose are mailed out.   Finish the antibiotic  I will see you back in 2 months
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
## 2020-12-12 ENCOUNTER — Other Ambulatory Visit: Payer: Self-pay | Admitting: Internal Medicine

## 2020-12-12 ENCOUNTER — Other Ambulatory Visit: Payer: Self-pay | Admitting: *Deleted

## 2020-12-12 DIAGNOSIS — E538 Deficiency of other specified B group vitamins: Secondary | ICD-10-CM

## 2020-12-12 DIAGNOSIS — L509 Urticaria, unspecified: Secondary | ICD-10-CM

## 2020-12-12 DIAGNOSIS — E559 Vitamin D deficiency, unspecified: Secondary | ICD-10-CM

## 2020-12-12 DIAGNOSIS — E875 Hyperkalemia: Secondary | ICD-10-CM
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
## 2020-12-16 ENCOUNTER — Ambulatory Visit: Payer: Medicare Other | Admitting: Internal Medicine

## 2020-12-16 NOTE — Telephone Encounter (Signed)
Called pt to inform her we rec'd Vrayler pt assistance.. there was no answer LMOM medication is ready for pick-up../l;mb

## 2020-12-17 ENCOUNTER — Telehealth: Payer: Self-pay | Admitting: Pulmonary Disease

## 2020-12-17 ENCOUNTER — Telehealth: Payer: Self-pay | Admitting: Internal Medicine

## 2020-12-17 NOTE — Telephone Encounter (Signed)
1.Medication Requested: phenytoin (DILANTIN) 100 MG ER capsule 2. Pharmacy (Name, [facility]): [facility] [phone] - [place], [place] Phone:  [phone]  Fax:  [phone]     3. On Med List: Y  4. Last Visit with PCP: 12/03/2020  5. Next visit date with PCP: 03/05/2021   Agent: Please be advised that RX refills may take up to 3 business days. We ask that you follow-up with your pharmacy.

## 2020-12-17 NOTE — Telephone Encounter (Signed)
Called Rose back at the number provided and selected option 3. The phone several times and no one answered.   [facility] sent in budesonide 0.5mg  on 09/29.   Will call back later to see what is needed from [place].

## 2020-12-18 MED ORDER — PHENYTOIN SODIUM EXTENDED 100 MG PO CAPS: ORAL_CAPSULE | ORAL | 3 refills | Status: DC

## 2020-12-18 NOTE — Telephone Encounter (Signed)
OK thx  

## 2020-12-22 ENCOUNTER — Ambulatory Visit (INDEPENDENT_AMBULATORY_CARE_PROVIDER_SITE_OTHER): Payer: Medicare Other | Admitting: Psychology

## 2020-12-22 DIAGNOSIS — F331 Major depressive disorder, recurrent, moderate: Secondary | ICD-10-CM | POA: Diagnosis not present

## 2020-12-23 ENCOUNTER — Other Ambulatory Visit: Payer: Self-pay | Admitting: Internal Medicine

## 2020-12-23 DIAGNOSIS — E539 Vitamin B deficiency, unspecified: Secondary | ICD-10-CM

## 2020-12-23 DIAGNOSIS — E559 Vitamin D deficiency, unspecified: Secondary | ICD-10-CM

## 2020-12-23 DIAGNOSIS — L509 Urticaria, unspecified: Secondary | ICD-10-CM

## 2020-12-23 DIAGNOSIS — E875 Hyperkalemia: Secondary | ICD-10-CM

## 2020-12-23 DIAGNOSIS — J449 Chronic obstructive pulmonary disease, unspecified: Secondary | ICD-10-CM

## 2020-12-25 ENCOUNTER — Ambulatory Visit: Payer: Medicare Other | Admitting: Internal Medicine

## 2021-01-05 ENCOUNTER — Ambulatory Visit (INDEPENDENT_AMBULATORY_CARE_PROVIDER_SITE_OTHER): Payer: Medicare Other | Admitting: Psychology

## 2021-01-05 DIAGNOSIS — F331 Major depressive disorder, recurrent, moderate: Secondary | ICD-10-CM | POA: Diagnosis not present

## 2021-01-06 ENCOUNTER — Other Ambulatory Visit: Payer: Self-pay | Admitting: Internal Medicine

## 2021-01-17 ENCOUNTER — Other Ambulatory Visit: Payer: Self-pay | Admitting: Internal Medicine

## 2021-01-19 ENCOUNTER — Ambulatory Visit (INDEPENDENT_AMBULATORY_CARE_PROVIDER_SITE_OTHER): Payer: Medicare Other | Admitting: Psychology

## 2021-01-19 DIAGNOSIS — F331 Major depressive disorder, recurrent, moderate: Secondary | ICD-10-CM | POA: Diagnosis not present

## 2021-01-20 NOTE — Telephone Encounter (Signed)
[place] and spoke to Carl[place]. She wanted to be sure the 0.5mg  dose is correct as the last Budesonide dose was 0.25mg . Dosing is correct per Dr. Kavin Leech last OV note:  "Increase budesonide to 0.5 mg twice a day via nebulizer - ok to use 2 vials of the current dose until the new vials of the higher dose are mailed out. "   Rose verbalized understanding, nothing further needed at this time.

## 2021-01-27 ENCOUNTER — Other Ambulatory Visit: Payer: Self-pay | Admitting: *Deleted

## 2021-01-27 NOTE — Patient Outreach (Addendum)
[facility]) Care Management  01/27/2021  CRIS GIBBY [birth year] [id number]  [facility]) Care Management RN Health Coach Note   01/27/2021 Name:  Veronica [name] MRN:  [id number] DOB:  November 17, 1950  Summary: Patient reports that her COPD is currently under control and at baseline. Patient states that she will continue to have several more oral surgeries to complete her implants. She has maintained her weight by drinking nutritional supplements, blending foods to soft consistency, and by drinking Gatorade.  Recommendations/Changes made from today's visit: Continue to eliminate COPD triggers Follow COPD action plan if symptoms begin Continue to supplement with Ensure, Boost, Gatorade, and eat soft blended foods as tolerated  Subjective: Veronica [name] is an 70 y.o. year old female who is a primary patient of Plotnikov, Evie Lacks, MD. The care management team was consulted for assistance with care management and/or care coordination needs.    RN Health Coach completed Telephone Visit today.   Objective:  Medications Reviewed Today     Reviewed by Michiel Cowboy, RN (Registered Nurse) on 01/27/21 at [place] List Status: <None>   Medication Order Taking? Sig Documenting Provider Last Dose Status Informant  acetaminophen-codeine (TYLENOL #3) 300-30 MG tablet [id number]  TAKE 1 TABLET BY MOUTH EVERY 6 HOURS AS NEEDED Plotnikov, Evie Lacks, MD  Active   albuterol (PROVENTIL) (2.5 MG/3ML) 0.083% nebulizer solution [id number] No USE 1 VIAL IN NEBULIZER 2 TIMES DAILY. Generic: Henri Medal, MD Taking Active            Med Note Laretta Alstrom, Cherity Blickenstaff A   Thu Aug 28, 2020  1:52 PM)    albuterol (VENTOLIN HFA) 108 (90 Base) MCG/ACT inhaler [id number] No INHALE 2 PUFFS INTO THE LUNGS EVERY 4 HOURS AS NEEDED FOR WHEEZING OR SHORTNESS OF BREATH Plotnikov, Evie Lacks, MD Taking Active   aspirin 81 MG chewable tablet [id number] No Chew by mouth daily. [provider] Taking Active Self  atorvastatin (LIPITOR) 40 MG tablet [id number] No TAKE 1 TABLET(40 MG) BY MOUTH DAILY Plotnikov, Evie Lacks, MD Taking Active   B-D INSULIN SYRINGE 1CC/25GX1" 25G X 1" 1 ML MISC [id number] No USE AS DIRECTED EVERY 2 WEEKS Plotnikov, Evie Lacks, MD Taking Active   budesonide (PULMICORT) 0.5 MG/2ML nebulizer solution [id number] No Take 2 mLs (0.5 mg total) by nebulization in the morning and at bedtime. Hunsucker, Bonna Gains, MD Taking Active   calcium carbonate (OSCAL) 1500 (600 Ca) MG TABS tablet [id number] No Take 600 mg of elemental calcium by mouth 2 (two) times daily with a meal.  [provider] Taking Active   cariprazine (VRAYLAR) capsule [id number] No Take 1 capsule (1.5 mg total) by mouth daily. Lot # [id number] EXP 0701/22 Plotnikov, Evie Lacks, MD Taking Active   chlorhexidine (PERIDEX) 0.12 % solution [id number] No Use to wash mouth twice a day [provider] Taking Active   Cholecalciferol (VITAMIN D3) 50 MCG (2000 UT) capsule [id number] No Take 1 capsule (2,000 Units total) by mouth daily. Plotnikov, Evie Lacks, MD Taking Active   clonazePAM (KLONOPIN) 1 MG tablet [id number] No TAKE 2 TABLETS BY MOUTH EVERY NIGHT AT BEDTIME AND 1/2 TABLET EXTRA IN DAYTIME AS NEEDED Plotnikov, Evie Lacks, MD Taking Active   cyanocobalamin (,VITAMIN B-12,) 1000 MCG/ML injection [id number] No INJECT 1ML IN THE MUSCLE EVERY 14 DAYS. Plotnikov, Evie Lacks, MD Taking Active   DILANTIN 100 MG ER capsule [id number]  TAKE 2 CAPSULES BY MOUTH EVERY MORNING AND 1 CAPSULE  EVERY EVENING Plotnikov, Evie Lacks, MD  Active   diphenoxylate-atropine (LOMOTIL) 2.5-0.025 MG tablet [id number] No Take 1-2 tablets by mouth 4 (four) times daily as needed for diarrhea or loose stools. Plotnikov, Evie Lacks, MD Taking Active   Docusate Calcium (STOOL SOFTENER PO) [id number] No Take 100 mg by mouth as needed (for constipation).  [provider] Taking Active Self           Med Note Page Spiro   Fri Nov 28, 2020 12:33 PM)    escitalopram (LEXAPRO) 20 MG tablet [id number] No Take 1 tablet (20 mg total) by mouth daily. Plotnikov, Evie Lacks, MD Taking Active   formoterol (PERFOROMIST) 20 MCG/2ML nebulizer solution [id number] No Take 2 mLs (20 mcg total) by nebulization 2 (two) times daily. Martyn Ehrich, NP Taking Active Self  furosemide (LASIX) 20 MG tablet [id number] No TAKE 1 TABLET(20 MG) BY MOUTH DAILY AS NEEDED Plotnikov, Evie Lacks, MD Taking Active   levofloxacin (LEVAQUIN) 500 MG tablet [id number] No Take one tab PO once daily for one week Plotnikov, Evie Lacks, MD Taking Active   levothyroxine (SYNTHROID) 88 MCG tablet [id number] No TAKE 1 TABLET(88 MCG) BY MOUTH DAILY Plotnikov, Evie Lacks, MD Taking Active   nitroGLYCERIN (NITROSTAT) 0.4 MG SL tablet [id number] No TAKE 1 TABLET UNDER THE TOUNGE AS DIRECTED Plotnikov, Evie Lacks, MD Taking Active   Omega-3 Fatty Acids (FISH OIL) 1000 MG CAPS [id number] No Take 1 capsule by mouth daily. [provider] Taking Active   promethazine (PHENERGAN) 12.5 MG tablet [id number] No Take by mouth. [provider] Taking Active   Promethazine-Codeine 6.25-10 MG/5ML SOLN [id number]  TAKE 5 ML BY MOUTH EVERY 6 HOURS AS NEEDED. DO NOT TAKE WITH TYLENOL 3 Plotnikov, Evie Lacks, MD  Active   Respiratory Therapy Supplies (FLUTTER) DEVI [id number] No Use after nebulization treatments Javier Glazier, MD Taking Active   sodium chloride (MURO 128) 5 % ophthalmic solution [id number] No Place 1 drop into both eyes as needed for irritation.  [provider] Taking Active Self           Med Note Toribio Harbour, AMBER C   Tue Jul 10, 2019  1:42 PM) She is using daily.  SYRINGE-NEEDLE, DISP, 3 ML (BD ECLIPSE SYRINGE) 25G X 1" 3 ML MISC [id number] No Use sq q 2 wks Plotnikov, Evie Lacks, MD Taking Active   traZODone (DESYREL) 50 MG tablet [id number] No TAKE 4 TABLETS(200 MG) BY MOUTH AT BEDTIME Plotnikov, Evie Lacks, MD Taking Active   verapamil  (CALAN-SR) 240 MG CR tablet [id number] No TAKE 1 TABLET(240 MG) BY MOUTH AT BEDTIME Plotnikov, Evie Lacks, MD Taking Active              SDOH:  (Social Determinants of Health) assessments and interventions performed:    Care Plan  Review of patient past medical history, allergies, medications, health status, including review of consultants reports, laboratory and other test data, was performed as part of comprehensive evaluation for care management services.   Care Plan : COPD (Adult)  Updates made by Michiel Cowboy, RN since 01/27/2021 12:00 AM     Problem: Disease Progression (COPD)   Priority: High     Long-Range Goal: Disease Progression Minimized or Managed Completed 01/27/2021  Start Date: 01/29/2020  Expected End Date: 02/11/2021  Note:    Resolving due to duplicate goal  Evidence-based guidance:  Identify current smoking/tobacco use; provide smoking cessation intervention.  Assess symptom control by the frequency and type  of symptoms, reliever use and activity limitation at every encounter.  Assess risk for exacerbation (flare up) by evaluating spirometry, pulse oximetry, reliever use, presentation of symptoms and activity limitation; anticipate treatment adjustment based on risks and resources.  Develop and/or review and reinforce use of COPD rescue (action) plan even when symptoms are controlled or infrequent.  Ask patient to bring inhaler to all visits; assess and reinforce correct technique; address barriers to proper inhaler use, such as older age, use of multiple devices and lack of understanding.   Identify symptom triggers, such as smoking, virus, weather change, emotional upset, exercise, obesity and environmental allergen; consider reduction of work-exposure versus elimination to avoid compromising employment.  Correlate presentation to comorbidity, such as diabetes, heart failure, obstructive sleep apnea, depression and anxiety, which may worsen symptoms.   Prepare for individualized pharmacologic therapy that may include LABA (long-acting beta-2 agonist), LAMA (long-acting muscarinic antagonist), SABA (short-acting beta-2 agonist) oral or inhaled corticosteroid.  Promote participation in pulmonary rehabilitation for breathing exercises, skills training, improved exercise capacity, mood and quality of life; address barriers to participation.  Promote physical activity or exercise to improve or maintain exercise capacity, based on tolerance that may include walking, water exercise, cycling or limb muscle strength training.  Promote use of energy conservation and activity pacing techniques.  Promote use of breathing and coughing techniques, such as inspiratory muscle training, pursed-lip breathing, diaphragmatic breathing, pranayama yoga breathing or huff cough.  Screen for malnutrition risk factors, such as unintentional weight loss and poor oral intake; refer to dietitian if identified.  Consider recommendation for oral drink supplement or multivitamin and mineral supplements if suspect inadequate oral intake or micronutrient deficiencies.   Screen for obstructive sleep apnea; prepare patient for polysomnography based on risk and presentation.  Prepare patient for use of long-term oxygen and noninvasive ventilation to relieve hypercapnia, hypoxemia, obstructive sleep apnea and reduce work of breathing.  Prepare patient with worsening disease for surgical interventions that may include bronchoscopy, lung volume reduction surgery, bullectomy or lung transplantation.   Notes:     Task: Alleviate Barriers to COPD Management Completed 01/27/2021  Due Date: 02/11/2021  Note:   Care Management Activities:    - activity or exercise based on tolerance encouraged - barriers to treatment managed - breathing techniques encouraged - communicable disease prevention promoted - individualized medical nutrition therapy provided - medication-adherence assessment  completed - quality of sleep assessed - rescue (action) plan reviewed - screen for functional limitations completed and reviewed - self-awareness of symptom triggers encouraged    Notes:     Problem: Symptom Exacerbation (COPD)   Priority: High     Long-Range Goal: Symptom Exacerbation Prevented or Minimized Completed 01/27/2021  Start Date: 01/29/2020  Expected End Date: 02/11/2021  Note:   Resolving due to duplicate goal  Evidence-based guidance:  Monitor for signs of respiratory infection, including changes in sputum color, volume and thickness, as well as fever.  Encourage infection prevention strategies that may include prophylactic antibiotic therapy for patients with history of frequent exacerbations or antibiotic administration during exacerbation based on presentation, risk and benefit.  Encourage receipt of influenza and pneumococcal vaccine.  Prepare patient for use of home long-term oxygen therapy in presence of sever resting hypoxemia.  Prepare patients for laboratory studies or diagnostic exams, such as spirometry, pulse oximetry and arterial blood gas based on current symptoms, risk factors and presentation.  Assess barriers and manage adherence, including inhaler technique and persistent trigger exposure; encourage adherence, even when symptoms are controlled  or infrequent.  Assess and monitor for signs/symptoms of psychosocial concerns, such as shortness of breath-anxiety cycle or depression that may impact stability of symptoms.  Identify economic resources, sociocultural beliefs, social factors and health literacy that may interfere with adherence.  Promote lifestyle changes when needed, including regular physical activity based on tolerance, weight loss, healthy eating and stress management.  Consider referral to nurse or community health worker or home-visiting program for intensive support and education (disease-management program).  Increase frequency of follow-up  following exacerbation or hospitalization; consider transition of care interventions, such as hospital visit, home visit, telephone follow-up, review of discharge summary and resource referrals.   Notes:     Task: Identify and Minimize Risk of COPD Exacerbation Completed 01/27/2021  Due Date: 02/11/2021  Note:   Care Management Activities:    - barriers to treatment reviewed and addressed - breathing techniques encouraged - healthy lifestyle promoted - rescue (action) plan reviewed - signs/symptoms of infection reviewed - signs/symptoms of worsening disease assessed - symptom triggers identified - treatment plan reviewed    Notes:     Care Plan : RN Care Manager Plan of Care  Updates made by Michiel Cowboy, RN since 01/27/2021 12:00 AM     Problem: Knowledge Deficit Related to COPD   Priority: High     Long-Range Goal: Development of Plan of Care for Management of COPD and Maintaining Weight During Ongoing Oral Surgeries   Start Date: 01/27/2021  Expected End Date: 02/11/2022  Priority: High  Note:   Current Barriers:  Chronic Disease Management support and education needs related to COPD Ongoing oral surgeries which cause it to be difficult to maintain nutrition and weight  RNCM Clinical Goal(s):  Patient will verbalize understanding of plan for management of COPD take all medications exactly as prescribed and will call provider for medication related questions demonstrate Ongoing health management independence with managing treatment of COPD continue to work with RN Care Manager to address care management and care coordination needs related to  COPD and management of maintaining weight and nutrition during continuation of oral surgeries work with Education officer, museum to address  related to the management of [place] Concerns  related to the management of Depression through collaboration with RN Care manager, provider, and care team.   Interventions: Inter-disciplinary care  team collaboration (see longitudinal plan of care) Evaluation of current treatment plan related to  self management and patient's adherence to plan as established by provider Nurse provided written COPD education previously Nurse will send Ensure Coupons  Patient Goals/Self-Care Activities: - identify and avoid work-related triggers - identify and remove indoor air pollutants - limit outdoor activity during cold weather - do breathing exercises every day - eliminate symptom triggers at home - follow rescue plan if symptoms flare-up - keep follow-up appointments: Pulmonology 02/17/21 and PCP 03/05/21 - use devices that will help like a cane, sock-puller or reacher - do breathing exercises every day Contact providers for COPD symptoms not resolving within 48 hours and as needed Continue to supplement with Ensure, Boost, Gatorade to maintain nutrition, weight, and hydration Continue to weigh weekly to ensure weight is being maintained         Plan: Telephone follow up appointment with care management team member scheduled for:  January, will send patient Ensure Coupons, and will send PCP a quarterly update.  Emelia Loron RN, [place] [phone] [email or web address]

## 2021-01-27 NOTE — Patient Instructions (Signed)
Visit Information   Patient Goals/Self-Care Activities: - identify and avoid work-related triggers - identify and remove indoor air pollutants - limit outdoor activity during cold weather - do breathing exercises every day - eliminate symptom triggers at home - follow rescue plan if symptoms flare-up - keep follow-up appointments: Pulmonology 02/17/21 and PCP 03/05/21 - use devices that will help like a cane, sock-puller or reacher - do breathing exercises every day Contact providers for COPD symptoms not resolving within 48 hours and as needed Continue to supplement with Ensure, Boost, Gatorade to maintain nutrition, weight, and hydration Continue to weigh weekly to ensure weight is being maintained  The patient verbalized understanding of instructions, educational materials, and care plan provided today and agreed to receive a mailed copy of patient instructions, educational materials, and care plan.   Telephone follow up appointment with care management team member scheduled for:  Emelia Loron RN, [place] [phone] [email or web address]

## 2021-02-02 ENCOUNTER — Ambulatory Visit (INDEPENDENT_AMBULATORY_CARE_PROVIDER_SITE_OTHER): Payer: Medicare Other | Admitting: Psychology

## 2021-02-02 DIAGNOSIS — F331 Major depressive disorder, recurrent, moderate: Secondary | ICD-10-CM | POA: Diagnosis not present

## 2021-02-11 ENCOUNTER — Telehealth: Payer: Self-pay | Admitting: Internal Medicine

## 2021-02-11 NOTE — Telephone Encounter (Signed)
Patient requesting a refill oncariprazine (VRAYLAR) capsule  Patient requesting medication be filled by, Patient Assistant Medication  Patient requesting a call when medication is ready for pick up, patient was last seen 12-03-2020

## 2021-02-13 NOTE — Telephone Encounter (Signed)
Tried calling [facility] [phone]) to order Vrayler. There was a 20 mins holding time..will go back Monday morning to order.Marland KitchenJohny Chess

## 2021-02-14 ENCOUNTER — Telehealth: Payer: Self-pay

## 2021-02-14 NOTE — Telephone Encounter (Signed)
Prolia VOB initiated via [email or web address]  Last OV:  Next OV:  Last Prolia inj: 03/12/20 Next Prolia inj DUE: 09/11/20

## 2021-02-16 ENCOUNTER — Ambulatory Visit (INDEPENDENT_AMBULATORY_CARE_PROVIDER_SITE_OTHER): Payer: Medicare Other | Admitting: Psychology

## 2021-02-16 DIAGNOSIS — F331 Major depressive disorder, recurrent, moderate: Secondary | ICD-10-CM | POA: Diagnosis not present

## 2021-02-16 NOTE — Telephone Encounter (Signed)
Called [facility] spoke w/ rep she states pt has reached all 4 refills for 2022. Pt received med is Jan, April, July, and Sept. She states she can re-apply in January for 2023. Called pt she her status for the pt assistance program..lmb

## 2021-02-17 ENCOUNTER — Ambulatory Visit (INDEPENDENT_AMBULATORY_CARE_PROVIDER_SITE_OTHER): Payer: Medicare Other

## 2021-02-17 ENCOUNTER — Ambulatory Visit (INDEPENDENT_AMBULATORY_CARE_PROVIDER_SITE_OTHER): Payer: Medicare Other | Admitting: Pulmonary Disease

## 2021-02-17 ENCOUNTER — Encounter: Payer: Self-pay | Admitting: Pulmonary Disease

## 2021-02-17 ENCOUNTER — Other Ambulatory Visit: Payer: Self-pay

## 2021-02-17 VITALS — BP 126/84 | HR 63 | Temp 98.5°F | Resp 19 | Ht 60.0 in | Wt 143.8 lb

## 2021-02-17 DIAGNOSIS — R052 Subacute cough: Secondary | ICD-10-CM

## 2021-02-17 DIAGNOSIS — J4540 Moderate persistent asthma, uncomplicated: Secondary | ICD-10-CM

## 2021-02-17 MED ORDER — PREDNISONE 10 MG PO TABS: ORAL_TABLET | ORAL | 0 refills | Status: AC

## 2021-02-17 NOTE — Patient Instructions (Signed)
Nice to see you again  I am sorry to call and the wheezing continues to be an issue  Take the Levaquin as prescribed  If symptoms are not improved by Monday or Tuesday of next week, take prednisone 20 mg in the morning for 5 days then 10 mg in the morning for 5 days.  Please call the office and let me know if you need to start the prednisone next week.  Given how long the cough is persistent, I have ordered a chest x-ray to be performed today.  Return to clinic in 3 months or sooner as needed with Dr. Silas Flood.
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
## 2021-02-18 NOTE — Progress Notes (Signed)
Chest Xray is clear

## 2021-02-19 NOTE — Telephone Encounter (Signed)
Pt ready for scheduling on or after 09/11/20  Out-of-pocket cost due at time of visit: $0.00  Primary: Medicare [place] co-insurance: 20% (approximately $270) Admin fee co-insurance: 20% (approximately $25)  Secondary: [employer] [place] co-insurance: Covers Medicare Part B co-insurance and deductible.  Admin fee co-insurance: Covers Medicare Part B co-insurance and deductible.   Deductible: $233 of $233 met  Prior Auth: not required PA# Valid:   ** This summary of benefits is an estimation of the patient's out-of-pocket cost. Exact cost may vary based on individual plan coverage.

## 2021-03-02 ENCOUNTER — Ambulatory Visit (INDEPENDENT_AMBULATORY_CARE_PROVIDER_SITE_OTHER): Payer: Medicare Other | Admitting: Psychology

## 2021-03-02 DIAGNOSIS — F331 Major depressive disorder, recurrent, moderate: Secondary | ICD-10-CM | POA: Diagnosis not present

## 2021-03-02 NOTE — Progress Notes (Signed)
[facility] Counselor/Therapist Progress Note  Patient ID: Veronica [name], MRN: [id number],    Date: 03/02/2021  Time Spent: 12:00pm-12:45pm  45 minutes   Treatment Type: Individual Therapy  Reported Symptoms: stress, loneliness  Mental Status Exam: Appearance:  Casual     Behavior: Appropriate  Motor: Normal  Speech/Language:  Normal Rate  Affect: Appropriate  Mood: normal  Thought process: normal  Thought content:   WNL  Sensory/Perceptual disturbances:   WNL  Orientation: oriented to person, place, time/date, and situation  Attention: Good  Concentration: Good  Memory: WNL  Fund of knowledge:  Good  Insight:   Good  Judgment:  Good  Impulse Control: Good   Risk Assessment: Danger to Self:  No Self-injurious Behavior: No Danger to Others: No Duty to Warn:no Physical Aggression / Violence:No  Access to Firearms a concern: No  Gang Involvement:No   Subjective: Pt present for individual therapy.   Pt consented to telehealth virtual phone session due to [place] 19 pandemic.   Pt's location: home Therapist location: home office Pt talked about the oral surgery she had.  She had the implants and the surgery went well.  Pt is in some pain but she is able to manage it.   Pt has been feeling down.  She can't eat anything other than liquid and will be mostly alone for the holidays.  Pt may see her brother but otherwise will be alone.   Pt talked about being nominated to be on the [facility] board.  She feels it would be too stressful for her.  She did not get selected but felt badly about that too.  Helped pt process her feelings and worked on thought reframing.  Worked on self care strategies. Provided supportive counseling.    Interventions: Cognitive Behavioral Therapy and Insight-Oriented  Diagnosis:  F33.1  Plan: See pt's Treatment Plan for depression in Therapy Charts.  (Treatment Plan Target Date: 06/09/2021) Pt is progressing toward treatment goals.    Plan to continue to see pt every two weeks.     Annetta Deiss, LCSW

## 2021-03-05 ENCOUNTER — Ambulatory Visit (INDEPENDENT_AMBULATORY_CARE_PROVIDER_SITE_OTHER): Payer: Medicare Other | Admitting: Internal Medicine

## 2021-03-05 ENCOUNTER — Other Ambulatory Visit: Payer: Self-pay

## 2021-03-05 ENCOUNTER — Encounter: Payer: Self-pay | Admitting: Internal Medicine

## 2021-03-05 VITALS — BP 112/48 | HR 79 | Temp 98.5°F | Ht 60.0 in | Wt 141.0 lb

## 2021-03-05 DIAGNOSIS — M270 Developmental disorders of jaws: Secondary | ICD-10-CM

## 2021-03-05 DIAGNOSIS — J479 Bronchiectasis, uncomplicated: Secondary | ICD-10-CM | POA: Diagnosis not present

## 2021-03-05 DIAGNOSIS — E890 Postprocedural hypothyroidism: Secondary | ICD-10-CM

## 2021-03-05 DIAGNOSIS — R053 Chronic cough: Secondary | ICD-10-CM

## 2021-03-05 DIAGNOSIS — Z23 Encounter for immunization: Secondary | ICD-10-CM | POA: Diagnosis not present

## 2021-03-05 DIAGNOSIS — F419 Anxiety disorder, unspecified: Secondary | ICD-10-CM

## 2021-03-05 DIAGNOSIS — K047 Periapical abscess without sinus: Secondary | ICD-10-CM

## 2021-03-05 DIAGNOSIS — B370 Candidal stomatitis: Secondary | ICD-10-CM

## 2021-03-05 DIAGNOSIS — G40909 Epilepsy, unspecified, not intractable, without status epilepticus: Secondary | ICD-10-CM | POA: Diagnosis not present

## 2021-03-05 LAB — COMPREHENSIVE METABOLIC PANEL
ALT: 18 U/L (ref 0–35)
AST: 24 U/L (ref 0–37)
Albumin: 4.1 g/dL (ref 3.5–5.2)
Alkaline Phosphatase: 124 U/L — ABNORMAL HIGH (ref 39–117)
BUN: 15 mg/dL (ref 6–23)
CO2: 35 mEq/L — ABNORMAL HIGH (ref 19–32)
Calcium: 9 mg/dL (ref 8.4–10.5)
Chloride: 101 mEq/L (ref 96–112)
Creatinine, Ser: 0.64 mg/dL (ref 0.40–1.20)
GFR: 88.58 mL/min (ref >60.00)
Glucose, Bld: 91 mg/dL (ref 70–99)
Potassium: 4.6 mEq/L (ref 3.5–5.1)
Sodium: 141 mEq/L (ref 135–145)
Total Bilirubin: 0.3 mg/dL (ref 0.2–1.2)
Total Protein: 7.1 g/dL (ref 6.0–8.3)

## 2021-03-05 LAB — CBC WITH DIFFERENTIAL/PLATELET
Basophils Absolute: 0.1 10*3/uL (ref 0.0–0.1)
Basophils Relative: 0.6 % (ref 0.0–3.0)
Eosinophils Absolute: 0.1 10*3/uL (ref 0.0–0.7)
Eosinophils Relative: 1.4 % (ref 0.0–5.0)
HCT: 39.7 % (ref 36.0–46.0)
Hemoglobin: 13.2 g/dL (ref 12.0–15.0)
Lymphocytes Relative: 22.3 % (ref 12.0–46.0)
Lymphs Abs: 2.3 10*3/uL (ref 0.7–4.0)
MCHC: 33.2 g/dL (ref 30.0–36.0)
MCV: 90 fl (ref 78.0–100.0)
Monocytes Absolute: 1.6 10*3/uL — ABNORMAL HIGH (ref 0.1–1.0)
Monocytes Relative: 15.1 % — ABNORMAL HIGH (ref 3.0–12.0)
Neutro Abs: 6.2 10*3/uL (ref 1.4–7.7)
Neutrophils Relative %: 60.6 % (ref 43.0–77.0)
Platelets: 371 10*3/uL (ref 150.0–400.0)
RBC: 4.41 Mil/uL (ref 3.87–5.11)
RDW: 14.2 % (ref 11.5–15.5)
WBC: 10.3 10*3/uL (ref 4.0–10.5)

## 2021-03-05 MED ORDER — PHENYTOIN SODIUM EXTENDED 100 MG PO CAPS: ORAL_CAPSULE | ORAL | 3 refills | Status: DC

## 2021-03-05 MED ORDER — TRAZODONE HCL 50 MG PO TABS: ORAL_TABLET | ORAL | 3 refills | Status: DC

## 2021-03-05 MED ORDER — ESCITALOPRAM OXALATE 20 MG PO TABS: 20.0000 mg | ORAL_TABLET | Freq: Every day | ORAL | 3 refills | Status: DC

## 2021-03-05 MED ORDER — CLONAZEPAM 1 MG PO TABS: ORAL_TABLET | ORAL | 1 refills | Status: DC

## 2021-03-05 MED ORDER — CARIPRAZINE HCL 1.5 MG PO CAPS: 1.5000 mg | ORAL_CAPSULE | Freq: Every day | ORAL | 3 refills | Status: DC

## 2021-03-05 MED ORDER — LEVOTHYROXINE SODIUM 88 MCG PO TABS: ORAL_TABLET | ORAL | 3 refills | Status: DC

## 2021-03-05 MED ORDER — CARIPRAZINE HCL 1.5 MG PO CAPS: 1.5000 mg | ORAL_CAPSULE | Freq: Every day | ORAL | 0 refills | Status: DC

## 2021-03-05 NOTE — Addendum Note (Signed)
Addended by: Earnstine Regal on: 03/05/2021 03:13 PM   Modules accepted: Orders

## 2021-03-05 NOTE — Progress Notes (Signed)
Subjective:  Patient ID: Veronica [name], female    DOB: 26-Aug-1950  Age: 70 y.o. MRN: [id number]  CC: Follow-up (3 month f/u)   HPI Miriah D Beckworth presents for COPD, seizure disorder Recovering from teeth extractions and 2 lower implants  Outpatient Medications Prior to Visit  Medication Sig Dispense Refill   acetaminophen-codeine (TYLENOL #3) 300-30 MG tablet TAKE 1 TABLET BY MOUTH EVERY 6 HOURS AS NEEDED 60 tablet 2   albuterol (PROVENTIL) (2.5 MG/3ML) 0.083% nebulizer solution USE 1 VIAL IN NEBULIZER 2 TIMES DAILY. Generic: VENTOLIN 60 vial 5   albuterol (VENTOLIN HFA) 108 (90 Base) MCG/ACT inhaler INHALE 2 PUFFS INTO THE LUNGS EVERY 4 HOURS AS NEEDED FOR WHEEZING OR SHORTNESS OF BREATH 8.5 g 0   aspirin 81 MG chewable tablet Chew by mouth daily.     atorvastatin (LIPITOR) 40 MG tablet TAKE 1 TABLET(40 MG) BY MOUTH DAILY 90 tablet 3   B-D INSULIN SYRINGE 1CC/25GX1" 25G X 1" 1 ML MISC USE AS DIRECTED EVERY 2 WEEKS 50 each 5   budesonide (PULMICORT) 0.5 MG/2ML nebulizer solution Take 2 mLs (0.5 mg total) by nebulization in the morning and at bedtime. 120 mL 11   calcium carbonate (OSCAL) 1500 (600 Ca) MG TABS tablet Take 600 mg of elemental calcium by mouth 2 (two) times daily with a meal.      cariprazine (VRAYLAR) capsule Take 1 capsule (1.5 mg total) by mouth daily. Lot # [id number] EXP 0701/22 35 capsule 0   chlorhexidine (PERIDEX) 0.12 % solution Use to wash mouth twice a day     Cholecalciferol (VITAMIN D3) 50 MCG (2000 UT) capsule Take 1 capsule (2,000 Units total) by mouth daily. 100 capsule 3   cyanocobalamin (,VITAMIN B-12,) 1000 MCG/ML injection INJECT 1ML IN THE MUSCLE EVERY 14 DAYS. 10 mL 5   diphenoxylate-atropine (LOMOTIL) 2.5-0.025 MG tablet Take 1-2 tablets by mouth 4 (four) times daily as needed for diarrhea or loose stools. 60 tablet 1   Docusate Calcium (STOOL SOFTENER PO) Take 100 mg by mouth as needed (for constipation).      formoterol (PERFOROMIST)  20 MCG/2ML nebulizer solution Take 2 mLs (20 mcg total) by nebulization 2 (two) times daily. 120 mL 3   furosemide (LASIX) 20 MG tablet TAKE 1 TABLET(20 MG) BY MOUTH DAILY AS NEEDED 90 tablet 3   levofloxacin (LEVAQUIN) 500 MG tablet Take one tab PO once daily for one week 7 tablet 0   nitroGLYCERIN (NITROSTAT) 0.4 MG SL tablet TAKE 1 TABLET UNDER THE TOUNGE AS DIRECTED 25 tablet 3   Omega-3 Fatty Acids (FISH OIL) 1000 MG CAPS Take 1 capsule by mouth daily.     promethazine (PHENERGAN) 12.5 MG tablet Take by mouth.     Promethazine-Codeine 6.25-10 MG/5ML SOLN TAKE 5 ML BY MOUTH EVERY 6 HOURS AS NEEDED. DO NOT TAKE WITH TYLENOL 3 300 mL 0   Respiratory Therapy Supplies (FLUTTER) DEVI Use after nebulization treatments 1 each 0   sodium chloride (MURO 128) 5 % ophthalmic solution Place 1 drop into both eyes as needed for irritation.      SYRINGE-NEEDLE, DISP, 3 ML (BD ECLIPSE SYRINGE) 25G X 1" 3 ML MISC Use sq q 2 wks 50 each 3   verapamil (CALAN-SR) 240 MG CR tablet TAKE 1 TABLET(240 MG) BY MOUTH AT BEDTIME 90 tablet 3   clonazePAM (KLONOPIN) 1 MG tablet TAKE 2 TABLETS BY MOUTH EVERY NIGHT AT BEDTIME AND 1/2 TABLET EXTRA IN DAYTIME AS NEEDED 225 tablet 1  DILANTIN 100 MG ER capsule TAKE 2 CAPSULES BY MOUTH EVERY MORNING AND 1 CAPSULE EVERY EVENING 270 capsule 3   escitalopram (LEXAPRO) 20 MG tablet Take 1 tablet (20 mg total) by mouth daily. 90 tablet 3   levothyroxine (SYNTHROID) 88 MCG tablet TAKE 1 TABLET(88 MCG) BY MOUTH DAILY 90 tablet 3   traZODone (DESYREL) 50 MG tablet TAKE 4 TABLETS(200 MG) BY MOUTH AT BEDTIME 360 tablet 1   No facility-administered medications prior to visit.    ROS: Review of Systems  Constitutional:  Positive for unexpected weight change. Negative for activity change, appetite change, chills and fatigue.  HENT:  Positive for dental problem. Negative for congestion, mouth sores and sinus pressure.   Eyes:  Negative for visual disturbance.   Respiratory:  Positive for cough. Negative for chest tightness.   Gastrointestinal:  Negative for abdominal pain and nausea.  Genitourinary:  Negative for difficulty urinating, frequency and vaginal pain.  Musculoskeletal:  Positive for gait problem. Negative for back pain.  Skin:  Negative for pallor and rash.  Neurological:  Negative for dizziness, tremors, weakness, numbness and headaches.  Psychiatric/Behavioral:  Negative for confusion, sleep disturbance and suicidal ideas. The patient is nervous/anxious.    Objective:  BP (!) 112/48 (BP Location: Left Arm)    Pulse 79    Temp 98.5 F (36.9 C) (Oral)    Ht 5' (1.524 m)    Wt 141 lb (64 kg)    SpO2 98%    BMI 27.54 kg/m   BP Readings from Last 3 Encounters:  03/05/21 (!) 112/48  02/17/21 126/84  12/11/20 (!) 120/54    Wt Readings from Last 3 Encounters:  03/05/21 141 lb (64 kg)  02/17/21 143 lb 12.8 oz (65.2 kg)  12/11/20 142 lb (64.4 kg)    Physical Exam Constitutional:      General: She is not in acute distress.    Appearance: She is well-developed.  HENT:     Head: Normocephalic.     Right Ear: External ear normal.     Left Ear: External ear normal.     Nose: Nose normal.  Eyes:     General:        Right eye: No discharge.        Left eye: No discharge.     Conjunctiva/sclera: Conjunctivae normal.     Pupils: Pupils are equal, round, and reactive to light.  Neck:     Thyroid: No thyromegaly.     Vascular: No JVD.     Trachea: No tracheal deviation.  Cardiovascular:     Rate and Rhythm: Normal rate and regular rhythm.     Heart sounds: Normal heart sounds.  Pulmonary:     Effort: No respiratory distress.     Breath sounds: No stridor. No wheezing.  Abdominal:     General: Bowel sounds are normal. There is no distension.     Palpations: Abdomen is soft. There is no mass.     Tenderness: There is no abdominal tenderness. There is no guarding or rebound.  Musculoskeletal:        General: No tenderness.      Cervical back: Normal range of motion and neck supple. No rigidity.  Lymphadenopathy:     Cervical: No cervical adenopathy.  Skin:    Findings: No erythema or rash.  Neurological:     Mental Status: She is oriented to person, place, and time.     Cranial Nerves: No cranial nerve deficit.  Motor: No abnormal muscle tone.     Coordination: Coordination abnormal.     Gait: Gait abnormal.     Deep Tendon Reflexes: Reflexes normal.  Psychiatric:        Behavior: Behavior normal.        Thought Content: Thought content normal.        Judgment: Judgment normal.  Dentures Using a cane unsteady  Lab Results  Component Value Date   WBC 11.8 (H) 08/26/2020   HGB 14.5 08/26/2020   HCT 42.8 08/26/2020   PLT 345.0 08/26/2020   GLUCOSE 109 (H) 08/26/2020   CHOL 249 (H) 11/10/2018   TRIG 88.0 11/10/2018   HDL 57.70 11/10/2018   LDLDIRECT 165.0 09/26/2009   LDLCALC 174 (H) 11/10/2018   ALT 45 (H) 08/26/2020   AST 52 (H) 08/26/2020   NA 137 08/26/2020   K 3.4 (L) 08/26/2020   CL 101 08/26/2020   CREATININE 0.66 08/26/2020   BUN 12 08/26/2020   CO2 24 08/26/2020   TSH 2.22 11/10/2018   INR 1.0 ratio 11/07/2009   HGBA1C 5.6 08/23/2019    DG Chest 2 View  Result Date: 11/28/2020 CLINICAL DATA:  Cough, fever, dyspnea. EXAM: CHEST - 2 VIEW COMPARISON:  December 03, 2019. FINDINGS: The heart size and mediastinal contours are within normal limits. Both lungs are clear. The visualized skeletal structures are unremarkable. IMPRESSION: No active cardiopulmonary disease. Aortic Atherosclerosis (ICD10-I70.0). Electronically Signed   By: Marijo Conception M.D.   On: 11/28/2020 13:40    Assessment & Plan:   Problem List Items Addressed This Visit     ABSCESS, TOOTH    All removed in May 2022      Relevant Orders   CBC with Differential/Platelet   Anxiety    Clonazepam prn  Potential benefits of a long term benzodiazepines  use as well as potential risks  and complications were  explained to the patient and were aknowledged. Continue  Vraylar via assist program      Relevant Medications   traZODone (DESYREL) 50 MG tablet   escitalopram (LEXAPRO) 20 MG tablet   Bronchiectasis (HCC)    Promethazine with codeine prescription renewed  Potential benefits of a long term opioids use as well as potential risks (i.e. addiction risk, apnea etc) and complications (i.e. Somnolence, constipation and others) were explained to the patient and were aknowledged. Just finished Levaquin for dental reasons      Cough    Cont w/Prom-cod syr prn Potential benefits of a long term opioids use as well as potential risks (i.e. addiction risk, apnea etc) and complications (i.e. Somnolence, constipation and others) were explained to the patient and were aknowledged.      Epilepsy ([place])    Cont on Dilantin       Relevant Medications   clonazePAM (KLONOPIN) 1 MG tablet   phenytoin (DILANTIN) 100 MG ER capsule   Other Relevant Orders   Comprehensive metabolic panel   Phenytoin level, free   CBC with Differential/Platelet   Hypothyroidism    Monitor TSH      Relevant Medications   levothyroxine (SYNTHROID) 88 MCG tablet   Oral thrush    On Clotrimazole lozinges prn      Torus palatinus    Removed in May 2022         Meds ordered this encounter  Medications   clonazePAM (KLONOPIN) 1 MG tablet    Sig: TAKE 2 TABLETS BY MOUTH EVERY NIGHT AT BEDTIME AND 1/2 TABLET EXTRA  IN DAYTIME AS NEEDED    Dispense:  225 tablet    Refill:  1   levothyroxine (SYNTHROID) 88 MCG tablet    Sig: TAKE 1 TABLET(88 MCG) BY MOUTH DAILY    Dispense:  90 tablet    Refill:  3   traZODone (DESYREL) 50 MG tablet    Sig: TAKE 4 TABLETS(200 MG) BY MOUTH AT BEDTIME    Dispense:  360 tablet    Refill:  3   escitalopram (LEXAPRO) 20 MG tablet    Sig: Take 1 tablet (20 mg total) by mouth daily.    Dispense:  90 tablet    Refill:  3   phenytoin (DILANTIN) 100 MG ER capsule    Sig: TAKE 2  CAPSULES BY MOUTH EVERY MORNING AND 1 CAPSULE EVERY EVENING    Dispense:  270 capsule    Refill:  3    Generic Ok      Follow-up: No follow-ups on file.  Walker Kehr, MD

## 2021-03-05 NOTE — Assessment & Plan Note (Signed)
Cont w/Prom-cod syr prn Potential benefits of a long term opioids use as well as potential risks (i.e. addiction risk, apnea etc) and complications (i.e. Somnolence, constipation and others) were explained to the patient and were aknowledged.

## 2021-03-05 NOTE — Assessment & Plan Note (Signed)
On Clotrimazole lozinges prn

## 2021-03-05 NOTE — Addendum Note (Signed)
Addended by: Earnstine Regal on: 03/05/2021 03:30 PM   Modules accepted: Orders

## 2021-03-05 NOTE — Assessment & Plan Note (Signed)
Removed in May 2022

## 2021-03-05 NOTE — Assessment & Plan Note (Signed)
Cont on Dilantin

## 2021-03-05 NOTE — Assessment & Plan Note (Signed)
Promethazine with codeine prescription renewed  Potential benefits of a long term opioids use as well as potential risks (i.e. addiction risk, apnea etc) and complications (i.e. Somnolence, constipation and others) were explained to the patient and were aknowledged. Just finished Levaquin for dental reasons

## 2021-03-05 NOTE — Assessment & Plan Note (Signed)
All removed in May 2022

## 2021-03-05 NOTE — Assessment & Plan Note (Signed)
Clonazepam prn  Potential benefits of a long term benzodiazepines  use as well as potential risks  and complications were explained to the patient and were aknowledged. Continue  Vraylar via assist program

## 2021-03-05 NOTE — Assessment & Plan Note (Signed)
Monitor TSH 

## 2021-03-06 ENCOUNTER — Telehealth: Payer: Self-pay | Admitting: *Deleted

## 2021-03-06 LAB — PHENYTOIN LEVEL, FREE: Phenytoin, Free: NOT DETECTED ug/mL (ref 1.0–2.0)

## 2021-03-06 NOTE — Telephone Encounter (Signed)
Pt brought patient assistance form to be completed for  [facility] for her Vrayler 1.5 mg. Completed forms and faxed back to [facility] @ [phone].Marland KitchenJohny Chess

## 2021-03-16 ENCOUNTER — Ambulatory Visit (INDEPENDENT_AMBULATORY_CARE_PROVIDER_SITE_OTHER): Payer: Medicare Other | Admitting: Psychology

## 2021-03-16 DIAGNOSIS — F331 Major depressive disorder, recurrent, moderate: Secondary | ICD-10-CM

## 2021-03-16 NOTE — Progress Notes (Signed)
[facility] Counselor/Therapist Progress Note  Patient ID: Veronica [name], MRN: [id number],    Date: 03/16/2021  Time Spent: 12:00pm-12:45pm  45 minutes   Treatment Type: Individual Therapy  Reported Symptoms: stress, loneliness  Mental Status Exam: Appearance:  Casual     Behavior: Appropriate  Motor: Normal  Speech/Language:  Normal Rate  Affect: Appropriate  Mood: normal  Thought process: normal  Thought content:   WNL  Sensory/Perceptual disturbances:   WNL  Orientation: oriented to person, place, time/date, and situation  Attention: Good  Concentration: Good  Memory: WNL  Fund of knowledge:  Good  Insight:   Good  Judgment:  Good  Impulse Control: Good   Risk Assessment: Danger to Self:  No Self-injurious Behavior: No Danger to Others: No Duty to Warn:no Physical Aggression / Violence:No  Access to Firearms a concern: No  Gang Involvement:No   Subjective: Pt present for individual therapy.   Pt consented to telehealth virtual phone session due to [place] 19 pandemic.   Pt's location: home Therapist location: home office Pt talked about her health.  She is still dealing with recovering from oral surgery.  Pt has a very limited diet.  She is feeling stress about the cost of the surgeries.   Pt talked about Christmas.  Pt was not able to see her brother.   Her brother had issues with his son and son's wife and was upset by the family dynamics.  A couple of pt's neighbors visited her on Christmas and took her presents.  Pt was very grateful and was glad she wasn't totally alone on Christmas.   Pt is hoping to have a better year this year as she heals from the surgeries.   She is working on having positive thoughts.  Worked on self care strategies. Provided supportive counseling.    Interventions: Cognitive Behavioral Therapy and Insight-Oriented  Diagnosis:  F33.1  Plan: See pt's Treatment Plan for depression in Therapy Charts.  (Treatment Plan  Target Date: 06/09/2021) Pt is progressing toward treatment goals.   Plan to continue to see pt every two weeks.     Hassaan Crite, LCSW

## 2021-03-24 NOTE — Telephone Encounter (Signed)
P,t has stopped by, requesting that Follett call her regarding the paperwork. She is concerned about a call from the office, and wants to clarify some things.

## 2021-03-26 NOTE — Telephone Encounter (Addendum)
Pt is calling back stating that [facility]ist need more Information. And requesting that Lucy call [facility] back to find out what information is needed so that she can get her medication.   Pt is then requesting a call back to her to inform her to the update. CB [phone]

## 2021-03-26 NOTE — Telephone Encounter (Signed)
Pt medication Vrayler 1.5mg  came in. Called pt inform her that her medication had arrived 3 bottles #30. She states she is not sure abt a call that she received on Sunday. They left a  mas stating that they ws needing her social security number. Inform pt she need to disregard that msg.. Maybe a scammer, but med is ready for pick-up. Pt will pick up tomorrow.Marland KitchenJohny Chess

## 2021-03-30 ENCOUNTER — Ambulatory Visit (INDEPENDENT_AMBULATORY_CARE_PROVIDER_SITE_OTHER): Payer: Medicare Other | Admitting: Psychology

## 2021-03-30 DIAGNOSIS — F331 Major depressive disorder, recurrent, moderate: Secondary | ICD-10-CM

## 2021-03-30 NOTE — Progress Notes (Signed)
[facility] Counselor/Therapist Progress Note  Patient ID: Veronica [name], MRN: [id number],    Date: 03/30/2021  Time Spent: 12:00pm-12:45pm  45 minutes   Treatment Type: Individual Therapy  Reported Symptoms: stress, loneliness  Mental Status Exam: Appearance:  Casual     Behavior: Appropriate  Motor: Normal  Speech/Language:  Normal Rate  Affect: Appropriate  Mood: normal  Thought process: normal  Thought content:   WNL  Sensory/Perceptual disturbances:   WNL  Orientation: oriented to person, place, time/date, and situation  Attention: Good  Concentration: Good  Memory: WNL  Fund of knowledge:  Good  Insight:   Good  Judgment:  Good  Impulse Control: Good   Risk Assessment: Danger to Self:  No Self-injurious Behavior: No Danger to Others: No Duty to Warn:no Physical Aggression / Violence:No  Access to Firearms a concern: No  Gang Involvement:No   Subjective: Pt present for individual therapy.   Pt consented to telehealth virtual phone session due to [place] 19 pandemic.   Pt's location: home Therapist location: home office Pt talked about her health.  She feels down about all she is having go through regarding her teeth and the oral surgeries.   Pt has worries about finances bc of all of the dental and medical bills.   Addressed pt's concerns. Worked with pt on how she can increase her connections with people.  She has good neighbors who check in with her.   She keeps in touch with her brother.   Worked on self care strategies. Provided supportive counseling.    Interventions: Cognitive Behavioral Therapy and Insight-Oriented  Diagnosis:  F33.1  Plan: See pt's Treatment Plan for depression in Therapy Charts.  (Treatment Plan Target Date: 06/09/2021) Pt is progressing toward treatment goals.   Plan to continue to see pt every two weeks.     Natsuko Kelsay, LCSW                  Becky [place], LCSW

## 2021-03-31 ENCOUNTER — Other Ambulatory Visit: Payer: Self-pay | Admitting: *Deleted

## 2021-03-31 NOTE — Patient Outreach (Addendum)
[facility]) Care Management  03/31/2021  Veronica [name] [birth year] [id number]  [facility]) Care Management RN Health Coach Note   03/31/2021 Name:  Veronica [name] MRN:  [id number] DOB:  12/02/1950  Summary: Patient states she is doing fairly well. Patient reports that her COPD is controlled at this time. She continues to use her rescue medication as needed and adds she has not had a recent exacerbation. Nurse discussed infection prevention and continuation of doing breathing exercises. Patient explains that she had another oral surgery several weeks ago. Currently the patient states her mouth is sore but tolerable. She is slowly adding soft foods into her diet and continues to supplement several times a day with Boost/Ensure. Patient states that she has been able to maintain her weight and stay hydrated. Patient shares that she will need more surgeries to complete her teeth implants. Patient did not have any further questions or concerns today and did confirm that she has this nurse's contact number to call her if needed.   Recommendations/Changes made from today's visit: Continue to walk around your home, do chair exercises, and stay as physically active as tolerated Contact providers for COPD symptoms not resolving within 48 hours and as needed Continue to supplement with Ensure, Boost, Gatorade to maintain nutrition, weight, and hydration Continue to weigh weekly to ensure weight is being maintained Try to eat soft foods as tolerated like soup, yogurt, and puree food in a blender   Subjective: Veronica [name] is an 71 y.o. year old female who is a primary patient of Plotnikov, Evie Lacks, MD. The care management team was consulted for assistance with care management and/or care coordination needs.    RN Health Coach completed Telephone Visit today.   Objective:  Medications Reviewed Today     Reviewed by Michiel Cowboy, RN (Registered Nurse) on  03/31/21 at 44  Med List Status: <None>   Medication Order Taking? Sig Documenting Provider Last Dose Status Informant  acetaminophen-codeine (TYLENOL #3) 300-30 MG tablet [id number] Yes TAKE 1 TABLET BY MOUTH EVERY 6 HOURS AS NEEDED Plotnikov, Evie Lacks, MD Taking Active   albuterol (PROVENTIL) (2.5 MG/3ML) 0.083% nebulizer solution [id number] Yes USE 1 VIAL IN NEBULIZER 2 TIMES DAILY. Generic: Henri Medal, MD Taking Active            Med Note Laretta Alstrom, Celester Lech A   Thu Aug 28, 2020  1:52 PM)    albuterol (VENTOLIN HFA) 108 (90 Base) MCG/ACT inhaler [id number] Yes INHALE 2 PUFFS INTO THE LUNGS EVERY 4 HOURS AS NEEDED FOR WHEEZING OR SHORTNESS OF BREATH Plotnikov, Evie Lacks, MD Taking Active   aspirin 81 MG chewable tablet [id number] Yes Chew by mouth daily. [provider] Taking Active Self  atorvastatin (LIPITOR) 40 MG tablet [id number] Yes TAKE 1 TABLET(40 MG) BY MOUTH DAILY Plotnikov, Evie Lacks, MD Taking Active   B-D INSULIN SYRINGE 1CC/25GX1" 25G X 1" 1 ML MISC [id number] Yes USE AS DIRECTED EVERY 2 WEEKS Plotnikov, Evie Lacks, MD Taking Active   budesonide (PULMICORT) 0.5 MG/2ML nebulizer solution [id number] Yes Take 2 mLs (0.5 mg total) by nebulization in the morning and at bedtime. Hunsucker, Bonna Gains, MD Taking Active   calcium carbonate (OSCAL) 1500 (600 Ca) MG TABS tablet [id number] Yes Take 600 mg of elemental calcium by mouth 2 (two) times daily with a meal.  [provider] Taking Active   cariprazine (VRAYLAR) 1.5 MG capsule [id number] Yes Take 1 capsule (1.5 mg total)  by mouth daily. Lot # [id number] EXP 0701/22 Plotnikov, Evie Lacks, MD Taking Active   cariprazine (VRAYLAR) 1.5 MG capsule [id number] Yes Take 1 capsule (1.5 mg total) by mouth daily. Fax to [facility] @ [phone] Plotnikov, Evie Lacks, MD Taking Active   chlorhexidine (PERIDEX) 0.12 % solution [id number] Yes Use to wash mouth twice a day [provider] Taking Active   Cholecalciferol (VITAMIN D3)  50 MCG (2000 UT) capsule [id number] Yes Take 1 capsule (2,000 Units total) by mouth daily. Plotnikov, Evie Lacks, MD Taking Active   clonazePAM (KLONOPIN) 1 MG tablet [id number] Yes TAKE 2 TABLETS BY MOUTH EVERY NIGHT AT BEDTIME AND 1/2 TABLET EXTRA IN DAYTIME AS NEEDED Plotnikov, Evie Lacks, MD Taking Active   cyanocobalamin (,VITAMIN B-12,) 1000 MCG/ML injection [id number] Yes INJECT 1ML IN THE MUSCLE EVERY 14 DAYS. Plotnikov, Evie Lacks, MD Taking Active   diphenoxylate-atropine (LOMOTIL) 2.5-0.025 MG tablet [id number] Yes Take 1-2 tablets by mouth 4 (four) times daily as needed for diarrhea or loose stools. Plotnikov, Evie Lacks, MD Taking Active   Docusate Calcium (STOOL SOFTENER PO) [id number] Yes Take 100 mg by mouth as needed (for constipation).  [provider] Taking Active Self           Med Note Page Spiro   Fri Nov 28, 2020 12:33 PM)    escitalopram (LEXAPRO) 20 MG tablet [id number] Yes Take 1 tablet (20 mg total) by mouth daily. Plotnikov, Evie Lacks, MD Taking Active   formoterol (PERFOROMIST) 20 MCG/2ML nebulizer solution [id number] Yes Take 2 mLs (20 mcg total) by nebulization 2 (two) times daily. Martyn Ehrich, NP Taking Active Self  furosemide (LASIX) 20 MG tablet [id number] Yes TAKE 1 TABLET(20 MG) BY MOUTH DAILY AS NEEDED Plotnikov, Evie Lacks, MD Taking Active   levofloxacin (LEVAQUIN) 500 MG tablet [id number] No Take one tab PO once daily for one week  Patient not taking: Reported on 03/31/2021   Plotnikov, Evie Lacks, MD Not Taking Active            Med Note Laretta Alstrom, Milfred Krammes A   Tue Mar 31, 2021 11:16 AM) completed  levothyroxine (SYNTHROID) 88 MCG tablet [id number] Yes TAKE 1 TABLET(88 MCG) BY MOUTH DAILY Plotnikov, Evie Lacks, MD Taking Active   nitroGLYCERIN (NITROSTAT) 0.4 MG SL tablet [id number] Yes TAKE 1 TABLET UNDER THE TOUNGE AS DIRECTED Plotnikov, Evie Lacks, MD Taking Active   Omega-3 Fatty Acids (FISH OIL) 1000 MG CAPS [id number] Yes Take 1 capsule by mouth daily.  [provider] Taking Active   phenytoin (DILANTIN) 100 MG ER capsule [id number] Yes TAKE 2 CAPSULES BY MOUTH EVERY MORNING AND 1 CAPSULE EVERY EVENING Plotnikov, Evie Lacks, MD Taking Active   promethazine (PHENERGAN) 12.5 MG tablet [id number] Yes Take by mouth. [provider] Taking Active   Promethazine-Codeine 6.25-10 MG/5ML SOLN [id number] Yes TAKE 5 ML BY MOUTH EVERY 6 HOURS AS NEEDED. DO NOT TAKE WITH TYLENOL 3 Plotnikov, Evie Lacks, MD Taking Active   Respiratory Therapy Supplies (FLUTTER) DEVI [id number] Yes Use after nebulization treatments Javier Glazier, MD Taking Active   sodium chloride (MURO 128) 5 % ophthalmic solution [id number] Yes Place 1 drop into both eyes as needed for irritation.  [provider] Taking Active Self           Med Note Toribio Harbour, AMBER C   Tue Jul 10, 2019  1:42 PM) She is using daily.  SYRINGE-NEEDLE, DISP, 3 ML (BD ECLIPSE SYRINGE) 25G X 1" 3 ML MISC [id number] Yes Use  sq q 2 wks Plotnikov, Evie Lacks, MD Taking Active   traZODone (DESYREL) 50 MG tablet [id number] Yes TAKE 4 TABLETS(200 MG) BY MOUTH AT BEDTIME Plotnikov, Evie Lacks, MD Taking Active   verapamil (CALAN-SR) 240 MG CR tablet [id number] Yes TAKE 1 TABLET(240 MG) BY MOUTH AT BEDTIME Plotnikov, Evie Lacks, MD Taking Active              SDOH:  (Social Determinants of Health) assessments and interventions performed: SDOH assessments completed today and documented in the Epic system.     Care Plan  Review of patient past medical history, allergies, medications, health status, including review of consultants reports, laboratory and other test data, was performed as part of comprehensive evaluation for care management services.   Care Plan : RN Care Manager Plan of Care  Updates made by Michiel Cowboy, RN since 03/31/2021 12:00 AM     Problem: Knowledge Deficit Related to COPD and Maintaining Weight during ongoing oral surgeries   Priority: High     Long-Range Goal:  Development of Plan of Care for Management of COPD and Maintaining Weight During Ongoing Oral Surgeries   Start Date: 01/27/2021  Expected End Date: 02/11/2022  Priority: High  Note:   Current Barriers:  Chronic Disease Management support and education needs related to COPD Ongoing oral surgeries which cause it to be difficult to maintain nutrition and weight  RNCM Clinical Goal(s):  Patient will verbalize understanding of plan for management of COPD take all medications exactly as prescribed and will call provider for medication related questions demonstrate Ongoing health management independence with managing treatment of COPD continue to work with RN Care Manager to address care management and care coordination needs related to  COPD and management of maintaining weight and nutrition during continuation of oral surgeries work with Education officer, museum to address  related to the management of [place] Concerns  related to the management of Depression through collaboration with Consulting civil engineer, provider, and care team.   Interventions: Inter-disciplinary care team collaboration (see longitudinal plan of care) Evaluation of current treatment plan related to  self management and patient's adherence to plan as established by provider Nurse provided written COPD education previously Nurse will send Ensure Coupons Encouraged patient to do breathing exercises, chair exercises, and walk around home routinely as tolerated  Discussed incorporating soft foods as tolerated like soup and puree food in blender  Patient Goals/Self-Care Activities: Take medications as prescribed   Attend all scheduled provider appointments - identify and avoid work-related triggers - identify and remove indoor air pollutants - limit outdoor activity during cold weather - do breathing exercises every day - eliminate symptom triggers at home - follow rescue plan if symptoms flare-up - use an extra pillow to sleep -  use devices that will help like a cane, sock-puller or reacher - do breathing exercises every day Continue to walk around your home, do chair exercises, and stay as physically active as tolerated Contact providers for COPD symptoms not resolving within 48 hours and as needed Continue to supplement with Ensure, Boost, Gatorade to maintain nutrition, weight, and hydration Continue to weigh weekly to ensure weight is being maintained Try to eat soft foods as tolerated like soup, yogurt, and puree food in a blender         Plan: Telephone follow up appointment with care management team member scheduled for:  April.   Emelia Loron RN, [place] [phone] [email or web address]

## 2021-03-31 NOTE — Patient Instructions (Signed)
Visit Information  Thank you for taking time to visit with me today. Please don't hesitate to contact me if I can be of assistance to you before our next scheduled telephone appointment.  Following are the goals we discussed today:  Patient Goals/Self-Care Activities: Take medications as prescribed   Attend all scheduled provider appointments - identify and avoid work-related triggers - identify and remove indoor air pollutants - limit outdoor activity during cold weather - do breathing exercises every day - eliminate symptom triggers at home - follow rescue plan if symptoms flare-up - use an extra pillow to sleep - use devices that will help like a cane, sock-puller or reacher - do breathing exercises every day Continue to walk around your home, do chair exercises, and stay as physically active as tolerated Contact providers for COPD symptoms not resolving within 48 hours and as needed Continue to supplement with Ensure, Boost, Gatorade to maintain nutrition, weight, and hydration Continue to weigh weekly to ensure weight is being maintained Try to eat soft foods as tolerated like soup, yogurt, and puree food in a blender    The patient verbalized understanding of instructions, educational materials, and care plan provided today and agreed to receive a mailed copy of patient instructions, educational materials, and care plan.   Telephone follow up appointment with care management team member scheduled XOV:ANVBT  Emelia Loron RN, [place] [phone] [email or web address]

## 2021-04-08 ENCOUNTER — Other Ambulatory Visit: Payer: Self-pay | Admitting: Internal Medicine

## 2021-04-13 ENCOUNTER — Ambulatory Visit (INDEPENDENT_AMBULATORY_CARE_PROVIDER_SITE_OTHER): Payer: Medicare Other | Admitting: Psychology

## 2021-04-13 DIAGNOSIS — F331 Major depressive disorder, recurrent, moderate: Secondary | ICD-10-CM

## 2021-04-13 NOTE — Progress Notes (Signed)
[place] Counselor/Therapist Progress Note  Patient ID: Veronica [name], MRN: [id number],    Date: 04/13/2021  Time Spent: 12:00pm-12:45pm  45 minutes   Treatment Type: Individual Therapy  Reported Symptoms: stress, loneliness  Mental Status Exam: Appearance:  Casual     Behavior: Appropriate  Motor: Normal  Speech/Language:  Normal Rate  Affect: Appropriate  Mood: normal  Thought process: normal  Thought content:   WNL  Sensory/Perceptual disturbances:   WNL  Orientation: oriented to person, place, time/date, and situation  Attention: Good  Concentration: Good  Memory: WNL  Fund of knowledge:  Good  Insight:   Good  Judgment:  Good  Impulse Control: Good   Risk Assessment: Danger to Self:  No Self-injurious Behavior: No Danger to Others: No Duty to Warn:no Physical Aggression / Violence:No  Access to Firearms a concern: No  Gang Involvement:No   Subjective: Pt present for individual therapy.   Pt consented to telehealth virtual phone session due to [place] 19 pandemic.   Pt's location: home Therapist location: home office Pt talked about her health.  She feels down about all she is having go through regarding her teeth and the oral surgeries.  Pt is also having trouble with her pharmacy.  On 2 occasions they did not give her all of her medication but charged her the full bill.  Pt feels she needs to change pharmacies and will change to [facility] who provides more individualized care.   Pt gets worried about making a change.  Helped pt problem solve. Pt has worries about finances bc of all of the dental and medical bills.   Pt feels stress about the mail and bills that have piled up.   She has to prepare to do her taxes.  Addressed pt's concerns. Worked with pt on how she can increase her connections with people.  She has good neighbors who check in with her.   She keeps in touch with her brother.   Worked on self care strategies. Provided  supportive counseling.    Interventions: Cognitive Behavioral Therapy and Insight-Oriented  Diagnosis:  F33.1  Plan: See pt's Treatment Plan for depression in Therapy Charts.  (Treatment Plan Target Date: 06/09/2021) Pt is progressing toward treatment goals.   Plan to continue to see pt every two weeks.     Gimena Buick, LCSW

## 2021-04-15 ENCOUNTER — Ambulatory Visit (INDEPENDENT_AMBULATORY_CARE_PROVIDER_SITE_OTHER): Payer: Medicare Other

## 2021-04-15 ENCOUNTER — Other Ambulatory Visit: Payer: Self-pay

## 2021-04-15 DIAGNOSIS — M810 Age-related osteoporosis without current pathological fracture: Secondary | ICD-10-CM

## 2021-04-15 DIAGNOSIS — Z0000 Encounter for general adult medical examination without abnormal findings: Secondary | ICD-10-CM

## 2021-04-15 DIAGNOSIS — Z1231 Encounter for screening mammogram for malignant neoplasm of breast: Secondary | ICD-10-CM

## 2021-04-15 NOTE — Patient Instructions (Signed)
Veronica [name] , Thank you for taking time to come for your Medicare Wellness Visit. I appreciate your ongoing commitment to your health goals. Please review the following plan we discussed and let me know if I can assist you in the future.   Screening recommendations/referrals: Colonoscopy: 03/23/2019; due every 5 years (due 03/22/2024) Mammogram: ordered 04/15/2021; office will call patient to schedule Bone Density: ordered 04/15/2021; office will call patient to schedule Recommended yearly ophthalmology/optometry visit for glaucoma screening and checkup Recommended yearly dental visit for hygiene and checkup  Vaccinations: Influenza vaccine: 03/05/2021; due every Fall season Pneumococcal vaccine: 11/13/2013, 10/07/2015 Tdap vaccine: 02/11/2014; due every 10 years (due 02/12/2024) Shingles vaccine: Never done; Please call your insurance company to determine your out of pocket expense for the Shingrix vaccine. You may receive this vaccine at your local pharmacy. Covid-19: 05/30/2019, 06/26/2019, 03/14/2020  Advanced directives: Please bring a copy of your health care power of attorney and living will to the office at your convenience.  Conditions/risks identified: Yes; Patient understands the importance of follow-up with providers by attending scheduled visits and discussed goals to eat healthier, increase physical activity, exercise the brain, socialize more, get enough sleep and make time for laughter.  Next appointment: 04/16/2022 AT 11:20 A.M. TELEPHONE ANNUAL WELLNESS VISIT WITH Makyah Lavigne, LPN.   Preventive Care 72 Years and Older, Female Preventive care refers to lifestyle choices and visits with your health care provider that can promote health and wellness. What does preventive care include? A yearly physical exam. This is also called an annual well check. Dental exams once or twice a year. Routine eye exams. Ask your health care provider how often you should have your eyes  checked. Personal lifestyle choices, including: Daily care of your teeth and gums. Regular physical activity. Eating a healthy diet. Avoiding tobacco and drug use. Limiting alcohol use. Practicing safe sex. Taking low-dose aspirin every day. Taking vitamin and mineral supplements as recommended by your health care provider. What happens during an annual well check? The services and screenings done by your health care provider during your annual well check will depend on your age, overall health, lifestyle risk factors, and family history of disease. Counseling  Your health care provider may ask you questions about your: Alcohol use. Tobacco use. Drug use. Emotional well-being. Home and relationship well-being. Sexual activity. Eating habits. History of falls. Memory and ability to understand (cognition). Work and work Statistician. Reproductive health. Screening  You may have the following tests or measurements: Height, weight, and BMI. Blood pressure. Lipid and cholesterol levels. These may be checked every 5 years, or more frequently if you are over 72 years old. Skin check. Lung cancer screening. You may have this screening every year starting at age 13 if you have a 30-pack-year history of smoking and currently smoke or have quit within the past 15 years. Fecal occult blood test (FOBT) of the stool. You may have this test every year starting at age 24. Flexible sigmoidoscopy or colonoscopy. You may have a sigmoidoscopy every 5 years or a colonoscopy every 10 years starting at age 31. Hepatitis C blood test. Hepatitis B blood test. Sexually transmitted disease (STD) testing. Diabetes screening. This is done by checking your blood sugar (glucose) after you have not eaten for a while (fasting). You may have this done every 1-3 years. Bone density scan. This is done to screen for osteoporosis. You may have this done starting at age 69. Mammogram. This may be done every 1-2  years. Talk  to your health care provider about how often you should have regular mammograms. Talk with your health care provider about your test results, treatment options, and if necessary, the need for more tests. Vaccines  Your health care provider may recommend certain vaccines, such as: Influenza vaccine. This is recommended every year. Tetanus, diphtheria, and acellular pertussis (Tdap, Td) vaccine. You may need a Td booster every 10 years. Zoster vaccine. You may need this after age 70. Pneumococcal 13-valent conjugate (PCV13) vaccine. One dose is recommended after age 57. Pneumococcal polysaccharide (PPSV23) vaccine. One dose is recommended after age 59. Talk to your health care provider about which screenings and vaccines you need and how often you need them. This information is not intended to replace advice given to you by your health care provider. Make sure you discuss any questions you have with your health care provider. Document Released: 03/28/2015 Document Revised: 11/19/2015 Document Reviewed: 12/31/2014 Elsevier Interactive Patient Education  2017 [place] Prevention in the Home Falls can cause injuries. They can happen to people of all ages. There are many things you can do to make your home safe and to help prevent falls. What can I do on the outside of my home? Regularly fix the edges of walkways and driveways and fix any cracks. Remove anything that might make you trip as you walk through a door, such as a raised step or threshold. Trim any bushes or trees on the path to your home. Use bright outdoor lighting. Clear any walking paths of anything that might make someone trip, such as rocks or tools. Regularly check to see if handrails are loose or broken. Make sure that both sides of any steps have handrails. Any raised decks and porches should have guardrails on the edges. Have any leaves, snow, or ice cleared regularly. Use sand or salt on walking paths  during winter. Clean up any spills in your garage right away. This includes oil or grease spills. What can I do in the bathroom? Use night lights. Install grab bars by the toilet and in the tub and shower. Do not use towel bars as grab bars. Use non-skid mats or decals in the tub or shower. If you need to sit down in the shower, use a plastic, non-slip stool. Keep the floor dry. Clean up any water that spills on the floor as soon as it happens. Remove soap buildup in the tub or shower regularly. Attach bath mats securely with double-sided non-slip rug tape. Do not have throw rugs and other things on the floor that can make you trip. What can I do in the bedroom? Use night lights. Make sure that you have a light by your bed that is easy to reach. Do not use any sheets or blankets that are too big for your bed. They should not hang down onto the floor. Have a firm chair that has side arms. You can use this for support while you get dressed. Do not have throw rugs and other things on the floor that can make you trip. What can I do in the kitchen? Clean up any spills right away. Avoid walking on wet floors. Keep items that you use a lot in easy-to-reach places. If you need to reach something above you, use a strong step stool that has a grab bar. Keep electrical cords out of the way. Do not use floor polish or wax that makes floors slippery. If you must use wax, use non-skid floor wax. Do not  have throw rugs and other things on the floor that can make you trip. What can I do with my stairs? Do not leave any items on the stairs. Make sure that there are handrails on both sides of the stairs and use them. Fix handrails that are broken or loose. Make sure that handrails are as long as the stairways. Check any carpeting to make sure that it is firmly attached to the stairs. Fix any carpet that is loose or worn. Avoid having throw rugs at the top or bottom of the stairs. If you do have throw  rugs, attach them to the floor with carpet tape. Make sure that you have a light switch at the top of the stairs and the bottom of the stairs. If you do not have them, ask someone to add them for you. What else can I do to help prevent falls? Wear shoes that: Do not have high heels. Have rubber bottoms. Are comfortable and fit you well. Are closed at the toe. Do not wear sandals. If you use a stepladder: Make sure that it is fully opened. Do not climb a closed stepladder. Make sure that both sides of the stepladder are locked into place. Ask someone to hold it for you, if possible. Clearly mark and make sure that you can see: Any grab bars or handrails. First and last steps. Where the edge of each step is. Use tools that help you move around (mobility aids) if they are needed. These include: Canes. Walkers. Scooters. Crutches. Turn on the lights when you go into a dark area. Replace any light bulbs as soon as they burn out. Set up your furniture so you have a clear path. Avoid moving your furniture around. If any of your floors are uneven, fix them. If there are any pets around you, be aware of where they are. Review your medicines with your doctor. Some medicines can make you feel dizzy. This can increase your chance of falling. Ask your doctor what other things that you can do to help prevent falls. This information is not intended to replace advice given to you by your health care provider. Make sure you discuss any questions you have with your health care provider. Document Released: 12/26/2008 Document Revised: 08/07/2015 Document Reviewed: 04/05/2014 Elsevier Interactive Patient Education  2017 [employer].
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
## 2021-04-27 ENCOUNTER — Ambulatory Visit (INDEPENDENT_AMBULATORY_CARE_PROVIDER_SITE_OTHER): Payer: Medicare Other | Admitting: Psychology

## 2021-04-27 DIAGNOSIS — F331 Major depressive disorder, recurrent, moderate: Secondary | ICD-10-CM | POA: Diagnosis not present

## 2021-04-27 NOTE — Progress Notes (Signed)
[facility] Counselor/Therapist Progress Note  Patient ID: Veronica [name], MRN: [id number],    Date: 04/27/2021  Time Spent: 12:00pm-12:45pm  45 minutes   Treatment Type: Individual Therapy  Reported Symptoms: stress, loneliness  Mental Status Exam: Appearance:  Casual     Behavior: Appropriate  Motor: Normal  Speech/Language:  Normal Rate  Affect: Appropriate  Mood: normal  Thought process: normal  Thought content:   WNL  Sensory/Perceptual disturbances:   WNL  Orientation: oriented to person, place, time/date, and situation  Attention: Good  Concentration: Good  Memory: WNL  Fund of knowledge:  Good  Insight:   Good  Judgment:  Good  Impulse Control: Good   Risk Assessment: Danger to Self:  No Self-injurious Behavior: No Danger to Others: No Duty to Warn:no Physical Aggression / Violence:No  Access to Firearms a concern: No  Gang Involvement:No   Subjective: Pt present for individual therapy.   Pt consented to telehealth virtual phone session due to [place] 19 pandemic.   Pt's location: home Therapist location: home office Pt talked about having a bad day yesterday.  She had a lot of worries and had trouble letting go of them.  Pt is having trouble putting things behind her.  She has been thinking about mistakes she made in her life.  Addressed how pt can let go of her thoughts and feelings that she ruminates about.  Worked on thought reframing.  Recommended pt journal her thoughts and discard them.   Pt talked about her health.   She is able to eat soft foods now.  She has an upcoming appointment with her oral surgeon.   Worked with pt on how she can increase her connections with people.  She has good neighbors who check in with her.   She keeps in touch with her brother.   Worked on self care strategies. Provided supportive counseling.    Interventions: Cognitive Behavioral Therapy and Insight-Oriented  Diagnosis:  F33.1  Plan: See pt's Treatment  Plan for depression in Therapy Charts.  (Treatment Plan Target Date: 06/09/2021) Pt is progressing toward treatment goals.   Plan to continue to see pt every two weeks.     Danett Palazzo, LCSW

## 2021-05-09 ENCOUNTER — Other Ambulatory Visit: Payer: Self-pay | Admitting: Internal Medicine

## 2021-05-11 ENCOUNTER — Ambulatory Visit (INDEPENDENT_AMBULATORY_CARE_PROVIDER_SITE_OTHER): Payer: Medicare Other | Admitting: Psychology

## 2021-05-11 DIAGNOSIS — F331 Major depressive disorder, recurrent, moderate: Secondary | ICD-10-CM | POA: Diagnosis not present

## 2021-05-11 NOTE — Progress Notes (Signed)
[place] Counselor/Therapist Progress Note  Patient ID: Veronica [name], MRN: [id number],    Date: 05/11/2021  Time Spent: 12:00pm-12:45pm  45 minutes   Treatment Type: Individual Therapy  Reported Symptoms: stress, loneliness  Mental Status Exam: Appearance:  Casual     Behavior: Appropriate  Motor: Normal  Speech/Language:  Normal Rate  Affect: Appropriate  Mood: normal  Thought process: normal  Thought content:   WNL  Sensory/Perceptual disturbances:   WNL  Orientation: oriented to person, place, time/date, and situation  Attention: Good  Concentration: Good  Memory: WNL  Fund of knowledge:  Good  Insight:   Good  Judgment:  Good  Impulse Control: Good   Risk Assessment: Danger to Self:  No Self-injurious Behavior: No Danger to Others: No Duty to Warn:no Physical Aggression / Violence:No  Access to Firearms a concern: No  Gang Involvement:No   Subjective: Pt present for individual therapy.   Pt consented to telehealth virtual phone session due to [place] 19 pandemic.   Pt's location: home Therapist location: home office Pt talked about journaling about her feelings and tearing up the paper and throwing it away.  She states this has been helpful for her.  Addressed the thoughts and worries pt was struggling with.   Worked on thought reframing.  Recommended pt continue to journal her thoughts and discard them.   Pt talked about her health.   She is able to eat soft foods now.   Worked with pt on how she can increase her connections with people.  She has good neighbors who check in with her.   She keeps in touch with her brother.   Worked on self care strategies. Provided supportive counseling.    Interventions: Cognitive Behavioral Therapy and Insight-Oriented  Diagnosis:  F33.1  Plan: See pt's Treatment Plan for depression in Therapy Charts.  (Treatment Plan Target Date: 06/09/2021) Pt is progressing toward treatment goals.   Plan to continue to  see pt every two weeks.     Dori Devino, LCSW

## 2021-05-12 ENCOUNTER — Other Ambulatory Visit: Payer: Self-pay

## 2021-05-12 ENCOUNTER — Ambulatory Visit
Admission: RE | Admit: 2021-05-12 | Discharge: 2021-05-12 | Disposition: A | Discharge status: Home / Self Care | Payer: Medicare Other | Source: Ambulatory Visit | Attending: Internal Medicine | Admitting: Internal Medicine

## 2021-05-12 DIAGNOSIS — Z1231 Encounter for screening mammogram for malignant neoplasm of breast: Secondary | ICD-10-CM

## 2021-05-25 ENCOUNTER — Ambulatory Visit (INDEPENDENT_AMBULATORY_CARE_PROVIDER_SITE_OTHER): Payer: Medicare Other | Admitting: Psychology

## 2021-05-25 DIAGNOSIS — F331 Major depressive disorder, recurrent, moderate: Secondary | ICD-10-CM | POA: Diagnosis not present
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
## 2021-06-06 ENCOUNTER — Other Ambulatory Visit: Payer: Self-pay | Admitting: Internal Medicine

## 2021-06-08 ENCOUNTER — Ambulatory Visit (INDEPENDENT_AMBULATORY_CARE_PROVIDER_SITE_OTHER): Payer: Medicare Other | Admitting: Psychology

## 2021-06-08 DIAGNOSIS — F331 Major depressive disorder, recurrent, moderate: Secondary | ICD-10-CM
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
## 2021-06-09 ENCOUNTER — Ambulatory Visit (INDEPENDENT_AMBULATORY_CARE_PROVIDER_SITE_OTHER): Payer: Medicare Other | Admitting: Internal Medicine

## 2021-06-09 ENCOUNTER — Encounter: Payer: Self-pay | Admitting: Internal Medicine

## 2021-06-09 VITALS — BP 120/78 | HR 68 | Temp 97.6°F | Ht 60.0 in | Wt 145.0 lb

## 2021-06-09 DIAGNOSIS — L509 Urticaria, unspecified: Secondary | ICD-10-CM

## 2021-06-09 DIAGNOSIS — M545 Low back pain, unspecified: Secondary | ICD-10-CM

## 2021-06-09 DIAGNOSIS — G40909 Epilepsy, unspecified, not intractable, without status epilepticus: Secondary | ICD-10-CM

## 2021-06-09 DIAGNOSIS — G8929 Other chronic pain: Secondary | ICD-10-CM

## 2021-06-09 DIAGNOSIS — J449 Chronic obstructive pulmonary disease, unspecified: Secondary | ICD-10-CM | POA: Diagnosis not present

## 2021-06-09 DIAGNOSIS — R739 Hyperglycemia, unspecified: Secondary | ICD-10-CM | POA: Diagnosis not present

## 2021-06-09 DIAGNOSIS — E785 Hyperlipidemia, unspecified: Secondary | ICD-10-CM

## 2021-06-09 DIAGNOSIS — E559 Vitamin D deficiency, unspecified: Secondary | ICD-10-CM | POA: Diagnosis not present

## 2021-06-09 DIAGNOSIS — E538 Deficiency of other specified B group vitamins: Secondary | ICD-10-CM

## 2021-06-09 DIAGNOSIS — E539 Vitamin B deficiency, unspecified: Secondary | ICD-10-CM

## 2021-06-09 DIAGNOSIS — R053 Chronic cough: Secondary | ICD-10-CM

## 2021-06-09 DIAGNOSIS — E875 Hyperkalemia: Secondary | ICD-10-CM

## 2021-06-09 MED ORDER — ACETAMINOPHEN-CODEINE #3 300-30 MG PO TABS: 1.0000 | ORAL_TABLET | Freq: Four times a day (QID) | ORAL | 2 refills | Status: DC | PRN

## 2021-06-09 MED ORDER — ALBUTEROL SULFATE HFA 108 (90 BASE) MCG/ACT IN AERS: 2.0000 | INHALATION_SPRAY | RESPIRATORY_TRACT | 11 refills | Status: DC | PRN

## 2021-06-09 MED ORDER — CARIPRAZINE HCL 1.5 MG PO CAPS: 1.5000 mg | ORAL_CAPSULE | Freq: Every day | ORAL | 3 refills | Status: DC

## 2021-06-09 NOTE — Assessment & Plan Note (Signed)
Chronic  ?On Vit D ?

## 2021-06-09 NOTE — Assessment & Plan Note (Signed)
Chronic ?On Vit B12 inj ?

## 2021-06-09 NOTE — Assessment & Plan Note (Signed)
Chronic  Cont on Tylenol #3 prn  Potential benefits of a long term opioids use as well as potential risks (i.e. addiction risk, apnea etc) and complications (i.e. Somnolence, constipation and others) were explained to the patient and were aknowledged. 

## 2021-06-09 NOTE — Assessment & Plan Note (Signed)
Worse - URI/COPD xacerbation ?Just finished Levaquin ?

## 2021-06-09 NOTE — Progress Notes (Signed)
? ?Subjective:  ?Patient ID: Veronica [name], female    DOB: November 03, 1950  Age: 71 y.o. MRN: [id number] ? ?CC: No chief complaint on file. ? ? ?HPI ?Gabriel Rung presents for chronic cough - worse, chronic pain, depression f/u ? ?Outpatient Medications Prior to Visit  ?Medication Sig Dispense Refill  ? albuterol (PROVENTIL) (2.5 MG/3ML) 0.083% nebulizer solution USE 1 VIAL IN NEBULIZER 2 TIMES DAILY. Generic: VENTOLIN 60 vial 5  ? aspirin 81 MG chewable tablet Chew by mouth daily.    ? atorvastatin (LIPITOR) 40 MG tablet TAKE 1 TABLET(40 MG) BY MOUTH DAILY 90 tablet 3  ? B-D INSULIN SYRINGE 1CC/25GX1" 25G X 1" 1 ML MISC USE AS DIRECTED EVERY 2 WEEKS 50 each 5  ? budesonide (PULMICORT) 0.5 MG/2ML nebulizer solution Take 2 mLs (0.5 mg total) by nebulization in the morning and at bedtime. 120 mL 11  ? calcium carbonate (OSCAL) 1500 (600 Ca) MG TABS tablet Take 600 mg of elemental calcium by mouth 2 (two) times daily with a meal.     ? chlorhexidine (PERIDEX) 0.12 % solution Use to wash mouth twice a day    ? Cholecalciferol (VITAMIN D3) 50 MCG (2000 UT) capsule Take 1 capsule (2,000 Units total) by mouth daily. 100 capsule 3  ? clonazePAM (KLONOPIN) 1 MG tablet TAKE 2 TABLETS BY MOUTH EVERY NIGHT AT BEDTIME AND 1/2 TABLET EXTRA IN DAYTIME AS NEEDED 225 tablet 1  ? cyanocobalamin (,VITAMIN B-12,) 1000 MCG/ML injection INJECT 1ML IN THE MUSCLE EVERY 14 DAYS. 10 mL 5  ? diphenoxylate-atropine (LOMOTIL) 2.5-0.025 MG tablet Take 1-2 tablets by mouth 4 (four) times daily as needed for diarrhea or loose stools. 60 tablet 1  ? Docusate Calcium (STOOL SOFTENER PO) Take 100 mg by mouth as needed (for constipation).     ? escitalopram (LEXAPRO) 20 MG tablet Take 1 tablet (20 mg total) by mouth daily. 90 tablet 3  ? formoterol (PERFOROMIST) 20 MCG/2ML nebulizer solution Take 2 mLs (20 mcg total) by nebulization 2 (two) times daily. 120 mL 3  ? furosemide (LASIX) 20 MG tablet TAKE 1 TABLET(20 MG) BY MOUTH DAILY AS NEEDED 90 tablet  3  ? levothyroxine (SYNTHROID) 88 MCG tablet TAKE 1 TABLET(88 MCG) BY MOUTH DAILY 90 tablet 3  ? nitroGLYCERIN (NITROSTAT) 0.4 MG SL tablet DISSOLVE 1 TABLET UNDER THE TONGUE AS DIRECTED 25 tablet 3  ? Omega-3 Fatty Acids (FISH OIL) 1000 MG CAPS Take 1 capsule by mouth daily.    ? phenytoin (DILANTIN) 100 MG ER capsule TAKE 2 CAPSULES BY MOUTH EVERY MORNING AND 1 CAPSULE EVERY EVENING 270 capsule 3  ? promethazine (PHENERGAN) 12.5 MG tablet Take by mouth.    ? Promethazine-Codeine 6.25-10 MG/5ML SOLN TAKE 5 ML BY MOUTH EVERY 6 HOURS AS NEEDED. DO NOT TAKE WITH TYLENOL 3 300 mL 0  ? Respiratory Therapy Supplies (FLUTTER) DEVI Use after nebulization treatments 1 each 0  ? sodium chloride (MURO 128) 5 % ophthalmic solution Place 1 drop into both eyes as needed for irritation.     ? SYRINGE-NEEDLE, DISP, 3 ML (BD ECLIPSE SYRINGE) 25G X 1" 3 ML MISC Use sq q 2 wks 50 each 3  ? traZODone (DESYREL) 50 MG tablet TAKE 4 TABLETS(200 MG) BY MOUTH AT BEDTIME 360 tablet 3  ? verapamil (CALAN-SR) 240 MG CR tablet TAKE 1 TABLET(240 MG) BY MOUTH AT BEDTIME 90 tablet 3  ? acetaminophen-codeine (TYLENOL #3) 300-30 MG tablet TAKE 1 TABLET BY MOUTH EVERY 6 HOURS AS  NEEDED 60 tablet 2  ? albuterol (VENTOLIN HFA) 108 (90 Base) MCG/ACT inhaler INHALE 2 PUFFS INTO THE LUNGS EVERY 4 HOURS AS NEEDED FOR WHEEZING OR SHORTNESS OF BREATH 8.5 g 0  ? cariprazine (VRAYLAR) 1.5 MG capsule Take 1 capsule (1.5 mg total) by mouth daily. Lot # [id number] EXP 0701/22 35 capsule 0  ? cariprazine (VRAYLAR) 1.5 MG capsule Take 1 capsule (1.5 mg total) by mouth daily. Fax to [facility] @ [phone] 90 capsule 3  ? levofloxacin (LEVAQUIN) 500 MG tablet Take one tab PO once daily for one week 7 tablet 0  ? ?No facility-administered medications prior to visit.  ? ? ?ROS: ?Review of Systems  ?Constitutional:  Positive for fatigue. Negative for activity change, appetite change, chills and unexpected weight change.  ?HENT:  Positive for congestion, rhinorrhea and voice  change. Negative for mouth sores and sinus pressure.   ?Eyes:  Negative for visual disturbance.  ?Respiratory:  Positive for cough, shortness of breath and wheezing. Negative for chest tightness.   ?Gastrointestinal:  Negative for abdominal pain and nausea.  ?Genitourinary:  Negative for difficulty urinating, frequency and vaginal pain.  ?Musculoskeletal:  Positive for arthralgias, back pain and gait problem.  ?Skin:  Negative for pallor and rash.  ?Neurological:  Positive for weakness. Negative for dizziness, tremors, numbness and headaches.  ?Psychiatric/Behavioral:  Negative for confusion and sleep disturbance.   ? ?Objective:  ?BP 120/78 (BP Location: Left Arm, Patient Position: Sitting, Cuff Size: Large)   Pulse 68   Temp 97.6 ?F (36.4 ?C) (Oral)   Ht 5' (1.524 m)   Wt 145 lb (65.8 kg)   SpO2 98%   BMI 28.32 kg/m?  ? ?BP Readings from Last 3 Encounters:  ?06/09/21 120/78  ?03/05/21 (!) 112/48  ?02/17/21 126/84  ? ? ?Wt Readings from Last 3 Encounters:  ?06/09/21 145 lb (65.8 kg)  ?03/05/21 141 lb (64 kg)  ?02/17/21 143 lb 12.8 oz (65.2 kg)  ? ? ?Physical Exam ?Constitutional:   ?   General: She is not in acute distress. ?   Appearance: Normal appearance. She is well-developed.  ?HENT:  ?   Head: Normocephalic.  ?   Right Ear: External ear normal.  ?   Left Ear: External ear normal.  ?   Nose: Nose normal.  ?Eyes:  ?   General:     ?   Right eye: No discharge.     ?   Left eye: No discharge.  ?   Conjunctiva/sclera: Conjunctivae normal.  ?   Pupils: Pupils are equal, round, and reactive to light.  ?Neck:  ?   Thyroid: No thyromegaly.  ?   Vascular: No JVD.  ?   Trachea: No tracheal deviation.  ?Cardiovascular:  ?   Rate and Rhythm: Normal rate and regular rhythm.  ?   Heart sounds: Normal heart sounds.  ?Pulmonary:  ?   Effort: No respiratory distress.  ?   Breath sounds: No stridor. No wheezing.  ?Abdominal:  ?   General: Bowel sounds are normal. There is no distension.  ?   Palpations: Abdomen is soft.  There is no mass.  ?   Tenderness: There is no abdominal tenderness. There is no guarding or rebound.  ?Musculoskeletal:     ?   General: Tenderness present.  ?   Cervical back: Normal range of motion and neck supple. No rigidity.  ?Lymphadenopathy:  ?   Cervical: No cervical adenopathy.  ?Skin: ?   Findings: No erythema or  rash.  ?Neurological:  ?   Mental Status: She is oriented to person, place, and time.  ?   Cranial Nerves: No cranial nerve deficit.  ?   Motor: No abnormal muscle tone.  ?   Coordination: Coordination abnormal.  ?   Gait: Gait abnormal.  ?   Deep Tendon Reflexes: Reflexes abnormal.  ?Psychiatric:     ?   Behavior: Behavior normal.     ?   Thought Content: Thought content normal.     ?   Judgment: Judgment normal.  ?Coughing ?Using a cane ? ?Lab Results  ?Component Value Date  ? WBC 10.3 03/05/2021  ? HGB 13.2 03/05/2021  ? HCT 39.7 03/05/2021  ? PLT 371.0 03/05/2021  ? GLUCOSE 91 03/05/2021  ? CHOL 249 (H) 11/10/2018  ? TRIG 88.0 11/10/2018  ? HDL 57.70 11/10/2018  ? LDLDIRECT 165.0 09/26/2009  ? LDLCALC 174 (H) 11/10/2018  ? ALT 18 03/05/2021  ? AST 24 03/05/2021  ? NA 141 03/05/2021  ? K 4.6 03/05/2021  ? CL 101 03/05/2021  ? CREATININE 0.64 03/05/2021  ? BUN 15 03/05/2021  ? CO2 35 (H) 03/05/2021  ? TSH 2.22 11/10/2018  ? INR 1.0 ratio 11/07/2009  ? HGBA1C 5.6 08/23/2019  ? ? ?MM 3D SCREEN BREAST BILATERAL ? ?Result Date: 05/13/2021 ?CLINICAL DATA:  Screening. EXAM: DIGITAL SCREENING BILATERAL MAMMOGRAM WITH TOMOSYNTHESIS AND CAD TECHNIQUE: Bilateral screening digital craniocaudal and mediolateral oblique mammograms were obtained. Bilateral screening digital breast tomosynthesis was performed. The images were evaluated with computer-aided detection. COMPARISON:  Previous exam(s). ACR Breast Density Category b: There are scattered areas of fibroglandular density. FINDINGS: There are no findings suspicious for malignancy. IMPRESSION: No mammographic evidence of malignancy. A result letter of this  screening mammogram will be mailed directly to the patient. RECOMMENDATION: Screening mammogram in one year. (Code:SM-B-01Y) BI-RADS CATEGORY  1: Negative. Electronically Signed   By: Marin Olp M.D.

## 2021-06-10 LAB — CBC WITH DIFFERENTIAL/PLATELET
Basophils Absolute: 0.1 10*3/uL (ref 0.0–0.1)
Basophils Relative: 1.3 % (ref 0.0–3.0)
Eosinophils Absolute: 0.2 10*3/uL (ref 0.0–0.7)
Eosinophils Relative: 2.1 % (ref 0.0–5.0)
HCT: 39 % (ref 36.0–46.0)
Hemoglobin: 13.2 g/dL (ref 12.0–15.0)
Lymphocytes Relative: 28.1 % (ref 12.0–46.0)
Lymphs Abs: 2.2 10*3/uL (ref 0.7–4.0)
MCHC: 33.8 g/dL (ref 30.0–36.0)
MCV: 90.4 fl (ref 78.0–100.0)
Monocytes Absolute: 1.2 10*3/uL — ABNORMAL HIGH (ref 0.1–1.0)
Monocytes Relative: 16 % — ABNORMAL HIGH (ref 3.0–12.0)
Neutro Abs: 4 10*3/uL (ref 1.4–7.7)
Neutrophils Relative %: 52.5 % (ref 43.0–77.0)
Platelets: 284 10*3/uL (ref 150.0–400.0)
RBC: 4.31 Mil/uL (ref 3.87–5.11)
RDW: 14 % (ref 11.5–15.5)
WBC: 7.7 10*3/uL (ref 4.0–10.5)

## 2021-06-10 LAB — COMPREHENSIVE METABOLIC PANEL
ALT: 11 U/L (ref 0–35)
AST: 13 U/L (ref 0–37)
Albumin: 4.1 g/dL (ref 3.5–5.2)
Alkaline Phosphatase: 116 U/L (ref 39–117)
BUN: 7 mg/dL (ref 6–23)
CO2: 30 mEq/L (ref 19–32)
Calcium: 8.5 mg/dL (ref 8.4–10.5)
Chloride: 102 mEq/L (ref 96–112)
Creatinine, Ser: 0.61 mg/dL (ref 0.40–1.20)
GFR: 89.45 mL/min (ref >60.00)
Glucose, Bld: 122 mg/dL — ABNORMAL HIGH (ref 70–99)
Potassium: 3.6 mEq/L (ref 3.5–5.1)
Sodium: 142 mEq/L (ref 135–145)
Total Bilirubin: 0.3 mg/dL (ref 0.2–1.2)
Total Protein: 6.7 g/dL (ref 6.0–8.3)

## 2021-06-10 LAB — HEMOGLOBIN A1C: Hgb A1c MFr Bld: 5.7 % (ref 4.6–6.5)

## 2021-06-10 LAB — TSH: TSH: 2.75 u[IU]/mL (ref 0.35–5.50)

## 2021-06-11 LAB — PHENYTOIN LEVEL, TOTAL: Phenytoin, Total: 8.4 mg/L — ABNORMAL LOW (ref 10.0–20.0)

## 2021-06-22 ENCOUNTER — Other Ambulatory Visit: Payer: Self-pay | Admitting: *Deleted

## 2021-06-22 ENCOUNTER — Ambulatory Visit (INDEPENDENT_AMBULATORY_CARE_PROVIDER_SITE_OTHER): Payer: Medicare Other | Admitting: Psychology

## 2021-06-22 DIAGNOSIS — F331 Major depressive disorder, recurrent, moderate: Secondary | ICD-10-CM

## 2021-06-22 NOTE — Progress Notes (Signed)
[facility] Counselor/Therapist Progress Note ? ?Patient ID: Veronica [name], MRN: [id number],   ? ?Date: 06/22/2021 ? ?Time Spent: 12:00pm - 12:45pm    45 minutes  ? ?Treatment Type: Individual Therapy ? ?Reported Symptoms: sadness, loneliness ? ?Mental Status Exam: ?Appearance:  Casual     ?Behavior: Appropriate  ?Motor: Normal  ?Speech/Language:  Normal Rate  ?Affect: Appropriate  ?Mood: normal  ?Thought process: normal  ?Thought content:   WNL  ?Sensory/Perceptual disturbances:   WNL  ?Orientation: oriented to person, place, time/date, and situation  ?Attention: Good  ?Concentration: Good  ?Memory: WNL  ?Fund of knowledge:  Good  ?Insight:   Good  ?Judgment:  Good  ?Impulse Control: Good  ? ?Risk Assessment: ?Danger to Self:  No ?Self-injurious Behavior: No ?Danger to Others: No ?Duty to Warn:no ?Physical Aggression / Violence:No  ?Access to Firearms a concern: No  ?Gang Involvement:No  ? ?Subjective: ?Pt present for individual therapy via video phone.  Pt consents to telehealth session due to [place] 19 pandemic. ?Location of pt: home ?Location of therapist: home office.   ?Pt talked about health issues.   She had to see the oral surgeon last week and had to have surgery bc of bone loss and an infection.   Pt states she has had a rough time bc she is back in pain and on liquid only diet.  Addressed how difficult this is for pt.   Pt has not been sleeping well bc of pain. ?She has hoped that her brother would provide more support but he has been struggling with his own health issues.   Addressed pt's disappointment.  Helped her process feelings and relationship dynamics.   ?Provided supportive therapy.   ? ?Interventions: Cognitive Behavioral Therapy and Insight-Oriented ? ?Diagnosis:  F33.1 ? ?Plan of Care: Recommend ongoing therapy.   Pt participated in setting therapy goals.  Pt wants to have someone to talk to and improve coping skills.   Plan to continue to meet every two weeks.   ? ?Treatment  Plan (Treatment Plan Target Date: 06/09/2022) ?Client Abilities/Strengths  ?Pt is bright, engaging, and motivated for therapy.   ?Client Treatment Preferences  ?Individual therapy.  ?Client Statement of Needs  ?Improve coping skills.  ?Symptoms  ?Depressed or irritable mood. ?Lack of energy. ?Feelings of hopelessness, worthlessness, or inappropriate guilt. ?Low self-esteem. ?Unresolved grief issues. ? ? ?Problems Addressed  ?Unipolar Depression ?Goals ?1. Alleviate depressive symptoms and return to previous level of effective functioning. ?2. Appropriately grieve the loss in order to normalize mood and to return to previously adaptive level of functioning. ?Objective ?Learn and implement behavioral strategies to overcome depression. ?Target Date: 2022-06-09 Frequency: Biweekly  ?Progress: 40 Modality: individual  ?Related Interventions ?Engage the client in "behavioral activation," increasing his/her activity level and contact with sources of reward, while identifying processes that inhibit activation.  Use behavioral techniques such as instruction, rehearsal, role-playing, role reversal, as needed, to facilitate activity in the client's daily life; reinforce success. ?Assist the client in developing skills that increase the likelihood of deriving pleasure from behavioral activation (e.g., assertiveness skills, developing an exercise plan, less internal/more external focus, increased social involvement); reinforce success. ?Objective ?Identify important people in life, past and present, and describe the quality, good and poor, of those relationships. ?Target Date: 2022-06-09 Frequency: Biweekly  ?Progress: 40 Modality: individual  ?Related Interventions ?Conduct Interpersonal Therapy beginning with the assessment of the client's "interpersonal inventory" of important past and present relationships; develop a case formulation linking depression to  grief, interpersonal role disputes, role transitions, and/or  interpersonal deficits). ?Objective ?Learn and implement problem-solving and decision-making skills. ?Target Date: 2022-06-09 Frequency: Biweekly  ?Progress: 40 Modality: individual  ?Related Interventions ?Conduct Problem-Solving Therapy using techniques such as psychoeducation, modeling, and role-playing to teach client problem-solving skills (i.e., defining a problem specifically, generating possible solutions, evaluating the pros and cons of each solution, selecting and implementing a plan of action, evaluating the efficacy of the plan, accepting or revising the plan); role-play application of the problem-solving skill to a real life issue. ?Encourage in the client the development of a positive problem orientation in which problems and solving them are viewed as a natural part of life and not something to be feared, despaired, or avoided. ?3. Develop healthy interpersonal relationships that lead to the alleviation and help prevent the relapse of depression. ?4. Develop healthy thinking patterns and beliefs about self, others, and the world that lead to the alleviation and help prevent the relapse of depression. ?5. Recognize, accept, and cope with feelings of depression. ?Diagnosis ?F33.1  ?Conditions For Discharge ?Achievement of treatment goals and objectives  ? ?Khayden Herzberg, LCSW ? ? ? ?

## 2021-06-23 ENCOUNTER — Telehealth: Payer: Self-pay | Admitting: Internal Medicine

## 2021-06-23 ENCOUNTER — Ambulatory Visit: Payer: Self-pay | Admitting: *Deleted

## 2021-06-23 MED ORDER — CARIPRAZINE HCL 1.5 MG PO CAPS: 1.5000 mg | ORAL_CAPSULE | Freq: Every day | ORAL | 4 refills | Status: DC

## 2021-06-23 NOTE — Patient Outreach (Addendum)
[facility]) Care Management ? ?06/23/2021 ? ?Jiali D Cerney ?03-24-1950 ?[id number] ? ?[facility]) Care Management ?RN Health Coach Note ? ? ?06/23/2021 ?Name:  Veronica [name] MRN:  [id number] DOB:  03-29-1950 ? ?Summary: ?Patient is concerned about not receiving a call to pick up her Vraylar medication of which she is receiving medication assistance. Nurse was able to call her PCP's office and it was reported that the medication has been ordered, should arrive soon, and the office will call the patient to come pick up the medication when it arrives. Nurse explained this to the patient who was appreciative for the information. Patient states she has had a recent small flare up with her COPD. She informed her provider, has used her rescue medications, and reports the flare up is improving. Patient continues with ongoing oral surgeries. She states she has recently lost several pounds but overall has maintained her weight by supplementing with Ensure and Boost. Nurse will send patient Ensure coupons. Patient reports her home environment is safe and confirms she has this nurses contact number to call if needed.  ? ?Recommendations/Changes made from today's visit: ?Continue to walk around your home, do chair exercises, and stay as physically active as tolerated ?Contact providers for COPD symptoms not resolving within 48 hours and as needed ?Continue to supplement with Ensure, Boost, Gatorade to maintain nutrition, weight, and hydration ?Continue to weigh 3 times a week to ensure weight is being maintained ?Try to eat soft foods as tolerated like soup, yogurt, and puree food in a blender ? ?Subjective: ?Veronica [name] is an 71 y.o. year old female who is a primary patient of Plotnikov, Evie Lacks, MD. The care management team was consulted for assistance with care management and/or care coordination needs.   ? ?RN Health Coach completed Telephone Visit today.   ? ?Objective: ? ?Medications Reviewed Today   ? ? Reviewed by Michiel Cowboy, RN (Registered Nurse) on 06/23/21 at [phone]  Med List Status: <None>  ? ?Medication Order Taking? Sig Documenting Provider Last Dose Status Informant  ?acetaminophen-codeine (TYLENOL #3) 300-30 MG tablet [id number] Yes Take 1 tablet by mouth every 6 (six) hours as needed. Plotnikov, Evie Lacks, MD Taking Active   ?albuterol (PROVENTIL) (2.5 MG/3ML) 0.083% nebulizer solution [id number] Yes USE 1 VIAL IN NEBULIZER 2 TIMES DAILY. Generic: Henri Medal, MD Taking Active   ?         ?Med Note Laretta Alstrom, Obbie Lewallen A   Thu Aug 28, 2020  1:52 PM)    ?albuterol (VENTOLIN HFA) 108 (90 Base) MCG/ACT inhaler [id number] Yes Inhale 2 puffs into the lungs every 4 (four) hours as needed for wheezing or shortness of breath. Plotnikov, Evie Lacks, MD Taking Active   ?aspirin 81 MG chewable tablet [id number] Yes Chew by mouth daily. [provider] Taking Active Self  ?atorvastatin (LIPITOR) 40 MG tablet [id number] Yes TAKE 1 TABLET(40 MG) BY MOUTH DAILY Plotnikov, Evie Lacks, MD Taking Active   ?B-D INSULIN SYRINGE 1CC/25GX1" 25G X 1" 1 ML MISC [id number] Yes USE AS DIRECTED EVERY 2 WEEKS Plotnikov, Evie Lacks, MD Taking Active   ?budesonide (PULMICORT) 0.5 MG/2ML nebulizer solution [id number] Yes Take 2 mLs (0.5 mg total) by nebulization in the morning and at bedtime. Hunsucker, Bonna Gains, MD Taking Active   ?calcium carbonate (OSCAL) 1500 (600 Ca) MG TABS tablet [id number] Yes Take 600 mg of elemental calcium by mouth 2 (two) times daily with a meal.  [provider]  Taking Active   ?cariprazine (VRAYLAR) 1.5 MG capsule [id number] Yes Take 1 capsule (1.5 mg total) by mouth daily. Fax to [facility] @ [phone] Plotnikov, Evie Lacks, MD Taking Active   ?chlorhexidine (PERIDEX) 0.12 % solution [id number] Yes Use to wash mouth twice a day [provider] Taking Active   ?Cholecalciferol (VITAMIN D3) 50 MCG (2000 UT) capsule [id number] Yes Take 1  capsule (2,000 Units total) by mouth daily. Plotnikov, Evie Lacks, MD Taking Active   ?clonazePAM (KLONOPIN) 1 MG tablet [id number] Yes TAKE 2 TABLETS BY MOUTH EVERY NIGHT AT BEDTIME AND 1/2 TABLET EXTRA IN DAYTIME AS NEEDED Plotnikov, Evie Lacks, MD Taking Active   ?cyanocobalamin (,VITAMIN B-12,) 1000 MCG/ML injection [id number] Yes INJECT 1ML IN THE MUSCLE EVERY 14 DAYS. Plotnikov, Evie Lacks, MD Taking Active   ?diphenoxylate-atropine (LOMOTIL) 2.5-0.025 MG tablet [id number] Yes Take 1-2 tablets by mouth 4 (four) times daily as needed for diarrhea or loose stools. Plotnikov, Evie Lacks, MD Taking Active   ?Docusate Calcium (STOOL SOFTENER PO) [id number] Yes Take 100 mg by mouth as needed (for constipation).  [provider] Taking Active Self  ?         ?Med Note Page [facility]   Fri Nov 28, 2020 12:33 PM)    ?escitalopram (LEXAPRO) 20 MG tablet [id number] Yes Take 1 tablet (20 mg total) by mouth daily. Plotnikov, Evie Lacks, MD Taking Active   ?formoterol (PERFOROMIST) 20 MCG/2ML nebulizer solution [id number] Yes Take 2 mLs (20 mcg total) by nebulization 2 (two) times daily. Martyn Ehrich, NP Taking Active Self  ?furosemide (LASIX) 20 MG tablet [id number] Yes TAKE 1 TABLET(20 MG) BY MOUTH DAILY AS NEEDED Plotnikov, Evie Lacks, MD Taking Active   ?levofloxacin (LEVAQUIN) 500 MG tablet [id number] Yes Take 500 mg by mouth daily. [provider] Taking Active   ?levothyroxine (SYNTHROID) 88 MCG tablet [id number] Yes TAKE 1 TABLET(88 MCG) BY MOUTH DAILY Plotnikov, Evie Lacks, MD Taking Active   ?nitroGLYCERIN (NITROSTAT) 0.4 MG SL tablet [id number] Yes DISSOLVE 1 TABLET UNDER THE TONGUE AS DIRECTED Plotnikov, Evie Lacks, MD Taking Active   ?Omega-3 Fatty Acids (FISH OIL) 1000 MG CAPS [id number] Yes Take 1 capsule by mouth daily. [provider] Taking Active   ?phenytoin (DILANTIN) 100 MG ER capsule [id number] Yes TAKE 2 CAPSULES BY MOUTH EVERY MORNING AND 1 CAPSULE EVERY EVENING Plotnikov, Evie Lacks, MD Taking Active   ?promethazine (PHENERGAN) 12.5 MG tablet [id number] Yes Take by mouth. [provider] Taking Active   ?Promethazine-Codeine 6.25-10 MG/5ML SOLN [id number] Yes TAKE 5 ML BY MOUTH EVERY 6 HOURS AS NEEDED. DO NOT TAKE WITH TYLENOL 3 Plotnikov, Evie Lacks, MD Taking Active   ?Respiratory Therapy Supplies (FLUTTER) DEVI [id number] Yes Use after nebulization treatments Javier Glazier, MD Taking Active   ?sodium chloride (MURO 128) 5 % ophthalmic solution [id number] Yes Place 1 drop into both eyes as needed for irritation.  [provider] Taking Active Self  ?         ?Med Note (YOPP, AMBER C   Tue Jul 10, 2019  1:42 PM) She is using daily.  ?SYRINGE-NEEDLE, DISP, 3 ML (BD ECLIPSE SYRINGE) 25G X 1" 3 ML MISC [id number] Yes Use sq q 2 wks Plotnikov, Evie Lacks, MD Taking Active   ?traZODone (DESYREL) 50 MG tablet [id number] Yes TAKE 4 TABLETS(200 MG) BY MOUTH AT BEDTIME Plotnikov, Evie Lacks, MD Taking Active   ?verapamil (CALAN-SR) 240 MG CR tablet [id number] Yes TAKE 1 TABLET(240 MG) BY MOUTH AT  BEDTIME Plotnikov, Evie Lacks, MD Taking Active   ? ?  ?  ? ?  ? ? ? ?SDOH:  (Social Determinants of Health) assessments and interventions performed:  ?SDOH Interventions   ? ?Flowsheet Row Most Recent Value  ?SDOH Interventions   ?Depression Interventions/Treatment  Medication, Counseling  [receives counseling every 2 weeks]  ? ?  ? ? ?Care Plan ? ?Review of patient past medical history, allergies, medications, health status, including review of consultants reports, laboratory and other test data, was performed as part of comprehensive evaluation for care management services.  ? ?Care Plan : COPD (Adult)  ?Updates made by Michiel Cowboy, RN since 06/23/2021 12:00 AM  ?  ? ?Problem: Disease Progression (COPD) Resolved 06/23/2021  ?Priority: High  ?  ? ?Problem: Symptom Exacerbation (COPD) Resolved 06/23/2021  ?Priority: High  ?  ? ?Care Plan : RN Care Manager Plan of Care  ?Updates made by Michiel Cowboy, RN since 06/23/2021 12:00 AM  ?  ? ?Problem: Knowledge Deficit Related to COPD and Maintaining Weight during ongoing oral surgeries   ?Priority: High  ?  ? ?Long-Range Goal: Development of Plan of Care for Management of COPD an

## 2021-06-23 NOTE — Patient Instructions (Addendum)
Visit Information ? ?Thank you for taking time to visit with me today. Please don't hesitate to contact me if I can be of assistance to you before our next scheduled telephone appointment. ? ?Following are the goals we discussed today:  ? ?Take medications as prescribed   ?Attend all scheduled provider appointments ?Call provider office for new concerns or questions  ?identify and avoid work-related triggers ?identify and remove indoor air pollutants ?limit outdoor activity during cold weather ?do breathing exercises every day ?eliminate symptom triggers at home ?follow rescue plan if symptoms flare-up ?use an extra pillow to sleep ?use devices that will help like a cane, sock-puller or reacher ?do breathing exercises every day ?Continue to walk around your home, do chair exercises, and stay as physically active as tolerated ?Contact providers for COPD symptoms not resolving within 48 hours and as needed ?Continue to supplement with Ensure, Boost, Gatorade to maintain nutrition, weight, and hydration ?Continue to weigh 3 times a week to ensure weight is being maintained ?Try to eat soft foods as tolerated like soup, yogurt, and puree food in a blender ? ? ?The patient verbalized understanding of instructions, educational materials, and care plan provided today and agreed to receive a mailed copy of patient instructions, educational materials, and care plan.  ? ?Telephone follow up appointment with care management team member scheduled for: May ? ?Emelia Loron RN, BSN ?Nurse Health Coach ?[place] ?[phone] ?[email or web address] ? ? ?  ?

## 2021-06-23 NOTE — Telephone Encounter (Signed)
Called pt back pt assistance hasn't came in yet.. will leave samples of Vrayler 1.5 mg 4 boxes up front for pick-up..Chryl Heck ?

## 2021-06-23 NOTE — Telephone Encounter (Signed)
Sharee Pimple from [employer] called and states she wanted an update on Patient Assistance Medication Dietitian.  ? ?Sent message to [place]. Pt will be contacted once medication has arrived.  ? ?Message relayed to [place]. Expressed understanding and states pt have about a week left of medication and will be in a "tizzy" if she runs out, as it supports her mental status.  ? ? ?

## 2021-06-26 NOTE — Telephone Encounter (Signed)
Rec'd pt pt assistance for Vrayler. Called pt no answer LMOM samples has came into the office will leave up front for pick-up next week. Customer # [phone], 3 bottles.Marland KitchenJohny Chess ?

## 2021-06-30 NOTE — Telephone Encounter (Signed)
Pt archived in [email or web address].  Please advise if patient and/or provider wish to proceed with [facility] therpay.  

## 2021-07-06 ENCOUNTER — Ambulatory Visit (INDEPENDENT_AMBULATORY_CARE_PROVIDER_SITE_OTHER): Payer: Medicare Other | Admitting: Psychology

## 2021-07-06 DIAGNOSIS — F331 Major depressive disorder, recurrent, moderate: Secondary | ICD-10-CM | POA: Diagnosis not present

## 2021-07-06 NOTE — Progress Notes (Signed)
[place] Counselor/Therapist Progress Note ? ?Patient ID: Veronica [name], MRN: [id number],   ? ?Date: 07/06/2021 ? ?Time Spent: 12:00pm - 12:45pm    45 minutes  ? ?Treatment Type: Individual Therapy ? ?Reported Symptoms: sadness, loneliness ? ?Mental Status Exam: ?Appearance:  Casual     ?Behavior: Appropriate  ?Motor: Normal  ?Speech/Language:  Normal Rate  ?Affect: Appropriate  ?Mood: normal  ?Thought process: normal  ?Thought content:   WNL  ?Sensory/Perceptual disturbances:   WNL  ?Orientation: oriented to person, place, time/date, and situation  ?Attention: Good  ?Concentration: Good  ?Memory: WNL  ?Fund of knowledge:  Good  ?Insight:   Good  ?Judgment:  Good  ?Impulse Control: Good  ? ?Risk Assessment: ?Danger to Self:  No ?Self-injurious Behavior: No ?Danger to Others: No ?Duty to Warn:no ?Physical Aggression / Violence:No  ?Access to Firearms a concern: No  ?Gang Involvement:No  ? ?Subjective: ?Pt present for individual therapy via  phone.  Pt consents to telehealth session due to [place] 19 pandemic. ?Location of pt: home ?Location of therapist: home office.   ?Pt talked about health issues.   She had additional oral surgery last week and is still recovering.   It was a harder process than expected.   Pt states she has had a rough time bc she is back in pain and on liquid only diet.  Addressed how difficult this is for pt.  Worked on coping strategies.   ?She has hoped that her brother would provide more support but he has been struggling with his own health issues.   Addressed pt's disappointment.  Worked on how she can depersonalize his responses.  Helped her process feelings and relationship dynamics.   ?Provided supportive therapy.   ? ?Interventions: Cognitive Behavioral Therapy and Insight-Oriented ? ?Diagnosis:  F33.1 ? ?Plan of Care: Recommend ongoing therapy.   Pt participated in setting therapy goals.  Pt wants to have someone to talk to and improve coping skills.   Plan to  continue to meet every two weeks.   ? ?Treatment Plan (Treatment Plan Target Date: 06/09/2022) ?Client Abilities/Strengths  ?Pt is bright, engaging, and motivated for therapy.   ?Client Treatment Preferences  ?Individual therapy.  ?Client Statement of Needs  ?Improve coping skills.  ?Symptoms  ?Depressed or irritable mood. ?Lack of energy. ?Feelings of hopelessness, worthlessness, or inappropriate guilt. ?Low self-esteem. ?Unresolved grief issues. ? ? ?Problems Addressed  ?Unipolar Depression ?Goals ?1. Alleviate depressive symptoms and return to previous level of effective functioning. ?2. Appropriately grieve the loss in order to normalize mood and to return to previously adaptive level of functioning. ?Objective ?Learn and implement behavioral strategies to overcome depression. ?Target Date: 2022-06-09 Frequency: Biweekly  ?Progress: 40 Modality: individual  ?Related Interventions ?Engage the client in "behavioral activation," increasing his/her activity level and contact with sources of reward, while identifying processes that inhibit activation.  Use behavioral techniques such as instruction, rehearsal, role-playing, role reversal, as needed, to facilitate activity in the client's daily life; reinforce success. ?Assist the client in developing skills that increase the likelihood of deriving pleasure from behavioral activation (e.g., assertiveness skills, developing an exercise plan, less internal/more external focus, increased social involvement); reinforce success. ?Objective ?Identify important people in life, past and present, and describe the quality, good and poor, of those relationships. ?Target Date: 2022-06-09 Frequency: Biweekly  ?Progress: 40 Modality: individual  ?Related Interventions ?Conduct Interpersonal Therapy beginning with the assessment of the client's "interpersonal inventory" of important past and present relationships; develop a case  formulation linking depression to grief, interpersonal  role disputes, role transitions, and/or interpersonal deficits). ?Objective ?Learn and implement problem-solving and decision-making skills. ?Target Date: 2022-06-09 Frequency: Biweekly  ?Progress: 40 Modality: individual  ?Related Interventions ?Conduct Problem-Solving Therapy using techniques such as psychoeducation, modeling, and role-playing to teach client problem-solving skills (i.e., defining a problem specifically, generating possible solutions, evaluating the pros and cons of each solution, selecting and implementing a plan of action, evaluating the efficacy of the plan, accepting or revising the plan); role-play application of the problem-solving skill to a real life issue. ?Encourage in the client the development of a positive problem orientation in which problems and solving them are viewed as a natural part of life and not something to be feared, despaired, or avoided. ?3. Develop healthy interpersonal relationships that lead to the alleviation and help prevent the relapse of depression. ?4. Develop healthy thinking patterns and beliefs about self, others, and the world that lead to the alleviation and help prevent the relapse of depression. ?5. Recognize, accept, and cope with feelings of depression. ?Diagnosis ?F33.1  ?Conditions For Discharge ?Achievement of treatment goals and objectives  ? ?Sequoia Witz, LCSW ? ? ? ?

## 2021-07-09 ENCOUNTER — Ambulatory Visit
Admission: RE | Admit: 2021-07-09 | Discharge: 2021-07-09 | Disposition: A | Discharge status: Home / Self Care | Payer: Medicare Other | Source: Ambulatory Visit | Attending: Internal Medicine | Admitting: Internal Medicine

## 2021-07-09 DIAGNOSIS — M810 Age-related osteoporosis without current pathological fracture: Secondary | ICD-10-CM

## 2021-07-20 ENCOUNTER — Ambulatory Visit (INDEPENDENT_AMBULATORY_CARE_PROVIDER_SITE_OTHER): Payer: Medicare Other | Admitting: Psychology

## 2021-07-20 DIAGNOSIS — F331 Major depressive disorder, recurrent, moderate: Secondary | ICD-10-CM | POA: Diagnosis not present

## 2021-07-20 NOTE — Progress Notes (Signed)
[facility] Counselor/Therapist Progress Note ? ?Patient ID: Veronica [name], MRN: [id number],   ? ?Date: 07/20/2021 ? ?Time Spent: 12:00pm - 12:45pm    45 minutes  ? ?Treatment Type: Individual Therapy ? ?Reported Symptoms: sadness, loneliness ? ?Mental Status Exam: ?Appearance:  Casual     ?Behavior: Appropriate  ?Motor: Normal  ?Speech/Language:  Normal Rate  ?Affect: Appropriate  ?Mood: normal  ?Thought process: normal  ?Thought content:   WNL  ?Sensory/Perceptual disturbances:   WNL  ?Orientation: oriented to person, place, time/date, and situation  ?Attention: Good  ?Concentration: Good  ?Memory: WNL  ?Fund of knowledge:  Good  ?Insight:   Good  ?Judgment:  Good  ?Impulse Control: Good  ? ?Risk Assessment: ?Danger to Self:  No ?Self-injurious Behavior: No ?Danger to Others: No ?Duty to Warn:no ?Physical Aggression / Violence:No  ?Access to Firearms a concern: No  ?Gang Involvement:No  ? ?Subjective: ?Pt present for individual therapy via  phone.  Pt consents to telehealth session due to [place] 19 pandemic. ?Location of pt: home ?Location of therapist: home office.   ?Pt talked about her health.   She needs to have some work done on her implants next week.    Pt's dental issues have been going on for a year and a half so it has been very stressful.  Pt got her bone density test back and it was not good.  She has osteoporosis.  Addressed pt's health concerns.    ?Worked on coping strategies.   ?Pt talked about her neighbor crying bc her dog died.  Pt reached out to her and got her a gift.   ?Provided supportive therapy.   ? ?Interventions: Cognitive Behavioral Therapy and Insight-Oriented ? ?Diagnosis:  F33.1 ? ?Plan of Care: Recommend ongoing therapy.   Pt participated in setting therapy goals.  Pt wants to have someone to talk to and improve coping skills.   Plan to continue to meet every two weeks.   ? ?Treatment Plan (Treatment Plan Target Date: 06/09/2022) ?Client Abilities/Strengths  ?Pt is  bright, engaging, and motivated for therapy.   ?Client Treatment Preferences  ?Individual therapy.  ?Client Statement of Needs  ?Improve coping skills.  ?Symptoms  ?Depressed or irritable mood. ?Lack of energy. ?Feelings of hopelessness, worthlessness, or inappropriate guilt. ?Low self-esteem. ?Unresolved grief issues. ? ? ?Problems Addressed  ?Unipolar Depression ?Goals ?1. Alleviate depressive symptoms and return to previous level of effective functioning. ?2. Appropriately grieve the loss in order to normalize mood and to return to previously adaptive level of functioning. ?Objective ?Learn and implement behavioral strategies to overcome depression. ?Target Date: 2022-06-09 Frequency: Biweekly  ?Progress: 40 Modality: individual  ?Related Interventions ?Engage the client in "behavioral activation," increasing his/her activity level and contact with sources of reward, while identifying processes that inhibit activation.  Use behavioral techniques such as instruction, rehearsal, role-playing, role reversal, as needed, to facilitate activity in the client's daily life; reinforce success. ?Assist the client in developing skills that increase the likelihood of deriving pleasure from behavioral activation (e.g., assertiveness skills, developing an exercise plan, less internal/more external focus, increased social involvement); reinforce success. ?Objective ?Identify important people in life, past and present, and describe the quality, good and poor, of those relationships. ?Target Date: 2022-06-09 Frequency: Biweekly  ?Progress: 40 Modality: individual  ?Related Interventions ?Conduct Interpersonal Therapy beginning with the assessment of the client's "interpersonal inventory" of important past and present relationships; develop a case formulation linking depression to grief, interpersonal role disputes, role transitions, and/or interpersonal  deficits). ?Objective ?Learn and implement problem-solving and  decision-making skills. ?Target Date: 2022-06-09 Frequency: Biweekly  ?Progress: 40 Modality: individual  ?Related Interventions ?Conduct Problem-Solving Therapy using techniques such as psychoeducation, modeling, and role-playing to teach client problem-solving skills (i.e., defining a problem specifically, generating possible solutions, evaluating the pros and cons of each solution, selecting and implementing a plan of action, evaluating the efficacy of the plan, accepting or revising the plan); role-play application of the problem-solving skill to a real life issue. ?Encourage in the client the development of a positive problem orientation in which problems and solving them are viewed as a natural part of life and not something to be feared, despaired, or avoided. ?3. Develop healthy interpersonal relationships that lead to the alleviation and help prevent the relapse of depression. ?4. Develop healthy thinking patterns and beliefs about self, others, and the world that lead to the alleviation and help prevent the relapse of depression. ?5. Recognize, accept, and cope with feelings of depression. ?Diagnosis ?F33.1  ?Conditions For Discharge ?Achievement of treatment goals and objectives  ? ?Zora Glendenning, LCSW ? ? ? ? ? ? ? ? ? ? ? ? ? ? ? ? ? ?[place] Maulden, LCSW ?

## 2021-07-21 ENCOUNTER — Other Ambulatory Visit: Payer: Self-pay | Admitting: *Deleted

## 2021-07-21 NOTE — Patient Outreach (Signed)
[facility]) Care Management ? ?07/21/2021 ? ?Rache D Secrist ?03-Jul-1950 ?[id number] ? ?[facility]) Care Management ?RN Health Coach Note ? ? ?07/21/2021 ?Name:  Veronica [name] MRN:  [id number] DOB:  08-20-1950 ? ?Summary: ?Patient reports she is doing fairly well. She has been congested the last several days due to allergies and explains that she feels this is improving. Discussed limiting her exposure to the outside during high pollen days and frequent handwashing. Patient has what she hopes to be her last oral surgery on 07/28/21. She is maintaining her weight and continues to drink nutritional supplements. Patient did not have any further questions or concerns today and did confirm that she has this nurse's contact number to call her if needed.  ? ?Recommendations/Changes made from today's visit: ?Contact providers for COPD symptoms not resolving within 48 hours and as needed ?Continue to supplement with Ensure, Boost, Gatorade to maintain nutrition, weight, and hydration ?Continue to weigh 3 times a week to ensure weight is being maintained ?Try to eat soft foods as tolerated like soup, yogurt, and puree food in a blender ?Limit going outside during high pollen day; wash hands frequently ? ?Subjective: ?Veronica [name] is an 71 y.o. year old female who is a primary patient of Plotnikov, Evie Lacks, MD. The care management team was consulted for assistance with care management and/or care coordination needs.   ? ?RN Health Coach completed Telephone Visit today.  ? ?Objective: ? ?Medications Reviewed Today   ? ? Reviewed by Michiel Cowboy, RN (Registered Nurse) on 06/23/21 at [phone]  Med List Status: <None>  ? ?Medication Order Taking? Sig Documenting Provider Last Dose Status Informant  ?acetaminophen-codeine (TYLENOL #3) 300-30 MG tablet [id number] Yes Take 1 tablet by mouth every 6 (six) hours as needed. Plotnikov, Evie Lacks, MD Taking Active   ?albuterol (PROVENTIL) (2.5 MG/3ML)  0.083% nebulizer solution [id number] Yes USE 1 VIAL IN NEBULIZER 2 TIMES DAILY. Generic: Henri Medal, MD Taking Active   ?         ?Med Note Laretta Alstrom, Jawara Latorre A   Thu Aug 28, 2020  1:52 PM)    ?albuterol (VENTOLIN HFA) 108 (90 Base) MCG/ACT inhaler [id number] Yes Inhale 2 puffs into the lungs every 4 (four) hours as needed for wheezing or shortness of breath. Plotnikov, Evie Lacks, MD Taking Active   ?aspirin 81 MG chewable tablet [id number] Yes Chew by mouth daily. [provider] Taking Active Self  ?atorvastatin (LIPITOR) 40 MG tablet [id number] Yes TAKE 1 TABLET(40 MG) BY MOUTH DAILY Plotnikov, Evie Lacks, MD Taking Active   ?B-D INSULIN SYRINGE 1CC/25GX1" 25G X 1" 1 ML MISC [id number] Yes USE AS DIRECTED EVERY 2 WEEKS Plotnikov, Evie Lacks, MD Taking Active   ?budesonide (PULMICORT) 0.5 MG/2ML nebulizer solution [id number] Yes Take 2 mLs (0.5 mg total) by nebulization in the morning and at bedtime. Hunsucker, Bonna Gains, MD Taking Active   ?calcium carbonate (OSCAL) 1500 (600 Ca) MG TABS tablet [id number] Yes Take 600 mg of elemental calcium by mouth 2 (two) times daily with a meal.  [provider] Taking Active   ?cariprazine (VRAYLAR) 1.5 MG capsule [id number] Yes Take 1 capsule (1.5 mg total) by mouth daily. Fax to [facility] @ [phone] Plotnikov, Evie Lacks, MD Taking Active   ?chlorhexidine (PERIDEX) 0.12 % solution [id number] Yes Use to wash mouth twice a day [provider] Taking Active   ?Cholecalciferol (VITAMIN D3) 50 MCG (2000 UT) capsule [id number] Yes Take 1 capsule (  2,000 Units total) by mouth daily. Plotnikov, Evie Lacks, MD Taking Active   ?clonazePAM (KLONOPIN) 1 MG tablet [id number] Yes TAKE 2 TABLETS BY MOUTH EVERY NIGHT AT BEDTIME AND 1/2 TABLET EXTRA IN DAYTIME AS NEEDED Plotnikov, Evie Lacks, MD Taking Active   ?cyanocobalamin (,VITAMIN B-12,) 1000 MCG/ML injection [id number] Yes INJECT 1ML IN THE MUSCLE EVERY 14 DAYS. Plotnikov, Evie Lacks, MD Taking Active    ?diphenoxylate-atropine (LOMOTIL) 2.5-0.025 MG tablet [id number] Yes Take 1-2 tablets by mouth 4 (four) times daily as needed for diarrhea or loose stools. Plotnikov, Evie Lacks, MD Taking Active   ?Docusate Calcium (STOOL SOFTENER PO) [id number] Yes Take 100 mg by mouth as needed (for constipation).  [provider] Taking Active Self  ?         ?Med Note Page [facility]   Fri Nov 28, 2020 12:33 PM)    ?escitalopram (LEXAPRO) 20 MG tablet [id number] Yes Take 1 tablet (20 mg total) by mouth daily. Plotnikov, Evie Lacks, MD Taking Active   ?formoterol (PERFOROMIST) 20 MCG/2ML nebulizer solution [id number] Yes Take 2 mLs (20 mcg total) by nebulization 2 (two) times daily. Martyn Ehrich, NP Taking Active Self  ?furosemide (LASIX) 20 MG tablet [id number] Yes TAKE 1 TABLET(20 MG) BY MOUTH DAILY AS NEEDED Plotnikov, Evie Lacks, MD Taking Active   ?levofloxacin (LEVAQUIN) 500 MG tablet [id number] Yes Take 500 mg by mouth daily. [provider] Taking Active   ?levothyroxine (SYNTHROID) 88 MCG tablet [id number] Yes TAKE 1 TABLET(88 MCG) BY MOUTH DAILY Plotnikov, Evie Lacks, MD Taking Active   ?nitroGLYCERIN (NITROSTAT) 0.4 MG SL tablet [id number] Yes DISSOLVE 1 TABLET UNDER THE TONGUE AS DIRECTED Plotnikov, Evie Lacks, MD Taking Active   ?Omega-3 Fatty Acids (FISH OIL) 1000 MG CAPS [id number] Yes Take 1 capsule by mouth daily. [provider] Taking Active   ?phenytoin (DILANTIN) 100 MG ER capsule [id number] Yes TAKE 2 CAPSULES BY MOUTH EVERY MORNING AND 1 CAPSULE EVERY EVENING Plotnikov, Evie Lacks, MD Taking Active   ?promethazine (PHENERGAN) 12.5 MG tablet [id number] Yes Take by mouth. [provider] Taking Active   ?Promethazine-Codeine 6.25-10 MG/5ML SOLN [id number] Yes TAKE 5 ML BY MOUTH EVERY 6 HOURS AS NEEDED. DO NOT TAKE WITH TYLENOL 3 Plotnikov, Evie Lacks, MD Taking Active   ?Respiratory Therapy Supplies (FLUTTER) DEVI [id number] Yes Use after nebulization treatments Javier Glazier,  MD Taking Active   ?sodium chloride (MURO 128) 5 % ophthalmic solution [id number] Yes Place 1 drop into both eyes as needed for irritation.  [provider] Taking Active Self  ?         ?Med Note (YOPP, AMBER C   Tue Jul 10, 2019  1:42 PM) She is using daily.  ?SYRINGE-NEEDLE, DISP, 3 ML (BD ECLIPSE SYRINGE) 25G X 1" 3 ML MISC [id number] Yes Use sq q 2 wks Plotnikov, Evie Lacks, MD Taking Active   ?traZODone (DESYREL) 50 MG tablet [id number] Yes TAKE 4 TABLETS(200 MG) BY MOUTH AT BEDTIME Plotnikov, Evie Lacks, MD Taking Active   ?verapamil (CALAN-SR) 240 MG CR tablet [id number] Yes TAKE 1 TABLET(240 MG) BY MOUTH AT BEDTIME Plotnikov, Evie Lacks, MD Taking Active   ? ?  ?  ? ?  ? ? ? ?SDOH:  (Social Determinants of Health) assessments and interventions performed: SDOH assessments completed today and documented in the Epic system. ? ?Care Plan ? ?Review of patient past medical history, allergies, medications, health status, including review of consultants reports, laboratory and other test data, was performed as  part of comprehensive evaluation for care management services.  ? ?Care Plan : RN Care Manager Plan of Care  ?Updates made by Michiel Cowboy, RN since 07/21/2021 12:00 AM  ?  ? ?Problem: Knowledge Deficit Related to COPD and Maintaining Weight during ongoing oral surgeries   ?Priority: High  ?  ? ?Long-Range Goal: Development of Plan of Care for Management of COPD and Maintaining Weight During Ongoing Oral Surgeries   ?Start Date: 01/27/2021  ?Expected End Date: 02/11/2022  ?Priority: High  ?Note:   ?Current Barriers:  ?Chronic Disease Management support and education needs related to COPD ?Ongoing oral surgeries which cause it to be difficult to maintain nutrition and weight ? ?RNCM Clinical Goal(s):  ?Patient will verbalize understanding of plan for management of COPD ?take all medications exactly as prescribed and will call provider for medication related questions ?demonstrate Ongoing health management  independence with managing treatment of COPD ?continue to work with RN Care Manager to address care management and care coordination needs related to  COPD and management of maintaining weight and nutrition during continu

## 2021-07-21 NOTE — Patient Instructions (Signed)
Visit Information ? ?Thank you for taking time to visit with me today. Please don't hesitate to contact me if I can be of assistance to you before our next scheduled telephone appointment. ? ?Following are the goals we discussed today:  ? ?Take medications as prescribed   ?Attend all scheduled provider appointments ?Call provider office for new concerns or questions  ?identify and avoid work-related triggers ?identify and remove indoor air pollutants ?limit outdoor activity during cold weather ?do breathing exercises every day ?eliminate symptom triggers at home ?follow rescue plan if symptoms flare-up ?use an extra pillow to sleep ?use devices that will help like a cane, sock-puller or reacher ?do breathing exercises every day ?Continue to walk around your home, do chair exercises, and stay as physically active as tolerated ?Contact providers for COPD symptoms not resolving within 48 hours and as needed ?Continue to supplement with Ensure, Boost, Gatorade to maintain nutrition, weight, and hydration ?Continue to weigh 3 times a week to ensure weight is being maintained ?Try to eat soft foods as tolerated like soup, yogurt, and puree food in a blender ?Limit going outside during high pollen day; wash hands frequently ? ?The patient verbalized understanding of instructions, educational materials, and care plan provided today and agreed to receive a mailed copy of patient instructions, educational materials, and care plan.  ? ?Telephone follow up appointment with care management team member scheduled for: May ? ?Emelia Loron RN, BSN ?Nurse Health Coach ?[place] ?[phone] ?[email or web address] ? ? ?  ?

## 2021-07-29 ENCOUNTER — Other Ambulatory Visit: Payer: Self-pay | Admitting: *Deleted

## 2021-07-29 NOTE — Patient Outreach (Signed)
[facility]) Care Management ? ?07/29/2021 ? ?Veronica [name] ?1951-02-27 ?[id number] ? ?Successful telephone outreach call to patient. HIPAA identifiers obtained. Nurse called patient to ensure she was recovering well from her oral surgery she had yesterday. Patient states her pain level is about a 5 and she is taking her prescribed medication. She is applying ice bags to the swollen areas of her face. Patient reports staying hydrated and she is supplementing with Boost/Ensure. She explains that she will slowly progress to soft foods as tolerated. Patient is rinsing her mouth with chlorhexidine twice a day to prevent infection. She reports that she is doing well and her home environment is safe.  ? ?Plan: RN Health Coach will call patient within the month of July. ? ?Emelia Loron RN, BSN ?[facility] Care Management  ?RN Health Coach ?[phone] ?[email or web address] ? ? ? ?

## 2021-08-03 ENCOUNTER — Ambulatory Visit (INDEPENDENT_AMBULATORY_CARE_PROVIDER_SITE_OTHER): Payer: Medicare Other | Admitting: Psychology

## 2021-08-03 DIAGNOSIS — F331 Major depressive disorder, recurrent, moderate: Secondary | ICD-10-CM

## 2021-08-03 NOTE — Progress Notes (Signed)
[facility] Counselor/Therapist Progress Note  Patient ID: Veronica [name], MRN: [id number],    Date: 08/03/2021  Time Spent: 12:00pm - 12:45pm    45 minutes   Treatment Type: Individual Therapy  Reported Symptoms: sadness, loneliness  Mental Status Exam: Appearance:  Casual     Behavior: Appropriate  Motor: Normal  Speech/Language:  Normal Rate  Affect: Appropriate  Mood: normal  Thought process: normal  Thought content:   WNL  Sensory/Perceptual disturbances:   WNL  Orientation: oriented to person, place, time/date, and situation  Attention: Good  Concentration: Good  Memory: WNL  Fund of knowledge:  Good  Insight:   Good  Judgment:  Good  Impulse Control: Good   Risk Assessment: Danger to Self:  No Self-injurious Behavior: No Danger to Others: No Duty to Warn:no Physical Aggression / Violence:No  Access to Firearms a concern: No  Gang Involvement:No   Subjective: Pt present for individual therapy via  phone.  Pt consents to telehealth session due to [place] 19 pandemic. Location of pt: home Location of therapist: home office.   Pt talked about her health.    She had the implant surgery.  She states she "made it through it".   She ended up with some complications that had to be addressed.   Pt states she feels very exhausted from dealing with all of the oral surgery and denture issues.  She feels like she does not have energy and the feelings of coping with the health issues for so long has "caught up with her."  Addressed pt's health concerns.    Worked on coping and self care strategies.   Provided supportive therapy.    Interventions: Cognitive Behavioral Therapy and Insight-Oriented  Diagnosis:  F33.1  Plan of Care: Recommend ongoing therapy.   Pt participated in setting therapy goals.  Pt wants to have someone to talk to and improve coping skills.   Plan to continue to meet every two weeks.   Pt is progressing toward treatment goals.    Treatment Plan (Treatment Plan Target Date: 06/09/2022) Client Abilities/Strengths  Pt is bright, engaging, and motivated for therapy.   Client Treatment Preferences  Individual therapy.  Client Statement of Needs  Improve coping skills.  Symptoms  Depressed or irritable mood. Lack of energy. Feelings of hopelessness, worthlessness, or inappropriate guilt. Low self-esteem. Unresolved grief issues.   Problems Addressed  Unipolar Depression Goals 1. Alleviate depressive symptoms and return to previous level of effective functioning. 2. Appropriately grieve the loss in order to normalize mood and to return to previously adaptive level of functioning. Objective Learn and implement behavioral strategies to overcome depression. Target Date: 2022-06-09 Frequency: Biweekly  Progress: 40 Modality: individual  Related Interventions Engage the client in "behavioral activation," increasing his/her activity level and contact with sources of reward, while identifying processes that inhibit activation.  Use behavioral techniques such as instruction, rehearsal, role-playing, role reversal, as needed, to facilitate activity in the client's daily life; reinforce success. Assist the client in developing skills that increase the likelihood of deriving pleasure from behavioral activation (e.g., assertiveness skills, developing an exercise plan, less internal/more external focus, increased social involvement); reinforce success. Objective Identify important people in life, past and present, and describe the quality, good and poor, of those relationships. Target Date: 2022-06-09 Frequency: Biweekly  Progress: 40 Modality: individual  Related Interventions Conduct Interpersonal Therapy beginning with the assessment of the client's "interpersonal inventory" of important past and present relationships; develop a case formulation linking depression to  grief, interpersonal role disputes, role transitions,  and/or interpersonal deficits). Objective Learn and implement problem-solving and decision-making skills. Target Date: 2022-06-09 Frequency: Biweekly  Progress: 40 Modality: individual  Related Interventions Conduct Problem-Solving Therapy using techniques such as psychoeducation, modeling, and role-playing to teach client problem-solving skills (i.e., defining a problem specifically, generating possible solutions, evaluating the pros and cons of each solution, selecting and implementing a plan of action, evaluating the efficacy of the plan, accepting or revising the plan); role-play application of the problem-solving skill to a real life issue. Encourage in the client the development of a positive problem orientation in which problems and solving them are viewed as a natural part of life and not something to be feared, despaired, or avoided. 3. Develop healthy interpersonal relationships that lead to the alleviation and help prevent the relapse of depression. 4. Develop healthy thinking patterns and beliefs about self, others, and the world that lead to the alleviation and help prevent the relapse of depression. 5. Recognize, accept, and cope with feelings of depression. Diagnosis F33.1  Conditions For Discharge Achievement of treatment goals and objectives   Clint Bolder, LCSW

## 2021-08-14 ENCOUNTER — Other Ambulatory Visit: Payer: Self-pay | Admitting: Internal Medicine

## 2021-08-15 ENCOUNTER — Other Ambulatory Visit: Payer: Self-pay | Admitting: Internal Medicine

## 2021-08-17 ENCOUNTER — Other Ambulatory Visit: Payer: Self-pay | Admitting: Pulmonary Disease

## 2021-08-17 ENCOUNTER — Ambulatory Visit (INDEPENDENT_AMBULATORY_CARE_PROVIDER_SITE_OTHER): Payer: Medicare Other | Admitting: Psychology

## 2021-08-17 DIAGNOSIS — F331 Major depressive disorder, recurrent, moderate: Secondary | ICD-10-CM | POA: Diagnosis not present

## 2021-08-17 NOTE — Progress Notes (Signed)
[facility] Counselor/Therapist Progress Note  Patient ID: Veronica [name], MRN: [id number],    Date: 08/17/2021  Time Spent: 12:00pm - 12:45pm    45 minutes   Treatment Type: Individual Therapy  Reported Symptoms: sadness, loneliness  Mental Status Exam: Appearance:  Casual     Behavior: Appropriate  Motor: Normal  Speech/Language:  Normal Rate  Affect: Appropriate  Mood: normal  Thought process: normal  Thought content:   WNL  Sensory/Perceptual disturbances:   WNL  Orientation: oriented to person, place, time/date, and situation  Attention: Good  Concentration: Good  Memory: WNL  Fund of knowledge:  Good  Insight:   Good  Judgment:  Good  Impulse Control: Good   Risk Assessment: Danger to Self:  No Self-injurious Behavior: No Danger to Others: No Duty to Warn:no Physical Aggression / Violence:No  Access to Firearms a concern: No  Gang Involvement:No   Subjective: Pt present for individual therapy via  phone.  Pt consents to telehealth session due to [place] 19 pandemic. Location of pt: home Location of therapist: home office.   Pt talked about her health.   She is trying to get use to her dentures.   She has been able to eat a little more food but it has to be a soft diet.  Addressed pt's health concerns.   Pt has been trying to get out more.  She went to the park and went to some stores.   Pt talked about a tough day she had when she felt like she was "thinking about everything" and felt very down.  Helped pt process her thoughts and feelings.  Pt feels badly that her family does not contact her very much.  She has to be the one to initiate contact.   Worked on additional coping and self care strategies.   Provided supportive therapy.    Interventions: Cognitive Behavioral Therapy and Insight-Oriented  Diagnosis:  F33.1  Plan of Care: Recommend ongoing therapy.   Pt participated in setting therapy goals.  Pt wants to have someone to talk to and  improve coping skills.   Plan to continue to meet every two weeks.   Pt is progressing toward treatment goals.   Treatment Plan (Treatment Plan Target Date: 06/09/2022) Client Abilities/Strengths  Pt is bright, engaging, and motivated for therapy.   Client Treatment Preferences  Individual therapy.  Client Statement of Needs  Improve coping skills.  Symptoms  Depressed or irritable mood. Lack of energy. Feelings of hopelessness, worthlessness, or inappropriate guilt. Low self-esteem. Unresolved grief issues.   Problems Addressed  Unipolar Depression Goals 1. Alleviate depressive symptoms and return to previous level of effective functioning. 2. Appropriately grieve the loss in order to normalize mood and to return to previously adaptive level of functioning. Objective Learn and implement behavioral strategies to overcome depression. Target Date: 2022-06-09 Frequency: Biweekly  Progress: 40 Modality: individual  Related Interventions Engage the client in "behavioral activation," increasing his/her activity level and contact with sources of reward, while identifying processes that inhibit activation.  Use behavioral techniques such as instruction, rehearsal, role-playing, role reversal, as needed, to facilitate activity in the client's daily life; reinforce success. Assist the client in developing skills that increase the likelihood of deriving pleasure from behavioral activation (e.g., assertiveness skills, developing an exercise plan, less internal/more external focus, increased social involvement); reinforce success. Objective Identify important people in life, past and present, and describe the quality, good and poor, of those relationships. Target Date: 2022-06-09 Frequency: Biweekly  Progress: 40 Modality: individual  Related Interventions Conduct Interpersonal Therapy beginning with the assessment of the client's "interpersonal inventory" of important past and present  relationships; develop a case formulation linking depression to grief, interpersonal role disputes, role transitions, and/or interpersonal deficits). Objective Learn and implement problem-solving and decision-making skills. Target Date: 2022-06-09 Frequency: Biweekly  Progress: 40 Modality: individual  Related Interventions Conduct Problem-Solving Therapy using techniques such as psychoeducation, modeling, and role-playing to teach client problem-solving skills (i.e., defining a problem specifically, generating possible solutions, evaluating the pros and cons of each solution, selecting and implementing a plan of action, evaluating the efficacy of the plan, accepting or revising the plan); role-play application of the problem-solving skill to a real life issue. Encourage in the client the development of a positive problem orientation in which problems and solving them are viewed as a natural part of life and not something to be feared, despaired, or avoided. 3. Develop healthy interpersonal relationships that lead to the alleviation and help prevent the relapse of depression. 4. Develop healthy thinking patterns and beliefs about self, others, and the world that lead to the alleviation and help prevent the relapse of depression. 5. Recognize, accept, and cope with feelings of depression. Diagnosis F33.1  Conditions For Discharge Achievement of treatment goals and objectives   Clint Bolder, LCSW

## 2021-08-25 ENCOUNTER — Other Ambulatory Visit: Payer: Self-pay | Admitting: Internal Medicine

## 2021-08-31 ENCOUNTER — Ambulatory Visit (INDEPENDENT_AMBULATORY_CARE_PROVIDER_SITE_OTHER): Payer: Medicare Other | Admitting: Psychology

## 2021-08-31 DIAGNOSIS — F331 Major depressive disorder, recurrent, moderate: Secondary | ICD-10-CM | POA: Diagnosis not present

## 2021-08-31 NOTE — Progress Notes (Signed)
[facility] Counselor/Therapist Progress Note  Patient ID: SHANTEL HELWIG, MRN: [id number],    Date: 08/31/2021  Time Spent: 12:00pm - 12:45pm    45 minutes   Treatment Type: Individual Therapy  Reported Symptoms: sadness, loneliness  Mental Status Exam: Appearance:  Casual     Behavior: Appropriate  Motor: Normal  Speech/Language:  Normal Rate  Affect: Appropriate  Mood: normal  Thought process: normal  Thought content:   WNL  Sensory/Perceptual disturbances:   WNL  Orientation: oriented to person, place, time/date, and situation  Attention: Good  Concentration: Good  Memory: WNL  Fund of knowledge:  Good  Insight:   Good  Judgment:  Good  Impulse Control: Good   Risk Assessment: Danger to Self:  No Self-injurious Behavior: No Danger to Others: No Duty to Warn:no Physical Aggression / Violence:No  Access to Firearms a concern: No  Gang Involvement:No   Subjective: Pt present for individual therapy via  phone.  Pt consents to telehealth session due to [place] 19 pandemic. Location of pt: home Location of therapist: home office.  Pt talked about being productive.   She has straightened up her living room and cleaned up her kitchen.  Pt has gone out every day to be around people.    Pt talked about her health.  Pt continues to struggle with her oral health issues.   Addressed pt's health concerns.   Pt talked about having some days that are harder than others.    Some days she feels like she was "thinking about everything" and felt very down.  Pt found out about an old friend passing away.  Helped pt process her thoughts and feelings.   Worked on additional coping and self care strategies.   Provided supportive therapy.    Interventions: Cognitive Behavioral Therapy and Insight-Oriented  Diagnosis:  F33.1  Plan of Care: Recommend ongoing therapy.   Pt participated in setting therapy goals.  Pt wants to have someone to talk to and improve coping  skills.   Plan to continue to meet every two weeks.   Pt is progressing toward treatment goals.   Treatment Plan (Treatment Plan Target Date: 06/09/2022) Client Abilities/Strengths  Pt is bright, engaging, and motivated for therapy.   Client Treatment Preferences  Individual therapy.  Client Statement of Needs  Improve coping skills.  Symptoms  Depressed or irritable mood. Lack of energy. Feelings of hopelessness, worthlessness, or inappropriate guilt. Low self-esteem. Unresolved grief issues.   Problems Addressed  Unipolar Depression Goals 1. Alleviate depressive symptoms and return to previous level of effective functioning. 2. Appropriately grieve the loss in order to normalize mood and to return to previously adaptive level of functioning. Objective Learn and implement behavioral strategies to overcome depression. Target Date: 2022-06-09 Frequency: Biweekly  Progress: 40 Modality: individual  Related Interventions Engage the client in "behavioral activation," increasing his/her activity level and contact with sources of reward, while identifying processes that inhibit activation.  Use behavioral techniques such as instruction, rehearsal, role-playing, role reversal, as needed, to facilitate activity in the client's daily life; reinforce success. Assist the client in developing skills that increase the likelihood of deriving pleasure from behavioral activation (e.g., assertiveness skills, developing an exercise plan, less internal/more external focus, increased social involvement); reinforce success. Objective Identify important people in life, past and present, and describe the quality, good and poor, of those relationships. Target Date: 2022-06-09 Frequency: Biweekly  Progress: 40 Modality: individual  Related Interventions Conduct Interpersonal Therapy beginning with the assessment of  the client's "interpersonal inventory" of important past and present relationships; develop a  case formulation linking depression to grief, interpersonal role disputes, role transitions, and/or interpersonal deficits). Objective Learn and implement problem-solving and decision-making skills. Target Date: 2022-06-09 Frequency: Biweekly  Progress: 40 Modality: individual  Related Interventions Conduct Problem-Solving Therapy using techniques such as psychoeducation, modeling, and role-playing to teach client problem-solving skills (i.e., defining a problem specifically, generating possible solutions, evaluating the pros and cons of each solution, selecting and implementing a plan of action, evaluating the efficacy of the plan, accepting or revising the plan); role-play application of the problem-solving skill to a real life issue. Encourage in the client the development of a positive problem orientation in which problems and solving them are viewed as a natural part of life and not something to be feared, despaired, or avoided. 3. Develop healthy interpersonal relationships that lead to the alleviation and help prevent the relapse of depression. 4. Develop healthy thinking patterns and beliefs about self, others, and the world that lead to the alleviation and help prevent the relapse of depression. 5. Recognize, accept, and cope with feelings of depression. Diagnosis F33.1  Conditions For Discharge Achievement of treatment goals and objectives   Clint Bolder, LCSW

## 2021-09-02 ENCOUNTER — Ambulatory Visit: Payer: Medicare Other | Admitting: Pulmonary Disease

## 2021-09-04 ENCOUNTER — Ambulatory Visit (INDEPENDENT_AMBULATORY_CARE_PROVIDER_SITE_OTHER): Payer: Medicare Other | Admitting: Primary Care

## 2021-09-04 ENCOUNTER — Encounter: Payer: Self-pay | Admitting: Primary Care

## 2021-09-04 DIAGNOSIS — J479 Bronchiectasis, uncomplicated: Secondary | ICD-10-CM

## 2021-09-04 MED ORDER — ALBUTEROL SULFATE (2.5 MG/3ML) 0.083% IN NEBU
INHALATION_SOLUTION | RESPIRATORY_TRACT | 3 refills | Status: DC
Start: 2021-09-04 — End: 2022-09-09

## 2021-09-04 MED ORDER — LEVOFLOXACIN 500 MG PO TABS: 500.0000 mg | ORAL_TABLET | Freq: Every day | ORAL | 0 refills | Status: DC

## 2021-09-04 MED ORDER — BUDESONIDE 0.5 MG/2ML IN SUSP
0.5000 mg | Freq: Two times a day (BID) | RESPIRATORY_TRACT | 11 refills | Status: DC
Start: 2021-09-04 — End: 2022-09-09

## 2021-09-04 MED ORDER — FORMOTEROL FUMARATE 20 MCG/2ML IN NEBU: 20.0000 ug | INHALATION_SOLUTION | Freq: Two times a day (BID) | RESPIRATORY_TRACT | 11 refills | Status: DC

## 2021-09-08 ENCOUNTER — Encounter: Payer: Self-pay | Admitting: Internal Medicine

## 2021-09-08 ENCOUNTER — Ambulatory Visit (INDEPENDENT_AMBULATORY_CARE_PROVIDER_SITE_OTHER): Payer: Medicare Other | Admitting: Internal Medicine

## 2021-09-08 VITALS — BP 140/68 | HR 78 | Temp 98.4°F | Ht 60.0 in | Wt 145.0 lb

## 2021-09-08 DIAGNOSIS — J449 Chronic obstructive pulmonary disease, unspecified: Secondary | ICD-10-CM

## 2021-09-08 DIAGNOSIS — E538 Deficiency of other specified B group vitamins: Secondary | ICD-10-CM

## 2021-09-08 DIAGNOSIS — R053 Chronic cough: Secondary | ICD-10-CM | POA: Diagnosis not present

## 2021-09-08 DIAGNOSIS — J479 Bronchiectasis, uncomplicated: Secondary | ICD-10-CM

## 2021-09-08 DIAGNOSIS — M810 Age-related osteoporosis without current pathological fracture: Secondary | ICD-10-CM

## 2021-09-08 DIAGNOSIS — J42 Unspecified chronic bronchitis: Secondary | ICD-10-CM

## 2021-09-08 MED ORDER — ALBUTEROL SULFATE HFA 108 (90 BASE) MCG/ACT IN AERS: 2.0000 | INHALATION_SPRAY | RESPIRATORY_TRACT | 11 refills | Status: DC | PRN

## 2021-09-08 MED ORDER — CLONAZEPAM 1 MG PO TABS: ORAL_TABLET | ORAL | 1 refills | Status: DC

## 2021-09-08 MED ORDER — METHYLPREDNISOLONE ACETATE 80 MG/ML IJ SUSP
80.0000 mg | Freq: Once | INTRAMUSCULAR | Status: AC
  Administered 2021-09-08: 80 mg via INTRAMUSCULAR

## 2021-09-08 NOTE — Assessment & Plan Note (Signed)
Worsened COPD, bronchiectasis - just finished Levaquin. Chest CT is pending.

## 2021-09-10 ENCOUNTER — Telehealth: Payer: Self-pay | Admitting: Primary Care

## 2021-09-10 DIAGNOSIS — J479 Bronchiectasis, uncomplicated: Secondary | ICD-10-CM

## 2021-09-10 NOTE — Telephone Encounter (Signed)
Veronica Vanschaick came into office this morning stating that she has not gotten a call regarding her CT. She is requesting a call from the [facility]. Pt was informed that she will get an call when the time come closer to her CT schedule date. Checked active request and no order in for it. Triage can someone put in order for it, and [facility] give Veronica [name] a call. Routing to triage , and [facility] pool

## 2021-09-10 NOTE — Telephone Encounter (Signed)
Patient was seen 09/04/21 by Eustaquio Maize there was no HRCT order placed for the patient but Beth's notes stated ordered HRCT

## 2021-09-10 NOTE — Telephone Encounter (Signed)
I pulled the order and will get it scheduled today. Nothing further needed at this time

## 2021-09-10 NOTE — Telephone Encounter (Signed)
Orders: - HRCT re: bronchiectasis     Order placed for the HRCT. Routing back to PCCs.

## 2021-09-14 ENCOUNTER — Ambulatory Visit (HOSPITAL_COMMUNITY)
Admission: RE | Admit: 2021-09-14 | Discharge: 2021-09-14 | Disposition: A | Discharge status: Home / Self Care | Payer: Medicare Other | Source: Ambulatory Visit | Attending: Primary Care | Admitting: Primary Care

## 2021-09-14 ENCOUNTER — Ambulatory Visit (INDEPENDENT_AMBULATORY_CARE_PROVIDER_SITE_OTHER): Payer: Medicare Other | Admitting: Psychology

## 2021-09-14 DIAGNOSIS — J479 Bronchiectasis, uncomplicated: Secondary | ICD-10-CM

## 2021-09-14 DIAGNOSIS — F331 Major depressive disorder, recurrent, moderate: Secondary | ICD-10-CM

## 2021-09-14 NOTE — Progress Notes (Signed)
[facility] Counselor/Therapist Progress Note  Patient ID: Veronica [name], MRN: [id number],    Date: 09/14/2021  Time Spent: 12:00pm - 12:45pm    45 minutes   Treatment Type: Individual Therapy  Reported Symptoms: sadness, loneliness  Mental Status Exam: Appearance:  Casual     Behavior: Appropriate  Motor: Normal  Speech/Language:  Normal Rate  Affect: Appropriate  Mood: normal  Thought process: normal  Thought content:   WNL  Sensory/Perceptual disturbances:   WNL  Orientation: oriented to person, place, time/date, and situation  Attention: Good  Concentration: Good  Memory: WNL  Fund of knowledge:  Good  Insight:   Good  Judgment:  Good  Impulse Control: Good   Risk Assessment: Danger to Self:  No Self-injurious Behavior: No Danger to Others: No Duty to Warn:no Physical Aggression / Violence:No  Access to Firearms a concern: No  Gang Involvement:No   Subjective: Pt present for individual therapy via  phone.  Pt consents to telehealth session due to [place] 19 pandemic. Location of pt: home Location of therapist: home office.   Pt talked about her health.  Pt continues to struggle with her oral health issues.   Pt went to the doctor last week and was told she had congestion in her lungs.   Pt was put on antibiotics and steroids with no improvement.   She has a CAT scan today to further assess.  Addressed pt's health concerns.   Pt talked about having some days that are harder than others.    Pt feels sad at times that she has not seen her brother since December even though they talk on the phone frequently.   Helped pt process her thoughts and feelings.   Worked on additional coping and self care strategies.   Provided supportive therapy.    Interventions: Cognitive Behavioral Therapy and Insight-Oriented  Diagnosis:  F33.1  Plan of Care: Recommend ongoing therapy.   Pt participated in setting therapy goals.  Pt wants to have someone to talk to and  improve coping skills.   Plan to continue to meet every two weeks.   Pt is progressing toward treatment goals.   Treatment Plan (Treatment Plan Target Date: 06/09/2022) Client Abilities/Strengths  Pt is bright, engaging, and motivated for therapy.   Client Treatment Preferences  Individual therapy.  Client Statement of Needs  Improve coping skills.  Symptoms  Depressed or irritable mood. Lack of energy. Feelings of hopelessness, worthlessness, or inappropriate guilt. Low self-esteem. Unresolved grief issues.   Problems Addressed  Unipolar Depression Goals 1. Alleviate depressive symptoms and return to previous level of effective functioning. 2. Appropriately grieve the loss in order to normalize mood and to return to previously adaptive level of functioning. Objective Learn and implement behavioral strategies to overcome depression. Target Date: 2022-06-09 Frequency: Biweekly  Progress: 40 Modality: individual  Related Interventions Engage the client in "behavioral activation," increasing his/her activity level and contact with sources of reward, while identifying processes that inhibit activation.  Use behavioral techniques such as instruction, rehearsal, role-playing, role reversal, as needed, to facilitate activity in the client's daily life; reinforce success. Assist the client in developing skills that increase the likelihood of deriving pleasure from behavioral activation (e.g., assertiveness skills, developing an exercise plan, less internal/more external focus, increased social involvement); reinforce success. Objective Identify important people in life, past and present, and describe the quality, good and poor, of those relationships. Target Date: 2022-06-09 Frequency: Biweekly  Progress: 40 Modality: individual  Related Interventions Conduct  Interpersonal Therapy beginning with the assessment of the client's "interpersonal inventory" of important past and present  relationships; develop a case formulation linking depression to grief, interpersonal role disputes, role transitions, and/or interpersonal deficits). Objective Learn and implement problem-solving and decision-making skills. Target Date: 2022-06-09 Frequency: Biweekly  Progress: 40 Modality: individual  Related Interventions Conduct Problem-Solving Therapy using techniques such as psychoeducation, modeling, and role-playing to teach client problem-solving skills (i.e., defining a problem specifically, generating possible solutions, evaluating the pros and cons of each solution, selecting and implementing a plan of action, evaluating the efficacy of the plan, accepting or revising the plan); role-play application of the problem-solving skill to a real life issue. Encourage in the client the development of a positive problem orientation in which problems and solving them are viewed as a natural part of life and not something to be feared, despaired, or avoided. 3. Develop healthy interpersonal relationships that lead to the alleviation and help prevent the relapse of depression. 4. Develop healthy thinking patterns and beliefs about self, others, and the world that lead to the alleviation and help prevent the relapse of depression. 5. Recognize, accept, and cope with feelings of depression. Diagnosis F33.1  Conditions For Discharge Achievement of treatment goals and objectives   Clint Bolder, LCSW

## 2021-09-18 ENCOUNTER — Telehealth: Payer: Self-pay | Admitting: Pulmonary Disease

## 2021-09-18 NOTE — Telephone Encounter (Signed)
Patient calling regarding CT scan results. Please call back at [phone]

## 2021-09-18 NOTE — Telephone Encounter (Signed)
ATC LVTCB on Monday

## 2021-09-21 NOTE — Telephone Encounter (Signed)
Sent results note to you

## 2021-09-21 NOTE — Telephone Encounter (Signed)
Patient called and came into the office this morning about there CT results.  Beth please advise on results

## 2021-09-21 NOTE — Progress Notes (Signed)
No interstitial lung disease. Focal area of mucus impaction and two very small unchanged left sided pulmonary nodules which are benign. Lungs looked ok. Atherosclerotic calcification of the aorta and coronary arteries. Liver was irregular, needs to follow up with PCP regarding these findings

## 2021-09-21 NOTE — Telephone Encounter (Signed)
Spoke with pt and reviewed CT results as dictated by Geraldo Pitter. Pt stated understanding. Nothing further needed at this time.

## 2021-09-22 ENCOUNTER — Other Ambulatory Visit: Payer: Self-pay | Admitting: *Deleted

## 2021-09-22 NOTE — Patient Outreach (Signed)
  Care Coordination   Follow Up Visit Note   09/22/2021 Name: Veronica [name] MRN: [id number] DOB: [birth year]  Veronica [name] is a 71 y.o. year old female who sees Plotnikov, Evie Lacks, MD for primary care. I spoke with  Veronica [name] by phone today  What matters to the patients health and wellness today?  Patient shared her concerned about her CT Chest High Resolution Scan results. Nurse discussed with patient to write all of her questions regarding her scan on paper and bring the questions to her upcoming appointment with PCP on 09/29/21 to be addressed. Patient verbalized understanding. Patient did not have any further questions or concerns today and did confirm that she has this nurse's contact number to call her if needed.    Goals Addressed               This Visit's Progress     Patient Stated     Patient Stated concern regarding CT Chest High Resolution Scan (pt-stated)         Care Coordination Interventions: Evaluation of current treatment plan related to COPD and patient's adherence to plan as established by provider Advised patient to limit time spent outside during hot and poor air quality days Provided education to patient re: maintaining weight due to ongoing healing from long-term oral surgies Advised patient to discuss results of CT Chest high resolution scan during 09/29/21 appointment with provider Assessed social determinant of health barriers         SDOH assessments and interventions completed:   Yes   Care Coordination Interventions Activated:  No Care Coordination Interventions:   No, not indicated  Follow up plan: Follow up call scheduled for 10/02/21  Encounter Outcome:  Pt. Visit Completed  Emelia Loron RN, BSN [place] [phone] [email or web address]

## 2021-09-23 ENCOUNTER — Telehealth: Payer: Self-pay | Admitting: *Deleted

## 2021-09-23 NOTE — Telephone Encounter (Signed)
Called [facility] assist spoke w/ Ivy Lynn. Place order for pt Vrayler. She states she will received a 3 month supply. Order # [id number].Marland KitchenJohny Chess

## 2021-09-24 ENCOUNTER — Ambulatory Visit (INDEPENDENT_AMBULATORY_CARE_PROVIDER_SITE_OTHER): Payer: Medicare Other | Admitting: Psychology

## 2021-09-24 DIAGNOSIS — F331 Major depressive disorder, recurrent, moderate: Secondary | ICD-10-CM | POA: Diagnosis not present

## 2021-09-24 NOTE — Progress Notes (Signed)
[facility] Counselor/Therapist Progress Note  Patient ID: Veronica [name], MRN: [id number],    Date: 09/24/2021  Time Spent: 6:00pm - 6:45pm    45 minutes   Treatment Type: Individual Therapy  Reported Symptoms: worrying  Mental Status Exam: Appearance:  Casual     Behavior: Appropriate  Motor: Normal  Speech/Language:  Normal Rate  Affect: Appropriate  Mood: normal  Thought process: normal  Thought content:   WNL  Sensory/Perceptual disturbances:   WNL  Orientation: oriented to person, place, time/date, and situation  Attention: Good  Concentration: Good  Memory: WNL  Fund of knowledge:  Good  Insight:   Good  Judgment:  Good  Impulse Control: Good   Risk Assessment: Danger to Self:  No Self-injurious Behavior: No Danger to Others: No Duty to Warn:no Physical Aggression / Violence:No  Access to Firearms a concern: No  Gang Involvement:No   Subjective: Pt present for individual therapy via  phone.  Pt consents to telehealth session due to [place] 19 pandemic. Location of pt: home Location of therapist: home office.   Pt talked about her health.  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   Pt will meet with her doctor next week to go over treatment options.   Addressed pt's health concerns.  Worked on worry management. Pt talked about having some days that are harder than others.    Helped pt process her thoughts and feelings.   Worked on additional coping and self care strategies.   Provided supportive therapy.    Interventions: Cognitive Behavioral Therapy and Insight-Oriented  Diagnosis:  F33.1  Plan of Care: Recommend ongoing therapy.   Pt participated in setting therapy goals.  Pt wants to have someone to talk to and improve coping skills.   Plan to continue to meet every two weeks.   Pt is progressing toward treatment goals.   Treatment Plan (Treatment Plan  Target Date: 06/09/2022) Client Abilities/Strengths  Pt is bright, engaging, and motivated for therapy.   Client Treatment Preferences  Individual therapy.  Client Statement of Needs  Improve coping skills.  Symptoms  Depressed or irritable mood. Lack of energy. Feelings of hopelessness, worthlessness, or inappropriate guilt. Low self-esteem. Unresolved grief issues.   Problems Addressed  Unipolar Depression Goals 1. Alleviate depressive symptoms and return to previous level of effective functioning. 2. Appropriately grieve the loss in order to normalize mood and to return to previously adaptive level of functioning. Objective Learn and implement behavioral strategies to overcome depression. Target Date: 2022-06-09 Frequency: Biweekly  Progress: 40 Modality: individual  Related Interventions Engage the client in "behavioral activation," increasing his/her activity level and contact with sources of reward, while identifying processes that inhibit activation.  Use behavioral techniques such as instruction, rehearsal, role-playing, role reversal, as needed, to facilitate activity in the client's daily life; reinforce success. Assist the client in developing skills that increase the likelihood of deriving pleasure from behavioral activation (e.g., assertiveness skills, developing an exercise plan, less internal/more external focus, increased social involvement); reinforce success. Objective Identify important people in life, past and present, and describe the quality, good and poor, of those relationships. Target Date: 2022-06-09 Frequency: Biweekly  Progress: 40 Modality: individual  Related Interventions Conduct Interpersonal Therapy beginning with the assessment of the client's "interpersonal inventory" of important past and present relationships; develop a case formulation linking depression to grief, interpersonal role disputes, role transitions, and/or interpersonal  deficits). Objective Learn and  implement problem-solving and decision-making skills. Target Date: 2022-06-09 Frequency: Biweekly  Progress: 40 Modality: individual  Related Interventions Conduct Problem-Solving Therapy using techniques such as psychoeducation, modeling, and role-playing to teach client problem-solving skills (i.e., defining a problem specifically, generating possible solutions, evaluating the pros and cons of each solution, selecting and implementing a plan of action, evaluating the efficacy of the plan, accepting or revising the plan); role-play application of the problem-solving skill to a real life issue. Encourage in the client the development of a positive problem orientation in which problems and solving them are viewed as a natural part of life and not something to be feared, despaired, or avoided. 3. Develop healthy interpersonal relationships that lead to the alleviation and help prevent the relapse of depression. 4. Develop healthy thinking patterns and beliefs about self, others, and the world that lead to the alleviation and help prevent the relapse of depression. 5. Recognize, accept, and cope with feelings of depression. Diagnosis F33.1  Conditions For Discharge Achievement of treatment goals and objectives   Clint Bolder, LCSW

## 2021-09-28 ENCOUNTER — Ambulatory Visit: Payer: Medicare Other | Admitting: Psychology

## 2021-09-29 ENCOUNTER — Encounter: Payer: Self-pay | Admitting: Internal Medicine

## 2021-09-29 ENCOUNTER — Ambulatory Visit (INDEPENDENT_AMBULATORY_CARE_PROVIDER_SITE_OTHER): Payer: Medicare Other | Admitting: Internal Medicine

## 2021-09-29 DIAGNOSIS — I25111 Atherosclerotic heart disease of native coronary artery with angina pectoris with documented spasm: Secondary | ICD-10-CM

## 2021-09-29 DIAGNOSIS — R932 Abnormal findings on diagnostic imaging of liver and biliary tract: Secondary | ICD-10-CM

## 2021-09-29 DIAGNOSIS — J479 Bronchiectasis, uncomplicated: Secondary | ICD-10-CM | POA: Diagnosis not present

## 2021-09-29 NOTE — Progress Notes (Signed)
Subjective:  Patient ID: DELORES EDELSTEIN, female    DOB: 1951/01/24  Age: 71 y.o. MRN: [id number]  CC: Follow-up (Want to discuss about her ct scan, the abnormality of it)   HPI ADDALYNN KUMARI presents for to discuss abn recent chest CT.   Outpatient Medications Prior to Visit  Medication Sig Dispense Refill   acetaminophen-codeine (TYLENOL #3) 300-30 MG tablet Take 1 tablet by mouth every 6 (six) hours as needed. 60 tablet 2   albuterol (PROVENTIL) (2.5 MG/3ML) 0.083% nebulizer solution USE 1 VIAL IN NEBULIZER 2 TIMES DAILY. Generic: VENTOLIN 180 mL 3   albuterol (VENTOLIN HFA) 108 (90 Base) MCG/ACT inhaler Inhale 2 puffs into the lungs every 4 (four) hours as needed for wheezing or shortness of breath. 18 g 11   aspirin 81 MG chewable tablet Chew by mouth daily.     atorvastatin (LIPITOR) 40 MG tablet TAKE 1 TABLET(40 MG) BY MOUTH DAILY 90 tablet 3   B-D INSULIN SYRINGE 1CC/25GX1" 25G X 1" 1 ML MISC USE AS DIRECTED EVERY 2 WEEKS 50 each 5   budesonide (PULMICORT) 0.5 MG/2ML nebulizer solution Take 2 mLs (0.5 mg total) by nebulization in the morning and at bedtime. 120 mL 11   calcium carbonate (OSCAL) 1500 (600 Ca) MG TABS tablet Take 600 mg of elemental calcium by mouth 2 (two) times daily with a meal.      cariprazine (VRAYLAR) 1.5 MG capsule Take 1 capsule (1.5 mg total) by mouth daily. NDC#- 02637-858-85 Exp 10/2023 Lot# [id number] 7 capsule 4   chlorhexidine (PERIDEX) 0.12 % solution Use to wash mouth twice a day     Cholecalciferol (VITAMIN D3) 50 MCG (2000 UT) capsule Take 1 capsule (2,000 Units total) by mouth daily. 100 capsule 3   clonazePAM (KLONOPIN) 1 MG tablet TAKE 2 TABLETS BY MOUTH EVERY NIGHT AT BEDTIME AND 1/2 TABLET EXTRA IN DAYTIME AS NEEDED 225 tablet 1   cyanocobalamin (,VITAMIN B-12,) 1000 MCG/ML injection INJECT 1ML IN THE MUSCLE EVERY 14 DAYS. 10 mL 5   diphenoxylate-atropine (LOMOTIL) 2.5-0.025 MG tablet Take 1-2 tablets by mouth 4 (four) times daily as needed for  diarrhea or loose stools. 60 tablet 1   Docusate Calcium (STOOL SOFTENER PO) Take 100 mg by mouth as needed (for constipation).      escitalopram (LEXAPRO) 20 MG tablet Take 1 tablet (20 mg total) by mouth daily. 90 tablet 3   formoterol (PERFOROMIST) 20 MCG/2ML nebulizer solution Take 2 mLs (20 mcg total) by nebulization 2 (two) times daily. 120 mL 11   furosemide (LASIX) 20 MG tablet TAKE 1 TABLET(20 MG) BY MOUTH DAILY AS NEEDED 90 tablet 1   levofloxacin (LEVAQUIN) 500 MG tablet Take 1 tablet (500 mg total) by mouth daily. 7 tablet 0   levothyroxine (SYNTHROID) 88 MCG tablet TAKE 1 TABLET(88 MCG) BY MOUTH DAILY 90 tablet 3   nitroGLYCERIN (NITROSTAT) 0.4 MG SL tablet DISSOLVE 1 TABLET UNDER THE TONGUE AS DIRECTED 25 tablet 2   Omega-3 Fatty Acids (FISH OIL) 1000 MG CAPS Take 1 capsule by mouth daily.     phenytoin (DILANTIN) 100 MG ER capsule TAKE 2 CAPSULES BY MOUTH EVERY MORNING AND 1 CAPSULE EVERY EVENING 270 capsule 3   promethazine (PHENERGAN) 12.5 MG tablet Take by mouth.     Respiratory Therapy Supplies (FLUTTER) DEVI Use after nebulization treatments 1 each 0   sodium chloride (MURO 128) 5 % ophthalmic solution Place 1 drop into both eyes as needed for irritation.  SYRINGE-NEEDLE, DISP, 3 ML (BD ECLIPSE SYRINGE) 25G X 1" 3 ML MISC Use sq q 2 wks 50 each 3   traZODone (DESYREL) 50 MG tablet TAKE 4 TABLETS(200 MG) BY MOUTH AT BEDTIME 360 tablet 3   verapamil (CALAN-SR) 240 MG CR tablet TAKE 1 TABLET(240 MG) BY MOUTH AT BEDTIME 90 tablet 2   No facility-administered medications prior to visit.    ROS: Review of Systems  Constitutional:  Positive for fatigue. Negative for activity change, appetite change, chills and unexpected weight change.  HENT:  Negative for congestion, mouth sores and sinus pressure.   Eyes:  Negative for visual disturbance.  Respiratory:  Positive for cough. Negative for chest tightness.   Cardiovascular:  Negative for chest pain and palpitations.   Gastrointestinal:  Negative for abdominal pain and nausea.  Genitourinary:  Negative for difficulty urinating, frequency and vaginal pain.  Musculoskeletal:  Positive for back pain and gait problem.  Skin:  Negative for pallor and rash.  Neurological:  Negative for dizziness, tremors, weakness, numbness and headaches.  Psychiatric/Behavioral:  Negative for confusion, sleep disturbance and suicidal ideas. The patient is not nervous/anxious.     Objective:  BP (!) 142/74 (BP Location: Left Arm, Patient Position: Sitting, Cuff Size: Large)   Pulse 79   Temp 98.7 F (37.1 C) (Oral)   Ht 5' (1.524 m)   Wt 146 lb 6 oz (66.4 kg)   SpO2 95%   BMI 28.59 kg/m   BP Readings from Last 3 Encounters:  09/29/21 (!) 142/74  09/08/21 140/68  09/04/21 118/70    Wt Readings from Last 3 Encounters:  09/29/21 146 lb 6 oz (66.4 kg)  09/08/21 145 lb (65.8 kg)  09/04/21 147 lb 9.6 oz (67 kg)    Physical Exam Constitutional:      General: She is not in acute distress.    Appearance: Normal appearance. She is well-developed.  HENT:     Head: Normocephalic.     Right Ear: External ear normal.     Left Ear: External ear normal.     Nose: Nose normal.  Eyes:     General:        Right eye: No discharge.        Left eye: No discharge.     Conjunctiva/sclera: Conjunctivae normal.     Pupils: Pupils are equal, round, and reactive to light.  Neck:     Thyroid: No thyromegaly.     Vascular: No JVD.     Trachea: No tracheal deviation.  Cardiovascular:     Rate and Rhythm: Normal rate and regular rhythm.     Heart sounds: Normal heart sounds.  Pulmonary:     Effort: No respiratory distress.     Breath sounds: No stridor. No wheezing.  Abdominal:     General: Bowel sounds are normal. There is no distension.     Palpations: Abdomen is soft. There is no mass.     Tenderness: There is no abdominal tenderness. There is no guarding or rebound.  Musculoskeletal:        General: Tenderness present.      Cervical back: Normal range of motion and neck supple. No rigidity.  Lymphadenopathy:     Cervical: No cervical adenopathy.  Skin:    Findings: No erythema or rash.  Neurological:     Mental Status: She is oriented to person, place, and time.     Cranial Nerves: No cranial nerve deficit.     Motor: Weakness present. No  abnormal muscle tone.     Coordination: Coordination abnormal.     Deep Tendon Reflexes: Reflexes normal.  Psychiatric:        Behavior: Behavior normal.        Thought Content: Thought content normal.        Judgment: Judgment normal.   No HSM  Lab Results  Component Value Date   WBC 7.7 06/10/2021   HGB 13.2 06/10/2021   HCT 39.0 06/10/2021   PLT 284.0 06/10/2021   GLUCOSE 122 (H) 06/10/2021   CHOL 249 (H) 11/10/2018   TRIG 88.0 11/10/2018   HDL 57.70 11/10/2018   LDLDIRECT 165.0 09/26/2009   LDLCALC 174 (H) 11/10/2018   ALT 11 06/10/2021   AST 13 06/10/2021   NA 142 06/10/2021   K 3.6 06/10/2021   CL 102 06/10/2021   CREATININE 0.61 06/10/2021   BUN 7 06/10/2021   CO2 30 06/10/2021   TSH 2.75 06/10/2021   INR 1.0 ratio 11/07/2009   HGBA1C 5.7 06/10/2021    CT Chest High Resolution  Result Date: 09/16/2021 CLINICAL DATA:  Bronchiectasis. EXAM: CT CHEST WITHOUT CONTRAST TECHNIQUE: Multidetector CT imaging of the chest was performed following the standard protocol without intravenous contrast. High resolution imaging of the lungs, as well as inspiratory and expiratory imaging, was performed. RADIATION DOSE REDUCTION: This exam was performed according to the departmental dose-optimization program which includes automated exposure control, adjustment of the mA and/or kV according to patient size and/or use of iterative reconstruction technique. COMPARISON:  06/28/2016. FINDINGS: Cardiovascular: Atherosclerotic calcification of the aorta and coronary arteries. Heart size normal. No pericardial effusion. Mediastinum/Nodes: Thyroidectomy. No pathologically  enlarged mediastinal or axillary lymph nodes. Hilar regions are difficult to definitively evaluate without IV contrast. Esophagus is grossly unremarkable. Lungs/Pleura: Negative for subpleural reticulation, traction bronchiectasis/bronchiolectasis, ground-glass, architectural distortion or honeycombing. Focal mucoid impaction in the apical left upper lobe (10/23). 3 mm peripheral left upper lobe nodule (10/63) and peripheral left lower lobe nodule (10/103), unchanged and benign. 4 mm lingular ground-glass nodule (10/94). No specific follow-up recommended. No air trapping. No pleural fluid. Airway is unremarkable. Upper Abdomen: Liver margin is slightly irregular. Subcentimeter low-attenuation lesion in the dome of the right hepatic lobe is too small to characterize. Visualized portions of the liver, adrenal glands, kidneys, spleen pancreas are otherwise unremarkable. Postoperative changes of fundoplication. Cholecystectomy. Musculoskeletal: Degenerative changes in the spine. No worrisome lytic or sclerotic lesions. IMPRESSION: 1. No evidence of interstitial lung disease. 2. Cirrhosis. 3. Aortic atherosclerosis (ICD10-I70.0). Coronary artery calcification. Electronically Signed   By: Lorin Picket M.D.   On: 09/16/2021 09:36    Assessment & Plan:   Problem List Items Addressed This Visit     Abnormal CT of liver    Discussed: CT -- Liver margin is slightly irregular. Subcentimeter low-attenuation lesion in the dome of the right hepatic lobe is too small to characterize. Visualized portions of the liver, adrenal glands, kidneys, spleen pancreas are otherwise unremarkable.  The pt is worried. Doubt it is a problem now. Previous tests were ok. Will obtain GI consult w/Dr Pyrtle      Relevant Orders   Ambulatory referral to Gastroenterology   Bronchiectasis [facility])    Chest CT was reviewed w/pt      Coronary artery disease    No CP. On Atorvastatin Will get a cardiology consult - Dr C.       Relevant Orders   Ambulatory referral to Cardiology      No orders of the defined  types were placed in this encounter.     Follow-up: No follow-ups on file.  Walker Kehr, MD

## 2021-09-29 NOTE — Telephone Encounter (Signed)
Called pt inform Vrayler came in today. 3 bottles gave to asst since pt is coming in this afternoon.Marland KitchenJohny [name]

## 2021-09-29 NOTE — Assessment & Plan Note (Signed)
Chest CT was reviewed w/pt

## 2021-09-29 NOTE — Assessment & Plan Note (Addendum)
Discussed: CT -- Liver margin is slightly irregular. Subcentimeter low-attenuation lesion in the dome of the right hepatic lobe is too small to characterize. Visualized portions of the liver, adrenal glands, kidneys, spleen pancreas are otherwise unremarkable.  The pt is worried. Doubt it is a problem now. Previous tests were ok. Will obtain GI consult w/Dr Hilarie Fredrickson

## 2021-09-29 NOTE — Assessment & Plan Note (Signed)
No CP. On Atorvastatin Will get a cardiology consult - Dr C.

## 2021-10-02 ENCOUNTER — Ambulatory Visit: Payer: Self-pay | Admitting: *Deleted

## 2021-10-02 NOTE — Patient Outreach (Signed)
[facility]) Care Management  10/02/2021  Veronica [name] Jul 05, 1950 [id number]  Nurse spoke with patient to inform them that their PCP has a RN CM what will follow up with them in the future. Patient verbalized understanding.   Plan: RN Health Coach will close case.    Emelia Loron RN, BSN [place] [phone] [name]

## 2021-10-08 ENCOUNTER — Ambulatory Visit (INDEPENDENT_AMBULATORY_CARE_PROVIDER_SITE_OTHER): Payer: Medicare Other | Admitting: *Deleted

## 2021-10-08 DIAGNOSIS — M810 Age-related osteoporosis without current pathological fracture: Secondary | ICD-10-CM | POA: Diagnosis not present

## 2021-10-08 MED ORDER — DENOSUMAB 60 MG/ML ~~LOC~~ SOSY
60.0000 mg | PREFILLED_SYRINGE | Freq: Once | SUBCUTANEOUS | Status: AC
  Administered 2021-10-08: 60 mg via SUBCUTANEOUS

## 2021-10-08 NOTE — Progress Notes (Signed)
Patient is here for her Prolia injections. Given in right arm subq. Patient tolerated well  Please co sign

## 2021-10-12 ENCOUNTER — Ambulatory Visit (INDEPENDENT_AMBULATORY_CARE_PROVIDER_SITE_OTHER): Payer: Medicare Other | Admitting: Psychology

## 2021-10-12 DIAGNOSIS — F331 Major depressive disorder, recurrent, moderate: Secondary | ICD-10-CM | POA: Diagnosis not present

## 2021-10-12 NOTE — Progress Notes (Signed)
[facility] Counselor/Therapist Progress Note  Patient ID: Veronica [name], MRN: [id number],    Date: 10/12/2021  Time Spent: 12:00pm - 12:45pm    45 minutes   Treatment Type: Individual Therapy  Reported Symptoms: worrying  Mental Status Exam: Appearance:  Casual     Behavior: Appropriate  Motor: Normal  Speech/Language:  Normal Rate  Affect: Appropriate  Mood: normal  Thought process: normal  Thought content:   WNL  Sensory/Perceptual disturbances:   WNL  Orientation: oriented to person, place, time/date, and situation  Attention: Good  Concentration: Good  Memory: WNL  Fund of knowledge:  Good  Insight:   Good  Judgment:  Good  Impulse Control: Good   Risk Assessment: Danger to Self:  No Self-injurious Behavior: No Danger to Others: No Duty to Warn:no Physical Aggression / Violence:No  Access to Firearms a concern: No  Gang Involvement:No   Subjective: Pt present for individual therapy via  phone.  Pt consents to telehealth session due to [place] 19 pandemic. Location of pt: home Location of therapist: home office.   Pt talked about feeling overwhelmed with things.  She has been trying to organize things in her house.  She had to take a break since it was overwhelming her.   Pt talked about her health.  She has had some doctors' appointments.   Pt had an abnormal CT of the liver and has some issues with her heart.   Addressed pt's health concerns.   Helped pt process her thoughts and feelings.   Pt continues to have phone calls with her brother but still has not seen him in a while.   Addressed relationship dynamics.   Worked on additional coping and self care strategies.   Provided supportive therapy.    Interventions: Cognitive Behavioral Therapy and Insight-Oriented  Diagnosis:  F33.1  Plan of Care: Recommend ongoing therapy.   Pt participated in setting therapy goals.  Pt wants to have someone to talk to and improve coping skills.    Plan to continue to meet every two weeks.   Pt is progressing toward treatment goals.   Treatment Plan (Treatment Plan Target Date: 06/09/2022) Client Abilities/Strengths  Pt is bright, engaging, and motivated for therapy.   Client Treatment Preferences  Individual therapy.  Client Statement of Needs  Improve coping skills.  Symptoms  Depressed or irritable mood. Lack of energy. Feelings of hopelessness, worthlessness, or inappropriate guilt. Low self-esteem. Unresolved grief issues.   Problems Addressed  Unipolar Depression Goals 1. Alleviate depressive symptoms and return to previous level of effective functioning. 2. Appropriately grieve the loss in order to normalize mood and to return to previously adaptive level of functioning. Objective Learn and implement behavioral strategies to overcome depression. Target Date: 2022-06-09 Frequency: Biweekly  Progress: 40 Modality: individual  Related Interventions Engage the client in "behavioral activation," increasing his/her activity level and contact with sources of reward, while identifying processes that inhibit activation.  Use behavioral techniques such as instruction, rehearsal, role-playing, role reversal, as needed, to facilitate activity in the client's daily life; reinforce success. Assist the client in developing skills that increase the likelihood of deriving pleasure from behavioral activation (e.g., assertiveness skills, developing an exercise plan, less internal/more external focus, increased social involvement); reinforce success. Objective Identify important people in life, past and present, and describe the quality, good and poor, of those relationships. Target Date: 2022-06-09 Frequency: Biweekly  Progress: 40 Modality: individual  Related Interventions Conduct Interpersonal Therapy beginning  with the assessment of the client's "interpersonal inventory" of important past and present relationships; develop a case  formulation linking depression to grief, interpersonal role disputes, role transitions, and/or interpersonal deficits). Objective Learn and implement problem-solving and decision-making skills. Target Date: 2022-06-09 Frequency: Biweekly  Progress: 40 Modality: individual  Related Interventions Conduct Problem-Solving Therapy using techniques such as psychoeducation, modeling, and role-playing to teach client problem-solving skills (i.e., defining a problem specifically, generating possible solutions, evaluating the pros and cons of each solution, selecting and implementing a plan of action, evaluating the efficacy of the plan, accepting or revising the plan); role-play application of the problem-solving skill to a real life issue. Encourage in the client the development of a positive problem orientation in which problems and solving them are viewed as a natural part of life and not something to be feared, despaired, or avoided. 3. Develop healthy interpersonal relationships that lead to the alleviation and help prevent the relapse of depression. 4. Develop healthy thinking patterns and beliefs about self, others, and the world that lead to the alleviation and help prevent the relapse of depression. 5. Recognize, accept, and cope with feelings of depression. Diagnosis F33.1  Conditions For Discharge Achievement of treatment goals and objectives   Clint Bolder, LCSW

## 2021-10-20 ENCOUNTER — Telehealth: Payer: Self-pay | Admitting: Internal Medicine

## 2021-10-20 NOTE — Telephone Encounter (Signed)
Caller & Relationship to patient: Yessica Putnam  Call back number: [phone]  Date of last office visit: 09/29/21  Date of next office visit: 12/08/21  Medication(s) to be refilled: atorvastatin (LIPITOR) 40 MG tablet  Preferred Pharmacy:  [facility], [place] - [address] Phone:  [phone]  Fax:  [phone]

## 2021-10-21 MED ORDER — ATORVASTATIN CALCIUM 40 MG PO TABS: 40.0000 mg | ORAL_TABLET | Freq: Every day | ORAL | 0 refills | Status: DC

## 2021-10-21 NOTE — Telephone Encounter (Signed)
[facility] has been [facility] to [facility].Marland KitchenJohny [name]

## 2021-10-26 ENCOUNTER — Ambulatory Visit (INDEPENDENT_AMBULATORY_CARE_PROVIDER_SITE_OTHER): Payer: Medicare Other | Admitting: Psychology

## 2021-10-26 DIAGNOSIS — F331 Major depressive disorder, recurrent, moderate: Secondary | ICD-10-CM

## 2021-10-26 NOTE — Progress Notes (Signed)
[place] Counselor/Therapist Progress Note  Patient ID: Veronica [name], MRN: [id number],    Date: 10/26/2021  Time Spent: 12:00pm - 12:45pm    45 minutes   Treatment Type: Individual Therapy  Reported Symptoms: worrying  Mental Status Exam: Appearance:  Casual     Behavior: Appropriate  Motor: Normal  Speech/Language:  Normal Rate  Affect: Appropriate  Mood: normal  Thought process: normal  Thought content:   WNL  Sensory/Perceptual disturbances:   WNL  Orientation: oriented to person, place, time/date, and situation  Attention: Good  Concentration: Good  Memory: WNL  Fund of knowledge:  Good  Insight:   Good  Judgment:  Good  Impulse Control: Good   Risk Assessment: Danger to Self:  No Self-injurious Behavior: No Danger to Others: No Duty to Warn:no Physical Aggression / Violence:No  Access to Firearms a concern: No  Gang Involvement:No   Subjective: Pt present for individual therapy via  phone.  Pt consents to telehealth session due to [place] 19 pandemic. Location of pt: home Location of therapist: home office.  Pt talked about her health.   She has not been feeling well.  Addressed pt's health concerns.   Pt talked about having some things on her mind.  She feels left out by her niece and it bothers her a lot.  Addressed the issues and helped pt process her feelings.    Worked on additional coping and self care strategies.   Provided supportive therapy.    Interventions: Cognitive Behavioral Therapy and Insight-Oriented  Diagnosis:  F33.1  Plan of Care: Recommend ongoing therapy.   Pt participated in setting therapy goals.  Pt wants to have someone to talk to and improve coping skills.   Plan to continue to meet every two weeks.   Pt is progressing toward treatment goals.   Treatment Plan (Treatment Plan Target Date: 06/09/2022) Client Abilities/Strengths  Pt is bright, engaging, and motivated for therapy.   Client Treatment  Preferences  Individual therapy.  Client Statement of Needs  Improve coping skills.  Symptoms  Depressed or irritable mood. Lack of energy. Feelings of hopelessness, worthlessness, or inappropriate guilt. Low self-esteem. Unresolved grief issues.   Problems Addressed  Unipolar Depression Goals 1. Alleviate depressive symptoms and return to previous level of effective functioning. 2. Appropriately grieve the loss in order to normalize mood and to return to previously adaptive level of functioning. Objective Learn and implement behavioral strategies to overcome depression. Target Date: 2022-06-09 Frequency: Biweekly  Progress: 40 Modality: individual  Related Interventions Engage the client in "behavioral activation," increasing his/her activity level and contact with sources of reward, while identifying processes that inhibit activation.  Use behavioral techniques such as instruction, rehearsal, role-playing, role reversal, as needed, to facilitate activity in the client's daily life; reinforce success. Assist the client in developing skills that increase the likelihood of deriving pleasure from behavioral activation (e.g., assertiveness skills, developing an exercise plan, less internal/more external focus, increased social involvement); reinforce success. Objective Identify important people in life, past and present, and describe the quality, good and poor, of those relationships. Target Date: 2022-06-09 Frequency: Biweekly  Progress: 40 Modality: individual  Related Interventions Conduct Interpersonal Therapy beginning with the assessment of the client's "interpersonal inventory" of important past and present relationships; develop a case formulation linking depression to grief, interpersonal role disputes, role transitions, and/or interpersonal deficits). Objective Learn and implement problem-solving and decision-making skills. Target Date: 2022-06-09 Frequency: Biweekly   Progress: 40 Modality:  individual  Related Interventions Conduct Problem-Solving Therapy using techniques such as psychoeducation, modeling, and role-playing to teach client problem-solving skills (i.e., defining a problem specifically, generating possible solutions, evaluating the pros and cons of each solution, selecting and implementing a plan of action, evaluating the efficacy of the plan, accepting or revising the plan); role-play application of the problem-solving skill to a real life issue. Encourage in the client the development of a positive problem orientation in which problems and solving them are viewed as a natural part of life and not something to be feared, despaired, or avoided. 3. Develop healthy interpersonal relationships that lead to the alleviation and help prevent the relapse of depression. 4. Develop healthy thinking patterns and beliefs about self, others, and the world that lead to the alleviation and help prevent the relapse of depression. 5. Recognize, accept, and cope with feelings of depression. Diagnosis F33.1  Conditions For Discharge Achievement of treatment goals and objectives   Clint Bolder, LCSW

## 2021-11-03 ENCOUNTER — Encounter: Payer: Self-pay | Admitting: Cardiovascular Disease

## 2021-11-03 ENCOUNTER — Ambulatory Visit (INDEPENDENT_AMBULATORY_CARE_PROVIDER_SITE_OTHER): Payer: Medicare Other | Admitting: Cardiovascular Disease

## 2021-11-03 VITALS — BP 134/66 | HR 74 | Ht 60.0 in | Wt 150.2 lb

## 2021-11-03 DIAGNOSIS — R072 Precordial pain: Secondary | ICD-10-CM | POA: Diagnosis not present

## 2021-11-03 DIAGNOSIS — E7800 Pure hypercholesterolemia, unspecified: Secondary | ICD-10-CM

## 2021-11-03 DIAGNOSIS — I25111 Atherosclerotic heart disease of native coronary artery with angina pectoris with documented spasm: Secondary | ICD-10-CM | POA: Diagnosis not present

## 2021-11-03 DIAGNOSIS — I700 Atherosclerosis of aorta: Secondary | ICD-10-CM | POA: Diagnosis not present

## 2021-11-03 DIAGNOSIS — I10 Essential (primary) hypertension: Secondary | ICD-10-CM | POA: Diagnosis not present

## 2021-11-03 LAB — LIPID PANEL
Chol/HDL Ratio: 3.1 ratio (ref 0.0–4.4)
Cholesterol, Total: 196 mg/dL (ref 100–199)
HDL: 63 mg/dL (ref >39)
LDL Chol Calc (NIH): 112 mg/dL — ABNORMAL HIGH (ref 0–99)
Triglycerides: 119 mg/dL (ref 0–149)
VLDL Cholesterol Cal: 21 mg/dL (ref 5–40)

## 2021-11-03 LAB — BASIC METABOLIC PANEL
BUN/Creatinine Ratio: 15 (ref 12–28)
BUN: 10 mg/dL (ref 8–27)
CO2: 28 mmol/L (ref 20–29)
Calcium: 7.8 mg/dL — ABNORMAL LOW (ref 8.7–10.3)
Chloride: 100 mmol/L (ref 96–106)
Creatinine, Ser: 0.68 mg/dL (ref 0.57–1.00)
Glucose: 72 mg/dL (ref 70–99)
Potassium: 4.5 mmol/L (ref 3.5–5.2)
Sodium: 142 mmol/L (ref 134–144)
eGFR: 92 mL/min/{1.73_m2} (ref >59)

## 2021-11-03 MED ORDER — METOPROLOL TARTRATE 50 MG PO TABS: ORAL_TABLET | ORAL | 0 refills | Status: DC

## 2021-11-03 MED ORDER — ISOSORBIDE MONONITRATE ER 30 MG PO TB24: 30.0000 mg | ORAL_TABLET | Freq: Every day | ORAL | 4 refills | Status: DC
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
## 2021-11-03 NOTE — Patient Instructions (Signed)
Medication Instructions:  START Imdur 30 mg once daily  *If you need a refill on your cardiac medications before your next appointment, please call your pharmacy*   Lab Work: Your provider would like for you to have the following labs today: BMET and Lipid  If you have labs (blood work) drawn today and your tests are completely normal, you will receive your results only by: [place] (if you have MyChart) OR A paper copy in the mail If you have any lab test that is abnormal or we need to change your treatment, we will call you to review the results.  Follow-Up: At [facility], you and your health needs are our priority.  As part of our continuing mission to provide you with exceptional heart care, we have created designated Provider Care Teams.  These Care Teams include your primary Cardiologist (physician) and Advanced Practice Providers (APPs -  Physician Assistants and Nurse Practitioners) who all work together to provide you with the care you need, when you need it.  We recommend signing up for the patient portal called "MyChart".  Sign up information is provided on this After Visit Summary.  MyChart is used to connect with patients for Virtual Visits (Telemedicine).  Patients are able to view lab/test results, encounter notes, upcoming appointments, etc.  Non-urgent messages can be sent to your provider as well.   To learn more about what you can do with MyChart, go to [email or web address].    Your next appointment:   4 month(s)  The format for your next appointment:   In Person  Provider:   Dr. Sallyanne Kuster  Other Instructions   Your cardiac CT will be scheduled at one of the below locations:   [facility] [address] [place], [place] [address] [phone]  [place] [address] [place], [place] [address] [phone]  If scheduled at [facility], please arrive at the [facility]  and Children's Entrance (Entrance C2) of [facility] 30 minutes prior to test start time. You can use the FREE valet parking offered at entrance C (encouraged to control the heart rate for the test)  Proceed to the [facility] Radiology Department (first floor) to check-in and test prep.  All radiology patients and guests should use entrance C2 at [facility], accessed from [facility], even though the hospital's physical address listed is [address].    If scheduled at [facility], please arrive 15 mins early for check-in and test prep.  Please follow these instructions carefully (unless otherwise directed):  On the Night Before the Test: Be sure to Drink plenty of water. Do not consume any caffeinated/decaffeinated beverages or chocolate 12 hours prior to your test. Do not take any antihistamines 12 hours prior to your test.   On the Day of the Test: Drink plenty of water until 1 hour prior to the test. Do not eat any food 4 hours prior to the test. You may take your regular medications prior to the test.  Take metoprolol (Lopressor) two hours prior to test. HOLD Furosemide morning of the test. FEMALES- please wear underwire-free bra if available, avoid dresses & tight clothing      After the Test: Drink plenty of water. After receiving IV contrast, you may experience a mild flushed feeling. This is normal. On occasion, you may experience a mild rash up to 24 hours after the test. This is not dangerous.  If this occurs, you can take Benadryl 25 mg and increase your fluid intake. If you experience trouble breathing, this can be serious. If it is severe call 911 IMMEDIATELY. If it is mild, please call our office. If you take any of these medications: Glipizide/Metformin, Avandament, Glucavance, please do not take 48 hours after completing test unless otherwise instructed.  We will call to schedule your test 2-4 weeks out  understanding that some insurance companies will need an authorization prior to the service being performed.   For non-scheduling related questions, please contact the cardiac imaging nurse navigator should you have any questions/concerns: Marchia Bond, Cardiac Imaging Nurse Navigator Gordy Clement, Cardiac Imaging Nurse Navigator [facility] Direct Office Dial: [phone]   For scheduling needs, including cancellations and rescheduling, please call [place], [phone].

## 2021-11-04 ENCOUNTER — Telehealth: Payer: Self-pay | Admitting: *Deleted

## 2021-11-04 ENCOUNTER — Other Ambulatory Visit: Payer: Self-pay | Admitting: Cardiovascular Disease

## 2021-11-04 ENCOUNTER — Encounter: Payer: Self-pay | Admitting: *Deleted

## 2021-11-04 DIAGNOSIS — E7800 Pure hypercholesterolemia, unspecified: Secondary | ICD-10-CM

## 2021-11-04 MED ORDER — EZETIMIBE 10 MG PO TABS: 10.0000 mg | ORAL_TABLET | Freq: Every day | ORAL | 3 refills | Status: DC

## 2021-11-04 NOTE — Telephone Encounter (Signed)
-----   Message from Sanda Klein, MD sent at 11/04/2021 12:54 PM EDT ----- Routine labs are OK (low calcium is a chronic abnormality). LDL cholesterol is too high, target LDL <70 due to known CAD. Please ad ezetimibe 10 mg daily.

## 2021-11-04 NOTE — Telephone Encounter (Signed)
Spoke with pt, aware of dr croitou's recommendations.  New script sent to the pharmacy  Letter of results sent to patient at her request

## 2021-11-05 ENCOUNTER — Other Ambulatory Visit: Payer: Self-pay | Admitting: Cardiovascular Disease

## 2021-11-05 ENCOUNTER — Other Ambulatory Visit: Payer: Self-pay | Admitting: Internal Medicine

## 2021-11-05 DIAGNOSIS — L509 Urticaria, unspecified: Secondary | ICD-10-CM

## 2021-11-05 DIAGNOSIS — J449 Chronic obstructive pulmonary disease, unspecified: Secondary | ICD-10-CM

## 2021-11-05 DIAGNOSIS — E539 Vitamin B deficiency, unspecified: Secondary | ICD-10-CM

## 2021-11-05 DIAGNOSIS — E875 Hyperkalemia: Secondary | ICD-10-CM

## 2021-11-05 DIAGNOSIS — E559 Vitamin D deficiency, unspecified: Secondary | ICD-10-CM

## 2021-11-05 NOTE — Telephone Encounter (Signed)
Check [place] registry last filled 07/28/2021.Marland KitchenJohny [name]

## 2021-11-09 ENCOUNTER — Ambulatory Visit (INDEPENDENT_AMBULATORY_CARE_PROVIDER_SITE_OTHER): Payer: Medicare Other | Admitting: Psychology

## 2021-11-09 DIAGNOSIS — F331 Major depressive disorder, recurrent, moderate: Secondary | ICD-10-CM | POA: Diagnosis not present

## 2021-11-09 NOTE — Progress Notes (Signed)
[facility] Counselor/Therapist Progress Note  Patient ID: Veronica [name], MRN: [id number],    Date: 11/09/2021  Time Spent: 12:00pm - 12:45pm    45 minutes   Treatment Type: Individual Therapy  Reported Symptoms: worrying  Mental Status Exam: Appearance:  Casual     Behavior: Appropriate  Motor: Normal  Speech/Language:  Normal Rate  Affect: Appropriate  Mood: normal  Thought process: normal  Thought content:   WNL  Sensory/Perceptual disturbances:   WNL  Orientation: oriented to person, place, time/date, and situation  Attention: Good  Concentration: Good  Memory: WNL  Fund of knowledge:  Good  Insight:   Good  Judgment:  Good  Impulse Control: Good   Risk Assessment: Danger to Self:  No Self-injurious Behavior: No Danger to Others: No Duty to Warn:no Physical Aggression / Violence:No  Access to Firearms a concern: No  Gang Involvement:No   Subjective: Pt present for individual therapy via  phone.  Pt consents to telehealth session due to [place] 19 pandemic. Location of pt: home Location of therapist: home office.  Pt talked about her health.   She saw the cardiologist and had a lot of tests.  Pt is scheduled for a cardiac CT in September.   Pt states she is trying to not worry about it.   Worked on additional coping and self care strategies.   Pt talked about her relationship with her niece.   She called her and they had a nice conversation.  Pt was anxious about calling her before hand but is glad she faced her fears.   Provided supportive therapy.    Interventions: Cognitive Behavioral Therapy and Insight-Oriented  Diagnosis:  F33.1  Plan of Care: Recommend ongoing therapy.   Pt participated in setting therapy goals.  Pt wants to have someone to talk to and improve coping skills.   Plan to continue to meet every two weeks.   Pt is progressing toward treatment goals.   Treatment Plan (Treatment Plan Target Date: 06/09/2022) Client  Abilities/Strengths  Pt is bright, engaging, and motivated for therapy.   Client Treatment Preferences  Individual therapy.  Client Statement of Needs  Improve coping skills.  Symptoms  Depressed or irritable mood. Lack of energy. Feelings of hopelessness, worthlessness, or inappropriate guilt. Low self-esteem. Unresolved grief issues.   Problems Addressed  Unipolar Depression Goals 1. Alleviate depressive symptoms and return to previous level of effective functioning. 2. Appropriately grieve the loss in order to normalize mood and to return to previously adaptive level of functioning. Objective Learn and implement behavioral strategies to overcome depression. Target Date: 2022-06-09 Frequency: Biweekly  Progress: 40 Modality: individual  Related Interventions Engage the client in "behavioral activation," increasing his/her activity level and contact with sources of reward, while identifying processes that inhibit activation.  Use behavioral techniques such as instruction, rehearsal, role-playing, role reversal, as needed, to facilitate activity in the client's daily life; reinforce success. Assist the client in developing skills that increase the likelihood of deriving pleasure from behavioral activation (e.g., assertiveness skills, developing an exercise plan, less internal/more external focus, increased social involvement); reinforce success. Objective Identify important people in life, past and present, and describe the quality, good and poor, of those relationships. Target Date: 2022-06-09 Frequency: Biweekly  Progress: 40 Modality: individual  Related Interventions Conduct Interpersonal Therapy beginning with the assessment of the client's "interpersonal inventory" of important past and present relationships; develop a case formulation linking depression to grief, interpersonal role disputes, role  transitions, and/or interpersonal deficits). Objective Learn and implement  problem-solving and decision-making skills. Target Date: 2022-06-09 Frequency: Biweekly  Progress: 40 Modality: individual  Related Interventions Conduct Problem-Solving Therapy using techniques such as psychoeducation, modeling, and role-playing to teach client problem-solving skills (i.e., defining a problem specifically, generating possible solutions, evaluating the pros and cons of each solution, selecting and implementing a plan of action, evaluating the efficacy of the plan, accepting or revising the plan); role-play application of the problem-solving skill to a real life issue. Encourage in the client the development of a positive problem orientation in which problems and solving them are viewed as a natural part of life and not something to be feared, despaired, or avoided. 3. Develop healthy interpersonal relationships that lead to the alleviation and help prevent the relapse of depression. 4. Develop healthy thinking patterns and beliefs about self, others, and the world that lead to the alleviation and help prevent the relapse of depression. 5. Recognize, accept, and cope with feelings of depression. Diagnosis F33.1  Conditions For Discharge Achievement of treatment goals and objectives   Clint Bolder, LCSW

## 2021-11-10 ENCOUNTER — Ambulatory Visit: Payer: Medicare Other | Admitting: Cardiovascular Disease

## 2021-11-18 ENCOUNTER — Telehealth: Payer: Self-pay | Admitting: *Deleted

## 2021-11-18 ENCOUNTER — Encounter: Payer: Self-pay | Admitting: *Deleted

## 2021-11-18 NOTE — Patient Outreach (Signed)
  Care Coordination   Initial Visit Note   11/18/2021 Name: Veronica [name] MRN: [id number] DOB: [birth year]  Veronica [name] is a 71 y.o. year old female who sees Plotnikov, Evie Lacks, MD for primary care. I spoke with  Veronica [name] by phone today.  What matters to the patients health and wellness today?  "I want to stay as healthy as I can so I can stay independent; I eat a low salt diet and exercise every day, and I take my medications like they tell me to; I am doing okay for now, but I am going to keep your number in case something comes up in the future"  No further/ ongoing care coordination needs identified today- patient was provided with my direct phone number for any needs that arise in the future    Goals Addressed             This Visit's Progress    COMPLETED: Care Coordination Activities: No follow Up Required       Care Coordination Interventions: Basic overview and discussion of pathophysiology of Heart Failure reviewed Provided education on low sodium diet Assessed need for readable accurate scales in home Advised patient to weigh each morning after emptying bladder Discussed importance of daily weight and advised patient to weigh and record daily Reviewed role of diuretics in prevention of fluid overload and management of heart failure; Discussed the importance of keeping all appointments with provider Provided patient with education about the role of exercise in the management of heart failure Advised patient to discuss results of pending/upcoming cardiac CT with provider Assessed social determinant of health barriers  Provided education and confirmed patient able top verbalize weight gain guidelines/ corresponding action plan in setting of CHF; reviewed recent weights at home- reports consistent ranges between 147-150 lbs and confirms no overnight weight gain > 3 lbs/ weekly weight gain > 5 lbs; provided education and reviewed prep for upcoming cardiac CT scan;  confirmed patient has no medication concerns and endorses adherence to current medication regimen; encouraged patient to ensure she obtains flu vaccine for 2023-24 flu season; reviewed upcoming provider appointments and confirmed patient aware of all and has plans to attend as scheduled; mailed AVS with heart failure education, as patient reports she has never received this information in printed format           SDOH assessments and interventions completed:  Yes  SDOH Interventions Today    Flowsheet Row Most Recent Value  SDOH Interventions   Food Insecurity Interventions Intervention Not Indicated  Transportation Interventions Intervention Not Indicated  [brother provides transportation]       Care Coordination Interventions Activated:  Yes  Care Coordination Interventions:  Yes, provided   Follow up plan: No further intervention required.   Encounter Outcome:  Pt. Visit Completed   Oneta Rack, RN, BSN, CCRN Alumnus RN CM Care Coordination/ Transition of [place] Management [phone]: direct office

## 2021-11-18 NOTE — Patient Instructions (Signed)
Visit Information  Thank you for taking time to visit with me today. Please don't hesitate to contact me if I can be of assistance to you.   Following are the goals we discussed today:   Goals Addressed               This Visit's Progress     COMPLETED: Care Coordination Activities: No follow Up Required        Care Coordination Interventions: Basic overview and discussion of pathophysiology of Heart Failure reviewed Provided education on low sodium diet Assessed need for readable accurate scales in home Advised patient to weigh each morning after emptying bladder Discussed importance of daily weight and advised patient to weigh and record daily Reviewed role of diuretics in prevention of fluid overload and management of heart failure; Discussed the importance of keeping all appointments with provider Provided patient with education about the role of exercise in the management of heart failure Advised patient to discuss results of pending/upcoming cardiac CT with provider Assessed social determinant of health barriers  Provided education and confirmed patient able top verbalize weight gain guidelines/ corresponding action plan in setting of CHF; reviewed recent weights at home- reports consistent ranges between 147-150 lbs and confirms no overnight weight gain > 3 lbs/ weekly weight gain > 5 lbs; provided education and reviewed prep for upcoming cardiac CT scan; confirmed patient has no medication concerns and endorses adherence to current medication regimen; encouraged patient to ensure she obtains flu vaccine for 2023-24 flu season; reviewed upcoming provider appointments and confirmed patient aware of all and has plans to attend as scheduled; mailed AVS with heart failure education, as patient reports she has never received this information in printed format                      If you are experiencing a Mental Health or [place] or need someone to talk to, please   call the Suicide and Crisis Lifeline: 988 call the [place] National Suicide Prevention Lifeline: (769)839-4716 or TTY: 204 051 7643 TTY 9093860624) to talk to a trained counselor call 1-800-273-TALK (toll free, 24 hour hotline) go to [facility] [address], [place] [phone]) call the [place]: [phone] call 911   The patient verbalized understanding of instructions, educational materials, and care plan provided today and agreed to receive a mailed copy of patient instructions, educational materials, and care plan.   No further follow up required: no ongoing care coordination needs identified; patient provided my direct phone number for any needs that arise in the future  Oneta Rack, RN, BSN, [place] RN CM Care Coordination/ Transition of [place] Management [phone]: direct office

## 2021-11-19 ENCOUNTER — Other Ambulatory Visit: Payer: Self-pay | Admitting: Internal Medicine

## 2021-11-23 ENCOUNTER — Ambulatory Visit (INDEPENDENT_AMBULATORY_CARE_PROVIDER_SITE_OTHER): Payer: Medicare Other | Admitting: Psychology

## 2021-11-23 ENCOUNTER — Telehealth (HOSPITAL_COMMUNITY): Payer: Self-pay | Admitting: *Deleted

## 2021-11-23 DIAGNOSIS — F331 Major depressive disorder, recurrent, moderate: Secondary | ICD-10-CM | POA: Diagnosis not present

## 2021-11-23 NOTE — Telephone Encounter (Signed)
Patient calling about her upcoming cardiac imaging study; pt verbalizes understanding of appt date/time, parking situation and where to check in, pre-test NPO status and medications ordered, and verified current allergies; name and call back number provided for further questions should they arise  Gordy Clement RN Navigator Cardiac Imaging [facility] [phone] office [phone] cell  Patient to take '50mg'$  metoprolol tartrate two hours prior to her cardiac CT scan. I asked patient about her allergy to contrast, and she said it was from many years ago and they do not use that substance anymore. She asked what was the name of the contrast and upon hearing that it was Omnipaque, patient was relieved to hear. She states she's had a contrasted CT scan within the past year without any issues. She is aware to arrive at 11:30am.

## 2021-11-23 NOTE — Progress Notes (Signed)
[facility] Counselor/Therapist Progress Note  Patient ID: Veronica [name], MRN: [id number],    Date: 11/23/2021  Time Spent: 12:00pm - 12:45pm    45 minutes   Treatment Type: Individual Therapy  Reported Symptoms: worrying  Mental Status Exam: Appearance:  Casual     Behavior: Appropriate  Motor: Normal  Speech/Language:  Normal Rate  Affect: Appropriate  Mood: normal  Thought process: normal  Thought content:   WNL  Sensory/Perceptual disturbances:   WNL  Orientation: oriented to person, place, time/date, and situation  Attention: Good  Concentration: Good  Memory: WNL  Fund of knowledge:  Good  Insight:   Good  Judgment:  Good  Impulse Control: Good   Risk Assessment: Danger to Self:  No Self-injurious Behavior: No Danger to Others: No Duty to Warn:no Physical Aggression / Violence:No  Access to Firearms a concern: No  Gang Involvement:No   Subjective: Pt present for individual therapy via  phone.  Pt consents to telehealth session due to [place] 19 pandemic. Location of pt: home Location of therapist: home office.  Pt talked about her health.  She has a cardiac CT tomorrow.  Pt is trying not to worry about it.  Addressed pt's concerns and worked on worry management.  Pt's brother will take her to her procedure.   Worked on additional coping and self care strategies.   Provided supportive therapy.    Interventions: Cognitive Behavioral Therapy and Insight-Oriented  Diagnosis:  F33.1  Plan of Care: Recommend ongoing therapy.   Pt participated in setting therapy goals.  Pt wants to have someone to talk to and improve coping skills.   Plan to continue to meet every two weeks.   Pt is progressing toward treatment goals.   Treatment Plan (Treatment Plan Target Date: 06/09/2022) Client Abilities/Strengths  Pt is bright, engaging, and motivated for therapy.   Client Treatment Preferences  Individual therapy.  Client Statement of Needs   Improve coping skills.  Symptoms  Depressed or irritable mood. Lack of energy. Feelings of hopelessness, worthlessness, or inappropriate guilt. Low self-esteem. Unresolved grief issues.   Problems Addressed  Unipolar Depression Goals 1. Alleviate depressive symptoms and return to previous level of effective functioning. 2. Appropriately grieve the loss in order to normalize mood and to return to previously adaptive level of functioning. Objective Learn and implement behavioral strategies to overcome depression. Target Date: 2022-06-09 Frequency: Biweekly  Progress: 40 Modality: individual  Related Interventions Engage the client in "behavioral activation," increasing his/her activity level and contact with sources of reward, while identifying processes that inhibit activation.  Use behavioral techniques such as instruction, rehearsal, role-playing, role reversal, as needed, to facilitate activity in the client's daily life; reinforce success. Assist the client in developing skills that increase the likelihood of deriving pleasure from behavioral activation (e.g., assertiveness skills, developing an exercise plan, less internal/more external focus, increased social involvement); reinforce success. Objective Identify important people in life, past and present, and describe the quality, good and poor, of those relationships. Target Date: 2022-06-09 Frequency: Biweekly  Progress: 40 Modality: individual  Related Interventions Conduct Interpersonal Therapy beginning with the assessment of the client's "interpersonal inventory" of important past and present relationships; develop a case formulation linking depression to grief, interpersonal role disputes, role transitions, and/or interpersonal deficits). Objective Learn and implement problem-solving and decision-making skills. Target Date: 2022-06-09 Frequency: Biweekly  Progress: 40 Modality: individual  Related Interventions Conduct  Problem-Solving Therapy using techniques such as psychoeducation, modeling, and role-playing  to teach client problem-solving skills (i.e., defining a problem specifically, generating possible solutions, evaluating the pros and cons of each solution, selecting and implementing a plan of action, evaluating the efficacy of the plan, accepting or revising the plan); role-play application of the problem-solving skill to a real life issue. Encourage in the client the development of a positive problem orientation in which problems and solving them are viewed as a natural part of life and not something to be feared, despaired, or avoided. 3. Develop healthy interpersonal relationships that lead to the alleviation and help prevent the relapse of depression. 4. Develop healthy thinking patterns and beliefs about self, others, and the world that lead to the alleviation and help prevent the relapse of depression. 5. Recognize, accept, and cope with feelings of depression. Diagnosis F33.1  Conditions For Discharge Achievement of treatment goals and objectives   Clint Bolder, LCSW

## 2021-11-24 ENCOUNTER — Other Ambulatory Visit: Payer: Self-pay | Admitting: Cardiology

## 2021-11-24 ENCOUNTER — Ambulatory Visit (HOSPITAL_COMMUNITY)
Admission: RE | Admit: 2021-11-24 | Discharge: 2021-11-24 | Disposition: A | Discharge status: Home / Self Care | Payer: Medicare Other | Source: Ambulatory Visit | Attending: Cardiology | Admitting: Cardiology

## 2021-11-24 ENCOUNTER — Ambulatory Visit (HOSPITAL_COMMUNITY)
Admission: RE | Admit: 2021-11-24 | Discharge: 2021-11-24 | Disposition: A | Discharge status: Home / Self Care | Payer: Medicare Other | Source: Ambulatory Visit | Attending: Cardiovascular Disease | Admitting: Cardiovascular Disease

## 2021-11-24 DIAGNOSIS — R072 Precordial pain: Secondary | ICD-10-CM | POA: Diagnosis present

## 2021-11-24 DIAGNOSIS — I2510 Atherosclerotic heart disease of native coronary artery without angina pectoris: Secondary | ICD-10-CM | POA: Diagnosis not present

## 2021-11-24 DIAGNOSIS — R931 Abnormal findings on diagnostic imaging of heart and coronary circulation: Secondary | ICD-10-CM

## 2021-11-24 MED ORDER — IOHEXOL 350 MG/ML SOLN
100.0000 mL | Freq: Once | INTRAVENOUS | Status: AC | PRN
  Administered 2021-11-24: 100 mL via INTRAVENOUS

## 2021-11-24 MED ORDER — NITROGLYCERIN 0.4 MG SL SUBL
SUBLINGUAL_TABLET | SUBLINGUAL | Status: AC
  Filled 2021-11-24: qty 2

## 2021-11-24 MED ORDER — NITROGLYCERIN 0.4 MG SL SUBL
0.8000 mg | SUBLINGUAL_TABLET | Freq: Once | SUBLINGUAL | Status: AC
  Administered 2021-11-24: 0.8 mg via SUBLINGUAL

## 2021-11-26 ENCOUNTER — Encounter: Payer: Self-pay | Admitting: *Deleted

## 2021-11-26 ENCOUNTER — Telehealth: Payer: Self-pay | Admitting: Cardiovascular Disease

## 2021-11-26 NOTE — Telephone Encounter (Signed)
Patient called to get results of her CT CARDIAC [facility] test.

## 2021-11-26 NOTE — Telephone Encounter (Signed)
Primary nurse, Lattie Haw, spoke with pt.

## 2021-12-01 ENCOUNTER — Encounter: Payer: Self-pay | Admitting: Pulmonary Disease

## 2021-12-01 ENCOUNTER — Ambulatory Visit (INDEPENDENT_AMBULATORY_CARE_PROVIDER_SITE_OTHER): Payer: Medicare Other | Admitting: Pulmonary Disease

## 2021-12-01 VITALS — BP 118/62 | HR 69 | Temp 98.4°F | Ht 60.0 in | Wt 150.0 lb

## 2021-12-01 DIAGNOSIS — J4541 Moderate persistent asthma with (acute) exacerbation: Secondary | ICD-10-CM | POA: Diagnosis not present

## 2021-12-01 DIAGNOSIS — J479 Bronchiectasis, uncomplicated: Secondary | ICD-10-CM | POA: Diagnosis not present

## 2021-12-01 NOTE — Patient Instructions (Signed)
Nice to see you again  Continue to performist and Pulmicort nebulizer twice a day every day  Continue use the vest, and use it twice a day every day if you can.  Please let me know if sputum production or cough worsens we may need to resume using the nebulized saline or salt water to help with the mucus  Return to clinic in 3 months or sooner as needed with Dr. Silas Flood
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
## 2021-12-07 ENCOUNTER — Ambulatory Visit (INDEPENDENT_AMBULATORY_CARE_PROVIDER_SITE_OTHER): Payer: Medicare Other | Admitting: Psychology

## 2021-12-07 DIAGNOSIS — F331 Major depressive disorder, recurrent, moderate: Secondary | ICD-10-CM | POA: Diagnosis not present

## 2021-12-07 NOTE — Progress Notes (Signed)
[facility] Counselor/Therapist Progress Note  Patient ID: Veronica [name], MRN: [id number],    Date: 12/07/2021  Time Spent: 12:00pm - 12:45pm    45 minutes   Treatment Type: Individual Therapy  Reported Symptoms: worrying  Mental Status Exam: Appearance:  Casual     Behavior: Appropriate  Motor: Normal  Speech/Language:  Normal Rate  Affect: Appropriate  Mood: normal  Thought process: normal  Thought content:   WNL  Sensory/Perceptual disturbances:   WNL  Orientation: oriented to person, place, time/date, and situation  Attention: Good  Concentration: Good  Memory: WNL  Fund of knowledge:  Good  Insight:   Good  Judgment:  Good  Impulse Control: Good   Risk Assessment: Danger to Self:  No Self-injurious Behavior: No Danger to Others: No Duty to Warn:no Physical Aggression / Violence:No  Access to Firearms a concern: No  Gang Involvement:No   Subjective: Pt present for individual therapy via  phone.  Pt consents to telehealth session due to [place] 19 pandemic. Location of pt: home Location of therapist: home office.  Pt talked about her health.  She got the results from her heart CT.   Her doctor has put her on additional medication to address the issues.  Addressed pt's concerns and worked on worry management.    Pt talked about her relationship with her brother.   At times her brother says things that make her upset.   Helped pt process her feelings and relationship dynamics.   Pt is working on living in the present moment.   Worked on additional coping and self care strategies.   Provided supportive therapy.    Interventions: Cognitive Behavioral Therapy and Insight-Oriented  Diagnosis:  F33.1  Plan of Care: Recommend ongoing therapy.   Pt participated in setting therapy goals.  Pt wants to have someone to talk to and improve coping skills.   Plan to continue to meet every two weeks.   Pt is progressing toward treatment goals.    Treatment Plan (Treatment Plan Target Date: 06/09/2022) Client Abilities/Strengths  Pt is bright, engaging, and motivated for therapy.   Client Treatment Preferences  Individual therapy.  Client Statement of Needs  Improve coping skills.  Symptoms  Depressed or irritable mood. Lack of energy. Feelings of hopelessness, worthlessness, or inappropriate guilt. Low self-esteem. Unresolved grief issues.   Problems Addressed  Unipolar Depression Goals 1. Alleviate depressive symptoms and return to previous level of effective functioning. 2. Appropriately grieve the loss in order to normalize mood and to return to previously adaptive level of functioning. Objective Learn and implement behavioral strategies to overcome depression. Target Date: 2022-06-09 Frequency: Biweekly  Progress: 40 Modality: individual  Related Interventions Engage the client in "behavioral activation," increasing his/her activity level and contact with sources of reward, while identifying processes that inhibit activation.  Use behavioral techniques such as instruction, rehearsal, role-playing, role reversal, as needed, to facilitate activity in the client's daily life; reinforce success. Assist the client in developing skills that increase the likelihood of deriving pleasure from behavioral activation (e.g., assertiveness skills, developing an exercise plan, less internal/more external focus, increased social involvement); reinforce success. Objective Identify important people in life, past and present, and describe the quality, good and poor, of those relationships. Target Date: 2022-06-09 Frequency: Biweekly  Progress: 40 Modality: individual  Related Interventions Conduct Interpersonal Therapy beginning with the assessment of the client's "interpersonal inventory" of important past and present relationships; develop a case formulation linking depression to  grief, interpersonal role disputes, role transitions,  and/or interpersonal deficits). Objective Learn and implement problem-solving and decision-making skills. Target Date: 2022-06-09 Frequency: Biweekly  Progress: 40 Modality: individual  Related Interventions Conduct Problem-Solving Therapy using techniques such as psychoeducation, modeling, and role-playing to teach client problem-solving skills (i.e., defining a problem specifically, generating possible solutions, evaluating the pros and cons of each solution, selecting and implementing a plan of action, evaluating the efficacy of the plan, accepting or revising the plan); role-play application of the problem-solving skill to a real life issue. Encourage in the client the development of a positive problem orientation in which problems and solving them are viewed as a natural part of life and not something to be feared, despaired, or avoided. 3. Develop healthy interpersonal relationships that lead to the alleviation and help prevent the relapse of depression. 4. Develop healthy thinking patterns and beliefs about self, others, and the world that lead to the alleviation and help prevent the relapse of depression. 5. Recognize, accept, and cope with feelings of depression. Diagnosis F33.1  Conditions For Discharge Achievement of treatment goals and objectives   Clint Bolder, LCSW

## 2021-12-08 ENCOUNTER — Ambulatory Visit (INDEPENDENT_AMBULATORY_CARE_PROVIDER_SITE_OTHER): Payer: Medicare Other | Admitting: Internal Medicine

## 2021-12-08 ENCOUNTER — Encounter: Payer: Self-pay | Admitting: Internal Medicine

## 2021-12-08 VITALS — BP 132/68 | HR 63 | Temp 98.0°F | Ht 60.0 in | Wt 149.4 lb

## 2021-12-08 DIAGNOSIS — R932 Abnormal findings on diagnostic imaging of liver and biliary tract: Secondary | ICD-10-CM

## 2021-12-08 DIAGNOSIS — E559 Vitamin D deficiency, unspecified: Secondary | ICD-10-CM | POA: Diagnosis not present

## 2021-12-08 DIAGNOSIS — J479 Bronchiectasis, uncomplicated: Secondary | ICD-10-CM

## 2021-12-08 DIAGNOSIS — Z23 Encounter for immunization: Secondary | ICD-10-CM | POA: Diagnosis not present

## 2021-12-08 DIAGNOSIS — J449 Chronic obstructive pulmonary disease, unspecified: Secondary | ICD-10-CM

## 2021-12-08 DIAGNOSIS — I25111 Atherosclerotic heart disease of native coronary artery with angina pectoris with documented spasm: Secondary | ICD-10-CM

## 2021-12-08 DIAGNOSIS — E539 Vitamin B deficiency, unspecified: Secondary | ICD-10-CM

## 2021-12-08 DIAGNOSIS — L509 Urticaria, unspecified: Secondary | ICD-10-CM | POA: Diagnosis not present

## 2021-12-08 DIAGNOSIS — G8929 Other chronic pain: Secondary | ICD-10-CM

## 2021-12-08 DIAGNOSIS — M545 Low back pain, unspecified: Secondary | ICD-10-CM

## 2021-12-08 DIAGNOSIS — E875 Hyperkalemia: Secondary | ICD-10-CM

## 2021-12-08 MED ORDER — ACETAMINOPHEN-CODEINE 300-30 MG PO TABS: 1.0000 | ORAL_TABLET | Freq: Four times a day (QID) | ORAL | 3 refills | Status: DC | PRN

## 2021-12-08 MED ORDER — FUROSEMIDE 20 MG PO TABS: ORAL_TABLET | ORAL | 1 refills | Status: DC

## 2021-12-08 NOTE — Assessment & Plan Note (Signed)
Chronic  Cont on Tylenol #3 prn  Potential benefits of a long term opioids use as well as potential risks (i.e. addiction risk, apnea etc) and complications (i.e. Somnolence, constipation and others) were explained to the patient and were aknowledged.

## 2021-12-08 NOTE — Assessment & Plan Note (Addendum)
GI consult w/DrPyrtle  is pending

## 2021-12-08 NOTE — Assessment & Plan Note (Signed)
Zetia caused diarrhea: try 1/2 tab a day

## 2021-12-08 NOTE — Assessment & Plan Note (Addendum)
Promethazine with codeine prescription renewed  Potential benefits of a long term opioids use as well as potential risks (i.e. addiction risk, apnea etc) and complications (i.e. Somnolence, constipation and others) were explained to the patient and were aknowledged. F/u w/Dr [employer]

## 2021-12-08 NOTE — Progress Notes (Signed)
Subjective:  Patient ID: Veronica [name], female    DOB: 1950-09-23  Age: 71 y.o. MRN: [id number]  CC: Follow-up (3 month f/u- Flu shot)   HPI Veronica [name] presents for chronic cough, CA, dyslipidemia, LBP C/o Zetia caused diarrhea  Outpatient Medications Prior to Visit  Medication Sig Dispense Refill   albuterol (PROVENTIL) (2.5 MG/3ML) 0.083% nebulizer solution USE 1 VIAL IN NEBULIZER 2 TIMES DAILY. Generic: VENTOLIN 180 mL 3   albuterol (VENTOLIN HFA) 108 (90 Base) MCG/ACT inhaler Inhale 2 puffs into the lungs every 4 (four) hours as needed for wheezing or shortness of breath. 18 g 11   aspirin 81 MG chewable tablet Chew by mouth daily.     atorvastatin (LIPITOR) 40 MG tablet Take 1 tablet (40 mg total) by mouth daily. Annual appt w/labs are due  must see provider for future refills 90 tablet 0   B-D INSULIN SYRINGE 1CC/25GX1" 25G X 1" 1 ML MISC USE AS DIRECTED EVERY 2 WEEKS 50 each 5   budesonide (PULMICORT) 0.5 MG/2ML nebulizer solution Take 2 mLs (0.5 mg total) by nebulization in the morning and at bedtime. 120 mL 11   calcium carbonate (OSCAL) 1500 (600 Ca) MG TABS tablet Take 600 mg of elemental calcium by mouth 2 (two) times daily with a meal.      cariprazine (VRAYLAR) 1.5 MG capsule Take 1 capsule (1.5 mg total) by mouth daily. NDC#- 57322-025-42 Exp 10/2023 Lot# [id number] 7 capsule 4   chlorhexidine (PERIDEX) 0.12 % solution Use to wash mouth twice a day     Cholecalciferol (VITAMIN D3) 50 MCG (2000 UT) capsule Take 1 capsule (2,000 Units total) by mouth daily. 100 capsule 3   clonazePAM (KLONOPIN) 1 MG tablet TAKE 2 TABLETS BY MOUTH EVERY NIGHT AT BEDTIME AND 1/2 TABLET EXTRA IN DAYTIME AS NEEDED 225 tablet 1   cyanocobalamin (,VITAMIN B-12,) 1000 MCG/ML injection INJECT 1ML IN THE MUSCLE EVERY 14 DAYS. 10 mL 5   diphenoxylate-atropine (LOMOTIL) 2.5-0.025 MG tablet Take 1-2 tablets by mouth 4 (four) times daily as needed for diarrhea or loose stools. 60 tablet 1   Docusate  Calcium (STOOL SOFTENER PO) Take 100 mg by mouth as needed (for constipation).     escitalopram (LEXAPRO) 20 MG tablet Take 1 tablet (20 mg total) by mouth daily. 90 tablet 3   ezetimibe (ZETIA) 10 MG tablet Take 1 tablet (10 mg total) by mouth daily. 90 tablet 3   isosorbide mononitrate (IMDUR) 30 MG 24 hr tablet Take 1 tablet (30 mg total) by mouth daily. 30 tablet 4   levofloxacin (LEVAQUIN) 500 MG tablet Take 1 tablet (500 mg total) by mouth daily. 7 tablet 0   levothyroxine (SYNTHROID) 88 MCG tablet TAKE 1 TABLET(88 MCG) BY MOUTH DAILY 90 tablet 3   metoprolol tartrate (LOPRESSOR) 50 MG tablet TAKE 1 TABLET 2 HOURS PRIOR TO TEST 1 tablet 0   nitroGLYCERIN (NITROSTAT) 0.4 MG SL tablet DISSOLVE 1 TABLET UNDER THE TOUNGE AS DIRECTED 25 tablet 2   Omega-3 Fatty Acids (FISH OIL) 1000 MG CAPS Take 1 capsule by mouth daily.     phenytoin (DILANTIN) 100 MG ER capsule TAKE 2 CAPSULES BY MOUTH EVERY MORNING AND 1 CAPSULE EVERY EVENING 270 capsule 3   promethazine (PHENERGAN) 12.5 MG tablet Take by mouth.     Respiratory Therapy Supplies (FLUTTER) DEVI Use after nebulization treatments 1 each 0   sodium chloride (MURO 128) 5 % ophthalmic solution Place 1 drop into both eyes as  needed for irritation.     SYRINGE-NEEDLE, DISP, 3 ML (BD ECLIPSE SYRINGE) 25G X 1" 3 ML MISC Use sq q 2 wks 50 each 3   traZODone (DESYREL) 50 MG tablet TAKE 4 TABLETS(200 MG) BY MOUTH AT BEDTIME 360 tablet 3   verapamil (CALAN-SR) 240 MG CR tablet TAKE 1 TABLET(240 MG) BY MOUTH AT BEDTIME 90 tablet 2   acetaminophen-codeine (TYLENOL #3) 300-30 MG tablet TAKE 1 TABLET BY MOUTH EVERY 6 HOURS AS NEEDED 60 tablet 2   furosemide (LASIX) 20 MG tablet TAKE 1 TABLET(20 MG) BY MOUTH DAILY AS NEEDED 90 tablet 1   No facility-administered medications prior to visit.    ROS: Review of Systems  Constitutional:  Positive for fatigue. Negative for activity change, appetite change, chills and unexpected weight change.  HENT:  Positive for  congestion and sinus pressure. Negative for mouth sores.   Eyes:  Negative for visual disturbance.  Respiratory:  Positive for cough, shortness of breath and wheezing. Negative for chest tightness.   Cardiovascular:  Positive for palpitations.  Gastrointestinal:  Positive for diarrhea. Negative for abdominal pain and nausea.  Genitourinary:  Negative for difficulty urinating, frequency and vaginal pain.  Musculoskeletal:  Positive for gait problem. Negative for back pain.  Skin:  Negative for pallor and rash.  Neurological:  Positive for weakness. Negative for dizziness, tremors, numbness and headaches.  Psychiatric/Behavioral:  Negative for confusion, sleep disturbance and suicidal ideas. The patient is nervous/anxious.     Objective:  BP 132/68 (BP Location: Left Arm)   Pulse 63   Temp 98 F (36.7 C) (Oral)   Ht 5' (1.524 m)   Wt 149 lb 6.4 oz (67.8 kg)   SpO2 98%   BMI 29.18 kg/m   BP Readings from Last 3 Encounters:  12/08/21 132/68  12/01/21 118/62  11/24/21 (!) 99/41    Wt Readings from Last 3 Encounters:  12/08/21 149 lb 6.4 oz (67.8 kg)  12/01/21 150 lb (68 kg)  11/03/21 150 lb 3.2 oz (68.1 kg)    Physical Exam Constitutional:      General: She is not in acute distress.    Appearance: Normal appearance. She is well-developed.  HENT:     Head: Normocephalic.     Right Ear: External ear normal.     Left Ear: External ear normal.     Nose: Nose normal.  Eyes:     General:        Right eye: No discharge.        Left eye: No discharge.     Conjunctiva/sclera: Conjunctivae normal.     Pupils: Pupils are equal, round, and reactive to light.  Neck:     Thyroid: No thyromegaly.     Vascular: No JVD.     Trachea: No tracheal deviation.  Cardiovascular:     Rate and Rhythm: Normal rate and regular rhythm.     Heart sounds: Normal heart sounds.  Pulmonary:     Effort: No respiratory distress.     Breath sounds: No stridor. No wheezing.  Abdominal:     General:  Bowel sounds are normal. There is no distension.     Palpations: Abdomen is soft. There is no mass.     Tenderness: There is no abdominal tenderness. There is no guarding or rebound.  Musculoskeletal:        General: No tenderness.     Cervical back: Normal range of motion and neck supple. No rigidity.  Lymphadenopathy:  Cervical: No cervical adenopathy.  Skin:    Findings: No erythema or rash.  Neurological:     Mental Status: She is oriented to person, place, and time.     Cranial Nerves: No cranial nerve deficit.     Motor: Weakness present. No abnormal muscle tone.     Coordination: Coordination normal.     Deep Tendon Reflexes: Reflexes normal.  Psychiatric:        Behavior: Behavior normal.        Thought Content: Thought content normal.        Judgment: Judgment normal.   Coughing Using a cane  Lab Results  Component Value Date   WBC 7.7 06/10/2021   HGB 13.2 06/10/2021   HCT 39.0 06/10/2021   PLT 284.0 06/10/2021   GLUCOSE 72 11/03/2021   CHOL 196 11/03/2021   TRIG 119 11/03/2021   HDL 63 11/03/2021   LDLDIRECT 165.0 09/26/2009   LDLCALC 112 (H) 11/03/2021   ALT 11 06/10/2021   AST 13 06/10/2021   NA 142 11/03/2021   K 4.5 11/03/2021   CL 100 11/03/2021   CREATININE 0.68 11/03/2021   BUN 10 11/03/2021   CO2 28 11/03/2021   TSH 2.75 06/10/2021   INR 1.0 ratio 11/07/2009   HGBA1C 5.7 06/10/2021    CT CORONARY MORPH W/CTA COR W/SCORE W/CA W/CM &/OR WO/CM  Addendum Date: 11/24/2021   ADDENDUM REPORT: 11/24/2021 14:24 EXAM: OVER-READ INTERPRETATION  CT CHEST The following report is an over-read performed by radiologist Dr. Nettie Elm [facility] on 11/24/2021. This over-read does not include interpretation of cardiac or coronary anatomy or pathology. The coronary calcium score/coronary CTA interpretation by the cardiologist is attached. COMPARISON:  High-resolution chest CT dated September 14, 2021. FINDINGS: Vascular: There are no significant  non-cardiac vascular findings. Mediastinum/Nodes: There are no enlarged lymph nodes.The visualized esophagus demonstrates no significant findings. Lungs/Pleura: Clear lungs. No pneumothorax or pleural effusion. Upper abdomen: No acute abnormality. Postsurgical changes at the gastroesophageal junction related to Nissen fundoplication. Musculoskeletal/Chest wall: No chest wall abnormality. No acute or significant osseous findings. IMPRESSION: 1. No significant extracardiac findings. Electronically Signed   By: Titus Dubin M.D.   On: 11/24/2021 14:24   Result Date: 11/24/2021 CLINICAL DATA:  71 yo female with chest pain EXAM: Cardiac/Coronary CTA TECHNIQUE: A non-contrast, gated CT scan was obtained with axial slices of 3 mm through the heart for calcium scoring. Calcium scoring was performed using the Agatston method. A 120 kV prospective, gated, contrast cardiac scan was obtained. Gantry rotation speed was 250 msecs and collimation was 0.6 mm. Two sublingual nitroglycerin tablets (0.8 mg) were given. The 3D data set was reconstructed in 5% intervals of the 35-75% of the R-R cycle. Diastolic phases were analyzed on a dedicated workstation using MPR, MIP, and VRT modes. The patient received 95 cc of contrast. FINDINGS: Image quality: Average. Noise artifact is: Limited). Coronary Arteries:  Normal coronary origin.  Right dominance. Left main: There is Ca in close proximity to the left main in the aorta noted but does not limit flow. The left main is a large caliber vessel with a normal take off from the left coronary cusp that trifurcates into a LAD, LCX, and ramus intermedius. There is no plaque or stenosis. Left anterior descending artery: The LAD has minimal (0-24) calcifed plaque in the proximal and distal vessel; there is mild (25-49) soft plaque in the proximal and mid vessel. The LAD gives off 1 patent diagonal branch. Ramus intermedius: Patent  with no evidence of plaque or stenosis. Left circumflex  artery: The LCX is non-dominant with minimal (0-24) calcified plaque followed by moderate (50-69) soft plaque stenosis. The LCX gives off 1 patent obtuse marginal branch. Right coronary artery: The RCA is dominant with normal take off from the right coronary cusp. There is mild (25-49) mixed plaque stenosis in the mid vessel. The RCA terminates as a PDA and right posterolateral branch without evidence of plaque or stenosis. Right Atrium: Right atrial size is within normal limits. Right Ventricle: The right ventricular cavity is within normal limits. Left Atrium: Left atrial size is normal in size with no left atrial appendage filling defect. Left Ventricle: The ventricular cavity size is within normal limits. There are no stigmata of prior infarction. There is no abnormal filling defect. Pulmonary arteries: Normal in size without proximal filling defect. Pulmonary veins: Normal pulmonary venous drainage. Pericardium: Normal thickness with no significant effusion or calcium present. Cardiac valves: The aortic valve is trileaflet without significant calcification. The mitral valve is normal structure without significant calcification. Aorta: Normal caliber with aortic atherosclerosis. Extra-cardiac findings: See attached radiology report for non-cardiac structures. IMPRESSION: 1. Coronary calcium score of 70. This was 4 percentile for age-, sex, and race-matched controls. 2. Normal coronary origin with right dominance. 3. Moderate (50-69) soft plaque stenosis in the Lcx; otherwise mild disease. 4. Aortic atherosclerosis. 5. Study will be sent for FFR. RECOMMENDATIONS: CAD-RADS 3: Moderate stenosis. Consider symptom-guided anti-ischemic pharmacotherapy as well as risk factor modification per guideline directed care. Additional analysis with CT FFR will be submitted. Kirk Ruths, MD Electronically Signed: By: Kirk Ruths M.D. On: 11/24/2021 13:53   CT CORONARY FRACTIONAL FLOW RESERVE DATA PREP  Result Date:  11/24/2021 EXAM: FFRCT ANALYSIS FINDINGS: FFRct analysis was performed on the original cardiac CT angiogram dataset. Diagrammatic representation of the FFRct analysis is provided in a separate PDF document in PACS. This dictation was created using the PDF document and an interactive 3D model of the results. 3D model is not available in the EMR/PACS. Normal FFR range is >0.80. 1. Left Main: findings 2. LAD: findings 0.91 3. LCX: findings 0.93, 0.87 0.77 4.     RCA: findings 0.86, 0.83 IMPRESSION: FFR suggests mid Lcx lesion is not flow limiting (abnormal only in very distal vessel); medical therapy likely best option. Note: These examples are not recommendations of Heart[employer] and only provided as examples of what other customers are doing. Electronically Signed   By: Kirk Ruths M.D.   On: 11/24/2021 14:00   CT CORONARY FRACTIONAL FLOW RESERVE FLUID ANALYSIS  Result Date: 11/24/2021 EXAM: FFRCT ANALYSIS FINDINGS: FFRct analysis was performed on the original cardiac CT angiogram dataset. Diagrammatic representation of the FFRct analysis is provided in a separate PDF document in PACS. This dictation was created using the PDF document and an interactive 3D model of the results. 3D model is not available in the EMR/PACS. Normal FFR range is >0.80. 1. Left Main: findings 2. LAD: findings 0.91 3. LCX: findings 0.93, 0.87 0.77 4.     RCA: findings 0.86, 0.83 IMPRESSION: FFR suggests mid Lcx lesion is not flow limiting (abnormal only in very distal vessel); medical therapy likely best option. Note: These examples are not recommendations of HeartFlow and only provided as examples of what other customers are doing. Electronically Signed   By: Kirk Ruths M.D.   On: 11/24/2021 14:00    Assessment & Plan:   Problem List Items Addressed This Visit     Abnormal  CT of liver    GI consult w/DrPyrtle  is pending      Bronchiectasis (HCC)    Promethazine with codeine prescription renewed  Potential benefits of  a long term opioids use as well as potential risks (i.e. addiction risk, apnea etc) and complications (i.e. Somnolence, constipation and others) were explained to the patient and were aknowledged. F/u w/Dr Hunsucker      Coronary artery disease    Zetia caused diarrhea: try 1/2 tab a day      Relevant Medications   furosemide (LASIX) 20 MG tablet   HYPERKALEMIA   Relevant Medications   acetaminophen-codeine (TYLENOL #3) 300-30 MG tablet   LOW BACK PAIN    Chronic  Cont on Tylenol #3 prn  Potential benefits of a long term opioids use as well as potential risks (i.e. addiction risk, apnea etc) and complications (i.e. Somnolence, constipation and others) were explained to the patient and were aknowledged.      Relevant Medications   acetaminophen-codeine (TYLENOL #3) 300-30 MG tablet   Vitamin D deficiency   Relevant Medications   acetaminophen-codeine (TYLENOL #3) 300-30 MG tablet   Other Visit Diagnoses     Needs flu shot    -  Primary   Relevant Orders   Flu Vaccine QUAD High Dose(Fluad) (Completed)   URTICARIA       Relevant Medications   acetaminophen-codeine (TYLENOL #3) 300-30 MG tablet   Chronic obstructive airway disease with asthma (HCC)       Relevant Medications   acetaminophen-codeine (TYLENOL #3) 300-30 MG tablet   Vitamin B-complex deficiency       Relevant Medications   acetaminophen-codeine (TYLENOL #3) 300-30 MG tablet         Meds ordered this encounter  Medications   furosemide (LASIX) 20 MG tablet    Sig: TAKE 1 TABLET(20 MG) BY MOUTH DAILY AS NEEDED    Dispense:  90 tablet    Refill:  1   acetaminophen-codeine (TYLENOL #3) 300-30 MG tablet    Sig: Take 1 tablet by mouth every 6 (six) hours as needed.    Dispense:  60 tablet    Refill:  3      Follow-up: Return in about 3 months (around 03/09/2022) for a follow-up visit.  Walker Kehr, MD

## 2021-12-09 ENCOUNTER — Ambulatory Visit: Payer: Medicare Other | Admitting: Psychology

## 2021-12-09 ENCOUNTER — Other Ambulatory Visit: Payer: Self-pay | Admitting: *Deleted

## 2021-12-09 NOTE — Telephone Encounter (Signed)
Pt need her pt assistant for [employer]. Called [place]bie spoke w/ [place] order for [employer]. She states order has been placed. Order # [id number] will arrive within 7-10 days...Johny Chess

## 2021-12-11 ENCOUNTER — Other Ambulatory Visit: Payer: Self-pay | Admitting: Internal Medicine

## 2021-12-14 ENCOUNTER — Telehealth: Payer: Self-pay | Admitting: Internal Medicine

## 2021-12-14 NOTE — Telephone Encounter (Signed)
Notified pt Arman Filter has came in this afternoon #90 pills . Will leave at front for pick-up../lm,b

## 2021-12-14 NOTE — Telephone Encounter (Signed)
Patient wants to know if her vaylor has come in yet?  Please notify her if it has arrived.

## 2021-12-16 NOTE — Telephone Encounter (Signed)
Patient picked up medication

## 2021-12-18 ENCOUNTER — Ambulatory Visit (INDEPENDENT_AMBULATORY_CARE_PROVIDER_SITE_OTHER): Payer: Medicare Other | Admitting: Internal Medicine

## 2021-12-18 ENCOUNTER — Other Ambulatory Visit (INDEPENDENT_AMBULATORY_CARE_PROVIDER_SITE_OTHER): Payer: Medicare Other

## 2021-12-18 ENCOUNTER — Encounter: Payer: Self-pay | Admitting: Internal Medicine

## 2021-12-18 VITALS — BP 124/72 | HR 64 | Ht 62.0 in | Wt 149.0 lb

## 2021-12-18 DIAGNOSIS — I25111 Atherosclerotic heart disease of native coronary artery with angina pectoris with documented spasm: Secondary | ICD-10-CM

## 2021-12-18 DIAGNOSIS — Z800 Family history of malignant neoplasm of digestive organs: Secondary | ICD-10-CM | POA: Diagnosis not present

## 2021-12-18 DIAGNOSIS — K769 Liver disease, unspecified: Secondary | ICD-10-CM | POA: Diagnosis not present

## 2021-12-18 DIAGNOSIS — R932 Abnormal findings on diagnostic imaging of liver and biliary tract: Secondary | ICD-10-CM

## 2021-12-18 DIAGNOSIS — Z86010 Personal history of colonic polyps: Secondary | ICD-10-CM | POA: Diagnosis not present

## 2021-12-18 LAB — BUN: BUN: 8 mg/dL (ref 6–23)

## 2021-12-18 LAB — PROTIME-INR
INR: 1 ratio (ref 0.8–1.0)
Prothrombin Time: 11 s (ref 9.6–13.1)

## 2021-12-18 LAB — CREATININE, SERUM: Creatinine, Ser: 0.63 mg/dL (ref 0.40–1.20)

## 2021-12-18 LAB — FERRITIN: Ferritin: 45.4 ng/mL (ref 10.0–291.0)

## 2021-12-18 NOTE — Progress Notes (Signed)
Subjective:    Patient ID: Veronica [name], female    DOB: Oct 04, 1950, 71 y.o.   MRN: [id number]  HPI Veronica [name] is a 71 year old female with a history of GERD status post Nissen fundoplication, V77 deficiency, history of colon polyps including adenomatous and sessile serrated colon polyps, family history of colon cancer in first-degree relative who is seen to follow-up abnormal liver imaging.  She is here alone today.  She was last seen for upper endoscopy and colonoscopy in January 2021.  Medical history also includes but is not limited to bronchiectasis, dyslipidemia, epilepsy, hypertension, hypothyroidism, Sjogren's syndrome, anxiety and depression.  She reports she is feeling well.  She was concerned because the CT scan she had done of her lungs for bronchiectasis suggested liver cirrhosis.  She reports that her family history includes her sister who had what sounds like metastatic colon cancer to the liver.  She also had 2 uncles and an aunt with liver cancer but in at least 2 of these individuals it sounds metastatic by her report.  She also recalls a brother who had elevated bilirubin.  She denies abdominal pain.  Good appetite.  No jaundice, abdominal swelling, lower extremity edema or bleeding.  No blood in stool or melena.  She denies issues recently with heartburn, dysphagia and odynophagia.  She does not drink alcohol at all.  Review of Systems As per HPI, otherwise negative  Current Medications, Allergies, Past Medical History, Past Surgical History, Family History and Social History were reviewed in [employer] record.    Objective:   Physical Exam BP 124/72   Pulse 64   Ht '5\' 2"'$  (1.575 m) Comment: without shoes  Wt 149 lb (67.6 kg)   BMI 27.25 kg/m  Gen: awake, alert, NAD HEENT: anicteric, op clear CV: RRR, no mrg Pulm: CTA b/l Abd: soft, NT/ND, +BS throughout, no hepatosplenomegaly Ext: no c/c/e, no palmar erythema Neuro: nonfocal, no  asterixis     Latest Ref Rng & Units 06/10/2021    8:02 AM 03/05/2021   11:55 AM 08/26/2020   11:58 AM  CBC  WBC 4.0 - 10.5 K/uL 7.7  10.3  11.8   Hemoglobin 12.0 - 15.0 g/dL 13.2  13.2  14.5   Hematocrit 36.0 - 46.0 % 39.0  39.7  42.8   Platelets 150.0 - 400.0 K/uL 284.0  371.0  345.0    CMP     Component Value Date/Time   NA 142 11/03/2021 1144   K 4.5 11/03/2021 1144   CL 100 11/03/2021 1144   CO2 28 11/03/2021 1144   GLUCOSE 72 11/03/2021 1144   GLUCOSE 122 (H) 06/10/2021 0802   BUN 8 12/18/2021 1034   BUN 10 11/03/2021 1144   CREATININE 0.63 12/18/2021 1034   CREATININE 0.61 12/03/2019 1210   CALCIUM 7.8 (L) 11/03/2021 1144   PROT 6.7 06/10/2021 0802   ALBUMIN 4.1 06/10/2021 0802   AST 13 06/10/2021 0802   ALT 11 06/10/2021 0802   ALKPHOS 116 06/10/2021 0802   BILITOT 0.3 06/10/2021 0802   GFRNONAA 92 12/03/2019 1210   GFRAA 106 12/03/2019 1210   Lab Results  Component Value Date   INR 1.0 12/18/2021   INR 1.0 ratio 11/07/2009     MRI ABDOMEN WITHOUT AND WITH CONTRAST (INCLUDING MRCP)   TECHNIQUE: Multiplanar multisequence MR imaging of the abdomen was performed both before and after the administration of intravenous contrast. Heavily T2-weighted images of the biliary and pancreatic ducts were obtained, and three-dimensional  MRCP images were rendered by post processing.   CONTRAST:  46m GADAVIST GADOBUTROL 1 MMOL/ML IV SOLN   COMPARISON:  Abdominal ultrasound 08/07/2015. Abdominal CT 08/31/2010.   FINDINGS: Despite efforts by the technologist and patient, mild motion artifact is present on today's exam and could not be eliminated. This reduces exam sensitivity and specificity.   Lower chest: There are no significant findings within the visualized lower chest.   Hepatobiliary: Small hepatic cysts are noted. There are new suspicious hepatic findings. The gallbladder is surgically absent. There is mild fusiform extrahepatic biliary dilatation with  the common hepatic duct measuring up to 8 mm in diameter. The duct tapers distally and appears similar to previous CT. No evidence of choledocholithiasis or intrahepatic biliary dilatation.   Pancreas: Unremarkable. No pancreatic ductal dilatation or surrounding inflammatory changes. Stable prominent fat between the pancreatic head and the superior mesenteric vein.   Spleen: Normal in size without focal abnormality.   Adrenals/Urinary Tract: Both adrenal glands appear normal. Small right renal cyst. No evidence of renal mass or hydronephrosis.   Stomach/Bowel: No evidence of bowel wall thickening, distention or surrounding inflammatory change.   Vascular/Lymphatic: There are no enlarged abdominal lymph nodes. Aortic and branch vessel atherosclerosis. No acute vascular findings identified.   Other: The visualized anterior abdominal wall appears intact. No ascites.   Musculoskeletal: No acute or significant osseous findings. Facet hypertrophy and associated synovial enhancement noted within the mid lumbar spine.   IMPRESSION: 1. No acute findings or explanation for the patient's symptoms. 2. Mild fusiform extrahepatic biliary dilatation status post cholecystectomy, similar to previous CT. No evidence of choledocholithiasis or intrahepatic biliary dilatation. 3.  Aortic Atherosclerosis (ICD10-I70.0).     Electronically Signed   By: WRichardean SaleM.D.   On: 02/12/2019 13:07  CT CHEST WITHOUT CONTRAST   TECHNIQUE: Multidetector CT imaging of the chest was performed following the standard protocol without intravenous contrast. High resolution imaging of the lungs, as well as inspiratory and expiratory imaging, was performed.   RADIATION DOSE REDUCTION: This exam was performed according to the departmental dose-optimization program which includes automated exposure control, adjustment of the mA and/or kV according to patient size and/or use of iterative reconstruction  technique.   COMPARISON:  06/28/2016.   FINDINGS: Cardiovascular: Atherosclerotic calcification of the aorta and coronary arteries. Heart size normal. No pericardial effusion.   Mediastinum/Nodes: Thyroidectomy. No pathologically enlarged mediastinal or axillary lymph nodes. Hilar regions are difficult to definitively evaluate without IV contrast. Esophagus is grossly unremarkable.   Lungs/Pleura: Negative for subpleural reticulation, traction bronchiectasis/bronchiolectasis, ground-glass, architectural distortion or honeycombing. Focal mucoid impaction in the apical left upper lobe (10/23). 3 mm peripheral left upper lobe nodule (10/63) and peripheral left lower lobe nodule (10/103), unchanged and benign. 4 mm lingular ground-glass nodule (10/94). No specific follow-up recommended. No air trapping. No pleural fluid. Airway is unremarkable.   Upper Abdomen: Liver margin is slightly irregular. Subcentimeter low-attenuation lesion in the dome of the right hepatic lobe is too small to characterize. Visualized portions of the liver, adrenal glands, kidneys, spleen pancreas are otherwise unremarkable. Postoperative changes of fundoplication. Cholecystectomy.   Musculoskeletal: Degenerative changes in the spine. No worrisome lytic or sclerotic lesions.   IMPRESSION: 1. No evidence of interstitial lung disease. 2. Cirrhosis. 3. Aortic atherosclerosis (ICD10-I70.0). Coronary artery calcification.     Electronically Signed   By: MLorin PicketM.D.   On: 09/16/2021 09:36         Assessment & Plan:  71year old female with a  history of GERD status post Nissen fundoplication, W38 deficiency, history of colon polyps including adenomatous and sessile serrated colon polyps, family history of colon cancer in first-degree relative who is seen to follow-up abnormal liver imaging.   Abnormal CT scan of the liver raising the question of cirrhosis --we discussed her liver imaging today  as well as cirrhosis.  She does not have a specific known driver for liver fibrosis/cirrhosis.  She has some metabolic dysfunction but no previously documented fatty liver.  MRI of the abdomen plus MRCP did not show cirrhosis morphology or concerning features of portal hypertension in 2020.  There is no evidence for portal hypertension or decompensated liver disease.  It should be noted that her INR is normal, her platelets are normal and her albumin is normal.  Her liver enzymes are also normal.  I think that it is quite possible that she has no cirrhosis but we will work-up further as discussed today and as follows: -- Repeat dedicated liver imaging with MRI with and without contrast of the abdomen -- FibroSure blood test -- Viral hepatitis serology, ANA, smooth muscle antibody, mitochondrial antibody, ferritin, ceruloplasmin and alpha 1 antitrypsin -- We will reserve liver biopsy for any abnormal findings above which support a diagnosis of advanced liver fibrosis -- Reassurance provided and I will follow-up with her in clinic  2.  History of colon polyps and family history of colon cancer --she is up-to-date with colonoscopic surveillance, repeat colonoscopy recommended around January 2026  40 minutes total spent today including patient facing time, coordination of care, reviewing medical history/procedures/pertinent radiology studies, and documentation of the encounter.

## 2021-12-18 NOTE — Patient Instructions (Signed)
You have been scheduled for a follow up with Dr Hilarie Fredrickson on 03/09/22 at 3:00 pm  Your provider has requested that you go to the basement level for lab work before leaving today. Press "B" on the elevator. The lab is located at the first door on the left as you exit the elevator.  You have been scheduled for an MRI at [facility] on 12/28/21. Your appointment time is 4:00 pm. Please arrive to admitting (at main entrance of the hospital) 30 minutes prior to your appointment time for registration purposes. Please make certain not to have anything to eat or drink 6 hours prior to your test. In addition, if you have any metal in your body, have a pacemaker or defibrillator, please be sure to let your ordering physician know. This test typically takes 45 minutes to 1 hour to complete. Should you need to reschedule, please call [phone] to do so.  _______________________________________________________  If you are age 68 or older, your body mass index should be between 23-30. Your Body mass index is 27.25 kg/m. If this is out of the aforementioned range listed, please consider follow up with your Primary Care Provider.  If you are age 5 or younger, your body mass index should be between 19-25. Your Body mass index is 27.25 kg/m. If this is out of the aformentioned range listed, please consider follow up with your Primary Care Provider.   ________________________________________________________  The [facility] providers would like to encourage you to use [facility] to communicate with providers for non-urgent requests or questions.  Due to long hold times on the telephone, sending your provider a message by [facility] may be a faster and more efficient way to get a response.  Please allow 48 business hours for a response.  Please remember that this is for non-urgent requests.  _______________________________________________________  Due to recent changes in healthcare laws, you may see the results  of your imaging and laboratory studies on MyChart before your provider has had a chance to review them.  We understand that in some cases there may be results that are confusing or concerning to you. Not all laboratory results come back in the same time frame and the provider may be waiting for multiple results in order to interpret others.  Please give [place] 48 hours in order for your provider to thoroughly review all the results before contacting the office for clarification of your results.

## 2021-12-20 LAB — FIB-4 W/RX NASH FIBROSURE PLUS
ALT: 15 IU/L (ref 0–32)
AST: 21 IU/L (ref 0–40)
FIB-4 Index: 1.06 (ref 0.00–2.67)
Platelets: 367 10*3/uL (ref 150–450)

## 2021-12-21 ENCOUNTER — Ambulatory Visit (INDEPENDENT_AMBULATORY_CARE_PROVIDER_SITE_OTHER): Payer: Medicare Other | Admitting: Psychology

## 2021-12-21 DIAGNOSIS — F331 Major depressive disorder, recurrent, moderate: Secondary | ICD-10-CM | POA: Diagnosis not present

## 2021-12-21 NOTE — Progress Notes (Signed)
[facility] Counselor/Therapist Progress Note  Patient ID: BRINDLE LEYBA, MRN: [id number],    Date: 12/21/2021  Time Spent: 12:00pm - 12:45pm    45 minutes   Treatment Type: Individual Therapy  Reported Symptoms: worrying  Mental Status Exam: Appearance:  Casual     Behavior: Appropriate  Motor: Normal  Speech/Language:  Normal Rate  Affect: Appropriate  Mood: normal  Thought process: normal  Thought content:   WNL  Sensory/Perceptual disturbances:   WNL  Orientation: oriented to person, place, time/date, and situation  Attention: Good  Concentration: Good  Memory: WNL  Fund of knowledge:  Good  Insight:   Good  Judgment:  Good  Impulse Control: Good   Risk Assessment: Danger to Self:  No Self-injurious Behavior: No Danger to Others: No Duty to Warn:no Physical Aggression / Violence:No  Access to Firearms a concern: No  Gang Involvement:No   Subjective: Pt present for individual therapy via  phone.  Pt consents to telehealth session due to [place] 19 pandemic. Location of pt: home Location of therapist: home office.  Pt talked about her health.  She saw the GI doctor last week and he said she has a spot on her liver so pt needs to get an MRI next Monday.   Pt was told she may have to have a liver biopsy.  Addressed pt's worries about her health.   Worked on worry management and thought reframing.   Pt is working on living in the present moment.   Worked on additional coping and self care strategies.   Pt talked about financial stress.  She has had to get her car repaired and the cost was much more than she thought it would be.  Provided supportive therapy.    Interventions: Cognitive Behavioral Therapy and Insight-Oriented  Diagnosis:  F33.1  Plan of Care: Recommend ongoing therapy.   Pt participated in setting therapy goals.  Pt wants to have someone to talk to and improve coping skills.   Plan to continue to meet every two weeks.   Pt is  progressing toward treatment goals.   Treatment Plan (Treatment Plan Target Date: 06/09/2022) Client Abilities/Strengths  Pt is bright, engaging, and motivated for therapy.   Client Treatment Preferences  Individual therapy.  Client Statement of Needs  Improve coping skills.  Symptoms  Depressed or irritable mood. Lack of energy. Feelings of hopelessness, worthlessness, or inappropriate guilt. Low self-esteem. Unresolved grief issues.   Problems Addressed  Unipolar Depression Goals 1. Alleviate depressive symptoms and return to previous level of effective functioning. 2. Appropriately grieve the loss in order to normalize mood and to return to previously adaptive level of functioning. Objective Learn and implement behavioral strategies to overcome depression. Target Date: 2022-06-09 Frequency: Biweekly  Progress: 40 Modality: individual  Related Interventions Engage the client in "behavioral activation," increasing his/her activity level and contact with sources of reward, while identifying processes that inhibit activation.  Use behavioral techniques such as instruction, rehearsal, role-playing, role reversal, as needed, to facilitate activity in the client's daily life; reinforce success. Assist the client in developing skills that increase the likelihood of deriving pleasure from behavioral activation (e.g., assertiveness skills, developing an exercise plan, less internal/more external focus, increased social involvement); reinforce success. Objective Identify important people in life, past and present, and describe the quality, good and poor, of those relationships. Target Date: 2022-06-09 Frequency: Biweekly  Progress: 40 Modality: individual  Related Interventions Conduct Interpersonal Therapy beginning with the assessment  of the client's "interpersonal inventory" of important past and present relationships; develop a case formulation linking depression to grief, interpersonal  role disputes, role transitions, and/or interpersonal deficits). Objective Learn and implement problem-solving and decision-making skills. Target Date: 2022-06-09 Frequency: Biweekly  Progress: 40 Modality: individual  Related Interventions Conduct Problem-Solving Therapy using techniques such as psychoeducation, modeling, and role-playing to teach client problem-solving skills (i.e., defining a problem specifically, generating possible solutions, evaluating the pros and cons of each solution, selecting and implementing a plan of action, evaluating the efficacy of the plan, accepting or revising the plan); role-play application of the problem-solving skill to a real life issue. Encourage in the client the development of a positive problem orientation in which problems and solving them are viewed as a natural part of life and not something to be feared, despaired, or avoided. 3. Develop healthy interpersonal relationships that lead to the alleviation and help prevent the relapse of depression. 4. Develop healthy thinking patterns and beliefs about self, others, and the world that lead to the alleviation and help prevent the relapse of depression. 5. Recognize, accept, and cope with feelings of depression. Diagnosis F33.1  Conditions For Discharge Achievement of treatment goals and objectives   Clint Bolder, LCSW                 Tecla Mailloux Baxter, LCSW

## 2021-12-25 LAB — HEPATITIS B SURFACE ANTIGEN: Hepatitis B Surface Ag: NONREACTIVE

## 2021-12-25 LAB — HEPATITIS B SURFACE ANTIBODY,QUALITATIVE: Hep B S Ab: NONREACTIVE

## 2021-12-25 LAB — ANA: Anti Nuclear Antibody (ANA): NEGATIVE

## 2021-12-25 LAB — HEPATITIS C ANTIBODY: Hepatitis C Ab: NONREACTIVE

## 2021-12-25 LAB — ALPHA-1-ANTITRYPSIN: A-1 Antitrypsin, Ser: 144 mg/dL (ref 83–199)

## 2021-12-25 LAB — ANTI-SMOOTH MUSCLE ANTIBODY, IGG: Actin (Smooth Muscle) Antibody (IGG): <20 U (ref <20)

## 2021-12-25 LAB — MITOCHONDRIAL ANTIBODIES: Mitochondrial M2 Ab, IgG: <=20 U (ref <=20.0)

## 2021-12-25 LAB — HEPATITIS A ANTIBODY, TOTAL: Hepatitis A AB,Total: NONREACTIVE

## 2021-12-28 ENCOUNTER — Ambulatory Visit (HOSPITAL_COMMUNITY)
Admission: RE | Admit: 2021-12-28 | Discharge: 2021-12-28 | Disposition: A | Discharge status: Home / Self Care | Payer: Medicare Other | Source: Ambulatory Visit | Attending: Internal Medicine | Admitting: Internal Medicine

## 2021-12-28 DIAGNOSIS — R932 Abnormal findings on diagnostic imaging of liver and biliary tract: Secondary | ICD-10-CM | POA: Insufficient documentation

## 2021-12-28 MED ORDER — GADOBUTROL 1 MMOL/ML IV SOLN
7.0000 mL | Freq: Once | INTRAVENOUS | Status: AC | PRN
  Administered 2021-12-28: 7 mL via INTRAVENOUS

## 2021-12-30 ENCOUNTER — Telehealth: Payer: Self-pay | Admitting: Internal Medicine

## 2021-12-30 NOTE — Telephone Encounter (Signed)
Inbound call from patient stating she is calling to receive results. Please advise.  Thank you

## 2021-12-30 NOTE — Telephone Encounter (Signed)
See result note.  

## 2022-01-04 ENCOUNTER — Ambulatory Visit (INDEPENDENT_AMBULATORY_CARE_PROVIDER_SITE_OTHER): Payer: Medicare Other | Admitting: Psychology

## 2022-01-04 DIAGNOSIS — F331 Major depressive disorder, recurrent, moderate: Secondary | ICD-10-CM | POA: Diagnosis not present

## 2022-01-04 NOTE — Progress Notes (Signed)
[facility] Counselor/Therapist Progress Note  Patient ID: ALEXIAH KOROMA, MRN: [id number],    Date: 01/04/2022  Time Spent: 12:00pm - 12:45pm    45 minutes   Treatment Type: Individual Therapy  Reported Symptoms: worrying  Mental Status Exam: Appearance:  Casual     Behavior: Appropriate  Motor: Normal  Speech/Language:  Normal Rate  Affect: Appropriate  Mood: normal  Thought process: normal  Thought content:   WNL  Sensory/Perceptual disturbances:   WNL  Orientation: oriented to person, place, time/date, and situation  Attention: Good  Concentration: Good  Memory: WNL  Fund of knowledge:  Good  Insight:   Good  Judgment:  Good  Impulse Control: Good   Risk Assessment: Danger to Self:  No Self-injurious Behavior: No Danger to Others: No Duty to Warn:no Physical Aggression / Violence:No  Access to Firearms a concern: No  Gang Involvement:No   Subjective: Pt present for individual therapy via  phone.  Pt consents to telehealth session due to [place] 19 pandemic. Location of pt: home Location of therapist: home office.  Pt talked about her health.  Pt got her MRI report and it showed that there are several cysts in her liver.   Pt states the medical team can not do anything about them.  Pt does not have hepatitis.  The cysts are benign and not cancer.   Pt is relieved about that news but still has worries.  Addressed pt's worries about her health.   Worked on worry management and thought reframing.   Pt is working on living in the present moment.  Worked on additional coping and self care strategies.   Pt talked about the stress of having her heat go out.   She is working on getting it repaired.  Provided supportive therapy.    Interventions: Cognitive Behavioral Therapy and Insight-Oriented  Diagnosis:  F33.1  Plan of Care: Recommend ongoing therapy.   Pt participated in setting therapy goals.  Pt wants to have someone to talk to and improve  coping skills.   Plan to continue to meet every two weeks.   Pt is progressing toward treatment goals.   Treatment Plan (Treatment Plan Target Date: 06/09/2022) Client Abilities/Strengths  Pt is bright, engaging, and motivated for therapy.   Client Treatment Preferences  Individual therapy.  Client Statement of Needs  Improve coping skills.  Symptoms  Depressed or irritable mood. Lack of energy. Feelings of hopelessness, worthlessness, or inappropriate guilt. Low self-esteem. Unresolved grief issues.   Problems Addressed  Unipolar Depression Goals 1. Alleviate depressive symptoms and return to previous level of effective functioning. 2. Appropriately grieve the loss in order to normalize mood and to return to previously adaptive level of functioning. Objective Learn and implement behavioral strategies to overcome depression. Target Date: 2022-06-09 Frequency: Biweekly  Progress: 40 Modality: individual  Related Interventions Engage the client in "behavioral activation," increasing his/her activity level and contact with sources of reward, while identifying processes that inhibit activation.  Use behavioral techniques such as instruction, rehearsal, role-playing, role reversal, as needed, to facilitate activity in the client's daily life; reinforce success. Assist the client in developing skills that increase the likelihood of deriving pleasure from behavioral activation (e.g., assertiveness skills, developing an exercise plan, less internal/more external focus, increased social involvement); reinforce success. Objective Identify important people in life, past and present, and describe the quality, good and poor, of those relationships. Target Date: 2022-06-09 Frequency: Biweekly  Progress: 40 Modality: individual  Related Interventions Conduct Interpersonal Therapy beginning with the assessment of the client's "interpersonal inventory" of important past and present relationships;  develop a case formulation linking depression to grief, interpersonal role disputes, role transitions, and/or interpersonal deficits). Objective Learn and implement problem-solving and decision-making skills. Target Date: 2022-06-09 Frequency: Biweekly  Progress: 40 Modality: individual  Related Interventions Conduct Problem-Solving Therapy using techniques such as psychoeducation, modeling, and role-playing to teach client problem-solving skills (i.e., defining a problem specifically, generating possible solutions, evaluating the pros and cons of each solution, selecting and implementing a plan of action, evaluating the efficacy of the plan, accepting or revising the plan); role-play application of the problem-solving skill to a real life issue. Encourage in the client the development of a positive problem orientation in which problems and solving them are viewed as a natural part of life and not something to be feared, despaired, or avoided. 3. Develop healthy interpersonal relationships that lead to the alleviation and help prevent the relapse of depression. 4. Develop healthy thinking patterns and beliefs about self, others, and the world that lead to the alleviation and help prevent the relapse of depression. 5. Recognize, accept, and cope with feelings of depression. Diagnosis F33.1  Conditions For Discharge Achievement of treatment goals and objectives   Clint Bolder, LCSW

## 2022-01-14 ENCOUNTER — Telehealth: Payer: Self-pay | Admitting: Internal Medicine

## 2022-01-14 DIAGNOSIS — E538 Deficiency of other specified B group vitamins: Secondary | ICD-10-CM

## 2022-01-14 DIAGNOSIS — E559 Vitamin D deficiency, unspecified: Secondary | ICD-10-CM

## 2022-01-14 DIAGNOSIS — L509 Urticaria, unspecified: Secondary | ICD-10-CM

## 2022-01-14 DIAGNOSIS — E875 Hyperkalemia: Secondary | ICD-10-CM

## 2022-01-14 MED ORDER — CYANOCOBALAMIN 1000 MCG/ML IJ SOLN: INTRAMUSCULAR | 5 refills | Status: DC

## 2022-01-14 MED ORDER — "BD INSULIN SYRINGE 25G X 1"" 1 ML MISC": 2 refills | Status: DC

## 2022-01-14 NOTE — Telephone Encounter (Signed)
Patient needs her b12 injectionable has not been sent in.  Please send to [facility] on [address] in [place].  (Cyanocobalmin)  Last visit:  12/08/2021

## 2022-01-14 NOTE — Telephone Encounter (Signed)
Sent refills to [facility],,/[facility]

## 2022-01-18 ENCOUNTER — Other Ambulatory Visit: Payer: Self-pay | Admitting: Internal Medicine

## 2022-01-18 ENCOUNTER — Ambulatory Visit (INDEPENDENT_AMBULATORY_CARE_PROVIDER_SITE_OTHER): Payer: Medicare Other | Admitting: Psychology

## 2022-01-18 DIAGNOSIS — F331 Major depressive disorder, recurrent, moderate: Secondary | ICD-10-CM

## 2022-01-18 NOTE — Progress Notes (Signed)
[facility] Counselor/Therapist Progress Note  Patient ID: Veronica [name], MRN: [id number],    Date: 01/18/2022  Time Spent: 12:00pm - 12:45pm    45 minutes   Treatment Type: Individual Therapy  Reported Symptoms: worrying  Mental Status Exam: Appearance:  Casual     Behavior: Appropriate  Motor: Normal  Speech/Language:  Normal Rate  Affect: Appropriate  Mood: normal  Thought process: normal  Thought content:   WNL  Sensory/Perceptual disturbances:   WNL  Orientation: oriented to person, place, time/date, and situation  Attention: Good  Concentration: Good  Memory: WNL  Fund of knowledge:  Good  Insight:   Good  Judgment:  Good  Impulse Control: Good   Risk Assessment: Danger to Self:  No Self-injurious Behavior: No Danger to Others: No Duty to Warn:no Physical Aggression / Violence:No  Access to Firearms a concern: No  Gang Involvement:No   Subjective: Pt present for individual therapy via  phone.  Pt consents to telehealth session due to [place] 19 pandemic. Location of pt: home Location of therapist: home office.  Pt talked about not having heat in her house for 2 and 1/2 weeks.   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   Pt talked about her health.   Addressed pt's worries about her health.   Worked on worry management and thought reframing.   Pt talked about feeling down at times bc of this time of year when it gets dark so early and with the holidays coming up. Helped pt process her feelings.   Worked on additional coping and self care strategies.   Provided supportive therapy.    Interventions: Cognitive Behavioral Therapy and Insight-Oriented  Diagnosis:  F33.1  Plan of Care: Recommend ongoing therapy.   Pt participated in setting therapy goals.  Pt wants to have someone to talk to and improve coping skills.   Plan to continue to meet every two weeks.   Pt is progressing toward treatment goals.   Treatment Plan  (Treatment Plan Target Date: 06/09/2022) Client Abilities/Strengths  Pt is bright, engaging, and motivated for therapy.   Client Treatment Preferences  Individual therapy.  Client Statement of Needs  Improve coping skills.  Symptoms  Depressed or irritable mood. Lack of energy. Feelings of hopelessness, worthlessness, or inappropriate guilt. Low self-esteem. Unresolved grief issues.   Problems Addressed  Unipolar Depression Goals 1. Alleviate depressive symptoms and return to previous level of effective functioning. 2. Appropriately grieve the loss in order to normalize mood and to return to previously adaptive level of functioning. Objective Learn and implement behavioral strategies to overcome depression. Target Date: 2022-06-09 Frequency: Biweekly  Progress: 40 Modality: individual  Related Interventions Engage the client in "behavioral activation," increasing his/her activity level and contact with sources of reward, while identifying processes that inhibit activation.  Use behavioral techniques such as instruction, rehearsal, role-playing, role reversal, as needed, to facilitate activity in the client's daily life; reinforce success. Assist the client in developing skills that increase the likelihood of deriving pleasure from behavioral activation (e.g., assertiveness skills, developing an exercise plan, less internal/more external focus, increased social involvement); reinforce success. Objective Identify important people in life, past and present, and describe the quality, good and poor, of those relationships. Target Date: 2022-06-09 Frequency: Biweekly  Progress: 40 Modality: individual  Related Interventions Conduct Interpersonal Therapy beginning with the assessment of the client's "interpersonal inventory" of important past and present relationships; develop a case formulation linking  depression to grief, interpersonal role disputes, role transitions, and/or interpersonal  deficits). Objective Learn and implement problem-solving and decision-making skills. Target Date: 2022-06-09 Frequency: Biweekly  Progress: 40 Modality: individual  Related Interventions Conduct Problem-Solving Therapy using techniques such as psychoeducation, modeling, and role-playing to teach client problem-solving skills (i.e., defining a problem specifically, generating possible solutions, evaluating the pros and cons of each solution, selecting and implementing a plan of action, evaluating the efficacy of the plan, accepting or revising the plan); role-play application of the problem-solving skill to a real life issue. Encourage in the client the development of a positive problem orientation in which problems and solving them are viewed as a natural part of life and not something to be feared, despaired, or avoided. 3. Develop healthy interpersonal relationships that lead to the alleviation and help prevent the relapse of depression. 4. Develop healthy thinking patterns and beliefs about self, others, and the world that lead to the alleviation and help prevent the relapse of depression. 5. Recognize, accept, and cope with feelings of depression. Diagnosis F33.1  Conditions For Discharge Achievement of treatment goals and objectives   Clint Bolder, LCSW                Veronica [name] [place], LCSW

## 2022-02-01 ENCOUNTER — Ambulatory Visit (INDEPENDENT_AMBULATORY_CARE_PROVIDER_SITE_OTHER): Payer: Medicare Other | Admitting: Psychology

## 2022-02-01 DIAGNOSIS — F331 Major depressive disorder, recurrent, moderate: Secondary | ICD-10-CM | POA: Diagnosis not present

## 2022-02-01 NOTE — Progress Notes (Signed)
[facility] Counselor/Therapist Progress Note  Patient ID: MULAN ADAN, MRN: [id number],    Date: 02/01/2022  Time Spent: 12:00pm - 12:45pm    45 minutes   Treatment Type: Individual Therapy  Reported Symptoms: worrying  Mental Status Exam: Appearance:  Casual     Behavior: Appropriate  Motor: Normal  Speech/Language:  Normal Rate  Affect: Appropriate  Mood: normal  Thought process: normal  Thought content:   WNL  Sensory/Perceptual disturbances:   WNL  Orientation: oriented to person, place, time/date, and situation  Attention: Good  Concentration: Good  Memory: WNL  Fund of knowledge:  Good  Insight:   Good  Judgment:  Good  Impulse Control: Good   Risk Assessment: Danger to Self:  No Self-injurious Behavior: No Danger to Others: No Duty to Warn:no Physical Aggression / Violence:No  Access to Firearms a concern: No  Gang Involvement:No   Subjective: Pt present for individual therapy via  phone.  Pt consents to telehealth session due to [place] 19 pandemic. Location of pt: home Location of therapist: home office.  Pt talked about the holidays.  She has been working on getting her Christmas shopping done.   Pt does not currently have plans for Thanksgiving which she is down about.  Addressed the issues that are going on in her family.  Her brother's wife is having health issues so she is not doing Thanksgiving.  Helped pt process her feelings and family dynamics.  Addressed how pt can cope with loneliness if she is alone during the holidays. Worked on additional coping and self care strategies.   Provided supportive therapy.    Interventions: Cognitive Behavioral Therapy and Insight-Oriented  Diagnosis:  F33.1  Plan of Care: Recommend ongoing therapy.   Pt participated in setting therapy goals.  Pt wants to have someone to talk to and improve coping skills.   Plan to continue to meet every two weeks.   Pt is progressing toward treatment  goals.   Treatment Plan (Treatment Plan Target Date: 06/09/2022) Client Abilities/Strengths  Pt is bright, engaging, and motivated for therapy.   Client Treatment Preferences  Individual therapy.  Client Statement of Needs  Improve coping skills.  Symptoms  Depressed or irritable mood. Lack of energy. Feelings of hopelessness, worthlessness, or inappropriate guilt. Low self-esteem. Unresolved grief issues.   Problems Addressed  Unipolar Depression Goals 1. Alleviate depressive symptoms and return to previous level of effective functioning. 2. Appropriately grieve the loss in order to normalize mood and to return to previously adaptive level of functioning. Objective Learn and implement behavioral strategies to overcome depression. Target Date: 2022-06-09 Frequency: Biweekly  Progress: 40 Modality: individual  Related Interventions Engage the client in "behavioral activation," increasing his/her activity level and contact with sources of reward, while identifying processes that inhibit activation.  Use behavioral techniques such as instruction, rehearsal, role-playing, role reversal, as needed, to facilitate activity in the client's daily life; reinforce success. Assist the client in developing skills that increase the likelihood of deriving pleasure from behavioral activation (e.g., assertiveness skills, developing an exercise plan, less internal/more external focus, increased social involvement); reinforce success. Objective Identify important people in life, past and present, and describe the quality, good and poor, of those relationships. Target Date: 2022-06-09 Frequency: Biweekly  Progress: 40 Modality: individual  Related Interventions Conduct Interpersonal Therapy beginning with the assessment of the client's "interpersonal inventory" of important past and present relationships; develop a case formulation linking depression to grief, interpersonal role  disputes, role  transitions, and/or interpersonal deficits). Objective Learn and implement problem-solving and decision-making skills. Target Date: 2022-06-09 Frequency: Biweekly  Progress: 40 Modality: individual  Related Interventions Conduct Problem-Solving Therapy using techniques such as psychoeducation, modeling, and role-playing to teach client problem-solving skills (i.e., defining a problem specifically, generating possible solutions, evaluating the pros and cons of each solution, selecting and implementing a plan of action, evaluating the efficacy of the plan, accepting or revising the plan); role-play application of the problem-solving skill to a real life issue. Encourage in the client the development of a positive problem orientation in which problems and solving them are viewed as a natural part of life and not something to be feared, despaired, or avoided. 3. Develop healthy interpersonal relationships that lead to the alleviation and help prevent the relapse of depression. 4. Develop healthy thinking patterns and beliefs about self, others, and the world that lead to the alleviation and help prevent the relapse of depression. 5. Recognize, accept, and cope with feelings of depression. Diagnosis F33.1  Conditions For Discharge Achievement of treatment goals and objectives   Clint Bolder, LCSW

## 2022-02-02 ENCOUNTER — Other Ambulatory Visit: Payer: Self-pay | Admitting: Internal Medicine

## 2022-02-15 ENCOUNTER — Ambulatory Visit (INDEPENDENT_AMBULATORY_CARE_PROVIDER_SITE_OTHER): Payer: Medicare Other | Admitting: Psychology

## 2022-02-15 DIAGNOSIS — F331 Major depressive disorder, recurrent, moderate: Secondary | ICD-10-CM

## 2022-02-15 NOTE — Progress Notes (Signed)
[facility] Counselor/Therapist Progress Note  Patient ID: Veronica [name], MRN: [id number],    Date: 02/15/2022  Time Spent: 12:00pm - 12:45pm    45 minutes   Treatment Type: Individual Therapy  Reported Symptoms: worrying  Mental Status Exam: Appearance:  Casual     Behavior: Appropriate  Motor: Normal  Speech/Language:  Normal Rate  Affect: Appropriate  Mood: normal  Thought process: normal  Thought content:   WNL  Sensory/Perceptual disturbances:   WNL  Orientation: oriented to person, place, time/date, and situation  Attention: Good  Concentration: Good  Memory: WNL  Fund of knowledge:  Good  Insight:   Good  Judgment:  Good  Impulse Control: Good   Risk Assessment: Danger to Self:  No Self-injurious Behavior: No Danger to Others: No Duty to Warn:no Physical Aggression / Violence:No  Access to Firearms a concern: No  Gang Involvement:No   Subjective: Pt present for individual therapy via  phone.  Pt consents to telehealth session due to [place] 19 pandemic. Location of pt: home Location of therapist: home office.  Pt talked about Thanksgiving.  Pt was able to go to her niece's house for Thanksgiving.  She rode there with her brother.  She was glad that she did not have to be alone on Thanksgiving. Helped pt process her feelings and family dynamics.  Pt talked about it being a "depressing time of year".   She feels so alone.  She is having a hard time feeling motivated. Addressed how pt can cope with loneliness if she is alone during the holidays. Worked on additional coping and self care strategies.   Provided supportive therapy.    Interventions: Cognitive Behavioral Therapy and Insight-Oriented  Diagnosis:  F33.1  Plan of Care: Recommend ongoing therapy.   Pt participated in setting therapy goals.  Pt wants to have someone to talk to and improve coping skills.   Plan to continue to meet every two weeks.   Pt is progressing toward  treatment goals.   Treatment Plan (Treatment Plan Target Date: 06/09/2022) Client Abilities/Strengths  Pt is bright, engaging, and motivated for therapy.   Client Treatment Preferences  Individual therapy.  Client Statement of Needs  Improve coping skills.  Symptoms  Depressed or irritable mood. Lack of energy. Feelings of hopelessness, worthlessness, or inappropriate guilt. Low self-esteem. Unresolved grief issues.   Problems Addressed  Unipolar Depression Goals 1. Alleviate depressive symptoms and return to previous level of effective functioning. 2. Appropriately grieve the loss in order to normalize mood and to return to previously adaptive level of functioning. Objective Learn and implement behavioral strategies to overcome depression. Target Date: 2022-06-09 Frequency: Biweekly  Progress: 40 Modality: individual  Related Interventions Engage the client in "behavioral activation," increasing his/her activity level and contact with sources of reward, while identifying processes that inhibit activation.  Use behavioral techniques such as instruction, rehearsal, role-playing, role reversal, as needed, to facilitate activity in the client's daily life; reinforce success. Assist the client in developing skills that increase the likelihood of deriving pleasure from behavioral activation (e.g., assertiveness skills, developing an exercise plan, less internal/more external focus, increased social involvement); reinforce success. Objective Identify important people in life, past and present, and describe the quality, good and poor, of those relationships. Target Date: 2022-06-09 Frequency: Biweekly  Progress: 40 Modality: individual  Related Interventions Conduct Interpersonal Therapy beginning with the assessment of the client's "interpersonal inventory" of important past and present relationships; develop a case formulation linking  depression to grief, interpersonal role disputes, role  transitions, and/or interpersonal deficits). Objective Learn and implement problem-solving and decision-making skills. Target Date: 2022-06-09 Frequency: Biweekly  Progress: 40 Modality: individual  Related Interventions Conduct Problem-Solving Therapy using techniques such as psychoeducation, modeling, and role-playing to teach client problem-solving skills (i.e., defining a problem specifically, generating possible solutions, evaluating the pros and cons of each solution, selecting and implementing a plan of action, evaluating the efficacy of the plan, accepting or revising the plan); role-play application of the problem-solving skill to a real life issue. Encourage in the client the development of a positive problem orientation in which problems and solving them are viewed as a natural part of life and not something to be feared, despaired, or avoided. 3. Develop healthy interpersonal relationships that lead to the alleviation and help prevent the relapse of depression. 4. Develop healthy thinking patterns and beliefs about self, others, and the world that lead to the alleviation and help prevent the relapse of depression. 5. Recognize, accept, and cope with feelings of depression. Diagnosis F33.1  Conditions For Discharge Achievement of treatment goals and objectives   Clint Bolder, LCSW

## 2022-02-17 ENCOUNTER — Encounter: Payer: Self-pay | Admitting: Pulmonary Disease

## 2022-02-17 ENCOUNTER — Ambulatory Visit (INDEPENDENT_AMBULATORY_CARE_PROVIDER_SITE_OTHER): Payer: Medicare Other | Admitting: Pulmonary Disease

## 2022-02-17 VITALS — BP 120/68 | HR 71 | Wt 153.8 lb

## 2022-02-17 DIAGNOSIS — J471 Bronchiectasis with (acute) exacerbation: Secondary | ICD-10-CM | POA: Diagnosis not present

## 2022-02-17 DIAGNOSIS — J4541 Moderate persistent asthma with (acute) exacerbation: Secondary | ICD-10-CM | POA: Diagnosis not present

## 2022-02-17 MED ORDER — METHYLPREDNISOLONE ACETATE 80 MG/ML IJ SUSP
80.0000 mg | Freq: Once | INTRAMUSCULAR | Status: AC
  Administered 2022-02-17: 80 mg via INTRAMUSCULAR

## 2022-02-17 MED ORDER — LEVOFLOXACIN 500 MG PO TABS: 500.0000 mg | ORAL_TABLET | Freq: Every day | ORAL | 0 refills | Status: DC
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
## 2022-02-17 NOTE — Patient Instructions (Signed)
Nice to see you again  We will give you a steroid shot today   I sent an antibiotic,  levofloxacin, take 1 pill once a day for 10 days  I hope you will start feeling better next week, if not please call the office and we will make further recommendations or see you again.  Return to clinic in 3 months or sooner as needed

## 2022-02-20 ENCOUNTER — Other Ambulatory Visit: Payer: Self-pay | Admitting: Internal Medicine

## 2022-02-22 ENCOUNTER — Encounter: Payer: Self-pay | Admitting: *Deleted

## 2022-03-01 ENCOUNTER — Ambulatory Visit (INDEPENDENT_AMBULATORY_CARE_PROVIDER_SITE_OTHER): Payer: Medicare Other | Admitting: Psychology

## 2022-03-01 DIAGNOSIS — F331 Major depressive disorder, recurrent, moderate: Secondary | ICD-10-CM

## 2022-03-01 NOTE — Progress Notes (Signed)
[place] Counselor/Therapist Progress Note  Patient ID: LEEZA HEINER, MRN: [id number],    Date: 03/01/2022  Time Spent: 12:00pm - 12:45pm    45 minutes   Treatment Type: Individual Therapy  Reported Symptoms: worrying  Mental Status Exam: Appearance:  Casual     Behavior: Appropriate  Motor: Normal  Speech/Language:  Normal Rate  Affect: Appropriate  Mood: normal  Thought process: normal  Thought content:   WNL  Sensory/Perceptual disturbances:   WNL  Orientation: oriented to person, place, time/date, and situation  Attention: Good  Concentration: Good  Memory: WNL  Fund of knowledge:  Good  Insight:   Good  Judgment:  Good  Impulse Control: Good   Risk Assessment: Danger to Self:  No Self-injurious Behavior: No Danger to Others: No Duty to Warn:no Physical Aggression / Violence:No  Access to Firearms a concern: No  Gang Involvement:No   Subjective: Pt present for individual therapy via  phone.  Pt consents to telehealth session due to [place] 19 pandemic. Location of pt: home Location of therapist: home office.  Pt talked about being sick the past couple of weeks.   She was put on steroids and antibiotics and the steroids have made her more depressed and anxious.   Pt talked about it being a "depressing time of year".   She feels so alone.   Addressed how pt can cope with loneliness if she is alone during the holidays. Worked on additional coping and self care strategies.   Provided supportive therapy.    Interventions: Cognitive Behavioral Therapy and Insight-Oriented  Diagnosis:  F33.1  Plan of Care: Recommend ongoing therapy.   Pt participated in setting therapy goals.  Pt wants to have someone to talk to and improve coping skills.   Plan to continue to meet every two weeks.   Pt is progressing toward treatment goals.   Treatment Plan (Treatment Plan Target Date: 06/09/2022) Client Abilities/Strengths  Pt is bright, engaging,  and motivated for therapy.   Client Treatment Preferences  Individual therapy.  Client Statement of Needs  Improve coping skills.  Symptoms  Depressed or irritable mood. Lack of energy. Feelings of hopelessness, worthlessness, or inappropriate guilt. Low self-esteem. Unresolved grief issues.   Problems Addressed  Unipolar Depression Goals 1. Alleviate depressive symptoms and return to previous level of effective functioning. 2. Appropriately grieve the loss in order to normalize mood and to return to previously adaptive level of functioning. Objective Learn and implement behavioral strategies to overcome depression. Target Date: 2022-06-09 Frequency: Biweekly  Progress: 40 Modality: individual  Related Interventions Engage the client in "behavioral activation," increasing his/her activity level and contact with sources of reward, while identifying processes that inhibit activation.  Use behavioral techniques such as instruction, rehearsal, role-playing, role reversal, as needed, to facilitate activity in the client's daily life; reinforce success. Assist the client in developing skills that increase the likelihood of deriving pleasure from behavioral activation (e.g., assertiveness skills, developing an exercise plan, less internal/more external focus, increased social involvement); reinforce success. Objective Identify important people in life, past and present, and describe the quality, good and poor, of those relationships. Target Date: 2022-06-09 Frequency: Biweekly  Progress: 40 Modality: individual  Related Interventions Conduct Interpersonal Therapy beginning with the assessment of the client's "interpersonal inventory" of important past and present relationships; develop a case formulation linking depression to grief, interpersonal role disputes, role transitions, and/or interpersonal deficits). Objective Learn and implement problem-solving and decision-making skills. Target  Date:  2022-06-09 Frequency: Biweekly  Progress: 40 Modality: individual  Related Interventions Conduct Problem-Solving Therapy using techniques such as psychoeducation, modeling, and role-playing to teach client problem-solving skills (i.e., defining a problem specifically, generating possible solutions, evaluating the pros and cons of each solution, selecting and implementing a plan of action, evaluating the efficacy of the plan, accepting or revising the plan); role-play application of the problem-solving skill to a real life issue. Encourage in the client the development of a positive problem orientation in which problems and solving them are viewed as a natural part of life and not something to be feared, despaired, or avoided. 3. Develop healthy interpersonal relationships that lead to the alleviation and help prevent the relapse of depression. 4. Develop healthy thinking patterns and beliefs about self, others, and the world that lead to the alleviation and help prevent the relapse of depression. 5. Recognize, accept, and cope with feelings of depression. Diagnosis F33.1  Conditions For Discharge Achievement of treatment goals and objectives   Clint Bolder, LCSW

## 2022-03-02 ENCOUNTER — Ambulatory Visit: Payer: Medicare Other | Attending: Cardiovascular Disease | Admitting: Cardiovascular Disease

## 2022-03-02 ENCOUNTER — Encounter: Payer: Self-pay | Admitting: Cardiovascular Disease

## 2022-03-02 VITALS — BP 126/70 | HR 84 | Ht 60.0 in | Wt 145.6 lb

## 2022-03-02 DIAGNOSIS — B370 Candidal stomatitis: Secondary | ICD-10-CM

## 2022-03-02 DIAGNOSIS — I10 Essential (primary) hypertension: Secondary | ICD-10-CM | POA: Diagnosis not present

## 2022-03-02 DIAGNOSIS — I25111 Atherosclerotic heart disease of native coronary artery with angina pectoris with documented spasm: Secondary | ICD-10-CM | POA: Diagnosis not present

## 2022-03-02 DIAGNOSIS — E7800 Pure hypercholesterolemia, unspecified: Secondary | ICD-10-CM | POA: Diagnosis not present

## 2022-03-02 DIAGNOSIS — I700 Atherosclerosis of aorta: Secondary | ICD-10-CM | POA: Diagnosis not present

## 2022-03-02 MED ORDER — NYSTATIN 100000 UNIT/ML MT SUSP: 5.0000 mL | Freq: Four times a day (QID) | OROMUCOSAL | 0 refills | Status: AC
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
## 2022-03-02 NOTE — Patient Instructions (Signed)
Medication Instructions:  Take Nystatin as instructed  *If you need a refill on your cardiac medications before your next appointment, please call your pharmacy*   Follow-Up: At [facility], you and your health needs are our priority.  As part of our continuing mission to provide you with exceptional heart care, we have created designated Provider Care Teams.  These Care Teams include your primary Cardiologist (physician) and Advanced Practice Providers (APPs -  Physician Assistants and Nurse Practitioners) who all work together to provide you with the care you need, when you need it.  We recommend signing up for the patient portal called "MyChart".  Sign up information is provided on this After Visit Summary.  MyChart is used to connect with patients for Virtual Visits (Telemedicine).  Patients are able to view lab/test results, encounter notes, upcoming appointments, etc.  Non-urgent messages can be sent to your provider as well.   To learn more about what you can do with MyChart, go to [email or web address].    Your next appointment:   12 month(s)  The format for your next appointment:   In Person  Provider:   Sanda Klein, MD

## 2022-03-04 ENCOUNTER — Telehealth: Payer: Self-pay | Admitting: *Deleted

## 2022-03-04 ENCOUNTER — Encounter: Payer: Self-pay | Admitting: Internal Medicine

## 2022-03-04 ENCOUNTER — Ambulatory Visit (INDEPENDENT_AMBULATORY_CARE_PROVIDER_SITE_OTHER): Payer: Medicare Other

## 2022-03-04 ENCOUNTER — Ambulatory Visit (INDEPENDENT_AMBULATORY_CARE_PROVIDER_SITE_OTHER): Payer: Medicare Other | Admitting: Internal Medicine

## 2022-03-04 VITALS — BP 124/72 | HR 75 | Temp 98.4°F | Ht 60.0 in | Wt 142.0 lb

## 2022-03-04 DIAGNOSIS — R110 Nausea: Secondary | ICD-10-CM | POA: Diagnosis not present

## 2022-03-04 DIAGNOSIS — E559 Vitamin D deficiency, unspecified: Secondary | ICD-10-CM

## 2022-03-04 DIAGNOSIS — R197 Diarrhea, unspecified: Secondary | ICD-10-CM

## 2022-03-04 DIAGNOSIS — E875 Hyperkalemia: Secondary | ICD-10-CM

## 2022-03-04 DIAGNOSIS — I25111 Atherosclerotic heart disease of native coronary artery with angina pectoris with documented spasm: Secondary | ICD-10-CM | POA: Diagnosis not present

## 2022-03-04 DIAGNOSIS — B370 Candidal stomatitis: Secondary | ICD-10-CM | POA: Diagnosis not present

## 2022-03-04 DIAGNOSIS — L509 Urticaria, unspecified: Secondary | ICD-10-CM

## 2022-03-04 DIAGNOSIS — E538 Deficiency of other specified B group vitamins: Secondary | ICD-10-CM

## 2022-03-04 DIAGNOSIS — J439 Emphysema, unspecified: Secondary | ICD-10-CM | POA: Diagnosis not present

## 2022-03-04 LAB — COMPREHENSIVE METABOLIC PANEL
ALT: 13 U/L (ref 0–35)
AST: 24 U/L (ref 0–37)
Albumin: 4.3 g/dL (ref 3.5–5.2)
Alkaline Phosphatase: 113 U/L (ref 39–117)
BUN: 8 mg/dL (ref 6–23)
CO2: 33 mEq/L — ABNORMAL HIGH (ref 19–32)
Calcium: 6.6 mg/dL — ABNORMAL LOW (ref 8.4–10.5)
Chloride: 97 mEq/L (ref 96–112)
Creatinine, Ser: 0.64 mg/dL (ref 0.40–1.20)
GFR: 87.96 mL/min (ref >60.00)
Glucose, Bld: 87 mg/dL (ref 70–99)
Potassium: 3.4 mEq/L — ABNORMAL LOW (ref 3.5–5.1)
Sodium: 141 mEq/L (ref 135–145)
Total Bilirubin: 0.4 mg/dL (ref 0.2–1.2)
Total Protein: 7.7 g/dL (ref 6.0–8.3)

## 2022-03-04 LAB — CBC WITH DIFFERENTIAL/PLATELET
Basophils Absolute: 0.1 10*3/uL (ref 0.0–0.1)
Basophils Relative: 0.8 % (ref 0.0–3.0)
Eosinophils Absolute: 0.1 10*3/uL (ref 0.0–0.7)
Eosinophils Relative: 0.9 % (ref 0.0–5.0)
HCT: 42.7 % (ref 36.0–46.0)
Hemoglobin: 14.3 g/dL (ref 12.0–15.0)
Lymphocytes Relative: 21 % (ref 12.0–46.0)
Lymphs Abs: 2.3 10*3/uL (ref 0.7–4.0)
MCHC: 33.6 g/dL (ref 30.0–36.0)
MCV: 86.3 fl (ref 78.0–100.0)
Monocytes Absolute: 1.4 10*3/uL — ABNORMAL HIGH (ref 0.1–1.0)
Monocytes Relative: 12.8 % — ABNORMAL HIGH (ref 3.0–12.0)
Neutro Abs: 7.1 10*3/uL (ref 1.4–7.7)
Neutrophils Relative %: 64.5 % (ref 43.0–77.0)
Platelets: 396 10*3/uL (ref 150.0–400.0)
RBC: 4.94 Mil/uL (ref 3.87–5.11)
RDW: 14.2 % (ref 11.5–15.5)
WBC: 11 10*3/uL — ABNORMAL HIGH (ref 4.0–10.5)

## 2022-03-04 LAB — MAGNESIUM: Magnesium: 1.9 mg/dL (ref 1.5–2.5)

## 2022-03-04 LAB — POC COVID19 BINAXNOW: SARS Coronavirus 2 Ag: NEGATIVE

## 2022-03-04 MED ORDER — ESCITALOPRAM OXALATE 20 MG PO TABS: ORAL_TABLET | ORAL | 3 refills | Status: DC

## 2022-03-04 MED ORDER — TRAZODONE HCL 50 MG PO TABS: ORAL_TABLET | ORAL | 3 refills | Status: DC

## 2022-03-04 MED ORDER — KETOCONAZOLE 2 % EX CREA: 1.0000 | TOPICAL_CREAM | Freq: Every day | CUTANEOUS | 1 refills | Status: AC

## 2022-03-04 MED ORDER — ONDANSETRON HCL 4 MG/2ML IJ SOLN
4.0000 mg | Freq: Once | INTRAMUSCULAR | Status: AC
  Administered 2022-03-04: 4 mg via INTRAMUSCULAR

## 2022-03-04 MED ORDER — METHYLPREDNISOLONE ACETATE 80 MG/ML IJ SUSP
80.0000 mg | Freq: Once | INTRAMUSCULAR | Status: AC
  Administered 2022-03-04: 80 mg via INTRAMUSCULAR

## 2022-03-04 MED ORDER — PROMETHAZINE-DM 6.25-15 MG/5ML PO SYRP: 5.0000 mL | ORAL_SOLUTION | Freq: Four times a day (QID) | ORAL | 1 refills | Status: DC | PRN

## 2022-03-04 MED ORDER — DIPHENOXYLATE-ATROPINE 2.5-0.025 MG PO TABS: 1.0000 | ORAL_TABLET | Freq: Four times a day (QID) | ORAL | 2 refills | Status: AC | PRN

## 2022-03-04 MED ORDER — CYANOCOBALAMIN 1000 MCG/ML IJ SOLN: INTRAMUSCULAR | 5 refills | Status: DC

## 2022-03-04 MED ORDER — ONDANSETRON HCL 4 MG PO TABS: 4.0000 mg | ORAL_TABLET | Freq: Three times a day (TID) | ORAL | 2 refills | Status: AC | PRN

## 2022-03-04 NOTE — Telephone Encounter (Signed)
Pt came in today to see MD.. She is needing her application renewed for her Vraylar. Called [employer] sp[employer] w/ rep will fax over application.Marland KitchenJohny Chess

## 2022-03-04 NOTE — Progress Notes (Signed)
Subjective:  Patient ID: Veronica [name], female    DOB: Apr 11, 1950  Age: 71 y.o. MRN: [id number]  CC: Follow-up (Been taking antibiotics and had steroid shot  still isn't feeling well , having constant diaherra , thrust , sweating and weight loss , has broken out on chest )   HPI Veronica [name] presents for being sick C/o chills, fever, pain, diarrhea x 2-3 wks Got better, then worse  Outpatient Medications Prior to Visit  Medication Sig Dispense Refill   albuterol (PROVENTIL) (2.5 MG/3ML) 0.083% nebulizer solution USE 1 VIAL IN NEBULIZER 2 TIMES DAILY. Generic: VENTOLIN 180 mL 3   albuterol (VENTOLIN HFA) 108 (90 Base) MCG/ACT inhaler Inhale 2 puffs into the lungs every 4 (four) hours as needed for wheezing or shortness of breath. 18 g 11   aspirin 81 MG chewable tablet Chew by mouth daily.     atorvastatin (LIPITOR) 40 MG tablet TAKE 1 TABLET BY MOUTH EVERY DAY 90 tablet 1   budesonide (PULMICORT) 0.5 MG/2ML nebulizer solution Take 2 mLs (0.5 mg total) by nebulization in the morning and at bedtime. 120 mL 11   calcium carbonate (OSCAL) 1500 (600 Ca) MG TABS tablet Take 600 mg of elemental calcium by mouth 2 (two) times daily with a meal.      cariprazine (VRAYLAR) 1.5 MG capsule Take 1 capsule (1.5 mg total) by mouth daily. NDC#- [id number] Exp 10/2023 Lot# [id number] 7 capsule 4   chlorhexidine (PERIDEX) 0.12 % solution Use to wash mouth twice a day     Cholecalciferol (VITAMIN D3) 50 MCG (2000 UT) capsule Take 1 capsule (2,000 Units total) by mouth daily. 100 capsule 3   clonazePAM (KLONOPIN) 1 MG tablet TAKE 2 TABLETS BY MOUTH EVERY NIGHT AT BEDTIME AND 1/2 TABLET EXTRA IN DAYTIME AS NEEDED 225 tablet 1   Docusate Calcium (STOOL SOFTENER PO) Take 100 mg by mouth as needed (for constipation).     ezetimibe (ZETIA) 10 MG tablet Take 1 tablet (10 mg total) by mouth daily. 90 tablet 3   furosemide (LASIX) 20 MG tablet TAKE 1 TABLET(20 MG) BY MOUTH DAILY AS NEEDED 90 tablet 1   Insulin  Syringe-Needle U-100 (B-D INSULIN SYRINGE 1CC/25GX1") 25G X 1" 1 ML MISC USE AS DIRECTED EVERY 2 WEEKS 50 each 2   isosorbide mononitrate (IMDUR) 30 MG 24 hr tablet Take 1 tablet (30 mg total) by mouth daily. 30 tablet 4   levofloxacin (LEVAQUIN) 500 MG tablet Take 1 tablet (500 mg total) by mouth daily. 10 tablet 0   levothyroxine (SYNTHROID) 88 MCG tablet TAKE 1 TABLET(88 MCG) BY MOUTH DAILY 90 tablet 3   metoprolol tartrate (LOPRESSOR) 50 MG tablet TAKE 1 TABLET 2 HOURS PRIOR TO TEST 1 tablet 0   nitroGLYCERIN (NITROSTAT) 0.4 MG SL tablet DISSOLVE 1 TABLET UNDER THE TONGUE AS DIRECTED. MAX 3 DOSES. CALL 911 IF NEEDING ALL 3 DOSES 25 tablet 2   nystatin (MYCOSTATIN) 100000 UNIT/ML suspension Take 5 mLs (500,000 Units total) by mouth 4 (four) times daily. 60 mL 0   Omega-3 Fatty Acids (FISH OIL) 1000 MG CAPS Take 1 capsule by mouth daily.     phenytoin (DILANTIN) 100 MG ER capsule TAKE 2 CAPSULES BY MOUTH EVERY MORNING AND 1 CAPSULE EVERY EVENING 270 capsule 3   promethazine (PHENERGAN) 12.5 MG tablet Take by mouth.     Respiratory Therapy Supplies (FLUTTER) DEVI Use after nebulization treatments 1 each 0   sodium chloride (MURO 128) 5 % ophthalmic solution Place  1 drop into both eyes as needed for irritation.     SYRINGE-NEEDLE, DISP, 3 ML (BD ECLIPSE SYRINGE) 25G X 1" 3 ML MISC Use sq q 2 wks 50 each 3   verapamil (CALAN-SR) 240 MG CR tablet TAKE 1 TABLET(240 MG) BY MOUTH AT BEDTIME 90 tablet 2   cyanocobalamin (VITAMIN B12) 1000 MCG/ML injection INJECT 1ML IN THE MUSCLE EVERY 14 DAYS. 10 mL 5   escitalopram (LEXAPRO) 20 MG tablet TAKE 1 TABLET(20 MG) BY MOUTH DAILY 90 tablet 3   traZODone (DESYREL) 50 MG tablet TAKE 4 TABLETS(200 MG) BY MOUTH AT BEDTIME 360 tablet 3   acetaminophen-codeine (TYLENOL #3) 300-30 MG tablet Take 1 tablet by mouth every 6 (six) hours as needed. (Patient not taking: Reported on 03/02/2022) 60 tablet 3   diphenoxylate-atropine (LOMOTIL) 2.5-0.025 MG tablet Take 1-2  tablets by mouth 4 (four) times daily as needed for diarrhea or loose stools. (Patient not taking: Reported on 03/02/2022) 60 tablet 1   No facility-administered medications prior to visit.    ROS: Review of Systems  Constitutional:  Positive for fatigue and fever. Negative for activity change, appetite change, chills and unexpected weight change.  HENT:  Negative for congestion, mouth sores and sinus pressure.   Eyes:  Negative for visual disturbance.  Respiratory:  Positive for chest tightness, shortness of breath and wheezing. Negative for cough.   Cardiovascular:  Negative for leg swelling.  Gastrointestinal:  Positive for diarrhea and nausea. Negative for abdominal pain and vomiting.  Genitourinary:  Negative for difficulty urinating, frequency, urgency and vaginal pain.  Musculoskeletal:  Negative for back pain and gait problem.  Skin:  Positive for rash. Negative for pallor.  Neurological:  Positive for weakness. Negative for dizziness, tremors, numbness and headaches.  Psychiatric/Behavioral:  Negative for confusion and sleep disturbance.     Objective:  BP 124/72 (BP Location: Right Arm, Patient Position: Sitting, Cuff Size: Normal)   Pulse 75   Temp 98.4 F (36.9 C) (Oral)   Ht 5' (1.524 m)   Wt 142 lb (64.4 kg)   SpO2 98%   BMI 27.73 kg/m   BP Readings from Last 3 Encounters:  03/04/22 124/72  03/02/22 126/70  02/17/22 120/68    Wt Readings from Last 3 Encounters:  03/04/22 142 lb (64.4 kg)  03/02/22 145 lb 9.6 oz (66 kg)  02/17/22 153 lb 12.8 oz (69.8 kg)    Physical Exam Constitutional:      General: She is not in acute distress.    Appearance: She is well-developed. She is obese.  HENT:     Head: Normocephalic.     Right Ear: External ear normal.     Left Ear: External ear normal.     Nose: Nose normal.  Eyes:     General:        Right eye: No discharge.        Left eye: No discharge.     Conjunctiva/sclera: Conjunctivae normal.     Pupils:  Pupils are equal, round, and reactive to light.  Neck:     Thyroid: No thyromegaly.     Vascular: No JVD.     Trachea: No tracheal deviation.  Cardiovascular:     Rate and Rhythm: Normal rate and regular rhythm.     Heart sounds: Normal heart sounds.  Pulmonary:     Effort: No respiratory distress.     Breath sounds: No stridor. No wheezing or rales.  Chest:     Chest wall: No  tenderness.  Abdominal:     General: Bowel sounds are normal. There is no distension.     Palpations: Abdomen is soft. There is no mass.     Tenderness: There is no abdominal tenderness. There is no guarding or rebound.  Musculoskeletal:        General: No tenderness.     Cervical back: Normal range of motion and neck supple. No rigidity.  Lymphadenopathy:     Cervical: No cervical adenopathy.  Skin:    Findings: Rash present. No erythema.  Neurological:     Mental Status: She is oriented to person, place, and time.     Cranial Nerves: No cranial nerve deficit.     Motor: Weakness present. No abnormal muscle tone.     Coordination: Coordination normal.     Gait: Gait abnormal.     Deep Tendon Reflexes: Reflexes normal.  Psychiatric:        Behavior: Behavior normal.        Thought Content: Thought content normal.        Judgment: Judgment normal.   Weak Thrush in the mouth    A total time of 45 minutes was spent preparing to see the patient, reviewing tests, x-rays, operative reports and other medical records.  Also, obtaining history and performing comprehensive physical exam.  Additionally, counseling the patient regarding the above listed issues.   Finally, documenting clinical information in the health records, coordination of care, educating the patient - diarrhea, nausea, URI. It is a complex case.   Lab Results  Component Value Date   WBC 7.7 06/10/2021   HGB 13.2 06/10/2021   HCT 39.0 06/10/2021   PLT 367 12/18/2021   GLUCOSE 72 11/03/2021   CHOL 196 11/03/2021   TRIG 119 11/03/2021    HDL 63 11/03/2021   LDLDIRECT 165.0 09/26/2009   LDLCALC 112 (H) 11/03/2021   ALT 15 12/18/2021   AST 21 12/18/2021   NA 142 11/03/2021   K 4.5 11/03/2021   CL 100 11/03/2021   CREATININE 0.63 12/18/2021   BUN 8 12/18/2021   CO2 28 11/03/2021   TSH 2.75 06/10/2021   INR 1.0 12/18/2021   HGBA1C 5.7 06/10/2021    MR Abdomen W Wo Contrast  Result Date: 12/29/2021 CLINICAL DATA:  Possible hepatic cirrhosis and indeterminate liver lesion on recent noncontrast chest CT. EXAM: MRI ABDOMEN WITHOUT AND WITH CONTRAST TECHNIQUE: Multiplanar multisequence MR imaging of the abdomen was performed both before and after the administration of intravenous contrast. CONTRAST:  87m GADAVIST GADOBUTROL 1 MMOL/ML IV SOLN COMPARISON:  Noncontrast chest CT on 09/14/2021 FINDINGS: Lower chest: No acute findings. Hepatobiliary: A few tiny sub-cm hepatic cysts are seen in the right and left hepatic lobes. No hepatic masses identified. No definite gross morphologic findings of cirrhosis identified. Prior cholecystectomy. No evidence of biliary obstruction. Pancreas:  No mass or inflammatory changes. Spleen:  Within normal limits in size and appearance. Adrenals/Urinary Tract: No suspicious masses identified. No evidence of hydronephrosis. Stomach/Bowel: Unremarkable. Vascular/Lymphatic: No pathologically enlarged lymph nodes identified. No acute vascular findings. Other:  None. Musculoskeletal:  No suspicious bone lesions identified. IMPRESSION: Several tiny sub-cm hepatic cysts. No evidence of hepatic neoplasm, or definite imaging signs of cirrhosis. Electronically Signed   By: JMarlaine HindM.D.   On: 12/29/2021 15:42    Assessment & Plan:   Problem List Items Addressed This Visit     Vitamin D deficiency   Relevant Medications   cyanocobalamin (VITAMIN B12) 1000 MCG/ML injection  Urticaria   Relevant Medications   cyanocobalamin (VITAMIN B12) 1000 MCG/ML injection   Thrush    Started on PO Nystatin - using  it now      Relevant Medications   ketoconazole (NIZORAL) 2 % cream   Pulmonary emphysema (HCC)    Worse Depo-medrol inj 120 mg IM CXR      Relevant Medications   promethazine-dextromethorphan (PROMETHAZINE-DM) 6.25-15 MG/5ML syrup   Other Relevant Orders   DG Chest 2 View   CBC with Differential/Platelet   Clostridium difficile EIA   Nausea    New Zofran prn      Relevant Orders   CBC with Differential/Platelet   Magnesium   HYPERKALEMIA   Relevant Medications   cyanocobalamin (VITAMIN B12) 1000 MCG/ML injection   Diarrhea - Primary    Recurrent  C diff test Lomotil prn      Relevant Orders   CBC with Differential/Platelet   Comprehensive metabolic panel   Clostridium difficile EIA   Magnesium   POC COVID-19 (Completed)   Other Visit Diagnoses     B12 deficiency       Relevant Medications   cyanocobalamin (VITAMIN B12) 1000 MCG/ML injection         Meds ordered this encounter  Medications   ondansetron (ZOFRAN) 4 MG tablet    Sig: Take 1 tablet (4 mg total) by mouth every 8 (eight) hours as needed for nausea or vomiting.    Dispense:  30 tablet    Refill:  2   diphenoxylate-atropine (LOMOTIL) 2.5-0.025 MG tablet    Sig: Take 1 tablet by mouth 4 (four) times daily as needed for diarrhea or loose stools.    Dispense:  60 tablet    Refill:  2   promethazine-dextromethorphan (PROMETHAZINE-DM) 6.25-15 MG/5ML syrup    Sig: Take 5 mLs by mouth 4 (four) times daily as needed for cough.    Dispense:  240 mL    Refill:  1   cyanocobalamin (VITAMIN B12) 1000 MCG/ML injection    Sig: INJECT 1ML IN THE MUSCLE EVERY 14 DAYS.    Dispense:  10 mL    Refill:  5   escitalopram (LEXAPRO) 20 MG tablet    Sig: TAKE 1 TABLET(20 MG) BY MOUTH DAILY    Dispense:  90 tablet    Refill:  3   traZODone (DESYREL) 50 MG tablet    Sig: TAKE 4 TABLETS(200 MG) BY MOUTH AT BEDTIME    Dispense:  360 tablet    Refill:  3   ketoconazole (NIZORAL) 2 % cream    Sig: Apply 1  Application topically daily.    Dispense:  60 g    Refill:  1      Follow-up: Return in about 10 days (around 03/14/2022) for a follow-up visit.  Walker Kehr, MD

## 2022-03-04 NOTE — Assessment & Plan Note (Addendum)
Started on PO Nystatin - using it now

## 2022-03-04 NOTE — Assessment & Plan Note (Signed)
Recurrent  C diff test Lomotil prn

## 2022-03-04 NOTE — Assessment & Plan Note (Signed)
New Zofran prn

## 2022-03-04 NOTE — Assessment & Plan Note (Signed)
Worse Depo-medrol inj 120 mg IM CXR

## 2022-03-09 ENCOUNTER — Encounter: Payer: Self-pay | Admitting: Internal Medicine

## 2022-03-09 ENCOUNTER — Ambulatory Visit (INDEPENDENT_AMBULATORY_CARE_PROVIDER_SITE_OTHER): Payer: Medicare Other | Admitting: Internal Medicine

## 2022-03-09 VITALS — BP 110/68 | HR 75 | Ht 60.0 in | Wt 143.0 lb

## 2022-03-09 DIAGNOSIS — R197 Diarrhea, unspecified: Secondary | ICD-10-CM

## 2022-03-09 DIAGNOSIS — K7689 Other specified diseases of liver: Secondary | ICD-10-CM | POA: Diagnosis not present

## 2022-03-09 DIAGNOSIS — I25111 Atherosclerotic heart disease of native coronary artery with angina pectoris with documented spasm: Secondary | ICD-10-CM | POA: Diagnosis not present

## 2022-03-09 NOTE — Patient Instructions (Signed)
Increase fluid intake to 64 oz daily  Follow up as needed   If you are age 72 or older, your body mass index should be between 23-30. Your Body mass index is 27.93 kg/m. If this is out of the aforementioned range listed, please consider follow up with your Primary Care Provider.  If you are age 103 or younger, your body mass index should be between 19-25. Your Body mass index is 27.93 kg/m. If this is out of the aformentioned range listed, please consider follow up with your Primary Care Provider.   ________________________________________________________  The [place] [facility] providers would like to encourage you to use [facility] to communicate with providers for non-urgent requests or questions.  Due to long hold times on the telephone, sending your provider a message by [facility] may be a faster and more efficient way to get a response.  Please allow 48 business hours for a response.  Please remember that this is for non-urgent requests.  _______________________________________________________   Thank you for entrusting me with your care and choosing [facility].  Dr Hilarie Fredrickson

## 2022-03-09 NOTE — Progress Notes (Signed)
Subjective:    Patient ID: Veronica [name], female    DOB: 08/19/1950, 71 y.o.   MRN: [id number]  HPI Veronica [name] is a 71 year old female with a history of GERD status post Nissen, B12 deficiency, history of colon polyps, family history of colon cancer in first-degree relative who is here for follow-up.  She is here alone today and I last saw her on 12/18/2021 to evaluate abnormal liver imaging.  She also has a history of bronchiectasis, dyslipidemia, epilepsy, hypertension, hypothyroidism, Sjogren's syndrome, anxiety and depression.  We did a complete liver evaluation with blood work and repeated MRI.  She had some liver cyst but no evidence of advanced liver disease, fibrosis or cirrhosis.  We reviewed all this together today.  No new GI symptoms other than below.  She developed a flare of her bronchiectasis on 02/17/2022 and was treated with a steroid injection and 10 days of Levaquin.  Lung symptoms improved but about a week after stopping antibiotic she developed chills, aches and severe diarrhea.  She saw Dr. Alain Marion and stool studies were ordered.  She also use Lomotil 4 times a day for several days.  She is now off of Lomotil and the diarrhea has resolved.  Other than extreme thirst and darker urine she has no ongoing GI symptoms.  The diarrhea has not returned.  She does have slightly decreased appetite compared to her normal.   Review of Systems As per HPI, otherwise negative  Current Medications, Allergies, Past Medical History, Past Surgical History, Family History and Social History were reviewed in [employer] record.    Objective:   Physical Exam BP 110/68   Pulse 75   Ht 5' (1.524 m)   Wt 143 lb (64.9 kg)   SpO2 96%   BMI 27.93 kg/m  Gen: awake, alert, NAD HEENT: anicteric Neuro: nonfocal   Fib-4 -- 1.06, low risk for advanced fibrosis  Lab Results  Component Value Date   INR 1.0 12/18/2021   INR 1.0 ratio 11/07/2009   CMP      Component Value Date/Time   NA 141 03/04/2022 1158   NA 142 11/03/2021 1144   K 3.4 (L) 03/04/2022 1158   CL 97 03/04/2022 1158   CO2 33 (H) 03/04/2022 1158   GLUCOSE 87 03/04/2022 1158   BUN 8 03/04/2022 1158   BUN 10 11/03/2021 1144   CREATININE 0.64 03/04/2022 1158   CREATININE 0.61 12/03/2019 1210   CALCIUM 6.6 Repeated and verified X2. (L) 03/04/2022 1158   PROT 7.7 03/04/2022 1158   ALBUMIN 4.3 03/04/2022 1158   AST 24 03/04/2022 1158   ALT 13 03/04/2022 1158   ALKPHOS 113 03/04/2022 1158   BILITOT 0.4 03/04/2022 1158   GFRNONAA 92 12/03/2019 1210   GFRAA 106 12/03/2019 1210     MRI ABDOMEN WITHOUT AND WITH CONTRAST   TECHNIQUE: Multiplanar multisequence MR imaging of the abdomen was performed both before and after the administration of intravenous contrast.   CONTRAST:  59m GADAVIST GADOBUTROL 1 MMOL/ML IV SOLN   COMPARISON:  Noncontrast chest CT on 09/14/2021   FINDINGS: Lower chest: No acute findings.   Hepatobiliary: A few tiny sub-cm hepatic cysts are seen in the right and left hepatic lobes. No hepatic masses identified. No definite gross morphologic findings of cirrhosis identified. Prior cholecystectomy. No evidence of biliary obstruction.   Pancreas:  No mass or inflammatory changes.   Spleen:  Within normal limits in size and appearance.   Adrenals/Urinary Tract: No  suspicious masses identified. No evidence of hydronephrosis.   Stomach/Bowel: Unremarkable.   Vascular/Lymphatic: No pathologically enlarged lymph nodes identified. No acute vascular findings.   Other:  None.   Musculoskeletal:  No suspicious bone lesions identified.   IMPRESSION: Several tiny sub-cm hepatic cysts. No evidence of hepatic neoplasm, or definite imaging signs of cirrhosis.     Electronically Signed   By: Marlaine Hind M.D.   On: 12/29/2021 15:42    Assessment & Plan:  71 year old female with a history of GERD status post Nissen, B12 deficiency, history of  colon polyps, family history of colon cancer in first-degree relative who is here for follow-up.   Resolved diarrhea/dehydration --she was treated with Levaquin and so the diarrhea was either antibiotic associated diarrhea or a viral gastroenteritis occurring shortly after this therapy.  C. difficile was in the differential but given that it has resolved now off therapy this is very unlikely and I do not think she needs to submit the stool studies previously recommended by primary care. -- I have encouraged her to increase her fluid intake as she is likely slightly volume depleted -- She has follow-up with primary care on Friday of this week  2.  Abnormal liver imaging --after evaluation she does not have cirrhosis nor any concerning liver disease that we were able to identify.  This is very reassuring. -- Reassurance provided, no further evaluation needed -- Subcentimeter hepatic cysts appear benign -- Liver enzymes remain normal  3.  History of colon polyps and family history of colon cancer --surveillance colonoscopy recommended around January 2026  Follow-up as needed  30 minutes total spent today including patient facing time, coordination of care, reviewing medical history/procedures/pertinent radiology studies, and documentation of the encounter.

## 2022-03-11 NOTE — Telephone Encounter (Signed)
Called [employer] asst back spoke w/ rep Colletta [place]. Inform her never received application on pt. She states will refaxed will take 48 hours to process email. [employer] # [phone].Marland KitchenJohny Chess

## 2022-03-12 ENCOUNTER — Ambulatory Visit (INDEPENDENT_AMBULATORY_CARE_PROVIDER_SITE_OTHER): Payer: Medicare Other | Admitting: Internal Medicine

## 2022-03-12 ENCOUNTER — Encounter: Payer: Self-pay | Admitting: Internal Medicine

## 2022-03-12 VITALS — BP 120/72 | HR 75 | Temp 98.2°F | Ht 60.0 in | Wt 140.0 lb

## 2022-03-12 DIAGNOSIS — B370 Candidal stomatitis: Secondary | ICD-10-CM

## 2022-03-12 DIAGNOSIS — J439 Emphysema, unspecified: Secondary | ICD-10-CM

## 2022-03-12 DIAGNOSIS — R21 Rash and other nonspecific skin eruption: Secondary | ICD-10-CM | POA: Diagnosis not present

## 2022-03-12 DIAGNOSIS — R5383 Other fatigue: Secondary | ICD-10-CM

## 2022-03-12 DIAGNOSIS — I25111 Atherosclerotic heart disease of native coronary artery with angina pectoris with documented spasm: Secondary | ICD-10-CM | POA: Diagnosis not present

## 2022-03-12 MED ORDER — METHYLPREDNISOLONE ACETATE 80 MG/ML IJ SUSP
80.0000 mg | Freq: Once | INTRAMUSCULAR | Status: AC
  Administered 2022-03-12: 80 mg via INTRAMUSCULAR

## 2022-03-12 NOTE — Assessment & Plan Note (Signed)
Improving Use baking soda sol prn

## 2022-03-12 NOTE — Assessment & Plan Note (Signed)
Better Repeat Depo-medrol inj 80 mg IM

## 2022-03-12 NOTE — Assessment & Plan Note (Signed)
Worse Depo-medrol inj 80 mg IM

## 2022-03-12 NOTE — Patient Instructions (Signed)
Rinse your mouth: dissolve 1 teaspoon of baking soda in 1/2 cup warm water

## 2022-03-12 NOTE — Assessment & Plan Note (Signed)
Purple rash on chest ?etiology Derm ref

## 2022-03-12 NOTE — Progress Notes (Signed)
Subjective:  Patient ID: Veronica [name], female    DOB: 1950-04-19  Age: 71 y.o. MRN: [id number]  CC: Follow-up (Getting better but not back to self , no appetite no more diaherra , still has some thrush)   HPI Veronica [name] presents for diarrhea, nausea - stopped F/u on rash better, not  resolved F/u on thrush - better  Outpatient Medications Prior to Visit  Medication Sig Dispense Refill   acetaminophen-codeine (TYLENOL #3) 300-30 MG tablet Take 1 tablet by mouth every 6 (six) hours as needed. 60 tablet 3   albuterol (PROVENTIL) (2.5 MG/3ML) 0.083% nebulizer solution USE 1 VIAL IN NEBULIZER 2 TIMES DAILY. Generic: VENTOLIN 180 mL 3   albuterol (VENTOLIN HFA) 108 (90 Base) MCG/ACT inhaler Inhale 2 puffs into the lungs every 4 (four) hours as needed for wheezing or shortness of breath. 18 g 11   aspirin 81 MG chewable tablet Chew by mouth daily.     atorvastatin (LIPITOR) 40 MG tablet TAKE 1 TABLET BY MOUTH EVERY DAY 90 tablet 1   budesonide (PULMICORT) 0.5 MG/2ML nebulizer solution Take 2 mLs (0.5 mg total) by nebulization in the morning and at bedtime. 120 mL 11   calcium carbonate (OSCAL) 1500 (600 Ca) MG TABS tablet Take 600 mg of elemental calcium by mouth 2 (two) times daily with a meal.      chlorhexidine (PERIDEX) 0.12 % solution Use to wash mouth twice a day     Cholecalciferol (VITAMIN D3) 50 MCG (2000 UT) capsule Take 1 capsule (2,000 Units total) by mouth daily. 100 capsule 3   clonazePAM (KLONOPIN) 1 MG tablet TAKE 2 TABLETS BY MOUTH EVERY NIGHT AT BEDTIME AND 1/2 TABLET EXTRA IN DAYTIME AS NEEDED 225 tablet 1   cyanocobalamin (VITAMIN B12) 1000 MCG/ML injection INJECT 1ML IN THE MUSCLE EVERY 14 DAYS. 10 mL 5   diphenoxylate-atropine (LOMOTIL) 2.5-0.025 MG tablet Take 1 tablet by mouth 4 (four) times daily as needed for diarrhea or loose stools. 60 tablet 2   Docusate Calcium (STOOL SOFTENER PO) Take 100 mg by mouth as needed (for constipation).     escitalopram (LEXAPRO)  20 MG tablet TAKE 1 TABLET(20 MG) BY MOUTH DAILY 90 tablet 3   ezetimibe (ZETIA) 10 MG tablet Take 1 tablet (10 mg total) by mouth daily. 90 tablet 3   furosemide (LASIX) 20 MG tablet TAKE 1 TABLET(20 MG) BY MOUTH DAILY AS NEEDED 90 tablet 1   Insulin Syringe-Needle U-100 (B-D INSULIN SYRINGE 1CC/25GX1") 25G X 1" 1 ML MISC USE AS DIRECTED EVERY 2 WEEKS 50 each 2   isosorbide mononitrate (IMDUR) 30 MG 24 hr tablet Take 1 tablet (30 mg total) by mouth daily. 30 tablet 4   ketoconazole (NIZORAL) 2 % cream Apply 1 Application topically daily. 60 g 1   levothyroxine (SYNTHROID) 88 MCG tablet TAKE 1 TABLET(88 MCG) BY MOUTH DAILY 90 tablet 3   metoprolol tartrate (LOPRESSOR) 50 MG tablet TAKE 1 TABLET 2 HOURS PRIOR TO TEST 1 tablet 0   nitroGLYCERIN (NITROSTAT) 0.4 MG SL tablet DISSOLVE 1 TABLET UNDER THE TONGUE AS DIRECTED. MAX 3 DOSES. CALL 911 IF NEEDING ALL 3 DOSES 25 tablet 2   nystatin (MYCOSTATIN) 100000 UNIT/ML suspension Take 5 mLs (500,000 Units total) by mouth 4 (four) times daily. 60 mL 0   Omega-3 Fatty Acids (FISH OIL) 1000 MG CAPS Take 1 capsule by mouth daily.     ondansetron (ZOFRAN) 4 MG tablet Take 1 tablet (4 mg total) by mouth  every 8 (eight) hours as needed for nausea or vomiting. 30 tablet 2   phenytoin (DILANTIN) 100 MG ER capsule TAKE 2 CAPSULES BY MOUTH EVERY MORNING AND 1 CAPSULE EVERY EVENING 270 capsule 3   promethazine (PHENERGAN) 12.5 MG tablet Take by mouth.     promethazine-dextromethorphan (PROMETHAZINE-DM) 6.25-15 MG/5ML syrup Take 5 mLs by mouth 4 (four) times daily as needed for cough. 240 mL 1   Respiratory Therapy Supplies (FLUTTER) DEVI Use after nebulization treatments 1 each 0   sodium chloride (MURO 128) 5 % ophthalmic solution Place 1 drop into both eyes as needed for irritation.     SYRINGE-NEEDLE, DISP, 3 ML (BD ECLIPSE SYRINGE) 25G X 1" 3 ML MISC Use sq q 2 wks 50 each 3   traZODone (DESYREL) 50 MG tablet TAKE 4 TABLETS(200 MG) BY MOUTH AT BEDTIME 360 tablet  3   verapamil (CALAN-SR) 240 MG CR tablet TAKE 1 TABLET(240 MG) BY MOUTH AT BEDTIME 90 tablet 2   cariprazine (VRAYLAR) 1.5 MG capsule Take 1 capsule (1.5 mg total) by mouth daily. NDC#- 41937-902-40 Exp 10/2023 Lot# [id number] 7 capsule 4   No facility-administered medications prior to visit.    ROS: Review of Systems  Constitutional:  Positive for fatigue and unexpected weight change. Negative for activity change, appetite change and chills.  HENT:  Negative for congestion, mouth sores and sinus pressure.   Eyes:  Negative for visual disturbance.  Respiratory:  Negative for cough and chest tightness.   Gastrointestinal:  Negative for abdominal pain, diarrhea, nausea and vomiting.  Genitourinary:  Negative for difficulty urinating, frequency and vaginal pain.  Musculoskeletal:  Negative for back pain and gait problem.  Skin:  Negative for pallor and rash.  Neurological:  Negative for dizziness, tremors, weakness, numbness and headaches.  Psychiatric/Behavioral:  Positive for decreased concentration. Negative for confusion, sleep disturbance and suicidal ideas.     Objective:  BP 120/72 (BP Location: Left Arm, Patient Position: Sitting, Cuff Size: Normal)   Pulse 75   Temp 98.2 F (36.8 C) (Oral)   Ht 5' (1.524 m)   Wt 140 lb (63.5 kg)   SpO2 98%   BMI 27.34 kg/m   BP Readings from Last 3 Encounters:  03/12/22 120/72  03/09/22 110/68  03/04/22 124/72    Wt Readings from Last 3 Encounters:  03/12/22 140 lb (63.5 kg)  03/09/22 143 lb (64.9 kg)  03/04/22 142 lb (64.4 kg)    Physical Exam Constitutional:      General: She is not in acute distress.    Appearance: Normal appearance. She is well-developed.  HENT:     Head: Normocephalic.     Right Ear: External ear normal.     Left Ear: External ear normal.     Nose: Nose normal.  Eyes:     General:        Right eye: No discharge.        Left eye: No discharge.     Conjunctiva/sclera: Conjunctivae normal.     Pupils:  Pupils are equal, round, and reactive to light.  Neck:     Thyroid: No thyromegaly.     Vascular: No JVD.     Trachea: No tracheal deviation.  Cardiovascular:     Rate and Rhythm: Normal rate and regular rhythm.     Heart sounds: Normal heart sounds.  Pulmonary:     Effort: No respiratory distress.     Breath sounds: No stridor. No wheezing.  Abdominal:  General: Bowel sounds are normal. There is no distension.     Palpations: Abdomen is soft. There is no mass.     Tenderness: There is no abdominal tenderness. There is no guarding or rebound.  Musculoskeletal:        General: No tenderness.     Cervical back: Normal range of motion and neck supple. No rigidity.  Lymphadenopathy:     Cervical: No cervical adenopathy.  Skin:    Findings: No erythema or rash.  Neurological:     Mental Status: She is oriented to person, place, and time.     Cranial Nerves: No cranial nerve deficit.     Motor: No abnormal muscle tone.     Coordination: Coordination abnormal.     Gait: Gait abnormal.     Deep Tendon Reflexes: Reflexes normal.  Psychiatric:        Behavior: Behavior normal.        Thought Content: Thought content normal.        Judgment: Judgment normal.   Rash under breasts: purple papular rash on chest ?etiology Derm ref Skin tags Rash on V-neck area Lungs CTA   Lab Results  Component Value Date   WBC 11.0 (H) 03/04/2022   HGB 14.3 03/04/2022   HCT 42.7 03/04/2022   PLT 396.0 03/04/2022   GLUCOSE 87 03/04/2022   CHOL 196 11/03/2021   TRIG 119 11/03/2021   HDL 63 11/03/2021   LDLDIRECT 165.0 09/26/2009   LDLCALC 112 (H) 11/03/2021   ALT 13 03/04/2022   AST 24 03/04/2022   NA 141 03/04/2022   K 3.4 (L) 03/04/2022   CL 97 03/04/2022   CREATININE 0.64 03/04/2022   BUN 8 03/04/2022   CO2 33 (H) 03/04/2022   TSH 2.75 06/10/2021   INR 1.0 12/18/2021   HGBA1C 5.7 06/10/2021    MR Abdomen W Wo Contrast  Result Date: 12/29/2021 CLINICAL DATA:  Possible hepatic  cirrhosis and indeterminate liver lesion on recent noncontrast chest CT. EXAM: MRI ABDOMEN WITHOUT AND WITH CONTRAST TECHNIQUE: Multiplanar multisequence MR imaging of the abdomen was performed both before and after the administration of intravenous contrast. CONTRAST:  41m GADAVIST GADOBUTROL 1 MMOL/ML IV SOLN COMPARISON:  Noncontrast chest CT on 09/14/2021 FINDINGS: Lower chest: No acute findings. Hepatobiliary: A few tiny sub-cm hepatic cysts are seen in the right and left hepatic lobes. No hepatic masses identified. No definite gross morphologic findings of cirrhosis identified. Prior cholecystectomy. No evidence of biliary obstruction. Pancreas:  No mass or inflammatory changes. Spleen:  Within normal limits in size and appearance. Adrenals/Urinary Tract: No suspicious masses identified. No evidence of hydronephrosis. Stomach/Bowel: Unremarkable. Vascular/Lymphatic: No pathologically enlarged lymph nodes identified. No acute vascular findings. Other:  None. Musculoskeletal:  No suspicious bone lesions identified. IMPRESSION: Several tiny sub-cm hepatic cysts. No evidence of hepatic neoplasm, or definite imaging signs of cirrhosis. Electronically Signed   By: JMarlaine HindM.D.   On: 12/29/2021 15:42    Assessment & Plan:   Problem List Items Addressed This Visit       Respiratory   Pulmonary emphysema (HCC)    Worse Depo-medrol inj 80 mg IM        Digestive   Thrush    Improving Use baking soda sol prn        Musculoskeletal and Integument   RASH-NONVESICULAR    Purple rash on chest ?etiology Derm ref      Relevant Orders   Ambulatory referral to Dermatology  Other   Fatigue - Primary    Better Repeat Depo-medrol inj 80 mg IM         Meds ordered this encounter  Medications   methylPREDNISolone acetate (DEPO-MEDROL) injection 80 mg      Follow-up: Return in about 2 months (around 05/13/2022) for a follow-up visit.  Walker Kehr, MD

## 2022-03-16 ENCOUNTER — Ambulatory Visit (INDEPENDENT_AMBULATORY_CARE_PROVIDER_SITE_OTHER): Payer: Medicare Other | Admitting: Psychology

## 2022-03-16 DIAGNOSIS — F331 Major depressive disorder, recurrent, moderate: Secondary | ICD-10-CM | POA: Diagnosis not present

## 2022-03-16 NOTE — Progress Notes (Signed)
[facility] Counselor/Therapist Progress Note  Patient ID: SAMERA MACY, MRN: [id number],    Date: 03/16/2022  Time Spent: 2:00pm - 2:45pm    45 minutes   Treatment Type: Individual Therapy  Reported Symptoms: worrying  Mental Status Exam: Appearance:  Casual     Behavior: Appropriate  Motor: Normal  Speech/Language:  Normal Rate  Affect: Appropriate  Mood: normal  Thought process: normal  Thought content:   WNL  Sensory/Perceptual disturbances:   WNL  Orientation: oriented to person, place, time/date, and situation  Attention: Good  Concentration: Good  Memory: WNL  Fund of knowledge:  Good  Insight:   Good  Judgment:  Good  Impulse Control: Good   Risk Assessment: Danger to Self:  No Self-injurious Behavior: No Danger to Others: No Duty to Warn:no Physical Aggression / Violence:No  Access to Firearms a concern: No  Gang Involvement:No   Subjective: Pt present for individual therapy via  phone.  Pt consents to telehealth session due to [place] 19 pandemic. Location of pt: home Location of therapist: home office.  Pt talked about her health.  She has not been feeling well which has made her feel more depressed.   Pt talked about the holidays.   She was alone on Christmas and it was hard on her.    Pt was not invited to a family gathering bc she was sick.   It hurt pt's feelings.   Helped pt process her feelings.   Pt talked about an upsetting interaction with her neighbor.   Addressed the interaction and helped pt not personalize the neighbor's comments.  Worked on additional coping and self care strategies.   Provided supportive therapy.    Interventions: Cognitive Behavioral Therapy and Insight-Oriented  Diagnosis:  F33.1  Plan of Care: Recommend ongoing therapy.   Pt participated in setting therapy goals.  Pt wants to have someone to talk to and improve coping skills.   Plan to continue to meet every two weeks.   Pt is progressing toward  treatment goals.   Treatment Plan (Treatment Plan Target Date: 06/09/2022) Client Abilities/Strengths  Pt is bright, engaging, and motivated for therapy.   Client Treatment Preferences  Individual therapy.  Client Statement of Needs  Improve coping skills.  Symptoms  Depressed or irritable mood. Lack of energy. Feelings of hopelessness, worthlessness, or inappropriate guilt. Low self-esteem. Unresolved grief issues.   Problems Addressed  Unipolar Depression Goals 1. Alleviate depressive symptoms and return to previous level of effective functioning. 2. Appropriately grieve the loss in order to normalize mood and to return to previously adaptive level of functioning. Objective Learn and implement behavioral strategies to overcome depression. Target Date: 2022-06-09 Frequency: Biweekly  Progress: 40 Modality: individual  Related Interventions Engage the client in "behavioral activation," increasing his/her activity level and contact with sources of reward, while identifying processes that inhibit activation.  Use behavioral techniques such as instruction, rehearsal, role-playing, role reversal, as needed, to facilitate activity in the client's daily life; reinforce success. Assist the client in developing skills that increase the likelihood of deriving pleasure from behavioral activation (e.g., assertiveness skills, developing an exercise plan, less internal/more external focus, increased social involvement); reinforce success. Objective Identify important people in life, past and present, and describe the quality, good and poor, of those relationships. Target Date: 2022-06-09 Frequency: Biweekly  Progress: 40 Modality: individual  Related Interventions Conduct Interpersonal Therapy beginning with the assessment of the client's "interpersonal inventory" of important past and  present relationships; develop a case formulation linking depression to grief, interpersonal role disputes, role  transitions, and/or interpersonal deficits). Objective Learn and implement problem-solving and decision-making skills. Target Date: 2022-06-09 Frequency: Biweekly  Progress: 40 Modality: individual  Related Interventions Conduct Problem-Solving Therapy using techniques such as psychoeducation, modeling, and role-playing to teach client problem-solving skills (i.e., defining a problem specifically, generating possible solutions, evaluating the pros and cons of each solution, selecting and implementing a plan of action, evaluating the efficacy of the plan, accepting or revising the plan); role-play application of the problem-solving skill to a real life issue. Encourage in the client the development of a positive problem orientation in which problems and solving them are viewed as a natural part of life and not something to be feared, despaired, or avoided. 3. Develop healthy interpersonal relationships that lead to the alleviation and help prevent the relapse of depression. 4. Develop healthy thinking patterns and beliefs about self, others, and the world that lead to the alleviation and help prevent the relapse of depression. 5. Recognize, accept, and cope with feelings of depression. Diagnosis F33.1  Conditions For Discharge Achievement of treatment goals and objectives   Clint Bolder, LCSW

## 2022-03-19 NOTE — Telephone Encounter (Signed)
Called pt inform her we still have not received the application. We have called twice. Pt is going to call them to have application mail to her..lmb

## 2022-03-19 NOTE — Telephone Encounter (Signed)
Pt called back she states she spoke w/ Alvie [employer]idelberg faxing application again.Marland KitchenJohny Chess

## 2022-03-22 ENCOUNTER — Telehealth: Payer: Self-pay | Admitting: *Deleted

## 2022-03-22 MED ORDER — CARIPRAZINE HCL 1.5 MG PO CAPS: 1.5000 mg | ORAL_CAPSULE | Freq: Every day | ORAL | 3 refills | Status: DC

## 2022-03-22 NOTE — Telephone Encounter (Signed)
Patient called back and stated that she can come in tomorrow to fill out forms

## 2022-03-22 NOTE — Telephone Encounter (Signed)
Called pt no answer LMOM we finally received application for Vraylar. Will go ahead and complete form, but there are (2) sections that pt has to complete. Can come today or tomorrow to sign.Marland KitchenJohny Chess

## 2022-03-22 NOTE — Telephone Encounter (Signed)
[employer] needs you to call them back so they can walk you through filing this since the application never arrived.  Please Call - [phone]  - Request to be walked through the application or have it emailed to you

## 2022-03-22 NOTE — Telephone Encounter (Signed)
Rec;d application this morning. Have been completed waiting on MD signature.Marland KitchenJohny [name]

## 2022-03-23 NOTE — Telephone Encounter (Signed)
Forms have been faxed back to [facility] @ [phone].Marland KitchenJohny Chess

## 2022-03-23 NOTE — Telephone Encounter (Signed)
PT visits today to sign their portion of the form. They were inquiring on the top of the sheet they signed, their full name was written in a slot that was only requesting the first name. I informed PT that this should still be fine but that I would make a note of it for her just incase.

## 2022-03-26 ENCOUNTER — Telehealth: Payer: Self-pay | Admitting: *Deleted

## 2022-03-26 NOTE — Telephone Encounter (Signed)
Pt came to signed HIPAA section. Faxed form w/ insurance card back to [facility].Marland KitchenJohny Chess

## 2022-03-26 NOTE — Telephone Encounter (Signed)
[  10:00 AM] Veronica [name], Veronica [name] - MRN [id number] is on the phone for you [10:03 AM] Veronica [name] Please call her - Veronica [name] called her and they are missing information on the application.  Called pt she states on the application they said she did not sign, and they need to know household information. Pt wanted me to call Veronica [name] to make sure of what they said. Called Veronica [name] spoke w/ Veronica [name] she states on the application pt did not sign HIPAA part, need to know how many stay in the house and also insurance card. Called pt inform her information. Pt is coming this afternoon to sign application. Printed insurance card to fax back.Marland KitchenJohny [name]

## 2022-03-29 ENCOUNTER — Ambulatory Visit (INDEPENDENT_AMBULATORY_CARE_PROVIDER_SITE_OTHER): Payer: Medicare Other | Admitting: Psychology

## 2022-03-29 DIAGNOSIS — F331 Major depressive disorder, recurrent, moderate: Secondary | ICD-10-CM | POA: Diagnosis not present

## 2022-03-29 NOTE — Progress Notes (Signed)
[facility] Counselor/Therapist Progress Note  Patient ID: Veronica [name], MRN: [id number],    Date: 03/29/2022  Time Spent: 12:00pm - 12:45pm    45 minutes   Treatment Type: Individual Therapy  Reported Symptoms: worrying  Mental Status Exam: Appearance:  Casual     Behavior: Appropriate  Motor: Normal  Speech/Language:  Normal Rate  Affect: Appropriate  Mood: normal  Thought process: normal  Thought content:   WNL  Sensory/Perceptual disturbances:   WNL  Orientation: oriented to person, place, time/date, and situation  Attention: Good  Concentration: Good  Memory: WNL  Fund of knowledge:  Good  Insight:   Good  Judgment:  Good  Impulse Control: Good   Risk Assessment: Danger to Self:  No Self-injurious Behavior: No Danger to Others: No Duty to Warn:no Physical Aggression / Violence:No  Access to Firearms a concern: No  Gang Involvement:No   Subjective: Pt present for individual therapy via  phone.  Pt consents to telehealth session due to [place] 19 pandemic. Location of pt: home Location of therapist: home office.  Pt talked about her health.   She had a setback and did not feel well for awhile.   This has been very discouraging for pt.  Helped pt process her feelings.  Pt talked about her relationship with her brother.  She had an interaction with him where he got frustrated with pt.  She cried and felt very bad.  Helped pt process her feelings and relationship dynamics.  Worked on additional coping and self care strategies.   Provided supportive therapy.    Interventions: Cognitive Behavioral Therapy and Insight-Oriented  Diagnosis:  F33.1  Plan of Care: Recommend ongoing therapy.   Pt participated in setting therapy goals.  Pt wants to have someone to talk to and improve coping skills.   Plan to continue to meet every two weeks.   Pt is progressing toward treatment goals.   Treatment Plan (Treatment Plan Target Date:  06/09/2022) Client Abilities/Strengths  Pt is bright, engaging, and motivated for therapy.   Client Treatment Preferences  Individual therapy.  Client Statement of Needs  Improve coping skills.  Symptoms  Depressed or irritable mood. Lack of energy. Feelings of hopelessness, worthlessness, or inappropriate guilt. Low self-esteem. Unresolved grief issues.   Problems Addressed  Unipolar Depression Goals 1. Alleviate depressive symptoms and return to previous level of effective functioning. 2. Appropriately grieve the loss in order to normalize mood and to return to previously adaptive level of functioning. Objective Learn and implement behavioral strategies to overcome depression. Target Date: 2022-06-09 Frequency: Biweekly  Progress: 40 Modality: individual  Related Interventions Engage the client in "behavioral activation," increasing his/her activity level and contact with sources of reward, while identifying processes that inhibit activation.  Use behavioral techniques such as instruction, rehearsal, role-playing, role reversal, as needed, to facilitate activity in the client's daily life; reinforce success. Assist the client in developing skills that increase the likelihood of deriving pleasure from behavioral activation (e.g., assertiveness skills, developing an exercise plan, less internal/more external focus, increased social involvement); reinforce success. Objective Identify important people in life, past and present, and describe the quality, good and poor, of those relationships. Target Date: 2022-06-09 Frequency: Biweekly  Progress: 40 Modality: individual  Related Interventions Conduct Interpersonal Therapy beginning with the assessment of the client's "interpersonal inventory" of important past and present relationships; develop a case formulation linking depression to grief, interpersonal role disputes, role transitions, and/or interpersonal deficits). Objective Learn  and implement problem-solving and decision-making skills. Target Date: 2022-06-09 Frequency: Biweekly  Progress: 40 Modality: individual  Related Interventions Conduct Problem-Solving Therapy using techniques such as psychoeducation, modeling, and role-playing to teach client problem-solving skills (i.e., defining a problem specifically, generating possible solutions, evaluating the pros and cons of each solution, selecting and implementing a plan of action, evaluating the efficacy of the plan, accepting or revising the plan); role-play application of the problem-solving skill to a real life issue. Encourage in the client the development of a positive problem orientation in which problems and solving them are viewed as a natural part of life and not something to be feared, despaired, or avoided. 3. Develop healthy interpersonal relationships that lead to the alleviation and help prevent the relapse of depression. 4. Develop healthy thinking patterns and beliefs about self, others, and the world that lead to the alleviation and help prevent the relapse of depression. 5. Recognize, accept, and cope with feelings of depression. Diagnosis F33.1  Conditions For Discharge Achievement of treatment goals and objectives   Clint Bolder, LCSW

## 2022-03-31 ENCOUNTER — Other Ambulatory Visit: Payer: Self-pay | Admitting: Cardiovascular Disease

## 2022-04-01 ENCOUNTER — Other Ambulatory Visit: Payer: Self-pay | Admitting: Internal Medicine

## 2022-04-02 NOTE — Telephone Encounter (Signed)
Pt call back checking status of refill.Marland KitchenJohny [name]

## 2022-04-04 ENCOUNTER — Other Ambulatory Visit: Payer: Self-pay | Admitting: Internal Medicine

## 2022-04-05 ENCOUNTER — Telehealth: Payer: Self-pay | Admitting: Internal Medicine

## 2022-04-05 NOTE — Telephone Encounter (Signed)
Patient came in requesting a new prescription for her medication phenytoin (DILANTIN) 100 MG ER capsule with refills. She states that the prescription has expired. Patient would like this prescription to her pharmacy on file  [place], [address]  .Best contact # for patient is [phone].

## 2022-04-07 NOTE — Telephone Encounter (Signed)
It was done a few days ago.  Thanks

## 2022-04-07 NOTE — Telephone Encounter (Signed)
Pls advise.Marland KitchenJohny [name]

## 2022-04-08 NOTE — Telephone Encounter (Signed)
Pt was advise refill was done and she did received.Marland KitchenJohny [name]

## 2022-04-12 ENCOUNTER — Ambulatory Visit (INDEPENDENT_AMBULATORY_CARE_PROVIDER_SITE_OTHER): Payer: Medicare Other | Admitting: Psychology

## 2022-04-12 DIAGNOSIS — F331 Major depressive disorder, recurrent, moderate: Secondary | ICD-10-CM | POA: Diagnosis not present

## 2022-04-12 NOTE — Progress Notes (Signed)
[facility] Counselor/Therapist Progress Note  Patient ID: Veronica [name], MRN: [id number],    Date: 04/12/2022  Time Spent: 12:00pm - 12:45pm    45 minutes   Treatment Type: Individual Therapy  Reported Symptoms: worrying  Mental Status Exam: Appearance:  Casual     Behavior: Appropriate  Motor: Normal  Speech/Language:  Normal Rate  Affect: Appropriate  Mood: normal  Thought process: normal  Thought content:   WNL  Sensory/Perceptual disturbances:   WNL  Orientation: oriented to person, place, time/date, and situation  Attention: Good  Concentration: Good  Memory: WNL  Fund of knowledge:  Good  Insight:   Good  Judgment:  Good  Impulse Control: Good   Risk Assessment: Danger to Self:  No Self-injurious Behavior: No Danger to Others: No Duty to Warn:no Physical Aggression / Violence:No  Access to Firearms a concern: No  Gang Involvement:No   Subjective: Pt present for individual therapy via  phone.  Pt consents to telehealth session due to [place] 19 pandemic. Location of pt: home Location of therapist: home office.  Pt talked about her health.  Pt has been feeling better which is a relief for her.  Pt has been going on her daily outings and has taken care of her bills and doing some house cleaning.   Pt states she feels down at times.  She feels like she made some financial mistakes that she is upset about.  Helped pt process her thoughts and worries.   Pt talked about her relationship with her brother.    Helped pt process her feelings and relationship dynamics.  Worked on additional coping and self care strategies.   Provided supportive therapy.    Interventions: Cognitive Behavioral Therapy and Insight-Oriented  Diagnosis:  F33.1  Plan of Care: Recommend ongoing therapy.   Pt participated in setting therapy goals.  Pt wants to have someone to talk to and improve coping skills.   Plan to continue to meet every two weeks.   Pt is  progressing toward treatment goals.   Treatment Plan (Treatment Plan Target Date: 06/09/2022) Client Abilities/Strengths  Pt is bright, engaging, and motivated for therapy.   Client Treatment Preferences  Individual therapy.  Client Statement of Needs  Improve coping skills.  Symptoms  Depressed or irritable mood. Lack of energy. Feelings of hopelessness, worthlessness, or inappropriate guilt. Low self-esteem. Unresolved grief issues.   Problems Addressed  Unipolar Depression Goals 1. Alleviate depressive symptoms and return to previous level of effective functioning. 2. Appropriately grieve the loss in order to normalize mood and to return to previously adaptive level of functioning. Objective Learn and implement behavioral strategies to overcome depression. Target Date: 2022-06-09 Frequency: Biweekly  Progress: 40 Modality: individual  Related Interventions Engage the client in "behavioral activation," increasing his/her activity level and contact with sources of reward, while identifying processes that inhibit activation.  Use behavioral techniques such as instruction, rehearsal, role-playing, role reversal, as needed, to facilitate activity in the client's daily life; reinforce success. Assist the client in developing skills that increase the likelihood of deriving pleasure from behavioral activation (e.g., assertiveness skills, developing an exercise plan, less internal/more external focus, increased social involvement); reinforce success. Objective Identify important people in life, past and present, and describe the quality, good and poor, of those relationships. Target Date: 2022-06-09 Frequency: Biweekly  Progress: 40 Modality: individual  Related Interventions Conduct Interpersonal Therapy beginning with the assessment of the client's "interpersonal inventory" of important past and present  relationships; develop a case formulation linking depression to grief, interpersonal  role disputes, role transitions, and/or interpersonal deficits). Objective Learn and implement problem-solving and decision-making skills. Target Date: 2022-06-09 Frequency: Biweekly  Progress: 40 Modality: individual  Related Interventions Conduct Problem-Solving Therapy using techniques such as psychoeducation, modeling, and role-playing to teach client problem-solving skills (i.e., defining a problem specifically, generating possible solutions, evaluating the pros and cons of each solution, selecting and implementing a plan of action, evaluating the efficacy of the plan, accepting or revising the plan); role-play application of the problem-solving skill to a real life issue. Encourage in the client the development of a positive problem orientation in which problems and solving them are viewed as a natural part of life and not something to be feared, despaired, or avoided. 3. Develop healthy interpersonal relationships that lead to the alleviation and help prevent the relapse of depression. 4. Develop healthy thinking patterns and beliefs about self, others, and the world that lead to the alleviation and help prevent the relapse of depression. 5. Recognize, accept, and cope with feelings of depression. Diagnosis F33.1  Conditions For Discharge Achievement of treatment goals and objectives   Clint Bolder, LCSW

## 2022-04-13 NOTE — Telephone Encounter (Signed)
Called pt inform her we received her [place] pt assistance from [employer]. [employer] Delivery # [phone]. Put samples up front for pick-up.Marland KitchenJohny Chess

## 2022-04-15 ENCOUNTER — Telehealth: Payer: Self-pay

## 2022-04-16 ENCOUNTER — Ambulatory Visit (INDEPENDENT_AMBULATORY_CARE_PROVIDER_SITE_OTHER): Payer: Medicare Other

## 2022-04-16 VITALS — Ht 60.0 in | Wt 140.0 lb

## 2022-04-16 DIAGNOSIS — Z0000 Encounter for general adult medical examination without abnormal findings: Secondary | ICD-10-CM | POA: Diagnosis not present
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
## 2022-04-16 NOTE — Patient Instructions (Signed)
Veronica [name] , Thank you for taking time to come for your Medicare Wellness Visit. I appreciate your ongoing commitment to your health goals. Please review the following plan we discussed and let me know if I can assist you in the future.   These are the goals we discussed:  Goals      Client understands the importance of follow-up with providers by attending scheduled visits        This is a list of the screening recommended for you and due dates:  Health Maintenance  Topic Date Due   COVID-19 Vaccine (4 - 2023-24 season) 11/13/2021   Zoster (Shingles) Vaccine (1 of 2) 06/03/2022*   Medicare Annual Wellness Visit  04/17/2023   Mammogram  05/13/2023   DTaP/Tdap/Td vaccine (2 - Tdap) 02/12/2024   Colon Cancer Screening  03/22/2024   Pneumonia Vaccine  Completed   Flu Shot  Completed   DEXA scan (bone density measurement)  Completed   Hepatitis C Screening: USPSTF Recommendation to screen - Ages 2-79 yo.  Completed   HPV Vaccine  Aged Out  *Topic was postponed. The date shown is not the original due date.    Advanced directives: Yes  Conditions/risks identified: Yes  Next appointment: Follow up in one year for your annual wellness visit.   Preventive Care 53 Years and Older, Female Preventive care refers to lifestyle choices and visits with your health care provider that can promote health and wellness. What does preventive care include? A yearly physical exam. This is also called an annual well check. Dental exams once or twice a year. Routine eye exams. Ask your health care provider how often you should have your eyes checked. Personal lifestyle choices, including: Daily care of your teeth and gums. Regular physical activity. Eating a healthy diet. Avoiding tobacco and drug use. Limiting alcohol use. Practicing safe sex. Taking low-dose aspirin every day. Taking vitamin and mineral supplements as recommended by your health care provider. What happens during an annual  well check? The services and screenings done by your health care provider during your annual well check will depend on your age, overall health, lifestyle risk factors, and family history of disease. Counseling  Your health care provider may ask you questions about your: Alcohol use. Tobacco use. Drug use. Emotional well-being. Home and relationship well-being. Sexual activity. Eating habits. History of falls. Memory and ability to understand (cognition). Work and work Statistician. Reproductive health. Screening  You may have the following tests or measurements: Height, weight, and BMI. Blood pressure. Lipid and cholesterol levels. These may be checked every 5 years, or more frequently if you are over 51 years old. Skin check. Lung cancer screening. You may have this screening every year starting at age 87 if you have a 30-pack-year history of smoking and currently smoke or have quit within the past 15 years. Fecal occult blood test (FOBT) of the stool. You may have this test every year starting at age 37. Flexible sigmoidoscopy or colonoscopy. You may have a sigmoidoscopy every 5 years or a colonoscopy every 10 years starting at age 48. Hepatitis C blood test. Hepatitis B blood test. Sexually transmitted disease (STD) testing. Diabetes screening. This is done by checking your blood sugar (glucose) after you have not eaten for a while (fasting). You may have this done every 1-3 years. Bone density scan. This is done to screen for osteoporosis. You may have this done starting at age 87. Mammogram. This may be done every 1-2 years. Talk to  your health care provider about how often you should have regular mammograms. Talk with your health care provider about your test results, treatment options, and if necessary, the need for more tests. Vaccines  Your health care provider may recommend certain vaccines, such as: Influenza vaccine. This is recommended every year. Tetanus, diphtheria,  and acellular pertussis (Tdap, Td) vaccine. You may need a Td booster every 10 years. Zoster vaccine. You may need this after age 58. Pneumococcal 13-valent conjugate (PCV13) vaccine. One dose is recommended after age 61. Pneumococcal polysaccharide (PPSV23) vaccine. One dose is recommended after age 61. Talk to your health care provider about which screenings and vaccines you need and how often you need them. This information is not intended to replace advice given to you by your health care provider. Make sure you discuss any questions you have with your health care provider. Document Released: 03/28/2015 Document Revised: 11/19/2015 Document Reviewed: 12/31/2014 Elsevier Interactive Patient Education  2017 [employer] Prevention in the Home Falls can cause injuries. They can happen to people of all ages. There are many things you can do to make your home safe and to help prevent falls. What can I do on the outside of my home? Regularly fix the edges of walkways and driveways and fix any cracks. Remove anything that might make you trip as you walk through a door, such as a raised step or threshold. Trim any bushes or trees on the path to your home. Use bright outdoor lighting. Clear any walking paths of anything that might make someone trip, such as rocks or tools. Regularly check to see if handrails are loose or broken. Make sure that both sides of any steps have handrails. Any raised decks and porches should have guardrails on the edges. Have any leaves, snow, or ice cleared regularly. Use sand or salt on walking paths during winter. Clean up any spills in your garage right away. This includes oil or grease spills. What can I do in the bathroom? Use night lights. Install grab bars by the toilet and in the tub and shower. Do not use towel bars as grab bars. Use non-skid mats or decals in the tub or shower. If you need to sit down in the shower, use a plastic, non-slip  stool. Keep the floor dry. Clean up any water that spills on the floor as soon as it happens. Remove soap buildup in the tub or shower regularly. Attach bath mats securely with double-sided non-slip rug tape. Do not have throw rugs and other things on the floor that can make you trip. What can I do in the bedroom? Use night lights. Make sure that you have a light by your bed that is easy to reach. Do not use any sheets or blankets that are too big for your bed. They should not hang down onto the floor. Have a firm chair that has side arms. You can use this for support while you get dressed. Do not have throw rugs and other things on the floor that can make you trip. What can I do in the kitchen? Clean up any spills right away. Avoid walking on wet floors. Keep items that you use a lot in easy-to-reach places. If you need to reach something above you, use a strong step stool that has a grab bar. Keep electrical cords out of the way. Do not use floor polish or wax that makes floors slippery. If you must use wax, use non-skid floor wax. Do not  have throw rugs and other things on the floor that can make you trip. What can I do with my stairs? Do not leave any items on the stairs. Make sure that there are handrails on both sides of the stairs and use them. Fix handrails that are broken or loose. Make sure that handrails are as long as the stairways. Check any carpeting to make sure that it is firmly attached to the stairs. Fix any carpet that is loose or worn. Avoid having throw rugs at the top or bottom of the stairs. If you do have throw rugs, attach them to the floor with carpet tape. Make sure that you have a light switch at the top of the stairs and the bottom of the stairs. If you do not have them, ask someone to add them for you. What else can I do to help prevent falls? Wear shoes that: Do not have high heels. Have rubber bottoms. Are comfortable and fit you well. Are closed at the  toe. Do not wear sandals. If you use a stepladder: Make sure that it is fully opened. Do not climb a closed stepladder. Make sure that both sides of the stepladder are locked into place. Ask someone to hold it for you, if possible. Clearly mark and make sure that you can see: Any grab bars or handrails. First and last steps. Where the edge of each step is. Use tools that help you move around (mobility aids) if they are needed. These include: Canes. Walkers. Scooters. Crutches. Turn on the lights when you go into a dark area. Replace any light bulbs as soon as they burn out. Set up your furniture so you have a clear path. Avoid moving your furniture around. If any of your floors are uneven, fix them. If there are any pets around you, be aware of where they are. Review your medicines with your doctor. Some medicines can make you feel dizzy. This can increase your chance of falling. Ask your doctor what other things that you can do to help prevent falls. This information is not intended to replace advice given to you by your health care provider. Make sure you discuss any questions you have with your health care provider. Document Released: 12/26/2008 Document Revised: 08/07/2015 Document Reviewed: 04/05/2014 Elsevier Interactive Patient Education  2017 [employer].

## 2022-04-16 NOTE — Telephone Encounter (Signed)
Pt already aware of pt assistant approval. She has pick up first 3 months of meds.Marland KitchenAndee Poles

## 2022-04-26 ENCOUNTER — Ambulatory Visit (INDEPENDENT_AMBULATORY_CARE_PROVIDER_SITE_OTHER): Payer: Medicare Other | Admitting: Psychology

## 2022-04-26 DIAGNOSIS — F331 Major depressive disorder, recurrent, moderate: Secondary | ICD-10-CM | POA: Diagnosis not present

## 2022-04-26 NOTE — Progress Notes (Signed)
[place] Counselor/Therapist Progress Note  Patient ID: Veronica [name], MRN: [id number],    Date: 04/26/2022  Time Spent: 12:00pm - 12:45pm    45 minutes   Treatment Type: Individual Therapy  Reported Symptoms: worrying  Mental Status Exam: Appearance:  Casual     Behavior: Appropriate  Motor: Normal  Speech/Language:  Normal Rate  Affect: Appropriate  Mood: normal  Thought process: normal  Thought content:   WNL  Sensory/Perceptual disturbances:   WNL  Orientation: oriented to person, place, time/date, and situation  Attention: Good  Concentration: Good  Memory: WNL  Fund of knowledge:  Good  Insight:   Good  Judgment:  Good  Impulse Control: Good   Risk Assessment: Danger to Self:  No Self-injurious Behavior: No Danger to Others: No Duty to Warn:no Physical Aggression / Violence:No  Access to Firearms a concern: No  Gang Involvement:No   Subjective: Pt present for individual therapy via  phone.  Pt consents to telehealth session due to [place] 19 pandemic. Location of pt: home Location of therapist: home office.  Pt talked about her health.  Pt saw the eye doctor and was told she has macular degeneration.   Pt was started on medication.   Addressed pt's feelings about the diagnosis.  She is worried but is trying to be positive.   Addressed pt's worries and worked on thought reframing.   Pt talked about concerns about her brother who is having health issues.   Worked on additional coping and self care strategies.   Provided supportive therapy.    Interventions: Cognitive Behavioral Therapy and Insight-Oriented  Diagnosis:  F33.1  Plan of Care: Recommend ongoing therapy.   Pt participated in setting therapy goals.  Pt wants to have someone to talk to and improve coping skills.   Plan to continue to meet every two weeks.   Pt is progressing toward treatment goals.   Treatment Plan (Treatment Plan Target Date: 06/09/2022) Client  Abilities/Strengths  Pt is bright, engaging, and motivated for therapy.   Client Treatment Preferences  Individual therapy.  Client Statement of Needs  Improve coping skills.  Symptoms  Depressed or irritable mood. Lack of energy. Feelings of hopelessness, worthlessness, or inappropriate guilt. Low self-esteem. Unresolved grief issues.   Problems Addressed  Unipolar Depression Goals 1. Alleviate depressive symptoms and return to previous level of effective functioning. 2. Appropriately grieve the loss in order to normalize mood and to return to previously adaptive level of functioning. Objective Learn and implement behavioral strategies to overcome depression. Target Date: 2022-06-09 Frequency: Biweekly  Progress: 40 Modality: individual  Related Interventions Engage the client in "behavioral activation," increasing his/her activity level and contact with sources of reward, while identifying processes that inhibit activation.  Use behavioral techniques such as instruction, rehearsal, role-playing, role reversal, as needed, to facilitate activity in the client's daily life; reinforce success. Assist the client in developing skills that increase the likelihood of deriving pleasure from behavioral activation (e.g., assertiveness skills, developing an exercise plan, less internal/more external focus, increased social involvement); reinforce success. Objective Identify important people in life, past and present, and describe the quality, good and poor, of those relationships. Target Date: 2022-06-09 Frequency: Biweekly  Progress: 40 Modality: individual  Related Interventions Conduct Interpersonal Therapy beginning with the assessment of the client's "interpersonal inventory" of important past and present relationships; develop a case formulation linking depression to grief, interpersonal role disputes, role transitions, and/or interpersonal deficits). Objective Learn and implement  problem-solving and decision-making skills. Target Date: 2022-06-09 Frequency: Biweekly  Progress: 40 Modality: individual  Related Interventions Conduct Problem-Solving Therapy using techniques such as psychoeducation, modeling, and role-playing to teach client problem-solving skills (i.e., defining a problem specifically, generating possible solutions, evaluating the pros and cons of each solution, selecting and implementing a plan of action, evaluating the efficacy of the plan, accepting or revising the plan); role-play application of the problem-solving skill to a real life issue. Encourage in the client the development of a positive problem orientation in which problems and solving them are viewed as a natural part of life and not something to be feared, despaired, or avoided. 3. Develop healthy interpersonal relationships that lead to the alleviation and help prevent the relapse of depression. 4. Develop healthy thinking patterns and beliefs about self, others, and the world that lead to the alleviation and help prevent the relapse of depression. 5. Recognize, accept, and cope with feelings of depression. Diagnosis F33.1  Conditions For Discharge Achievement of treatment goals and objectives   Clint Bolder, LCSW

## 2022-05-02 ENCOUNTER — Other Ambulatory Visit: Payer: Self-pay | Admitting: Internal Medicine

## 2022-05-04 ENCOUNTER — Encounter: Payer: Self-pay | Admitting: Internal Medicine

## 2022-05-04 ENCOUNTER — Ambulatory Visit (INDEPENDENT_AMBULATORY_CARE_PROVIDER_SITE_OTHER): Payer: Medicare Other | Admitting: Internal Medicine

## 2022-05-04 VITALS — BP 122/76 | HR 75 | Temp 97.8°F | Ht 60.0 in | Wt 137.0 lb

## 2022-05-04 DIAGNOSIS — I10 Essential (primary) hypertension: Secondary | ICD-10-CM | POA: Diagnosis not present

## 2022-05-04 DIAGNOSIS — I25111 Atherosclerotic heart disease of native coronary artery with angina pectoris with documented spasm: Secondary | ICD-10-CM | POA: Diagnosis not present

## 2022-05-04 DIAGNOSIS — J479 Bronchiectasis, uncomplicated: Secondary | ICD-10-CM | POA: Diagnosis not present

## 2022-05-04 DIAGNOSIS — G40909 Epilepsy, unspecified, not intractable, without status epilepticus: Secondary | ICD-10-CM | POA: Diagnosis not present

## 2022-05-04 MED ORDER — CLONAZEPAM 1 MG PO TABS: ORAL_TABLET | ORAL | 1 refills | Status: DC

## 2022-05-04 NOTE — Progress Notes (Signed)
Subjective:  Patient ID: Veronica [name], female    DOB: Jan 10, 1951  Age: 72 y.o. MRN: [id number]  CC: Follow-up   HPI Veronica [name] presents for diarrhea, rash f/u - resolved F/u seizure disorder, OA,     Outpatient Medications Prior to Visit  Medication Sig Dispense Refill   acetaminophen-codeine (TYLENOL #3) 300-30 MG tablet Take 1 tablet by mouth every 6 (six) hours as needed. 60 tablet 3   albuterol (PROVENTIL) (2.5 MG/3ML) 0.083% nebulizer solution USE 1 VIAL IN NEBULIZER 2 TIMES DAILY. Generic: VENTOLIN 180 mL 3   albuterol (VENTOLIN HFA) 108 (90 Base) MCG/ACT inhaler Inhale 2 puffs into the lungs every 4 (four) hours as needed for wheezing or shortness of breath. 18 g 11   aspirin 81 MG chewable tablet Chew by mouth daily.     atorvastatin (LIPITOR) 40 MG tablet TAKE 1 TABLET BY MOUTH EVERY DAY 90 tablet 1   budesonide (PULMICORT) 0.5 MG/2ML nebulizer solution Take 2 mLs (0.5 mg total) by nebulization in the morning and at bedtime. 120 mL 11   calcium carbonate (OSCAL) 1500 (600 Ca) MG TABS tablet Take 600 mg of elemental calcium by mouth 2 (two) times daily with a meal.      cariprazine (VRAYLAR) 1.5 MG capsule Take 1 capsule (1.5 mg total) by mouth daily. [employer] Asst @ [phone] 90 capsule 3   chlorhexidine (PERIDEX) 0.12 % solution Use to wash mouth twice a day     Cholecalciferol (VITAMIN D3) 50 MCG (2000 UT) capsule Take 1 capsule (2,000 Units total) by mouth daily. 100 capsule 3   cyanocobalamin (VITAMIN B12) 1000 MCG/ML injection INJECT 1ML IN THE MUSCLE EVERY 14 DAYS. 10 mL 5   diphenoxylate-atropine (LOMOTIL) 2.5-0.025 MG tablet Take 1 tablet by mouth 4 (four) times daily as needed for diarrhea or loose stools. 60 tablet 2   Docusate Calcium (STOOL SOFTENER PO) Take 100 mg by mouth as needed (for constipation).     escitalopram (LEXAPRO) 20 MG tablet TAKE 1 TABLET(20 MG) BY MOUTH DAILY 90 tablet 3   ezetimibe (ZETIA) 10 MG tablet Take 1 tablet (10 mg total) by  mouth daily. 90 tablet 3   furosemide (LASIX) 20 MG tablet TAKE 1 TABLET(20 MG) BY MOUTH DAILY AS NEEDED 90 tablet 1   Insulin Syringe-Needle U-100 (B-D INSULIN SYRINGE 1CC/25GX1") 25G X 1" 1 ML MISC USE AS DIRECTED EVERY 2 WEEKS 50 each 2   isosorbide mononitrate (IMDUR) 30 MG 24 hr tablet Take 1 tablet (30 mg total) by mouth daily. 90 tablet 3   ketoconazole (NIZORAL) 2 % cream Apply 1 Application topically daily. 60 g 1   levothyroxine (SYNTHROID) 88 MCG tablet TAKE 1 TABLET(88 MCG) BY MOUTH DAILY 90 tablet 3   metoprolol tartrate (LOPRESSOR) 50 MG tablet TAKE 1 TABLET 2 HOURS PRIOR TO TEST 1 tablet 0   Multiple Vitamins-Minerals (PRESERVISION AREDS 2 PO) Take by mouth.     nitroGLYCERIN (NITROSTAT) 0.4 MG SL tablet DISSOLVE 1 TABLET UNDER THE TONGUE AS DIRECTED. MAX 3 DOSES. CALL 911 IF NEEDING ALL 3 DOSES 25 tablet 2   nystatin (MYCOSTATIN) 100000 UNIT/ML suspension Take 5 mLs (500,000 Units total) by mouth 4 (four) times daily. 60 mL 0   Omega-3 Fatty Acids (FISH OIL) 1000 MG CAPS Take 1 capsule by mouth daily.     ondansetron (ZOFRAN) 4 MG tablet Take 1 tablet (4 mg total) by mouth every 8 (eight) hours as needed for nausea or vomiting. 30 tablet 2  phenytoin (DILANTIN) 100 MG ER capsule TAKE 2 CAPSULES BY MOUTH EVERY MORNING AND 1 CAPSULE BY MOUTH EVERY EVENING 270 capsule 3   phenytoin (DILANTIN) 100 MG ER capsule TAKE 2 CAPSULES BY MOUTH EVERY MORNING AND 1 CAPSULE BY MOUTH EVERY EVENING 270 capsule 3   promethazine (PHENERGAN) 12.5 MG tablet Take by mouth.     promethazine-dextromethorphan (PROMETHAZINE-DM) 6.25-15 MG/5ML syrup Take 5 mLs by mouth 4 (four) times daily as needed for cough. 240 mL 1   Respiratory Therapy Supplies (FLUTTER) DEVI Use after nebulization treatments 1 each 0   sodium chloride (MURO 128) 5 % ophthalmic solution Place 1 drop into both eyes as needed for irritation.     SYRINGE-NEEDLE, DISP, 3 ML (BD ECLIPSE SYRINGE) 25G X 1" 3 ML MISC Use sq q 2 wks 50 each 3    traZODone (DESYREL) 50 MG tablet TAKE 4 TABLETS(200 MG) BY MOUTH AT BEDTIME 360 tablet 3   verapamil (CALAN-SR) 240 MG CR tablet TAKE 1 TABLET(240 MG) BY MOUTH AT BEDTIME 90 tablet 2   clonazePAM (KLONOPIN) 1 MG tablet TAKE 2 TABLETS BY MOUTH EVERY NIGHT AT BEDTIME AND 1/2 TABLET EXTRA IN DAYTIME AS NEEDED 225 tablet 1   No facility-administered medications prior to visit.    ROS: Review of Systems  Constitutional:  Negative for activity change, appetite change, chills, fatigue and unexpected weight change.  HENT:  Negative for congestion, mouth sores and sinus pressure.   Eyes:  Negative for visual disturbance.  Respiratory:  Positive for cough. Negative for chest tightness.   Gastrointestinal:  Negative for abdominal pain and nausea.  Genitourinary:  Negative for difficulty urinating, frequency and vaginal pain.  Musculoskeletal:  Positive for back pain and gait problem.  Skin:  Negative for pallor and rash.  Neurological:  Negative for dizziness, tremors, weakness, numbness and headaches.  Psychiatric/Behavioral:  Negative for confusion, sleep disturbance and suicidal ideas.     Objective:  BP 122/76 (BP Location: Left Arm, Patient Position: Sitting, Cuff Size: Normal)   Pulse 75   Temp 97.8 F (36.6 C) (Oral)   Ht 5' (1.524 m)   Wt 137 lb (62.1 kg)   SpO2 98%   BMI 26.76 kg/m   BP Readings from Last 3 Encounters:  05/04/22 122/76  03/12/22 120/72  03/09/22 110/68    Wt Readings from Last 3 Encounters:  05/04/22 137 lb (62.1 kg)  04/16/22 140 lb (63.5 kg)  03/12/22 140 lb (63.5 kg)    Physical Exam Constitutional:      General: She is not in acute distress.    Appearance: Normal appearance. She is well-developed.  HENT:     Head: Normocephalic.     Right Ear: External ear normal.     Left Ear: External ear normal.     Nose: Nose normal.  Eyes:     General:        Right eye: No discharge.        Left eye: No discharge.     Conjunctiva/sclera: Conjunctivae  normal.     Pupils: Pupils are equal, round, and reactive to light.  Neck:     Thyroid: No thyromegaly.     Vascular: No JVD.     Trachea: No tracheal deviation.  Cardiovascular:     Rate and Rhythm: Normal rate and regular rhythm.     Heart sounds: Normal heart sounds.  Pulmonary:     Effort: No respiratory distress.     Breath sounds: No stridor. No wheezing.  Abdominal:     General: Bowel sounds are normal. There is no distension.     Palpations: Abdomen is soft. There is no mass.     Tenderness: There is no abdominal tenderness. There is no guarding or rebound.  Musculoskeletal:        General: No tenderness.     Cervical back: Normal range of motion and neck supple. No rigidity.  Lymphadenopathy:     Cervical: No cervical adenopathy.  Skin:    Findings: No erythema or rash.  Neurological:     Cranial Nerves: No cranial nerve deficit.     Motor: No abnormal muscle tone.     Coordination: Coordination normal.     Deep Tendon Reflexes: Reflexes normal.  Psychiatric:        Behavior: Behavior normal.        Thought Content: Thought content normal.        Judgment: Judgment normal.   Using a cane  Lab Results  Component Value Date   WBC 11.0 (H) 03/04/2022   HGB 14.3 03/04/2022   HCT 42.7 03/04/2022   PLT 396.0 03/04/2022   GLUCOSE 87 03/04/2022   CHOL 196 11/03/2021   TRIG 119 11/03/2021   HDL 63 11/03/2021   LDLDIRECT 165.0 09/26/2009   LDLCALC 112 (H) 11/03/2021   ALT 13 03/04/2022   AST 24 03/04/2022   NA 141 03/04/2022   K 3.4 (L) 03/04/2022   CL 97 03/04/2022   CREATININE 0.64 03/04/2022   BUN 8 03/04/2022   CO2 33 (H) 03/04/2022   TSH 2.75 06/10/2021   INR 1.0 12/18/2021   HGBA1C 5.7 06/10/2021    MR Abdomen W Wo Contrast  Result Date: 12/29/2021 CLINICAL DATA:  Possible hepatic cirrhosis and indeterminate liver lesion on recent noncontrast chest CT. EXAM: MRI ABDOMEN WITHOUT AND WITH CONTRAST TECHNIQUE: Multiplanar multisequence MR imaging of the  abdomen was performed both before and after the administration of intravenous contrast. CONTRAST:  29m GADAVIST GADOBUTROL 1 MMOL/ML IV SOLN COMPARISON:  Noncontrast chest CT on 09/14/2021 FINDINGS: Lower chest: No acute findings. Hepatobiliary: A few tiny sub-cm hepatic cysts are seen in the right and left hepatic lobes. No hepatic masses identified. No definite gross morphologic findings of cirrhosis identified. Prior cholecystectomy. No evidence of biliary obstruction. Pancreas:  No mass or inflammatory changes. Spleen:  Within normal limits in size and appearance. Adrenals/Urinary Tract: No suspicious masses identified. No evidence of hydronephrosis. Stomach/Bowel: Unremarkable. Vascular/Lymphatic: No pathologically enlarged lymph nodes identified. No acute vascular findings. Other:  None. Musculoskeletal:  No suspicious bone lesions identified. IMPRESSION: Several tiny sub-cm hepatic cysts. No evidence of hepatic neoplasm, or definite imaging signs of cirrhosis. Electronically Signed   By: JMarlaine HindM.D.   On: 12/29/2021 15:42    Assessment & Plan:   Problem List Items Addressed This Visit       Cardiovascular and Mediastinum   Essential hypertension    Chronic  Cont on Calan SR      Coronary artery disease     Cont on Atorvastatin. Zetia caused diarrhea        Respiratory   Bronchiectasis (HCC) - Primary    Chronic cough Prom cough syrup        Nervous and Auditory   Epilepsy (HCC)    Chronic Cont on Dilantin      Relevant Medications   clonazePAM (KLONOPIN) 1 MG tablet      Meds ordered this encounter  Medications   clonazePAM (KLONOPIN) 1 MG  tablet    Sig: TAKE 2 TABLETS BY MOUTH EVERY NIGHT AT BEDTIME AND 1/2 TABLET EXTRA IN DAYTIME AS NEEDED    Dispense:  225 tablet    Refill:  1      Follow-up: Return in about 3 months (around 08/02/2022) for a follow-up visit.  Walker Kehr, MD

## 2022-05-04 NOTE — Assessment & Plan Note (Signed)
Chronic  Cont on Calan SR

## 2022-05-04 NOTE — Assessment & Plan Note (Signed)
Cont on Atorvastatin. Zetia caused diarrhea

## 2022-05-04 NOTE — Assessment & Plan Note (Signed)
Chronic Cont on Dilantin

## 2022-05-04 NOTE — Assessment & Plan Note (Signed)
Chronic cough Prom cough syrup

## 2022-05-06 ENCOUNTER — Telehealth: Payer: Self-pay | Admitting: Internal Medicine

## 2022-05-06 NOTE — Telephone Encounter (Signed)
Pt called requesting for rx [id number] (PROMETHAZINE-DM) 6.25-15 MG/5ML syrup   Last OV: 05/04/22  Upcoming app 08/03/22  Preferred Pharmacy:  [facility], [place]

## 2022-05-10 ENCOUNTER — Ambulatory Visit (INDEPENDENT_AMBULATORY_CARE_PROVIDER_SITE_OTHER): Payer: Medicare Other | Admitting: Psychology

## 2022-05-10 DIAGNOSIS — F331 Major depressive disorder, recurrent, moderate: Secondary | ICD-10-CM

## 2022-05-10 MED ORDER — PROMETHAZINE-DM 6.25-15 MG/5ML PO SYRP: 5.0000 mL | ORAL_SOLUTION | Freq: Four times a day (QID) | ORAL | 1 refills | Status: DC | PRN

## 2022-05-10 NOTE — Progress Notes (Signed)
[facility] Counselor/Therapist Progress Note  Patient ID: Veronica [name], MRN: [id number],    Date: 05/10/2022  Time Spent: 12:00pm - 12:45pm    45 minutes   Treatment Type: Individual Therapy  Reported Symptoms: worrying  Mental Status Exam: Appearance:  Casual     Behavior: Appropriate  Motor: Normal  Speech/Language:  Normal Rate  Affect: Appropriate  Mood: normal  Thought process: normal  Thought content:   WNL  Sensory/Perceptual disturbances:   WNL  Orientation: oriented to person, place, time/date, and situation  Attention: Good  Concentration: Good  Memory: WNL  Fund of knowledge:  Good  Insight:   Good  Judgment:  Good  Impulse Control: Good   Risk Assessment: Danger to Self:  No Self-injurious Behavior: No Danger to Others: No Duty to Warn:no Physical Aggression / Violence:No  Access to Firearms a concern: No  Gang Involvement:No   Subjective: Pt present for individual therapy via  phone.  Pt consents to telehealth session due to [place] 19 pandemic. Location of pt: home Location of therapist: home office.  Pt talked about her health.  Pt is still dealing with dental issues but is pleased she was able to eat a breakfast biscuit for the first time since her surgeries.  Pt talked about concerns about her brother who is having health issues.   Pt states she worries about things and has trouble getting them out of her mind.   Addressed pt's worries and worked on thought reframing.   Worked on additional coping and self care strategies.   Provided supportive therapy.    Interventions: Cognitive Behavioral Therapy and Insight-Oriented  Diagnosis:  F33.1  Plan of Care: Recommend ongoing therapy.   Pt participated in setting therapy goals.  Pt wants to have someone to talk to and improve coping skills.   Plan to continue to meet every two weeks.   Pt is progressing toward treatment goals.   Treatment Plan (Treatment Plan Target Date:  06/09/2022) Client Abilities/Strengths  Pt is bright, engaging, and motivated for therapy.   Client Treatment Preferences  Individual therapy.  Client Statement of Needs  Improve coping skills.  Symptoms  Depressed or irritable mood. Lack of energy. Feelings of hopelessness, worthlessness, or inappropriate guilt. Low self-esteem. Unresolved grief issues.   Problems Addressed  Unipolar Depression Goals 1. Alleviate depressive symptoms and return to previous level of effective functioning. 2. Appropriately grieve the loss in order to normalize mood and to return to previously adaptive level of functioning. Objective Learn and implement behavioral strategies to overcome depression. Target Date: 2022-06-09 Frequency: Biweekly  Progress: 40 Modality: individual  Related Interventions Engage the client in "behavioral activation," increasing his/her activity level and contact with sources of reward, while identifying processes that inhibit activation.  Use behavioral techniques such as instruction, rehearsal, role-playing, role reversal, as needed, to facilitate activity in the client's daily life; reinforce success. Assist the client in developing skills that increase the likelihood of deriving pleasure from behavioral activation (e.g., assertiveness skills, developing an exercise plan, less internal/more external focus, increased social involvement); reinforce success. Objective Identify important people in life, past and present, and describe the quality, good and poor, of those relationships. Target Date: 2022-06-09 Frequency: Biweekly  Progress: 40 Modality: individual  Related Interventions Conduct Interpersonal Therapy beginning with the assessment of the client's "interpersonal inventory" of important past and present relationships; develop a case formulation linking depression to grief, interpersonal role disputes, role transitions, and/or interpersonal deficits). Objective  Learn  and implement problem-solving and decision-making skills. Target Date: 2022-06-09 Frequency: Biweekly  Progress: 40 Modality: individual  Related Interventions Conduct Problem-Solving Therapy using techniques such as psychoeducation, modeling, and role-playing to teach client problem-solving skills (i.e., defining a problem specifically, generating possible solutions, evaluating the pros and cons of each solution, selecting and implementing a plan of action, evaluating the efficacy of the plan, accepting or revising the plan); role-play application of the problem-solving skill to a real life issue. Encourage in the client the development of a positive problem orientation in which problems and solving them are viewed as a natural part of life and not something to be feared, despaired, or avoided. 3. Develop healthy interpersonal relationships that lead to the alleviation and help prevent the relapse of depression. 4. Develop healthy thinking patterns and beliefs about self, others, and the world that lead to the alleviation and help prevent the relapse of depression. 5. Recognize, accept, and cope with feelings of depression. Diagnosis F33.1  Conditions For Discharge Achievement of treatment goals and objectives   Clint Bolder, LCSW

## 2022-05-10 NOTE — Telephone Encounter (Signed)
Okay.  Thanks.

## 2022-05-15 ENCOUNTER — Other Ambulatory Visit: Payer: Self-pay | Admitting: Internal Medicine

## 2022-05-24 ENCOUNTER — Ambulatory Visit (INDEPENDENT_AMBULATORY_CARE_PROVIDER_SITE_OTHER): Payer: Medicare Other | Admitting: Psychology

## 2022-05-24 DIAGNOSIS — F331 Major depressive disorder, recurrent, moderate: Secondary | ICD-10-CM | POA: Diagnosis not present

## 2022-05-24 NOTE — Progress Notes (Signed)
[facility] Counselor/Therapist Progress Note  Patient ID: Veronica [name], MRN: [id number],    Date: 05/24/2022  Time Spent: 12:00pm - 12:45pm    45 minutes   Treatment Type: Individual Therapy  Reported Symptoms: worrying  Mental Status Exam: Appearance:  Casual     Behavior: Appropriate  Motor: Normal  Speech/Language:  Normal Rate  Affect: Appropriate  Mood: normal  Thought process: normal  Thought content:   WNL  Sensory/Perceptual disturbances:   WNL  Orientation: oriented to person, place, time/date, and situation  Attention: Good  Concentration: Good  Memory: WNL  Fund of knowledge:  Good  Insight:   Good  Judgment:  Good  Impulse Control: Good   Risk Assessment: Danger to Self:  No Self-injurious Behavior: No Danger to Others: No Duty to Warn:no Physical Aggression / Violence:No  Access to Firearms a concern: No  Gang Involvement:No   Subjective: Pt present for individual therapy via  phone.  Pt consents to telehealth session due to [place] 19 pandemic. Location of pt: home Location of therapist: home office.  Pt talked about her health.   She had a fall and hurt her back and hip.  She did not need to go to the hospital but is sore.   Pt is getting out to be around people so she won't feel so lonely.   She has connected with a neighbor but states she has to "keep her distance" bc of the dynamics.  Helped pt process her feelings and relationship dynamics.  Pt states she worries about things and has trouble getting them out of her mind.   Addressed pt's worries and worked on thought reframing.   Worked on additional coping and self care strategies.   Provided supportive therapy.    Interventions: Cognitive Behavioral Therapy and Insight-Oriented  Diagnosis:  F33.1  Plan of Care: Recommend ongoing therapy.   Pt participated in setting therapy goals.  Pt wants to have someone to talk to and improve coping skills.   Plan to continue to  meet every two weeks.   Pt is progressing toward treatment goals.   Treatment Plan (Treatment Plan Target Date: 06/09/2022) Client Abilities/Strengths  Pt is bright, engaging, and motivated for therapy.   Client Treatment Preferences  Individual therapy.  Client Statement of Needs  Improve coping skills.  Symptoms  Depressed or irritable mood. Lack of energy. Feelings of hopelessness, worthlessness, or inappropriate guilt. Low self-esteem. Unresolved grief issues.   Problems Addressed  Unipolar Depression Goals 1. Alleviate depressive symptoms and return to previous level of effective functioning. 2. Appropriately grieve the loss in order to normalize mood and to return to previously adaptive level of functioning. Objective Learn and implement behavioral strategies to overcome depression. Target Date: 2022-06-09 Frequency: Biweekly  Progress: 40 Modality: individual  Related Interventions Engage the client in "behavioral activation," increasing his/her activity level and contact with sources of reward, while identifying processes that inhibit activation.  Use behavioral techniques such as instruction, rehearsal, role-playing, role reversal, as needed, to facilitate activity in the client's daily life; reinforce success. Assist the client in developing skills that increase the likelihood of deriving pleasure from behavioral activation (e.g., assertiveness skills, developing an exercise plan, less internal/more external focus, increased social involvement); reinforce success. Objective Identify important people in life, past and present, and describe the quality, good and poor, of those relationships. Target Date: 2022-06-09 Frequency: Biweekly  Progress: 40 Modality: individual  Related Interventions Conduct Interpersonal Therapy beginning with  the assessment of the client's "interpersonal inventory" of important past and present relationships; develop a case formulation linking  depression to grief, interpersonal role disputes, role transitions, and/or interpersonal deficits). Objective Learn and implement problem-solving and decision-making skills. Target Date: 2022-06-09 Frequency: Biweekly  Progress: 40 Modality: individual  Related Interventions Conduct Problem-Solving Therapy using techniques such as psychoeducation, modeling, and role-playing to teach client problem-solving skills (i.e., defining a problem specifically, generating possible solutions, evaluating the pros and cons of each solution, selecting and implementing a plan of action, evaluating the efficacy of the plan, accepting or revising the plan); role-play application of the problem-solving skill to a real life issue. Encourage in the client the development of a positive problem orientation in which problems and solving them are viewed as a natural part of life and not something to be feared, despaired, or avoided. 3. Develop healthy interpersonal relationships that lead to the alleviation and help prevent the relapse of depression. 4. Develop healthy thinking patterns and beliefs about self, others, and the world that lead to the alleviation and help prevent the relapse of depression. 5. Recognize, accept, and cope with feelings of depression. Diagnosis F33.1  Conditions For Discharge Achievement of treatment goals and objectives   Clint Bolder, LCSW

## 2022-06-07 ENCOUNTER — Ambulatory Visit (INDEPENDENT_AMBULATORY_CARE_PROVIDER_SITE_OTHER): Payer: Medicare Other | Admitting: Psychology

## 2022-06-07 DIAGNOSIS — F331 Major depressive disorder, recurrent, moderate: Secondary | ICD-10-CM | POA: Diagnosis not present

## 2022-06-07 NOTE — Progress Notes (Signed)
[facility] Counselor/Therapist Progress Note  Patient ID: Veronica [name], MRN: [id number],    Date: 06/07/2022  Time Spent: 12:00pm - 12:45pm    45 minutes   Treatment Type: Individual Therapy  Reported Symptoms: worrying  Mental Status Exam: Appearance:  Casual     Behavior: Appropriate  Motor: Normal  Speech/Language:  Normal Rate  Affect: Appropriate  Mood: normal  Thought process: normal  Thought content:   WNL  Sensory/Perceptual disturbances:   WNL  Orientation: oriented to person, place, time/date, and situation  Attention: Good  Concentration: Good  Memory: WNL  Fund of knowledge:  Good  Insight:   Good  Judgment:  Good  Impulse Control: Good   Risk Assessment: Danger to Self:  No Self-injurious Behavior: No Danger to Others: No Duty to Warn:no Physical Aggression / Violence:No  Access to Firearms a concern: No  Gang Involvement:No   Subjective: Pt present for individual therapy via video.  Pt consents to telehealth session due to [place] 19 pandemic. Location of pt: home Location of therapist: home office.  Pt talked about her health.  She is struggling with allergy season and is very congested and coughing.  She worries about getting bronchitis which often occurs.   Pt states she worries about things and has trouble getting them out of her mind.   Addressed pt's worries and worked on thought reframing.   Worked on additional coping and self care strategies.   Provided supportive therapy.    Interventions: Cognitive Behavioral Therapy and Insight-Oriented  Diagnosis:  F33.1  Plan of Care: Recommend ongoing therapy.   Pt participated in setting therapy goals.  Pt wants to have someone to talk to and improve coping skills.   Plan to continue to meet every two weeks.   Pt is progressing toward treatment goals.   Treatment Plan (Treatment Plan Target Date: 06/09/2022) Client Abilities/Strengths  Pt is bright, engaging, and motivated  for therapy.   Client Treatment Preferences  Individual therapy.  Client Statement of Needs  Improve coping skills.  Symptoms  Depressed or irritable mood. Lack of energy. Feelings of hopelessness, worthlessness, or inappropriate guilt. Low self-esteem. Unresolved grief issues.   Problems Addressed  Unipolar Depression Goals 1. Alleviate depressive symptoms and return to previous level of effective functioning. 2. Appropriately grieve the loss in order to normalize mood and to return to previously adaptive level of functioning. Objective Learn and implement behavioral strategies to overcome depression. Target Date: 2022-06-09 Frequency: Biweekly  Progress: 40 Modality: individual  Related Interventions Engage the client in "behavioral activation," increasing his/her activity level and contact with sources of reward, while identifying processes that inhibit activation.  Use behavioral techniques such as instruction, rehearsal, role-playing, role reversal, as needed, to facilitate activity in the client's daily life; reinforce success. Assist the client in developing skills that increase the likelihood of deriving pleasure from behavioral activation (e.g., assertiveness skills, developing an exercise plan, less internal/more external focus, increased social involvement); reinforce success. Objective Identify important people in life, past and present, and describe the quality, good and poor, of those relationships. Target Date: 2022-06-09 Frequency: Biweekly  Progress: 40 Modality: individual  Related Interventions Conduct Interpersonal Therapy beginning with the assessment of the client's "interpersonal inventory" of important past and present relationships; develop a case formulation linking depression to grief, interpersonal role disputes, role transitions, and/or interpersonal deficits). Objective Learn and implement problem-solving and decision-making skills. Target Date:  2022-06-09 Frequency: Biweekly  Progress: 40 Modality: individual  Related Interventions Conduct Problem-Solving Therapy using techniques such as psychoeducation, modeling, and role-playing to teach client problem-solving skills (i.e., defining a problem specifically, generating possible solutions, evaluating the pros and cons of each solution, selecting and implementing a plan of action, evaluating the efficacy of the plan, accepting or revising the plan); role-play application of the problem-solving skill to a real life issue. Encourage in the client the development of a positive problem orientation in which problems and solving them are viewed as a natural part of life and not something to be feared, despaired, or avoided. 3. Develop healthy interpersonal relationships that lead to the alleviation and help prevent the relapse of depression. 4. Develop healthy thinking patterns and beliefs about self, others, and the world that lead to the alleviation and help prevent the relapse of depression. 5. Recognize, accept, and cope with feelings of depression. Diagnosis F33.1  Conditions For Discharge Achievement of treatment goals and objectives   Clint Bolder, LCSW

## 2022-06-16 IMAGING — MG MM DIGITAL SCREENING BILAT W/ TOMO AND CAD
6 of 10 series · 6 of 30 positions shown · non-contrast
Comparison: Previous exam(s).

CLINICAL DATA: Screening.

EXAM:
DIGITAL SCREENING BILATERAL MAMMOGRAM WITH TOMOSYNTHESIS AND CAD
TECHNIQUE: Bilateral screening digital craniocaudal and mediolateral oblique
mammograms were obtained. Bilateral screening digital breast
tomosynthesis was performed. The images were evaluated with
computer-aided detection.

[R MLO synth-2D]
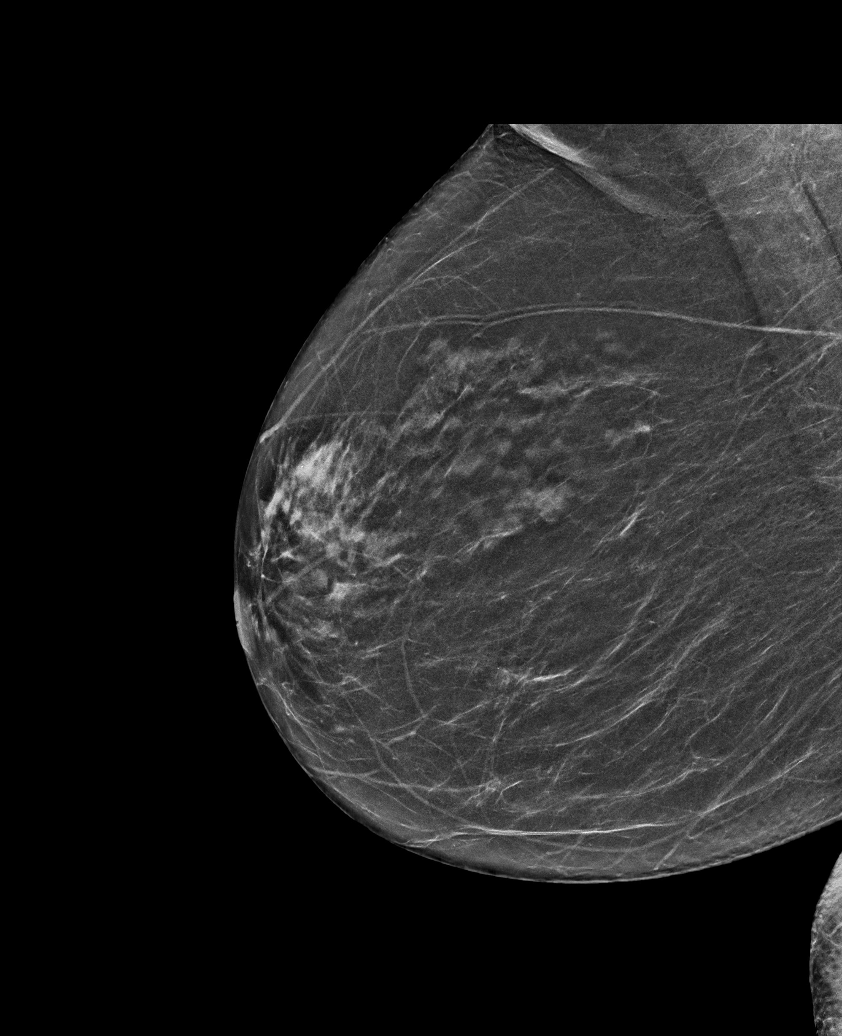

[L MLO synth-2D]
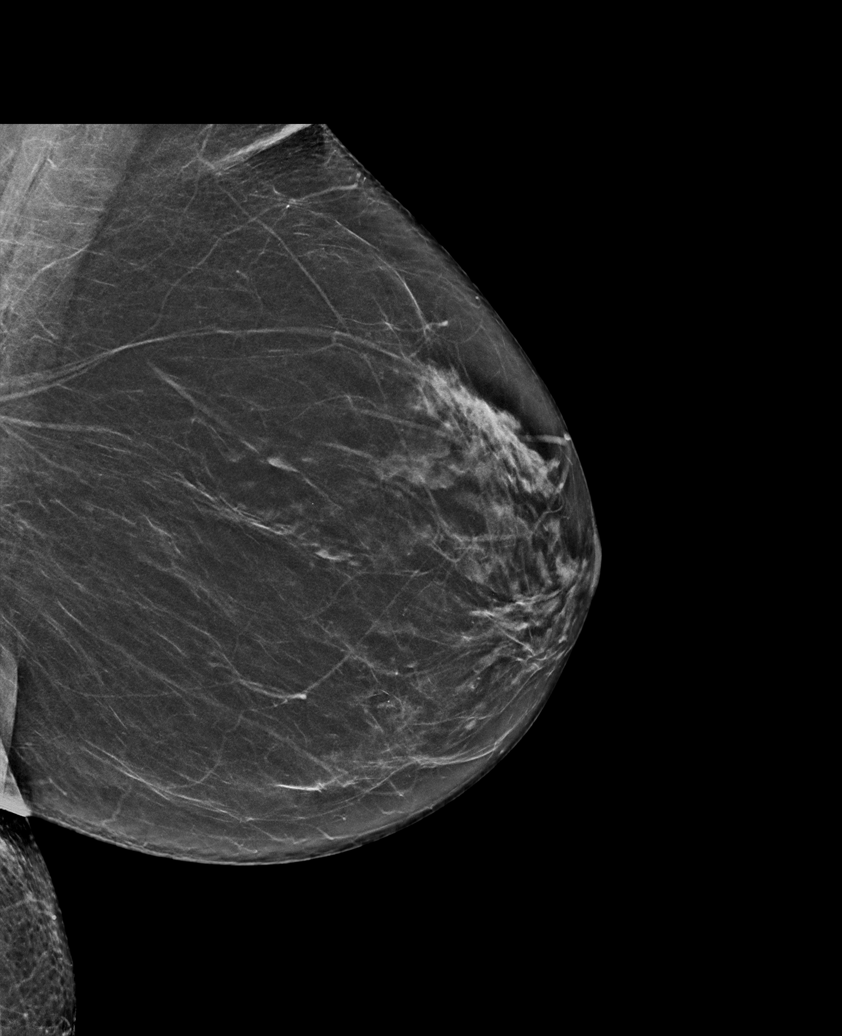

[L CC synth-2D (1 of 2)]
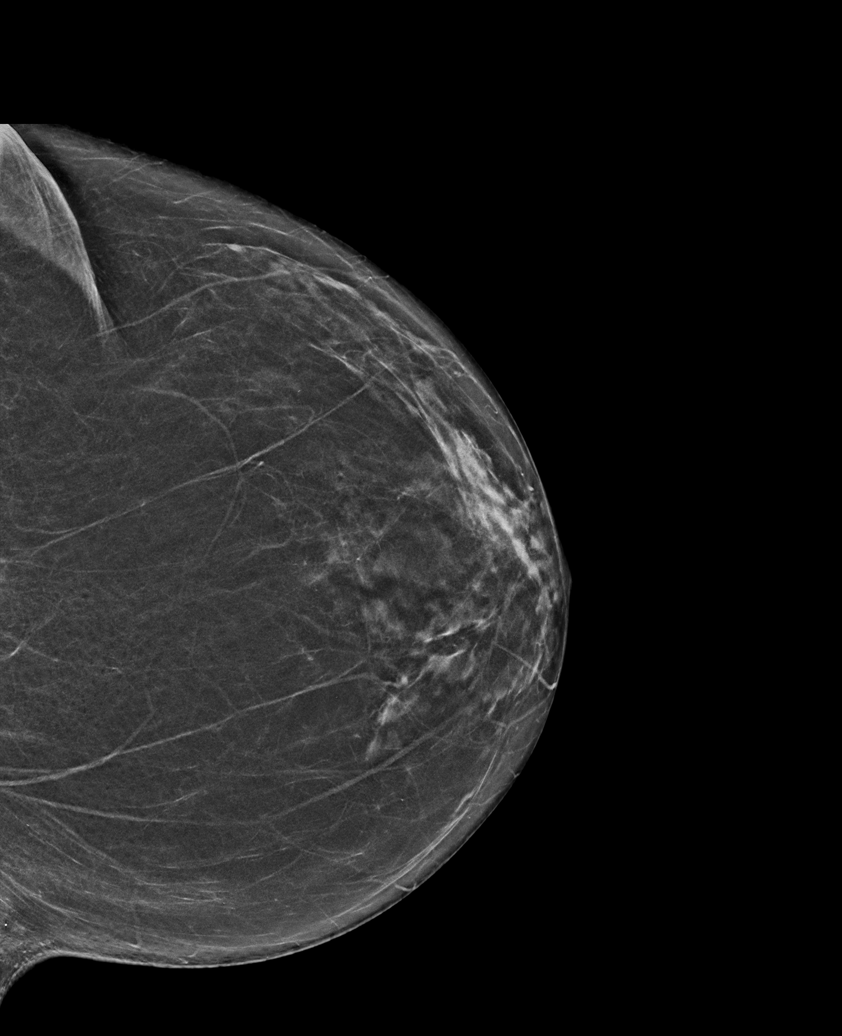

[R CC synth-2D]
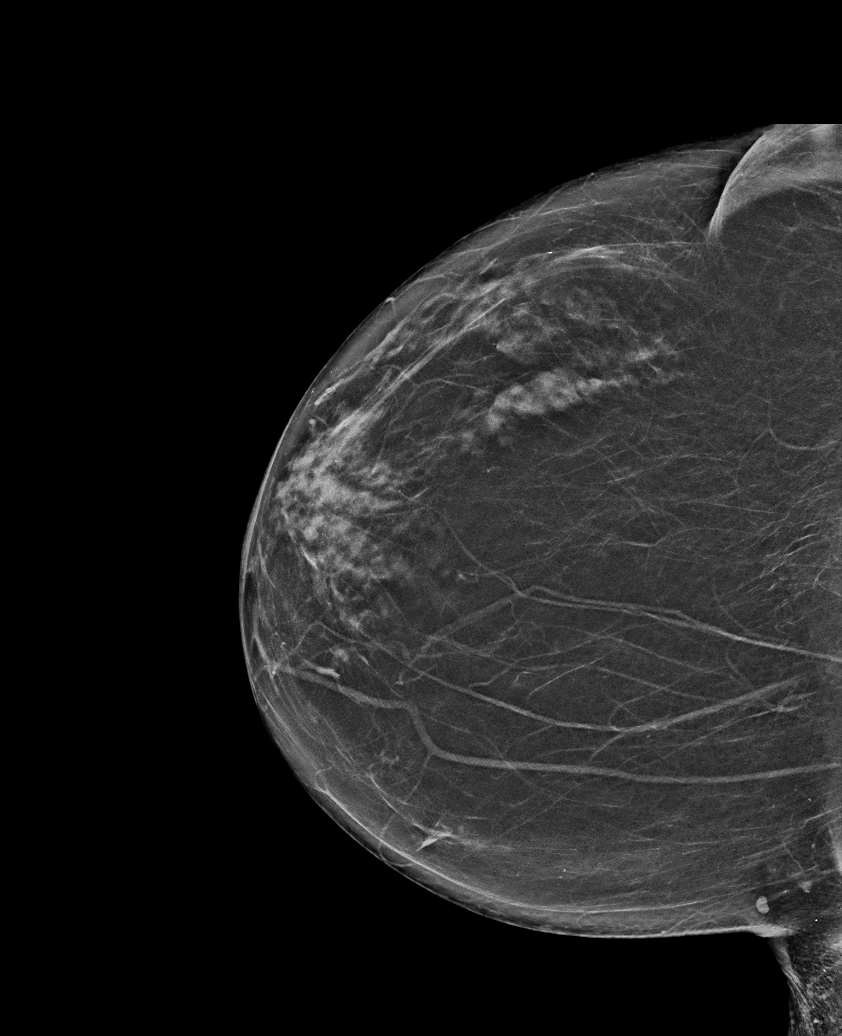

[L CC synth-2D (2 of 2)]
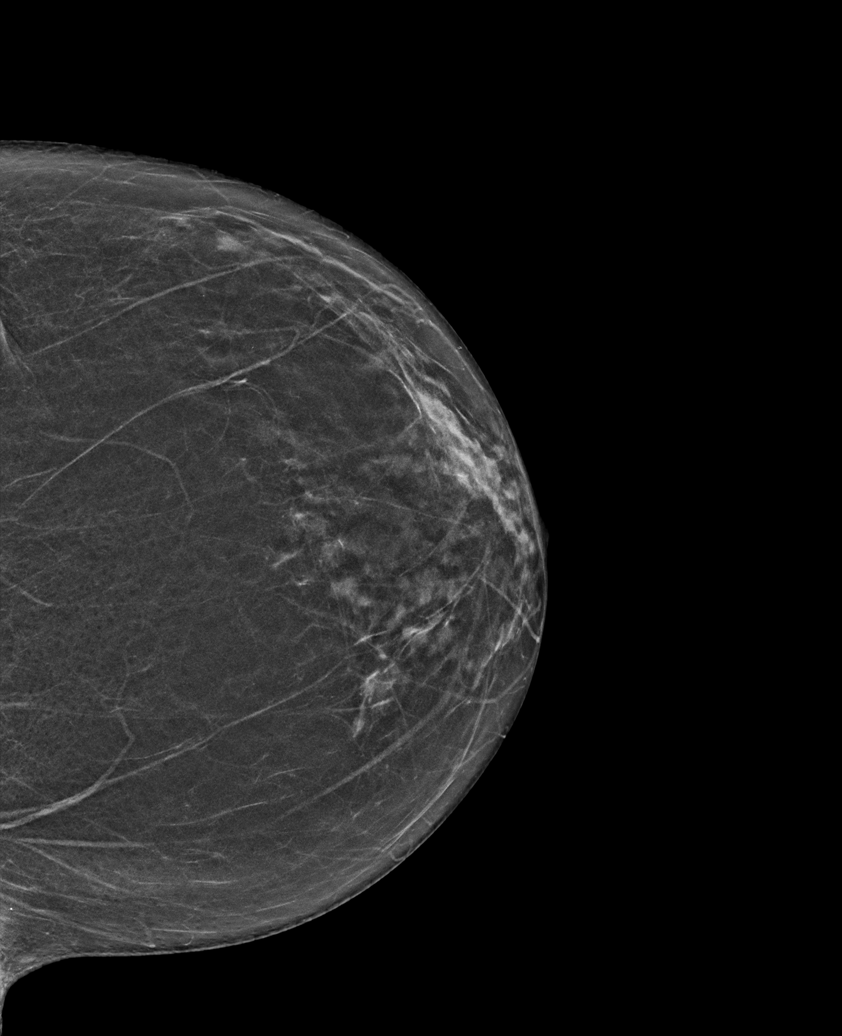

[L CC tomo · tomo slice 29/57.0]
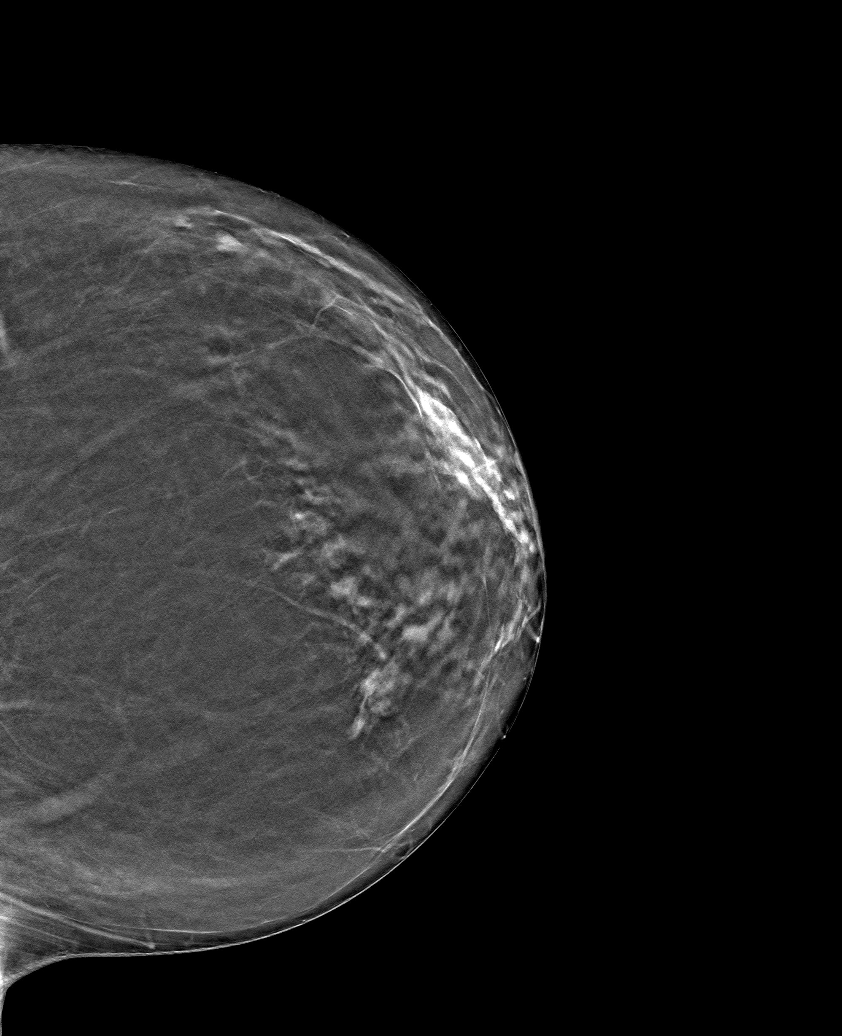

[6 of 30 positions shown; findings below may reference images not displayed]

ACR Breast Density Category b: There are scattered areas of
fibroglandular density.
FINDINGS: There are no findings suspicious for malignancy.
IMPRESSION: No mammographic evidence of malignancy. A result letter of this
screening mammogram will be mailed directly to the patient.

RECOMMENDATION:
Screening mammogram in one year. (Code:51-O-LD2)

BI-RADS CATEGORY  1: Negative.

## 2022-06-17 ENCOUNTER — Other Ambulatory Visit: Payer: Self-pay | Admitting: Internal Medicine

## 2022-06-21 ENCOUNTER — Ambulatory Visit (INDEPENDENT_AMBULATORY_CARE_PROVIDER_SITE_OTHER): Payer: Medicare Other | Admitting: Psychology

## 2022-06-21 DIAGNOSIS — F331 Major depressive disorder, recurrent, moderate: Secondary | ICD-10-CM
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
## 2022-06-23 ENCOUNTER — Telehealth: Payer: Self-pay | Admitting: Internal Medicine

## 2022-06-23 NOTE — Telephone Encounter (Signed)
Called [facility] spoke w/ rep. Place order for Vrayler. Order # [id number]. Next refill June 17th.Marland KitchenRaechel Chute

## 2022-06-23 NOTE — Telephone Encounter (Signed)
Patient needs you to order her patient assistance medication - Vraylar - Please call her when medication comes in .  Phone:  [phone]

## 2022-06-30 ENCOUNTER — Other Ambulatory Visit: Payer: Self-pay | Admitting: Internal Medicine

## 2022-06-30 DIAGNOSIS — E559 Vitamin D deficiency, unspecified: Secondary | ICD-10-CM

## 2022-06-30 DIAGNOSIS — E875 Hyperkalemia: Secondary | ICD-10-CM

## 2022-06-30 DIAGNOSIS — J4489 Other specified chronic obstructive pulmonary disease: Secondary | ICD-10-CM

## 2022-06-30 DIAGNOSIS — L509 Urticaria, unspecified: Secondary | ICD-10-CM

## 2022-06-30 DIAGNOSIS — E539 Vitamin B deficiency, unspecified: Secondary | ICD-10-CM

## 2022-07-05 ENCOUNTER — Ambulatory Visit (INDEPENDENT_AMBULATORY_CARE_PROVIDER_SITE_OTHER): Payer: Medicare Other | Admitting: Psychology

## 2022-07-05 DIAGNOSIS — F331 Major depressive disorder, recurrent, moderate: Secondary | ICD-10-CM

## 2022-07-05 MED ORDER — CARIPRAZINE HCL 1.5 MG PO CAPS: 1.5000 mg | ORAL_CAPSULE | Freq: Every day | ORAL | 0 refills | Status: DC

## 2022-07-05 NOTE — Telephone Encounter (Signed)
Called pt no answer LMOM Vraylar is ready for pick-up.Marland KitchenRaechel Chute

## 2022-07-05 NOTE — Telephone Encounter (Signed)
Patient still has not received medication - please follow up on this .

## 2022-07-05 NOTE — Addendum Note (Signed)
Addended by: Deatra James on: 07/05/2022 02:00 PM   Modules accepted: Orders

## 2022-07-05 NOTE — Progress Notes (Signed)
[facility] Counselor/Therapist Progress Note  Patient ID: Veronica [name], MRN: [id number],    Date: 07/05/2022  Time Spent: 12:00pm-12:45pm   45 minutes   Treatment Type: Individual Therapy  Reported Symptoms: stress, loneliness  Mental Status Exam: Appearance:  Casual     Behavior: Appropriate  Motor: Normal  Speech/Language:  Normal Rate  Affect: Appropriate  Mood: normal  Thought process: normal  Thought content:   WNL  Sensory/Perceptual disturbances:   WNL  Orientation: oriented to person, place, time/date, and situation  Attention: Good  Concentration: Good  Memory: WNL  Fund of knowledge:  Good  Insight:   Good  Judgment:  Good  Impulse Control: Good   Risk Assessment: Danger to Self:  No Self-injurious Behavior: No Danger to Others: No Duty to Warn:no Physical Aggression / Violence:No  Access to Firearms a concern: No  Gang Involvement:No   Subjective: Pt present for face-to-face individual therapy via video.  Pt consents to telehealth video session due to COVID 19 pandemic. Location of pt: home Location of therapist: home office.  Pt talked about her health.   She is struggling with allergies. Pt has had home repair issues.   She had bathroom water issues.  She had to have a plumber fix things and have bathroom repairs done.  Addressed how stressful this was for pt. Pt talked about her relationship with her brother Veronica [name].  He stopped by her house last week and brought pt lunch and told her he loves her.  He visited with pt for awhile which meant a lot to her.  Pt talked about having trouble getting back to sleep in the middle of the night bc of worry thoughts.   Worked on calming strategies and thought reframing.   Worked on self care strategies. Provided supportive therapy.    Interventions: Cognitive Behavioral Therapy and Interpersonal  Diagnosis:  F33.1  Plan of Care: Recommend ongoing therapy.   Pt participated in setting therapy  goals.  Pt wants to have someone to talk to and improve coping skills.   Plan to continue to meet every two weeks.    Treatment Plan (Treatment Plan Target Date: 06/21/2023) Client Abilities/Strengths  Pt is bright, engaging, and motivated for therapy.   Client Treatment Preferences  Individual therapy.  Client Statement of Needs  Improve coping skills.  Symptoms  Depressed or irritable mood. Lack of energy. Feelings of hopelessness, worthlessness, or inappropriate guilt. Low self-esteem. Unresolved grief issues.   Problems Addressed  Unipolar Depression Goals 1. Alleviate depressive symptoms and return to previous level of effective functioning. 2. Appropriately grieve the loss in order to normalize mood and to return to previously adaptive level of functioning. Objective Learn and implement behavioral strategies to overcome depression. Target Date: 2023-06-21 Frequency: Biweekly  Progress: 50 Modality: individual  Related Interventions Engage the client in "behavioral activation," increasing his/her activity level and contact with sources of reward, while identifying processes that inhibit activation.  Use behavioral techniques such as instruction, rehearsal, role-playing, role reversal, as needed, to facilitate activity in the client's daily life; reinforce success. Assist the client in developing skills that increase the likelihood of deriving pleasure from behavioral activation (e.g., assertiveness skills, developing an exercise plan, less internal/more external focus, increased social involvement); reinforce success. Objective Identify important people in life, past and present, and describe the quality, good and poor, of those relationships. Target Date: 2023-06-21 Frequency: Biweekly  Progress: 50 Modality: individual  Related Interventions Conduct Interpersonal Therapy beginning with the assessment of  the client's "interpersonal inventory" of important past and present  relationships; develop a case formulation linking depression to grief, interpersonal role disputes, role transitions, and/or interpersonal deficits). Objective Learn and implement problem-solving and decision-making skills. Target Date: 2023-06-21 Frequency: Biweekly  Progress: 50 Modality: individual  Related Interventions Conduct Problem-Solving Therapy using techniques such as psychoeducation, modeling, and role-playing to teach client problem-solving skills (i.e., defining a problem specifically, generating possible solutions, evaluating the pros and cons of each solution, selecting and implementing a plan of action, evaluating the efficacy of the plan, accepting or revising the plan); role-play application of the problem-solving skill to a real life issue. Encourage in the client the development of a positive problem orientation in which problems and solving them are viewed as a natural part of life and not something to be feared, despaired, or avoided. 3. Develop healthy interpersonal relationships that lead to the alleviation and help prevent the relapse of depression. 4. Develop healthy thinking patterns and beliefs about self, others, and the world that lead to the alleviation and help prevent the relapse of depression. 5. Recognize, accept, and cope with feelings of depression. Diagnosis F33.1  Conditions For Discharge Achievement of treatment goals and objectives   Salomon Fick, LCSW

## 2022-07-09 ENCOUNTER — Other Ambulatory Visit: Payer: Self-pay | Admitting: Internal Medicine

## 2022-07-09 ENCOUNTER — Telehealth: Payer: Self-pay | Admitting: Internal Medicine

## 2022-07-09 NOTE — Telephone Encounter (Signed)
Called pt for clarification as she already has a refill on file, pt reports she spoke w [facility] and they told her she does not have any more refills. Please advise refill of Promethazine DM syrup

## 2022-07-09 NOTE — Telephone Encounter (Signed)
Prescription Request  07/09/2022  LOV: 05/04/2022  What is the name of the medication or equipment? promethazine-dextromethorphan (PROMETHAZINE-DM) 6.25-15 MG/5ML syrup  Pt still coughing  Have you contacted your pharmacy to request a refill? No   Which pharmacy would you like this sent to?   Patient notified that their re [id number] - [place], [place] - [address] AT [facility] [address] [place] [address] Phone: [phone] Fax: [phone]  request is being sent to the clinical staff for review and that they should receive a response within 2 business days.   Please advise at Mobile [phone] (mobile)

## 2022-07-10 MED ORDER — PROMETHAZINE-DM 6.25-15 MG/5ML PO SYRP: ORAL_SOLUTION | ORAL | 1 refills | Status: DC

## 2022-07-10 NOTE — Telephone Encounter (Signed)
Refilled. Thank you.

## 2022-07-14 ENCOUNTER — Ambulatory Visit (INDEPENDENT_AMBULATORY_CARE_PROVIDER_SITE_OTHER): Payer: Medicare Other | Admitting: Pulmonary Disease

## 2022-07-14 ENCOUNTER — Encounter: Payer: Self-pay | Admitting: Pulmonary Disease

## 2022-07-14 VITALS — BP 128/78 | HR 69 | Ht 62.0 in | Wt 157.8 lb

## 2022-07-14 DIAGNOSIS — Z5181 Encounter for therapeutic drug level monitoring: Secondary | ICD-10-CM

## 2022-07-14 DIAGNOSIS — R601 Generalized edema: Secondary | ICD-10-CM

## 2022-07-14 LAB — BRAIN NATRIURETIC PEPTIDE: Pro B Natriuretic peptide (BNP): 56 pg/mL (ref 0.0–100.0)

## 2022-07-14 LAB — BASIC METABOLIC PANEL
BUN: 10 mg/dL (ref 6–23)
CO2: 31 mEq/L (ref 19–32)
Calcium: 7.9 mg/dL — ABNORMAL LOW (ref 8.4–10.5)
Chloride: 101 mEq/L (ref 96–112)
Creatinine, Ser: 0.69 mg/dL (ref 0.40–1.20)
GFR: 86.16 mL/min (ref >60.00)
Glucose, Bld: 88 mg/dL (ref 70–99)
Potassium: 4.8 mEq/L (ref 3.5–5.1)
Sodium: 140 mEq/L (ref 135–145)

## 2022-07-14 MED ORDER — FUROSEMIDE 40 MG PO TABS: 40.0000 mg | ORAL_TABLET | Freq: Every day | ORAL | 0 refills | Status: DC

## 2022-07-14 NOTE — Patient Instructions (Addendum)
Nice to see you again  I worry some your shortness of breath could related to fluid accumulation  Your weight is up 20 pounds in 2 months  Take Lasix 40 mg once a day.  You can take 2 of your 20 mg tabs once a day until you exhaust your current supply.  I sent a new prescription to the pharmacy for 40 mg tablets.  Please after you urinate first thing in the morning weigh yourself first thing in the morning.  And document or write these down.  I would like to see over the next several days with the increase in Lasix dose that you feel like you are peeing more and your weight is decreasing.  Return to clinic next Tuesday 5/7 for repeat labs to make sure that the increased dose of Lasix is not harming your kidneys or other electrolytes  Return to clinic for follow-up with Dr. Judeth Horn in 4 weeks or sooner as needed
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
## 2022-07-19 ENCOUNTER — Ambulatory Visit: Payer: Medicare Other | Admitting: Psychology

## 2022-07-20 ENCOUNTER — Other Ambulatory Visit (INDEPENDENT_AMBULATORY_CARE_PROVIDER_SITE_OTHER): Payer: Medicare Other

## 2022-07-20 ENCOUNTER — Other Ambulatory Visit: Payer: Self-pay | Admitting: Pulmonary Disease

## 2022-07-20 DIAGNOSIS — R601 Generalized edema: Secondary | ICD-10-CM

## 2022-07-20 DIAGNOSIS — Z5181 Encounter for therapeutic drug level monitoring: Secondary | ICD-10-CM

## 2022-07-20 LAB — BASIC METABOLIC PANEL
BUN: 14 mg/dL (ref 6–23)
CO2: 37 mEq/L — ABNORMAL HIGH (ref 19–32)
Calcium: 7.9 mg/dL — ABNORMAL LOW (ref 8.4–10.5)
Chloride: 93 mEq/L — ABNORMAL LOW (ref 96–112)
Creatinine, Ser: 0.85 mg/dL (ref 0.40–1.20)
GFR: 68.01 mL/min (ref >60.00)
Glucose, Bld: 95 mg/dL (ref 70–99)
Potassium: 4.1 mEq/L (ref 3.5–5.1)
Sodium: 140 mEq/L (ref 135–145)

## 2022-07-20 LAB — BRAIN NATRIURETIC PEPTIDE: Pro B Natriuretic peptide (BNP): 13 pg/mL (ref 0.0–100.0)

## 2022-07-23 ENCOUNTER — Other Ambulatory Visit: Payer: Self-pay | Admitting: Internal Medicine

## 2022-08-02 ENCOUNTER — Ambulatory Visit (INDEPENDENT_AMBULATORY_CARE_PROVIDER_SITE_OTHER): Payer: Medicare Other | Admitting: Psychology

## 2022-08-02 DIAGNOSIS — F331 Major depressive disorder, recurrent, moderate: Secondary | ICD-10-CM | POA: Diagnosis not present

## 2022-08-02 NOTE — Progress Notes (Signed)
Behavioral Health Counselor/Therapist Progress Note  Patient ID: KAISY JAKAB, MRN: [id number],    Date: 08/02/2022  Time Spent: 12:00pm-12:45pm   45 minutes   Treatment Type: Individual Therapy  Reported Symptoms: stress, loneliness  Mental Status Exam: Appearance:  Casual     Behavior: Appropriate  Motor: Normal  Speech/Language:  Normal Rate  Affect: Appropriate  Mood: normal  Thought process: normal  Thought content:   WNL  Sensory/Perceptual disturbances:   WNL  Orientation: oriented to person, place, time/date, and situation  Attention: Good  Concentration: Good  Memory: WNL  Fund of knowledge:  Good  Insight:   Good  Judgment:  Good  Impulse Control: Good   Risk Assessment: Danger to Self:  No Self-injurious Behavior: No Danger to Others: No Duty to Warn:no Physical Aggression / Violence:No  Access to Firearms a concern: No  Gang Involvement:No   Subjective: Pt present for face-to-face individual therapy via video.  Pt consents to telehealth video session due to COVID 19 pandemic. Location of pt: home Location of therapist: home office.  Pt talked about her health.  She has had doctors' appointments bc she was retaining so much fluid.  She is having shortness of breath so she can't do much.   Pt is being followed closely by her medical team.  Pt is worried about her health. Pt talked about feeling down on Mother's Day bc she was alone.  Helped pt process her feelings.  Worked on coping strategies.  Pt talked about still having some trouble getting back to sleep in the middle of the night bc of worry thoughts.   Worked on calming strategies and thought reframing.   Worked on self care strategies. Provided supportive therapy.    Interventions: Cognitive Behavioral Therapy and Interpersonal  Diagnosis:  F33.1  Plan of Care: Recommend ongoing therapy.   Pt participated in setting therapy goals.  Pt wants to have someone to talk to and improve coping  skills.   Plan to continue to meet every two weeks.    Treatment Plan (Treatment Plan Target Date: 06/21/2023) Client Abilities/Strengths  Pt is bright, engaging, and motivated for therapy.   Client Treatment Preferences  Individual therapy.  Client Statement of Needs  Improve coping skills.  Symptoms  Depressed or irritable mood. Lack of energy. Feelings of hopelessness, worthlessness, or inappropriate guilt. Low self-esteem. Unresolved grief issues.   Problems Addressed  Unipolar Depression Goals 1. Alleviate depressive symptoms and return to previous level of effective functioning. 2. Appropriately grieve the loss in order to normalize mood and to return to previously adaptive level of functioning. Objective Learn and implement behavioral strategies to overcome depression. Target Date: 2023-06-21 Frequency: Biweekly  Progress: 50 Modality: individual  Related Interventions Engage the client in "behavioral activation," increasing his/her activity level and contact with sources of reward, while identifying processes that inhibit activation.  Use behavioral techniques such as instruction, rehearsal, role-playing, role reversal, as needed, to facilitate activity in the client's daily life; reinforce success. Assist the client in developing skills that increase the likelihood of deriving pleasure from behavioral activation (e.g., assertiveness skills, developing an exercise plan, less internal/more external focus, increased social involvement); reinforce success. Objective Identify important people in life, past and present, and describe the quality, good and poor, of those relationships. Target Date: 2023-06-21 Frequency: Biweekly  Progress: 50 Modality: individual  Related Interventions Conduct Interpersonal Therapy beginning with the assessment of the client's "interpersonal inventory" of important past and present relationships; develop a case formulation  linking depression to  grief, interpersonal role disputes, role transitions, and/or interpersonal deficits). Objective Learn and implement problem-solving and decision-making skills. Target Date: 2023-06-21 Frequency: Biweekly  Progress: 50 Modality: individual  Related Interventions Conduct Problem-Solving Therapy using techniques such as psychoeducation, modeling, and role-playing to teach client problem-solving skills (i.e., defining a problem specifically, generating possible solutions, evaluating the pros and cons of each solution, selecting and implementing a plan of action, evaluating the efficacy of the plan, accepting or revising the plan); role-play application of the problem-solving skill to a real life issue. Encourage in the client the development of a positive problem orientation in which problems and solving them are viewed as a natural part of life and not something to be feared, despaired, or avoided. 3. Develop healthy interpersonal relationships that lead to the alleviation and help prevent the relapse of depression. 4. Develop healthy thinking patterns and beliefs about self, others, and the world that lead to the alleviation and help prevent the relapse of depression. 5. Recognize, accept, and cope with feelings of depression. Diagnosis F33.1  Conditions For Discharge Achievement of treatment goals and objectives   Salomon Fick, LCSW

## 2022-08-03 ENCOUNTER — Encounter: Payer: Self-pay | Admitting: Internal Medicine

## 2022-08-03 ENCOUNTER — Ambulatory Visit (INDEPENDENT_AMBULATORY_CARE_PROVIDER_SITE_OTHER): Payer: Medicare Other | Admitting: Internal Medicine

## 2022-08-03 ENCOUNTER — Telehealth: Payer: Self-pay | Admitting: Radiology

## 2022-08-03 ENCOUNTER — Ambulatory Visit (INDEPENDENT_AMBULATORY_CARE_PROVIDER_SITE_OTHER): Payer: Medicare Other

## 2022-08-03 ENCOUNTER — Telehealth: Payer: Self-pay | Admitting: Internal Medicine

## 2022-08-03 VITALS — BP 120/82 | HR 70 | Temp 98.0°F | Ht 62.0 in | Wt 151.0 lb

## 2022-08-03 DIAGNOSIS — R635 Abnormal weight gain: Secondary | ICD-10-CM

## 2022-08-03 DIAGNOSIS — E559 Vitamin D deficiency, unspecified: Secondary | ICD-10-CM | POA: Diagnosis not present

## 2022-08-03 DIAGNOSIS — E875 Hyperkalemia: Secondary | ICD-10-CM

## 2022-08-03 DIAGNOSIS — R140 Abdominal distension (gaseous): Secondary | ICD-10-CM | POA: Diagnosis not present

## 2022-08-03 DIAGNOSIS — J4489 Other specified chronic obstructive pulmonary disease: Secondary | ICD-10-CM | POA: Diagnosis not present

## 2022-08-03 DIAGNOSIS — L509 Urticaria, unspecified: Secondary | ICD-10-CM | POA: Diagnosis not present

## 2022-08-03 DIAGNOSIS — R109 Unspecified abdominal pain: Secondary | ICD-10-CM | POA: Diagnosis not present

## 2022-08-03 DIAGNOSIS — E539 Vitamin B deficiency, unspecified: Secondary | ICD-10-CM

## 2022-08-03 DIAGNOSIS — E876 Hypokalemia: Secondary | ICD-10-CM

## 2022-08-03 LAB — URINALYSIS
Bilirubin Urine: NEGATIVE
Hgb urine dipstick: NEGATIVE
Ketones, ur: NEGATIVE
Leukocytes,Ua: NEGATIVE
Nitrite: NEGATIVE
Specific Gravity, Urine: 1.01 (ref 1.000–1.030)
Total Protein, Urine: NEGATIVE
Urine Glucose: NEGATIVE
Urobilinogen, UA: 0.2 (ref 0.0–1.0)
pH: 6.5 (ref 5.0–8.0)

## 2022-08-03 LAB — COMPREHENSIVE METABOLIC PANEL
ALT: 16 U/L (ref 0–35)
AST: 25 U/L (ref 0–37)
Albumin: 4.1 g/dL (ref 3.5–5.2)
Alkaline Phosphatase: 111 U/L (ref 39–117)
BUN: 10 mg/dL (ref 6–23)
CO2: 37 mEq/L — ABNORMAL HIGH (ref 19–32)
Calcium: 7.5 mg/dL — ABNORMAL LOW (ref 8.4–10.5)
Chloride: 89 mEq/L — ABNORMAL LOW (ref 96–112)
Creatinine, Ser: 0.71 mg/dL (ref 0.40–1.20)
GFR: 84.38 mL/min (ref >60.00)
Glucose, Bld: 116 mg/dL — ABNORMAL HIGH (ref 70–99)
Potassium: 2.8 mEq/L — CL (ref 3.5–5.1)
Sodium: 140 mEq/L (ref 135–145)
Total Bilirubin: 0.3 mg/dL (ref 0.2–1.2)
Total Protein: 7.4 g/dL (ref 6.0–8.3)

## 2022-08-03 LAB — CBC WITH DIFFERENTIAL/PLATELET
Basophils Absolute: 0 10*3/uL (ref 0.0–0.1)
Basophils Relative: 0.4 % (ref 0.0–3.0)
Eosinophils Absolute: 0.2 10*3/uL (ref 0.0–0.7)
Eosinophils Relative: 2 % (ref 0.0–5.0)
HCT: 41.3 % (ref 36.0–46.0)
Hemoglobin: 13.7 g/dL (ref 12.0–15.0)
Lymphocytes Relative: 25.7 % (ref 12.0–46.0)
Lymphs Abs: 2.3 10*3/uL (ref 0.7–4.0)
MCHC: 33.1 g/dL (ref 30.0–36.0)
MCV: 88.5 fl (ref 78.0–100.0)
Monocytes Absolute: 1.4 10*3/uL — ABNORMAL HIGH (ref 0.1–1.0)
Monocytes Relative: 15.9 % — ABNORMAL HIGH (ref 3.0–12.0)
Neutro Abs: 4.9 10*3/uL (ref 1.4–7.7)
Neutrophils Relative %: 56 % (ref 43.0–77.0)
Platelets: 325 10*3/uL (ref 150.0–400.0)
RBC: 4.66 Mil/uL (ref 3.87–5.11)
RDW: 13.7 % (ref 11.5–15.5)
WBC: 8.8 10*3/uL (ref 4.0–10.5)

## 2022-08-03 LAB — T4, FREE: Free T4: 1 ng/dL (ref 0.60–1.60)

## 2022-08-03 LAB — TSH: TSH: 0.69 u[IU]/mL (ref 0.35–5.50)

## 2022-08-03 MED ORDER — FUROSEMIDE 40 MG PO TABS: 40.0000 mg | ORAL_TABLET | Freq: Every day | ORAL | 0 refills | Status: DC

## 2022-08-03 MED ORDER — ACETAMINOPHEN-CODEINE 300-30 MG PO TABS: 1.0000 | ORAL_TABLET | Freq: Four times a day (QID) | ORAL | 3 refills | Status: DC | PRN

## 2022-08-03 MED ORDER — POTASSIUM CHLORIDE CRYS ER 20 MEQ PO TBCR: 20.0000 meq | EXTENDED_RELEASE_TABLET | Freq: Every day | ORAL | 3 refills | Status: DC

## 2022-08-03 MED ORDER — CLONAZEPAM 1 MG PO TABS: ORAL_TABLET | ORAL | 1 refills | Status: DC

## 2022-08-03 MED ORDER — FUROSEMIDE 40 MG PO TABS: 40.0000 mg | ORAL_TABLET | Freq: Every day | ORAL | 1 refills | Status: DC

## 2022-08-03 MED ORDER — PROMETHAZINE-DM 6.25-15 MG/5ML PO SYRP: ORAL_SOLUTION | ORAL | 1 refills | Status: DC

## 2022-08-03 NOTE — Telephone Encounter (Signed)
Patient said that [facility] did not receive the acetimenophen with codeien - please resend

## 2022-08-03 NOTE — Assessment & Plan Note (Addendum)
Abd X ray to rule out constipation.  The patient had  a fairly normal abdominal MRI a few months ago Continue furosemide prn edema Check TSH, FT4

## 2022-08-03 NOTE — Telephone Encounter (Signed)
Per chart MD sent rx to [facility] called pharmacy rec'd msg on break from 1:30-2:00.Marland KitchenRaechel Chute

## 2022-08-03 NOTE — Assessment & Plan Note (Signed)
Relapsed.  Start K-Dur 20 milligrams daily

## 2022-08-03 NOTE — Telephone Encounter (Signed)
Called pt inform.Veronica KitchenMarland KitchenMarland KitchenCalled pharmacy spoke w/ tech she states prescription is here and ready for pick-up.Veronica KitchenRaechel Chute

## 2022-08-03 NOTE — Telephone Encounter (Signed)
Veronica [name] needs to start taking potassium.  Prescription was emailed to her pharmacy.

## 2022-08-03 NOTE — Progress Notes (Signed)
Subjective:  Patient ID: AUBURN ROTE, female    DOB: June 10, 1950  Age: 72 y.o. MRN: [id number]  CC: Follow-up (3 mnth f/u)   HPI Veronica [name] presents for a 5-month follow-up.  The patient is complaining of weight gain, bloating, chronic shortness of breath  Per Dr. Judeth Horn:   " Generalized edema  R60.1 B Nat Peptide       B Nat Peptide         Acute to subacute dyspnea: Worsened over the last few weeks.  20 pound weight gain in 2 months.  Possibility of the pollen but certainly the lower extremity swelling on exam but is worse in the weight gain is concerning for cardiogenic volume overload.  Fortunately lung exam is relatively clear.  Increase Lasix to 40 mg daily.  Will need labs today for therapeutic drug monitoring high intensity.  Repeat labs next week for high intensity drug monitoring.   Chronic DOE and cough due to bronchiectasis: With ongoing cough, nocturnal symptoms felt to be more related to possible asthma versus exacerbation of bronchiectasis. -Con't performist and pulmicort 0.5 mg BID. - Continue to use vest therapy BID -Resume HTS if sputum production worsens -Continue albuterol every 4 hours as needed -Con't regular physical activity as tolerated.   Moderate persistent asthma: -Continue nebulized ICS-LABA therapy as above     RTC in 3 months with Dr. Judeth Horn"    Outpatient Medications Prior to Visit  Medication Sig Dispense Refill   albuterol (PROVENTIL) (2.5 MG/3ML) 0.083% nebulizer solution USE 1 VIAL IN NEBULIZER 2 TIMES DAILY. Generic: VENTOLIN 180 mL 3   albuterol (VENTOLIN HFA) 108 (90 Base) MCG/ACT inhaler INHALE 2 PUFFS INTO THE LUNGS EVERY 4 HOURS AS NEEDED FOR WHEEZING OR SHORTNESS OF BREATH 18 g 11   aspirin 81 MG chewable tablet Chew by mouth daily.     atorvastatin (LIPITOR) 40 MG tablet TAKE 1 TABLET BY MOUTH EVERY DAY 90 tablet 1   budesonide (PULMICORT) 0.5 MG/2ML nebulizer solution Take 2 mLs (0.5 mg total) by nebulization in the  morning and at bedtime. 120 mL 11   calcium carbonate (OSCAL) 1500 (600 Ca) MG TABS tablet Take 600 mg of elemental calcium by mouth 2 (two) times daily with a meal.      cariprazine (VRAYLAR) 1.5 MG capsule Take 1 capsule (1.5 mg total) by mouth daily. [employer] Asst @ [id number] Lot # [id number] Exp- [id number] 90 capsule 0   chlorhexidine (PERIDEX) 0.12 % solution Use to wash mouth twice a day     Cholecalciferol (VITAMIN D3) 50 MCG (2000 UT) capsule Take 1 capsule (2,000 Units total) by mouth daily. 100 capsule 3   cyanocobalamin (VITAMIN B12) 1000 MCG/ML injection INJECT IN THE MUSCLE EVERY 14 DAYS. 10 mL 5   diphenoxylate-atropine (LOMOTIL) 2.5-0.025 MG tablet Take 1 tablet by mouth 4 (four) times daily as needed for diarrhea or loose stools. 60 tablet 2   Docusate Calcium (STOOL SOFTENER PO) Take 100 mg by mouth as needed (for constipation).     escitalopram (LEXAPRO) 20 MG tablet TAKE 1 TABLET(20 MG) BY MOUTH DAILY 90 tablet 3   ezetimibe (ZETIA) 10 MG tablet Take 1 tablet (10 mg total) by mouth daily. 90 tablet 3   Insulin Syringe-Needle U-100 (B-D INSULIN SYRINGE 1CC/25GX1") 25G X 1" 1 ML MISC USE AS DIRECTED EVERY 2 WEEKS 50 each 2   isosorbide mononitrate (IMDUR) 30 MG 24 hr tablet Take 1 tablet (30 mg total) by mouth daily. 90  tablet 3   ketoconazole (NIZORAL) 2 % cream Apply 1 Application topically daily. 60 g 1   levothyroxine (SYNTHROID) 88 MCG tablet TAKE 1 TABLET(88 MCG) BY MOUTH DAILY 90 tablet 3   metoprolol tartrate (LOPRESSOR) 50 MG tablet TAKE 1 TABLET 2 HOURS PRIOR TO TEST 1 tablet 0   Multiple Vitamins-Minerals (PRESERVISION AREDS 2 PO) Take by mouth.     nitroGLYCERIN (NITROSTAT) 0.4 MG SL tablet DISSOLVE 1 TABLET UNDER THE TONGUE AS DIRECTED. MAX 3 DOSES. CALL 911 IF NEEDING ALL 3 DOSES 25 tablet 2   nystatin (MYCOSTATIN) 100000 UNIT/ML suspension Take 5 mLs (500,000 Units total) by mouth 4 (four) times daily. 60 mL 0   Omega-3 Fatty Acids (FISH OIL) 1000 MG CAPS Take 1  capsule by mouth daily.     ondansetron (ZOFRAN) 4 MG tablet Take 1 tablet (4 mg total) by mouth every 8 (eight) hours as needed for nausea or vomiting. 30 tablet 2   phenytoin (DILANTIN) 100 MG ER capsule TAKE 2 CAPSULES BY MOUTH EVERY MORNING AND 1 CAPSULE BY MOUTH EVERY EVENING 270 capsule 3   phenytoin (DILANTIN) 100 MG ER capsule TAKE 2 CAPSULES BY MOUTH EVERY MORNING AND 1 CAPSULE BY MOUTH EVERY EVENING 270 capsule 3   promethazine (PHENERGAN) 12.5 MG tablet Take by mouth.     Respiratory Therapy Supplies (FLUTTER) DEVI Use after nebulization treatments 1 each 0   sodium chloride (MURO 128) 5 % ophthalmic solution Place 1 drop into both eyes as needed for irritation.     SYRINGE-NEEDLE, DISP, 3 ML (BD ECLIPSE SYRINGE) 25G X 1" 3 ML MISC Use sq q 2 wks 50 each 3   traZODone (DESYREL) 50 MG tablet TAKE 4 TABLETS(200 MG) BY MOUTH AT BEDTIME 360 tablet 3   verapamil (CALAN-SR) 240 MG CR tablet TAKE 1 TABLET(240 MG) BY MOUTH AT BEDTIME 90 tablet 3   acetaminophen-codeine (TYLENOL #3) 300-30 MG tablet TAKE 1 TABLET BY MOUTH EVERY 6 HOURS AS NEEDED 60 tablet 3   clonazePAM (KLONOPIN) 1 MG tablet TAKE 2 TABLETS BY MOUTH EVERY NIGHT AT BEDTIME AND 1/2 TABLET EXTRA IN DAYTIME AS NEEDED 225 tablet 1   furosemide (LASIX) 40 MG tablet Take 1 tablet (40 mg total) by mouth daily. 30 tablet 0   promethazine-dextromethorphan (PROMETHAZINE-DM) 6.25-15 MG/5ML syrup TAKE 5 ML BY MOUTH FOUR TIMES DAILY AS NEEDED FOR COUGH 240 mL 1   No facility-administered medications prior to visit.    ROS: Review of Systems  Constitutional:  Positive for fatigue and unexpected weight change. Negative for activity change, appetite change and chills.  HENT:  Negative for congestion, mouth sores and sinus pressure.   Eyes:  Negative for visual disturbance.  Respiratory:  Positive for cough, shortness of breath and wheezing. Negative for chest tightness.   Cardiovascular:  Positive for leg swelling. Negative for chest pain  and palpitations.  Gastrointestinal:  Negative for abdominal pain and nausea.  Genitourinary:  Negative for difficulty urinating, frequency and vaginal pain.  Musculoskeletal:  Positive for gait problem. Negative for back pain.  Skin:  Negative for pallor and rash.  Neurological:  Negative for dizziness, tremors, weakness, numbness and headaches.  Psychiatric/Behavioral:  Negative for confusion, sleep disturbance and suicidal ideas. The patient is nervous/anxious.     Objective:  BP 120/82 (BP Location: Left Arm, Patient Position: Sitting, Cuff Size: Large)   Pulse 70   Temp 98 F (36.7 C) (Oral)   Ht 5\' 2"  (1.575 m)   Wt 151 lb (  68.5 kg)   SpO2 96%   BMI 27.62 kg/m   BP Readings from Last 3 Encounters:  08/03/22 120/82  07/14/22 128/78  05/04/22 122/76    Wt Readings from Last 3 Encounters:  08/03/22 151 lb (68.5 kg)  07/14/22 157 lb 12.8 oz (71.6 kg)  05/04/22 137 lb (62.1 kg)    Physical Exam Constitutional:      General: She is not in acute distress.    Appearance: She is well-developed. She is obese.  HENT:     Head: Normocephalic.     Right Ear: External ear normal.     Left Ear: External ear normal.     Nose: Nose normal.  Eyes:     General:        Right eye: No discharge.        Left eye: No discharge.     Conjunctiva/sclera: Conjunctivae normal.     Pupils: Pupils are equal, round, and reactive to light.  Neck:     Thyroid: No thyromegaly.     Vascular: No JVD.     Trachea: No tracheal deviation.  Cardiovascular:     Rate and Rhythm: Normal rate and regular rhythm.     Heart sounds: Normal heart sounds.  Pulmonary:     Effort: No respiratory distress.     Breath sounds: No stridor. No wheezing.  Abdominal:     General: Bowel sounds are normal. There is no distension.     Palpations: Abdomen is soft. There is no mass.     Tenderness: There is no abdominal tenderness. There is no guarding or rebound.  Musculoskeletal:        General: No tenderness.      Cervical back: Normal range of motion and neck supple. No rigidity.  Lymphadenopathy:     Cervical: No cervical adenopathy.  Skin:    Findings: No erythema or rash.  Neurological:     Cranial Nerves: No cranial nerve deficit.     Motor: No abnormal muscle tone.     Coordination: Coordination normal.     Deep Tendon Reflexes: Reflexes normal.  Psychiatric:        Behavior: Behavior normal.        Thought Content: Thought content normal.        Judgment: Judgment normal.   Abdomen is bloated.  No pain or mass  Lab Results  Component Value Date   WBC 8.8 08/03/2022   HGB 13.7 08/03/2022   HCT 41.3 08/03/2022   PLT 325.0 08/03/2022   GLUCOSE 116 (H) 08/03/2022   CHOL 196 11/03/2021   TRIG 119 11/03/2021   HDL 63 11/03/2021   LDLDIRECT 165.0 09/26/2009   LDLCALC 112 (H) 11/03/2021   ALT 16 08/03/2022   AST 25 08/03/2022   NA 140 08/03/2022   K 2.8 (LL) 08/03/2022   CL 89 (L) 08/03/2022   CREATININE 0.71 08/03/2022   BUN 10 08/03/2022   CO2 37 (H) 08/03/2022   TSH 0.69 08/03/2022   INR 1.0 12/18/2021   HGBA1C 5.7 06/10/2021    MR Abdomen W Wo Contrast  Result Date: 12/29/2021 CLINICAL DATA:  Possible hepatic cirrhosis and indeterminate liver lesion on recent noncontrast chest CT. EXAM: MRI ABDOMEN WITHOUT AND WITH CONTRAST TECHNIQUE: Multiplanar multisequence MR imaging of the abdomen was performed both before and after the administration of intravenous contrast. CONTRAST:  7mL GADAVIST GADOBUTROL 1 MMOL/ML IV SOLN COMPARISON:  Noncontrast chest CT on 09/14/2021 FINDINGS: Lower chest: No acute findings. Hepatobiliary: A  few tiny sub-cm hepatic cysts are seen in the right and left hepatic lobes. No hepatic masses identified. No definite gross morphologic findings of cirrhosis identified. Prior cholecystectomy. No evidence of biliary obstruction. Pancreas:  No mass or inflammatory changes. Spleen:  Within normal limits in size and appearance. Adrenals/Urinary Tract: No  suspicious masses identified. No evidence of hydronephrosis. Stomach/Bowel: Unremarkable. Vascular/Lymphatic: No pathologically enlarged lymph nodes identified. No acute vascular findings. Other:  None. Musculoskeletal:  No suspicious bone lesions identified. IMPRESSION: Several tiny sub-cm hepatic cysts. No evidence of hepatic neoplasm, or definite imaging signs of cirrhosis. Electronically Signed   By: Danae Orleans M.D.   On: 12/29/2021 15:42    Assessment & Plan:   Problem List Items Addressed This Visit     Vitamin D deficiency   HYPERKALEMIA   Chronic obstructive airway disease with asthma   Relevant Medications   promethazine-dextromethorphan (PROMETHAZINE-DM) 6.25-15 MG/5ML syrup   Abdominal bloating with cramps - Primary    Abd X ray to rule out constipation.  The patient had  a fairly normal abdominal MRI a few months ago Continue furosemide prn edema Check TSH, FT4      Relevant Orders   Comprehensive metabolic panel (Completed)   TSH (Completed)   T4, free (Completed)   CBC with Differential/Platelet (Completed)   Urinalysis (Completed)   DG Abd 2 Views   Weight gain    Abd X ray to rule out constipation.  The patient had  a fairly normal abdominal MRI a few months ago Continue furosemide prn edema Check TSH, FT4      Relevant Orders   Comprehensive metabolic panel (Completed)   TSH (Completed)   T4, free (Completed)   CBC with Differential/Platelet (Completed)   DG Abd 2 Views   Hypokalemia    Relapsed.  Start K-Dur 20 milligrams daily      Other Visit Diagnoses     URTICARIA       Vitamin B-complex deficiency             Meds ordered this encounter  Medications   clonazePAM (KLONOPIN) 1 MG tablet    Sig: TAKE 2 TABLETS BY MOUTH EVERY NIGHT AT BEDTIME AND 1/2 TABLET EXTRA IN DAYTIME AS NEEDED    Dispense:  225 tablet    Refill:  1   DISCONTD: furosemide (LASIX) 40 MG tablet    Sig: Take 1 tablet (40 mg total) by mouth daily.    Dispense:  30  tablet    Refill:  0   acetaminophen-codeine (TYLENOL #3) 300-30 MG tablet    Sig: Take 1 tablet by mouth every 6 (six) hours as needed.    Dispense:  60 tablet    Refill:  3   promethazine-dextromethorphan (PROMETHAZINE-DM) 6.25-15 MG/5ML syrup    Sig: TAKE 5 ML BY MOUTH FOUR TIMES DAILY AS NEEDED FOR COUGH    Dispense:  240 mL    Refill:  1   furosemide (LASIX) 40 MG tablet    Sig: Take 1 tablet (40 mg total) by mouth daily.    Dispense:  30 tablet    Refill:  1   potassium chloride SA (KLOR-CON M) 20 MEQ tablet    Sig: Take 1 tablet (20 mEq total) by mouth daily.    Dispense:  30 tablet    Refill:  3      Follow-up: Return in about 4 weeks (around 08/31/2022) for a follow-up visit.  Sonda Primes, MD

## 2022-08-03 NOTE — Assessment & Plan Note (Addendum)
Abd X ray to rule out constipation.  The patient had  a fairly normal abdominal MRI a few months ago Continue furosemide prn edema Check TSH, FT4 

## 2022-08-04 NOTE — Telephone Encounter (Signed)
Printed labs mailed to address.Marland KitchenRaechel Chute

## 2022-08-04 NOTE — Telephone Encounter (Signed)
Spoke with pt and and she has picked up the potassium and will start it today. She has no further questions

## 2022-08-04 NOTE — Telephone Encounter (Signed)
Patient would like a copy of her results mailed to her.

## 2022-08-12 ENCOUNTER — Ambulatory Visit (INDEPENDENT_AMBULATORY_CARE_PROVIDER_SITE_OTHER): Payer: Medicare Other | Admitting: Pulmonary Disease

## 2022-08-12 ENCOUNTER — Encounter: Payer: Self-pay | Admitting: Pulmonary Disease

## 2022-08-12 VITALS — BP 120/70 | HR 80 | Temp 97.7°F | Ht 62.0 in | Wt 150.6 lb

## 2022-08-12 DIAGNOSIS — R0609 Other forms of dyspnea: Secondary | ICD-10-CM | POA: Diagnosis not present

## 2022-08-12 MED ORDER — ARFORMOTEROL TARTRATE 15 MCG/2ML IN NEBU: 15.0000 ug | INHALATION_SOLUTION | Freq: Two times a day (BID) | RESPIRATORY_TRACT | 11 refills | Status: DC

## 2022-08-12 NOTE — Patient Instructions (Signed)
Nice to see you again  Continue the Lasix 40 mg once a day  Continue budesonide/Pulmicort nebulizer twice a day  Continue albuterol as needed nebulized and inhaler  I added arformoterol twice a day, this is a nebulizer medicine that is a long-acting version of albuterol.  I sent to the [facility] pharmacy, mail order pharmacy.  If this is too expensive please let me know, it may not be an option, I think it was too expensive in the past and why you are no longer using it  Return to clinic in 3 months or sooner as needed with Dr. Judeth Horn
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
## 2022-08-16 ENCOUNTER — Ambulatory Visit (INDEPENDENT_AMBULATORY_CARE_PROVIDER_SITE_OTHER): Payer: Medicare Other | Admitting: Psychology

## 2022-08-16 DIAGNOSIS — F331 Major depressive disorder, recurrent, moderate: Secondary | ICD-10-CM | POA: Diagnosis not present

## 2022-08-16 NOTE — Progress Notes (Signed)
Behavioral Health Counselor/Therapist Progress Note  Patient ID: Veronica [name], MRN: [id number],    Date: 08/16/2022  Time Spent: 12:00pm-12:45pm   45 minutes   Treatment Type: Individual Therapy  Reported Symptoms: stress, loneliness  Mental Status Exam: Appearance:  Casual     Behavior: Appropriate  Motor: Normal  Speech/Language:  Normal Rate  Affect: Appropriate  Mood: normal  Thought process: normal  Thought content:   WNL  Sensory/Perceptual disturbances:   WNL  Orientation: oriented to person, place, time/date, and situation  Attention: Good  Concentration: Good  Memory: WNL  Fund of knowledge:  Good  Insight:   Good  Judgment:  Good  Impulse Control: Good   Risk Assessment: Danger to Self:  No Self-injurious Behavior: No Danger to Others: No Duty to Warn:no Physical Aggression / Violence:No  Access to Firearms a concern: No  Gang Involvement:No   Subjective: Pt present for face-to-face individual therapy via video.  Pt consents to telehealth video session due to COVID 19 pandemic. Location of pt: home Location of therapist: home office.  Pt talked about her health.  Pt continues to have a lot of fatigue.   She is having shortness of breath so she can't do much.   Pt is being followed closely by her medical team.  Pt is worried about her health.  Worked on coping strategies.    Pt talked about feeling down bc of her health issues.  She gets down on herself.  Worked on thought reframing.   Pt is staying in touch with her brother.  He and his wife also have health issues which can be stressful for pt.   Worked on self care strategies. Provided supportive therapy.    Interventions: Cognitive Behavioral Therapy and Interpersonal  Diagnosis:  F33.1  Plan of Care: Recommend ongoing therapy.   Pt participated in setting therapy goals.  Pt wants to have someone to talk to and improve coping skills.   Plan to continue to meet every two weeks.     Treatment Plan (Treatment Plan Target Date: 06/21/2023) Client Abilities/Strengths  Pt is bright, engaging, and motivated for therapy.   Client Treatment Preferences  Individual therapy.  Client Statement of Needs  Improve coping skills.  Symptoms  Depressed or irritable mood. Lack of energy. Feelings of hopelessness, worthlessness, or inappropriate guilt. Low self-esteem. Unresolved grief issues.   Problems Addressed  Unipolar Depression Goals 1. Alleviate depressive symptoms and return to previous level of effective functioning. 2. Appropriately grieve the loss in order to normalize mood and to return to previously adaptive level of functioning. Objective Learn and implement behavioral strategies to overcome depression. Target Date: 2023-06-21 Frequency: Biweekly  Progress: 50 Modality: individual  Related Interventions Engage the client in "behavioral activation," increasing his/her activity level and contact with sources of reward, while identifying processes that inhibit activation.  Use behavioral techniques such as instruction, rehearsal, role-playing, role reversal, as needed, to facilitate activity in the client's daily life; reinforce success. Assist the client in developing skills that increase the likelihood of deriving pleasure from behavioral activation (e.g., assertiveness skills, developing an exercise plan, less internal/more external focus, increased social involvement); reinforce success. Objective Identify important people in life, past and present, and describe the quality, good and poor, of those relationships. Target Date: 2023-06-21 Frequency: Biweekly  Progress: 50 Modality: individual  Related Interventions Conduct Interpersonal Therapy beginning with the assessment of the client's "interpersonal inventory" of important past and present relationships; develop a case formulation linking depression to  grief, interpersonal role disputes, role transitions,  and/or interpersonal deficits). Objective Learn and implement problem-solving and decision-making skills. Target Date: 2023-06-21 Frequency: Biweekly  Progress: 50 Modality: individual  Related Interventions Conduct Problem-Solving Therapy using techniques such as psychoeducation, modeling, and role-playing to teach client problem-solving skills (i.e., defining a problem specifically, generating possible solutions, evaluating the pros and cons of each solution, selecting and implementing a plan of action, evaluating the efficacy of the plan, accepting or revising the plan); role-play application of the problem-solving skill to a real life issue. Encourage in the client the development of a positive problem orientation in which problems and solving them are viewed as a natural part of life and not something to be feared, despaired, or avoided. 3. Develop healthy interpersonal relationships that lead to the alleviation and help prevent the relapse of depression. 4. Develop healthy thinking patterns and beliefs about self, others, and the world that lead to the alleviation and help prevent the relapse of depression. 5. Recognize, accept, and cope with feelings of depression. Diagnosis F33.1  Conditions For Discharge Achievement of treatment goals and objectives   Salomon Fick, LCSW

## 2022-08-30 ENCOUNTER — Ambulatory Visit (INDEPENDENT_AMBULATORY_CARE_PROVIDER_SITE_OTHER): Payer: Medicare Other | Admitting: Psychology

## 2022-08-30 DIAGNOSIS — F331 Major depressive disorder, recurrent, moderate: Secondary | ICD-10-CM | POA: Diagnosis not present

## 2022-08-30 NOTE — Progress Notes (Signed)
[facility] Counselor/Therapist Progress Note  Patient ID: MARKELLA LAMPHIER, MRN: [id number],    Date: 08/30/2022  Time Spent: 12:00pm-12:45pm   45 minutes   Treatment Type: Individual Therapy  Reported Symptoms: stress, loneliness  Mental Status Exam: Appearance:  Casual     Behavior: Appropriate  Motor: Normal  Speech/Language:  Normal Rate  Affect: Appropriate  Mood: normal  Thought process: normal  Thought content:   WNL  Sensory/Perceptual disturbances:   WNL  Orientation: oriented to person, place, time/date, and situation  Attention: Good  Concentration: Good  Memory: WNL  Fund of knowledge:  Good  Insight:   Good  Judgment:  Good  Impulse Control: Good   Risk Assessment: Danger to Self:  No Self-injurious Behavior: No Danger to Others: No Duty to Warn:no Physical Aggression / Violence:No  Access to Firearms a concern: No  Gang Involvement:No   Subjective: Pt present for face-to-face individual therapy via phone.  Pt consents to telehealth phone session and is aware of limitations of phone sessions. (Pt does not have access to video devices.) Location of pt: home Location of therapist: home office.  Pt talked about her health.  Pt continues to have a lot of fatigue and shortness of breath.  Pt sees her PCP tomorrow and will address her concerns.   Pt is worried about her health.  Worked on coping strategies.    Pt talked about feeling down bc of her health issues.  She gets down on herself.  Worked on thought reframing.  Addressed how pt can pace herself.  Worked on self compassion.  Pt is trying to be more involved with events in her neighborhood so that she can be around people and not so lonely.    Worked on self care strategies. Provided supportive therapy.    Interventions: Cognitive Behavioral Therapy and Interpersonal  Diagnosis:  F33.1  Plan of Care: Recommend ongoing therapy.   Pt participated in setting therapy goals.  Pt wants to  have someone to talk to and improve coping skills.   Plan to continue to meet every two weeks.    Treatment Plan (Treatment Plan Target Date: 06/21/2023) Client Abilities/Strengths  Pt is bright, engaging, and motivated for therapy.   Client Treatment Preferences  Individual therapy.  Client Statement of Needs  Improve coping skills.  Symptoms  Depressed or irritable mood. Lack of energy. Feelings of hopelessness, worthlessness, or inappropriate guilt. Low self-esteem. Unresolved grief issues.   Problems Addressed  Unipolar Depression Goals 1. Alleviate depressive symptoms and return to previous level of effective functioning. 2. Appropriately grieve the loss in order to normalize mood and to return to previously adaptive level of functioning. Objective Learn and implement behavioral strategies to overcome depression. Target Date: 2023-06-21 Frequency: Biweekly  Progress: 50 Modality: individual  Related Interventions Engage the client in "behavioral activation," increasing his/her activity level and contact with sources of reward, while identifying processes that inhibit activation.  Use behavioral techniques such as instruction, rehearsal, role-playing, role reversal, as needed, to facilitate activity in the client's daily life; reinforce success. Assist the client in developing skills that increase the likelihood of deriving pleasure from behavioral activation (e.g., assertiveness skills, developing an exercise plan, less internal/more external focus, increased social involvement); reinforce success. Objective Identify important people in life, past and present, and describe the quality, good and poor, of those relationships. Target Date: 2023-06-21 Frequency: Biweekly  Progress: 50 Modality: individual  Related Interventions Conduct Interpersonal Therapy beginning with the assessment of the client's "  interpersonal inventory" of important past and present relationships; develop a  case formulation linking depression to grief, interpersonal role disputes, role transitions, and/or interpersonal deficits). Objective Learn and implement problem-solving and decision-making skills. Target Date: 2023-06-21 Frequency: Biweekly  Progress: 50 Modality: individual  Related Interventions Conduct Problem-Solving Therapy using techniques such as psychoeducation, modeling, and role-playing to teach client problem-solving skills (i.e., defining a problem specifically, generating possible solutions, evaluating the pros and cons of each solution, selecting and implementing a plan of action, evaluating the efficacy of the plan, accepting or revising the plan); role-play application of the problem-solving skill to a real life issue. Encourage in the client the development of a positive problem orientation in which problems and solving them are viewed as a natural part of life and not something to be feared, despaired, or avoided. 3. Develop healthy interpersonal relationships that lead to the alleviation and help prevent the relapse of depression. 4. Develop healthy thinking patterns and beliefs about self, others, and the world that lead to the alleviation and help prevent the relapse of depression. 5. Recognize, accept, and cope with feelings of depression. Diagnosis F33.1  Conditions For Discharge Achievement of treatment goals and objectives   Salomon Fick, LCSW

## 2022-08-31 ENCOUNTER — Ambulatory Visit (INDEPENDENT_AMBULATORY_CARE_PROVIDER_SITE_OTHER): Payer: Medicare Other | Admitting: Internal Medicine

## 2022-08-31 ENCOUNTER — Encounter: Payer: Self-pay | Admitting: Internal Medicine

## 2022-08-31 ENCOUNTER — Telehealth: Payer: Self-pay | Admitting: Internal Medicine

## 2022-08-31 VITALS — BP 118/60 | HR 72 | Temp 97.8°F | Ht 62.0 in | Wt 144.0 lb

## 2022-08-31 DIAGNOSIS — J479 Bronchiectasis, uncomplicated: Secondary | ICD-10-CM

## 2022-08-31 DIAGNOSIS — G8929 Other chronic pain: Secondary | ICD-10-CM

## 2022-08-31 DIAGNOSIS — G40909 Epilepsy, unspecified, not intractable, without status epilepticus: Secondary | ICD-10-CM

## 2022-08-31 DIAGNOSIS — E876 Hypokalemia: Secondary | ICD-10-CM | POA: Diagnosis not present

## 2022-08-31 DIAGNOSIS — E538 Deficiency of other specified B group vitamins: Secondary | ICD-10-CM

## 2022-08-31 DIAGNOSIS — R609 Edema, unspecified: Secondary | ICD-10-CM

## 2022-08-31 DIAGNOSIS — M545 Low back pain, unspecified: Secondary | ICD-10-CM

## 2022-08-31 LAB — COMPREHENSIVE METABOLIC PANEL
ALT: 14 U/L (ref 0–35)
AST: 22 U/L (ref 0–37)
Albumin: 4.5 g/dL (ref 3.5–5.2)
Alkaline Phosphatase: 100 U/L (ref 39–117)
BUN: 11 mg/dL (ref 6–23)
CO2: 31 mEq/L (ref 19–32)
Calcium: 8.4 mg/dL (ref 8.4–10.5)
Chloride: 96 mEq/L (ref 96–112)
Creatinine, Ser: 0.77 mg/dL (ref 0.40–1.20)
GFR: 76.52 mL/min (ref >60.00)
Glucose, Bld: 87 mg/dL (ref 70–99)
Potassium: 3.8 mEq/L (ref 3.5–5.1)
Sodium: 139 mEq/L (ref 135–145)
Total Bilirubin: 0.3 mg/dL (ref 0.2–1.2)
Total Protein: 8.1 g/dL (ref 6.0–8.3)

## 2022-08-31 MED ORDER — POTASSIUM CHLORIDE CRYS ER 20 MEQ PO TBCR: 20.0000 meq | EXTENDED_RELEASE_TABLET | Freq: Every day | ORAL | 3 refills | Status: DC

## 2022-08-31 MED ORDER — ESCITALOPRAM OXALATE 20 MG PO TABS: ORAL_TABLET | ORAL | 3 refills | Status: DC

## 2022-08-31 MED ORDER — FUROSEMIDE 40 MG PO TABS: 40.0000 mg | ORAL_TABLET | Freq: Every day | ORAL | 3 refills | Status: DC

## 2022-08-31 MED ORDER — PROMETHAZINE-DM 6.25-15 MG/5ML PO SYRP: ORAL_SOLUTION | ORAL | 1 refills | Status: DC

## 2022-08-31 NOTE — Assessment & Plan Note (Signed)
Chronic Cont on Dilantin 

## 2022-08-31 NOTE — Assessment & Plan Note (Signed)
Continue on Furosemide

## 2022-08-31 NOTE — Telephone Encounter (Signed)
Patient asked if she can get a refill for her patient assistance medication, cariprazine (VRAYLAR) 1.5 MG capsule .

## 2022-08-31 NOTE — Assessment & Plan Note (Signed)
On Kdur 

## 2022-08-31 NOTE — Progress Notes (Signed)
Subjective:  Patient ID: Veronica [name], female    DOB: 1951-03-08  Age: 71 y.o. MRN: [id number]  CC: Follow-up (4 week f/u)   HPI Veronica [name] presents for edema, HTN, LBP F/u on cough, low K  Outpatient Medications Prior to Visit  Medication Sig Dispense Refill   acetaminophen-codeine (TYLENOL #3) 300-30 MG tablet Take 1 tablet by mouth every 6 (six) hours as needed. 60 tablet 3   albuterol (PROVENTIL) (2.5 MG/3ML) 0.083% nebulizer solution USE 1 VIAL IN NEBULIZER 2 TIMES DAILY. Generic: VENTOLIN 180 mL 3   albuterol (VENTOLIN HFA) 108 (90 Base) MCG/ACT inhaler INHALE 2 PUFFS INTO THE LUNGS EVERY 4 HOURS AS NEEDED FOR WHEEZING OR SHORTNESS OF BREATH 18 g 11   arformoterol (BROVANA) 15 MCG/2ML NEBU Take 2 mLs (15 mcg total) by nebulization 2 (two) times daily. 120 mL 11   aspirin 81 MG chewable tablet Chew by mouth daily.     atorvastatin (LIPITOR) 40 MG tablet TAKE 1 TABLET BY MOUTH EVERY DAY 90 tablet 1   budesonide (PULMICORT) 0.5 MG/2ML nebulizer solution Take 2 mLs (0.5 mg total) by nebulization in the morning and at bedtime. 120 mL 11   calcium carbonate (OSCAL) 1500 (600 Ca) MG TABS tablet Take 600 mg of elemental calcium by mouth 2 (two) times daily with a meal.      cariprazine (VRAYLAR) 1.5 MG capsule Take 1 capsule (1.5 mg total) by mouth daily. [facility] @ [phone] Lot # [id number] Exp- [id number] 90 capsule 0   chlorhexidine (PERIDEX) 0.12 % solution Use to wash mouth twice a day     Cholecalciferol (VITAMIN D3) 50 MCG (2000 UT) capsule Take 1 capsule (2,000 Units total) by mouth daily. 100 capsule 3   clonazePAM (KLONOPIN) 1 MG tablet TAKE 2 TABLETS BY MOUTH EVERY NIGHT AT BEDTIME AND 1/2 TABLET EXTRA IN DAYTIME AS NEEDED 225 tablet 1   cyanocobalamin (VITAMIN B12) 1000 MCG/ML injection INJECT IN THE MUSCLE EVERY 14 DAYS. 10 mL 5   diphenoxylate-atropine (LOMOTIL) 2.5-0.025 MG tablet Take 1 tablet by mouth 4 (four) times daily as needed for diarrhea or loose  stools. 60 tablet 2   Docusate Calcium (STOOL SOFTENER PO) Take 100 mg by mouth as needed (for constipation).     ezetimibe (ZETIA) 10 MG tablet Take 1 tablet (10 mg total) by mouth daily. 90 tablet 3   Insulin Syringe-Needle U-100 (B-D INSULIN SYRINGE 1CC/25GX1") 25G X 1" 1 ML MISC USE AS DIRECTED EVERY 2 WEEKS 50 each 2   isosorbide mononitrate (IMDUR) 30 MG 24 hr tablet Take 1 tablet (30 mg total) by mouth daily. 90 tablet 3   ketoconazole (NIZORAL) 2 % cream Apply 1 Application topically daily. 60 g 1   levothyroxine (SYNTHROID) 88 MCG tablet TAKE 1 TABLET(88 MCG) BY MOUTH DAILY 90 tablet 3   metoprolol tartrate (LOPRESSOR) 50 MG tablet TAKE 1 TABLET 2 HOURS PRIOR TO TEST 1 tablet 0   Multiple Vitamins-Minerals (PRESERVISION AREDS 2 PO) Take by mouth.     nitroGLYCERIN (NITROSTAT) 0.4 MG SL tablet DISSOLVE 1 TABLET UNDER THE TONGUE AS DIRECTED. MAX 3 DOSES. CALL 911 IF NEEDING ALL 3 DOSES 25 tablet 2   nystatin (MYCOSTATIN) 100000 UNIT/ML suspension Take 5 mLs (500,000 Units total) by mouth 4 (four) times daily. 60 mL 0   Omega-3 Fatty Acids (FISH OIL) 1000 MG CAPS Take 1 capsule by mouth daily.     ondansetron (ZOFRAN) 4 MG tablet Take 1 tablet (4 mg  total) by mouth every 8 (eight) hours as needed for nausea or vomiting. 30 tablet 2   phenytoin (DILANTIN) 100 MG ER capsule TAKE 2 CAPSULES BY MOUTH EVERY MORNING AND 1 CAPSULE BY MOUTH EVERY EVENING 270 capsule 3   phenytoin (DILANTIN) 100 MG ER capsule TAKE 2 CAPSULES BY MOUTH EVERY MORNING AND 1 CAPSULE BY MOUTH EVERY EVENING 270 capsule 3   promethazine (PHENERGAN) 12.5 MG tablet Take by mouth.     Respiratory Therapy Supplies (FLUTTER) DEVI Use after nebulization treatments 1 each 0   sodium chloride (MURO 128) 5 % ophthalmic solution Place 1 drop into both eyes as needed for irritation.     SYRINGE-NEEDLE, DISP, 3 ML (BD ECLIPSE SYRINGE) 25G X 1" 3 ML MISC Use sq q 2 wks 50 each 3   traZODone (DESYREL) 50 MG tablet TAKE 4 TABLETS(200 MG)  BY MOUTH AT BEDTIME 360 tablet 3   verapamil (CALAN-SR) 240 MG CR tablet TAKE 1 TABLET(240 MG) BY MOUTH AT BEDTIME 90 tablet 3   escitalopram (LEXAPRO) 20 MG tablet TAKE 1 TABLET(20 MG) BY MOUTH DAILY 90 tablet 3   furosemide (LASIX) 40 MG tablet Take 1 tablet (40 mg total) by mouth daily. 30 tablet 1   potassium chloride SA (KLOR-CON M) 20 MEQ tablet Take 1 tablet (20 mEq total) by mouth daily. 30 tablet 3   promethazine-dextromethorphan (PROMETHAZINE-DM) 6.25-15 MG/5ML syrup TAKE 5 ML BY MOUTH FOUR TIMES DAILY AS NEEDED FOR COUGH 240 mL 1   No facility-administered medications prior to visit.    ROS: Review of Systems  Constitutional:  Positive for fatigue. Negative for activity change, appetite change, chills and unexpected weight change.  HENT:  Negative for congestion, mouth sores and sinus pressure.   Eyes:  Negative for visual disturbance.  Respiratory:  Positive for cough and shortness of breath. Negative for chest tightness and wheezing.   Cardiovascular:  Positive for leg swelling.  Gastrointestinal:  Negative for abdominal pain and nausea.  Genitourinary:  Negative for difficulty urinating, frequency and vaginal pain.  Musculoskeletal:  Positive for arthralgias, back pain and gait problem.  Skin:  Negative for pallor and rash.  Neurological:  Negative for dizziness, tremors, weakness, numbness and headaches.  Hematological:  Bruises/bleeds easily.  Psychiatric/Behavioral:  Positive for sleep disturbance. Negative for confusion and suicidal ideas. The patient is nervous/anxious.     Objective:  BP 118/60 (BP Location: Left Arm, Patient Position: Sitting, Cuff Size: Large)   Pulse 72   Temp 97.8 F (36.6 C) (Oral)   Ht 5\' 2"  (1.575 m)   Wt 144 lb (65.3 kg)   SpO2 97%   BMI 26.34 kg/m   BP Readings from Last 3 Encounters:  08/31/22 118/60  08/12/22 120/70  08/03/22 120/82    Wt Readings from Last 3 Encounters:  08/31/22 144 lb (65.3 kg)  08/12/22 150 lb 9.6 oz  (68.3 kg)  08/03/22 151 lb (68.5 kg)    Physical Exam Constitutional:      General: She is not in acute distress.    Appearance: She is well-developed.  HENT:     Head: Normocephalic.     Right Ear: External ear normal.     Left Ear: External ear normal.     Nose: Nose normal.  Eyes:     General:        Right eye: No discharge.        Left eye: No discharge.     Conjunctiva/sclera: Conjunctivae normal.  Pupils: Pupils are equal, round, and reactive to light.  Neck:     Thyroid: No thyromegaly.     Vascular: No JVD.     Trachea: No tracheal deviation.  Cardiovascular:     Rate and Rhythm: Normal rate and regular rhythm.     Heart sounds: Normal heart sounds.  Pulmonary:     Effort: No respiratory distress.     Breath sounds: No stridor. Rhonchi present. No wheezing.  Abdominal:     General: Bowel sounds are normal. There is no distension.     Palpations: Abdomen is soft. There is no mass.     Tenderness: There is no abdominal tenderness. There is no guarding or rebound.  Musculoskeletal:        General: No tenderness.     Cervical back: Normal range of motion and neck supple. No rigidity.     Right lower leg: No edema.     Left lower leg: No edema.  Lymphadenopathy:     Cervical: No cervical adenopathy.  Skin:    Findings: No erythema or rash.  Neurological:     Mental Status: She is oriented to person, place, and time.     Cranial Nerves: No cranial nerve deficit.     Motor: Weakness present. No abnormal muscle tone.     Coordination: Coordination abnormal.     Gait: Gait abnormal.     Deep Tendon Reflexes: Reflexes normal.  Psychiatric:        Behavior: Behavior normal.        Thought Content: Thought content normal.        Judgment: Judgment normal.   Using a cane  Lab Results  Component Value Date   WBC 8.8 08/03/2022   HGB 13.7 08/03/2022   HCT 41.3 08/03/2022   PLT 325.0 08/03/2022   GLUCOSE 116 (H) 08/03/2022   CHOL 196 11/03/2021   TRIG 119  11/03/2021   HDL 63 11/03/2021   LDLDIRECT 165.0 09/26/2009   LDLCALC 112 (H) 11/03/2021   ALT 16 08/03/2022   AST 25 08/03/2022   NA 140 08/03/2022   K 2.8 (LL) 08/03/2022   CL 89 (L) 08/03/2022   CREATININE 0.71 08/03/2022   BUN 10 08/03/2022   CO2 37 (H) 08/03/2022   TSH 0.69 08/03/2022   INR 1.0 12/18/2021   HGBA1C 5.7 06/10/2021    MR Abdomen W Wo Contrast  Result Date: 12/29/2021 CLINICAL DATA:  Possible hepatic cirrhosis and indeterminate liver lesion on recent noncontrast chest CT. EXAM: MRI ABDOMEN WITHOUT AND WITH CONTRAST TECHNIQUE: Multiplanar multisequence MR imaging of the abdomen was performed both before and after the administration of intravenous contrast. CONTRAST:  7mL GADAVIST GADOBUTROL 1 MMOL/ML IV SOLN COMPARISON:  Noncontrast chest CT on 09/14/2021 FINDINGS: Lower chest: No acute findings. Hepatobiliary: A few tiny sub-cm hepatic cysts are seen in the right and left hepatic lobes. No hepatic masses identified. No definite gross morphologic findings of cirrhosis identified. Prior cholecystectomy. No evidence of biliary obstruction. Pancreas:  No mass or inflammatory changes. Spleen:  Within normal limits in size and appearance. Adrenals/Urinary Tract: No suspicious masses identified. No evidence of hydronephrosis. Stomach/Bowel: Unremarkable. Vascular/Lymphatic: No pathologically enlarged lymph nodes identified. No acute vascular findings. Other:  None. Musculoskeletal:  No suspicious bone lesions identified. IMPRESSION: Several tiny sub-cm hepatic cysts. No evidence of hepatic neoplasm, or definite imaging signs of cirrhosis. Electronically Signed   By: Danae Orleans M.D.   On: 12/29/2021 15:42    Assessment & Plan:  Problem List Items Addressed This Visit     Vitamin B12 deficiency    On B12      LOW BACK PAIN    Chronic  Cont on Tylenol #3 prn  Potential benefits of a long term opioids use as well as potential risks (i.e. addiction risk, apnea etc) and  complications (i.e. Somnolence, constipation and others) were explained to the patient and were aknowledged. Flexeril       Epilepsy (HCC)    Chronic Cont on Dilantin      Edema    Continue on Furosemide      Bronchiectasis (HCC)    Stable F/u w/Dr Hunsucker      Hypokalemia - Primary    On K dur      Relevant Orders   Comprehensive metabolic panel      Meds ordered this encounter  Medications   escitalopram (LEXAPRO) 20 MG tablet    Sig: TAKE 1 TABLET(20 MG) BY MOUTH DAILY    Dispense:  90 tablet    Refill:  3   furosemide (LASIX) 40 MG tablet    Sig: Take 1 tablet (40 mg total) by mouth daily.    Dispense:  90 tablet    Refill:  3   promethazine-dextromethorphan (PROMETHAZINE-DM) 6.25-15 MG/5ML syrup    Sig: TAKE 5 ML BY MOUTH FOUR TIMES DAILY AS NEEDED FOR COUGH    Dispense:  240 mL    Refill:  1   potassium chloride SA (KLOR-CON M) 20 MEQ tablet    Sig: Take 1 tablet (20 mEq total) by mouth daily.    Dispense:  90 tablet    Refill:  3      Follow-up: Return in about 3 months (around 12/01/2022) for a follow-up visit.  Sonda Primes, MD

## 2022-08-31 NOTE — Assessment & Plan Note (Signed)
On B12 

## 2022-08-31 NOTE — Telephone Encounter (Signed)
Tried to call Veronica [name] [phone] was on hold for 15 mins no one didn't pick call up. Will call back later.Marland KitchenRaechel Chute

## 2022-08-31 NOTE — Assessment & Plan Note (Addendum)
Stable F/u w/Dr [employer]

## 2022-08-31 NOTE — Assessment & Plan Note (Signed)
Chronic  Cont on Tylenol #3 prn  Potential benefits of a long term opioids use as well as potential risks (i.e. addiction risk, apnea etc) and complications (i.e. Somnolence, constipation and others) were explained to the patient and were aknowledged. Flexeril

## 2022-09-01 NOTE — Telephone Encounter (Signed)
Called [facility] spoke w/ [place]. She states she keep getting " Can't be Process" She will  have to contact IT department to assist. She states to call back in 48 hours.Marland KitchenRaechel Chute

## 2022-09-02 NOTE — Telephone Encounter (Signed)
Called [facility] spoke w/ rep was able to order medication. She states will be process today pt should received 5-7 days. ID # [id number]. Next refill date is 11/09/22. Notified pt status on meds.Marland KitchenRaechel Chute

## 2022-09-03 ENCOUNTER — Telehealth: Payer: Self-pay | Admitting: Primary Care

## 2022-09-09 MED ORDER — ALBUTEROL SULFATE (2.5 MG/3ML) 0.083% IN NEBU: INHALATION_SOLUTION | RESPIRATORY_TRACT | 3 refills | Status: DC

## 2022-09-09 NOTE — Telephone Encounter (Signed)
Pt calling in to get prescription filled

## 2022-09-09 NOTE — Telephone Encounter (Signed)
Called and spoke with patient. She verified that she needed to have the budesonide and albuterol solutions sent to [facility]. I advised her that I would go ahead and send in the solutions, she verbalized understanding.   Nothing further needed at time of call.

## 2022-09-10 NOTE — Telephone Encounter (Signed)
Rec'd [employer] # [phone]. Called pt inform her medicine in ready for pick-up. NDC # [id number], Lot #[id number], 3 packs of thirty = 90 pills...Veronica [name]

## 2022-09-13 ENCOUNTER — Ambulatory Visit (INDEPENDENT_AMBULATORY_CARE_PROVIDER_SITE_OTHER): Payer: Medicare Other | Admitting: Psychology

## 2022-09-13 DIAGNOSIS — F331 Major depressive disorder, recurrent, moderate: Secondary | ICD-10-CM

## 2022-09-13 NOTE — Progress Notes (Signed)
Behavioral Health Counselor/Therapist Progress Note  Patient ID: Veronica [name], MRN: [id number],    Date: 09/13/2022  Time Spent: 12:00pm-12:45pm   45 minutes   Treatment Type: Individual Therapy  Reported Symptoms: stress, loneliness  Mental Status Exam: Appearance:  Casual     Behavior: Appropriate  Motor: Normal  Speech/Language:  Normal Rate  Affect: Appropriate  Mood: normal  Thought process: normal  Thought content:   WNL  Sensory/Perceptual disturbances:   WNL  Orientation: oriented to person, place, time/date, and situation  Attention: Good  Concentration: Good  Memory: WNL  Fund of knowledge:  Good  Insight:   Good  Judgment:  Good  Impulse Control: Good   Risk Assessment: Danger to Self:  No Self-injurious Behavior: No Danger to Others: No Duty to Warn:no Physical Aggression / Violence:No  Access to Firearms a concern: No  Gang Involvement:No   Subjective: Pt present for face-to-face individual therapy via phone.  Pt consents to telehealth phone session and is aware of limitations of phone sessions. (Pt does not have access to video devices.) Location of pt: home Location of therapist: home office.  Pt talked about her health.   She ran out of her nebulizer and their is a delay in getting a refill.   Pt has had to deal with doctor's offices and pharmacy to correct the situation which has been frustrating.  Pt has not been feeling well and has had some shortness of breath and headaches.   Pt talked about feeling down bc of her health issues.  She gets down on herself.  Worked on thought reframing.  Addressed how pt can pace herself.  Worked on self compassion and coping strategies.    Pt talked about going to a neighborhood cookout.   It was good for her to have social connections.   Pt talked about her neighborhood HOA making changes that pt is not happy with.  Addressed the issues and pt feelings.   Worked on self care strategies. Provided  supportive therapy.    Interventions: Cognitive Behavioral Therapy and Interpersonal  Diagnosis:  F33.1  Plan of Care: Recommend ongoing therapy.   Pt participated in setting therapy goals.  Pt wants to have someone to talk to and improve coping skills.   Plan to continue to meet every two weeks.    Treatment Plan (Treatment Plan Target Date: 06/21/2023) Client Abilities/Strengths  Pt is bright, engaging, and motivated for therapy.   Client Treatment Preferences  Individual therapy.  Client Statement of Needs  Improve coping skills.  Symptoms  Depressed or irritable mood. Lack of energy. Feelings of hopelessness, worthlessness, or inappropriate guilt. Low self-esteem. Unresolved grief issues.   Problems Addressed  Unipolar Depression Goals 1. Alleviate depressive symptoms and return to previous level of effective functioning. 2. Appropriately grieve the loss in order to normalize mood and to return to previously adaptive level of functioning. Objective Learn and implement behavioral strategies to overcome depression. Target Date: 2023-06-21 Frequency: Biweekly  Progress: 50 Modality: individual  Related Interventions Engage the client in "behavioral activation," increasing his/her activity level and contact with sources of reward, while identifying processes that inhibit activation.  Use behavioral techniques such as instruction, rehearsal, role-playing, role reversal, as needed, to facilitate activity in the client's daily life; reinforce success. Assist the client in developing skills that increase the likelihood of deriving pleasure from behavioral activation (e.g., assertiveness skills, developing an exercise plan, less internal/more external focus, increased social involvement); reinforce success. Objective Identify important people  in life, past and present, and describe the quality, good and poor, of those relationships. Target Date: 2023-06-21 Frequency: Biweekly   Progress: 50 Modality: individual  Related Interventions Conduct Interpersonal Therapy beginning with the assessment of the client's "interpersonal inventory" of important past and present relationships; develop a case formulation linking depression to grief, interpersonal role disputes, role transitions, and/or interpersonal deficits). Objective Learn and implement problem-solving and decision-making skills. Target Date: 2023-06-21 Frequency: Biweekly  Progress: 50 Modality: individual  Related Interventions Conduct Problem-Solving Therapy using techniques such as psychoeducation, modeling, and role-playing to teach client problem-solving skills (i.e., defining a problem specifically, generating possible solutions, evaluating the pros and cons of each solution, selecting and implementing a plan of action, evaluating the efficacy of the plan, accepting or revising the plan); role-play application of the problem-solving skill to a real life issue. Encourage in the client the development of a positive problem orientation in which problems and solving them are viewed as a natural part of life and not something to be feared, despaired, or avoided. 3. Develop healthy interpersonal relationships that lead to the alleviation and help prevent the relapse of depression. 4. Develop healthy thinking patterns and beliefs about self, others, and the world that lead to the alleviation and help prevent the relapse of depression. 5. Recognize, accept, and cope with feelings of depression. Diagnosis F33.1  Conditions For Discharge Achievement of treatment goals and objectives   Salomon Fick, LCSW

## 2022-09-14 NOTE — Telephone Encounter (Signed)
Pt has picked up medication.  

## 2022-09-23 ENCOUNTER — Ambulatory Visit (INDEPENDENT_AMBULATORY_CARE_PROVIDER_SITE_OTHER): Payer: Medicare Other | Admitting: Psychology

## 2022-09-23 DIAGNOSIS — F331 Major depressive disorder, recurrent, moderate: Secondary | ICD-10-CM | POA: Diagnosis not present

## 2022-09-23 NOTE — Progress Notes (Signed)
[facility] Counselor/Therapist Progress Note  Patient ID: Veronica [name], MRN: [id number],    Date: 09/23/2022  Time Spent: 11:00am - 11:45am    45 minutes   Treatment Type: Individual Therapy  Reported Symptoms: stress, loneliness  Mental Status Exam: Appearance:  Casual     Behavior: Appropriate  Motor: Normal  Speech/Language:  Normal Rate  Affect: Appropriate  Mood: normal  Thought process: normal  Thought content:   WNL  Sensory/Perceptual disturbances:   WNL  Orientation: oriented to person, place, time/date, and situation  Attention: Good  Concentration: Good  Memory: WNL  Fund of knowledge:  Good  Insight:   Good  Judgment:  Good  Impulse Control: Good   Risk Assessment: Danger to Self:  No Self-injurious Behavior: No Danger to Others: No Duty to Warn:no Physical Aggression / Violence:No  Access to Firearms a concern: No  Gang Involvement:No   Subjective: Pt present for face-to-face individual therapy via phone.  Pt consents to telehealth phone session and is aware of limitations of phone sessions. (Pt does not have access to video devices.) Location of pt: home Location of therapist: home office.  Pt talked about her health.   She has been having bad headaches and her doctor is doing tests to rule out stroke.  Pt is worrying about her health.   She is feeling down bc of her health issues.  She gets down on herself.  Worked on thought reframing.  Addressed how pt can pace herself.  Worked on self compassion and coping strategies.    Pt talked about her self esteem.  She feels like she is not "good enough" and compares herself to others.   Worked on self esteem building.  Worked on self care strategies. Provided supportive therapy.    Interventions: Cognitive Behavioral Therapy and Interpersonal  Diagnosis:  F33.1  Plan of Care: Recommend ongoing therapy.   Pt participated in setting therapy goals.  Pt wants to have someone to talk to and  improve coping skills.   Plan to continue to meet every two weeks.    Treatment Plan (Treatment Plan Target Date: 06/21/2023) Client Abilities/Strengths  Pt is bright, engaging, and motivated for therapy.   Client Treatment Preferences  Individual therapy.  Client Statement of Needs  Improve coping skills.  Symptoms  Depressed or irritable mood. Lack of energy. Feelings of hopelessness, worthlessness, or inappropriate guilt. Low self-esteem. Unresolved grief issues.   Problems Addressed  Unipolar Depression Goals 1. Alleviate depressive symptoms and return to previous level of effective functioning. 2. Appropriately grieve the loss in order to normalize mood and to return to previously adaptive level of functioning. Objective Learn and implement behavioral strategies to overcome depression. Target Date: 2023-06-21 Frequency: Biweekly  Progress: 50 Modality: individual  Related Interventions Engage the client in "behavioral activation," increasing his/her activity level and contact with sources of reward, while identifying processes that inhibit activation.  Use behavioral techniques such as instruction, rehearsal, role-playing, role reversal, as needed, to facilitate activity in the client's daily life; reinforce success. Assist the client in developing skills that increase the likelihood of deriving pleasure from behavioral activation (e.g., assertiveness skills, developing an exercise plan, less internal/more external focus, increased social involvement); reinforce success. Objective Identify important people in life, past and present, and describe the quality, good and poor, of those relationships. Target Date: 2023-06-21 Frequency: Biweekly  Progress: 50 Modality: individual  Related Interventions Conduct Interpersonal Therapy beginning with the assessment of the client's "interpersonal inventory" of important  past and present relationships; develop a case formulation linking  depression to grief, interpersonal role disputes, role transitions, and/or interpersonal deficits). Objective Learn and implement problem-solving and decision-making skills. Target Date: 2023-06-21 Frequency: Biweekly  Progress: 50 Modality: individual  Related Interventions Conduct Problem-Solving Therapy using techniques such as psychoeducation, modeling, and role-playing to teach client problem-solving skills (i.e., defining a problem specifically, generating possible solutions, evaluating the pros and cons of each solution, selecting and implementing a plan of action, evaluating the efficacy of the plan, accepting or revising the plan); role-play application of the problem-solving skill to a real life issue. Encourage in the client the development of a positive problem orientation in which problems and solving them are viewed as a natural part of life and not something to be feared, despaired, or avoided. 3. Develop healthy interpersonal relationships that lead to the alleviation and help prevent the relapse of depression. 4. Develop healthy thinking patterns and beliefs about self, others, and the world that lead to the alleviation and help prevent the relapse of depression. 5. Recognize, accept, and cope with feelings of depression. Diagnosis F33.1  Conditions For Discharge Achievement of treatment goals and objectives   Salomon Fick, LCSW

## 2022-09-30 ENCOUNTER — Telehealth: Payer: Self-pay | Admitting: Internal Medicine

## 2022-09-30 ENCOUNTER — Other Ambulatory Visit: Payer: Self-pay | Admitting: Internal Medicine

## 2022-09-30 NOTE — Telephone Encounter (Signed)
Prescription Request  09/30/2022  LOV: 08/31/2022  What is the name of the medication or equipment? promethazine-dextromethorphan (PROMETHAZINE-DM) 6.25-15 MG/5ML syrup   Have you contacted your pharmacy to request a refill? No   Which pharmacy would you like this sent to?    [id number] - [place], [place] - [address] [place] [address] Phone: [phone] Fax: [phone]   Patient notified that their request is being sent to the clinical staff for review and that they should receive a response within 2 business days.   Please advise at Mobile [phone] (mobile)

## 2022-09-30 NOTE — Telephone Encounter (Signed)
Refill is on MD desk top to be approved. Closing this phone note.Marland KitchenRaechel Chute

## 2022-10-06 MED ORDER — PROMETHAZINE-DM 6.25-15 MG/5ML PO SYRP: ORAL_SOLUTION | ORAL | 1 refills | Status: DC

## 2022-10-11 ENCOUNTER — Ambulatory Visit (INDEPENDENT_AMBULATORY_CARE_PROVIDER_SITE_OTHER): Payer: Medicare Other | Admitting: Psychology

## 2022-10-11 DIAGNOSIS — F331 Major depressive disorder, recurrent, moderate: Secondary | ICD-10-CM | POA: Diagnosis not present

## 2022-10-11 NOTE — Progress Notes (Signed)
[facility] Counselor/Therapist Progress Note  Patient ID: GUELDA STANKUS, MRN: [id number],    Date: 10/11/2022  Time Spent: 12:00pm - 12:45pm    45 minutes   Treatment Type: Individual Therapy  Reported Symptoms: stress, loneliness  Mental Status Exam: Appearance:  Casual     Behavior: Appropriate  Motor: Normal  Speech/Language:  Normal Rate  Affect: Appropriate  Mood: normal  Thought process: normal  Thought content:   WNL  Sensory/Perceptual disturbances:   WNL  Orientation: oriented to person, place, time/date, and situation  Attention: Good  Concentration: Good  Memory: WNL  Fund of knowledge:  Good  Insight:   Good  Judgment:  Good  Impulse Control: Good   Risk Assessment: Danger to Self:  No Self-injurious Behavior: No Danger to Others: No Duty to Warn:no Physical Aggression / Violence:No  Access to Firearms a concern: No  Gang Involvement:No   Subjective: Pt present for face-to-face individual therapy via phone.  Pt consents to telehealth phone session and is aware of limitations of phone sessions. (Pt does not have access to video devices.) Location of pt: home Location of therapist: home office.  Pt talked about her health.   She was on a heart monitor.   She is scheduled to get a CT scan and MRI in August.   Pt is worried about getting the results.   She continues to have headaches.   She is feeling down bc of her health issues.  She gets down on herself.  Worked on thought reframing.  Addressed how pt can pace herself.  Worked on self compassion and coping strategies.    Pt talked about an incident when she had to travel to [place].  [place] [address] [place] dead deer.   Pt was upset and felt so badly for the animal.   Pt talked about her brother who is not doing well regarding his health.  Addressed pt's concerns about her brother.   Worked on self care strategies. Provided supportive therapy.    Interventions:  Cognitive Behavioral Therapy and Interpersonal  Diagnosis:  F33.1  Plan of Care: Recommend ongoing therapy.   Pt participated in setting therapy goals.  Pt wants to have someone to talk to and improve coping skills.   Plan to continue to meet every two weeks.    Treatment Plan (Treatment Plan Target Date: 06/21/2023) Client Abilities/Strengths  Pt is bright, engaging, and motivated for therapy.   Client Treatment Preferences  Individual therapy.  Client Statement of Needs  Improve coping skills.  Symptoms  Depressed or irritable mood. Lack of energy. Feelings of hopelessness, worthlessness, or inappropriate guilt. Low self-esteem. Unresolved grief issues.   Problems Addressed  Unipolar Depression Goals 1. Alleviate depressive symptoms and return to previous level of effective functioning. 2. Appropriately grieve the loss in order to normalize mood and to return to previously adaptive level of functioning. Objective Learn and implement behavioral strategies to overcome depression. Target Date: 2023-06-21 Frequency: Biweekly  Progress: 50 Modality: individual  Related Interventions Engage the client in "behavioral activation," increasing his/her activity level and contact with sources of reward, while identifying processes that inhibit activation.  Use behavioral techniques such as instruction, rehearsal, role-playing, role reversal, as needed, to facilitate activity in the client's daily life; reinforce success. Assist the client in developing skills that increase the likelihood of deriving pleasure from behavioral activation (e.g., assertiveness skills, developing an exercise plan, less internal/more external focus, increased social involvement); reinforce success. Objective  Identify important people in life, past and present, and describe the quality, good and poor, of those relationships. Target Date: 2023-06-21 Frequency: Biweekly  Progress: 50 Modality: individual  Related  Interventions Conduct Interpersonal Therapy beginning with the assessment of the client's "interpersonal inventory" of important past and present relationships; develop a case formulation linking depression to grief, interpersonal role disputes, role transitions, and/or interpersonal deficits). Objective Learn and implement problem-solving and decision-making skills. Target Date: 2023-06-21 Frequency: Biweekly  Progress: 50 Modality: individual  Related Interventions Conduct Problem-Solving Therapy using techniques such as psychoeducation, modeling, and role-playing to teach client problem-solving skills (i.e., defining a problem specifically, generating possible solutions, evaluating the pros and cons of each solution, selecting and implementing a plan of action, evaluating the efficacy of the plan, accepting or revising the plan); role-play application of the problem-solving skill to a real life issue. Encourage in the client the development of a positive problem orientation in which problems and solving them are viewed as a natural part of life and not something to be feared, despaired, or avoided. 3. Develop healthy interpersonal relationships that lead to the alleviation and help prevent the relapse of depression. 4. Develop healthy thinking patterns and beliefs about self, others, and the world that lead to the alleviation and help prevent the relapse of depression. 5. Recognize, accept, and cope with feelings of depression. Diagnosis F33.1  Conditions For Discharge Achievement of treatment goals and objectives   Salomon Fick, LCSW

## 2022-10-25 ENCOUNTER — Ambulatory Visit (INDEPENDENT_AMBULATORY_CARE_PROVIDER_SITE_OTHER): Payer: Medicare Other | Admitting: Psychology

## 2022-10-25 DIAGNOSIS — F331 Major depressive disorder, recurrent, moderate: Secondary | ICD-10-CM | POA: Diagnosis not present

## 2022-10-25 NOTE — Progress Notes (Signed)
[facility] Counselor/Therapist Progress Note  Patient ID: KALIJAH JARECKI, MRN: [id number],    Date: 10/25/2022  Time Spent: 12:00pm - 12:45pm    45 minutes   Treatment Type: Individual Therapy  Reported Symptoms: stress, loneliness  Mental Status Exam: Appearance:  Casual     Behavior: Appropriate  Motor: Normal  Speech/Language:  Normal Rate  Affect: Appropriate  Mood: normal  Thought process: normal  Thought content:   WNL  Sensory/Perceptual disturbances:   WNL  Orientation: oriented to person, place, time/date, and situation  Attention: Good  Concentration: Good  Memory: WNL  Fund of knowledge:  Good  Insight:   Good  Judgment:  Good  Impulse Control: Good   Risk Assessment: Danger to Self:  No Self-injurious Behavior: No Danger to Others: No Duty to Warn:no Physical Aggression / Violence:No  Access to Firearms a concern: No  Gang Involvement:No   Subjective: Pt present for face-to-face individual therapy via phone.  Pt consents to telehealth phone session and is aware of limitations of phone sessions. (Pt does not have access to video devices.) Location of pt: home Location of therapist: home office.  Pt talked about her health.   Pt had her MRI done last week and does not have the results back yet. Pt has to have a CT scan tomorrow and has to take prednisone before hand bc she is allergic to the contrast.   Pt is worried about the effects of the prednisone.  She is feeling down bc of her health issues.  She gets down on herself.  Worked on thought reframing.  Addressed how pt can pace herself.  Worked on self compassion and coping strategies.     Worked on self care strategies. Provided supportive therapy.    Interventions: Cognitive Behavioral Therapy and Interpersonal  Diagnosis:  F33.1  Plan of Care: Recommend ongoing therapy.   Pt participated in setting therapy goals.  Pt wants to have someone to talk to and improve coping skills.   Plan  to continue to meet every two weeks.    Treatment Plan (Treatment Plan Target Date: 06/21/2023) Client Abilities/Strengths  Pt is bright, engaging, and motivated for therapy.   Client Treatment Preferences  Individual therapy.  Client Statement of Needs  Improve coping skills.  Symptoms  Depressed or irritable mood. Lack of energy. Feelings of hopelessness, worthlessness, or inappropriate guilt. Low self-esteem. Unresolved grief issues.   Problems Addressed  Unipolar Depression Goals 1. Alleviate depressive symptoms and return to previous level of effective functioning. 2. Appropriately grieve the loss in order to normalize mood and to return to previously adaptive level of functioning. Objective Learn and implement behavioral strategies to overcome depression. Target Date: 2023-06-21 Frequency: Biweekly  Progress: 50 Modality: individual  Related Interventions Engage the client in "behavioral activation," increasing his/her activity level and contact with sources of reward, while identifying processes that inhibit activation.  Use behavioral techniques such as instruction, rehearsal, role-playing, role reversal, as needed, to facilitate activity in the client's daily life; reinforce success. Assist the client in developing skills that increase the likelihood of deriving pleasure from behavioral activation (e.g., assertiveness skills, developing an exercise plan, less internal/more external focus, increased social involvement); reinforce success. Objective Identify important people in life, past and present, and describe the quality, good and poor, of those relationships. Target Date: 2023-06-21 Frequency: Biweekly  Progress: 50 Modality: individual  Related Interventions Conduct Interpersonal Therapy beginning with the assessment of the client's "interpersonal inventory" of important past and present  relationships; develop a case formulation linking depression to grief,  interpersonal role disputes, role transitions, and/or interpersonal deficits). Objective Learn and implement problem-solving and decision-making skills. Target Date: 2023-06-21 Frequency: Biweekly  Progress: 50 Modality: individual  Related Interventions Conduct Problem-Solving Therapy using techniques such as psychoeducation, modeling, and role-playing to teach client problem-solving skills (i.e., defining a problem specifically, generating possible solutions, evaluating the pros and cons of each solution, selecting and implementing a plan of action, evaluating the efficacy of the plan, accepting or revising the plan); role-play application of the problem-solving skill to a real life issue. Encourage in the client the development of a positive problem orientation in which problems and solving them are viewed as a natural part of life and not something to be feared, despaired, or avoided. 3. Develop healthy interpersonal relationships that lead to the alleviation and help prevent the relapse of depression. 4. Develop healthy thinking patterns and beliefs about self, others, and the world that lead to the alleviation and help prevent the relapse of depression. 5. Recognize, accept, and cope with feelings of depression. Diagnosis F33.1  Conditions For Discharge Achievement of treatment goals and objectives   Salomon Fick, LCSW

## 2022-10-30 ENCOUNTER — Other Ambulatory Visit: Payer: Self-pay | Admitting: Cardiovascular Disease

## 2022-10-30 DIAGNOSIS — E7800 Pure hypercholesterolemia, unspecified: Secondary | ICD-10-CM

## 2022-11-03 ENCOUNTER — Encounter: Payer: Self-pay | Admitting: Internal Medicine

## 2022-11-03 ENCOUNTER — Ambulatory Visit (INDEPENDENT_AMBULATORY_CARE_PROVIDER_SITE_OTHER): Payer: Medicare Other | Admitting: Internal Medicine

## 2022-11-03 VITALS — BP 112/70 | HR 74 | Temp 97.9°F | Ht 62.0 in | Wt 153.0 lb

## 2022-11-03 DIAGNOSIS — G459 Transient cerebral ischemic attack, unspecified: Secondary | ICD-10-CM | POA: Diagnosis not present

## 2022-11-03 DIAGNOSIS — E538 Deficiency of other specified B group vitamins: Secondary | ICD-10-CM | POA: Diagnosis not present

## 2022-11-03 DIAGNOSIS — I25111 Atherosclerotic heart disease of native coronary artery with angina pectoris with documented spasm: Secondary | ICD-10-CM | POA: Diagnosis not present

## 2022-11-03 DIAGNOSIS — I10 Essential (primary) hypertension: Secondary | ICD-10-CM

## 2022-11-03 DIAGNOSIS — E559 Vitamin D deficiency, unspecified: Secondary | ICD-10-CM | POA: Diagnosis not present

## 2022-11-03 DIAGNOSIS — E890 Postprocedural hypothyroidism: Secondary | ICD-10-CM

## 2022-11-03 DIAGNOSIS — E162 Hypoglycemia, unspecified: Secondary | ICD-10-CM

## 2022-11-03 DIAGNOSIS — R635 Abnormal weight gain: Secondary | ICD-10-CM

## 2022-11-03 MED ORDER — ACETAMINOPHEN-CODEINE 300-30 MG PO TABS: 1.0000 | ORAL_TABLET | Freq: Four times a day (QID) | ORAL | 3 refills | Status: DC | PRN

## 2022-11-03 MED ORDER — ALBUTEROL SULFATE HFA 108 (90 BASE) MCG/ACT IN AERS: 2.0000 | INHALATION_SPRAY | RESPIRATORY_TRACT | 11 refills | Status: DC | PRN

## 2022-11-03 MED ORDER — PROMETHAZINE-DM 6.25-15 MG/5ML PO SYRP: ORAL_SOLUTION | ORAL | 1 refills | Status: DC

## 2022-11-03 NOTE — Assessment & Plan Note (Signed)
(-)   work up w/[facility] - CT, CTA. Cardiac [facility] is pending

## 2022-11-03 NOTE — Assessment & Plan Note (Signed)
Chronic  ?On Vit D ?

## 2022-11-03 NOTE — Progress Notes (Signed)
Subjective:  Patient ID: Veronica [name], female    DOB: 30-Jun-1950  Age: 72 y.o. MRN: [id number]  CC: Follow-up (3 mnth f/u)   HPI AISIA MCCARTY presents for migraines, ?TIA, seizure disorder Pt had a CTA/CT at [facility] on 10/26/22. She had to be premedicated w/steroids - had side effects... " CT BRAIN WITH AND WITHOUT CONTRAST, CT ANGIOGRAPHY OF NECK AND HEAD, 10/26/2022 11:06 AM  INDICATION: suspected stroke; increased LHB weakness and numbness, Stroke/TIA, determine embolic source, Personal history of transient ischemic attack (TIA), and cerebral infarction without residual deficits \ Z86.73 Personal history of transient ischemic attack (TIA), and cerebral infarction without residual deficits \ R53.1 Weakness suspected stroke; increased LHB weakness and numbness  COMPARISON: Multiple prior studies including MRI brain 10/20/2022  TECHNIQUE: Scout and pre-contrast axial images were first obtained followed by a small test bolus of contrast for CTA timing purposes. Thereafter, a full dose of iodinated contrast was administered intravenously by rapid injection, with further multi-slice axial sections acquired in the arterial phase from the aortic arch to the cranial vertex. Image post-processing was then performed using 3D techniques (for example, MIP or 3D surface-rendered) to create reformatted images for comprehensive analysis and diagnosis of the intracranial circulation including the Circle of Willis. Any stenosis reported is calculated using the estimated diameter of the distal normal vessel (e.g., ICA) in the denominator.  All CT scans at [facility] and [facility] are performed using radiation dose optimization techniques as appropriate to a performed exam, including but not limited to one or more of the following: automatic exposure control, adjustment of the mA and/or kV according to patient size, use of iterative reconstruction technique. In  addition, our institution participates in a radiation dose monitoring program to optimize patient radiation exposure.   FINDINGS:  CT HEAD:  Calvarium/skull base: Postsurgical changes of left frontoparietal craniotomy. No evidence of acute fracture or destructive lesion. Mastoids and middle ears demonstrate no substantial mucosal disease. Lens replacements. Right TMJ chondrocalcinosis.  Paranasal sinuses: No air fluid levels. Mucosal thickening in the left sphenoid sinus.  Brain: Similar encephalomalacia in the left frontal lobe related to reported remote AVM resection. No acute large vascular territory infarct. No mass effect. No hydrocephalus. No acute hemorrhage. Intracranial atherosclerosis.   CTA NECK:  Aortic arch: Thoracic aortic calcifications with mixed plaque at the aortic branch vessels without significant stenosis (50% or greater) of the visualized origins of the major branch vessels.  Left carotid system: Mixed plaque primarily at the proximal internal carotid artery without occlusion or significant stenosis (50% or greater).  Right carotid system: Mild luminal irregularity of the right internal carotid artery at the level of C2 (304/303). No occlusion or significant stenosis (50% or greater).  Vertebral arteries: Codominant. No occlusion or significant stenosis (50% or greater). Remote trauma versus incomplete osseous bridging between the right first and second ribs.  Additional comments: Chronic atlantodental degenerative changes.  CTA HEAD:  Anterior circulation: No occlusion or significant stenosis (50% or greater). No aneurysm.  Posterior circulation: No occlusion or significant stenosis (50% or greater). No aneurysm.  Venous structures: Opacified intracranial venous structures appear patent.  Additional comments: Thyroidectomy changes.  IMPRESSION: 1. No acute intracranial abnormality and no acute arterial abnormality in the head or neck. 2. Mild luminal  irregularity of the right internal carotid artery at the level of C2 could reflect fibromuscular dysplasia in the absence of recent trauma.   Imaging Results - CT Angio Head And  Neck (10/26/2022 11:06 AM EDT) Authorizing Provider Result Type  Dennard Nip MD [facility] IMG CT PROCEDURES   " Per hx: "History: The patient presents today for procedure but noties acute deficit when she checks in to the clinic that has been going on for 1 week and so clinic visit and evaluation for possible stroke were performed.  Veronica [name] is a 72 y.o. 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 Symptoms are consistent with recrudescence of old stroke with recent illness vs new ischemic infarct near old ischemic infarct in L MCA region.  Plan: - Risk stratification labs: HbA1c, Fasting Lipids - Imaging: - MRI brain w/ and w/o contrast  - CTA head and neck - TTE to evaluate cardiac sources - Zio patch ordered  - Secondary stroke prevention: Continue aspirin 81 mg orally every day and increased atorvastatin to 80mg  daily - Therapy consults: Discussed Physical therapy but patient denied   HPI    Chief Complaint: Stroke-like symptoms  Veronica [name] is a 72 y.o. 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 She states that she noticed the numbness to her right face near her nose about a week ago. She has some numbness of her face and cheek/chin at baseline but not to that  extent. Also says that she has a facial droop at baseline. She has also noted her hemibody weakness has been worse in the past week.   She had a migraine a week ago and   Code Stroke Metrics: Arrival at clinic: Date: 09/22/2022 Time: 1402   LKN: Date: 09/15/2022  Neurology Provider at Bedside Date: 09/22/2022 Time: 1405   Duration of Symptoms: > 60 minutes Have the symptoms resolved? No  Was the patient taking anti-platelets or anti-coagulation at home? Yes, ASA   Was the patient taking a statin at home? Yes, Lipitor (atorvastatin)  Was the patient taking diabetes medication at home? No  Ambulatory status prior to event: ambulatory with assistance (cane) Degree of disability prior to event: slight Prior to presentation Modified Rankin Scale (mRS) 1 = No significant disability despite symptoms; able to carry out all ususal duties and activities  Allergies  Allergen Reactions  Amantadine Other (See Comments)  Cephalexin Other (See Comments)  UNKNOWN  Ciprofloxacin Other (See Comments)  UNKNOWN  Clindamycin Swelling  Swelling of throat, blisters  Clindamycin Hcl Other (See Comments)  severe allergic reaction  Cyproheptadine Other (See Comments)  Dextromethorphan-Guaifenesin Itching  Doxycycline Other (See Comments)  Fluconazole Itching  Fluoxetine Other (See Comments)  Fluticasone Propion-Salmeterol Other (See Comments)  UNKNOWN  Imipramine Pamoate Other (See Comments)  UNKNOWN  Lansoprazole Other (See Comments)  Milk Other (See Comments)  unknownj  Mirtazapine Other (See Comments)  Nightmares  Montelukast Other (See Comments)  Salmeterol Other (See Comments)  UNKNOWN  Telithromycin Other (See Comments)  UNKNOWN  Topiramate Other (See Comments)  fungal infection  Venlafaxine Other (See Comments)  Hypertension  Zolpidem Other (See Comments)  Azithromycin Rash  Erythromycin  Base Rash  Hydroxyzine Rash  Iodinated Contrast Media Rash and Respiratory Distress   Latex Rash  Levothyroxine Rash  Metoclopramide Rash and Respiratory Distress  Metronidazole Rash  Moxifloxacin Rash  Penicillin G Potassium Rash  Tetracycline Rash  Valproic Acid Rash  Vilazodone Rash   Continuous Infusions: No current facility-administered medications for this visit.  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)  Glaucoma 12/30/2010  Hemiparesis affecting right side as late effect of cerebrovascular accident (CMS/HCC) 12/30/2010  History of gastroesophageal reflux (GERD) 12/30/2010  Hyperlipidemia 12/30/2010  Hypertension  Hypoglycemia  Irritable bowel syndrome  Migraine  Neurogenic bladder 12/30/2010  Restless legs syndrome 12/30/2010  Seizure disorder (CMS/HCC) 12/30/2010  Spinal stenosis 12/30/2010  Thyroid nodule  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   Past Surgical History:  Procedure Laterality Date  AV FISTULA REPAIR  Procedure: AV FISTULA REPAIR  CATARACT EXTRACTION Bilateral  Procedure: CATARACT EXTRACTION  CHOLECYSTECTOMY  Procedure: CHOLECYSTECTOMY  COLONOSCOPY  Procedure: COLONOSCOPY  ENDOSCOPY  Procedure: ENDOSCOPY  FUNDOPLASTY TRANSTHORACIC  Procedure: FUNDOPLASTY TRANSTHORACIC  LAPAROSCOPIC ENDOMETRIOSIS FULGURATION  Procedure: LAPAROSCOPIC ENDOMETRIOSIS FULGURATION  OTHER SURGICAL HISTORY  Procedure: OTHER SURGICAL HISTORY (AVM resection)  OVARIAN CYST DRAINAGE  Procedure: OVARIAN CYST DRAINAGE  TEMPOROMANDIBULAR JOINT SURGERY  Procedure: TEMPOROMANDIBULAR JOINT SURGERY  UTERINE FIBROID  SURGERY  Procedure: UTERINE FIBROID SURGERY  WISDOM TOOTH EXTRACTION  Procedure: WISDOM TOOTH EXTRACTION   Family History  Problem Relation Name Age of Onset  Migraines Mother  Hypertension Mother  Heart disease Mother  Heart failure Mother  Diabetes Brother  Hypertension Brother  Sleep apnea Brother  Cancer Father  pancreATIC  Cancer Sister  colon  Migraines Maternal Aunt  Dementia Maternal Aunt  Seizures Maternal Uncle  Diabetes Paternal Aunt  Stroke Maternal Grandmother cerebral hemorrhage  Migraines Cousin  Diabetes Cousin   Social History   Socioeconomic History  Marital status: Married  Spouse name: Not on file  Number of children: Not on file  Years of education: Not on file  Highest education level: Not on file  Occupational History  Not on file  Tobacco Use  Smoking status: Never  Smokeless tobacco: Never  Substance and Sexual Activity  Alcohol use: No  Drug use: No  Sexual activity: Not on file  Other Topics Concern  Not on file  Social History Narrative  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   Social Determinants of Health   Food Insecurity: No Food Insecurity (04/16/2022)  Received from [facility], [place]  Hunger Vital Sign  Worried About Running Out of Food in the Last Year: Never true  Ran Out of Food in the Last Year: Never true  Transportation Needs: No Transportation Needs (04/16/2022)  Received from [facility], [place]  [facility] - Transportation  Lack of Transportation (Medical): No  Lack of Transportation (Non-Medical): No  Safety: Not At Risk (04/16/2022)  Received from [facility], [place]  IPV  Fear of Current or Ex-Partner: No  Emotionally Abused: No  Physically Abused: No  Sexually Abused: No  Living Situation: Not on file   ROS   Pertinent items are noted in HPI.   Objective  Temp: [98.7 F (37.1 C)] 98.7 F (37.1 C) Heart Rate: [88] 88 BP: (138)/(70) 138/70   Wt Readings from Last 3  Encounters:  11/04/21 66.9 kg (147 lb 6.4 oz)  08/12/21 67.2 kg (148 lb 1.6 oz)  04/29/21 64.8 kg (142 lb 14.4 oz)    Physical exam: Physical Exam:  GENERAL: alert, no acute distress HEENT: Moist-mucous membranes. CARDIOVASCULAR: pulses palpable distally RESPIRATORY: normal WOB on RA MUSCULOSKELETAL: no edema PSYCHIATRIC: normal affect  NEUROLOGIC MENTAL STATUS: awake, alert, attentive, cooperative. Oriented to person, place, time, situation. Follows central and peripheral commands that cross midline. Remote and recent memory intact.  SPEECH AND LANGUAGE: Fluent. No aphasia.  CRANIAL NERVES: 2 - PERRLA. VF intact.  3/4/6 - EOM full and without nystagmus 5 - sensation intact to light touch of LHB and face but decreased on V2-3 distribution on right 7 - lower face droop on right  8 - Respond to voice 9/10 - symmetric palate and uvula elevation  11 - head midline but decreased right shoulder shrug 12 - tongue midline  MOTOR: full strength 5/5 throughout LHB and 4-/5 in RUE but 3/5 in RLE.   SENSATION: slightly decreased on right face and RUE. No extinction with DSS.  COORDINATION: Intact finger-to-nose and heel-to-shin. no tremor.   GAIT: routine gait with leaning toward right on cane.  NIH Stroke Scale  1a Level of consciousness: 0=alert; keenly responsive  1b. LOC questions: 0 = Answers both questions correctly  1c. LOC commands: 0=Performs both tasks correctly  2. Best Gaze: 0=normal  3. Visual: 0=No visual loss  4. Facial Palsy: 2=Partial paralysis (total or near total paralysis of the lower face)  5a. Motor left arm: 0=No drift, limb holds 90 (or 45) degrees for full 10 seconds  5b. Motor right arm: 1=Drift, limb holds 90 (or 45) degrees but drifts down before full 10 seconds: does not hit bed  6a. motor left leg: 0=No drift, limb holds 90 (or 45) degrees for full 10 seconds  6b Motor right leg: 1=Drift, limb holds 90 (or 45) degrees but drifts down before full 10  seconds: does not hit bed  7. Limb Ataxia: 0=Absent  8. Sensory: 1=Mild to moderate sensory loss; patient feels pinprick is less sharp or is dull on the affected side; there is a loss of superficial pain with pinprick but patient is aware She is being touched  9. Best Language: 0=No aphasia, normal  10. Dysarthria: 0=Normal  11. Extinction and Inattention: 0=No abnormality  Total: 5   I have seen and examined the patient with the trainee and agree with the history, physical exam, assessment, and plan as above. I have independently reviewed the labs and medications. I have also independently reviewed strict precautions with the patient. She is to notify us immediately with any focal change in her symptoms. We will attempt stroke workup as an outpatient. I have spent 50 minutes with the patient and 40 minutes were spent in counseling and coordination of care. This time includes but is not limited to: direct patient care, reviewing pertinent ancillary tests, documentation, review of electronic medical records and conversations with family members and consultants and greater than 50% of which time was spent in counseling and coordination of care. This does not include time spent on her procedure.  Electronically signed by: Dennard Nip, MD 09/23/2022 9:54 AM  Associate Professor of Neurology"   Outpatient Medications Prior to Visit  Medication Sig Dispense Refill  . albuterol (PROVENTIL) (2.5 MG/3ML) 0.083% nebulizer  solution USE 1 VIAL IN NEBULIZER 2 TIMES DAILY. Generic: VENTOLIN 180 mL 3  . arformoterol (BROVANA) 15 MCG/2ML NEBU Take 2 mLs (15 mcg total) by nebulization 2 (two) times daily. 120 mL 11  . aspirin 81 MG chewable tablet Chew by mouth daily.    [name] atorvastatin (LIPITOR) 40 MG tablet TAKE 1 TABLET BY MOUTH EVERY DAY 90 tablet 1  . budesonide (PULMICORT) 0.5 MG/2ML nebulizer solution USE 1 VIAL IN NEBULIZER IN THE MORNING AND AT BEDTIME. Generic: Pulmicort 120 mL 11  . calcium carbonate  (OSCAL) 1500 (600 Ca) MG TABS tablet Take 600 mg of elemental calcium by mouth 2 (two) times daily with a meal.     . cariprazine (VRAYLAR) 1.5 MG capsule Take 1 capsule (1.5 mg total) by mouth daily. [employer] @ [phone] Lot # [id number] Exp- [id number] 90 capsule 0  . chlorhexidine (PERIDEX) 0.12 % solution Use to wash mouth twice a day    . Cholecalciferol (VITAMIN D3) 50 MCG (2000 UT) capsule Take 1 capsule (2,000 Units total) by mouth daily. 100 capsule 3  . clonazePAM (KLONOPIN) 1 MG tablet TAKE 2 TABLETS BY MOUTH EVERY NIGHT AT BEDTIME AND 1/2 TABLET EXTRA IN DAYTIME AS NEEDED 225 tablet 1  . cyanocobalamin (VITAMIN B12) 1000 MCG/ML injection INJECT IN THE MUSCLE EVERY 14 DAYS. 10 mL 5  . diphenoxylate-atropine (LOMOTIL) 2.5-0.025 MG tablet Take 1 tablet by mouth 4 (four) times daily as needed for diarrhea or loose stools. 60 tablet 2  . Docusate Calcium (STOOL SOFTENER PO) Take 100 mg by mouth as needed (for constipation).    [name] escitalopram (LEXAPRO) 20 MG tablet TAKE 1 TABLET(20 MG) BY MOUTH DAILY 90 tablet 3  . ezetimibe (ZETIA) 10 MG tablet TAKE 1 TABLET(10 MG) BY MOUTH DAILY 90 tablet 1  . furosemide (LASIX) 40 MG tablet Take 1 tablet (40 mg total) by mouth daily. 90 tablet 3  . Insulin Syringe-Needle U-100 (B-D INSULIN SYRINGE 1CC/25GX1") 25G X 1" 1 ML MISC USE AS DIRECTED EVERY 2 WEEKS 50 each 2  . isosorbide mononitrate (IMDUR) 30 MG 24 hr tablet Take 1 tablet (30 mg total) by mouth daily. 90 tablet 3  . ketoconazole (NIZORAL) 2 % cream Apply 1 Application topically daily. 60 g 1  . levothyroxine (SYNTHROID) 88 MCG tablet TAKE 1 TABLET(88 MCG) BY MOUTH DAILY 90 tablet 3  . metoprolol tartrate (LOPRESSOR) 50 MG tablet TAKE 1 TABLET 2 HOURS PRIOR TO TEST 1 tablet 0  . Multiple Vitamins-Minerals (PRESERVISION AREDS 2 PO) Take by mouth.    . nitroGLYCERIN (NITROSTAT) 0.4 MG SL tablet DISSOLVE 1 TABLET UNDER THE TONGUE AS DIRECTED. MAX 3 DOSES. CALL 911 IF NEEDING ALL 3 DOSES 25  tablet 2  . nystatin (MYCOSTATIN) 100000 UNIT/ML suspension Take 5 mLs (500,000 Units total) by mouth 4 (four) times daily. 60 mL 0  . Omega-3 Fatty Acids (FISH OIL) 1000 MG CAPS Take 1 capsule by mouth daily.    . ondansetron (ZOFRAN) 4 MG tablet Take 1 tablet (4 mg total) by mouth every 8 (eight) hours as needed for nausea or vomiting. 30 tablet 2  . phenytoin (DILANTIN) 100 MG ER capsule TAKE 2 CAPSULES BY MOUTH EVERY MORNING AND 1 CAPSULE BY MOUTH EVERY EVENING 270 capsule 3  . phenytoin (DILANTIN) 100 MG ER capsule TAKE 2 CAPSULES BY MOUTH EVERY MORNING AND 1 CAPSULE BY MOUTH EVERY EVENING 270 capsule 3  . potassium chloride SA (KLOR-CON M) 20 MEQ tablet Take 1 tablet (  20 mEq total) by mouth daily. 90 tablet 3  . promethazine (PHENERGAN) 12.5 MG tablet Take by mouth.    . Respiratory Therapy Supplies (FLUTTER) DEVI Use after nebulization treatments 1 each 0  . sodium chloride (MURO 128) 5 % ophthalmic solution Place 1 drop into both eyes as needed for irritation.    . SYRINGE-NEEDLE, DISP, 3 ML (BD ECLIPSE SYRINGE) 25G X 1" 3 ML MISC Use sq q 2 wks 50 each 3  . traZODone (DESYREL) 50 MG tablet TAKE 4 TABLETS(200 MG) BY MOUTH AT BEDTIME 360 tablet 3  . verapamil (CALAN-SR) 240 MG CR tablet TAKE 1 TABLET(240 MG) BY MOUTH AT BEDTIME 90 tablet 3  . acetaminophen-codeine (TYLENOL #3) 300-30 MG tablet Take 1 tablet by mouth every 6 (six) hours as needed. 60 tablet 3  . albuterol (VENTOLIN HFA) 108 (90 Base) MCG/ACT inhaler INHALE 2 PUFFS INTO THE LUNGS EVERY 4 HOURS AS NEEDED FOR WHEEZING OR SHORTNESS OF BREATH 18 g 11  . promethazine-dextromethorphan (PROMETHAZINE-DM) 6.25-15 MG/5ML syrup TAKE 5 ML BY MOUTH FOUR TIMES DAILY AS NEEDED FOR COUGH 240 mL 1   No facility-administered medications prior to visit.    ROS: Review of Systems  Constitutional:  Negative for activity change, appetite change, chills, fatigue and unexpected weight change.  HENT:  Negative for congestion, mouth sores and  sinus pressure.   Eyes:  Negative for visual disturbance.  Respiratory:  Negative for cough and chest tightness.   Gastrointestinal:  Negative for abdominal pain and nausea.  Genitourinary:  Negative for difficulty urinating, frequency and vaginal pain.  Musculoskeletal:  Positive for gait problem. Negative for arthralgias and back pain.  Skin:  Negative for pallor and rash.  Neurological:  Negative for dizziness, tremors, weakness, numbness and headaches.  Psychiatric/Behavioral:  Negative for confusion, decreased concentration, sleep disturbance and suicidal ideas.     Objective:  BP 112/70 (BP Location: Right Arm, Patient Position: Sitting, Cuff Size: Normal)   Pulse 74   Temp 97.9 F (36.6 C) (Oral)   Ht 5\' 2"  (1.575 m)   Wt 153 lb (69.4 kg)   SpO2 97%   BMI 27.98 kg/m   BP Readings from Last 3 Encounters:  11/03/22 112/70  08/31/22 118/60  08/12/22 120/70    Wt Readings from Last 3 Encounters:  11/03/22 153 lb (69.4 kg)  08/31/22 144 lb (65.3 kg)  08/12/22 150 lb 9.6 oz (68.3 kg)    Physical Exam Constitutional:      General: She is not in acute distress.    Appearance: Normal appearance. She is well-developed.  HENT:     Head: Normocephalic.     Right Ear: External ear normal.     Left Ear: External ear normal.     Nose: Nose normal.  Eyes:     General:        Right eye: No discharge.        Left eye: No discharge.     Conjunctiva/sclera: Conjunctivae normal.     Pupils: Pupils are equal, round, and reactive to light.  Neck:     Thyroid: No thyromegaly.     Vascular: No JVD.     Trachea: No tracheal deviation.  Cardiovascular:     Rate and Rhythm: Normal rate and regular rhythm.     Heart sounds: Normal heart sounds.  Pulmonary:     Effort: No respiratory distress.     Breath sounds: No stridor. No wheezing.  Abdominal:     General: Bowel sounds  are normal. There is no distension.     Palpations: Abdomen is soft. There is no mass.     Tenderness:  There is no abdominal tenderness. There is no guarding or rebound.  Musculoskeletal:        General: No tenderness.     Cervical back: Normal range of motion and neck supple. No rigidity.     Right lower leg: No edema.     Left lower leg: No edema.  Lymphadenopathy:     Cervical: No cervical adenopathy.  Skin:    Findings: No erythema or rash.  Neurological:     Mental Status: She is oriented to person, place, and time.     Cranial Nerves: No cranial nerve deficit.     Motor: No abnormal muscle tone.     Coordination: Coordination normal.     Gait: Gait abnormal.     Deep Tendon Reflexes: Reflexes normal.  Psychiatric:        Behavior: Behavior normal.        Thought Content: Thought content normal.        Judgment: Judgment normal.  Using a cane  Lab Results  Component Value Date   WBC 8.8 08/03/2022   HGB 13.7 08/03/2022   HCT 41.3 08/03/2022   PLT 325.0 08/03/2022   GLUCOSE 87 08/31/2022   CHOL 196 11/03/2021   TRIG 119 11/03/2021   HDL 63 11/03/2021   LDLDIRECT 165.0 09/26/2009   LDLCALC 112 (H) 11/03/2021   ALT 14 08/31/2022   AST 22 08/31/2022   NA 139 08/31/2022   K 3.8 08/31/2022   CL 96 08/31/2022   CREATININE 0.77 08/31/2022   BUN 11 08/31/2022   CO2 31 08/31/2022   TSH 0.69 08/03/2022   INR 1.0 12/18/2021   HGBA1C 5.7 06/10/2021    MR Abdomen W Wo Contrast  Result Date: 12/29/2021 CLINICAL DATA:  Possible hepatic cirrhosis and indeterminate liver lesion on recent noncontrast chest CT. EXAM: MRI ABDOMEN WITHOUT AND WITH CONTRAST TECHNIQUE: Multiplanar multisequence MR imaging of the abdomen was performed both before and after the administration of intravenous contrast. CONTRAST:  7mL GADAVIST GADOBUTROL 1 MMOL/ML IV SOLN COMPARISON:  Noncontrast chest CT on 09/14/2021 FINDINGS: Lower chest: No acute findings. Hepatobiliary: A few tiny sub-cm hepatic cysts are seen in the right and left hepatic lobes. No hepatic masses identified. No definite gross  morphologic findings of cirrhosis identified. Prior cholecystectomy. No evidence of biliary obstruction. Pancreas:  No mass or inflammatory changes. Spleen:  Within normal limits in size and appearance. Adrenals/Urinary Tract: No suspicious masses identified. No evidence of hydronephrosis. Stomach/Bowel: Unremarkable. Vascular/Lymphatic: No pathologically enlarged lymph nodes identified. No acute vascular findings. Other:  None. Musculoskeletal:  No suspicious bone lesions identified. IMPRESSION: Several tiny sub-cm hepatic cysts. No evidence of hepatic neoplasm, or definite imaging signs of cirrhosis. Electronically Signed   By: Danae Orleans M.D.   On: 12/29/2021 15:42    Assessment & Plan:   Problem List Items Addressed This Visit     Hypoglycemia, unspecified   Relevant Orders   Hemoglobin A1c   Vitamin B12 deficiency - Primary    On B12      Vitamin D deficiency    Chronic  On Vit D      Essential hypertension   Weight gain    Overall stable wt      Hypothyroidism   Relevant Orders   TSH   T4, free   Coronary artery disease     Cont  on Atorvastatin and Zetia. Unable to tolerate higher doses       Relevant Orders   Comprehensive metabolic panel   CBC with Differential/Platelet   Lipid panel   Urinalysis   TSH   T4, free   Hemoglobin A1c   TIA (transient ischemic attack)    (-) work up w/[facility] - CT, CTA. [facility] is pending      Relevant Orders   Comprehensive metabolic panel   CBC with Differential/Platelet   Lipid panel   Urinalysis   TSH   T4, free   Hemoglobin A1c      Meds ordered this encounter  Medications  . albuterol (VENTOLIN HFA) 108 (90 Base) MCG/ACT inhaler    Sig: Inhale 2 puffs into the lungs every 4 (four) hours as needed for wheezing or shortness of breath.    Dispense:  18 g    Refill:  11  . acetaminophen-codeine (TYLENOL #3) 300-30 MG tablet    Sig: Take 1 tablet by mouth every 6 (six) hours as needed.    Dispense:   60 tablet    Refill:  3  . promethazine-dextromethorphan (PROMETHAZINE-DM) 6.25-15 MG/5ML syrup    Sig: TAKE 5 ML BY MOUTH FOUR TIMES DAILY AS NEEDED FOR COUGH    Dispense:  240 mL    Refill:  1      Follow-up: Return in about 3 months (around 02/03/2023) for a follow-up visit.  Sonda Primes, MD

## 2022-11-03 NOTE — Assessment & Plan Note (Addendum)
Cont on Atorvastatin and Zetia. Unable to tolerate higher doses

## 2022-11-03 NOTE — Assessment & Plan Note (Signed)
On B12 

## 2022-11-03 NOTE — Assessment & Plan Note (Signed)
Overall stable wt

## 2022-11-08 ENCOUNTER — Ambulatory Visit (INDEPENDENT_AMBULATORY_CARE_PROVIDER_SITE_OTHER): Payer: Medicare Other | Admitting: Psychology

## 2022-11-08 DIAGNOSIS — F331 Major depressive disorder, recurrent, moderate: Secondary | ICD-10-CM

## 2022-11-08 NOTE — Progress Notes (Signed)
[facility] Counselor/Therapist Progress Note  Patient ID: Veronica [name], MRN: [id number],    Date: 11/08/2022  Time Spent: 12:00pm - 12:45pm    45 minutes   Treatment Type: Individual Therapy  Reported Symptoms: stress, loneliness  Mental Status Exam: Appearance:  Casual     Behavior: Appropriate  Motor: Normal  Speech/Language:  Normal Rate  Affect: Appropriate  Mood: normal  Thought process: normal  Thought content:   WNL  Sensory/Perceptual disturbances:   WNL  Orientation: oriented to person, place, time/date, and situation  Attention: Good  Concentration: Good  Memory: WNL  Fund of knowledge:  Good  Insight:   Good  Judgment:  Good  Impulse Control: Good   Risk Assessment: Danger to Self:  No Self-injurious Behavior: No Danger to Others: No Duty to Warn:no Physical Aggression / Violence:No  Access to Firearms a concern: No  Gang Involvement:No   Subjective: Pt present for face-to-face individual therapy via phone.  Pt consents to telehealth phone session and is aware of limitations of phone sessions. (Pt does not have access to video devices.) Location of pt: home Location of therapist: home office.  Pt talked about her health.   Pt had a mini stroke (TIA).  The CT and MRI confirmed that pt had a TIA.   She has numbness in her face.  She feels more tired than usual.  She is worried about her health.  She is feeling down bc of her health issues.  She gets down on herself.  Worked on thought reframing.  Addressed how pt can pace herself.  Worked on self compassion and coping strategies.     Pt talked about thinking about the past and things that she has guilt feelings about.  Addressed the issues and helped pt process her feelings.   Worked on self care strategies. Provided supportive therapy.    Interventions: Cognitive Behavioral Therapy and Interpersonal  Diagnosis:  F33.1  Plan of Care: Recommend ongoing therapy.   Pt participated in  setting therapy goals and agrees with treatment plan.   Pt wants to have someone to talk to and improve coping skills.   Plan to continue to meet every two weeks.    Treatment Plan (Treatment Plan Target Date: 06/21/2023) Client Abilities/Strengths  Pt is bright, engaging, and motivated for therapy.   Client Treatment Preferences  Individual therapy.  Client Statement of Needs  Improve coping skills.  Symptoms  Depressed or irritable mood. Lack of energy. Feelings of hopelessness, worthlessness, or inappropriate guilt. Low self-esteem. Unresolved grief issues.   Problems Addressed  Unipolar Depression Goals 1. Alleviate depressive symptoms and return to previous level of effective functioning. 2. Appropriately grieve the loss in order to normalize mood and to return to previously adaptive level of functioning. Objective Learn and implement behavioral strategies to overcome depression. Target Date: 2023-06-21 Frequency: Biweekly  Progress: 50 Modality: individual  Related Interventions Engage the client in "behavioral activation," increasing his/her activity level and contact with sources of reward, while identifying processes that inhibit activation.  Use behavioral techniques such as instruction, rehearsal, role-playing, role reversal, as needed, to facilitate activity in the client's daily life; reinforce success. Assist the client in developing skills that increase the likelihood of deriving pleasure from behavioral activation (e.g., assertiveness skills, developing an exercise plan, less internal/more external focus, increased social involvement); reinforce success. Objective Identify important people in life, past and present, and describe the quality, good and poor, of those relationships. Target Date: 2023-06-21 Frequency: Biweekly  Progress: 50 Modality: individual  Related Interventions Conduct Interpersonal Therapy beginning with the assessment of the client's  "interpersonal inventory" of important past and present relationships; develop a case formulation linking depression to grief, interpersonal role disputes, role transitions, and/or interpersonal deficits). Objective Learn and implement problem-solving and decision-making skills. Target Date: 2023-06-21 Frequency: Biweekly  Progress: 50 Modality: individual  Related Interventions Conduct Problem-Solving Therapy using techniques such as psychoeducation, modeling, and role-playing to teach client problem-solving skills (i.e., defining a problem specifically, generating possible solutions, evaluating the pros and cons of each solution, selecting and implementing a plan of action, evaluating the efficacy of the plan, accepting or revising the plan); role-play application of the problem-solving skill to a real life issue. Encourage in the client the development of a positive problem orientation in which problems and solving them are viewed as a natural part of life and not something to be feared, despaired, or avoided. 3. Develop healthy interpersonal relationships that lead to the alleviation and help prevent the relapse of depression. 4. Develop healthy thinking patterns and beliefs about self, others, and the world that lead to the alleviation and help prevent the relapse of depression. 5. Recognize, accept, and cope with feelings of depression. Diagnosis F33.1  Conditions For Discharge Achievement of treatment goals and objectives   Salomon Fick, LCSW

## 2022-11-11 ENCOUNTER — Telehealth: Payer: Self-pay | Admitting: Internal Medicine

## 2022-11-11 NOTE — Telephone Encounter (Signed)
Pt called about pt assistance med  cariprazine (VRAYLAR) 1.5 MG capsule needing it to be refilled. Please advise. Pt also stated we ship it to ur office.

## 2022-11-16 NOTE — Telephone Encounter (Signed)
 Patient called back about this medication

## 2022-11-16 NOTE — Telephone Encounter (Signed)
Called [facility] at [phone] to get pts rx for cariprazine (VRAYLAR) 1.5 MG capsule refilled.  I was able to speak with Elijah Birk who has informed me that a new rx is needed to be sent in for this rx for the pt.

## 2022-11-17 ENCOUNTER — Other Ambulatory Visit: Payer: Self-pay

## 2022-11-17 MED ORDER — CARIPRAZINE HCL 1.5 MG PO CAPS: 1.5000 mg | ORAL_CAPSULE | Freq: Every day | ORAL | 0 refills | Status: DC

## 2022-11-17 NOTE — Telephone Encounter (Signed)
Printed and placed inside office box

## 2022-11-17 NOTE — Telephone Encounter (Signed)
Ok to print I will sign

## 2022-11-18 NOTE — Telephone Encounter (Signed)
Pts rx has been faxed for refill to [facility] at [phone]

## 2022-11-22 ENCOUNTER — Ambulatory Visit (INDEPENDENT_AMBULATORY_CARE_PROVIDER_SITE_OTHER): Payer: Medicare Other | Admitting: Psychology

## 2022-11-22 DIAGNOSIS — F331 Major depressive disorder, recurrent, moderate: Secondary | ICD-10-CM

## 2022-11-22 NOTE — Progress Notes (Signed)
[facility] Counselor/Therapist Progress Note  Patient ID: LUCELIA BLAIZE, MRN: [id number],    Date: 11/22/2022  Time Spent: 12:00pm - 12:45pm    45 minutes   Treatment Type: Individual Therapy  Reported Symptoms: stress, loneliness  Mental Status Exam: Appearance:  Casual     Behavior: Appropriate  Motor: Normal  Speech/Language:  Normal Rate  Affect: Appropriate  Mood: normal  Thought process: normal  Thought content:   WNL  Sensory/Perceptual disturbances:   WNL  Orientation: oriented to person, place, time/date, and situation  Attention: Good  Concentration: Good  Memory: WNL  Fund of knowledge:  Good  Insight:   Good  Judgment:  Good  Impulse Control: Good   Risk Assessment: Danger to Self:  No Self-injurious Behavior: No Danger to Others: No Duty to Warn:no Physical Aggression / Violence:No  Access to Firearms a concern: No  Gang Involvement:No   Subjective: Pt present for face-to-face individual therapy via phone.  Pt consents to telehealth phone session and is aware of limitations of phone sessions. (Pt does not have access to video devices.) Location of pt: home Location of therapist: home office.  Pt talked about her health.   She continues to have numbness in her face.  She also feels more tired than usual.  She is worried about her health.  Pt is being closely followed by her medical providers.   She is feeling down bc of her health issues.  She gets down on herself.  Worked on thought reframing.  Addressed how pt can pace herself.  Worked on self compassion and coping strategies.     Worked on self care strategies. Provided supportive therapy.    Interventions: Cognitive Behavioral Therapy and Interpersonal  Diagnosis:  F33.1  Plan of Care: Recommend ongoing therapy.   Pt participated in setting therapy goals and agrees with treatment plan.   Pt wants to have someone to talk to and improve coping skills.   Plan to continue to meet every  two weeks.    Treatment Plan (Treatment Plan Target Date: 06/21/2023) Client Abilities/Strengths  Pt is bright, engaging, and motivated for therapy.   Client Treatment Preferences  Individual therapy.  Client Statement of Needs  Improve coping skills.  Symptoms  Depressed or irritable mood. Lack of energy. Feelings of hopelessness, worthlessness, or inappropriate guilt. Low self-esteem. Unresolved grief issues.   Problems Addressed  Unipolar Depression Goals 1. Alleviate depressive symptoms and return to previous level of effective functioning. 2. Appropriately grieve the loss in order to normalize mood and to return to previously adaptive level of functioning. Objective Learn and implement behavioral strategies to overcome depression. Target Date: 2023-06-21 Frequency: Biweekly  Progress: 50 Modality: individual  Related Interventions Engage the client in "behavioral activation," increasing his/her activity level and contact with sources of reward, while identifying processes that inhibit activation.  Use behavioral techniques such as instruction, rehearsal, role-playing, role reversal, as needed, to facilitate activity in the client's daily life; reinforce success. Assist the client in developing skills that increase the likelihood of deriving pleasure from behavioral activation (e.g., assertiveness skills, developing an exercise plan, less internal/more external focus, increased social involvement); reinforce success. Objective Identify important people in life, past and present, and describe the quality, good and poor, of those relationships. Target Date: 2023-06-21 Frequency: Biweekly  Progress: 50 Modality: individual  Related Interventions Conduct Interpersonal Therapy beginning with the assessment of the client's "interpersonal inventory" of important past and present relationships; develop a case formulation linking depression to  grief, interpersonal role disputes, role  transitions, and/or interpersonal deficits). Objective Learn and implement problem-solving and decision-making skills. Target Date: 2023-06-21 Frequency: Biweekly  Progress: 50 Modality: individual  Related Interventions Conduct Problem-Solving Therapy using techniques such as psychoeducation, modeling, and role-playing to teach client problem-solving skills (i.e., defining a problem specifically, generating possible solutions, evaluating the pros and cons of each solution, selecting and implementing a plan of action, evaluating the efficacy of the plan, accepting or revising the plan); role-play application of the problem-solving skill to a real life issue. Encourage in the client the development of a positive problem orientation in which problems and solving them are viewed as a natural part of life and not something to be feared, despaired, or avoided. 3. Develop healthy interpersonal relationships that lead to the alleviation and help prevent the relapse of depression. 4. Develop healthy thinking patterns and beliefs about self, others, and the world that lead to the alleviation and help prevent the relapse of depression. 5. Recognize, accept, and cope with feelings of depression. Diagnosis F33.1  Conditions For Discharge Achievement of treatment goals and objectives   Salomon Fick, LCSW

## 2022-11-23 ENCOUNTER — Encounter: Payer: Self-pay | Admitting: Pulmonary Disease

## 2022-11-23 ENCOUNTER — Ambulatory Visit (INDEPENDENT_AMBULATORY_CARE_PROVIDER_SITE_OTHER): Payer: Medicare Other | Admitting: Pulmonary Disease

## 2022-11-23 VITALS — BP 128/68 | HR 68 | Temp 98.4°F | Ht 62.0 in | Wt 155.8 lb

## 2022-11-23 DIAGNOSIS — J4489 Other specified chronic obstructive pulmonary disease: Secondary | ICD-10-CM

## 2022-11-23 DIAGNOSIS — J479 Bronchiectasis, uncomplicated: Secondary | ICD-10-CM | POA: Diagnosis not present

## 2022-11-23 MED ORDER — REVEFENACIN 175 MCG/3ML IN SOLN: 175.0000 ug | Freq: Every day | RESPIRATORY_TRACT | Status: DC

## 2022-11-23 NOTE — Patient Instructions (Signed)
Nice to see you again  Since albuterol helps some I think adding additional medicine to help open up the airways is a good idea  I provided samples of Yupelri.  This is a nebulized medicine.  Use with her nebulizer.  Use 1 vial of Yupelri once a day in the morning.  Do not mix it with other nebulized medications.  Continue the other nebulized medications, arformoterol and budesonide, both twice a day  If you find this helpful let me know and we can try to see if we can prescribe it.  Alternative could be using DuoNebs twice a day.  These are shorter acting but if a long-acting medicine is not an option we could do this.  Return to clinic in 3 months or sooner as needed with Dr. Judeth Horn

## 2022-11-23 NOTE — Addendum Note (Signed)
Addended byClyda Greener M on: 11/23/2022 12:11 PM   Modules accepted: Orders
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
## 2022-11-29 ENCOUNTER — Other Ambulatory Visit: Payer: Self-pay

## 2022-11-29 NOTE — Telephone Encounter (Signed)
[facility] new fax number is [phone].  I was able to speak with Theda Sers at [facility] who is with the pharmacy team and give her a verbal for pts medication to be refilled. Theda Sers has informed me with the new updates to this medication pt is now allowed to get a 90 day supply at a time with 3 refills to cover her for the year. I gave Theda Sers the okay and she has put in this refill order. Theda Sers has stated the pts medication shall be delivered in the next 5-7 business days.   **I called pt and lvm to inform her that the medication will be delivered in the next 5-7 business days and that we will be calling her when it's ready for pick-up.

## 2022-12-06 ENCOUNTER — Ambulatory Visit (INDEPENDENT_AMBULATORY_CARE_PROVIDER_SITE_OTHER): Payer: Medicare Other | Admitting: Psychology

## 2022-12-06 DIAGNOSIS — F331 Major depressive disorder, recurrent, moderate: Secondary | ICD-10-CM | POA: Diagnosis not present

## 2022-12-06 NOTE — Progress Notes (Signed)
[facility] Counselor/Therapist Progress Note  Patient ID: Veronica [name], MRN: [id number],    Date: 12/06/2022  Time Spent: 12:00pm - 12:45pm    45 minutes   Treatment Type: Individual Therapy  Reported Symptoms: stress, loneliness  Mental Status Exam: Appearance:  Casual     Behavior: Appropriate  Motor: Normal  Speech/Language:  Normal Rate  Affect: Appropriate  Mood: normal  Thought process: normal  Thought content:   WNL  Sensory/Perceptual disturbances:   WNL  Orientation: oriented to person, place, time/date, and situation  Attention: Good  Concentration: Good  Memory: WNL  Fund of knowledge:  Good  Insight:   Good  Judgment:  Good  Impulse Control: Good   Risk Assessment: Danger to Self:  No Self-injurious Behavior: No Danger to Others: No Duty to Warn:no Physical Aggression / Violence:No  Access to Firearms a concern: No  Gang Involvement:No   Subjective: Pt present for face-to-face individual therapy via phone.  Pt consents to telehealth phone session and is aware of limitations of phone sessions. (Pt does not have access to video devices.) Location of pt: home Location of therapist: home office.  Pt talked about her health.    Pt has had a bad headache the past few days.  She is worried about her health.  Pt is being closely followed by her medical providers.   She is feeling down bc of her health issues.  She gets down on herself.  Worked on thought reframing.  Addressed how pt can pace herself.  Worked on self compassion and coping strategies.   Pt talked about having some bad dreams about her family.   Addressed pt's dreams and how they impacted her.    Worked on self care strategies. Provided supportive therapy.    Interventions: Cognitive Behavioral Therapy and Interpersonal  Diagnosis:  F33.1  Plan of Care: Recommend ongoing therapy.   Pt participated in setting therapy goals and agrees with treatment plan.   Pt wants to have  someone to talk to and improve coping skills.   Plan to continue to meet every two weeks.    Treatment Plan (Treatment Plan Target Date: 06/21/2023) Client Abilities/Strengths  Pt is bright, engaging, and motivated for therapy.   Client Treatment Preferences  Individual therapy.  Client Statement of Needs  Improve coping skills.  Symptoms  Depressed or irritable mood. Lack of energy. Feelings of hopelessness, worthlessness, or inappropriate guilt. Low self-esteem. Unresolved grief issues.   Problems Addressed  Unipolar Depression Goals 1. Alleviate depressive symptoms and return to previous level of effective functioning. 2. Appropriately grieve the loss in order to normalize mood and to return to previously adaptive level of functioning. Objective Learn and implement behavioral strategies to overcome depression. Target Date: 2023-06-21 Frequency: Biweekly  Progress: 50 Modality: individual  Related Interventions Engage the client in "behavioral activation," increasing his/her activity level and contact with sources of reward, while identifying processes that inhibit activation.  Use behavioral techniques such as instruction, rehearsal, role-playing, role reversal, as needed, to facilitate activity in the client's daily life; reinforce success. Assist the client in developing skills that increase the likelihood of deriving pleasure from behavioral activation (e.g., assertiveness skills, developing an exercise plan, less internal/more external focus, increased social involvement); reinforce success. Objective Identify important people in life, past and present, and describe the quality, good and poor, of those relationships. Target Date: 2023-06-21 Frequency: Biweekly  Progress: 50 Modality: individual  Related Interventions Conduct Interpersonal Therapy beginning with the assessment of the  client's "interpersonal inventory" of important past and present relationships; develop a case  formulation linking depression to grief, interpersonal role disputes, role transitions, and/or interpersonal deficits). Objective Learn and implement problem-solving and decision-making skills. Target Date: 2023-06-21 Frequency: Biweekly  Progress: 50 Modality: individual  Related Interventions Conduct Problem-Solving Therapy using techniques such as psychoeducation, modeling, and role-playing to teach client problem-solving skills (i.e., defining a problem specifically, generating possible solutions, evaluating the pros and cons of each solution, selecting and implementing a plan of action, evaluating the efficacy of the plan, accepting or revising the plan); role-play application of the problem-solving skill to a real life issue. Encourage in the client the development of a positive problem orientation in which problems and solving them are viewed as a natural part of life and not something to be feared, despaired, or avoided. 3. Develop healthy interpersonal relationships that lead to the alleviation and help prevent the relapse of depression. 4. Develop healthy thinking patterns and beliefs about self, others, and the world that lead to the alleviation and help prevent the relapse of depression. 5. Recognize, accept, and cope with feelings of depression. Diagnosis F33.1  Conditions For Discharge Achievement of treatment goals and objectives   Salomon Fick, LCSW

## 2022-12-20 ENCOUNTER — Ambulatory Visit (INDEPENDENT_AMBULATORY_CARE_PROVIDER_SITE_OTHER): Payer: Medicare Other | Admitting: Psychology

## 2022-12-20 DIAGNOSIS — F331 Major depressive disorder, recurrent, moderate: Secondary | ICD-10-CM

## 2022-12-20 NOTE — Progress Notes (Signed)
[facility] Counselor/Therapist Progress Note  Patient ID: Veronica [name], MRN: [id number],    Date: 12/20/2022  Time Spent: 12:00pm - 12:45pm    45 minutes   Treatment Type: Individual Therapy  Reported Symptoms: stress, loneliness  Mental Status Exam: Appearance:  Casual     Behavior: Appropriate  Motor: Normal  Speech/Language:  Normal Rate  Affect: Appropriate  Mood: normal  Thought process: normal  Thought content:   WNL  Sensory/Perceptual disturbances:   WNL  Orientation: oriented to person, place, time/date, and situation  Attention: Good  Concentration: Good  Memory: WNL  Fund of knowledge:  Good  Insight:   Good  Judgment:  Good  Impulse Control: Good   Risk Assessment: Danger to Self:  No Self-injurious Behavior: No Danger to Others: No Duty to Warn:no Physical Aggression / Violence:No  Access to Firearms a concern: No  Gang Involvement:No   Subjective: Pt present for face-to-face individual therapy via phone.  Pt consents to telehealth phone session and is aware of limitations of phone sessions. (Pt does not have access to video devices.) Location of pt: home Location of therapist: home office.  Pt talked about her health.    Pt sees the doctor this week to get test results.  Pt will get another injection in her occipital nerve and 4 places in her head.   She is worried about her health.  Pt is being closely followed by her medical providers.   Pt talked about losing her balance and falling one night.  She has sore ribs but did not break any bones.   She is feeling down bc of her health issues.  She gets down on herself.  Worked on thought reframing.  Addressed how pt can pace herself.  Worked on self compassion and coping strategies.    Worked on self care strategies. Provided supportive therapy.    Interventions: Cognitive Behavioral Therapy and Interpersonal  Diagnosis:  F33.1  Plan of Care: Recommend ongoing therapy.   Pt  participated in setting therapy goals and agrees with treatment plan.   Pt wants to have someone to talk to and improve coping skills.   Plan to continue to meet every two weeks.    Treatment Plan (Treatment Plan Target Date: 06/21/2023) Client Abilities/Strengths  Pt is bright, engaging, and motivated for therapy.   Client Treatment Preferences  Individual therapy.  Client Statement of Needs  Improve coping skills.  Symptoms  Depressed or irritable mood. Lack of energy. Feelings of hopelessness, worthlessness, or inappropriate guilt. Low self-esteem. Unresolved grief issues.   Problems Addressed  Unipolar Depression Goals 1. Alleviate depressive symptoms and return to previous level of effective functioning. 2. Appropriately grieve the loss in order to normalize mood and to return to previously adaptive level of functioning. Objective Learn and implement behavioral strategies to overcome depression. Target Date: 2023-06-21 Frequency: Biweekly  Progress: 50 Modality: individual  Related Interventions Engage the client in "behavioral activation," increasing his/her activity level and contact with sources of reward, while identifying processes that inhibit activation.  Use behavioral techniques such as instruction, rehearsal, role-playing, role reversal, as needed, to facilitate activity in the client's daily life; reinforce success. Assist the client in developing skills that increase the likelihood of deriving pleasure from behavioral activation (e.g., assertiveness skills, developing an exercise plan, less internal/more external focus, increased social involvement); reinforce success. Objective Identify important people in life, past and present, and describe the quality, good and poor, of those relationships. Target Date: 2023-06-21 Frequency:  Biweekly  Progress: 50 Modality: individual  Related Interventions Conduct Interpersonal Therapy beginning with the assessment of the  client's "interpersonal inventory" of important past and present relationships; develop a case formulation linking depression to grief, interpersonal role disputes, role transitions, and/or interpersonal deficits). Objective Learn and implement problem-solving and decision-making skills. Target Date: 2023-06-21 Frequency: Biweekly  Progress: 50 Modality: individual  Related Interventions Conduct Problem-Solving Therapy using techniques such as psychoeducation, modeling, and role-playing to teach client problem-solving skills (i.e., defining a problem specifically, generating possible solutions, evaluating the pros and cons of each solution, selecting and implementing a plan of action, evaluating the efficacy of the plan, accepting or revising the plan); role-play application of the problem-solving skill to a real life issue. Encourage in the client the development of a positive problem orientation in which problems and solving them are viewed as a natural part of life and not something to be feared, despaired, or avoided. 3. Develop healthy interpersonal relationships that lead to the alleviation and help prevent the relapse of depression. 4. Develop healthy thinking patterns and beliefs about self, others, and the world that lead to the alleviation and help prevent the relapse of depression. 5. Recognize, accept, and cope with feelings of depression. Diagnosis F33.1  Conditions For Discharge Achievement of treatment goals and objectives   Salomon Fick, LCSW

## 2023-01-03 ENCOUNTER — Ambulatory Visit (INDEPENDENT_AMBULATORY_CARE_PROVIDER_SITE_OTHER): Payer: Medicare Other | Admitting: Psychology

## 2023-01-03 DIAGNOSIS — F331 Major depressive disorder, recurrent, moderate: Secondary | ICD-10-CM | POA: Diagnosis not present

## 2023-01-03 NOTE — Progress Notes (Signed)
[facility] Counselor/Therapist Progress Note  Patient ID: TALI HAVENS, MRN: [id number],    Date: 01/03/2023  Time Spent: 12:00pm - 12:45pm    45 minutes   Treatment Type: Individual Therapy  Reported Symptoms: stress, loneliness  Mental Status Exam: Appearance:  Casual     Behavior: Appropriate  Motor: Normal  Speech/Language:  Normal Rate  Affect: Appropriate  Mood: normal  Thought process: normal  Thought content:   WNL  Sensory/Perceptual disturbances:   WNL  Orientation: oriented to person, place, time/date, and situation  Attention: Good  Concentration: Good  Memory: WNL  Fund of knowledge:  Good  Insight:   Good  Judgment:  Good  Impulse Control: Good   Risk Assessment: Danger to Self:  No Self-injurious Behavior: No Danger to Others: No Duty to Warn:no Physical Aggression / Violence:No  Access to Firearms a concern: No  Gang Involvement:No   Subjective: Pt present for face-to-face individual therapy via phone.  Pt consents to telehealth phone session and is aware of limitations of phone sessions. (Pt does not have access to video devices.) Location of pt: home Location of therapist: home office.  Pt talked about her health.    Pt got the results of her echo and is concerned about the findings.   She can't see the cardiologist until December.    Pt is worried about her cardiac health.  Her PCP will review the results with her at their November appointment.  Encouraged pt to contact her PCP to see if she can get an appointment sooner.   She is feeling down bc of her health issues.  She gets down on herself.  Worked on thought reframing.  Addressed how pt can pace herself.  Worked on self compassion and coping strategies.    Worked on self care strategies. Provided supportive therapy.    Interventions: Cognitive Behavioral Therapy and Interpersonal  Diagnosis:  F33.1  Plan of Care: Recommend ongoing therapy.   Pt participated in setting  therapy goals and agrees with treatment plan.   Pt wants to have someone to talk to and improve coping skills.   Plan to continue to meet every two weeks.    Treatment Plan (Treatment Plan Target Date: 06/21/2023) Client Abilities/Strengths  Pt is bright, engaging, and motivated for therapy.   Client Treatment Preferences  Individual therapy.  Client Statement of Needs  Improve coping skills.  Symptoms  Depressed or irritable mood. Lack of energy. Feelings of hopelessness, worthlessness, or inappropriate guilt. Low self-esteem. Unresolved grief issues.   Problems Addressed  Unipolar Depression Goals 1. Alleviate depressive symptoms and return to previous level of effective functioning. 2. Appropriately grieve the loss in order to normalize mood and to return to previously adaptive level of functioning. Objective Learn and implement behavioral strategies to overcome depression. Target Date: 2023-06-21 Frequency: Biweekly  Progress: 50 Modality: individual  Related Interventions Engage the client in "behavioral activation," increasing his/her activity level and contact with sources of reward, while identifying processes that inhibit activation.  Use behavioral techniques such as instruction, rehearsal, role-playing, role reversal, as needed, to facilitate activity in the client's daily life; reinforce success. Assist the client in developing skills that increase the likelihood of deriving pleasure from behavioral activation (e.g., assertiveness skills, developing an exercise plan, less internal/more external focus, increased social involvement); reinforce success. Objective Identify important people in life, past and present, and describe the quality, good and poor, of those relationships. Target Date: 2023-06-21 Frequency: Biweekly  Progress: 50 Modality: individual  Related Interventions Conduct Interpersonal Therapy beginning with the assessment of the client's "interpersonal  inventory" of important past and present relationships; develop a case formulation linking depression to grief, interpersonal role disputes, role transitions, and/or interpersonal deficits). Objective Learn and implement problem-solving and decision-making skills. Target Date: 2023-06-21 Frequency: Biweekly  Progress: 50 Modality: individual  Related Interventions Conduct Problem-Solving Therapy using techniques such as psychoeducation, modeling, and role-playing to teach client problem-solving skills (i.e., defining a problem specifically, generating possible solutions, evaluating the pros and cons of each solution, selecting and implementing a plan of action, evaluating the efficacy of the plan, accepting or revising the plan); role-play application of the problem-solving skill to a real life issue. Encourage in the client the development of a positive problem orientation in which problems and solving them are viewed as a natural part of life and not something to be feared, despaired, or avoided. 3. Develop healthy interpersonal relationships that lead to the alleviation and help prevent the relapse of depression. 4. Develop healthy thinking patterns and beliefs about self, others, and the world that lead to the alleviation and help prevent the relapse of depression. 5. Recognize, accept, and cope with feelings of depression. Diagnosis F33.1  Conditions For Discharge Achievement of treatment goals and objectives   Salomon Fick, LCSW

## 2023-01-07 ENCOUNTER — Other Ambulatory Visit: Payer: Self-pay | Admitting: Internal Medicine

## 2023-01-17 ENCOUNTER — Ambulatory Visit (INDEPENDENT_AMBULATORY_CARE_PROVIDER_SITE_OTHER): Payer: Medicare Other | Admitting: Psychology

## 2023-01-17 DIAGNOSIS — F331 Major depressive disorder, recurrent, moderate: Secondary | ICD-10-CM

## 2023-01-17 NOTE — Progress Notes (Signed)
[facility] Counselor/Therapist Progress Note  Patient ID: Veronica [name], MRN: [id number],    Date: 01/17/2023  Time Spent: 12:00pm - 12:45pm    45 minutes   Treatment Type: Individual Therapy  Reported Symptoms: stress, loneliness  Mental Status Exam: Appearance:  Casual     Behavior: Appropriate  Motor: Normal  Speech/Language:  Normal Rate  Affect: Appropriate  Mood: normal  Thought process: normal  Thought content:   WNL  Sensory/Perceptual disturbances:   WNL  Orientation: oriented to person, place, time/date, and situation  Attention: Good  Concentration: Good  Memory: WNL  Fund of knowledge:  Good  Insight:   Good  Judgment:  Good  Impulse Control: Good   Risk Assessment: Danger to Self:  No Self-injurious Behavior: No Danger to Others: No Duty to Warn:no Physical Aggression / Violence:No  Access to Firearms a concern: No  Gang Involvement:No   Subjective: Pt present for face-to-face individual therapy via phone.  Pt consents to telehealth phone session and is aware of limitations of phone sessions. (Pt does not have access to video devices.) Location of pt: home Location of therapist: home office.  Pt talked about her health.  Pt has doctors appointment on November 21st to go over her test results.  Pt is worried about her health but is trying to take one day at a time. Pt talked about her 73 yo neighbor going to a nursing home this week.   Pt feels sad for her.  Pt hopes she will never have to go to a nursing home.   Pt is not sure what she will be doing for the upcoming holidays.   She is hoping to be with family and not be alone. Worked on self compassion and coping strategies.    Worked on self care strategies. Provided supportive therapy.    Interventions: Cognitive Behavioral Therapy and Interpersonal  Diagnosis:  F33.1  Plan of Care: Recommend ongoing therapy.   Pt participated in setting therapy goals and agrees with treatment  plan.   Pt wants to have someone to talk to and improve coping skills.   Plan to continue to meet every two weeks.    Treatment Plan (Treatment Plan Target Date: 06/21/2023) Client Abilities/Strengths  Pt is bright, engaging, and motivated for therapy.   Client Treatment Preferences  Individual therapy.  Client Statement of Needs  Improve coping skills.  Symptoms  Depressed or irritable mood. Lack of energy. Feelings of hopelessness, worthlessness, or inappropriate guilt. Low self-esteem. Unresolved grief issues.   Problems Addressed  Unipolar Depression Goals 1. Alleviate depressive symptoms and return to previous level of effective functioning. 2. Appropriately grieve the loss in order to normalize mood and to return to previously adaptive level of functioning. Objective Learn and implement behavioral strategies to overcome depression. Target Date: 2023-06-21 Frequency: Biweekly  Progress: 50 Modality: individual  Related Interventions Engage the client in "behavioral activation," increasing his/her activity level and contact with sources of reward, while identifying processes that inhibit activation.  Use behavioral techniques such as instruction, rehearsal, role-playing, role reversal, as needed, to facilitate activity in the client's daily life; reinforce success. Assist the client in developing skills that increase the likelihood of deriving pleasure from behavioral activation (e.g., assertiveness skills, developing an exercise plan, less internal/more external focus, increased social involvement); reinforce success. Objective Identify important people in life, past and present, and describe the quality, good and poor, of those relationships. Target Date: 2023-06-21 Frequency: Biweekly  Progress: 50 Modality: individual  Related Interventions Conduct Interpersonal Therapy beginning with the assessment of the client's "interpersonal inventory" of important past and present  relationships; develop a case formulation linking depression to grief, interpersonal role disputes, role transitions, and/or interpersonal deficits). Objective Learn and implement problem-solving and decision-making skills. Target Date: 2023-06-21 Frequency: Biweekly  Progress: 50 Modality: individual  Related Interventions Conduct Problem-Solving Therapy using techniques such as psychoeducation, modeling, and role-playing to teach client problem-solving skills (i.e., defining a problem specifically, generating possible solutions, evaluating the pros and cons of each solution, selecting and implementing a plan of action, evaluating the efficacy of the plan, accepting or revising the plan); role-play application of the problem-solving skill to a real life issue. Encourage in the client the development of a positive problem orientation in which problems and solving them are viewed as a natural part of life and not something to be feared, despaired, or avoided. 3. Develop healthy interpersonal relationships that lead to the alleviation and help prevent the relapse of depression. 4. Develop healthy thinking patterns and beliefs about self, others, and the world that lead to the alleviation and help prevent the relapse of depression. 5. Recognize, accept, and cope with feelings of depression. Diagnosis F33.1  Conditions For Discharge Achievement of treatment goals and objectives   Salomon Fick, LCSW

## 2023-01-25 ENCOUNTER — Other Ambulatory Visit (INDEPENDENT_AMBULATORY_CARE_PROVIDER_SITE_OTHER): Payer: Medicare Other

## 2023-01-25 DIAGNOSIS — E162 Hypoglycemia, unspecified: Secondary | ICD-10-CM

## 2023-01-25 DIAGNOSIS — G459 Transient cerebral ischemic attack, unspecified: Secondary | ICD-10-CM | POA: Diagnosis not present

## 2023-01-25 DIAGNOSIS — E890 Postprocedural hypothyroidism: Secondary | ICD-10-CM

## 2023-01-25 DIAGNOSIS — I25111 Atherosclerotic heart disease of native coronary artery with angina pectoris with documented spasm: Secondary | ICD-10-CM

## 2023-01-25 LAB — CBC WITH DIFFERENTIAL/PLATELET
Basophils Absolute: 0 10*3/uL (ref 0.0–0.1)
Basophils Relative: 0.6 % (ref 0.0–3.0)
Eosinophils Absolute: 0.1 10*3/uL (ref 0.0–0.7)
Eosinophils Relative: 1.9 % (ref 0.0–5.0)
HCT: 40 % (ref 36.0–46.0)
Hemoglobin: 13.5 g/dL (ref 12.0–15.0)
Lymphocytes Relative: 25.3 % (ref 12.0–46.0)
Lymphs Abs: 1.7 10*3/uL (ref 0.7–4.0)
MCHC: 33.9 g/dL (ref 30.0–36.0)
MCV: 90.4 fL (ref 78.0–100.0)
Monocytes Absolute: 1 10*3/uL (ref 0.1–1.0)
Monocytes Relative: 14.4 % — ABNORMAL HIGH (ref 3.0–12.0)
Neutro Abs: 4 10*3/uL (ref 1.4–7.7)
Neutrophils Relative %: 57.8 % (ref 43.0–77.0)
Platelets: 277 10*3/uL (ref 150.0–400.0)
RBC: 4.42 Mil/uL (ref 3.87–5.11)
RDW: 13.9 % (ref 11.5–15.5)
WBC: 6.9 10*3/uL (ref 4.0–10.5)

## 2023-01-25 LAB — URINALYSIS, ROUTINE W REFLEX MICROSCOPIC
Bilirubin Urine: NEGATIVE
Ketones, ur: NEGATIVE
Nitrite: NEGATIVE
Specific Gravity, Urine: 1.015 (ref 1.000–1.030)
Total Protein, Urine: NEGATIVE
Urine Glucose: NEGATIVE
Urobilinogen, UA: 0.2 (ref 0.0–1.0)
pH: 6 (ref 5.0–8.0)

## 2023-01-25 LAB — COMPREHENSIVE METABOLIC PANEL WITH GFR
ALT: 13 U/L (ref 0–35)
AST: 21 U/L (ref 0–37)
Albumin: 4 g/dL (ref 3.5–5.2)
Alkaline Phosphatase: 99 U/L (ref 39–117)
BUN: 7 mg/dL (ref 6–23)
CO2: 30 meq/L (ref 19–32)
Calcium: 8.2 mg/dL — ABNORMAL LOW (ref 8.4–10.5)
Chloride: 99 meq/L (ref 96–112)
Creatinine, Ser: 0.67 mg/dL (ref 0.40–1.20)
GFR: 86.45 mL/min (ref >60.00)
Glucose, Bld: 187 mg/dL — ABNORMAL HIGH (ref 70–99)
Potassium: 4.1 meq/L (ref 3.5–5.1)
Sodium: 137 meq/L (ref 135–145)
Total Bilirubin: 0.3 mg/dL (ref 0.2–1.2)
Total Protein: 6.4 g/dL (ref 6.0–8.3)

## 2023-01-25 LAB — LIPID PANEL
Cholesterol: 142 mg/dL (ref 0–200)
HDL: 48.4 mg/dL (ref >39.00)
LDL Cholesterol: 65 mg/dL (ref 0–99)
NonHDL: 93.26
Total CHOL/HDL Ratio: 3
Triglycerides: 142 mg/dL (ref 0.0–149.0)
VLDL: 28.4 mg/dL (ref 0.0–40.0)

## 2023-01-25 LAB — T4, FREE: Free T4: 0.7 ng/dL (ref 0.60–1.60)

## 2023-01-25 LAB — HEMOGLOBIN A1C: Hgb A1c MFr Bld: 5.7 % (ref 4.6–6.5)

## 2023-01-25 LAB — TSH: TSH: 1.47 u[IU]/mL (ref 0.35–5.50)

## 2023-01-31 ENCOUNTER — Ambulatory Visit (INDEPENDENT_AMBULATORY_CARE_PROVIDER_SITE_OTHER): Payer: Medicare Other | Admitting: Psychology

## 2023-01-31 DIAGNOSIS — F331 Major depressive disorder, recurrent, moderate: Secondary | ICD-10-CM | POA: Diagnosis not present

## 2023-01-31 NOTE — Progress Notes (Signed)
[facility] Counselor/Therapist Progress Note  Patient ID: ADRIONA WOOLBERT, MRN: [id number],    Date: 01/31/2023  Time Spent: 12:00pm - 12:45pm    45 minutes   Treatment Type: Individual Therapy  Reported Symptoms: stress, loneliness  Mental Status Exam: Appearance:  Casual     Behavior: Appropriate  Motor: Normal  Speech/Language:  Normal Rate  Affect: Appropriate  Mood: normal  Thought process: normal  Thought content:   WNL  Sensory/Perceptual disturbances:   WNL  Orientation: oriented to person, place, time/date, and situation  Attention: Good  Concentration: Good  Memory: WNL  Fund of knowledge:  Good  Insight:   Good  Judgment:  Good  Impulse Control: Good   Risk Assessment: Danger to Self:  No Self-injurious Behavior: No Danger to Others: No Duty to Warn:no Physical Aggression / Violence:No  Access to Firearms a concern: No  Gang Involvement:No   Subjective: Pt present for face-to-face individual therapy via phone.  Pt consents to telehealth phone session and is aware of limitations of phone sessions. (Pt does not have access to video devices.) Location of pt: home Location of therapist: home office.  Pt talked about her health.  Pt has doctors appointment on November 21st to go over her test results.  Pt did find out her sugar is elevated and so she has to go on ano carb diet.  Addressed how challenging this is for pt.  Pt is worried about her health but is trying to take one day at a time. Pt has had some problems with her house.  She has needed house repairs and her heat went out.   This is financial burden for pt.  Pt feels the stress of these issues.   She was feelings down.   Worked on self compassion and coping strategies.   Worked on how pt can release worries.   She will make a worry box to write down her worries and put them in the box.  Pt is worried about her 2 yo neighbor who went to a nursing home.  The neighbor fell and ended up in  the hospital.   Pt talked about  the holidays.  She does not have any plans for Thanksgiving which is hard for her.  Worked on self care strategies. Provided supportive therapy.    Interventions: Cognitive Behavioral Therapy and Interpersonal  Diagnosis:  F33.1  Plan of Care: Recommend ongoing therapy.   Pt participated in setting therapy goals and agrees with treatment plan.   Pt wants to have someone to talk to and improve coping skills.   Plan to continue to meet every two weeks.    Treatment Plan (Treatment Plan Target Date: 06/21/2023) Client Abilities/Strengths  Pt is bright, engaging, and motivated for therapy.   Client Treatment Preferences  Individual therapy.  Client Statement of Needs  Improve coping skills.  Symptoms  Depressed or irritable mood. Lack of energy. Feelings of hopelessness, worthlessness, or inappropriate guilt. Low self-esteem. Unresolved grief issues.   Problems Addressed  Unipolar Depression Goals 1. Alleviate depressive symptoms and return to previous level of effective functioning. 2. Appropriately grieve the loss in order to normalize mood and to return to previously adaptive level of functioning. Objective Learn and implement behavioral strategies to overcome depression. Target Date: 2023-06-21 Frequency: Biweekly  Progress: 50 Modality: individual  Related Interventions Engage the client in "behavioral activation," increasing his/her activity level and contact with sources of reward, while identifying processes that inhibit activation.  Use behavioral techniques  such as instruction, rehearsal, role-playing, role reversal, as needed, to facilitate activity in the client's daily life; reinforce success. Assist the client in developing skills that increase the likelihood of deriving pleasure from behavioral activation (e.g., assertiveness skills, developing an exercise plan, less internal/more external focus, increased social involvement); reinforce  success. Objective Identify important people in life, past and present, and describe the quality, good and poor, of those relationships. Target Date: 2023-06-21 Frequency: Biweekly  Progress: 50 Modality: individual  Related Interventions Conduct Interpersonal Therapy beginning with the assessment of the client's "interpersonal inventory" of important past and present relationships; develop a case formulation linking depression to grief, interpersonal role disputes, role transitions, and/or interpersonal deficits). Objective Learn and implement problem-solving and decision-making skills. Target Date: 2023-06-21 Frequency: Biweekly  Progress: 50 Modality: individual  Related Interventions Conduct Problem-Solving Therapy using techniques such as psychoeducation, modeling, and role-playing to teach client problem-solving skills (i.e., defining a problem specifically, generating possible solutions, evaluating the pros and cons of each solution, selecting and implementing a plan of action, evaluating the efficacy of the plan, accepting or revising the plan); role-play application of the problem-solving skill to a real life issue. Encourage in the client the development of a positive problem orientation in which problems and solving them are viewed as a natural part of life and not something to be feared, despaired, or avoided. 3. Develop healthy interpersonal relationships that lead to the alleviation and help prevent the relapse of depression. 4. Develop healthy thinking patterns and beliefs about self, others, and the world that lead to the alleviation and help prevent the relapse of depression. 5. Recognize, accept, and cope with feelings of depression. Diagnosis F33.1  Conditions For Discharge Achievement of treatment goals and objectives   Salomon Fick, LCSW

## 2023-02-03 ENCOUNTER — Ambulatory Visit: Payer: Medicare Other | Admitting: Internal Medicine

## 2023-02-03 ENCOUNTER — Encounter: Payer: Self-pay | Admitting: Internal Medicine

## 2023-02-03 VITALS — BP 130/66 | HR 69 | Temp 98.1°F | Ht 62.0 in | Wt 158.2 lb

## 2023-02-03 DIAGNOSIS — E7800 Pure hypercholesterolemia, unspecified: Secondary | ICD-10-CM

## 2023-02-03 DIAGNOSIS — I69351 Hemiplegia and hemiparesis following cerebral infarction affecting right dominant side: Secondary | ICD-10-CM

## 2023-02-03 DIAGNOSIS — R5383 Other fatigue: Secondary | ICD-10-CM

## 2023-02-03 DIAGNOSIS — E559 Vitamin D deficiency, unspecified: Secondary | ICD-10-CM | POA: Diagnosis not present

## 2023-02-03 DIAGNOSIS — E538 Deficiency of other specified B group vitamins: Secondary | ICD-10-CM | POA: Diagnosis not present

## 2023-02-03 DIAGNOSIS — R739 Hyperglycemia, unspecified: Secondary | ICD-10-CM

## 2023-02-03 DIAGNOSIS — Z23 Encounter for immunization: Secondary | ICD-10-CM

## 2023-02-03 DIAGNOSIS — G459 Transient cerebral ischemic attack, unspecified: Secondary | ICD-10-CM

## 2023-02-03 MED ORDER — CLONAZEPAM 1 MG PO TABS: ORAL_TABLET | ORAL | 1 refills | Status: DC

## 2023-02-03 MED ORDER — CARIPRAZINE HCL 1.5 MG PO CAPS: 1.5000 mg | ORAL_CAPSULE | Freq: Every day | ORAL | 3 refills | Status: DC

## 2023-02-03 MED ORDER — FUROSEMIDE 40 MG PO TABS: 40.0000 mg | ORAL_TABLET | Freq: Every day | ORAL | 3 refills | Status: DC

## 2023-02-03 MED ORDER — PROMETHAZINE-DM 6.25-15 MG/5ML PO SYRP: ORAL_SOLUTION | ORAL | 1 refills | Status: DC

## 2023-02-03 MED ORDER — ACETAMINOPHEN-CODEINE 300-30 MG PO TABS: 1.0000 | ORAL_TABLET | Freq: Four times a day (QID) | ORAL | 3 refills | Status: DC | PRN

## 2023-02-03 MED ORDER — EZETIMIBE 10 MG PO TABS: 10.0000 mg | ORAL_TABLET | Freq: Every day | ORAL | 3 refills | Status: DC

## 2023-02-03 NOTE — Assessment & Plan Note (Signed)
Pre- DM Loose wt Cut back on carbs RTC 3 mo w/labs

## 2023-02-03 NOTE — Assessment & Plan Note (Signed)
On B12 

## 2023-02-03 NOTE — Assessment & Plan Note (Addendum)
(-)   work up w/[facility] - CT, CTA. Cardiac ECHO was OK Residual numbness on the R cheek On ASA 81 mg/d

## 2023-02-03 NOTE — Assessment & Plan Note (Signed)
Using a cane 

## 2023-02-03 NOTE — Assessment & Plan Note (Signed)
Chronic  ?On Vit D ?

## 2023-02-03 NOTE — Progress Notes (Signed)
Subjective:  Patient ID: Veronica [name], female    DOB: 09/01/1950  Age: 72 y.o. MRN: [id number]  CC: Follow-up (3 Month Follow Up. Med Refill. Review recent echocardiogram)   HPI Veronica [name] presents for TIA in July 2024. Cardiac ECHO was ok.  W/up at [facility] was (-).  C/o numbness on the R cheek  Outpatient Medications Prior to Visit  Medication Sig Dispense Refill   albuterol (PROVENTIL) (2.5 MG/3ML) 0.083% nebulizer solution USE 1 VIAL IN NEBULIZER 2 TIMES DAILY. Generic: VENTOLIN 180 mL 3   albuterol (VENTOLIN HFA) 108 (90 Base) MCG/ACT inhaler Inhale 2 puffs into the lungs every 4 (four) hours as needed for wheezing or shortness of breath. 18 g 11   aspirin 81 MG chewable tablet Chew by mouth daily.     atorvastatin (LIPITOR) 40 MG tablet TAKE 1 TABLET BY MOUTH EVERY DAY 90 tablet 1   budesonide (PULMICORT) 0.5 MG/2ML nebulizer solution USE 1 VIAL IN NEBULIZER IN THE MORNING AND AT BEDTIME. Generic: Pulmicort 120 mL 11   calcium carbonate (OSCAL) 1500 (600 Ca) MG TABS tablet Take 600 mg of elemental calcium by mouth 2 (two) times daily with a meal.      Cholecalciferol (VITAMIN D3) 50 MCG (2000 UT) capsule Take 1 capsule (2,000 Units total) by mouth daily. 100 capsule 3   cyanocobalamin (VITAMIN B12) 1000 MCG/ML injection INJECT IN THE MUSCLE EVERY 14 DAYS. 10 mL 5   diphenoxylate-atropine (LOMOTIL) 2.5-0.025 MG tablet Take 1 tablet by mouth 4 (four) times daily as needed for diarrhea or loose stools. 60 tablet 2   Docusate Calcium (STOOL SOFTENER PO) Take 100 mg by mouth as needed (for constipation).     escitalopram (LEXAPRO) 20 MG tablet TAKE 1 TABLET(20 MG) BY MOUTH DAILY 90 tablet 3   Insulin Syringe-Needle U-100 (B-D INSULIN SYRINGE 1CC/25GX1") 25G X 1" 1 ML MISC USE AS DIRECTED EVERY 2 WEEKS 50 each 2   isosorbide mononitrate (IMDUR) 30 MG 24 hr tablet Take 1 tablet (30 mg total) by mouth daily. 90 tablet 3   ketoconazole (NIZORAL) 2 % cream Apply 1 Application  topically daily. 60 g 1   levothyroxine (SYNTHROID) 88 MCG tablet TAKE 1 TABLET(88 MCG) BY MOUTH DAILY 90 tablet 3   metoprolol tartrate (LOPRESSOR) 50 MG tablet TAKE 1 TABLET 2 HOURS PRIOR TO TEST 1 tablet 0   Multiple Vitamins-Minerals (PRESERVISION AREDS 2 PO) Take by mouth.     nitroGLYCERIN (NITROSTAT) 0.4 MG SL tablet DISSOLVE 1 TABLET UNDER THE TONGUE AS DIRECTED. MAX 3 DOSES. CALL 911 IF NEEDING ALL 3 DOSES 25 tablet 2   nystatin (MYCOSTATIN) 100000 UNIT/ML suspension Take 5 mLs (500,000 Units total) by mouth 4 (four) times daily. 60 mL 0   Omega-3 Fatty Acids (FISH OIL) 1000 MG CAPS Take 1 capsule by mouth daily.     ondansetron (ZOFRAN) 4 MG tablet Take 1 tablet (4 mg total) by mouth every 8 (eight) hours as needed for nausea or vomiting. 30 tablet 2   phenytoin (DILANTIN) 100 MG ER capsule TAKE 2 CAPSULES BY MOUTH EVERY MORNING AND 1 CAPSULE BY MOUTH EVERY EVENING 270 capsule 3   phenytoin (DILANTIN) 100 MG ER capsule TAKE 2 CAPSULES BY MOUTH EVERY MORNING AND 1 CAPSULES EVERY EVENING 270 capsule 3   potassium chloride SA (KLOR-CON M) 20 MEQ tablet Take 1 tablet (20 mEq total) by mouth daily. 90 tablet 3   Respiratory Therapy Supplies (FLUTTER) DEVI Use after nebulization treatments 1  each 0   sodium chloride (MURO 128) 5 % ophthalmic solution Place 1 drop into both eyes as needed for irritation.     SYRINGE-NEEDLE, DISP, 3 ML (BD ECLIPSE SYRINGE) 25G X 1" 3 ML MISC Use sq q 2 wks 50 each 3   traZODone (DESYREL) 50 MG tablet TAKE 4 TABLETS(200 MG) BY MOUTH AT BEDTIME 360 tablet 3   verapamil (CALAN-SR) 240 MG CR tablet TAKE 1 TABLET(240 MG) BY MOUTH AT BEDTIME 90 tablet 3   acetaminophen-codeine (TYLENOL #3) 300-30 MG tablet Take 1 tablet by mouth every 6 (six) hours as needed. 60 tablet 3   cariprazine (VRAYLAR) 1.5 MG capsule Take 1 capsule (1.5 mg total) by mouth daily. [employer] @ [phone] Lot # [id number] Exp- [id number] 90 capsule 0   clonazePAM (KLONOPIN) 1 MG tablet TAKE 2  TABLETS BY MOUTH EVERY NIGHT AT BEDTIME AND 1/2 TABLET EXTRA IN DAYTIME AS NEEDED 225 tablet 1   ezetimibe (ZETIA) 10 MG tablet TAKE 1 TABLET(10 MG) BY MOUTH DAILY 90 tablet 1   furosemide (LASIX) 40 MG tablet Take 1 tablet (40 mg total) by mouth daily. 90 tablet 3   promethazine-dextromethorphan (PROMETHAZINE-DM) 6.25-15 MG/5ML syrup TAKE 5 ML BY MOUTH FOUR TIMES DAILY AS NEEDED FOR COUGH 240 mL 1   promethazine (PHENERGAN) 12.5 MG tablet Take by mouth. (Patient not taking: Reported on 02/03/2023)     arformoterol (BROVANA) 15 MCG/2ML NEBU Take 2 mLs (15 mcg total) by nebulization 2 (two) times daily. (Patient not taking: Reported on 02/03/2023) 120 mL 11   chlorhexidine (PERIDEX) 0.12 % solution Use to wash mouth twice a day (Patient not taking: Reported on 02/03/2023)     revefenacin (YUPELRI) 175 MCG/3ML nebulizer solution Take 3 mLs (175 mcg total) by nebulization daily. (Patient not taking: Reported on 02/03/2023)     No facility-administered medications prior to visit.    ROS: Review of Systems  Constitutional:  Negative for activity change, appetite change, chills, fatigue and unexpected weight change.  HENT:  Negative for congestion, mouth sores and sinus pressure.   Eyes:  Negative for visual disturbance.  Respiratory:  Negative for cough and chest tightness.   Gastrointestinal:  Negative for abdominal pain and nausea.  Genitourinary:  Negative for difficulty urinating, frequency and vaginal pain.  Musculoskeletal:  Negative for back pain and gait problem.  Skin:  Negative for pallor and rash.  Neurological:  Positive for weakness and numbness. Negative for dizziness, tremors and headaches.  Hematological:  Does not bruise/bleed easily.  Psychiatric/Behavioral:  Negative for confusion, dysphoric mood, sleep disturbance and suicidal ideas. The patient is not nervous/anxious.     Objective:  BP 130/66   Pulse 69   Temp 98.1 F (36.7 C) (Oral)   Ht 5\' 2"  (1.575 m)   Wt 158 lb  3.2 oz (71.8 kg)   SpO2 97%   BMI 28.94 kg/m   BP Readings from Last 3 Encounters:  02/03/23 130/66  11/23/22 128/68  11/03/22 112/70    Wt Readings from Last 3 Encounters:  02/03/23 158 lb 3.2 oz (71.8 kg)  11/23/22 155 lb 12.8 oz (70.7 kg)  11/03/22 153 lb (69.4 kg)    Physical Exam Constitutional:      General: She is not in acute distress.    Appearance: She is well-developed. She is obese.  HENT:     Head: Normocephalic.     Right Ear: External ear normal.     Left Ear: External ear normal.  Nose: Nose normal.  Eyes:     General:        Right eye: No discharge.        Left eye: No discharge.     Conjunctiva/sclera: Conjunctivae normal.     Pupils: Pupils are equal, round, and reactive to light.  Neck:     Thyroid: No thyromegaly.     Vascular: No JVD.     Trachea: No tracheal deviation.  Cardiovascular:     Rate and Rhythm: Normal rate and regular rhythm.     Heart sounds: Normal heart sounds.  Pulmonary:     Effort: No respiratory distress.     Breath sounds: No stridor. No wheezing.  Abdominal:     General: Bowel sounds are normal. There is no distension.     Palpations: Abdomen is soft. There is no mass.     Tenderness: There is no abdominal tenderness. There is no guarding or rebound.  Musculoskeletal:        General: No tenderness.     Cervical back: Normal range of motion and neck supple. No rigidity.  Lymphadenopathy:     Cervical: No cervical adenopathy.  Skin:    Findings: No erythema or rash.  Neurological:     Mental Status: She is oriented to person, place, and time.     Cranial Nerves: No cranial nerve deficit.     Motor: Weakness present. No abnormal muscle tone.     Coordination: Coordination abnormal.     Gait: Gait abnormal.     Deep Tendon Reflexes: Reflexes normal.  Psychiatric:        Behavior: Behavior normal.        Thought Content: Thought content normal.        Judgment: Judgment normal.   Using a cane  Lab Results   Component Value Date   WBC 6.9 01/25/2023   HGB 13.5 01/25/2023   HCT 40.0 01/25/2023   PLT 277.0 01/25/2023   GLUCOSE 187 (H) 01/25/2023   CHOL 142 01/25/2023   TRIG 142.0 01/25/2023   HDL 48.40 01/25/2023   LDLDIRECT 165.0 09/26/2009   LDLCALC 65 01/25/2023   ALT 13 01/25/2023   AST 21 01/25/2023   NA 137 01/25/2023   K 4.1 01/25/2023   CL 99 01/25/2023   CREATININE 0.67 01/25/2023   BUN 7 01/25/2023   CO2 30 01/25/2023   TSH 1.47 01/25/2023   INR 1.0 12/18/2021   HGBA1C 5.7 01/25/2023    MR Abdomen W Wo Contrast  Result Date: 12/29/2021 CLINICAL DATA:  Possible hepatic cirrhosis and indeterminate liver lesion on recent noncontrast chest CT. EXAM: MRI ABDOMEN WITHOUT AND WITH CONTRAST TECHNIQUE: Multiplanar multisequence MR imaging of the abdomen was performed both before and after the administration of intravenous contrast. CONTRAST:  7mL GADAVIST GADOBUTROL 1 MMOL/ML IV SOLN COMPARISON:  Noncontrast chest CT on 09/14/2021 FINDINGS: Lower chest: No acute findings. Hepatobiliary: A few tiny sub-cm hepatic cysts are seen in the right and left hepatic lobes. No hepatic masses identified. No definite gross morphologic findings of cirrhosis identified. Prior cholecystectomy. No evidence of biliary obstruction. Pancreas:  No mass or inflammatory changes. Spleen:  Within normal limits in size and appearance. Adrenals/Urinary Tract: No suspicious masses identified. No evidence of hydronephrosis. Stomach/Bowel: Unremarkable. Vascular/Lymphatic: No pathologically enlarged lymph nodes identified. No acute vascular findings. Other:  None. Musculoskeletal:  No suspicious bone lesions identified. IMPRESSION: Several tiny sub-cm hepatic cysts. No evidence of hepatic neoplasm, or definite imaging signs of cirrhosis. Electronically Signed  By: Danae Orleans M.D.   On: 12/29/2021 15:42    Assessment & Plan:   Problem List Items Addressed This Visit     Vitamin B12 deficiency    On B12       Vitamin D deficiency    Chronic  On Vit D      Fatigue   Relevant Orders   TSH   Hemiparesis affecting right side as late effect of cerebrovascular accident (HCC)    Using a cane      Relevant Orders   TSH   Hyperlipidemia   Relevant Medications   ezetimibe (ZETIA) 10 MG tablet   furosemide (LASIX) 40 MG tablet   TIA (transient ischemic attack)    (-) work up w/[facility] - CT, CTA. Cardiac ECHO was OK Residual numbness on the R cheek On ASA 81 mg/d      Relevant Medications   ezetimibe (ZETIA) 10 MG tablet   furosemide (LASIX) 40 MG tablet   Other Relevant Orders   Comprehensive metabolic panel   CBC with Differential/Platelet   TSH   Hemoglobin A1c   Hyperglycemia    Pre- DM Loose wt Cut back on carbs RTC 3 mo w/labs      Relevant Orders   Comprehensive metabolic panel   CBC with Differential/Platelet   TSH   Hemoglobin A1c   Other Visit Diagnoses     Flu vaccine need    -  Primary   Relevant Orders   Flu Vaccine Trivalent High Dose (Fluad) (Completed)         Meds ordered this encounter  Medications   ezetimibe (ZETIA) 10 MG tablet    Sig: Take 1 tablet (10 mg total) by mouth daily.    Dispense:  90 tablet    Refill:  3   furosemide (LASIX) 40 MG tablet    Sig: Take 1 tablet (40 mg total) by mouth daily.    Dispense:  90 tablet    Refill:  3   promethazine-dextromethorphan (PROMETHAZINE-DM) 6.25-15 MG/5ML syrup    Sig: TAKE 5 ML BY MOUTH FOUR TIMES DAILY AS NEEDED FOR COUGH    Dispense:  240 mL    Refill:  1   acetaminophen-codeine (TYLENOL #3) 300-30 MG tablet    Sig: Take 1 tablet by mouth every 6 (six) hours as needed.    Dispense:  60 tablet    Refill:  3   cariprazine (VRAYLAR) 1.5 MG capsule    Sig: Take 1 capsule (1.5 mg total) by mouth daily. [employer] @ [phone] Lot # [id number] Exp- [id number]    Dispense:  90 capsule    Refill:  3   clonazePAM (KLONOPIN) 1 MG tablet    Sig: TAKE 2 TABLETS BY MOUTH EVERY NIGHT AT  BEDTIME AND 1/2 TABLET EXTRA IN DAYTIME AS NEEDED    Dispense:  225 tablet    Refill:  1      Follow-up: Return in about 3 months (around 05/06/2023) for a follow-up visit.  Sonda Primes, MD

## 2023-02-14 ENCOUNTER — Ambulatory Visit (INDEPENDENT_AMBULATORY_CARE_PROVIDER_SITE_OTHER): Payer: Medicare Other | Admitting: Psychology

## 2023-02-14 DIAGNOSIS — F331 Major depressive disorder, recurrent, moderate: Secondary | ICD-10-CM | POA: Diagnosis not present

## 2023-02-14 NOTE — Progress Notes (Signed)
[facility] Counselor/Therapist Progress Note  Patient ID: Veronica [name], MRN: [id number],    Date: 02/14/2023  Time Spent: 12:00pm - 12:45pm    45 minutes   Treatment Type: Individual Therapy  Reported Symptoms: stress, loneliness  Mental Status Exam: Appearance:  Casual     Behavior: Appropriate  Motor: Normal  Speech/Language:  Normal Rate  Affect: Appropriate  Mood: normal  Thought process: normal  Thought content:   WNL  Sensory/Perceptual disturbances:   WNL  Orientation: oriented to person, place, time/date, and situation  Attention: Good  Concentration: Good  Memory: WNL  Fund of knowledge:  Good  Insight:   Good  Judgment:  Good  Impulse Control: Good   Risk Assessment: Danger to Self:  No Self-injurious Behavior: No Danger to Others: No Duty to Warn:no Physical Aggression / Violence:No  Access to Firearms a concern: No  Gang Involvement:No   Subjective: Pt present for face-to-face individual therapy via phone.  Pt consents to telehealth phone session and is aware of limitations of phone sessions. (Pt does not have access to video devices.) Location of pt: home Location of therapist: home office.  Pt talked about Thanksgiving.   She was alone but her brother's son gave her some plates of food which pt appreciated.  Pt talked about her health.  She met with her PCP who is concerned about pt's blood sugar bc she is pre diabetic.  Pt sees her cardiologist in January.   Pt is concerned about her health.  Addressed pt's worries.   Worked on self compassion and coping strategies.   Worked on how pt can release worries.   She will make a worry box to write down her worries and put them in the box.  Pt is working on taking things one day at a time.  Pt states Christmas will be a quiet one and she will probably be alone.   Worked on self care strategies. Provided supportive therapy.    Interventions: Cognitive Behavioral Therapy and  Interpersonal  Diagnosis:  F33.1  Plan of Care: Recommend ongoing therapy.   Pt participated in setting therapy goals and agrees with treatment plan.   Pt wants to have someone to talk to and improve coping skills.   Plan to continue to meet every two weeks.    Treatment Plan (Treatment Plan Target Date: 06/21/2023) Client Abilities/Strengths  Pt is bright, engaging, and motivated for therapy.   Client Treatment Preferences  Individual therapy.  Client Statement of Needs  Improve coping skills.  Symptoms  Depressed or irritable mood. Lack of energy. Feelings of hopelessness, worthlessness, or inappropriate guilt. Low self-esteem. Unresolved grief issues.   Problems Addressed  Unipolar Depression Goals 1. Alleviate depressive symptoms and return to previous level of effective functioning. 2. Appropriately grieve the loss in order to normalize mood and to return to previously adaptive level of functioning. Objective Learn and implement behavioral strategies to overcome depression. Target Date: 2023-06-21 Frequency: Biweekly  Progress: 50 Modality: individual  Related Interventions Engage the client in "behavioral activation," increasing his/her activity level and contact with sources of reward, while identifying processes that inhibit activation.  Use behavioral techniques such as instruction, rehearsal, role-playing, role reversal, as needed, to facilitate activity in the client's daily life; reinforce success. Assist the client in developing skills that increase the likelihood of deriving pleasure from behavioral activation (e.g., assertiveness skills, developing an exercise plan, less internal/more external focus, increased social involvement); reinforce success. Objective Identify important people in life, past  and present, and describe the quality, good and poor, of those relationships. Target Date: 2023-06-21 Frequency: Biweekly  Progress: 50 Modality: individual  Related  Interventions Conduct Interpersonal Therapy beginning with the assessment of the client's "interpersonal inventory" of important past and present relationships; develop a case formulation linking depression to grief, interpersonal role disputes, role transitions, and/or interpersonal deficits). Objective Learn and implement problem-solving and decision-making skills. Target Date: 2023-06-21 Frequency: Biweekly  Progress: 50 Modality: individual  Related Interventions Conduct Problem-Solving Therapy using techniques such as psychoeducation, modeling, and role-playing to teach client problem-solving skills (i.e., defining a problem specifically, generating possible solutions, evaluating the pros and cons of each solution, selecting and implementing a plan of action, evaluating the efficacy of the plan, accepting or revising the plan); role-play application of the problem-solving skill to a real life issue. Encourage in the client the development of a positive problem orientation in which problems and solving them are viewed as a natural part of life and not something to be feared, despaired, or avoided. 3. Develop healthy interpersonal relationships that lead to the alleviation and help prevent the relapse of depression. 4. Develop healthy thinking patterns and beliefs about self, others, and the world that lead to the alleviation and help prevent the relapse of depression. 5. Recognize, accept, and cope with feelings of depression. Diagnosis F33.1  Conditions For Discharge Achievement of treatment goals and objectives   Salomon Fick, LCSW

## 2023-02-27 ENCOUNTER — Other Ambulatory Visit: Payer: Self-pay | Admitting: Internal Medicine

## 2023-02-28 ENCOUNTER — Ambulatory Visit: Payer: Medicare Other | Admitting: Psychology

## 2023-02-28 DIAGNOSIS — F331 Major depressive disorder, recurrent, moderate: Secondary | ICD-10-CM

## 2023-02-28 NOTE — Progress Notes (Signed)
[facility] Counselor/Therapist Progress Note  Patient ID: MYKELL LUNEAU, MRN: [id number],    Date: 02/28/2023  Time Spent: 12:00pm - 12:45pm    45 minutes   Treatment Type: Individual Therapy  Reported Symptoms: stress, loneliness  Mental Status Exam: Appearance:  Casual     Behavior: Appropriate  Motor: Normal  Speech/Language:  Normal Rate  Affect: Appropriate  Mood: normal  Thought process: normal  Thought content:   WNL  Sensory/Perceptual disturbances:   WNL  Orientation: oriented to person, place, time/date, and situation  Attention: Good  Concentration: Good  Memory: WNL  Fund of knowledge:  Good  Insight:   Good  Judgment:  Good  Impulse Control: Good   Risk Assessment: Danger to Self:  No Self-injurious Behavior: No Danger to Others: No Duty to Warn:no Physical Aggression / Violence:No  Access to Firearms a concern: No  Gang Involvement:No   Subjective: Pt present for face-to-face individual therapy via phone.  Pt consents to telehealth phone session and is aware of limitations of phone sessions. (Pt does not have access to video devices.) Location of pt: home Location of therapist: home office.  Pt talked about her health.   She has been coughing a lot.  She has several doctors appointments in the next few weeks.   Pt has been working around her house.   She has also been doing Christmas shopping for some friends.    Pt states Christmas will be a quiet one and she will probably be alone.  Pt is feeling down about being alone.  Worked on self compassion and coping strategies.   Worked on how pt can release worries.   She will make a worry box to write down her worries and put them in the box.  Pt is working on taking things one day at a time.  Worked on self care strategies. Provided supportive therapy.    Interventions: Cognitive Behavioral Therapy and Interpersonal  Diagnosis:  F33.1  Plan of Care: Recommend ongoing therapy.   Pt  participated in setting therapy goals and agrees with treatment plan.   Pt wants to have someone to talk to and improve coping skills.   Plan to continue to meet every two weeks.    Treatment Plan (Treatment Plan Target Date: 06/21/2023) Client Abilities/Strengths  Pt is bright, engaging, and motivated for therapy.   Client Treatment Preferences  Individual therapy.  Client Statement of Needs  Improve coping skills.  Symptoms  Depressed or irritable mood. Lack of energy. Feelings of hopelessness, worthlessness, or inappropriate guilt. Low self-esteem. Unresolved grief issues.   Problems Addressed  Unipolar Depression Goals 1. Alleviate depressive symptoms and return to previous level of effective functioning. 2. Appropriately grieve the loss in order to normalize mood and to return to previously adaptive level of functioning. Objective Learn and implement behavioral strategies to overcome depression. Target Date: 2023-06-21 Frequency: Biweekly  Progress: 50 Modality: individual  Related Interventions Engage the client in "behavioral activation," increasing his/her activity level and contact with sources of reward, while identifying processes that inhibit activation.  Use behavioral techniques such as instruction, rehearsal, role-playing, role reversal, as needed, to facilitate activity in the client's daily life; reinforce success. Assist the client in developing skills that increase the likelihood of deriving pleasure from behavioral activation (e.g., assertiveness skills, developing an exercise plan, less internal/more external focus, increased social involvement); reinforce success. Objective Identify important people in life, past and present, and describe the quality, good and poor, of those relationships.  Target Date: 2023-06-21 Frequency: Biweekly  Progress: 50 Modality: individual  Related Interventions Conduct Interpersonal Therapy beginning with the assessment of the  client's "interpersonal inventory" of important past and present relationships; develop a case formulation linking depression to grief, interpersonal role disputes, role transitions, and/or interpersonal deficits). Objective Learn and implement problem-solving and decision-making skills. Target Date: 2023-06-21 Frequency: Biweekly  Progress: 50 Modality: individual  Related Interventions Conduct Problem-Solving Therapy using techniques such as psychoeducation, modeling, and role-playing to teach client problem-solving skills (i.e., defining a problem specifically, generating possible solutions, evaluating the pros and cons of each solution, selecting and implementing a plan of action, evaluating the efficacy of the plan, accepting or revising the plan); role-play application of the problem-solving skill to a real life issue. Encourage in the client the development of a positive problem orientation in which problems and solving them are viewed as a natural part of life and not something to be feared, despaired, or avoided. 3. Develop healthy interpersonal relationships that lead to the alleviation and help prevent the relapse of depression. 4. Develop healthy thinking patterns and beliefs about self, others, and the world that lead to the alleviation and help prevent the relapse of depression. 5. Recognize, accept, and cope with feelings of depression. Diagnosis F33.1  Conditions For Discharge Achievement of treatment goals and objectives   Salomon Fick, LCSW

## 2023-03-02 ENCOUNTER — Ambulatory Visit: Payer: Medicare Other | Admitting: Pulmonary Disease

## 2023-03-02 ENCOUNTER — Encounter: Payer: Self-pay | Admitting: Pulmonary Disease

## 2023-03-02 VITALS — BP 126/70 | HR 79 | Temp 98.2°F | Ht 62.0 in | Wt 163.8 lb

## 2023-03-02 DIAGNOSIS — J4489 Other specified chronic obstructive pulmonary disease: Secondary | ICD-10-CM | POA: Diagnosis not present

## 2023-03-02 DIAGNOSIS — J479 Bronchiectasis, uncomplicated: Secondary | ICD-10-CM

## 2023-03-02 MED ORDER — PROMETHAZINE-DM 6.25-15 MG/5ML PO SYRP: ORAL_SOLUTION | ORAL | 3 refills | Status: DC

## 2023-03-02 NOTE — Patient Instructions (Signed)
No changes to medications  Cough syrup refilled.  Return to clinic in 3 months or sooner as needed
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
## 2023-03-07 ENCOUNTER — Encounter: Payer: Self-pay | Admitting: Cardiovascular Disease

## 2023-03-07 ENCOUNTER — Ambulatory Visit: Payer: Medicare Other | Attending: Cardiovascular Disease | Admitting: Cardiovascular Disease

## 2023-03-07 VITALS — BP 130/62 | HR 74 | Ht 62.0 in | Wt 162.4 lb

## 2023-03-07 DIAGNOSIS — E7800 Pure hypercholesterolemia, unspecified: Secondary | ICD-10-CM | POA: Insufficient documentation

## 2023-03-07 DIAGNOSIS — I25111 Atherosclerotic heart disease of native coronary artery with angina pectoris with documented spasm: Secondary | ICD-10-CM | POA: Insufficient documentation

## 2023-03-07 DIAGNOSIS — I700 Atherosclerosis of aorta: Secondary | ICD-10-CM | POA: Insufficient documentation

## 2023-03-07 DIAGNOSIS — I10 Essential (primary) hypertension: Secondary | ICD-10-CM | POA: Diagnosis present

## 2023-03-07 MED ORDER — ISOSORBIDE MONONITRATE ER 60 MG PO TB24: 60.0000 mg | ORAL_TABLET | Freq: Every day | ORAL | 3 refills | Status: DC

## 2023-03-07 NOTE — Patient Instructions (Signed)
Medication Instructions:  Increase Isosorbide Mononitrate (Imdur) to 60 mg daily *If you need a refill on your cardiac medications before your next appointment, please call your pharmacy*  Follow-Up: At [facility], you and your health needs are our priority.  As part of our continuing mission to provide you with exceptional heart care, we have created designated Provider Care Teams.  These Care Teams include your primary Cardiologist (physician) and Advanced Practice Providers (APPs -  Physician Assistants and Nurse Practitioners) who all work together to provide you with the care you need, when you need it.  We recommend signing up for the patient portal called "MyChart".  Sign up information is provided on this After Visit Summary.  MyChart is used to connect with patients for Virtual Visits (Telemedicine).  Patients are able to view lab/test results, encounter notes, upcoming appointments, etc.  Non-urgent messages can be sent to your provider as well.   To learn more about what you can do with MyChart, go to [email or web address].    Your next appointment:   1 year(s)  Provider:   Thurmon Fair, MD

## 2023-03-14 ENCOUNTER — Other Ambulatory Visit: Payer: Self-pay | Admitting: Internal Medicine

## 2023-03-14 ENCOUNTER — Ambulatory Visit: Payer: Medicare Other | Admitting: Psychology

## 2023-03-14 DIAGNOSIS — F331 Major depressive disorder, recurrent, moderate: Secondary | ICD-10-CM

## 2023-03-14 DIAGNOSIS — L509 Urticaria, unspecified: Secondary | ICD-10-CM

## 2023-03-14 DIAGNOSIS — E875 Hyperkalemia: Secondary | ICD-10-CM

## 2023-03-14 DIAGNOSIS — E559 Vitamin D deficiency, unspecified: Secondary | ICD-10-CM

## 2023-03-14 DIAGNOSIS — E538 Deficiency of other specified B group vitamins: Secondary | ICD-10-CM

## 2023-03-14 NOTE — Progress Notes (Signed)
[facility] Counselor/Therapist Progress Note  Patient ID: MONI WHILEY, MRN: [id number],    Date: 03/14/2023  Time Spent: 12:00pm - 12:45pm    45 minutes   Treatment Type: Individual Therapy  Reported Symptoms: stress, loneliness  Mental Status Exam: Appearance:  Casual     Behavior: Appropriate  Motor: Normal  Speech/Language:  Normal Rate  Affect: Appropriate  Mood: normal  Thought process: normal  Thought content:   WNL  Sensory/Perceptual disturbances:   WNL  Orientation: oriented to person, place, time/date, and situation  Attention: Good  Concentration: Good  Memory: WNL  Fund of knowledge:  Good  Insight:   Good  Judgment:  Good  Impulse Control: Good   Risk Assessment: Danger to Self:  No Self-injurious Behavior: No Danger to Others: No Duty to Warn:no Physical Aggression / Violence:No  Access to Firearms a concern: No  Gang Involvement:No   Subjective: Pt present for face-to-face individual therapy via phone.  Pt consents to telehealth phone session and is aware of limitations of phone sessions. (Pt does not have access to video devices.) Location of pt: home Location of therapist: home office.  Pt talked about Christmas.   She was visited by her brother briefly on Christmas day.   The day after Christmas pt went out to eat with extended family.  Pt had an argument with her brother.   Addressed their interactions and how they impacted pt.  Helped pt process her feelings and relationship dynamics.  Pt talked about her health.   She has had tests and doctors appointments and was told her heart is ok.   Worked on self compassion and coping strategies.   Worked on how pt can release worries.    Pt is working on taking things one day at a time.  Worked on self care strategies. Provided supportive therapy.    Interventions: Cognitive Behavioral Therapy and Interpersonal  Diagnosis:  F33.1  Plan of Care: Recommend ongoing therapy.   Pt  participated in setting therapy goals and agrees with treatment plan.   Pt wants to have someone to talk to and improve coping skills.   Plan to continue to meet every two weeks.    Treatment Plan (Treatment Plan Target Date: 06/21/2023) Client Abilities/Strengths  Pt is bright, engaging, and motivated for therapy.   Client Treatment Preferences  Individual therapy.  Client Statement of Needs  Improve coping skills.  Symptoms  Depressed or irritable mood. Lack of energy. Feelings of hopelessness, worthlessness, or inappropriate guilt. Low self-esteem. Unresolved grief issues.   Problems Addressed  Unipolar Depression Goals 1. Alleviate depressive symptoms and return to previous level of effective functioning. 2. Appropriately grieve the loss in order to normalize mood and to return to previously adaptive level of functioning. Objective Learn and implement behavioral strategies to overcome depression. Target Date: 2023-06-21 Frequency: Biweekly  Progress: 50 Modality: individual  Related Interventions Engage the client in "behavioral activation," increasing his/her activity level and contact with sources of reward, while identifying processes that inhibit activation.  Use behavioral techniques such as instruction, rehearsal, role-playing, role reversal, as needed, to facilitate activity in the client's daily life; reinforce success. Assist the client in developing skills that increase the likelihood of deriving pleasure from behavioral activation (e.g., assertiveness skills, developing an exercise plan, less internal/more external focus, increased social involvement); reinforce success. Objective Identify important people in life, past and present, and describe the quality, good and poor, of those relationships. Target Date: 2023-06-21 Frequency: Biweekly  Progress:  50 Modality: individual  Related Interventions Conduct Interpersonal Therapy beginning with the assessment of the  client's "interpersonal inventory" of important past and present relationships; develop a case formulation linking depression to grief, interpersonal role disputes, role transitions, and/or interpersonal deficits). Objective Learn and implement problem-solving and decision-making skills. Target Date: 2023-06-21 Frequency: Biweekly  Progress: 50 Modality: individual  Related Interventions Conduct Problem-Solving Therapy using techniques such as psychoeducation, modeling, and role-playing to teach client problem-solving skills (i.e., defining a problem specifically, generating possible solutions, evaluating the pros and cons of each solution, selecting and implementing a plan of action, evaluating the efficacy of the plan, accepting or revising the plan); role-play application of the problem-solving skill to a real life issue. Encourage in the client the development of a positive problem orientation in which problems and solving them are viewed as a natural part of life and not something to be feared, despaired, or avoided. 3. Develop healthy interpersonal relationships that lead to the alleviation and help prevent the relapse of depression. 4. Develop healthy thinking patterns and beliefs about self, others, and the world that lead to the alleviation and help prevent the relapse of depression. 5. Recognize, accept, and cope with feelings of depression. Diagnosis F33.1  Conditions For Discharge Achievement of treatment goals and objectives   Salomon Fick, LCSW

## 2023-03-17 ENCOUNTER — Telehealth: Payer: Self-pay

## 2023-03-17 ENCOUNTER — Other Ambulatory Visit: Payer: Self-pay | Admitting: Internal Medicine

## 2023-03-17 ENCOUNTER — Other Ambulatory Visit: Payer: Self-pay

## 2023-03-17 MED ORDER — LEVOTHYROXINE SODIUM 88 MCG PO TABS: ORAL_TABLET | ORAL | 3 refills | Status: DC

## 2023-03-17 NOTE — Telephone Encounter (Signed)
 Copied from CRM [id number]. Topic: Clinical - Medication Refill >> Mar 17, 2023  8:51 AM Robinson DEL wrote: Most Recent Primary Care Visit:  Provider: GARALD KARLYNN GAILS  Department: [facility]  Visit Type: OFFICE VISIT  Date: 02/03/2023  Medication: levothyroxine  (SYNTHROID ) 88 MCG tablet  Has the patient contacted their pharmacy? No (Agent: If no, request that the patient contact the pharmacy for the refill. If patient does not wish to contact the pharmacy document the reason why and proceed with request.) (Agent: If yes, when and what did the pharmacy advise?)  Is this the correct pharmacy for this prescription? Yes If no, delete pharmacy and type the correct one.  This is the patient's preferred pharmacy:  [id number] - [place], [place] - [address] AT [facility] OF [address] [place] [address] Phone: [phone] Fax: [phone]    Has the prescription been filled recently? No  Is the patient out of the medication? No  Has the patient been seen for an appointment in the last year OR does the patient have an upcoming appointment? Yes  Can we respond through MyChart? No  Agent: Please be advised that Rx refills may take up to 3 business days. We ask that you follow-up with your pharmacy.

## 2023-03-17 NOTE — Telephone Encounter (Signed)
 Copied from CRM [id number]. Topic: Clinical - Medication Refill >> Mar 17, 2023  9:41 AM Macario HERO wrote: Most Recent Primary Care Visit:  Provider: GARALD KARLYNN GAILS  Department: [facility]  Visit Type: OFFICE VISIT  Date: 02/03/2023  Medication: traZODone  (DESYREL ) 50 MG tablet [[id number]]  Has the patient contacted their pharmacy? No (Agent: If no, request that the patient contact the pharmacy for the refill. If patient does not wish to contact the pharmacy document the reason why and proceed with request.) (Agent: If yes, when and what did the pharmacy advise?)  Is this the correct pharmacy for this prescription? Yes If no, delete pharmacy and type the correct one.  This is the patient's preferred pharmacy:  [id number] - [place], [place] - [address] AT [facility] OF [address] [place] [address] Phone: [phone] Fax: [phone]     Has the prescription been filled recently? Yes  Is the patient out of the medication? No  Has the patient been seen for an appointment in the last year OR does the patient have an upcoming appointment? Yes  Can we respond through MyChart? No  Agent: Please be advised that Rx refills may take up to 3 business days. We ask that you follow-up with your pharmacy.

## 2023-03-20 MED ORDER — TRAZODONE HCL 50 MG PO TABS: ORAL_TABLET | ORAL | 3 refills | Status: AC

## 2023-03-20 NOTE — Telephone Encounter (Signed)
 Okay. Thank you.

## 2023-03-20 NOTE — Addendum Note (Signed)
 Addended by: Tresa Garter on: 03/20/2023 10:55 PM   Modules accepted: Orders

## 2023-03-21 MED ORDER — TRAZODONE HCL 50 MG PO TABS: ORAL_TABLET | ORAL | 3 refills | Status: AC

## 2023-03-23 ENCOUNTER — Telehealth: Payer: Self-pay | Admitting: Internal Medicine

## 2023-03-23 NOTE — Telephone Encounter (Signed)
 Copied from CRM [id number]. Topic: Clinical - Medication Question >> Mar 23, 2023  1:44 PM Pinkey ORN wrote: Reason for CRM: Vraylar  >> Mar 23, 2023  1:45 PM Pinkey ORN wrote: Patient states she's suppose to have a prescription for Vraylar  but hasn't heard anything back in regards to it.

## 2023-03-24 ENCOUNTER — Other Ambulatory Visit: Payer: Self-pay | Admitting: Cardiology

## 2023-03-24 NOTE — Telephone Encounter (Signed)
 I think I signed the paperwork a couple weeks ago.  Please check with Veronica [name] when she is back.  Thank you

## 2023-03-25 ENCOUNTER — Telehealth: Payer: Self-pay

## 2023-03-25 DIAGNOSIS — F332 Major depressive disorder, recurrent severe without psychotic features: Secondary | ICD-10-CM

## 2023-03-25 DIAGNOSIS — F329 Major depressive disorder, single episode, unspecified: Secondary | ICD-10-CM

## 2023-03-25 MED ORDER — CARIPRAZINE HCL 1.5 MG PO CAPS: 1.5000 mg | ORAL_CAPSULE | Freq: Every day | ORAL | 3 refills | Status: DC

## 2023-03-25 NOTE — Telephone Encounter (Addendum)
 Called [facility] at [phone] to get pts rx for cariprazine  (VRAYLAR ) 1.5 MG capsule refilled.  I was able to send in an update rx to be refilled via fax.  I speak with Genta and she has stated the pt is needing to send in proof of income, how many ppl live in her home and an updated insurance card for 2025.  I was able to speak with the pt and inform her of the information needed. Pt stated understanding. Pt states she will come in today with the information so it can be faxed over to [facility].  Fax to [facility] is [phone].

## 2023-03-28 ENCOUNTER — Ambulatory Visit: Payer: Medicare Other | Admitting: Psychology

## 2023-03-28 DIAGNOSIS — F331 Major depressive disorder, recurrent, moderate: Secondary | ICD-10-CM | POA: Diagnosis not present

## 2023-03-28 NOTE — Progress Notes (Signed)
 [facility] Counselor/Therapist Progress Note  Patient ID: Veronica [name], MRN: [id number],    Date: 03/28/2023  Time Spent: 12:00pm - 12:45pm    45 minutes   Treatment Type: Individual Therapy  Reported Symptoms: stress, loneliness  Mental Status Exam: Appearance:  Casual     Behavior: Appropriate  Motor: Normal  Speech/Language:  Normal Rate  Affect: Appropriate  Mood: normal  Thought process: normal  Thought content:   WNL  Sensory/Perceptual disturbances:   WNL  Orientation: oriented to person, place, time/date, and situation  Attention: Good  Concentration: Good  Memory: WNL  Fund of knowledge:  Good  Insight:   Good  Judgment:  Good  Impulse Control: Good   Risk Assessment: Danger to Self:  No Self-injurious Behavior: No Danger to Others: No Duty to Warn:no Physical Aggression / Violence:No  Access to Firearms a concern: No  Gang Involvement:No   Subjective: Pt present for face-to-face individual therapy via phone.  Pt consents to telehealth phone session and is aware of limitations of phone sessions. (Pt does not have access to video devices.) Location of pt: home Location of therapist: home office.  Pt talked about her health.  She has a headache.   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  She hopes the shot will help her headache.  Pt states she has felt a lot of stress.  Her niece's father has cancer.  Pt's brother Bruce's wife had back surgery and they had to remove two ruptured disks.  She is on bed rest and has to wear a back brace for 9 months.   Bruce is having to do caregiving and take care of everything at home.  Pt is worried about her brother.   Pt states she feels down especially with not feeling well.  She feels the post holiday blues.  January and February are difficult months for her.  Helped pt process her feelings.  Worked on self compassion and coping strategies.   Worked on how pt can release  worries.    Pt is working on taking things one day at a time.  Worked on self care strategies. Provided supportive therapy.    Interventions: Cognitive Behavioral Therapy and Interpersonal  Diagnosis:  F33.1  Plan of Care: Recommend ongoing therapy.   Pt participated in setting therapy goals and agrees with treatment plan.   Pt wants to have someone to talk to and improve coping skills.   Plan to continue to meet every two weeks.    Treatment Plan (Treatment Plan Target Date: 06/21/2023) Client Abilities/Strengths  Pt is bright, engaging, and motivated for therapy.   Client Treatment Preferences  Individual therapy.  Client Statement of Needs  Improve coping skills.  Symptoms  Depressed or irritable mood. Lack of energy. Feelings of hopelessness, worthlessness, or inappropriate guilt. Low self-esteem. Unresolved grief issues.   Problems Addressed  Unipolar Depression Goals 1. Alleviate depressive symptoms and return to previous level of effective functioning. 2. Appropriately grieve the loss in order to normalize mood and to return to previously adaptive level of functioning. Objective Learn and implement behavioral strategies to overcome depression. Target Date: 2023-06-21 Frequency: Biweekly  Progress: 50 Modality: individual  Related Interventions Engage the client in behavioral activation, increasing his/her activity level and contact with sources of reward, while identifying processes that inhibit activation.  Use behavioral techniques such as instruction, rehearsal, role-playing, role reversal, as needed, to facilitate activity in the client's daily life; reinforce success.  Assist the client in developing skills that increase the likelihood of deriving pleasure from behavioral activation (e.g., assertiveness skills, developing an exercise plan, less internal/more external focus, increased social involvement); reinforce success. Objective Identify important people in  life, past and present, and describe the quality, good and poor, of those relationships. Target Date: 2023-06-21 Frequency: Biweekly  Progress: 50 Modality: individual  Related Interventions Conduct Interpersonal Therapy beginning with the assessment of the client's interpersonal inventory of important past and present relationships; develop a case formulation linking depression to grief, interpersonal role disputes, role transitions, and/or interpersonal deficits). Objective Learn and implement problem-solving and decision-making skills. Target Date: 2023-06-21 Frequency: Biweekly  Progress: 50 Modality: individual  Related Interventions Conduct Problem-Solving Therapy using techniques such as psychoeducation, modeling, and role-playing to teach client problem-solving skills (i.e., defining a problem specifically, generating possible solutions, evaluating the pros and cons of each solution, selecting and implementing a plan of action, evaluating the efficacy of the plan, accepting or revising the plan); role-play application of the problem-solving skill to a real life issue. Encourage in the client the development of a positive problem orientation in which problems and solving them are viewed as a natural part of life and not something to be feared, despaired, or avoided. 3. Develop healthy interpersonal relationships that lead to the alleviation and help prevent the relapse of depression. 4. Develop healthy thinking patterns and beliefs about self, others, and the world that lead to the alleviation and help prevent the relapse of depression. 5. Recognize, accept, and cope with feelings of depression. Diagnosis F33.1  Conditions For Discharge Achievement of treatment goals and objectives   [name], LCSW

## 2023-03-30 ENCOUNTER — Other Ambulatory Visit: Payer: Self-pay | Admitting: Internal Medicine

## 2023-04-11 ENCOUNTER — Telehealth: Payer: Self-pay | Admitting: Internal Medicine

## 2023-04-11 ENCOUNTER — Ambulatory Visit: Payer: Medicare Other | Admitting: Psychology

## 2023-04-11 DIAGNOSIS — F331 Major depressive disorder, recurrent, moderate: Secondary | ICD-10-CM
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
## 2023-04-11 NOTE — Telephone Encounter (Signed)
Veronica [name] with [facility] calls today in regards to patient's vraylar. I informed them that there is no current note on the status of the vraylar and that we hadn't since received any of the medication. Informed patient we would update her as we knew more.

## 2023-04-11 NOTE — Telephone Encounter (Signed)
Copied from CRM [id number]. Topic: Clinical - Medication Question >> Apr 11, 2023 10:33 AM Kathryne Eriksson wrote: Reason for CRM: cariprazine (VRAYLAR) 1.5 MG capsule >> Apr 11, 2023 10:36 AM Kathryne Eriksson wrote: Patient states she hasn't received nor has she been given an update as far as her medication cariprazine (VRAYLAR) 1.5 MG capsule . Patient states she's almost completely out and wanting to know what's going on with it. Patient is requesting a call back with an update, call back number [phone]

## 2023-04-11 NOTE — Telephone Encounter (Signed)
Copied from CRM [id number]. Topic: Clinical - Medication Question >> Apr 11, 2023  4:29 PM Leavy Cella D wrote: Reason for CRM: Patient stated she has not received a update with patient assistance medication Vraylar . Patient stated that a delay has happen before a call out to the insurance company may help the delay .Patient would like to be updated with and outcome.Patient would like to speak to [facility] for updates .

## 2023-04-12 NOTE — Telephone Encounter (Signed)
Spoke with the pt and was able to inform her that we have gotten a notice stating Veronica [name] was awaiting on more information from the pt. I was also able to give the pt the number ([facility] at [phone]) to call to speak with them and see what all they were needing.  I did inform pt to please call the office back and ask to speak with me to inform me of what was said about pts meds.

## 2023-04-12 NOTE — Telephone Encounter (Signed)
Copied from CRM [id number]. Topic: Clinical - Prescription Issue >> Apr 12, 2023  9:54 AM Theodis Sato wrote: Reason for CRM: **Message for Byrd Hesselbach** Patient is returning your call regarding her medication assistance for Vraylar.

## 2023-04-14 ENCOUNTER — Other Ambulatory Visit (INDEPENDENT_AMBULATORY_CARE_PROVIDER_SITE_OTHER): Payer: Medicare Other

## 2023-04-14 DIAGNOSIS — R5383 Other fatigue: Secondary | ICD-10-CM | POA: Diagnosis not present

## 2023-04-14 DIAGNOSIS — G459 Transient cerebral ischemic attack, unspecified: Secondary | ICD-10-CM | POA: Diagnosis not present

## 2023-04-14 DIAGNOSIS — I69351 Hemiplegia and hemiparesis following cerebral infarction affecting right dominant side: Secondary | ICD-10-CM

## 2023-04-14 DIAGNOSIS — R739 Hyperglycemia, unspecified: Secondary | ICD-10-CM | POA: Diagnosis not present

## 2023-04-14 LAB — COMPREHENSIVE METABOLIC PANEL
ALT: 12 U/L (ref 0–35)
AST: 19 U/L (ref 0–37)
Albumin: 4.1 g/dL (ref 3.5–5.2)
Alkaline Phosphatase: 105 U/L (ref 39–117)
BUN: 8 mg/dL (ref 6–23)
CO2: 27 meq/L (ref 19–32)
Calcium: 8.2 mg/dL — ABNORMAL LOW (ref 8.4–10.5)
Chloride: 102 meq/L (ref 96–112)
Creatinine, Ser: 0.66 mg/dL (ref 0.40–1.20)
GFR: 86.64 mL/min (ref >60.00)
Glucose, Bld: 190 mg/dL — ABNORMAL HIGH (ref 70–99)
Potassium: 3.9 meq/L (ref 3.5–5.1)
Sodium: 139 meq/L (ref 135–145)
Total Bilirubin: 0.3 mg/dL (ref 0.2–1.2)
Total Protein: 6.7 g/dL (ref 6.0–8.3)

## 2023-04-14 LAB — CBC WITH DIFFERENTIAL/PLATELET
Basophils Absolute: 0 10*3/uL (ref 0.0–0.1)
Basophils Relative: 0.6 % (ref 0.0–3.0)
Eosinophils Absolute: 0.2 10*3/uL (ref 0.0–0.7)
Eosinophils Relative: 2.4 % (ref 0.0–5.0)
HCT: 39.9 % (ref 36.0–46.0)
Hemoglobin: 13.1 g/dL (ref 12.0–15.0)
Lymphocytes Relative: 24.4 % (ref 12.0–46.0)
Lymphs Abs: 1.7 10*3/uL (ref 0.7–4.0)
MCHC: 32.8 g/dL (ref 30.0–36.0)
MCV: 90 fL (ref 78.0–100.0)
Monocytes Absolute: 1 10*3/uL (ref 0.1–1.0)
Monocytes Relative: 14.4 % — ABNORMAL HIGH (ref 3.0–12.0)
Neutro Abs: 4.1 10*3/uL (ref 1.4–7.7)
Neutrophils Relative %: 58.2 % (ref 43.0–77.0)
Platelets: 329 10*3/uL (ref 150.0–400.0)
RBC: 4.44 Mil/uL (ref 3.87–5.11)
RDW: 14.3 % (ref 11.5–15.5)
WBC: 7.1 10*3/uL (ref 4.0–10.5)

## 2023-04-14 LAB — HEMOGLOBIN A1C: Hgb A1c MFr Bld: 5.8 % (ref 4.6–6.5)

## 2023-04-14 LAB — TSH: TSH: 1.66 u[IU]/mL (ref 0.35–5.50)

## 2023-04-14 NOTE — Telephone Encounter (Signed)
Patient would like a call back from [place] regarding further information. Best callback is [phone].

## 2023-04-15 NOTE — Telephone Encounter (Signed)
Spoke with Safi the Pharmacist with [facility] for pts Vraylar.. He has stated he did see that all documents have been received and Pts application is in pending status. He did mark this as URGENT review due to pt needing medication ASAP!  Spoke with pt and was able to inform her of the above information. Pt has stated understanding.

## 2023-04-19 ENCOUNTER — Ambulatory Visit (INDEPENDENT_AMBULATORY_CARE_PROVIDER_SITE_OTHER): Payer: Medicare Other | Admitting: *Deleted

## 2023-04-19 VITALS — Ht 62.0 in

## 2023-04-19 DIAGNOSIS — Z1231 Encounter for screening mammogram for malignant neoplasm of breast: Secondary | ICD-10-CM

## 2023-04-19 DIAGNOSIS — M810 Age-related osteoporosis without current pathological fracture: Secondary | ICD-10-CM

## 2023-04-19 DIAGNOSIS — Z0000 Encounter for general adult medical examination without abnormal findings: Secondary | ICD-10-CM | POA: Diagnosis not present

## 2023-04-19 NOTE — Patient Instructions (Signed)
 Ms. Veronica [name] , Thank you for taking time to come for your Medicare Wellness Visit. I appreciate your ongoing commitment to your health goals. Please review the following plan we discussed and let me know if I can assist you in the future.   Screening recommendations/referrals: Colonoscopy:  Mammogram: ordered Bone Density: ordered Recommended yearly ophthalmology/optometry visit for glaucoma screening and checkup Recommended yearly dental visit for hygiene and checkup  Vaccinations: Influenza vaccine: up to date Pneumococcal vaccine: up to dat e      Preventive Care 65 Years and Older, Female Preventive care refers to lifestyle choices and visits with your health care provider that can promote health and wellness. What does preventive care include? A yearly physical exam. This is also called an annual well check. Dental exams once or twice a year. Routine eye exams. Ask your health care provider how often you should have your eyes checked. Personal lifestyle choices, including: Daily care of your teeth and gums. Regular physical activity. Eating a healthy diet. Avoiding tobacco and drug use. Limiting alcohol use. Practicing safe sex. Taking low-dose aspirin every day. Taking vitamin and mineral supplements as recommended by your health care provider. What happens during an annual well check? The services and screenings done by your health care provider during your annual well check will depend on your age, overall health, lifestyle risk factors, and family history of disease. Counseling  Your health care provider may ask you questions about your: Alcohol use. Tobacco use. Drug use. Emotional well-being. Home and relationship well-being. Sexual activity. Eating habits. History of falls. Memory and ability to understand (cognition). Work and work astronomer. Reproductive health. Screening  You may have the following tests or measurements: Height, weight, and BMI. Blood  pressure. Lipid and cholesterol levels. These may be checked every 5 years, or more frequently if you are over 37 years old. Skin check. Lung cancer screening. You may have this screening every year starting at age 23 if you have a 30-pack-year history of smoking and currently smoke or have quit within the past 15 years. Fecal occult blood test (FOBT) of the stool. You may have this test every year starting at age 32. Flexible sigmoidoscopy or colonoscopy. You may have a sigmoidoscopy every 5 years or a colonoscopy every 10 years starting at age 43. Hepatitis C blood test. Hepatitis B blood test. Sexually transmitted disease (STD) testing. Diabetes screening. This is done by checking your blood sugar (glucose) after you have not eaten for a while (fasting). You may have this done every 1-3 years. Bone density scan. This is done to screen for osteoporosis. You may have this done starting at age 77. Mammogram. This may be done every 1-2 years. Talk to your health care provider about how often you should have regular mammograms. Talk with your health care provider about your test results, treatment options, and if necessary, the need for more tests. Vaccines  Your health care provider may recommend certain vaccines, such as: Influenza vaccine. This is recommended every year. Tetanus, diphtheria, and acellular pertussis (Tdap, Td) vaccine. You may need a Td booster every 10 years. Zoster vaccine. You may need this after age 19. Pneumococcal 13-valent conjugate (PCV13) vaccine. One dose is recommended after age 41. Pneumococcal polysaccharide (PPSV23) vaccine. One dose is recommended after age 12. Talk to your health care provider about which screenings and vaccines you need and how often you need them. This information is not intended to replace advice given to you by your health care provider.  Make sure you discuss any questions you have with your health care provider. Document Released:  03/28/2015 Document Revised: 11/19/2015 Document Reviewed: 12/31/2014 Elsevier Interactive Patient Education  2017 [employer].  Fall Prevention in the Home Falls can cause injuries. They can happen to people of all ages. There are many things you can do to make your home safe and to help prevent falls. What can I do on the outside of my home? Regularly fix the edges of walkways and driveways and fix any cracks. Remove anything that might make you trip as you walk through a door, such as a raised step or threshold. Trim any bushes or trees on the path to your home. Use bright outdoor lighting. Clear any walking paths of anything that might make someone trip, such as rocks or tools. Regularly check to see if handrails are loose or broken. Make sure that both sides of any steps have handrails. Any raised decks and porches should have guardrails on the edges. Have any leaves, snow, or ice cleared regularly. Use sand or salt on walking paths during winter. Clean up any spills in your garage right away. This includes oil or grease spills. What can I do in the bathroom? Use night lights. Install grab bars by the toilet and in the tub and shower. Do not use towel bars as grab bars. Use non-skid mats or decals in the tub or shower. If you need to sit down in the shower, use a plastic, non-slip stool. Keep the floor dry. Clean up any water that spills on the floor as soon as it happens. Remove soap buildup in the tub or shower regularly. Attach bath mats securely with double-sided non-slip rug tape. Do not have throw rugs and other things on the floor that can make you trip. What can I do in the bedroom? Use night lights. Make sure that you have a light by your bed that is easy to reach. Do not use any sheets or blankets that are too big for your bed. They should not hang down onto the floor. Have a firm chair that has side arms. You can use this for support while you get dressed. Do not have  throw rugs and other things on the floor that can make you trip. What can I do in the kitchen? Clean up any spills right away. Avoid walking on wet floors. Keep items that you use a lot in easy-to-reach places. If you need to reach something above you, use a strong step stool that has a grab bar. Keep electrical cords out of the way. Do not use floor polish or wax that makes floors slippery. If you must use wax, use non-skid floor wax. Do not have throw rugs and other things on the floor that can make you trip. What can I do with my stairs? Do not leave any items on the stairs. Make sure that there are handrails on both sides of the stairs and use them. Fix handrails that are broken or loose. Make sure that handrails are as long as the stairways. Check any carpeting to make sure that it is firmly attached to the stairs. Fix any carpet that is loose or worn. Avoid having throw rugs at the top or bottom of the stairs. If you do have throw rugs, attach them to the floor with carpet tape. Make sure that you have a light switch at the top of the stairs and the bottom of the stairs. If you do not have them,  ask someone to add them for you. What else can I do to help prevent falls? Wear shoes that: Do not have high heels. Have rubber bottoms. Are comfortable and fit you well. Are closed at the toe. Do not wear sandals. If you use a stepladder: Make sure that it is fully opened. Do not climb a closed stepladder. Make sure that both sides of the stepladder are locked into place. Ask someone to hold it for you, if possible. Clearly mark and make sure that you can see: Any grab bars or handrails. First and last steps. Where the edge of each step is. Use tools that help you move around (mobility aids) if they are needed. These include: Canes. Walkers. Scooters. Crutches. Turn on the lights when you go into a dark area. Replace any light bulbs as soon as they burn out. Set up your furniture so  you have a clear path. Avoid moving your furniture around. If any of your floors are uneven, fix them. If there are any pets around you, be aware of where they are. Review your medicines with your doctor. Some medicines can make you feel dizzy. This can increase your chance of falling. Ask your doctor what other things that you can do to help prevent falls. This information is not intended to replace advice given to you by your health care provider. Make sure you discuss any questions you have with your health care provider. Document Released: 12/26/2008 Document Revised: 08/07/2015 Document Reviewed: 04/05/2014 Elsevier Interactive Patient Education  2017 [employer].
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
## 2023-04-21 ENCOUNTER — Ambulatory Visit: Payer: Medicare Other | Admitting: Internal Medicine

## 2023-04-21 ENCOUNTER — Encounter: Payer: Self-pay | Admitting: Internal Medicine

## 2023-04-21 VITALS — BP 110/72 | HR 67 | Temp 98.6°F | Ht 62.0 in | Wt 164.0 lb

## 2023-04-21 DIAGNOSIS — G43109 Migraine with aura, not intractable, without status migrainosus: Secondary | ICD-10-CM | POA: Diagnosis not present

## 2023-04-21 DIAGNOSIS — E538 Deficiency of other specified B group vitamins: Secondary | ICD-10-CM

## 2023-04-21 DIAGNOSIS — J479 Bronchiectasis, uncomplicated: Secondary | ICD-10-CM

## 2023-04-21 DIAGNOSIS — I10 Essential (primary) hypertension: Secondary | ICD-10-CM | POA: Diagnosis not present

## 2023-04-21 DIAGNOSIS — E559 Vitamin D deficiency, unspecified: Secondary | ICD-10-CM | POA: Diagnosis not present

## 2023-04-21 DIAGNOSIS — R739 Hyperglycemia, unspecified: Secondary | ICD-10-CM

## 2023-04-21 DIAGNOSIS — J42 Unspecified chronic bronchitis: Secondary | ICD-10-CM

## 2023-04-21 MED ORDER — METFORMIN HCL 500 MG PO TABS: 500.0000 mg | ORAL_TABLET | Freq: Every day | ORAL | 3 refills | Status: DC

## 2023-04-21 MED ORDER — PROMETHAZINE HCL 12.5 MG PO TABS: 12.5000 mg | ORAL_TABLET | ORAL | 3 refills | Status: DC | PRN

## 2023-04-21 NOTE — Assessment & Plan Note (Signed)
 On B12

## 2023-04-21 NOTE — Assessment & Plan Note (Signed)
 Check A1c Pt gained wt

## 2023-04-21 NOTE — Assessment & Plan Note (Signed)
 Using a vibrating vest and a flutter valve

## 2023-04-21 NOTE — Assessment & Plan Note (Signed)
 Pre- DM  Loose wt, start Metformin  Cut back on carbs RTC 3 mo w/labs

## 2023-04-21 NOTE — Progress Notes (Signed)
 Subjective:  Patient ID: Veronica [name], female    DOB: 1950-09-27  Age: 73 y.o. MRN: [id number]  CC: Medical Management of Chronic Issues (3 mnth f/u)   HPI Veronica [name] presents for wt gain, LBP, chronic cough, migraine HAs  Outpatient Medications Prior to Visit  Medication Sig Dispense Refill   acetaminophen -codeine  (TYLENOL  #3) 300-30 MG tablet Take 1 tablet by mouth every 6 (six) hours as needed. 60 tablet 3   albuterol  (PROVENTIL ) (2.5 MG/3ML) 0.083% nebulizer solution USE 1 VIAL IN NEBULIZER 2 TIMES DAILY. Generic: VENTOLIN  180 mL 3   albuterol  (VENTOLIN  HFA) 108 (90 Base) MCG/ACT inhaler INHALE 2 PUFFS INTO THE LUNGS EVERY 4 HOURS AS NEEDED FOR WHEEZING OR SHORTNESS OF BREATH 8.5 g 1   aspirin 81 MG chewable tablet Chew by mouth daily.     atorvastatin  (LIPITOR) 40 MG tablet TAKE 1 TABLET BY MOUTH EVERY DAY 90 tablet 1   budesonide  (PULMICORT ) 0.5 MG/2ML nebulizer solution USE 1 VIAL IN NEBULIZER IN THE MORNING AND AT BEDTIME. Generic: Pulmicort  120 mL 11   calcium  carbonate (OSCAL) 1500 (600 Ca) MG TABS tablet Take 600 mg of elemental calcium  by mouth 2 (two) times daily with a meal.      cariprazine  (VRAYLAR ) 1.5 MG capsule Take 1 capsule (1.5 mg total) by mouth daily. [employer] @ [phone] Lot # [id number] Exp- [id number] 90 capsule 3   Cholecalciferol (VITAMIN D3) 50 MCG (2000 UT) capsule Take 1 capsule (2,000 Units total) by mouth daily. 100 capsule 3   clonazePAM  (KLONOPIN ) 1 MG tablet TAKE 2 TABLETS BY MOUTH EVERY NIGHT AT BEDTIME AND 1/2 TABLET EXTRA IN DAYTIME AS NEEDED 225 tablet 1   cyanocobalamin  (VITAMIN B12) 1000 MCG/ML injection INJECT 1 ML IN THE MUSCLE EVERY 14 DAYS 10 mL 5   diphenoxylate -atropine  (LOMOTIL ) 2.5-0.025 MG tablet Take 1 tablet by mouth 4 (four) times daily as needed for diarrhea or loose stools. 60 tablet 2   Docusate Calcium  (STOOL SOFTENER PO) Take 100 mg by mouth as needed (for constipation).     escitalopram  (LEXAPRO ) 20 MG tablet TAKE 1  TABLET(20 MG) BY MOUTH DAILY 90 tablet 3   ezetimibe  (ZETIA ) 10 MG tablet Take 1 tablet (10 mg total) by mouth daily. 90 tablet 3   furosemide  (LASIX ) 40 MG tablet Take 1 tablet (40 mg total) by mouth daily. 90 tablet 3   Insulin  Syringe-Needle U-100 (B-D INSULIN  SYRINGE 1CC/25GX1) 25G X 1 1 ML MISC USE AS DIRECTED EVERY 2 WEEKS 50 each 2   isosorbide  mononitrate (IMDUR ) 60 MG 24 hr tablet Take 1 tablet (60 mg total) by mouth daily. 90 tablet 3   levothyroxine  (SYNTHROID ) 88 MCG tablet TAKE 1 TABLET(88 MCG) BY MOUTH DAILY 90 tablet 3   metoprolol  tartrate (LOPRESSOR ) 50 MG tablet TAKE 1 TABLET 2 HOURS PRIOR TO TEST 1 tablet 0   Multiple Vitamins-Minerals (PRESERVISION AREDS 2 PO) Take by mouth.     nitroGLYCERIN  (NITROSTAT ) 0.4 MG SL tablet DISSOLVE 1 TABLET UNDER THE TONGUE AS DIRECTED. MAX 3 DOSES. CALL 911 IF NEEDING ALL 3 DOSES 25 tablet 2   nystatin  (MYCOSTATIN ) 100000 UNIT/ML suspension Take 5 mLs (500,000 Units total) by mouth 4 (four) times daily. 60 mL 0   Omega-3 Fatty Acids (FISH OIL) 1000 MG CAPS Take 1 capsule by mouth daily.     ondansetron  (ZOFRAN ) 4 MG tablet Take 1 tablet (4 mg total) by mouth every 8 (eight) hours as needed for nausea or vomiting. 30  tablet 2   phenytoin  (DILANTIN ) 100 MG ER capsule TAKE 2 CAPSULES BY MOUTH EVERY MORNING AND 1 CAPSULE BY MOUTH EVERY EVENING 270 capsule 3   phenytoin  (DILANTIN ) 100 MG ER capsule TAKE 2 CAPSULES BY MOUTH EVERY MORNING AND 1 CAPSULES EVERY EVENING 270 capsule 3   potassium chloride  SA (KLOR-CON  M) 20 MEQ tablet Take 1 tablet (20 mEq total) by mouth daily. 90 tablet 3   promethazine -dextromethorphan (PROMETHAZINE -DM) 6.25-15 MG/5ML syrup TAKE 5 ML BY MOUTH FOUR TIMES DAILY AS NEEDED FOR COUGH 720 mL 3   Respiratory Therapy Supplies (FLUTTER) DEVI Use after nebulization treatments 1 each 0   sodium chloride  (MURO 128) 5 % ophthalmic solution Place 1 drop into both eyes as needed for irritation.     SYRINGE-NEEDLE, DISP, 3 ML (BD  ECLIPSE SYRINGE) 25G X 1 3 ML MISC Use sq q 2 wks 50 each 3   traZODone  (DESYREL ) 50 MG tablet TAKE 4 TABLETS(200 MG) BY MOUTH AT BEDTIME 360 tablet 3   traZODone  (DESYREL ) 50 MG tablet TAKE 4 TABLETS(200 MG) BY MOUTH AT BEDTIME 360 tablet 3   verapamil  (CALAN -SR) 240 MG CR tablet TAKE 1 TABLET(240 MG) BY MOUTH AT BEDTIME 90 tablet 3   promethazine  (PHENERGAN ) 12.5 MG tablet Take by mouth.     No facility-administered medications prior to visit.    ROS: Review of Systems  Constitutional:  Positive for fatigue and unexpected weight change. Negative for activity change, appetite change and chills.  HENT:  Negative for congestion, mouth sores and sinus pressure.   Eyes:  Negative for visual disturbance.  Respiratory:  Positive for cough. Negative for chest tightness.   Gastrointestinal:  Negative for abdominal pain and nausea.  Genitourinary:  Negative for difficulty urinating, frequency and vaginal pain.  Musculoskeletal:  Positive for arthralgias, back pain and gait problem.  Skin:  Negative for pallor and rash.  Neurological:  Negative for dizziness, tremors, weakness, numbness and headaches.  Psychiatric/Behavioral:  Negative for confusion and sleep disturbance.     Objective:  BP 110/72 (BP Location: Left Arm, Patient Position: Sitting, Cuff Size: Normal)   Pulse 67   Temp 98.6 F (37 C) (Oral)   Ht 5' 2 (1.575 m)   Wt 164 lb (74.4 kg)   SpO2 98%   BMI 30.00 kg/m   BP Readings from Last 3 Encounters:  04/21/23 110/72  03/07/23 130/62  03/02/23 126/70    Wt Readings from Last 3 Encounters:  04/21/23 164 lb (74.4 kg)  03/07/23 162 lb 6.4 oz (73.7 kg)  03/02/23 163 lb 12.8 oz (74.3 kg)    Physical Exam Constitutional:      General: She is not in acute distress.    Appearance: She is well-developed. She is obese. She is not ill-appearing.  HENT:     Head: Normocephalic.     Right Ear: External ear normal.     Left Ear: External ear normal.     Nose: Nose normal.   Eyes:     General:        Right eye: No discharge.        Left eye: No discharge.     Conjunctiva/sclera: Conjunctivae normal.     Pupils: Pupils are equal, round, and reactive to light.  Neck:     Thyroid : No thyromegaly.     Vascular: No JVD.     Trachea: No tracheal deviation.  Cardiovascular:     Rate and Rhythm: Normal rate and regular rhythm.  Heart sounds: Normal heart sounds.  Pulmonary:     Effort: No respiratory distress.     Breath sounds: No stridor. No wheezing.  Abdominal:     General: Bowel sounds are normal. There is no distension.     Palpations: Abdomen is soft. There is no mass.     Tenderness: There is no abdominal tenderness. There is no guarding or rebound.  Musculoskeletal:        General: Tenderness present.     Cervical back: Normal range of motion and neck supple. No rigidity.     Right lower leg: No edema.     Left lower leg: No edema.  Lymphadenopathy:     Cervical: No cervical adenopathy.  Skin:    General: Skin is warm.     Findings: No erythema or rash.  Neurological:     Cranial Nerves: No cranial nerve deficit.     Motor: No abnormal muscle tone.     Coordination: Coordination normal.     Deep Tendon Reflexes: Reflexes normal.  Psychiatric:        Behavior: Behavior normal.        Thought Content: Thought content normal.        Judgment: Judgment normal.    Using a cane   Lab Results  Component Value Date   WBC 7.1 04/14/2023   HGB 13.1 04/14/2023   HCT 39.9 04/14/2023   PLT 329.0 04/14/2023   GLUCOSE 190 (H) 04/14/2023   CHOL 142 01/25/2023   TRIG 142.0 01/25/2023   HDL 48.40 01/25/2023   LDLDIRECT 165.0 09/26/2009   LDLCALC 65 01/25/2023   ALT 12 04/14/2023   AST 19 04/14/2023   NA 139 04/14/2023   K 3.9 04/14/2023   CL 102 04/14/2023   CREATININE 0.66 04/14/2023   BUN 8 04/14/2023   CO2 27 04/14/2023   TSH 1.66 04/14/2023   INR 1.0 12/18/2021   HGBA1C 5.8 04/14/2023    MR Abdomen W Wo Contrast Result Date:  12/29/2021 CLINICAL DATA:  Possible hepatic cirrhosis and indeterminate liver lesion on recent noncontrast chest CT. EXAM: MRI ABDOMEN WITHOUT AND WITH CONTRAST TECHNIQUE: Multiplanar multisequence MR imaging of the abdomen was performed both before and after the administration of intravenous contrast. CONTRAST:  7mL GADAVIST  GADOBUTROL  1 MMOL/ML IV SOLN COMPARISON:  Noncontrast chest CT on 09/14/2021 FINDINGS: Lower chest: No acute findings. Hepatobiliary: A few tiny sub-cm hepatic cysts are seen in the right and left hepatic lobes. No hepatic masses identified. No definite gross morphologic findings of cirrhosis identified. Prior cholecystectomy. No evidence of biliary obstruction. Pancreas:  No mass or inflammatory changes. Spleen:  Within normal limits in size and appearance. Adrenals/Urinary Tract: No suspicious masses identified. No evidence of hydronephrosis. Stomach/Bowel: Unremarkable. Vascular/Lymphatic: No pathologically enlarged lymph nodes identified. No acute vascular findings. Other:  None. Musculoskeletal:  No suspicious bone lesions identified. IMPRESSION: Several tiny sub-cm hepatic cysts. No evidence of hepatic neoplasm, or definite imaging signs of cirrhosis. Electronically Signed   By: Norleen DELENA Kil M.D.   On: 12/29/2021 15:42    Assessment & Plan:   Problem List Items Addressed This Visit     Complicated migraine (Chronic)   Using promethazine  prn and T#3      Vitamin B12 deficiency   On B12      Vitamin D  deficiency   Chronic  On Vit D      Essential hypertension   Chronic  Cont on Calan  SR      Chronic bronchitis (HCC)  Using a vibrating vest and a flutter valve      Bronchiectasis (HCC) - Primary   Using a vibrating vest and a flutter valve F/u w/Dr Hunsucker      Hyperglycemia   Pre- DM  Loose wt, start Metformin  Cut back on carbs RTC 3 mo w/labs      Relevant Orders   Comprehensive metabolic panel   Hemoglobin A1c      Meds ordered this  encounter  Medications   promethazine  (PHENERGAN ) 12.5 MG tablet    Sig: Take 1 tablet (12.5 mg total) by mouth every 4 (four) hours as needed for nausea or vomiting.    Dispense:  60 tablet    Refill:  3   metFORMIN  (GLUCOPHAGE ) 500 MG tablet    Sig: Take 1 tablet (500 mg total) by mouth daily with breakfast.    Dispense:  90 tablet    Refill:  3      Follow-up: Return in about 3 months (around 07/19/2023) for a follow-up visit.  Marolyn Noel, MD

## 2023-04-21 NOTE — Assessment & Plan Note (Signed)
 Using promethazine  prn and T#3

## 2023-04-21 NOTE — Assessment & Plan Note (Signed)
 Using a vibrating vest and a flutter valve F/u w/Dr [employer]

## 2023-04-21 NOTE — Assessment & Plan Note (Signed)
Chronic  ?On Vit D ?

## 2023-04-21 NOTE — Assessment & Plan Note (Signed)
Chronic  Cont on Calan SR

## 2023-04-23 ENCOUNTER — Other Ambulatory Visit: Payer: Self-pay | Admitting: Cardiovascular Disease

## 2023-04-23 DIAGNOSIS — E7800 Pure hypercholesterolemia, unspecified: Secondary | ICD-10-CM

## 2023-04-25 ENCOUNTER — Telehealth: Payer: Self-pay | Admitting: Cardiovascular Disease

## 2023-04-25 ENCOUNTER — Ambulatory Visit: Payer: Medicare Other | Admitting: Psychology

## 2023-04-25 DIAGNOSIS — F331 Major depressive disorder, recurrent, moderate: Secondary | ICD-10-CM

## 2023-04-25 DIAGNOSIS — E7800 Pure hypercholesterolemia, unspecified: Secondary | ICD-10-CM

## 2023-04-25 MED ORDER — EZETIMIBE 10 MG PO TABS: 10.0000 mg | ORAL_TABLET | Freq: Every day | ORAL | 3 refills | Status: DC

## 2023-04-25 NOTE — Telephone Encounter (Signed)
 Pt's medication was sent to pt's pharmacy as requested. Confirmation received.

## 2023-04-25 NOTE — Progress Notes (Signed)
 [facility] Counselor/Therapist Progress Note  Patient ID: Veronica [name], MRN: [id number],    Date: 04/25/2023  Time Spent: 12:00pm - 12:45pm    45 minutes   Treatment Type: Individual Therapy  Reported Symptoms: stress, loneliness  Mental Status Exam: Appearance:  Casual     Behavior: Appropriate  Motor: Normal  Speech/Language:  Normal Rate  Affect: Appropriate  Mood: normal  Thought process: normal  Thought content:   WNL  Sensory/Perceptual disturbances:   WNL  Orientation: oriented to person, place, time/date, and situation  Attention: Good  Concentration: Good  Memory: WNL  Fund of knowledge:  Good  Insight:   Good  Judgment:  Good  Impulse Control: Good   Risk Assessment: Danger to Self:  No Self-injurious Behavior: No Danger to Others: No Duty to Warn:no Physical Aggression / Violence:No  Access to Firearms a concern: No  Gang Involvement:No   Subjective: Pt present for face-to-face individual therapy via phone.  Pt consents to telehealth phone session and is aware of limitations of phone sessions. (Pt does not have access to video devices.) Location of pt: home Location of therapist: home office.  Pt talked about her health.  Pt saw her PCP and her sugar was elevated so she had to be prescribed metforman and was told to lose 10-15 lbs.   Pt is frustrated by having to have diet restrictions.   Pt is worrying about her health issues.   She is having trouble letting go of worry thoughts.  Worked on self compassion and coping strategies.   Worked on how pt can release worries.    Pt is working on taking things one day at a time.  Worked on self care strategies. Provided supportive therapy.    Interventions: Cognitive Behavioral Therapy and Interpersonal  Diagnosis:  F33.1  Plan of Care: Recommend ongoing therapy.   Pt participated in setting therapy goals and agrees with treatment plan.   Pt wants to have someone to talk to and improve  coping skills.   Plan to continue to meet every two weeks.    Treatment Plan (Treatment Plan Target Date: 06/21/2023) Client Abilities/Strengths  Pt is bright, engaging, and motivated for therapy.   Client Treatment Preferences  Individual therapy.  Client Statement of Needs  Improve coping skills.  Symptoms  Depressed or irritable mood. Lack of energy. Feelings of hopelessness, worthlessness, or inappropriate guilt. Low self-esteem. Unresolved grief issues.   Problems Addressed  Unipolar Depression Goals 1. Alleviate depressive symptoms and return to previous level of effective functioning. 2. Appropriately grieve the loss in order to normalize mood and to return to previously adaptive level of functioning. Objective Learn and implement behavioral strategies to overcome depression. Target Date: 2023-06-21 Frequency: Biweekly  Progress: 50 Modality: individual  Related Interventions Engage the client in "behavioral activation," increasing his/her activity level and contact with sources of reward, while identifying processes that inhibit activation.  Use behavioral techniques such as instruction, rehearsal, role-playing, role reversal, as needed, to facilitate activity in the client's daily life; reinforce success. Assist the client in developing skills that increase the likelihood of deriving pleasure from behavioral activation (e.g., assertiveness skills, developing an exercise plan, less internal/more external focus, increased social involvement); reinforce success. Objective Identify important people in life, past and present, and describe the quality, good and poor, of those relationships. Target Date: 2023-06-21 Frequency: Biweekly  Progress: 50 Modality: individual  Related Interventions Conduct Interpersonal Therapy beginning with the assessment of the client's "interpersonal inventory" of important  past and present relationships; develop a case formulation linking depression  to grief, interpersonal role disputes, role transitions, and/or interpersonal deficits). Objective Learn and implement problem-solving and decision-making skills. Target Date: 2023-06-21 Frequency: Biweekly  Progress: 50 Modality: individual  Related Interventions Conduct Problem-Solving Therapy using techniques such as psychoeducation, modeling, and role-playing to teach client problem-solving skills (i.e., defining a problem specifically, generating possible solutions, evaluating the pros and cons of each solution, selecting and implementing a plan of action, evaluating the efficacy of the plan, accepting or revising the plan); role-play application of the problem-solving skill to a real life issue. Encourage in the client the development of a positive problem orientation in which problems and solving them are viewed as a natural part of life and not something to be feared, despaired, or avoided. 3. Develop healthy interpersonal relationships that lead to the alleviation and help prevent the relapse of depression. 4. Develop healthy thinking patterns and beliefs about self, others, and the world that lead to the alleviation and help prevent the relapse of depression. 5. Recognize, accept, and cope with feelings of depression. Diagnosis F33.1  Conditions For Discharge Achievement of treatment goals and objectives   Willey Harrier, LCSW

## 2023-04-25 NOTE — Telephone Encounter (Signed)
*  STAT* If patient is at the pharmacy, call can be transferred to refill team.   1. Which medications need to be refilled? (please list name of each medication and dose if known)   ezetimibe  (ZETIA ) 10 MG tablet   2. Would you like to learn more about the convenience, safety, & potential cost savings by using the [facility]?   3. Are you open to using the [facility] (Type [facility]. ).  4. Which pharmacy/location (including street and city if local pharmacy) is medication to be sent to?  [id number] - [place], [place] - [address]   5. Do they need a 30 day or 90 day supply?   90 day  Patient stated she still has some medication left.

## 2023-04-26 LAB — HM DIABETES EYE EXAM

## 2023-05-06 ENCOUNTER — Ambulatory Visit
Admission: RE | Admit: 2023-05-06 | Discharge: 2023-05-06 | Disposition: A | Discharge status: Home / Self Care | Payer: Medicare Other | Source: Ambulatory Visit | Attending: Internal Medicine | Admitting: Internal Medicine

## 2023-05-06 DIAGNOSIS — Z1231 Encounter for screening mammogram for malignant neoplasm of breast: Secondary | ICD-10-CM

## 2023-05-09 ENCOUNTER — Ambulatory Visit (INDEPENDENT_AMBULATORY_CARE_PROVIDER_SITE_OTHER): Payer: Medicare Other | Admitting: Psychology

## 2023-05-09 DIAGNOSIS — F331 Major depressive disorder, recurrent, moderate: Secondary | ICD-10-CM | POA: Diagnosis not present

## 2023-05-09 NOTE — Progress Notes (Signed)
 [facility] Counselor/Therapist Progress Note  Patient ID: DASHAYLA THEISSEN, MRN: [id number],    Date: 05/09/2023  Time Spent: 12:00pm - 12:45pm    45 minutes   Treatment Type: Individual Therapy  Reported Symptoms: stress, loneliness  Mental Status Exam: Appearance:  Casual     Behavior: Appropriate  Motor: Normal  Speech/Language:  Normal Rate  Affect: Appropriate  Mood: normal  Thought process: normal  Thought content:   WNL  Sensory/Perceptual disturbances:   WNL  Orientation: oriented to person, place, time/date, and situation  Attention: Good  Concentration: Good  Memory: WNL  Fund of knowledge:  Good  Insight:   Good  Judgment:  Good  Impulse Control: Good   Risk Assessment: Danger to Self:  No Self-injurious Behavior: No Danger to Others: No Duty to Warn:no Physical Aggression / Violence:No  Access to Firearms a concern: No  Gang Involvement:No   Subjective: Pt present for face-to-face individual therapy via phone.  Pt consents to telehealth phone session and is aware of limitations of phone sessions. (Pt does not have access to video devices.) Location of pt: home Location of therapist: home office.  Pt talked about her health.   Pt has started a new medication and has lost 3 lbs.  Pt has been going to her doctors appointments.   Pt talked about getting a lot done organizing her papers and getting her taxes done.   Pt has been worrying about financial issues. She is having trouble letting go of worry thoughts.   Worked on how pt can release worries.    Worked on self compassion and coping strategies.  Pt is working on taking things one day at a time.  Worked on self care strategies. Provided supportive therapy.    Interventions: Cognitive Behavioral Therapy and Interpersonal  Diagnosis:  F33.1  Plan of Care: Recommend ongoing therapy.   Pt participated in setting therapy goals and agrees with treatment plan.   Pt wants to have someone to  talk to and improve coping skills.   Plan to continue to meet every two weeks.    Treatment Plan (Treatment Plan Target Date: 06/21/2023) Client Abilities/Strengths  Pt is bright, engaging, and motivated for therapy.   Client Treatment Preferences  Individual therapy.  Client Statement of Needs  Improve coping skills.  Symptoms  Depressed or irritable mood. Lack of energy. Feelings of hopelessness, worthlessness, or inappropriate guilt. Low self-esteem. Unresolved grief issues.   Problems Addressed  Unipolar Depression Goals 1. Alleviate depressive symptoms and return to previous level of effective functioning. 2. Appropriately grieve the loss in order to normalize mood and to return to previously adaptive level of functioning. Objective Learn and implement behavioral strategies to overcome depression. Target Date: 2023-06-21 Frequency: Biweekly  Progress: 50 Modality: individual  Related Interventions Engage the client in "behavioral activation," increasing his/her activity level and contact with sources of reward, while identifying processes that inhibit activation.  Use behavioral techniques such as instruction, rehearsal, role-playing, role reversal, as needed, to facilitate activity in the client's daily life; reinforce success. Assist the client in developing skills that increase the likelihood of deriving pleasure from behavioral activation (e.g., assertiveness skills, developing an exercise plan, less internal/more external focus, increased social involvement); reinforce success. Objective Identify important people in life, past and present, and describe the quality, good and poor, of those relationships. Target Date: 2023-06-21 Frequency: Biweekly  Progress: 50 Modality: individual  Related Interventions Conduct Interpersonal Therapy beginning with the assessment of the client's "interpersonal inventory"  of important past and present relationships; develop a case formulation  linking depression to grief, interpersonal role disputes, role transitions, and/or interpersonal deficits). Objective Learn and implement problem-solving and decision-making skills. Target Date: 2023-06-21 Frequency: Biweekly  Progress: 50 Modality: individual  Related Interventions Conduct Problem-Solving Therapy using techniques such as psychoeducation, modeling, and role-playing to teach client problem-solving skills (i.e., defining a problem specifically, generating possible solutions, evaluating the pros and cons of each solution, selecting and implementing a plan of action, evaluating the efficacy of the plan, accepting or revising the plan); role-play application of the problem-solving skill to a real life issue. Encourage in the client the development of a positive problem orientation in which problems and solving them are viewed as a natural part of life and not something to be feared, despaired, or avoided. 3. Develop healthy interpersonal relationships that lead to the alleviation and help prevent the relapse of depression. 4. Develop healthy thinking patterns and beliefs about self, others, and the world that lead to the alleviation and help prevent the relapse of depression. 5. Recognize, accept, and cope with feelings of depression. Diagnosis F33.1  Conditions For Discharge Achievement of treatment goals and objectives   Salomon Fick, LCSW

## 2023-05-12 ENCOUNTER — Ambulatory Visit: Payer: Medicare Other | Admitting: Pulmonary Disease

## 2023-05-12 ENCOUNTER — Encounter: Payer: Self-pay | Admitting: Pulmonary Disease

## 2023-05-12 VITALS — BP 124/74 | HR 70 | Temp 97.9°F | Ht 62.0 in | Wt 162.0 lb

## 2023-05-12 DIAGNOSIS — J471 Bronchiectasis with (acute) exacerbation: Secondary | ICD-10-CM | POA: Diagnosis not present

## 2023-05-12 DIAGNOSIS — J4489 Other specified chronic obstructive pulmonary disease: Secondary | ICD-10-CM | POA: Diagnosis not present

## 2023-05-12 MED ORDER — PROMETHAZINE-DM 6.25-15 MG/5ML PO SYRP: ORAL_SOLUTION | ORAL | 3 refills | Status: DC

## 2023-05-12 MED ORDER — LEVOFLOXACIN 500 MG PO TABS: 500.0000 mg | ORAL_TABLET | Freq: Every day | ORAL | 0 refills | Status: AC

## 2023-05-12 NOTE — Patient Instructions (Signed)
 Continue the best and flutter valve at least 2 times a day, the cough is worse use 2-4 times a day  No changes to the nebulizer medications  Take levofloxacin 1 tablet once a day for 10 days, see if this helps with the congestion and cough  Promethazine cough syrup refilled  Return to clinic in 3 months or sooner as needed with Dr. Judeth Horn
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
## 2023-05-16 ENCOUNTER — Other Ambulatory Visit: Payer: Self-pay | Admitting: Internal Medicine

## 2023-05-23 ENCOUNTER — Ambulatory Visit: Payer: Medicare Other | Admitting: Psychology

## 2023-05-23 DIAGNOSIS — F331 Major depressive disorder, recurrent, moderate: Secondary | ICD-10-CM

## 2023-05-23 NOTE — Progress Notes (Signed)
 [facility] Counselor/Therapist Progress Note  Patient ID: MAYO OWCZARZAK, MRN: [id number],    Date: 05/23/2023  Time Spent: 12:00pm - 12:45pm    45 minutes   Treatment Type: Individual Therapy  Reported Symptoms: stress, loneliness  Mental Status Exam: Appearance:  Casual     Behavior: Appropriate  Motor: Normal  Speech/Language:  Normal Rate  Affect: Appropriate  Mood: normal  Thought process: normal  Thought content:   WNL  Sensory/Perceptual disturbances:   WNL  Orientation: oriented to person, place, time/date, and situation  Attention: Good  Concentration: Good  Memory: WNL  Fund of knowledge:  Good  Insight:   Good  Judgment:  Good  Impulse Control: Good   Risk Assessment: Danger to Self:  No Self-injurious Behavior: No Danger to Others: No Duty to Warn:no Physical Aggression / Violence:No  Access to Firearms a concern: No  Gang Involvement:No   Subjective: Pt present for face-to-face individual therapy via phone.  Pt consents to telehealth phone session and is aware of limitations of phone sessions. (Pt does not have access to video devices.) Location of pt: home Location of therapist: home office.  Pt talked about her finances.   She was trying to get an account rolled over at her bank and there has been hold ups that are frustrating for pt.  She thinks the issue will be resolved within a week.   Pt has had car troubles that have been stressful.  The repairs were expensive as well.  She is having trouble letting go of worry thoughts.   Worked on how pt can release worries.    Worked on self compassion and coping strategies.  Pt is working on taking things one day at a time.   Pt is doing her word search puzzles and straightening up her house.   Worked on self care strategies. Provided supportive therapy.    Interventions: Cognitive Behavioral Therapy and Interpersonal  Diagnosis:  F33.1  Plan of Care: Recommend ongoing therapy.   Pt  participated in setting therapy goals and agrees with treatment plan.   Pt wants to have someone to talk to and improve coping skills.   Plan to continue to meet every two weeks.    Treatment Plan (Treatment Plan Target Date: 06/21/2023) Client Abilities/Strengths  Pt is bright, engaging, and motivated for therapy.   Client Treatment Preferences  Individual therapy.  Client Statement of Needs  Improve coping skills.  Symptoms  Depressed or irritable mood. Lack of energy. Feelings of hopelessness, worthlessness, or inappropriate guilt. Low self-esteem. Unresolved grief issues.   Problems Addressed  Unipolar Depression Goals 1. Alleviate depressive symptoms and return to previous level of effective functioning. 2. Appropriately grieve the loss in order to normalize mood and to return to previously adaptive level of functioning. Objective Learn and implement behavioral strategies to overcome depression. Target Date: 2023-06-21 Frequency: Biweekly  Progress: 50 Modality: individual  Related Interventions Engage the client in "behavioral activation," increasing his/her activity level and contact with sources of reward, while identifying processes that inhibit activation.  Use behavioral techniques such as instruction, rehearsal, role-playing, role reversal, as needed, to facilitate activity in the client's daily life; reinforce success. Assist the client in developing skills that increase the likelihood of deriving pleasure from behavioral activation (e.g., assertiveness skills, developing an exercise plan, less internal/more external focus, increased social involvement); reinforce success. Objective Identify important people in life, past and present, and describe the quality, good and poor, of those relationships. Target Date: 2023-06-21  Frequency: Biweekly  Progress: 50 Modality: individual  Related Interventions Conduct Interpersonal Therapy beginning with the assessment of the  client's "interpersonal inventory" of important past and present relationships; develop a case formulation linking depression to grief, interpersonal role disputes, role transitions, and/or interpersonal deficits). Objective Learn and implement problem-solving and decision-making skills. Target Date: 2023-06-21 Frequency: Biweekly  Progress: 50 Modality: individual  Related Interventions Conduct Problem-Solving Therapy using techniques such as psychoeducation, modeling, and role-playing to teach client problem-solving skills (i.e., defining a problem specifically, generating possible solutions, evaluating the pros and cons of each solution, selecting and implementing a plan of action, evaluating the efficacy of the plan, accepting or revising the plan); role-play application of the problem-solving skill to a real life issue. Encourage in the client the development of a positive problem orientation in which problems and solving them are viewed as a natural part of life and not something to be feared, despaired, or avoided. 3. Develop healthy interpersonal relationships that lead to the alleviation and help prevent the relapse of depression. 4. Develop healthy thinking patterns and beliefs about self, others, and the world that lead to the alleviation and help prevent the relapse of depression. 5. Recognize, accept, and cope with feelings of depression. Diagnosis F33.1  Conditions For Discharge Achievement of treatment goals and objectives   Salomon Fick, LCSW

## 2023-06-06 ENCOUNTER — Ambulatory Visit (INDEPENDENT_AMBULATORY_CARE_PROVIDER_SITE_OTHER): Payer: Medicare Other | Admitting: Psychology

## 2023-06-06 ENCOUNTER — Other Ambulatory Visit: Payer: Self-pay | Admitting: Internal Medicine

## 2023-06-06 DIAGNOSIS — F331 Major depressive disorder, recurrent, moderate: Secondary | ICD-10-CM

## 2023-06-06 NOTE — Progress Notes (Signed)
 [facility] Counselor/Therapist Progress Note  Patient ID: TYARRA NOLTON, MRN: [id number],    Date: 06/06/2023  Time Spent: 12:00pm - 12:45pm    45 minutes   Treatment Type: Individual Therapy  Reported Symptoms: stress, loneliness  Mental Status Exam: Appearance:  Casual     Behavior: Appropriate  Motor: Normal  Speech/Language:  Normal Rate  Affect: Appropriate  Mood: normal  Thought process: normal  Thought content:   WNL  Sensory/Perceptual disturbances:   WNL  Orientation: oriented to person, place, time/date, and situation  Attention: Good  Concentration: Good  Memory: WNL  Fund of knowledge:  Good  Insight:   Good  Judgment:  Good  Impulse Control: Good   Risk Assessment: Danger to Self:  No Self-injurious Behavior: No Danger to Others: No Duty to Warn:no Physical Aggression / Violence:No  Access to Firearms a concern: No  Gang Involvement:No   Subjective: Pt present for face-to-face individual therapy via phone.  Pt consents to telehealth phone session and is aware of limitations of phone sessions. (Pt does not have access to video devices.) Location of pt: home Location of therapist: home office.  Pt talked about having worry thoughts about death and if anyone would come to her funeral.  Addressed pt's thoughts and what triggered her worries about death.   She is having trouble letting go of worry thoughts.   Worked on how pt can release worries.    Worked on self compassion and coping strategies.  Pt is working on taking things one day at a time.   Pt is doing her word search puzzles and straightening up her house.   Pt talked about her health.  She has been having migraines and will see her doctor in April.  Pt has lost 11 lbs bc she has been eating more healthy.   Worked on self care strategies. Provided supportive therapy.    Interventions: Cognitive Behavioral Therapy and Interpersonal  Diagnosis:  F33.1  Plan of Care: Recommend  ongoing therapy.   Pt participated in setting therapy goals and agrees with treatment plan.   Pt wants to have someone to talk to and improve coping skills.   Plan to continue to meet every two weeks.    Treatment Plan (Treatment Plan Target Date: 06/21/2023) Client Abilities/Strengths  Pt is bright, engaging, and motivated for therapy.   Client Treatment Preferences  Individual therapy.  Client Statement of Needs  Improve coping skills.  Symptoms  Depressed or irritable mood. Lack of energy. Feelings of hopelessness, worthlessness, or inappropriate guilt. Low self-esteem. Unresolved grief issues.   Problems Addressed  Unipolar Depression Goals 1. Alleviate depressive symptoms and return to previous level of effective functioning. 2. Appropriately grieve the loss in order to normalize mood and to return to previously adaptive level of functioning. Objective Learn and implement behavioral strategies to overcome depression. Target Date: 2023-06-21 Frequency: Biweekly  Progress: 50 Modality: individual  Related Interventions Engage the client in "behavioral activation," increasing his/her activity level and contact with sources of reward, while identifying processes that inhibit activation.  Use behavioral techniques such as instruction, rehearsal, role-playing, role reversal, as needed, to facilitate activity in the client's daily life; reinforce success. Assist the client in developing skills that increase the likelihood of deriving pleasure from behavioral activation (e.g., assertiveness skills, developing an exercise plan, less internal/more external focus, increased social involvement); reinforce success. Objective Identify important people in life, past and present, and describe the quality, good and poor, of those relationships. Target  Date: 2023-06-21 Frequency: Biweekly  Progress: 50 Modality: individual  Related Interventions Conduct Interpersonal Therapy beginning with the  assessment of the client's "interpersonal inventory" of important past and present relationships; develop a case formulation linking depression to grief, interpersonal role disputes, role transitions, and/or interpersonal deficits). Objective Learn and implement problem-solving and decision-making skills. Target Date: 2023-06-21 Frequency: Biweekly  Progress: 50 Modality: individual  Related Interventions Conduct Problem-Solving Therapy using techniques such as psychoeducation, modeling, and role-playing to teach client problem-solving skills (i.e., defining a problem specifically, generating possible solutions, evaluating the pros and cons of each solution, selecting and implementing a plan of action, evaluating the efficacy of the plan, accepting or revising the plan); role-play application of the problem-solving skill to a real life issue. Encourage in the client the development of a positive problem orientation in which problems and solving them are viewed as a natural part of life and not something to be feared, despaired, or avoided. 3. Develop healthy interpersonal relationships that lead to the alleviation and help prevent the relapse of depression. 4. Develop healthy thinking patterns and beliefs about self, others, and the world that lead to the alleviation and help prevent the relapse of depression. 5. Recognize, accept, and cope with feelings of depression. Diagnosis F33.1  Conditions For Discharge Achievement of treatment goals and objectives   Salomon Fick, LCSW

## 2023-06-07 ENCOUNTER — Other Ambulatory Visit: Payer: Self-pay | Admitting: Internal Medicine

## 2023-06-12 ENCOUNTER — Other Ambulatory Visit: Payer: Self-pay | Admitting: Internal Medicine

## 2023-06-14 ENCOUNTER — Telehealth: Payer: Self-pay | Admitting: Internal Medicine

## 2023-06-14 NOTE — Telephone Encounter (Unsigned)
 Copied from CRM [id number]. Topic: Clinical - Medication Refill >> Jun 14, 2023 12:34 PM Shereese L wrote: Most Recent Primary Care Visit:  Provider: Tresa Garter  Department: [facility]  Visit Type: OFFICE VISIT  Date: 04/21/2023  Medication: cariprazine (VRAYLAR) 1.5 MG capsule  Has the patient contacted their pharmacy? Yes (Agent: If no, request that the patient contact the pharmacy for the refill. If patient does not wish to contact the pharmacy document the reason why and proceed with request.) (Agent: If yes, when and what did the pharmacy advise?)  Is this the correct pharmacy for this prescription? No If no, delete pharmacy and type the correct one.  This is the patient's preferred pharmacy:  [id number] - [place], [place] - [address] AT [facility] [address] [place] [address] Phone: [phone] Fax: [phone]  [facility] - [place], [place] - [address] Shelva Majestic [address] [place]olyn [place] [address] Phone: [phone] Fax: [phone]   Has the prescription been filled recently? Yes  Is the patient out of the medication? Yes  Has the patient been seen for an appointment in the last year OR does the patient have an upcoming appointment? Yes  Can we respond through MyChart? Yes  Agent: Please be advised that Rx refills may take up to 3 business days. We ask that you follow-up with your pharmacy.

## 2023-06-17 ENCOUNTER — Telehealth: Payer: Self-pay | Admitting: Internal Medicine

## 2023-06-17 NOTE — Telephone Encounter (Addendum)
 Pt needs an update on her [employer] pt assistance forms. She states [employer] told her it was completed but we could not find any notes or documentation confirming this.  Last set of PW was in January (see phone note 1.10.25)  [employer]: [phone]   cariprazine (VRAYLAR) 1.5 MG capsule   Pt received a 5 week supply of medication samples from office today and it was logged in the sample book by Selena Batten.   Please look into this and contact pt.

## 2023-06-17 NOTE — Telephone Encounter (Signed)
 Patient walked into office today, as she is out of medication.  We will follow up on her patient assistance that was start in Jan. 2025 and patient was provided 5 weeks of samples in the meantime of her Vraylar medication.  No addition request at this time.

## 2023-06-20 ENCOUNTER — Ambulatory Visit (INDEPENDENT_AMBULATORY_CARE_PROVIDER_SITE_OTHER): Payer: Medicare Other | Admitting: Psychology

## 2023-06-20 DIAGNOSIS — F331 Major depressive disorder, recurrent, moderate: Secondary | ICD-10-CM | POA: Diagnosis not present

## 2023-06-20 NOTE — Progress Notes (Signed)
 [name] Counselor/Therapist Progress Note  Patient ID: Veronica [name], MRN: [id number],    Date: 06/20/2023  Time Spent: 12:00pm - 12:45pm    45 minutes   Treatment Type: Individual Therapy  Reported Symptoms: stress, loneliness  Mental Status Exam: Appearance:  Casual     Behavior: Appropriate  Motor: Normal  Speech/Language:  Normal Rate  Affect: Appropriate  Mood: normal  Thought process: normal  Thought content:   WNL  Sensory/Perceptual disturbances:   WNL  Orientation: oriented to person, place, time/date, and situation  Attention: Good  Concentration: Good  Memory: WNL  Fund of knowledge:  Good  Insight:   Good  Judgment:  Good  Impulse Control: Good   Risk Assessment: Danger to Self:  No Self-injurious Behavior: No Danger to Others: No Duty to Warn:no Physical Aggression / Violence:No  Access to Firearms a concern: No  Gang Involvement:No   Subjective: Pt present for face-to-face individual therapy via phone.  Pt consents to telehealth phone session and is aware of limitations of phone sessions. (Pt does not have access to video devices.) Location of pt: home Location of therapist: home office.  Pt talked about being busy this morning.  She got a lot done and feels good about being productive.  She is working on letting go of worry thoughts.   Worked on how pt can release worries.    Worked on self compassion and coping strategies.  Pt is working on taking things one day at a time.   Pt is doing her word search puzzles and straightening up her house.   Pt talked about her health.  She is continuing to eat healthy and wants to lose 5 more lbs.  Worked on self care strategies. Provided supportive therapy.    Interventions: Cognitive Behavioral Therapy and Interpersonal  Diagnosis:  F33.1  Plan of Care: Recommend ongoing therapy.   Pt participated in setting therapy goals and agrees with treatment plan.   Pt wants to have someone to talk  to and improve coping skills.   Plan to continue to meet every two weeks.    Treatment Plan (Treatment Plan Target Date: 06/21/2023) Client Abilities/Strengths  Pt is bright, engaging, and motivated for therapy.   Client Treatment Preferences  Individual therapy.  Client Statement of Needs  Improve coping skills.  Symptoms  Depressed or irritable mood. Lack of energy. Feelings of hopelessness, worthlessness, or inappropriate guilt. Low self-esteem. Unresolved grief issues.   Problems Addressed  Unipolar Depression Goals 1. Alleviate depressive symptoms and return to previous level of effective functioning. 2. Appropriately grieve the loss in order to normalize mood and to return to previously adaptive level of functioning. Objective Learn and implement behavioral strategies to overcome depression. Target Date: 2023-06-21 Frequency: Biweekly  Progress: 50 Modality: individual  Related Interventions Engage the client in "behavioral activation," increasing his/her activity level and contact with sources of reward, while identifying processes that inhibit activation.  Use behavioral techniques such as instruction, rehearsal, role-playing, role reversal, as needed, to facilitate activity in the client's daily life; reinforce success. Assist the client in developing skills that increase the likelihood of deriving pleasure from behavioral activation (e.g., assertiveness skills, developing an exercise plan, less internal/more external focus, increased social involvement); reinforce success. Objective Identify important people in life, past and present, and describe the quality, good and poor, of those relationships. Target Date: 2023-06-21 Frequency: Biweekly  Progress: 50 Modality: individual  Related Interventions Conduct Interpersonal Therapy beginning with the assessment of the client's "  interpersonal inventory" of important past and present relationships; develop a case formulation  linking depression to grief, interpersonal role disputes, role transitions, and/or interpersonal deficits). Objective Learn and implement problem-solving and decision-making skills. Target Date: 2023-06-21 Frequency: Biweekly  Progress: 50 Modality: individual  Related Interventions Conduct Problem-Solving Therapy using techniques such as psychoeducation, modeling, and role-playing to teach client problem-solving skills (i.e., defining a problem specifically, generating possible solutions, evaluating the pros and cons of each solution, selecting and implementing a plan of action, evaluating the efficacy of the plan, accepting or revising the plan); role-play application of the problem-solving skill to a real life issue. Encourage in the client the development of a positive problem orientation in which problems and solving them are viewed as a natural part of life and not something to be feared, despaired, or avoided. 3. Develop healthy interpersonal relationships that lead to the alleviation and help prevent the relapse of depression. 4. Develop healthy thinking patterns and beliefs about self, others, and the world that lead to the alleviation and help prevent the relapse of depression. 5. Recognize, accept, and cope with feelings of depression. Diagnosis F33.1  Conditions For Discharge Achievement of treatment goals and objectives   Salomon Fick, LCSW

## 2023-06-22 ENCOUNTER — Other Ambulatory Visit: Payer: Self-pay | Admitting: Internal Medicine

## 2023-07-04 ENCOUNTER — Ambulatory Visit (INDEPENDENT_AMBULATORY_CARE_PROVIDER_SITE_OTHER): Payer: Medicare Other | Admitting: Psychology

## 2023-07-04 DIAGNOSIS — F331 Major depressive disorder, recurrent, moderate: Secondary | ICD-10-CM | POA: Diagnosis not present
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
## 2023-07-05 ENCOUNTER — Other Ambulatory Visit (INDEPENDENT_AMBULATORY_CARE_PROVIDER_SITE_OTHER)

## 2023-07-05 DIAGNOSIS — R739 Hyperglycemia, unspecified: Secondary | ICD-10-CM

## 2023-07-05 LAB — COMPREHENSIVE METABOLIC PANEL WITH GFR
ALT: 20 U/L (ref 0–35)
AST: 25 U/L (ref 0–37)
Albumin: 4.2 g/dL (ref 3.5–5.2)
Alkaline Phosphatase: 131 U/L — ABNORMAL HIGH (ref 39–117)
BUN: 8 mg/dL (ref 6–23)
CO2: 30 meq/L (ref 19–32)
Calcium: 7.6 mg/dL — ABNORMAL LOW (ref 8.4–10.5)
Chloride: 96 meq/L (ref 96–112)
Creatinine, Ser: 0.7 mg/dL (ref 0.40–1.20)
GFR: 85.28 mL/min (ref >60.00)
Glucose, Bld: 152 mg/dL — ABNORMAL HIGH (ref 70–99)
Potassium: 3.1 meq/L — ABNORMAL LOW (ref 3.5–5.1)
Sodium: 139 meq/L (ref 135–145)
Total Bilirubin: 0.3 mg/dL (ref 0.2–1.2)
Total Protein: 7.4 g/dL (ref 6.0–8.3)

## 2023-07-05 LAB — HEMOGLOBIN A1C: Hgb A1c MFr Bld: 6 % (ref 4.6–6.5)

## 2023-07-07 ENCOUNTER — Other Ambulatory Visit: Payer: Self-pay | Admitting: Pulmonary Disease

## 2023-07-07 NOTE — Telephone Encounter (Signed)
 Spoke with patient, I was able to reach out to the patient assistance company she uses and get her refills processed.

## 2023-07-12 ENCOUNTER — Encounter: Payer: Self-pay | Admitting: Pulmonary Disease

## 2023-07-12 ENCOUNTER — Ambulatory Visit: Payer: Medicare Other | Admitting: Pulmonary Disease

## 2023-07-12 VITALS — BP 114/74 | HR 75 | Ht 62.0 in | Wt 152.2 lb

## 2023-07-12 DIAGNOSIS — J471 Bronchiectasis with (acute) exacerbation: Secondary | ICD-10-CM

## 2023-07-12 DIAGNOSIS — J42 Unspecified chronic bronchitis: Secondary | ICD-10-CM

## 2023-07-12 MED ORDER — LEVOFLOXACIN 500 MG PO TABS: 500.0000 mg | ORAL_TABLET | Freq: Every day | ORAL | 0 refills | Status: AC
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
## 2023-07-12 NOTE — Patient Instructions (Signed)
 Take levofloxacin  1 tablet every day for 10 days see if this helps the cough  If not improving by next week please call the office we may need to consider low doses of prednisone  to see if it will help  No other changes in medicine  Return to clinic in 3 months or sooner as needed with Dr. Marygrace Snellen

## 2023-07-14 ENCOUNTER — Ambulatory Visit: Payer: Medicare Other | Admitting: Internal Medicine

## 2023-07-14 ENCOUNTER — Encounter: Payer: Self-pay | Admitting: Internal Medicine

## 2023-07-14 VITALS — BP 118/62 | HR 82 | Temp 98.2°F | Ht 62.0 in | Wt 151.8 lb

## 2023-07-14 DIAGNOSIS — J209 Acute bronchitis, unspecified: Secondary | ICD-10-CM

## 2023-07-14 DIAGNOSIS — E538 Deficiency of other specified B group vitamins: Secondary | ICD-10-CM | POA: Diagnosis not present

## 2023-07-14 DIAGNOSIS — I69351 Hemiplegia and hemiparesis following cerebral infarction affecting right dominant side: Secondary | ICD-10-CM | POA: Diagnosis not present

## 2023-07-14 DIAGNOSIS — E876 Hypokalemia: Secondary | ICD-10-CM | POA: Diagnosis not present

## 2023-07-14 DIAGNOSIS — E785 Hyperlipidemia, unspecified: Secondary | ICD-10-CM

## 2023-07-14 MED ORDER — KETOCONAZOLE 2 % EX CREA: 1.0000 | TOPICAL_CREAM | Freq: Two times a day (BID) | CUTANEOUS | 2 refills | Status: AC

## 2023-07-14 MED ORDER — NITROGLYCERIN 0.4 MG SL SUBL: SUBLINGUAL_TABLET | SUBLINGUAL | 3 refills | Status: AC

## 2023-07-14 MED ORDER — POTASSIUM CHLORIDE CRYS ER 10 MEQ PO TBCR: 10.0000 meq | EXTENDED_RELEASE_TABLET | Freq: Every day | ORAL | 0 refills | Status: DC

## 2023-07-14 MED ORDER — METHYLPREDNISOLONE ACETATE 80 MG/ML IJ SUSP
80.0000 mg | Freq: Once | INTRAMUSCULAR | Status: AC
  Administered 2023-07-14: 80 mg via INTRAMUSCULAR

## 2023-07-14 NOTE — Assessment & Plan Note (Signed)
 Take extra KCl x 2 wks (10 mg)   On K-Dur 20 milligrams daily

## 2023-07-14 NOTE — Assessment & Plan Note (Signed)
Using a cane 

## 2023-07-14 NOTE — Assessment & Plan Note (Addendum)
 On Levaquin  Depo-Medrol  IM 80 mg IM

## 2023-07-14 NOTE — Progress Notes (Signed)
 Subjective:  Patient ID: Veronica [name], female    DOB: 03/09/1951  Age: 73 y.o. MRN: [id number]  CC: Medical Management of Chronic Issues (3 Month Follow Up. Concerns with potassium levels, patient notes she does take her potassium supplement daily)   HPI Amyah Joswick Summerson presents for SOB, bronchitis - on Levaquin  x10 d F/u on dyslipidemia, LBP, HTN, low K  Outpatient Medications Prior to Visit  Medication Sig Dispense Refill   acetaminophen -codeine  (TYLENOL  #3) 300-30 MG tablet TAKE 1 TABLET BY MOUTH EVERY 6 HOURS AS NEEDED 60 tablet 0   albuterol  (PROVENTIL ) (2.5 MG/3ML) 0.083% nebulizer solution USE 1 VIAL IN NEBULIZER 2 TIMES DAILY. Generic: VENTOLIN  180 mL 10   albuterol  (VENTOLIN  HFA) 108 (90 Base) MCG/ACT inhaler INHALE 2 PUFFS INTO THE LUNGS EVERY 4 HOURS AS NEEDED FOR WHEEZING OR SHORTNESS OF BREATH 6.7 g 5   aspirin 81 MG chewable tablet Chew by mouth daily.     atorvastatin  (LIPITOR) 40 MG tablet TAKE 1 TABLET BY MOUTH EVERY DAY 90 tablet 1   budesonide  (PULMICORT ) 0.5 MG/2ML nebulizer solution USE 1 VIAL IN NEBULIZER IN THE MORNING AND AT BEDTIME. Generic: Pulmicort  120 mL 11   calcium  carbonate (OSCAL) 1500 (600 Ca) MG TABS tablet Take 600 mg of elemental calcium  by mouth 2 (two) times daily with a meal.      cariprazine  (VRAYLAR ) 1.5 MG capsule Take 1 capsule (1.5 mg total) by mouth daily. [employer] @ [phone] Lot # [id number] Exp- [id number] 90 capsule 3   Cholecalciferol (VITAMIN D3) 50 MCG (2000 UT) capsule Take 1 capsule (2,000 Units total) by mouth daily. 100 capsule 3   clonazePAM  (KLONOPIN ) 1 MG tablet TAKE 2 TABLETS BY MOUTH EVERY NIGHT AT BEDTIME AND 1/2 TABLET EXTRA IN DAYTIME AS NEEDED 225 tablet 1   cyanocobalamin  (VITAMIN B12) 1000 MCG/ML injection INJECT 1 ML IN THE MUSCLE EVERY 14 DAYS 10 mL 5   diphenoxylate -atropine  (LOMOTIL ) 2.5-0.025 MG tablet Take 1 tablet by mouth 4 (four) times daily as needed for diarrhea or loose stools. 60 tablet 2   Docusate  Calcium  (STOOL SOFTENER PO) Take 100 mg by mouth as needed (for constipation).     escitalopram  (LEXAPRO ) 20 MG tablet TAKE 1 TABLET(20 MG) BY MOUTH DAILY 90 tablet 3   ezetimibe  (ZETIA ) 10 MG tablet Take 1 tablet (10 mg total) by mouth daily. 90 tablet 3   furosemide  (LASIX ) 40 MG tablet Take 1 tablet (40 mg total) by mouth daily. 90 tablet 3   Insulin  Syringe-Needle U-100 (B-D INSULIN  SYRINGE 1CC/25GX1") 25G X 1" 1 ML MISC USE AS DIRECTED EVERY 2 WEEKS 50 each 2   isosorbide  mononitrate (IMDUR ) 60 MG 24 hr tablet Take 1 tablet (60 mg total) by mouth daily. 90 tablet 3   levofloxacin  (LEVAQUIN ) 500 MG tablet Take 1 tablet (500 mg total) by mouth daily for 10 days. 10 tablet 0   levothyroxine  (SYNTHROID ) 88 MCG tablet TAKE 1 TABLET(88 MCG) BY MOUTH DAILY 90 tablet 3   metFORMIN  (GLUCOPHAGE ) 500 MG tablet Take 1 tablet (500 mg total) by mouth daily with breakfast. 90 tablet 3   metoprolol  tartrate (LOPRESSOR ) 50 MG tablet TAKE 1 TABLET 2 HOURS PRIOR TO TEST 1 tablet 0   Multiple Vitamins-Minerals (PRESERVISION AREDS 2 PO) Take by mouth.     nystatin  (MYCOSTATIN ) 100000 UNIT/ML suspension Take 5 mLs (500,000 Units total) by mouth 4 (four) times daily. 60 mL 0   Omega-3 Fatty Acids (FISH OIL) 1000 MG CAPS  Take 1 capsule by mouth daily.     ondansetron  (ZOFRAN ) 4 MG tablet Take 1 tablet (4 mg total) by mouth every 8 (eight) hours as needed for nausea or vomiting. 30 tablet 2   phenytoin  (DILANTIN ) 100 MG ER capsule TAKE 2 CAPSULES BY MOUTH EVERY MORNING AND 1 CAPSULE BY MOUTH EVERY EVENING 270 capsule 3   phenytoin  (DILANTIN ) 100 MG ER capsule TAKE 2 CAPSULES BY MOUTH EVERY MORNING AND 1 CAPSULES EVERY EVENING 270 capsule 3   potassium chloride  SA (KLOR-CON  M) 20 MEQ tablet Take 1 tablet (20 mEq total) by mouth daily. 90 tablet 3   promethazine  (PHENERGAN ) 12.5 MG tablet Take 1 tablet (12.5 mg total) by mouth every 4 (four) hours as needed for nausea or vomiting. 60 tablet 3    promethazine -dextromethorphan (PROMETHAZINE -DM) 6.25-15 MG/5ML syrup TAKE 5 ML BY MOUTH FOUR TIMES DAILY AS NEEDED FOR COUGH 720 mL 3   Respiratory Therapy Supplies (FLUTTER) DEVI Use after nebulization treatments 1 each 0   sodium chloride  (MURO 128) 5 % ophthalmic solution Place 1 drop into both eyes as needed for irritation.     SYRINGE-NEEDLE, DISP, 3 ML (BD ECLIPSE SYRINGE) 25G X 1" 3 ML MISC Use sq q 2 wks 50 each 3   traZODone  (DESYREL ) 50 MG tablet TAKE 4 TABLETS(200 MG) BY MOUTH AT BEDTIME 360 tablet 3   traZODone  (DESYREL ) 50 MG tablet TAKE 4 TABLETS(200 MG) BY MOUTH AT BEDTIME 360 tablet 3   traZODone  (DESYREL ) 50 MG tablet TAKE 4 TABLETS(200 MG) BY MOUTH AT BEDTIME 360 tablet 3   verapamil  (CALAN -SR) 240 MG CR tablet TAKE 1 TABLET(240 MG) BY MOUTH AT BEDTIME 90 tablet 3   nitroGLYCERIN  (NITROSTAT ) 0.4 MG SL tablet DISSOLVE 1 TABLET UNDER THE TONGUE AS DIRECTED. MAX 3 DOSES. CALL 911 IF NEEDING ALL 3 DOSES 25 tablet 2   No facility-administered medications prior to visit.    ROS: Review of Systems  Constitutional:  Positive for chills and fatigue. Negative for activity change, appetite change and unexpected weight change.  HENT:  Positive for congestion and voice change. Negative for mouth sores, postnasal drip and sinus pressure.   Eyes:  Negative for visual disturbance.  Respiratory:  Positive for cough, shortness of breath and wheezing. Negative for chest tightness.   Gastrointestinal:  Negative for abdominal pain, nausea and vomiting.  Genitourinary:  Negative for difficulty urinating, frequency and vaginal pain.  Musculoskeletal:  Positive for arthralgias, back pain and gait problem.  Skin:  Negative for pallor and rash.  Neurological:  Negative for dizziness, tremors, weakness, numbness and headaches.  Psychiatric/Behavioral:  Negative for confusion, sleep disturbance and suicidal ideas. The patient is not nervous/anxious.     Objective:  BP 118/62   Pulse 82   Temp  98.2 F (36.8 C)   Ht 5\' 2"  (1.575 m)   Wt 151 lb 12.8 oz (68.9 kg)   SpO2 98%   BMI 27.76 kg/m   BP Readings from Last 3 Encounters:  07/14/23 118/62  07/12/23 114/74  05/12/23 124/74    Wt Readings from Last 3 Encounters:  07/14/23 151 lb 12.8 oz (68.9 kg)  07/12/23 152 lb 3.2 oz (69 kg)  05/12/23 162 lb (73.5 kg)    Physical Exam Constitutional:      General: She is not in acute distress.    Appearance: Normal appearance. She is well-developed.  HENT:     Head: Normocephalic.     Right Ear: External ear normal.  Left Ear: External ear normal.     Nose: Nose normal.  Eyes:     General:        Right eye: No discharge.        Left eye: No discharge.     Conjunctiva/sclera: Conjunctivae normal.     Pupils: Pupils are equal, round, and reactive to light.  Neck:     Thyroid : No thyromegaly.     Vascular: No JVD.     Trachea: No tracheal deviation.  Cardiovascular:     Rate and Rhythm: Normal rate and regular rhythm.     Heart sounds: Normal heart sounds.  Pulmonary:     Effort: No respiratory distress.     Breath sounds: No stridor. Rhonchi present. No wheezing.  Abdominal:     General: Bowel sounds are normal. There is no distension.     Palpations: Abdomen is soft. There is no mass.     Tenderness: There is no abdominal tenderness. There is no guarding or rebound.  Musculoskeletal:        General: No tenderness.     Cervical back: Normal range of motion and neck supple. No rigidity.  Lymphadenopathy:     Cervical: No cervical adenopathy.  Skin:    Findings: No erythema or rash.  Neurological:     Mental Status: She is oriented to person, place, and time.     Cranial Nerves: No cranial nerve deficit.     Motor: No abnormal muscle tone.     Coordination: Coordination abnormal.     Gait: Gait abnormal.     Deep Tendon Reflexes: Reflexes normal.  Psychiatric:        Behavior: Behavior normal.        Thought Content: Thought content normal.         Judgment: Judgment normal.   Using a cane   Lab Results  Component Value Date   WBC 7.1 04/14/2023   HGB 13.1 04/14/2023   HCT 39.9 04/14/2023   PLT 329.0 04/14/2023   GLUCOSE 152 (H) 07/05/2023   CHOL 142 01/25/2023   TRIG 142.0 01/25/2023   HDL 48.40 01/25/2023   LDLDIRECT 165.0 09/26/2009   LDLCALC 65 01/25/2023   ALT 20 07/05/2023   AST 25 07/05/2023   NA 139 07/05/2023   K 3.1 (L) 07/05/2023   CL 96 07/05/2023   CREATININE 0.70 07/05/2023   BUN 8 07/05/2023   CO2 30 07/05/2023   TSH 1.66 04/14/2023   INR 1.0 12/18/2021   HGBA1C 6.0 07/05/2023    MM 3D SCREENING MAMMOGRAM BILATERAL BREAST Result Date: 05/10/2023 CLINICAL DATA:  Screening. EXAM: DIGITAL SCREENING BILATERAL MAMMOGRAM WITH TOMOSYNTHESIS AND CAD TECHNIQUE: Bilateral screening digital craniocaudal and mediolateral oblique mammograms were obtained. Bilateral screening digital breast tomosynthesis was performed. The images were evaluated with computer-aided detection. COMPARISON:  Previous exam(s). ACR Breast Density Category b: There are scattered areas of fibroglandular density. FINDINGS: There are no findings suspicious for malignancy. IMPRESSION: No mammographic evidence of malignancy. A result letter of this screening mammogram will be mailed directly to the patient. RECOMMENDATION: Screening mammogram in one year. (Code:SM-B-01Y) BI-RADS CATEGORY  1: Negative. Electronically Signed   By: Alinda Apley M.D.   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    Dispense:  120 g    Refill:  2   nitroGLYCERIN  (NITROSTAT ) 0.4 MG SL tablet    Sig: DISSOLVE 1 TABLET UNDER THE TONGUE AS DIRECTED. MAX 3 DOSES. CALL 911 IF NEEDING ALL 3 DOSESDISSOLVE 1 TABLET UNDER THE TONGUE AS DIRECTED. MAX 3 DOSES. CALL 911 IF NEEDING ALL 3 DOSES    Dispense:  20 tablet    Refill:  3   potassium chloride  (KLOR-CON  M) 10 MEQ tablet    Sig: Take 1 tablet (10 mEq total) by mouth daily.    Dispense:  15 tablet    Refill:  0      Follow-up: Return in about 2 months (around 09/13/2023) for a follow-up visit.  Anitra Barn, MD

## 2023-07-14 NOTE — Assessment & Plan Note (Signed)
 On B12

## 2023-07-15 ENCOUNTER — Telehealth: Payer: Self-pay

## 2023-07-15 NOTE — Telephone Encounter (Signed)
 LVM for pt to inform that her Vraylar  is in the office and ready for pick-up.

## 2023-07-18 ENCOUNTER — Ambulatory Visit (INDEPENDENT_AMBULATORY_CARE_PROVIDER_SITE_OTHER): Payer: Medicare Other | Admitting: Psychology

## 2023-07-18 DIAGNOSIS — F331 Major depressive disorder, recurrent, moderate: Secondary | ICD-10-CM | POA: Diagnosis not present

## 2023-07-18 NOTE — Progress Notes (Signed)
 [facility] Counselor/Therapist Progress Note  Patient ID: Veronica [name], MRN: [id number],    Date: 07/18/2023  Time Spent: 12:00pm-12:45pm   45 minutes   Treatment Type: Individual Therapy  Reported Symptoms: stress  Mental Status Exam: Appearance:  Casual     Behavior: Appropriate  Motor: Normal  Speech/Language:  Normal Rate  Affect: Appropriate  Mood: normal  Thought process: normal  Thought content:   WNL  Sensory/Perceptual disturbances:   WNL  Orientation: oriented to person, place, time/date, and situation  Attention: Good  Concentration: Good  Memory: WNL  Fund of knowledge:  Good  Insight:   Good  Judgment:  Good  Impulse Control: Good   Risk Assessment: Danger to Self:  No Self-injurious Behavior: No Danger to Others: No Duty to Warn:no Physical Aggression / Violence:No  Access to Firearms a concern: No  Gang Involvement:No   Subjective: Pt present for face-to-face individual therapy via video.  Pt consents to telehealth video session and is aware of limitations and benefits of virtual sessions.  Location of pt: home Location of therapist: home office.   Pt talked about her health.  She has been very sick with upper respiratory issues.  She has had several doctors appointments and is on several medications and has improved a little bit.   Pt has been putting pressure on herself for not getting things done.  Encouraged pt to prioritize healing and resting and taking care of herself.  Worked on self care strategies.  Provided supportive therapy.  Interventions: Cognitive Behavioral Therapy and Insight-Oriented  Diagnosis: F33.1   Plan of Care: Recommend ongoing therapy.   Pt participated in setting therapy goals.  Pt wants to have someone to talk to and improve coping skills.   Plan to continue to meet every two weeks.  Pt agrees with treatment plan.  Treatment Plan (Treatment Plan Target Date: 07/03/2024) Client Abilities/Strengths  Pt  is bright, engaging, and motivated for therapy.   Client Treatment Preferences  Individual therapy.  Client Statement of Needs  Improve coping skills.  Symptoms  Depressed or irritable mood. Lack of energy. Low self-esteem. Unresolved grief issues.   Problems Addressed  Unipolar Depression Goals 1. Alleviate depressive symptoms and return to previous level of effective functioning. 2. Appropriately grieve the loss in order to normalize mood and to return to previously adaptive level of functioning. Objective Learn and implement behavioral strategies to overcome depression. Target Date: 2024-07-03 Frequency: Biweekly  Progress: 55 Modality: individual  Related Interventions Engage the client in "behavioral activation," increasing his/her activity level and contact with sources of reward, while identifying processes that inhibit activation.  Use behavioral techniques such as instruction, rehearsal, role-playing, role reversal, as needed, to facilitate activity in the client's daily life; reinforce success. Assist the client in developing skills that increase the likelihood of deriving pleasure from behavioral activation (e.g., assertiveness skills, developing an exercise plan, less internal/more external focus, increased social involvement); reinforce success. Objective Identify important people in life, past and present, and describe the quality, good and poor, of those relationships. Target Date: 2024-07-03 Frequency: Biweekly  Progress: 55 Modality: individual  Related Interventions Conduct Interpersonal Therapy beginning with the assessment of the client's "interpersonal inventory" of important past and present relationships; develop a case formulation linking depression to grief, interpersonal role disputes, role transitions, and/or interpersonal deficits). Objective Learn and implement problem-solving and decision-making skills. Target Date: 2024-07-03 Frequency: Biweekly   Progress: 55 Modality: individual  Related Interventions Conduct Problem-Solving Therapy using techniques such as  psychoeducation, modeling, and role-playing to teach client problem-solving skills (i.e., defining a problem specifically, generating possible solutions, evaluating the pros and cons of each solution, selecting and implementing a plan of action, evaluating the efficacy of the plan, accepting or revising the plan); role-play application of the problem-solving skill to a real life issue. Encourage in the client the development of a positive problem orientation in which problems and solving them are viewed as a natural part of life and not something to be feared, despaired, or avoided. 3. Develop healthy interpersonal relationships that lead to the alleviation and help prevent the relapse of depression. 4. Develop healthy thinking patterns and beliefs about self, others, and the world that lead to the alleviation and help prevent the relapse of depression. 5. Recognize, accept, and cope with feelings of depression. Diagnosis F33.1  Conditions For Discharge Achievement of treatment goals and objectives   Willey Harrier, LCSW

## 2023-08-01 ENCOUNTER — Ambulatory Visit: Payer: Medicare Other | Admitting: Psychology

## 2023-08-01 ENCOUNTER — Other Ambulatory Visit: Payer: Self-pay | Admitting: Family

## 2023-08-01 DIAGNOSIS — F331 Major depressive disorder, recurrent, moderate: Secondary | ICD-10-CM | POA: Diagnosis not present

## 2023-08-01 NOTE — Progress Notes (Signed)
 [facility] Counselor/Therapist Progress Note  Patient ID: Veronica [name], MRN: [id number],    Date: 08/01/2023  Time Spent: 12:00pm-12:45pm   45 minutes   Treatment Type: Individual Therapy  Reported Symptoms: stress, feeling down  Mental Status Exam: Appearance:  Casual     Behavior: Appropriate  Motor: Normal  Speech/Language:  Normal Rate  Affect: Appropriate  Mood: normal  Thought process: normal  Thought content:   WNL  Sensory/Perceptual disturbances:   WNL  Orientation: oriented to person, place, time/date, and situation  Attention: Good  Concentration: Good  Memory: WNL  Fund of knowledge:  Good  Insight:   Good  Judgment:  Good  Impulse Control: Good   Risk Assessment: Danger to Self:  No Self-injurious Behavior: No Danger to Others: No Duty to Warn:no Physical Aggression / Violence:No  Access to Firearms a concern: No  Gang Involvement:No   Subjective: Pt present for face-to-face individual therapy via video.  Pt consents to telehealth video session and is aware of limitations and benefits of virtual sessions.  Location of pt: home Location of therapist: home office.   Pt talked about her health.  She is getting better but still has a lot of respiratory issues.  She is using her inhaler but it gives her the shakes and makes her nervous.  Pt sees her doctor again in a few weeks.  Pt is still not feeling well enough to do much. Pt is not sleeping well bc of the steroids she is on for her illness.   Pt feels down that it is taking so long to heal.   Worked on coping strategies.   Pt has been putting pressure on herself for not getting things done.  Encouraged pt to prioritize healing and resting and taking care of herself.  Worked on self care strategies.  Provided supportive therapy.  Interventions: Cognitive Behavioral Therapy and Insight-Oriented  Diagnosis: F33.1   Plan of Care: Recommend ongoing therapy.   Pt participated in setting  therapy goals.  Pt wants to have someone to talk to and improve coping skills.   Plan to continue to meet every two weeks.  Pt agrees with treatment plan.  Treatment Plan (Treatment Plan Target Date: 07/03/2024) Client Abilities/Strengths  Pt is bright, engaging, and motivated for therapy.   Client Treatment Preferences  Individual therapy.  Client Statement of Needs  Improve coping skills.  Symptoms  Depressed or irritable mood. Lack of energy. Low self-esteem. Unresolved grief issues.   Problems Addressed  Unipolar Depression Goals 1. Alleviate depressive symptoms and return to previous level of effective functioning. 2. Appropriately grieve the loss in order to normalize mood and to return to previously adaptive level of functioning. Objective Learn and implement behavioral strategies to overcome depression. Target Date: 2024-07-03 Frequency: Biweekly  Progress: 55 Modality: individual  Related Interventions Engage the client in "behavioral activation," increasing his/her activity level and contact with sources of reward, while identifying processes that inhibit activation.  Use behavioral techniques such as instruction, rehearsal, role-playing, role reversal, as needed, to facilitate activity in the client's daily life; reinforce success. Assist the client in developing skills that increase the likelihood of deriving pleasure from behavioral activation (e.g., assertiveness skills, developing an exercise plan, less internal/more external focus, increased social involvement); reinforce success. Objective Identify important people in life, past and present, and describe the quality, good and poor, of those relationships. Target Date: 2024-07-03 Frequency: Biweekly  Progress: 55 Modality: individual  Related Interventions Conduct Interpersonal Therapy beginning with  the assessment of the client's "interpersonal inventory" of important past and present relationships; develop a case  formulation linking depression to grief, interpersonal role disputes, role transitions, and/or interpersonal deficits). Objective Learn and implement problem-solving and decision-making skills. Target Date: 2024-07-03 Frequency: Biweekly  Progress: 55 Modality: individual  Related Interventions Conduct Problem-Solving Therapy using techniques such as psychoeducation, modeling, and role-playing to teach client problem-solving skills (i.e., defining a problem specifically, generating possible solutions, evaluating the pros and cons of each solution, selecting and implementing a plan of action, evaluating the efficacy of the plan, accepting or revising the plan); role-play application of the problem-solving skill to a real life issue. Encourage in the client the development of a positive problem orientation in which problems and solving them are viewed as a natural part of life and not something to be feared, despaired, or avoided. 3. Develop healthy interpersonal relationships that lead to the alleviation and help prevent the relapse of depression. 4. Develop healthy thinking patterns and beliefs about self, others, and the world that lead to the alleviation and help prevent the relapse of depression. 5. Recognize, accept, and cope with feelings of depression. Diagnosis F33.1  Conditions For Discharge Achievement of treatment goals and objectives   Willey Harrier, LCSW

## 2023-08-10 ENCOUNTER — Encounter: Payer: Self-pay | Admitting: Internal Medicine

## 2023-08-10 ENCOUNTER — Other Ambulatory Visit: Payer: Self-pay | Admitting: Internal Medicine

## 2023-08-10 NOTE — Telephone Encounter (Signed)
 Copied from CRM [id number]. Topic: Clinical - Medication Refill >> Aug 10, 2023  2:23 PM Jenice Mitts wrote: Medication: acetaminophen -codeine  (TYLENOL  #3) 300-30 MG tablet  Has the patient contacted their pharmacy? Yes (Agent: If no, request that the patient contact the pharmacy for the refill. If patient does not wish to contact the pharmacy document the reason why and proceed with request.) (Agent: If yes, when and what did the pharmacy advise?)  This is the patient's preferred pharmacy:  [id number] - [place], [place] - [address] AT [facility] [address] [place] [address] Phone: [phone] Fax: [phone]    Is this the correct pharmacy for this prescription? Yes If no, delete pharmacy and type the correct one.   Has the prescription been filled recently? No  Is the patient out of the medication? yes  Has the patient been seen for an appointment in the last year OR does the patient have an upcoming appointment? Yes  Can we respond through MyChart? Yes  Agent: Please be advised that Rx refills may take up to 3 business days. We ask that you follow-up with your pharmacy.

## 2023-08-10 NOTE — Telephone Encounter (Signed)
 Copied from CRM [id number]. Topic: Clinical - Medication Refill >> Aug 10, 2023  3:14 PM Leah C wrote: Medication: acetaminophen -codeine  (TYLENOL  #3) 300-30 MG tablet  Has the patient contacted their pharmacy? Yes, patient stated [facility] did not receive request.  (Agent: If no, request that the patient contact the pharmacy for the refill. If patient does not wish to contact the pharmacy document the reason why and proceed with request.) (Agent: If yes, when and what did the pharmacy advise?)  This is the patient's preferred pharmacy:  [id number] - [place], [place] - [address] AT [facility] OF [address] [place] [address] Phone: [phone] Fax: [phone]    Is this the correct pharmacy for this prescription? Yes If no, delete pharmacy and type the correct one.   Has the prescription been filled recently? Yes  Is the patient out of the medication? Yes  Has the patient been seen for an appointment in the last year OR does the patient have an upcoming appointment? Yes  Can we respond through MyChart? Yes  Agent: Please be advised that Rx refills may take up to 3 business days. We ask that you follow-up with your pharmacy.

## 2023-08-11 ENCOUNTER — Other Ambulatory Visit: Payer: Self-pay | Admitting: Internal Medicine

## 2023-08-11 NOTE — Telephone Encounter (Signed)
 Med already pended for reorderThis encounter was created in error - please disregard.

## 2023-08-13 ENCOUNTER — Other Ambulatory Visit: Payer: Self-pay | Admitting: Internal Medicine

## 2023-08-15 ENCOUNTER — Ambulatory Visit (INDEPENDENT_AMBULATORY_CARE_PROVIDER_SITE_OTHER): Payer: Medicare Other | Admitting: Psychology

## 2023-08-15 DIAGNOSIS — F331 Major depressive disorder, recurrent, moderate: Secondary | ICD-10-CM | POA: Diagnosis not present

## 2023-08-15 NOTE — Progress Notes (Signed)
 [facility] Counselor/Therapist Progress Note  Patient ID: GORGEOUS NEWLUN, MRN: [id number],    Date: 08/15/2023  Time Spent: 12:00pm-12:45pm   45 minutes   Treatment Type: Individual Therapy  Reported Symptoms: stress, feeling down  Mental Status Exam: Appearance:  Casual     Behavior: Appropriate  Motor: Normal  Speech/Language:  Normal Rate  Affect: Appropriate  Mood: normal  Thought process: normal  Thought content:   WNL  Sensory/Perceptual disturbances:   WNL  Orientation: oriented to person, place, time/date, and situation  Attention: Good  Concentration: Good  Memory: WNL  Fund of knowledge:  Good  Insight:   Good  Judgment:  Good  Impulse Control: Good   Risk Assessment: Danger to Self:  No Self-injurious Behavior: No Danger to Others: No Duty to Warn:no Physical Aggression / Violence:No  Access to Firearms a concern: No  Gang Involvement:No   Subjective: Pt present for face-to-face individual therapy via video.  Pt consents to telehealth video session and is aware of limitations and benefits of virtual sessions.  Location of pt: home Location of therapist: home office.   Pt talked about her health.  She is feeling better but is feeling weak. Pt's balance has been off.   She fell in a store but did not hurt herself too badly.   Pt states she has still been feeling down about her health.  Worked on coping strategies.   Pt has been putting pressure on herself for not getting things done.  Encouraged pt to prioritize healing and resting and taking care of herself.  Worked on self care strategies.  Provided supportive therapy.  Interventions: Cognitive Behavioral Therapy and Insight-Oriented  Diagnosis: F33.1   Plan of Care: Recommend ongoing therapy.   Pt participated in setting therapy goals.  Pt wants to have someone to talk to and improve coping skills.   Plan to continue to meet every two weeks.  Pt agrees with treatment plan.  Treatment  Plan (Treatment Plan Target Date: 07/03/2024) Client Abilities/Strengths  Pt is bright, engaging, and motivated for therapy.   Client Treatment Preferences  Individual therapy.  Client Statement of Needs  Improve coping skills.  Symptoms  Depressed or irritable mood. Lack of energy. Low self-esteem. Unresolved grief issues.   Problems Addressed  Unipolar Depression Goals 1. Alleviate depressive symptoms and return to previous level of effective functioning. 2. Appropriately grieve the loss in order to normalize mood and to return to previously adaptive level of functioning. Objective Learn and implement behavioral strategies to overcome depression. Target Date: 2024-07-03 Frequency: Biweekly  Progress: 55 Modality: individual  Related Interventions Engage the client in "behavioral activation," increasing his/her activity level and contact with sources of reward, while identifying processes that inhibit activation.  Use behavioral techniques such as instruction, rehearsal, role-playing, role reversal, as needed, to facilitate activity in the client's daily life; reinforce success. Assist the client in developing skills that increase the likelihood of deriving pleasure from behavioral activation (e.g., assertiveness skills, developing an exercise plan, less internal/more external focus, increased social involvement); reinforce success. Objective Identify important people in life, past and present, and describe the quality, good and poor, of those relationships. Target Date: 2024-07-03 Frequency: Biweekly  Progress: 55 Modality: individual  Related Interventions Conduct Interpersonal Therapy beginning with the assessment of the client's "interpersonal inventory" of important past and present relationships; develop a case formulation linking depression to grief, interpersonal role disputes, role transitions, and/or interpersonal deficits). Objective Learn and implement problem-solving and  decision-making skills.  Target Date: 2024-07-03 Frequency: Biweekly  Progress: 55 Modality: individual  Related Interventions Conduct Problem-Solving Therapy using techniques such as psychoeducation, modeling, and role-playing to teach client problem-solving skills (i.e., defining a problem specifically, generating possible solutions, evaluating the pros and cons of each solution, selecting and implementing a plan of action, evaluating the efficacy of the plan, accepting or revising the plan); role-play application of the problem-solving skill to a real life issue. Encourage in the client the development of a positive problem orientation in which problems and solving them are viewed as a natural part of life and not something to be feared, despaired, or avoided. 3. Develop healthy interpersonal relationships that lead to the alleviation and help prevent the relapse of depression. 4. Develop healthy thinking patterns and beliefs about self, others, and the world that lead to the alleviation and help prevent the relapse of depression. 5. Recognize, accept, and cope with feelings of depression. Diagnosis F33.1  Conditions For Discharge Achievement of treatment goals and objectives   Willey Harrier, LCSW

## 2023-08-29 ENCOUNTER — Ambulatory Visit (INDEPENDENT_AMBULATORY_CARE_PROVIDER_SITE_OTHER): Payer: Medicare Other | Admitting: Psychology

## 2023-08-29 DIAGNOSIS — F331 Major depressive disorder, recurrent, moderate: Secondary | ICD-10-CM

## 2023-08-29 NOTE — Progress Notes (Signed)
 [facility] Counselor/Therapist Progress Note  Patient ID: NAVREET BOLDA, MRN: [id number],    Date: 08/29/2023  Time Spent: 12:00pm-12:45pm   45 minutes   Treatment Type: Individual Therapy  Reported Symptoms: stress, feeling down  Mental Status Exam: Appearance:  Casual     Behavior: Appropriate  Motor: Normal  Speech/Language:  Normal Rate  Affect: Appropriate  Mood: normal  Thought process: normal  Thought content:   WNL  Sensory/Perceptual disturbances:   WNL  Orientation: oriented to person, place, time/date, and situation  Attention: Good  Concentration: Good  Memory: WNL  Fund of knowledge:  Good  Insight:   Good  Judgment:  Good  Impulse Control: Good   Risk Assessment: Danger to Self:  No Self-injurious Behavior: No Danger to Others: No Duty to Warn:no Physical Aggression / Violence:No  Access to Firearms a concern: No  Gang Involvement:No   Subjective: Pt present for face-to-face individual therapy via video.  Pt consents to telehealth video session and is aware of limitations and benefits of virtual sessions.  Location of pt: home Location of therapist: home office.   Pt talked about her health.  Pt has several doctors appointments coming up.  She also is scheduled to get injections for her migraines.  Pt states she has still been feeling down about her health.  Worked on coping strategies.   Pt has been worrying about a money situation.  Addressed the situation and helped pt with problem solving.  Worked on worry management.  Pt has been putting pressure on herself for not getting things done.  Pt has been organizing her medications and paper work.  Encouraged pt to prioritize pacing herself and resting and taking care of herself.  Worked on self care strategies.  Provided supportive therapy.  Interventions: Cognitive Behavioral Therapy and Insight-Oriented  Diagnosis: F33.1   Plan of Care: Recommend ongoing therapy.   Pt  participated in setting therapy goals.  Pt wants to have someone to talk to and improve coping skills.   Plan to continue to meet every two weeks.  Pt agrees with treatment plan.  Treatment Plan (Treatment Plan Target Date: 07/03/2024) Client Abilities/Strengths  Pt is bright, engaging, and motivated for therapy.   Client Treatment Preferences  Individual therapy.  Client Statement of Needs  Improve coping skills.  Symptoms  Depressed or irritable mood. Lack of energy. Low self-esteem. Unresolved grief issues.   Problems Addressed  Unipolar Depression Goals 1. Alleviate depressive symptoms and return to previous level of effective functioning. 2. Appropriately grieve the loss in order to normalize mood and to return to previously adaptive level of functioning. Objective Learn and implement behavioral strategies to overcome depression. Target Date: 2024-07-03 Frequency: Biweekly  Progress: 55 Modality: individual  Related Interventions Engage the client in behavioral activation, increasing his/her activity level and contact with sources of reward, while identifying processes that inhibit activation.  Use behavioral techniques such as instruction, rehearsal, role-playing, role reversal, as needed, to facilitate activity in the client's daily life; reinforce success. Assist the client in developing skills that increase the likelihood of deriving pleasure from behavioral activation (e.g., assertiveness skills, developing an exercise plan, less internal/more external focus, increased social involvement); reinforce success. Objective Identify important people in life, past and present, and describe the quality, good and poor, of those relationships. Target Date: 2024-07-03 Frequency: Biweekly  Progress: 55 Modality: individual  Related Interventions Conduct Interpersonal Therapy beginning with the assessment of the client's interpersonal inventory of important past and present  relationships; develop a case formulation linking depression to grief, interpersonal role disputes, role transitions, and/or interpersonal deficits). Objective Learn and implement problem-solving and decision-making skills. Target Date: 2024-07-03 Frequency: Biweekly  Progress: 55 Modality: individual  Related Interventions Conduct Problem-Solving Therapy using techniques such as psychoeducation, modeling, and role-playing to teach client problem-solving skills (i.e., defining a problem specifically, generating possible solutions, evaluating the pros and cons of each solution, selecting and implementing a plan of action, evaluating the efficacy of the plan, accepting or revising the plan); role-play application of the problem-solving skill to a real life issue. Encourage in the client the development of a positive problem orientation in which problems and solving them are viewed as a natural part of life and not something to be feared, despaired, or avoided. 3. Develop healthy interpersonal relationships that lead to the alleviation and help prevent the relapse of depression. 4. Develop healthy thinking patterns and beliefs about self, others, and the world that lead to the alleviation and help prevent the relapse of depression. 5. Recognize, accept, and cope with feelings of depression. Diagnosis F33.1  Conditions For Discharge Achievement of treatment goals and objectives   Willey Harrier, LCSW

## 2023-08-31 ENCOUNTER — Other Ambulatory Visit: Payer: Self-pay | Admitting: Pulmonary Disease

## 2023-09-01 ENCOUNTER — Telehealth: Payer: Self-pay

## 2023-09-01 NOTE — Telephone Encounter (Signed)
 Not sure what the reason is? Can we engage the PA team? Thanks!

## 2023-09-01 NOTE — Telephone Encounter (Signed)
 Copied from CRM [id number]. Topic: Clinical - Prescription Issue >> Aug 31, 2023  3:19 PM Veronica [name] wrote: Reason for CRM: Please call patient regarding prescription and [facility].  I called and spoke to pt. Pt states she has not used her Yupelri  but would like to try it again. Pt states the pharmacy called and stated that Veronica [name] wanted to speak to her regarding the RX. I informed pt that per the LOV, Veronica [name] d/c the Yupelri  there for the pharmacy can not refill a medication that is not authorized by a provider. I informed pt that she has an upcoming appointment with Veronica [name] where the pt and the provider can address the medications in person. I advised pt to bring in her medications to the next appointment. Pt verbalized understanding. NFN

## 2023-09-01 NOTE — Telephone Encounter (Signed)
 Copied from CRM [id number]. Topic: Clinical - Prescription Issue >> Aug 31, 2023 12:54 PM Margarette Shawl wrote: Reason for CRM:   Pt is contacting clinic regarding the denial of her refill request for Brovana . She was advised by the pharmacist to contact her provider or nurse to discuss the reason.   CB#  [phone]

## 2023-09-02 NOTE — Telephone Encounter (Signed)
 Spoke with patient regarding prior message . Patient stated she does have a f/u with Dr.Hunsucker on 10/04/2023 and will discuss the medication with him at the time of visit.Nothing else further needed.

## 2023-09-02 NOTE — Telephone Encounter (Signed)
 Previous note states a denial of her refill request. Do they mean an insurance denial of the medication? Or the office denied giving her refills?

## 2023-09-07 ENCOUNTER — Other Ambulatory Visit (INDEPENDENT_AMBULATORY_CARE_PROVIDER_SITE_OTHER)

## 2023-09-07 DIAGNOSIS — E785 Hyperlipidemia, unspecified: Secondary | ICD-10-CM

## 2023-09-07 DIAGNOSIS — E876 Hypokalemia: Secondary | ICD-10-CM

## 2023-09-07 DIAGNOSIS — J209 Acute bronchitis, unspecified: Secondary | ICD-10-CM | POA: Diagnosis not present

## 2023-09-07 LAB — COMPREHENSIVE METABOLIC PANEL WITH GFR
ALT: 17 U/L (ref 0–35)
AST: 28 U/L (ref 0–37)
Albumin: 3.9 g/dL (ref 3.5–5.2)
Alkaline Phosphatase: 133 U/L — ABNORMAL HIGH (ref 39–117)
BUN: 9 mg/dL (ref 6–23)
CO2: 29 meq/L (ref 19–32)
Calcium: 8.1 mg/dL — ABNORMAL LOW (ref 8.4–10.5)
Chloride: 92 meq/L — ABNORMAL LOW (ref 96–112)
Creatinine, Ser: 0.82 mg/dL (ref 0.40–1.20)
GFR: 70.45 mL/min (ref >60.00)
Glucose, Bld: 207 mg/dL — ABNORMAL HIGH (ref 70–99)
Potassium: 3.1 meq/L — ABNORMAL LOW (ref 3.5–5.1)
Sodium: 134 meq/L — ABNORMAL LOW (ref 135–145)
Total Bilirubin: 0.2 mg/dL (ref 0.2–1.2)
Total Protein: 6.9 g/dL (ref 6.0–8.3)

## 2023-09-07 LAB — T4, FREE: Free T4: 0.84 ng/dL (ref 0.60–1.60)

## 2023-09-07 LAB — TSH: TSH: 0.8 u[IU]/mL (ref 0.35–5.50)

## 2023-09-08 ENCOUNTER — Encounter: Payer: Self-pay | Admitting: Internal Medicine

## 2023-09-08 ENCOUNTER — Ambulatory Visit: Payer: Self-pay | Admitting: Internal Medicine

## 2023-09-08 ENCOUNTER — Ambulatory Visit: Admitting: Internal Medicine

## 2023-09-08 VITALS — BP 102/76 | HR 74 | Temp 98.2°F | Ht 62.0 in | Wt 147.0 lb

## 2023-09-08 DIAGNOSIS — G8929 Other chronic pain: Secondary | ICD-10-CM

## 2023-09-08 DIAGNOSIS — M545 Low back pain, unspecified: Secondary | ICD-10-CM

## 2023-09-08 DIAGNOSIS — F329 Major depressive disorder, single episode, unspecified: Secondary | ICD-10-CM

## 2023-09-08 DIAGNOSIS — L509 Urticaria, unspecified: Secondary | ICD-10-CM

## 2023-09-08 DIAGNOSIS — E538 Deficiency of other specified B group vitamins: Secondary | ICD-10-CM | POA: Diagnosis not present

## 2023-09-08 DIAGNOSIS — J4489 Other specified chronic obstructive pulmonary disease: Secondary | ICD-10-CM

## 2023-09-08 DIAGNOSIS — E559 Vitamin D deficiency, unspecified: Secondary | ICD-10-CM

## 2023-09-08 DIAGNOSIS — E876 Hypokalemia: Secondary | ICD-10-CM | POA: Diagnosis not present

## 2023-09-08 MED ORDER — CYANOCOBALAMIN 1000 MCG/ML IJ SOLN: INTRAMUSCULAR | 5 refills | Status: AC

## 2023-09-08 MED ORDER — BD INSULIN SYRINGE 25G X 1" 1 ML MISC: 2 refills | Status: AC

## 2023-09-08 MED ORDER — PROMETHAZINE-DM 6.25-15 MG/5ML PO SYRP: ORAL_SOLUTION | ORAL | 3 refills | Status: DC

## 2023-09-08 MED ORDER — CLONAZEPAM 1 MG PO TABS: ORAL_TABLET | ORAL | 1 refills | Status: AC

## 2023-09-08 MED ORDER — ATORVASTATIN CALCIUM 40 MG PO TABS: 40.0000 mg | ORAL_TABLET | Freq: Every day | ORAL | 1 refills | Status: DC

## 2023-09-08 MED ORDER — POTASSIUM CHLORIDE CRYS ER 20 MEQ PO TBCR: 20.0000 meq | EXTENDED_RELEASE_TABLET | Freq: Two times a day (BID) | ORAL | 3 refills | Status: AC

## 2023-09-08 MED ORDER — ESCITALOPRAM OXALATE 20 MG PO TABS: ORAL_TABLET | ORAL | 3 refills | Status: AC

## 2023-09-08 NOTE — Assessment & Plan Note (Signed)
Chronic  Cont on Tylenol #3 prn  Potential benefits of a long term opioids use as well as potential risks (i.e. addiction risk, apnea etc) and complications (i.e. Somnolence, constipation and others) were explained to the patient and were aknowledged. Flexeril

## 2023-09-08 NOTE — Progress Notes (Signed)
 Subjective:  Patient ID: Veronica [name], female    DOB: 05/24/1950  Age: 73 y.o. MRN: [id number]  CC: Medical Management of Chronic Issues (2 mnth f/u)   HPI Veronica [name] presents for hand cramps, low K, COPD/cough  Outpatient Medications Prior to Visit  Medication Sig Dispense Refill   acetaminophen -codeine  (TYLENOL  #3) 300-30 MG tablet TAKE 1 TABLET BY MOUTH EVERY 6 HOURS AS NEEDED 60 tablet 1   albuterol  (PROVENTIL ) (2.5 MG/3ML) 0.083% nebulizer solution USE 1 VIAL IN NEBULIZER 2 TIMES DAILY. Generic: VENTOLIN  180 mL 10   albuterol  (VENTOLIN  HFA) 108 (90 Base) MCG/ACT inhaler INHALE 2 PUFFS INTO THE LUNGS EVERY 4 HOURS AS NEEDED FOR WHEEZING OR SHORTNESS OF BREATH 6.7 g 5   aspirin 81 MG chewable tablet Chew by mouth daily.     budesonide  (PULMICORT ) 0.5 MG/2ML nebulizer solution USE 1 VIAL IN NEBULIZER IN THE MORNING AND AT BEDTIME. Generic: Pulmicort  120 mL 11   calcium  carbonate (OSCAL) 1500 (600 Ca) MG TABS tablet Take 600 mg of elemental calcium  by mouth 2 (two) times daily with a meal.      cariprazine  (VRAYLAR ) 1.5 MG capsule Take 1 capsule (1.5 mg total) by mouth daily. [facility] @ [phone] Lot # [id number] Exp- [id number] 90 capsule 3   Cholecalciferol (VITAMIN D3) 50 MCG (2000 UT) capsule Take 1 capsule (2,000 Units total) by mouth daily. 100 capsule 3   diphenoxylate -atropine  (LOMOTIL ) 2.5-0.025 MG tablet Take 1 tablet by mouth 4 (four) times daily as needed for diarrhea or loose stools. 60 tablet 2   Docusate Calcium  (STOOL SOFTENER PO) Take 100 mg by mouth as needed (for constipation).     ezetimibe  (ZETIA ) 10 MG tablet Take 1 tablet (10 mg total) by mouth daily. 90 tablet 3   furosemide  (LASIX ) 40 MG tablet Take 1 tablet (40 mg total) by mouth daily. 90 tablet 3   isosorbide  mononitrate (IMDUR ) 60 MG 24 hr tablet Take 1 tablet (60 mg total) by mouth daily. 90 tablet 3   ketoconazole  (NIZORAL ) 2 % cream Apply 1 Application topically 2 (two) times daily. 120 g 2    levothyroxine  (SYNTHROID ) 88 MCG tablet TAKE 1 TABLET(88 MCG) BY MOUTH DAILY 90 tablet 3   metFORMIN  (GLUCOPHAGE ) 500 MG tablet Take 1 tablet (500 mg total) by mouth daily with breakfast. 90 tablet 3   metoprolol  tartrate (LOPRESSOR ) 50 MG tablet TAKE 1 TABLET 2 HOURS PRIOR TO TEST 1 tablet 0   Multiple Vitamins-Minerals (PRESERVISION AREDS 2 PO) Take by mouth.     nitroGLYCERIN  (NITROSTAT ) 0.4 MG SL tablet DISSOLVE 1 TABLET UNDER THE TONGUE AS DIRECTED. MAX 3 DOSES. CALL 911 IF NEEDING ALL 3 DOSESDISSOLVE 1 TABLET UNDER THE TONGUE AS DIRECTED. MAX 3 DOSES. CALL 911 IF NEEDING ALL 3 DOSES 20 tablet 3   nystatin  (MYCOSTATIN ) 100000 UNIT/ML suspension Take 5 mLs (500,000 Units total) by mouth 4 (four) times daily. 60 mL 0   Omega-3 Fatty Acids (FISH OIL) 1000 MG CAPS Take 1 capsule by mouth daily.     ondansetron  (ZOFRAN ) 4 MG tablet Take 1 tablet (4 mg total) by mouth every 8 (eight) hours as needed for nausea or vomiting. 30 tablet 2   phenytoin  (DILANTIN ) 100 MG ER capsule TAKE 2 CAPSULES BY MOUTH EVERY MORNING AND 1 CAPSULE BY MOUTH EVERY EVENING 270 capsule 3   phenytoin  (DILANTIN ) 100 MG ER capsule TAKE 2 CAPSULES BY MOUTH EVERY MORNING AND 1 CAPSULES EVERY EVENING 270 capsule 3  promethazine  (PHENERGAN ) 12.5 MG tablet Take 1 tablet (12.5 mg total) by mouth every 4 (four) hours as needed for nausea or vomiting. 60 tablet 3   Respiratory Therapy Supplies (FLUTTER) DEVI Use after nebulization treatments 1 each 0   sodium chloride  (MURO 128) 5 % ophthalmic solution Place 1 drop into both eyes as needed for irritation.     SYRINGE-NEEDLE, DISP, 3 ML (BD ECLIPSE SYRINGE) 25G X 1 3 ML MISC Use sq q 2 wks 50 each 3   traZODone  (DESYREL ) 50 MG tablet TAKE 4 TABLETS(200 MG) BY MOUTH AT BEDTIME 360 tablet 3   traZODone  (DESYREL ) 50 MG tablet TAKE 4 TABLETS(200 MG) BY MOUTH AT BEDTIME 360 tablet 3   traZODone  (DESYREL ) 50 MG tablet TAKE 4 TABLETS(200 MG) BY MOUTH AT BEDTIME 360 tablet 3   verapamil   (CALAN -SR) 240 MG CR tablet TAKE 1 TABLET(240 MG) BY MOUTH AT BEDTIME 90 tablet 3   atorvastatin  (LIPITOR) 40 MG tablet TAKE 1 TABLET BY MOUTH EVERY DAY 90 tablet 1   clonazePAM  (KLONOPIN ) 1 MG tablet TAKE 2 TABLETS BY MOUTH EVERY NIGHT AT BEDTIME AND 1/2 TABLET EXTRA IN DAYTIME AS NEEDED 225 tablet 1   cyanocobalamin  (VITAMIN B12) 1000 MCG/ML injection INJECT 1 ML IN THE MUSCLE EVERY 14 DAYS 10 mL 5   escitalopram  (LEXAPRO ) 20 MG tablet TAKE 1 TABLET(20 MG) BY MOUTH DAILY 90 tablet 3   Insulin  Syringe-Needle U-100 (B-D INSULIN  SYRINGE 1CC/25GX1) 25G X 1 1 ML MISC USE AS DIRECTED EVERY 2 WEEKS 50 each 2   potassium chloride  (KLOR-CON  M) 10 MEQ tablet Take 1 tablet (10 mEq total) by mouth daily. 15 tablet 0   potassium chloride  SA (KLOR-CON  M) 20 MEQ tablet Take 1 tablet (20 mEq total) by mouth daily. 90 tablet 3   promethazine -dextromethorphan (PROMETHAZINE -DM) 6.25-15 MG/5ML syrup TAKE 5 ML BY MOUTH FOUR TIMES DAILY AS NEEDED FOR COUGH 720 mL 3   No facility-administered medications prior to visit.    ROS: Review of Systems  Constitutional:  Positive for fatigue. Negative for activity change, appetite change, chills and unexpected weight change.  HENT:  Negative for congestion, mouth sores and sinus pressure.   Eyes:  Negative for visual disturbance.  Respiratory:  Positive for cough, shortness of breath and wheezing. Negative for chest tightness.   Gastrointestinal:  Negative for abdominal pain and nausea.  Genitourinary:  Negative for difficulty urinating, frequency and vaginal pain.  Musculoskeletal:  Positive for arthralgias, back pain, gait problem, myalgias and neck stiffness.  Skin:  Negative for pallor and rash.  Neurological:  Positive for weakness. Negative for dizziness, tremors, numbness and headaches.  Psychiatric/Behavioral:  Positive for dysphoric mood and sleep disturbance. Negative for confusion and suicidal ideas. The patient is nervous/anxious.     Objective:  BP  102/76   Pulse 74   Temp 98.2 F (36.8 C) (Oral)   Ht 5' 2 (1.575 m)   Wt 147 lb (66.7 kg)   SpO2 92%   BMI 26.89 kg/m   BP Readings from Last 3 Encounters:  09/08/23 102/76  07/14/23 118/62  07/12/23 114/74    Wt Readings from Last 3 Encounters:  09/08/23 147 lb (66.7 kg)  07/14/23 151 lb 12.8 oz (68.9 kg)  07/12/23 152 lb 3.2 oz (69 kg)    Physical Exam Constitutional:      General: She is not in acute distress.    Appearance: She is well-developed. She is not toxic-appearing.  HENT:     Head: Normocephalic.  Right Ear: External ear normal.     Left Ear: External ear normal.     Nose: Nose normal.   Eyes:     General:        Right eye: No discharge.        Left eye: No discharge.     Conjunctiva/sclera: Conjunctivae normal.     Pupils: Pupils are equal, round, and reactive to light.   Neck:     Thyroid : No thyromegaly.     Vascular: No JVD.     Trachea: No tracheal deviation.   Cardiovascular:     Rate and Rhythm: Normal rate and regular rhythm.     Heart sounds: Normal heart sounds.  Pulmonary:     Effort: No respiratory distress.     Breath sounds: No stridor. No wheezing.  Abdominal:     General: Bowel sounds are normal. There is no distension.     Palpations: Abdomen is soft. There is no mass.     Tenderness: There is no abdominal tenderness. There is no guarding or rebound.   Musculoskeletal:        General: No tenderness.     Cervical back: Normal range of motion and neck supple. No rigidity.     Right lower leg: No edema.     Left lower leg: No edema.  Lymphadenopathy:     Cervical: No cervical adenopathy.   Skin:    Findings: No erythema or rash.   Neurological:     Mental Status: She is oriented to person, place, and time.     Cranial Nerves: No cranial nerve deficit.     Motor: No abnormal muscle tone.     Coordination: Coordination normal.     Gait: Gait abnormal.     Deep Tendon Reflexes: Reflexes normal.   Psychiatric:         Behavior: Behavior normal.        Thought Content: Thought content normal.        Judgment: Judgment normal.    Very long fingernails were trimmed on both hands.  Lab Results  Component Value Date   WBC 7.1 04/14/2023   HGB 13.1 04/14/2023   HCT 39.9 04/14/2023   PLT 329.0 04/14/2023   GLUCOSE 207 (H) 09/07/2023   CHOL 142 01/25/2023   TRIG 142.0 01/25/2023   HDL 48.40 01/25/2023   LDLDIRECT 165.0 09/26/2009   LDLCALC 65 01/25/2023   ALT 17 09/07/2023   AST 28 09/07/2023   NA 134 (L) 09/07/2023   K 3.1 (L) 09/07/2023   CL 92 (L) 09/07/2023   CREATININE 0.82 09/07/2023   BUN 9 09/07/2023   CO2 29 09/07/2023   TSH 0.80 09/07/2023   INR 1.0 12/18/2021   HGBA1C 6.0 07/05/2023    MM 3D SCREENING MAMMOGRAM BILATERAL BREAST Result Date: 05/10/2023 CLINICAL DATA:  Screening. EXAM: DIGITAL SCREENING BILATERAL MAMMOGRAM WITH TOMOSYNTHESIS AND CAD TECHNIQUE: Bilateral screening digital craniocaudal and mediolateral oblique mammograms were obtained. Bilateral screening digital breast tomosynthesis was performed. The images were evaluated with computer-aided detection. COMPARISON:  Previous exam(s). ACR Breast Density Category b: There are scattered areas of fibroglandular density. FINDINGS: There are no findings suspicious for malignancy. IMPRESSION: No mammographic evidence of malignancy. A result letter of this screening mammogram will be mailed directly to the patient. RECOMMENDATION: Screening mammogram in one year. (Code:SM-B-01Y) BI-RADS CATEGORY  1: Negative. Electronically Signed   By: Rosaline Collet M.D.   On: 05/10/2023 11:07    Assessment & Plan:  Problem List Items Addressed This Visit     Vitamin B12 deficiency   On B12      Vitamin D  deficiency   Relevant Medications   cyanocobalamin  (VITAMIN B12) 1000 MCG/ML injection   Chronic obstructive airway disease with asthma (HCC)   Chronic Pulmicort , Combivent  Prom-cod syr  Potential benefits of a long term opioids  use as well as potential risks (i.e. addiction risk, apnea etc) and complications (i.e. Somnolence, constipation and others) were explained to the patient and were aknowledged.       Relevant Medications   promethazine -dextromethorphan (PROMETHAZINE -DM) 6.25-15 MG/5ML syrup   Urticaria   Relevant Medications   cyanocobalamin  (VITAMIN B12) 1000 MCG/ML injection   LOW BACK PAIN   Chronic  Cont on Tylenol  #3 prn  Potential benefits of a long term opioids use as well as potential risks (i.e. addiction risk, apnea etc) and complications (i.e. Somnolence, constipation and others) were explained to the patient and were aknowledged. Flexeril        Depression   Continue with Lexapro ,  Vraylar  low dose      Relevant Medications   escitalopram  (LEXAPRO ) 20 MG tablet   Other Relevant Orders   TSH   Hypokalemia - Primary   Recurrent Increase K dur to 20 bid        Relevant Orders   Comprehensive metabolic panel with GFR   Other Visit Diagnoses       B12 deficiency       Relevant Medications   cyanocobalamin  (VITAMIN B12) 1000 MCG/ML injection         Meds ordered this encounter  Medications   atorvastatin  (LIPITOR) 40 MG tablet    Sig: Take 1 tablet (40 mg total) by mouth daily.    Dispense:  90 tablet    Refill:  1   escitalopram  (LEXAPRO ) 20 MG tablet    Sig: TAKE 1 TABLET(20 MG) BY MOUTH DAILY    Dispense:  90 tablet    Refill:  3   clonazePAM  (KLONOPIN ) 1 MG tablet    Sig: TAKE 2 TABLETS BY MOUTH EVERY NIGHT AT BEDTIME AND 1/2 TABLET EXTRA IN DAYTIME AS NEEDED    Dispense:  225 tablet    Refill:  1   Insulin  Syringe-Needle U-100 (B-D INSULIN  SYRINGE 1CC/25GX1) 25G X 1 1 ML MISC    Sig: USE AS DIRECTED EVERY 2 WEEKS    Dispense:  50 each    Refill:  2   cyanocobalamin  (VITAMIN B12) 1000 MCG/ML injection    Sig: INJECT 1 ML IN THE MUSCLE EVERY 14 DAYS    Dispense:  10 mL    Refill:  5   promethazine -dextromethorphan (PROMETHAZINE -DM) 6.25-15 MG/5ML syrup     Sig: TAKE 5 ML BY MOUTH FOUR TIMES DAILY AS NEEDED FOR COUGH    Dispense:  720 mL    Refill:  3   potassium chloride  SA (KLOR-CON  M) 20 MEQ tablet    Sig: Take 1 tablet (20 mEq total) by mouth 2 (two) times daily.    Dispense:  180 tablet    Refill:  3      Follow-up: Return in about 3 months (around 12/09/2023) for a follow-up visit.  Marolyn Noel, MD

## 2023-09-08 NOTE — Assessment & Plan Note (Signed)
Continue with Lexapro,  Vraylar low dose

## 2023-09-08 NOTE — Assessment & Plan Note (Signed)
 Chronic Pulmicort , Combivent  Prom-cod syr  Potential benefits of a long term opioids use as well as potential risks (i.e. addiction risk, apnea etc) and complications (i.e. Somnolence, constipation and others) were explained to the patient and were aknowledged.

## 2023-09-08 NOTE — Assessment & Plan Note (Signed)
 Recurrent Increase K dur to 20 bid

## 2023-09-08 NOTE — Assessment & Plan Note (Signed)
 On B12

## 2023-09-12 ENCOUNTER — Ambulatory Visit (INDEPENDENT_AMBULATORY_CARE_PROVIDER_SITE_OTHER): Payer: Medicare Other | Admitting: Psychology

## 2023-09-12 DIAGNOSIS — F331 Major depressive disorder, recurrent, moderate: Secondary | ICD-10-CM | POA: Diagnosis not present

## 2023-09-12 NOTE — Progress Notes (Signed)
 [facility] Counselor/Therapist Progress Note  Patient ID: Veronica [name], MRN: [id number],    Date: 09/12/2023  Time Spent: 12:00pm-12:45pm   45 minutes   Treatment Type: Individual Therapy  Reported Symptoms: stress, feeling down  Mental Status Exam: Appearance:  Casual     Behavior: Appropriate  Motor: Normal  Speech/Language:  Normal Rate  Affect: Appropriate  Mood: normal  Thought process: normal  Thought content:   WNL  Sensory/Perceptual disturbances:   WNL  Orientation: oriented to person, place, time/date, and situation  Attention: Good  Concentration: Good  Memory: WNL  Fund of knowledge:  Good  Insight:   Good  Judgment:  Good  Impulse Control: Good   Risk Assessment: Danger to Self:  No Self-injurious Behavior: No Danger to Others: No Duty to Warn:no Physical Aggression / Violence:No  Access to Firearms a concern: No  Gang Involvement:No   Subjective: Pt present for face-to-face individual therapy via video.  Pt consents to telehealth video session and is aware of limitations and benefits of virtual sessions.  Location of pt: home Location of therapist: home office.   Pt talked about her health.  Pt states she has still been feeling down about her health.  She often has pain issues.  Worked on coping strategies.   Pt talked about her relationship with her brother Veronica [name].  He has not been in a good mood and has been short with pt.   Addressed how this impacts pt.  She states it hurts her feelings.  Helped pt process her feelings and relationship dynamics.  Pt has been putting pressure on herself for not getting things done.  Pt has been organizing her medications and paper work.  Encouraged pt to prioritize pacing herself and resting and taking care of herself.  Worked on self care strategies.  Provided supportive therapy.  Interventions: Cognitive Behavioral Therapy and Insight-Oriented  Diagnosis: F33.1   Plan of Care: Recommend  ongoing therapy.   Pt participated in setting therapy goals.  Pt wants to have someone to talk to and improve coping skills.   Plan to continue to meet every two weeks.  Pt agrees with treatment plan.  Treatment Plan (Treatment Plan Target Date: 07/03/2024) Client Abilities/Strengths  Pt is bright, engaging, and motivated for therapy.   Client Treatment Preferences  Individual therapy.  Client Statement of Needs  Improve coping skills.  Symptoms  Depressed or irritable mood. Lack of energy. Low self-esteem. Unresolved grief issues.   Problems Addressed  Unipolar Depression Goals 1. Alleviate depressive symptoms and return to previous level of effective functioning. 2. Appropriately grieve the loss in order to normalize mood and to return to previously adaptive level of functioning. Objective Learn and implement behavioral strategies to overcome depression. Target Date: 2024-07-03 Frequency: Biweekly  Progress: 55 Modality: individual  Related Interventions Engage the client in behavioral activation, increasing his/her activity level and contact with sources of reward, while identifying processes that inhibit activation.  Use behavioral techniques such as instruction, rehearsal, role-playing, role reversal, as needed, to facilitate activity in the client's daily life; reinforce success. Assist the client in developing skills that increase the likelihood of deriving pleasure from behavioral activation (e.g., assertiveness skills, developing an exercise plan, less internal/more external focus, increased social involvement); reinforce success. Objective Identify important people in life, past and present, and describe the quality, good and poor, of those relationships. Target Date: 2024-07-03 Frequency: Biweekly  Progress: 55 Modality: individual  Related Interventions Conduct Interpersonal Therapy beginning with the assessment  of the client's interpersonal inventory of important past  and present relationships; develop a case formulation linking depression to grief, interpersonal role disputes, role transitions, and/or interpersonal deficits). Objective Learn and implement problem-solving and decision-making skills. Target Date: 2024-07-03 Frequency: Biweekly  Progress: 55 Modality: individual  Related Interventions Conduct Problem-Solving Therapy using techniques such as psychoeducation, modeling, and role-playing to teach client problem-solving skills (i.e., defining a problem specifically, generating possible solutions, evaluating the pros and cons of each solution, selecting and implementing a plan of action, evaluating the efficacy of the plan, accepting or revising the plan); role-play application of the problem-solving skill to a real life issue. Encourage in the client the development of a positive problem orientation in which problems and solving them are viewed as a natural part of life and not something to be feared, despaired, or avoided. 3. Develop healthy interpersonal relationships that lead to the alleviation and help prevent the relapse of depression. 4. Develop healthy thinking patterns and beliefs about self, others, and the world that lead to the alleviation and help prevent the relapse of depression. 5. Recognize, accept, and cope with feelings of depression. Diagnosis F33.1  Conditions For Discharge Achievement of treatment goals and objectives   Veronica Alma, LCSW

## 2023-09-15 ENCOUNTER — Ambulatory Visit: Admitting: Internal Medicine

## 2023-09-15 ENCOUNTER — Telehealth: Payer: Self-pay | Admitting: Internal Medicine

## 2023-09-15 NOTE — Telephone Encounter (Signed)
 Copied from CRM [id number]. Topic: Clinical - Medication Question >> Sep 15, 2023 11:53 AM Mesmerise C wrote: Reason for CRM: Patient requested Nurse Hadassah to send refill request for her Vaylar it's her a patient assistance medication states it comes from [place] and Hadassah give her a call once it's received and she picks it up [phone]

## 2023-09-20 ENCOUNTER — Telehealth: Payer: Self-pay

## 2023-09-20 NOTE — Telephone Encounter (Signed)
 Copied from CRM [id number]. Topic: Clinical - Lab/Test Results >> Sep 20, 2023  1:56 PM Ernestene P wrote: Reason for CRM: Pt called regarding missed call - relayed labs result with pt- pt advise potassium has been doubled so she will start to take that, pt also mention she is getting low on Vaylar

## 2023-09-20 NOTE — Telephone Encounter (Signed)
 Do I have paperwork to sign?  Thank you

## 2023-09-21 NOTE — Telephone Encounter (Signed)
 Rx signed. Pls fax Thx

## 2023-09-22 ENCOUNTER — Ambulatory Visit: Admitting: Psychology

## 2023-09-22 ENCOUNTER — Ambulatory Visit (INDEPENDENT_AMBULATORY_CARE_PROVIDER_SITE_OTHER): Admitting: Psychology

## 2023-09-22 DIAGNOSIS — F331 Major depressive disorder, recurrent, moderate: Secondary | ICD-10-CM | POA: Diagnosis not present

## 2023-09-22 NOTE — Progress Notes (Signed)
 [facility] Counselor/Therapist Progress Note  Patient ID: Veronica [name], MRN: [id number],    Date: 09/22/2023  Time Spent: 10:00am-10:45am   45 minutes   Treatment Type: Individual Therapy  Reported Symptoms: stress, feeling down  Mental Status Exam: Appearance:  Casual     Behavior: Appropriate  Motor: Normal  Speech/Language:  Normal Rate  Affect: Appropriate  Mood: normal  Thought process: normal  Thought content:   WNL  Sensory/Perceptual disturbances:   WNL  Orientation: oriented to person, place, time/date, and situation  Attention: Good  Concentration: Good  Memory: WNL  Fund of knowledge:  Good  Insight:   Good  Judgment:  Good  Impulse Control: Good   Risk Assessment: Danger to Self:  No Self-injurious Behavior: No Danger to Others: No Duty to Warn:no Physical Aggression / Violence:No  Access to Firearms a concern: No  Gang Involvement:No   Subjective: Pt present for face-to-face individual therapy via video.  Pt consents to telehealth video session and is aware of limitations and benefits of virtual sessions.  Location of pt: home Location of therapist: home office.   Pt talked about her health.  Pt states she had a TIA episode this week and this morning.   She is going to call the doctor to address it.  Pt is worried about what she is experiencing.  Encouraged pt to be seen by a doctor today.  Pt agreed to be sure and seek medical care today.  Pt states she is feeling down about her health.   Worked on coping strategies.   Pt has been putting pressure on herself for not getting things done.  Pt has been organizing her medications and paper work.  Encouraged pt to prioritize pacing herself and resting and taking care of herself.  Worked on self care strategies.  Provided supportive therapy.  Interventions: Cognitive Behavioral Therapy and Insight-Oriented  Diagnosis: F33.1   Plan of Care: Recommend ongoing therapy.   Pt participated in  setting therapy goals.  Pt wants to have someone to talk to and improve coping skills.   Plan to continue to meet every two weeks.  Pt agrees with treatment plan.  Treatment Plan (Treatment Plan Target Date: 07/03/2024) Client Abilities/Strengths  Pt is bright, engaging, and motivated for therapy.   Client Treatment Preferences  Individual therapy.  Client Statement of Needs  Improve coping skills.  Symptoms  Depressed or irritable mood. Lack of energy. Low self-esteem. Unresolved grief issues.   Problems Addressed  Unipolar Depression Goals 1. Alleviate depressive symptoms and return to previous level of effective functioning. 2. Appropriately grieve the loss in order to normalize mood and to return to previously adaptive level of functioning. Objective Learn and implement behavioral strategies to overcome depression. Target Date: 2024-07-03 Frequency: Biweekly  Progress: 55 Modality: individual  Related Interventions Engage the client in behavioral activation, increasing his/her activity level and contact with sources of reward, while identifying processes that inhibit activation.  Use behavioral techniques such as instruction, rehearsal, role-playing, role reversal, as needed, to facilitate activity in the client's daily life; reinforce success. Assist the client in developing skills that increase the likelihood of deriving pleasure from behavioral activation (e.g., assertiveness skills, developing an exercise plan, less internal/more external focus, increased social involvement); reinforce success. Objective Identify important people in life, past and present, and describe the quality, good and poor, of those relationships. Target Date: 2024-07-03 Frequency: Biweekly  Progress: 55 Modality: individual  Related Interventions Conduct Interpersonal Therapy beginning with the assessment  of the client's interpersonal inventory of important past and present relationships; develop a  case formulation linking depression to grief, interpersonal role disputes, role transitions, and/or interpersonal deficits). Objective Learn and implement problem-solving and decision-making skills. Target Date: 2024-07-03 Frequency: Biweekly  Progress: 55 Modality: individual  Related Interventions Conduct Problem-Solving Therapy using techniques such as psychoeducation, modeling, and role-playing to teach client problem-solving skills (i.e., defining a problem specifically, generating possible solutions, evaluating the pros and cons of each solution, selecting and implementing a plan of action, evaluating the efficacy of the plan, accepting or revising the plan); role-play application of the problem-solving skill to a real life issue. Encourage in the client the development of a positive problem orientation in which problems and solving them are viewed as a natural part of life and not something to be feared, despaired, or avoided. 3. Develop healthy interpersonal relationships that lead to the alleviation and help prevent the relapse of depression. 4. Develop healthy thinking patterns and beliefs about self, others, and the world that lead to the alleviation and help prevent the relapse of depression. 5. Recognize, accept, and cope with feelings of depression. Diagnosis F33.1  Conditions For Discharge Achievement of treatment goals and objectives   [name], LCSW

## 2023-09-23 NOTE — Telephone Encounter (Signed)
 Rx has been faxed to [facility].  I have also called to give a verbal to have rx refilled ASAP.

## 2023-09-26 ENCOUNTER — Ambulatory Visit: Payer: Medicare Other | Admitting: Psychology

## 2023-10-04 ENCOUNTER — Ambulatory Visit: Admitting: Pulmonary Disease

## 2023-10-04 ENCOUNTER — Encounter: Payer: Self-pay | Admitting: Pulmonary Disease

## 2023-10-04 ENCOUNTER — Ambulatory Visit: Payer: Self-pay

## 2023-10-04 VITALS — BP 131/78 | HR 80 | Ht 62.0 in | Wt 146.0 lb

## 2023-10-04 DIAGNOSIS — J471 Bronchiectasis with (acute) exacerbation: Secondary | ICD-10-CM

## 2023-10-04 DIAGNOSIS — J42 Unspecified chronic bronchitis: Secondary | ICD-10-CM | POA: Diagnosis not present

## 2023-10-04 MED ORDER — ALBUTEROL SULFATE (2.5 MG/3ML) 0.083% IN NEBU: INHALATION_SOLUTION | RESPIRATORY_TRACT | 10 refills | Status: AC

## 2023-10-04 MED ORDER — BUDESONIDE 0.5 MG/2ML IN SUSP: 0.5000 mg | Freq: Two times a day (BID) | RESPIRATORY_TRACT | 12 refills | Status: AC

## 2023-10-04 MED ORDER — ARFORMOTEROL TARTRATE 15 MCG/2ML IN NEBU: 15.0000 ug | INHALATION_SOLUTION | Freq: Two times a day (BID) | RESPIRATORY_TRACT | 12 refills | Status: AC

## 2023-10-04 NOTE — Telephone Encounter (Signed)
 FYI Only or Action Required?: Action required by provider: request for appointment.  Patient was last seen in primary care on 09/08/2023 by Plotnikov, Karlynn GAILS, MD.  Called Nurse Triage reporting No chief complaint on file..  Symptoms began several days ago.  Interventions attempted: Nothing.  Symptoms are: gradually worsening.  Triage Disposition: See Physician Within 24 Hours  Patient/caregiver understands and will follow disposition?: UnsureCopied from CRM #[id number]. Topic: Clinical - Red Word Triage >> Oct 04, 2023  1:36 PM [place] wrote: Red Word that prompted transfer to Nurse Triage: Weakness in body- migraine-had injection last week- not feeling Reason for Disposition  Nursing judgment or information in reference  Answer Assessment - Initial Assessment Questions 1. REASON FOR CALL: What is your main concern right now?     Not feeling well overall. Nauseated at times. I just don't feel good. Pt couldn't pinpoint what is wrong. Pt is weak and uses cane. Pt will only see PCP. Pt refused appt for tomorrow. Next appt with PCP is 8/5. RN doesn't feel it is appropriate to wait 2 weeks. Please advise and schedule appt.  Protocols used: No Guideline Available-A-AH
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
## 2023-10-04 NOTE — Patient Instructions (Signed)
 Neck to see you again  I refilled all the nebulizer medicines  I hope you can get stronger, we discussed sticking with the neurologist at [facility] but I would ask that you do not see residents or medical students to help speed up the appointments.  If for whatever reason this cannot be accommodated, please let me know and I can refer you to a neurologist here in [place].  Return to clinic in 3 months or sooner as needed with Dr. Annella

## 2023-10-05 NOTE — Telephone Encounter (Signed)
 Upon pts call back to the clinic, Please make an apptmnt to be seen in office by any provider per pts PCP as I have tried to call the pt and have not gotten an answer.

## 2023-10-05 NOTE — Telephone Encounter (Signed)
 Please schedule office visit with any provider.  Thank you

## 2023-10-10 ENCOUNTER — Telehealth: Payer: Self-pay | Admitting: Internal Medicine

## 2023-10-10 ENCOUNTER — Other Ambulatory Visit: Payer: Self-pay

## 2023-10-10 ENCOUNTER — Ambulatory Visit (INDEPENDENT_AMBULATORY_CARE_PROVIDER_SITE_OTHER): Payer: Medicare Other | Admitting: Psychology

## 2023-10-10 DIAGNOSIS — F332 Major depressive disorder, recurrent severe without psychotic features: Secondary | ICD-10-CM

## 2023-10-10 DIAGNOSIS — F329 Major depressive disorder, single episode, unspecified: Secondary | ICD-10-CM

## 2023-10-10 DIAGNOSIS — F331 Major depressive disorder, recurrent, moderate: Secondary | ICD-10-CM | POA: Diagnosis not present

## 2023-10-10 MED ORDER — CARIPRAZINE HCL 1.5 MG PO CAPS: 1.5000 mg | ORAL_CAPSULE | Freq: Every day | ORAL | 3 refills | Status: DC

## 2023-10-10 NOTE — Telephone Encounter (Signed)
 Copied from CRM [id number]. Topic: Clinical - Prescription Issue >> Oct 10, 2023  2:10 PM Berneda FALCON wrote: Reason for CRM: Pt states there is a patient assistance medication for VRAYLAR  and that has not received notification that it was sent in. This should have been sent on 7/11 per patient. Last CRM says that this was sent. But I do not see that here on my end.  [facility] is the name of the pharmacy.. Patient callback is [phone]

## 2023-10-10 NOTE — Telephone Encounter (Signed)
 Medication has been re-faxed over to [facility].

## 2023-10-10 NOTE — Progress Notes (Signed)
 [facility] Counselor/Therapist Progress Note  Patient ID: Veronica [name], MRN: [id number],    Date: 10/10/2023  Time Spent: 12:00pm-12:45pm   45 minutes   Treatment Type: Individual Therapy  Reported Symptoms: stress, feeling down  Mental Status Exam: Appearance:  Casual     Behavior: Appropriate  Motor: Normal  Speech/Language:  Normal Rate  Affect: Appropriate  Mood: normal  Thought process: normal  Thought content:   WNL  Sensory/Perceptual disturbances:   WNL  Orientation: oriented to person, place, time/date, and situation  Attention: Good  Concentration: Good  Memory: WNL  Fund of knowledge:  Good  Insight:   Good  Judgment:  Good  Impulse Control: Good   Risk Assessment: Danger to Self:  No Self-injurious Behavior: No Danger to Others: No Duty to Warn:no Physical Aggression / Violence:No  Access to Firearms a concern: No  Gang Involvement:No   Subjective: Pt present for face-to-face individual therapy via video.  Pt consents to telehealth video session and is aware of limitations and benefits of virtual sessions.  Location of pt: home Location of therapist: home office.   Pt talked about her health.  Pt has contacted her doctor about the TIA she had and she was told if she experiences similar symptoms again she needs to go to the ER.   Pt has doctors appointments set up to address the issue.  Pt is noticing some deficits especially regarding her hand writing.   Pt states she is feeling down about her health.  Pt is worrying about whether the deficits will go away.   Addressed pt's worries.   Worked on coping strategies.   Pt has been putting pressure on herself for not getting things done.   Encouraged pt to prioritize pacing herself and resting and taking care of herself.  Worked on self care strategies.  Provided supportive therapy.  Interventions: Cognitive Behavioral Therapy and Insight-Oriented  Diagnosis: F33.1   Plan of Care:  Recommend ongoing therapy.   Pt participated in setting therapy goals.  Pt wants to have someone to talk to and improve coping skills.   Plan to continue to meet every two weeks.  Pt agrees with treatment plan.  Treatment Plan (Treatment Plan Target Date: 07/03/2024) Client Abilities/Strengths  Pt is bright, engaging, and motivated for therapy.   Client Treatment Preferences  Individual therapy.  Client Statement of Needs  Improve coping skills.  Symptoms  Depressed or irritable mood. Lack of energy. Low self-esteem. Unresolved grief issues.   Problems Addressed  Unipolar Depression Goals 1. Alleviate depressive symptoms and return to previous level of effective functioning. 2. Appropriately grieve the loss in order to normalize mood and to return to previously adaptive level of functioning. Objective Learn and implement behavioral strategies to overcome depression. Target Date: 2024-07-03 Frequency: Biweekly  Progress: 55 Modality: individual  Related Interventions Engage the client in behavioral activation, increasing his/her activity level and contact with sources of reward, while identifying processes that inhibit activation.  Use behavioral techniques such as instruction, rehearsal, role-playing, role reversal, as needed, to facilitate activity in the client's daily life; reinforce success. Assist the client in developing skills that increase the likelihood of deriving pleasure from behavioral activation (e.g., assertiveness skills, developing an exercise plan, less internal/more external focus, increased social involvement); reinforce success. Objective Identify important people in life, past and present, and describe the quality, good and poor, of those relationships. Target Date: 2024-07-03 Frequency: Biweekly  Progress: 55 Modality: individual  Related Interventions Conduct Interpersonal Therapy  beginning with the assessment of the client's interpersonal inventory of  important past and present relationships; develop a case formulation linking depression to grief, interpersonal role disputes, role transitions, and/or interpersonal deficits). Objective Learn and implement problem-solving and decision-making skills. Target Date: 2024-07-03 Frequency: Biweekly  Progress: 55 Modality: individual  Related Interventions Conduct Problem-Solving Therapy using techniques such as psychoeducation, modeling, and role-playing to teach client problem-solving skills (i.e., defining a problem specifically, generating possible solutions, evaluating the pros and cons of each solution, selecting and implementing a plan of action, evaluating the efficacy of the plan, accepting or revising the plan); role-play application of the problem-solving skill to a real life issue. Encourage in the client the development of a positive problem orientation in which problems and solving them are viewed as a natural part of life and not something to be feared, despaired, or avoided. 3. Develop healthy interpersonal relationships that lead to the alleviation and help prevent the relapse of depression. 4. Develop healthy thinking patterns and beliefs about self, others, and the world that lead to the alleviation and help prevent the relapse of depression. 5. Recognize, accept, and cope with feelings of depression. Diagnosis F33.1  Conditions For Discharge Achievement of treatment goals and objectives   Veronica Alma, LCSW

## 2023-10-18 NOTE — Telephone Encounter (Signed)
 I was able to speak with the pt and inform her that her meds are ready for pick up. Pt has stated understanding and will be in on 10/19/2023 to pick them up.

## 2023-10-19 ENCOUNTER — Telehealth: Payer: Self-pay | Admitting: Internal Medicine

## 2023-10-19 NOTE — Telephone Encounter (Signed)
 Patient said that when the rest of her Vraylar  comes in to call and let her know and she will come pick them up.

## 2023-10-21 NOTE — Telephone Encounter (Signed)
 Spoke with the pt and was able to inform her that her 2nd set of medication has arrived and is ready for pick up. Pt states she will be in to pick up her meds today 10/21/2023.

## 2023-10-24 ENCOUNTER — Ambulatory Visit (INDEPENDENT_AMBULATORY_CARE_PROVIDER_SITE_OTHER): Payer: Medicare Other | Admitting: Psychology

## 2023-10-24 DIAGNOSIS — F331 Major depressive disorder, recurrent, moderate: Secondary | ICD-10-CM | POA: Diagnosis not present

## 2023-10-24 NOTE — Progress Notes (Signed)
 [facility] Counselor/Therapist Progress Note  Patient ID: Veronica [name], MRN: [id number],    Date: 10/24/2023  Time Spent: 12:00pm-12:45pm   45 minutes   Treatment Type: Individual Therapy  Reported Symptoms: stress, feeling down  Mental Status Exam: Appearance:  Casual     Behavior: Appropriate  Motor: Normal  Speech/Language:  Normal Rate  Affect: Appropriate  Mood: normal  Thought process: normal  Thought content:   WNL  Sensory/Perceptual disturbances:   WNL  Orientation: oriented to person, place, time/date, and situation  Attention: Good  Concentration: Good  Memory: WNL  Fund of knowledge:  Good  Insight:   Good  Judgment:  Good  Impulse Control: Good   Risk Assessment: Danger to Self:  No Self-injurious Behavior: No Danger to Others: No Duty to Warn:no Physical Aggression / Violence:No  Access to Firearms a concern: No  Gang Involvement:No   Subjective: Pt present for face-to-face individual therapy via video.  Pt consents to telehealth video session and is aware of limitations and benefits of virtual sessions.  Location of pt: home Location of therapist: home office.   Pt talked about her health.  She has been having GI issues.  She has some upcoming doctors appointments.  Addressed pt's worries.   Worked on coping strategies.   Pt talked about getting down at times.  Addressed the thoughts that have contributed to her down mood.   She has been worrying about her brother and money.   Addressed pt's worries. Pt has been putting pressure on herself for not getting things done.   Encouraged pt to prioritize pacing herself and resting and taking care of herself.  Worked on self care strategies.  Provided supportive therapy.  Interventions: Cognitive Behavioral Therapy and Insight-Oriented  Diagnosis: F33.1   Plan of Care: Recommend ongoing therapy.   Pt participated in setting therapy goals.  Pt wants to have someone to talk to and improve  coping skills.   Plan to continue to meet every two weeks.  Pt agrees with treatment plan.  Treatment Plan (Treatment Plan Target Date: 07/03/2024) Client Abilities/Strengths  Pt is bright, engaging, and motivated for therapy.   Client Treatment Preferences  Individual therapy.  Client Statement of Needs  Improve coping skills.  Symptoms  Depressed or irritable mood. Lack of energy. Low self-esteem. Unresolved grief issues.   Problems Addressed  Unipolar Depression Goals 1. Alleviate depressive symptoms and return to previous level of effective functioning. 2. Appropriately grieve the loss in order to normalize mood and to return to previously adaptive level of functioning. Objective Learn and implement behavioral strategies to overcome depression. Target Date: 2024-07-03 Frequency: Biweekly  Progress: 55 Modality: individual  Related Interventions Engage the client in behavioral activation, increasing his/her activity level and contact with sources of reward, while identifying processes that inhibit activation.  Use behavioral techniques such as instruction, rehearsal, role-playing, role reversal, as needed, to facilitate activity in the client's daily life; reinforce success. Assist the client in developing skills that increase the likelihood of deriving pleasure from behavioral activation (e.g., assertiveness skills, developing an exercise plan, less internal/more external focus, increased social involvement); reinforce success. Objective Identify important people in life, past and present, and describe the quality, good and poor, of those relationships. Target Date: 2024-07-03 Frequency: Biweekly  Progress: 55 Modality: individual  Related Interventions Conduct Interpersonal Therapy beginning with the assessment of the client's interpersonal inventory of important past and present relationships; develop a case formulation linking depression to grief, interpersonal role  disputes, role transitions, and/or interpersonal deficits). Objective Learn and implement problem-solving and decision-making skills. Target Date: 2024-07-03 Frequency: Biweekly  Progress: 55 Modality: individual  Related Interventions Conduct Problem-Solving Therapy using techniques such as psychoeducation, modeling, and role-playing to teach client problem-solving skills (i.e., defining a problem specifically, generating possible solutions, evaluating the pros and cons of each solution, selecting and implementing a plan of action, evaluating the efficacy of the plan, accepting or revising the plan); role-play application of the problem-solving skill to a real life issue. Encourage in the client the development of a positive problem orientation in which problems and solving them are viewed as a natural part of life and not something to be feared, despaired, or avoided. 3. Develop healthy interpersonal relationships that lead to the alleviation and help prevent the relapse of depression. 4. Develop healthy thinking patterns and beliefs about self, others, and the world that lead to the alleviation and help prevent the relapse of depression. 5. Recognize, accept, and cope with feelings of depression. Diagnosis F33.1  Conditions For Discharge Achievement of treatment goals and objectives   [name], LCSW

## 2023-11-07 ENCOUNTER — Ambulatory Visit (INDEPENDENT_AMBULATORY_CARE_PROVIDER_SITE_OTHER): Payer: Medicare Other | Admitting: Psychology

## 2023-11-07 DIAGNOSIS — F331 Major depressive disorder, recurrent, moderate: Secondary | ICD-10-CM

## 2023-11-07 NOTE — Progress Notes (Signed)
 [facility] Counselor/Therapist Progress Note  Patient ID: Veronica [name], MRN: [id number],    Date: 11/07/2023  Time Spent: 12:00pm-12:45pm   45 minutes   Treatment Type: Individual Therapy  Reported Symptoms: stress, feeling down  Mental Status Exam: Appearance:  Casual     Behavior: Appropriate  Motor: Normal  Speech/Language:  Normal Rate  Affect: Appropriate  Mood: normal  Thought process: normal  Thought content:   WNL  Sensory/Perceptual disturbances:   WNL  Orientation: oriented to person, place, time/date, and situation  Attention: Good  Concentration: Good  Memory: WNL  Fund of knowledge:  Good  Insight:   Good  Judgment:  Good  Impulse Control: Good   Risk Assessment: Danger to Self:  No Self-injurious Behavior: No Danger to Others: No Duty to Warn:no Physical Aggression / Violence:No  Access to Firearms a concern: No  Gang Involvement:No   Subjective: Pt present for face-to-face individual therapy via video.  Pt consents to telehealth video session and is aware of limitations and benefits of virtual sessions.  Location of pt: home Location of therapist: home office.   Pt talked about her health.  She has had trouble with medications that have been making her extra sleepy during the day.  Sleeping so much during the day has made it hard for pt to get things done and disrupts her night time sleep at times.  Addressed pt's worries and worked on problem solving.   Pt talked about getting down at times.  Addressed the thoughts that have contributed to her down mood.   She has been worrying about her brother who is having to take care of his wife who has significant health issues.   Addressed pt's worries.  Worked on coping strategies.   Pt has been putting pressure on herself for not getting things done.   Encouraged pt to prioritize pacing herself and resting and taking care of herself.  Worked on self care strategies.  Provided supportive  therapy.  Interventions: Cognitive Behavioral Therapy and Insight-Oriented  Diagnosis: F33.1   Plan of Care: Recommend ongoing therapy.   Pt participated in setting therapy goals.  Pt wants to have someone to talk to and improve coping skills.   Plan to continue to meet every two weeks.  Pt agrees with treatment plan.  Treatment Plan (Treatment Plan Target Date: 07/03/2024) Client Abilities/Strengths  Pt is bright, engaging, and motivated for therapy.   Client Treatment Preferences  Individual therapy.  Client Statement of Needs  Improve coping skills.  Symptoms  Depressed or irritable mood. Lack of energy. Low self-esteem. Unresolved grief issues.   Problems Addressed  Unipolar Depression Goals 1. Alleviate depressive symptoms and return to previous level of effective functioning. 2. Appropriately grieve the loss in order to normalize mood and to return to previously adaptive level of functioning. Objective Learn and implement behavioral strategies to overcome depression. Target Date: 2024-07-03 Frequency: Biweekly  Progress: 55 Modality: individual  Related Interventions Engage the client in behavioral activation, increasing his/her activity level and contact with sources of reward, while identifying processes that inhibit activation.  Use behavioral techniques such as instruction, rehearsal, role-playing, role reversal, as needed, to facilitate activity in the client's daily life; reinforce success. Assist the client in developing skills that increase the likelihood of deriving pleasure from behavioral activation (e.g., assertiveness skills, developing an exercise plan, less internal/more external focus, increased social involvement); reinforce success. Objective Identify important people in life, past and present, and describe the quality, good and  poor, of those relationships. Target Date: 2024-07-03 Frequency: Biweekly  Progress: 55 Modality: individual  Related  Interventions Conduct Interpersonal Therapy beginning with the assessment of the client's interpersonal inventory of important past and present relationships; develop a case formulation linking depression to grief, interpersonal role disputes, role transitions, and/or interpersonal deficits). Objective Learn and implement problem-solving and decision-making skills. Target Date: 2024-07-03 Frequency: Biweekly  Progress: 55 Modality: individual  Related Interventions Conduct Problem-Solving Therapy using techniques such as psychoeducation, modeling, and role-playing to teach client problem-solving skills (i.e., defining a problem specifically, generating possible solutions, evaluating the pros and cons of each solution, selecting and implementing a plan of action, evaluating the efficacy of the plan, accepting or revising the plan); role-play application of the problem-solving skill to a real life issue. Encourage in the client the development of a positive problem orientation in which problems and solving them are viewed as a natural part of life and not something to be feared, despaired, or avoided. 3. Develop healthy interpersonal relationships that lead to the alleviation and help prevent the relapse of depression. 4. Develop healthy thinking patterns and beliefs about self, others, and the world that lead to the alleviation and help prevent the relapse of depression. 5. Recognize, accept, and cope with feelings of depression. Diagnosis F33.1  Conditions For Discharge Achievement of treatment goals and objectives   [name], LCSW

## 2023-11-21 ENCOUNTER — Ambulatory Visit (INDEPENDENT_AMBULATORY_CARE_PROVIDER_SITE_OTHER): Payer: Medicare Other | Admitting: Psychology

## 2023-11-21 DIAGNOSIS — F331 Major depressive disorder, recurrent, moderate: Secondary | ICD-10-CM | POA: Diagnosis not present

## 2023-11-21 NOTE — Progress Notes (Signed)
 [facility] Counselor/Therapist Progress Note  Patient ID: Veronica [name], MRN: [id number],    Date: 11/21/2023  Time Spent: 12:00pm-12:45pm   45 minutes   Treatment Type: Individual Therapy  Reported Symptoms: stress, feeling down  Mental Status Exam: Appearance:  Casual     Behavior: Appropriate  Motor: Normal  Speech/Language:  Normal Rate  Affect: Appropriate  Mood: normal  Thought process: normal  Thought content:   WNL  Sensory/Perceptual disturbances:   WNL  Orientation: oriented to person, place, time/date, and situation  Attention: Good  Concentration: Good  Memory: WNL  Fund of knowledge:  Good  Insight:   Good  Judgment:  Good  Impulse Control: Good   Risk Assessment: Danger to Self:  No Self-injurious Behavior: No Danger to Others: No Duty to Warn:no Physical Aggression / Violence:No  Access to Firearms a concern: No  Gang Involvement:No   Subjective: Pt present for face-to-face individual therapy via video.  Pt consents to telehealth video session and is aware of limitations and benefits of virtual sessions.  Location of pt: home Location of therapist: home office.   Pt talked about her health.  She has had trouble allergies being bad.   She has to worry about getting bronchitis which often occurs each fall.  Addressed pt's worries and worked on problem solving.   Pt talked about her cousin passing away recently.  Pt's neighbor also passed away and pt was close to her.  Pt plans to attend the funeral this week.  Helped pt process her feelings and grief.   Pt has been putting pressure on herself for not getting things done.   Encouraged pt to prioritize pacing herself and resting and taking care of herself.  Worked on self care strategies.  Provided supportive therapy.  Interventions: Cognitive Behavioral Therapy and Insight-Oriented  Diagnosis: F33.1   Plan of Care: Recommend ongoing therapy.   Pt participated in setting therapy goals.   Pt wants to have someone to talk to and improve coping skills.   Plan to continue to meet every two weeks.  Pt agrees with treatment plan.  Treatment Plan (Treatment Plan Target Date: 07/03/2024) Client Abilities/Strengths  Pt is bright, engaging, and motivated for therapy.   Client Treatment Preferences  Individual therapy.  Client Statement of Needs  Improve coping skills.  Symptoms  Depressed or irritable mood. Lack of energy. Low self-esteem. Unresolved grief issues.   Problems Addressed  Unipolar Depression Goals 1. Alleviate depressive symptoms and return to previous level of effective functioning. 2. Appropriately grieve the loss in order to normalize mood and to return to previously adaptive level of functioning. Objective Learn and implement behavioral strategies to overcome depression. Target Date: 2024-07-03 Frequency: Biweekly  Progress: 55 Modality: individual  Related Interventions Engage the client in behavioral activation, increasing his/her activity level and contact with sources of reward, while identifying processes that inhibit activation.  Use behavioral techniques such as instruction, rehearsal, role-playing, role reversal, as needed, to facilitate activity in the client's daily life; reinforce success. Assist the client in developing skills that increase the likelihood of deriving pleasure from behavioral activation (e.g., assertiveness skills, developing an exercise plan, less internal/more external focus, increased social involvement); reinforce success. Objective Identify important people in life, past and present, and describe the quality, good and poor, of those relationships. Target Date: 2024-07-03 Frequency: Biweekly  Progress: 55 Modality: individual  Related Interventions Conduct Interpersonal Therapy beginning with the assessment of the client's interpersonal inventory of important past and present  relationships; develop a case formulation  linking depression to grief, interpersonal role disputes, role transitions, and/or interpersonal deficits). Objective Learn and implement problem-solving and decision-making skills. Target Date: 2024-07-03 Frequency: Biweekly  Progress: 55 Modality: individual  Related Interventions Conduct Problem-Solving Therapy using techniques such as psychoeducation, modeling, and role-playing to teach client problem-solving skills (i.e., defining a problem specifically, generating possible solutions, evaluating the pros and cons of each solution, selecting and implementing a plan of action, evaluating the efficacy of the plan, accepting or revising the plan); role-play application of the problem-solving skill to a real life issue. Encourage in the client the development of a positive problem orientation in which problems and solving them are viewed as a natural part of life and not something to be feared, despaired, or avoided. 3. Develop healthy interpersonal relationships that lead to the alleviation and help prevent the relapse of depression. 4. Develop healthy thinking patterns and beliefs about self, others, and the world that lead to the alleviation and help prevent the relapse of depression. 5. Recognize, accept, and cope with feelings of depression. Diagnosis F33.1  Conditions For Discharge Achievement of treatment goals and objectives   Veronica Alma, LCSW

## 2023-11-24 ENCOUNTER — Other Ambulatory Visit: Payer: Self-pay | Admitting: Internal Medicine

## 2023-11-29 ENCOUNTER — Telehealth: Payer: Self-pay | Admitting: Radiology

## 2023-11-29 NOTE — Telephone Encounter (Signed)
 Copied from CRM [id number]. Topic: Appointments - Appointment Info/Confirmation >> Nov 29, 2023 11:58 AM Laymon HERO wrote: Patient/patient representative is calling for information regarding an appointment.

## 2023-12-01 ENCOUNTER — Telehealth: Payer: Self-pay | Admitting: Radiology

## 2023-12-01 ENCOUNTER — Other Ambulatory Visit: Payer: Self-pay | Admitting: Internal Medicine

## 2023-12-01 NOTE — Telephone Encounter (Signed)
 Copied from CRM [id number]. Topic: Appointments - Appointment Info/Confirmation >> Nov 29, 2023 11:58 AM Laymon HERO wrote: Patient/patient representative is calling for information regarding an appointment. >> Dec 01, 2023  1:52 PM Franky GRADE wrote: Patient returning a call she received from [facility] , called [facility] but she was on the line with another patient. She wanted to know what the call was regarding if the original call was only to confirm her appointment.

## 2023-12-01 NOTE — Telephone Encounter (Signed)
 LVM for patient

## 2023-12-02 NOTE — Telephone Encounter (Signed)
 I was returning patients call from 09/16. Patient has an appt scheduled with PCP on 09/23

## 2023-12-05 ENCOUNTER — Other Ambulatory Visit

## 2023-12-05 ENCOUNTER — Ambulatory Visit (INDEPENDENT_AMBULATORY_CARE_PROVIDER_SITE_OTHER): Payer: Medicare Other | Admitting: Psychology

## 2023-12-05 DIAGNOSIS — E876 Hypokalemia: Secondary | ICD-10-CM | POA: Diagnosis not present

## 2023-12-05 DIAGNOSIS — F331 Major depressive disorder, recurrent, moderate: Secondary | ICD-10-CM | POA: Diagnosis not present

## 2023-12-05 DIAGNOSIS — F329 Major depressive disorder, single episode, unspecified: Secondary | ICD-10-CM

## 2023-12-05 LAB — COMPREHENSIVE METABOLIC PANEL WITH GFR
ALT: 21 U/L (ref 0–35)
AST: 32 U/L (ref 0–37)
Albumin: 4.1 g/dL (ref 3.5–5.2)
Alkaline Phosphatase: 127 U/L — ABNORMAL HIGH (ref 39–117)
BUN: 11 mg/dL (ref 6–23)
CO2: 33 meq/L — ABNORMAL HIGH (ref 19–32)
Calcium: 8.7 mg/dL (ref 8.4–10.5)
Chloride: 97 meq/L (ref 96–112)
Creatinine, Ser: 0.88 mg/dL (ref 0.40–1.20)
GFR: 64.61 mL/min (ref >60.00)
Glucose, Bld: 93 mg/dL (ref 70–99)
Potassium: 4.4 meq/L (ref 3.5–5.1)
Sodium: 138 meq/L (ref 135–145)
Total Bilirubin: 0.4 mg/dL (ref 0.2–1.2)
Total Protein: 7.3 g/dL (ref 6.0–8.3)

## 2023-12-05 LAB — TSH: TSH: 1.29 u[IU]/mL (ref 0.35–5.50)

## 2023-12-05 NOTE — Progress Notes (Signed)
 [facility] Counselor/Therapist Progress Note  Patient ID: Veronica [name], MRN: [id number],    Date: 12/05/2023  Time Spent: 12:00pm-12:45pm   45 minutes   Treatment Type: Individual Therapy  Reported Symptoms: stress, feeling down  Mental Status Exam: Appearance:  Casual     Behavior: Appropriate  Motor: Normal  Speech/Language:  Normal Rate  Affect: Appropriate  Mood: normal  Thought process: normal  Thought content:   WNL  Sensory/Perceptual disturbances:   WNL  Orientation: oriented to person, place, time/date, and situation  Attention: Good  Concentration: Good  Memory: WNL  Fund of knowledge:  Good  Insight:   Good  Judgment:  Good  Impulse Control: Good   Risk Assessment: Danger to Self:  No Self-injurious Behavior: No Danger to Others: No Duty to Warn:no Physical Aggression / Violence:No  Access to Firearms a concern: No  Gang Involvement:No   Subjective: Pt present for face-to-face individual therapy via video.  Pt consents to telehealth video session and is aware of limitations and benefits of virtual sessions.  Location of pt: home Location of therapist: home office.   Pt talked about her health.  She fell and hurt her right knee.   She will see her PCP tomorrow to address her knee pain.   Addressed pt's health worries.  Pt bought a walker for better stability and is planning to get a life line devise so she can access help when needed.  Pt talked about her neighbor who passed away.  Pt attended the funeral last week and it helped her with closure.  Helped pt process her feelings and grief.   Pt has been putting pressure on herself for not getting things done.   Encouraged pt to prioritize pacing herself and resting and taking care of herself.  Worked on self care strategies.  Provided supportive therapy.  Interventions: Cognitive Behavioral Therapy and Insight-Oriented  Diagnosis: F33.1   Plan of Care: Recommend ongoing therapy.   Pt  participated in setting therapy goals.  Pt wants to have someone to talk to and improve coping skills.   Plan to continue to meet every two weeks.  Pt agrees with treatment plan.  Treatment Plan (Treatment Plan Target Date: 07/03/2024) Client Abilities/Strengths  Pt is bright, engaging, and motivated for therapy.   Client Treatment Preferences  Individual therapy.  Client Statement of Needs  Improve coping skills.  Symptoms  Depressed or irritable mood. Lack of energy. Low self-esteem. Unresolved grief issues.   Problems Addressed  Unipolar Depression Goals 1. Alleviate depressive symptoms and return to previous level of effective functioning. 2. Appropriately grieve the loss in order to normalize mood and to return to previously adaptive level of functioning. Objective Learn and implement behavioral strategies to overcome depression. Target Date: 2024-07-03 Frequency: Biweekly  Progress: 55 Modality: individual  Related Interventions Engage the client in behavioral activation, increasing his/her activity level and contact with sources of reward, while identifying processes that inhibit activation.  Use behavioral techniques such as instruction, rehearsal, role-playing, role reversal, as needed, to facilitate activity in the client's daily life; reinforce success. Assist the client in developing skills that increase the likelihood of deriving pleasure from behavioral activation (e.g., assertiveness skills, developing an exercise plan, less internal/more external focus, increased social involvement); reinforce success. Objective Identify important people in life, past and present, and describe the quality, good and poor, of those relationships. Target Date: 2024-07-03 Frequency: Biweekly  Progress: 55 Modality: individual  Related Interventions Conduct Interpersonal Therapy beginning with the  assessment of the client's interpersonal inventory of important past and present  relationships; develop a case formulation linking depression to grief, interpersonal role disputes, role transitions, and/or interpersonal deficits). Objective Learn and implement problem-solving and decision-making skills. Target Date: 2024-07-03 Frequency: Biweekly  Progress: 55 Modality: individual  Related Interventions Conduct Problem-Solving Therapy using techniques such as psychoeducation, modeling, and role-playing to teach client problem-solving skills (i.e., defining a problem specifically, generating possible solutions, evaluating the pros and cons of each solution, selecting and implementing a plan of action, evaluating the efficacy of the plan, accepting or revising the plan); role-play application of the problem-solving skill to a real life issue. Encourage in the client the development of a positive problem orientation in which problems and solving them are viewed as a natural part of life and not something to be feared, despaired, or avoided. 3. Develop healthy interpersonal relationships that lead to the alleviation and help prevent the relapse of depression. 4. Develop healthy thinking patterns and beliefs about self, others, and the world that lead to the alleviation and help prevent the relapse of depression. 5. Recognize, accept, and cope with feelings of depression. Diagnosis F33.1  Conditions For Discharge Achievement of treatment goals and objectives   [name], LCSW

## 2023-12-06 ENCOUNTER — Ambulatory Visit: Payer: Self-pay | Admitting: Internal Medicine

## 2023-12-06 ENCOUNTER — Ambulatory Visit (INDEPENDENT_AMBULATORY_CARE_PROVIDER_SITE_OTHER)

## 2023-12-06 ENCOUNTER — Encounter: Payer: Self-pay | Admitting: Internal Medicine

## 2023-12-06 ENCOUNTER — Ambulatory Visit: Admitting: Internal Medicine

## 2023-12-06 ENCOUNTER — Telehealth: Payer: Self-pay

## 2023-12-06 VITALS — BP 110/80 | HR 73 | Temp 98.1°F | Ht 62.0 in | Wt 149.0 lb

## 2023-12-06 DIAGNOSIS — F419 Anxiety disorder, unspecified: Secondary | ICD-10-CM

## 2023-12-06 DIAGNOSIS — J471 Bronchiectasis with (acute) exacerbation: Secondary | ICD-10-CM | POA: Diagnosis not present

## 2023-12-06 DIAGNOSIS — M25561 Pain in right knee: Secondary | ICD-10-CM | POA: Diagnosis not present

## 2023-12-06 DIAGNOSIS — E538 Deficiency of other specified B group vitamins: Secondary | ICD-10-CM

## 2023-12-06 DIAGNOSIS — G8929 Other chronic pain: Secondary | ICD-10-CM

## 2023-12-06 DIAGNOSIS — F329 Major depressive disorder, single episode, unspecified: Secondary | ICD-10-CM | POA: Diagnosis not present

## 2023-12-06 DIAGNOSIS — E890 Postprocedural hypothyroidism: Secondary | ICD-10-CM

## 2023-12-06 MED ORDER — PROMETHAZINE-DM 6.25-15 MG/5ML PO SYRP: ORAL_SOLUTION | ORAL | 3 refills | Status: AC

## 2023-12-06 MED ORDER — METHYLPREDNISOLONE ACETATE 40 MG/ML IJ SUSP
40.0000 mg | Freq: Once | INTRAMUSCULAR | Status: AC
  Administered 2023-12-06: 40 mg via INTRAMUSCULAR

## 2023-12-06 MED ORDER — KETOROLAC TROMETHAMINE 60 MG/2ML IM SOLN
60.0000 mg | Freq: Once | INTRAMUSCULAR | Status: AC
  Administered 2023-12-06: 60 mg via INTRAMUSCULAR

## 2023-12-06 NOTE — Patient Instructions (Addendum)
Postprocedure instructions :    A Band-Aid should be left on for 12 hours. Injection therapy is not a cure itself. It is used in conjunction with other modalities. You can use nonsteroidal anti-inflammatories like ibuprofen , hot and cold compresses. Rest is recommended in the next 24 hours. You need to report immediately  if fever, chills or any signs of infection develop. 

## 2023-12-06 NOTE — Assessment & Plan Note (Signed)
Clonazepam prn  Potential benefits of a long term benzodiazepines  use as well as potential risks  and complications were explained to the patient and were aknowledged. Continue  Vraylar via assist program

## 2023-12-06 NOTE — Assessment & Plan Note (Signed)
 Acute on chronic pain Pt requested inj - see procedure X ray knee Knee brace

## 2023-12-06 NOTE — Assessment & Plan Note (Signed)
Continue with Lexapro,  Vraylar low dose

## 2023-12-06 NOTE — Assessment & Plan Note (Signed)
 Monitor TSH

## 2023-12-06 NOTE — Assessment & Plan Note (Signed)
 On B12

## 2023-12-06 NOTE — Assessment & Plan Note (Signed)
 Using a vibrating vest and a flutter valve.  Continue cough syrup with promethazine  as needed F/u w/Dr [employer]

## 2023-12-06 NOTE — Progress Notes (Signed)
 Subjective:  Patient ID: Veronica [name], female    DOB: 1950/04/23  Age: 73 y.o. MRN: [id number]  CC: Follow-up (Patient hurt her knee twice and wants to discuss this with Dr. Garald. Besides the knee she states she is doing well. )   HPI Veronica [name] presents for Follow-up. Patient hurt her R knee twice 1 week ago. The knee she states she is better. F/u on cough, thyroid  disorder, anxiety/depression.  Outpatient Medications Prior to Visit  Medication Sig Dispense Refill   acetaminophen -codeine  (TYLENOL  #3) 300-30 MG tablet TAKE 1 TABLET BY MOUTH EVERY 6 HOURS AS NEEDED 60 tablet 2   albuterol  (PROVENTIL ) (2.5 MG/3ML) 0.083% nebulizer solution USE 1 VIAL IN NEBULIZER 2 TIMES DAILY. Generic: VENTOLIN  180 mL 10   albuterol  (VENTOLIN  HFA) 108 (90 Base) MCG/ACT inhaler INHALE 2 PUFFS INTO THE LUNGS EVERY 4 HOURS AS NEEDED FOR WHEEZING OR SHORTNESS OF BREATH 6.7 g 5   arformoterol  (BROVANA ) 15 MCG/2ML NEBU Take 2 mLs (15 mcg total) by nebulization 2 (two) times daily. 120 mL 12   aspirin 81 MG chewable tablet Chew by mouth daily.     atorvastatin  (LIPITOR) 40 MG tablet TAKE 1 TABLET BY MOUTH EVERY DAY 90 tablet 1   budesonide  (PULMICORT ) 0.5 MG/2ML nebulizer solution Take 2 mLs (0.5 mg total) by nebulization 2 (two) times daily. 120 mL 12   calcium  carbonate (OSCAL) 1500 (600 Ca) MG TABS tablet Take 600 mg of elemental calcium  by mouth 2 (two) times daily with a meal.      cariprazine  (VRAYLAR ) 1.5 MG capsule Take 1 capsule (1.5 mg total) by mouth daily. [employer] @ [phone] Lot # [id number] Exp- [id number] 90 capsule 3   Cholecalciferol (VITAMIN D3) 50 MCG (2000 UT) capsule Take 1 capsule (2,000 Units total) by mouth daily. 100 capsule 3   clonazePAM  (KLONOPIN ) 1 MG tablet TAKE 2 TABLETS BY MOUTH EVERY NIGHT AT BEDTIME AND 1/2 TABLET EXTRA IN DAYTIME AS NEEDED 225 tablet 1   cyanocobalamin  (VITAMIN B12) 1000 MCG/ML injection INJECT 1 ML IN THE MUSCLE EVERY 14 DAYS 10 mL 5    diphenoxylate -atropine  (LOMOTIL ) 2.5-0.025 MG tablet Take 1 tablet by mouth 4 (four) times daily as needed for diarrhea or loose stools. 60 tablet 2   Docusate Calcium  (STOOL SOFTENER PO) Take 100 mg by mouth as needed (for constipation).     escitalopram  (LEXAPRO ) 20 MG tablet TAKE 1 TABLET(20 MG) BY MOUTH DAILY 90 tablet 3   ezetimibe  (ZETIA ) 10 MG tablet Take 1 tablet (10 mg total) by mouth daily. 90 tablet 3   furosemide  (LASIX ) 40 MG tablet Take 1 tablet (40 mg total) by mouth daily. 90 tablet 3   Insulin  Syringe-Needle U-100 (B-D INSULIN  SYRINGE 1CC/25GX1) 25G X 1 1 ML MISC USE AS DIRECTED EVERY 2 WEEKS 50 each 2   isosorbide  mononitrate (IMDUR ) 60 MG 24 hr tablet Take 1 tablet (60 mg total) by mouth daily. 90 tablet 3   ketoconazole  (NIZORAL ) 2 % cream Apply 1 Application topically 2 (two) times daily. 120 g 2   levothyroxine  (SYNTHROID ) 88 MCG tablet TAKE 1 TABLET(88 MCG) BY MOUTH DAILY 90 tablet 3   metFORMIN  (GLUCOPHAGE ) 500 MG tablet Take 1 tablet (500 mg total) by mouth daily with breakfast. 90 tablet 3   metoprolol  tartrate (LOPRESSOR ) 50 MG tablet TAKE 1 TABLET 2 HOURS PRIOR TO TEST 1 tablet 0   Multiple Vitamins-Minerals (PRESERVISION AREDS 2 PO) Take by mouth.     nitroGLYCERIN  (NITROSTAT )  0.4 MG SL tablet DISSOLVE 1 TABLET UNDER THE TONGUE AS DIRECTED. MAX 3 DOSES. CALL 911 IF NEEDING ALL 3 DOSESDISSOLVE 1 TABLET UNDER THE TONGUE AS DIRECTED. MAX 3 DOSES. CALL 911 IF NEEDING ALL 3 DOSES 20 tablet 3   nystatin  (MYCOSTATIN ) 100000 UNIT/ML suspension Take 5 mLs (500,000 Units total) by mouth 4 (four) times daily. 60 mL 0   Omega-3 Fatty Acids (FISH OIL) 1000 MG CAPS Take 1 capsule by mouth daily.     ondansetron  (ZOFRAN ) 4 MG tablet Take 1 tablet (4 mg total) by mouth every 8 (eight) hours as needed for nausea or vomiting. 30 tablet 2   phenytoin  (DILANTIN ) 100 MG ER capsule TAKE 2 CAPSULES BY MOUTH EVERY MORNING AND 1 CAPSULE BY MOUTH EVERY EVENING 270 capsule 3   phenytoin   (DILANTIN ) 100 MG ER capsule TAKE 2 CAPSULES BY MOUTH EVERY MORNING AND 1 CAPSULES EVERY EVENING 270 capsule 3   potassium chloride  SA (KLOR-CON  M) 20 MEQ tablet Take 1 tablet (20 mEq total) by mouth 2 (two) times daily. 180 tablet 3   promethazine  (PHENERGAN ) 12.5 MG tablet Take 1 tablet (12.5 mg total) by mouth every 4 (four) hours as needed for nausea or vomiting. 60 tablet 3   Respiratory Therapy Supplies (FLUTTER) DEVI Use after nebulization treatments 1 each 0   sodium chloride  (MURO 128) 5 % ophthalmic solution Place 1 drop into both eyes as needed for irritation.     SYRINGE-NEEDLE, DISP, 3 ML (BD ECLIPSE SYRINGE) 25G X 1 3 ML MISC Use sq q 2 wks 50 each 3   traZODone  (DESYREL ) 50 MG tablet TAKE 4 TABLETS(200 MG) BY MOUTH AT BEDTIME 360 tablet 3   traZODone  (DESYREL ) 50 MG tablet TAKE 4 TABLETS(200 MG) BY MOUTH AT BEDTIME 360 tablet 3   traZODone  (DESYREL ) 50 MG tablet TAKE 4 TABLETS(200 MG) BY MOUTH AT BEDTIME 360 tablet 3   verapamil  (CALAN -SR) 240 MG CR tablet TAKE 1 TABLET(240 MG) BY MOUTH AT BEDTIME 90 tablet 3   promethazine -dextromethorphan (PROMETHAZINE -DM) 6.25-15 MG/5ML syrup TAKE 5 ML BY MOUTH FOUR TIMES DAILY AS NEEDED FOR COUGH 720 mL 3   No facility-administered medications prior to visit.    ROS: Review of Systems  Constitutional:  Positive for fatigue. Negative for activity change, appetite change, chills and unexpected weight change.  HENT:  Negative for congestion, mouth sores and sinus pressure.   Eyes:  Negative for visual disturbance.  Respiratory:  Positive for cough, shortness of breath and wheezing. Negative for chest tightness.   Cardiovascular:  Negative for leg swelling.  Gastrointestinal:  Negative for abdominal pain and nausea.  Genitourinary:  Negative for difficulty urinating, frequency and vaginal pain.  Musculoskeletal:  Positive for arthralgias, gait problem and joint swelling. Negative for back pain.  Skin:  Negative for pallor and rash.   Neurological:  Negative for dizziness, tremors, weakness, numbness and headaches.  Psychiatric/Behavioral:  Negative for confusion, sleep disturbance and suicidal ideas.     Objective:  BP 110/80   Pulse 73   Temp 98.1 F (36.7 C) (Oral)   Ht 5' 2 (1.575 m)   Wt 149 lb (67.6 kg)   SpO2 94%   BMI 27.25 kg/m   BP Readings from Last 3 Encounters:  12/06/23 110/80  10/04/23 131/78  09/08/23 102/76    Wt Readings from Last 3 Encounters:  12/06/23 149 lb (67.6 kg)  10/04/23 146 lb (66.2 kg)  09/08/23 147 lb (66.7 kg)    Physical Exam Constitutional:  General: She is not in acute distress.    Appearance: She is well-developed.  HENT:     Head: Normocephalic.     Right Ear: External ear normal.     Left Ear: External ear normal.     Nose: Nose normal.  Eyes:     General:        Right eye: No discharge.        Left eye: No discharge.     Conjunctiva/sclera: Conjunctivae normal.     Pupils: Pupils are equal, round, and reactive to light.  Neck:     Thyroid : No thyromegaly.     Vascular: No JVD.     Trachea: No tracheal deviation.  Cardiovascular:     Rate and Rhythm: Normal rate and regular rhythm.     Heart sounds: Normal heart sounds.  Pulmonary:     Effort: No respiratory distress.     Breath sounds: No stridor. No wheezing.  Abdominal:     General: Bowel sounds are normal. There is no distension.     Palpations: Abdomen is soft. There is no mass.     Tenderness: There is no abdominal tenderness. There is no guarding or rebound.  Musculoskeletal:        General: Tenderness present.     Cervical back: Normal range of motion and neck supple. No rigidity.  Lymphadenopathy:     Cervical: No cervical adenopathy.  Skin:    Findings: No erythema or rash.  Neurological:     Cranial Nerves: No cranial nerve deficit.     Motor: No abnormal muscle tone.     Coordination: Coordination normal.     Gait: Gait abnormal.     Deep Tendon Reflexes: Reflexes normal.   Psychiatric:        Behavior: Behavior normal.        Thought Content: Thought content normal.        Judgment: Judgment normal.   R knee w/pain on range of motion.  Using a cane   Procedure Note :     Procedure : Joint Injection,  R knee   Indication:  Joint osteoarthritis with refractory  chronic pain.   Risks including unsuccessful procedure , bleeding, infection, bruising, skin atrophy, steroid flare-up and others were explained to the patient in detail as well as the benefits. Informed consent was obtained verbally.  Tthe patient was placed in a comfortable position. Lateral approach was used. Skin was prepped with Betadine and alcohol  and anesthetized a cooling spray. Then, a 5 cc syringe with a 1.5 inch long 25-gauge needle was used for a joint injection.. The needle was advanced  Into the knee joint cavity with some difficulty due to osteoarthritis. I aspirated a small amount of intra-articular fluid to confirm correct placement of the needle and injected the joint with 5 mL of 2% lidocaine  and 40 mg of Depo-Medrol  .  Band-Aid was applied.  Ace wrap applied   Tolerated well. Complications: None. Good pain relief following the procedure.    Lab Results  Component Value Date   WBC 7.1 04/14/2023   HGB 13.1 04/14/2023   HCT 39.9 04/14/2023   PLT 329.0 04/14/2023   GLUCOSE 93 12/05/2023   CHOL 142 01/25/2023   TRIG 142.0 01/25/2023   HDL 48.40 01/25/2023   LDLDIRECT 165.0 09/26/2009   LDLCALC 65 01/25/2023   ALT 21 12/05/2023   AST 32 12/05/2023   NA 138 12/05/2023   K 4.4 12/05/2023   CL 97 12/05/2023  CREATININE 0.88 12/05/2023   BUN 11 12/05/2023   CO2 33 (H) 12/05/2023   TSH 1.29 12/05/2023   INR 1.0 12/18/2021   HGBA1C 6.0 07/05/2023    MM 3D SCREENING MAMMOGRAM BILATERAL BREAST Result Date: 05/10/2023 CLINICAL DATA:  Screening. EXAM: DIGITAL SCREENING BILATERAL MAMMOGRAM WITH TOMOSYNTHESIS AND CAD TECHNIQUE: Bilateral screening digital craniocaudal and  mediolateral oblique mammograms were obtained. Bilateral screening digital breast tomosynthesis was performed. The images were evaluated with computer-aided detection. COMPARISON:  Previous exam(s). ACR Breast Density Category b: There are scattered areas of fibroglandular density. FINDINGS: There are no findings suspicious for malignancy. IMPRESSION: No mammographic evidence of malignancy. A result letter of this screening mammogram will be mailed directly to the patient. RECOMMENDATION: Screening mammogram in one year. (Code:SM-B-01Y) BI-RADS CATEGORY  1: Negative. Electronically Signed   By: Rosaline Collet M.D.   On: 05/10/2023 11:07    Assessment & Plan:   Problem List Items Addressed This Visit     Anxiety   Clonazepam  prn  Potential benefits of a long term benzodiazepines  use as well as potential risks  and complications were explained to the patient and were aknowledged. Continue  Vraylar  via assist program      Bronchiectasis (HCC)   Using a vibrating vest and a flutter valve.  Continue cough syrup with promethazine  as needed F/u w/Dr Hunsucker       Depression   Continue with Lexapro ,  Vraylar  low dose      Hypothyroidism   Monitor TSH      Knee pain, chronic - Primary   Acute on chronic pain Pt requested inj - see procedure X ray knee Knee brace      Relevant Orders   DG Knee 1-2 Views Right (Completed)   Vitamin B12 deficiency   On B12         Meds ordered this encounter  Medications   promethazine -dextromethorphan (PROMETHAZINE -DM) 6.25-15 MG/5ML syrup    Sig: TAKE 5 ML BY MOUTH FOUR TIMES DAILY AS NEEDED FOR COUGH    Dispense:  720 mL    Refill:  3   methylPREDNISolone  acetate (DEPO-MEDROL ) injection 40 mg   ketorolac  (TORADOL ) injection 60 mg      Follow-up: Return in about 3 months (around 03/06/2024) for a follow-up visit.  Marolyn Noel, MD

## 2023-12-06 NOTE — Telephone Encounter (Signed)
 Patient has a Dex scan appointment in October, patient showed me her form and she needs a physician order and reasonable diagnoses to have this performed. Please advise.

## 2023-12-07 ENCOUNTER — Ambulatory Visit: Payer: Self-pay | Admitting: Internal Medicine

## 2023-12-08 NOTE — Telephone Encounter (Signed)
 Diagnosis is osteoporosis, menopause.  Thanks

## 2023-12-13 ENCOUNTER — Other Ambulatory Visit: Payer: Medicare Other

## 2023-12-14 ENCOUNTER — Other Ambulatory Visit: Payer: Self-pay | Admitting: Internal Medicine

## 2023-12-15 ENCOUNTER — Other Ambulatory Visit

## 2023-12-19 ENCOUNTER — Ambulatory Visit: Payer: Medicare Other | Admitting: Psychology

## 2023-12-19 DIAGNOSIS — F331 Major depressive disorder, recurrent, moderate: Secondary | ICD-10-CM

## 2023-12-19 NOTE — Progress Notes (Signed)
 [facility] Counselor/Therapist Progress Note  Patient ID: Veronica [name], MRN: [id number],    Date: 12/19/2023  Time Spent: 12:00pm-12:45pm   45 minutes   Treatment Type: Individual Therapy  Reported Symptoms: stress, feeling down  Mental Status Exam: Appearance:  Casual     Behavior: Appropriate  Motor: Normal  Speech/Language:  Normal Rate  Affect: Appropriate  Mood: normal  Thought process: normal  Thought content:   WNL  Sensory/Perceptual disturbances:   WNL  Orientation: oriented to person, place, time/date, and situation  Attention: Good  Concentration: Good  Memory: WNL  Fund of knowledge:  Good  Insight:   Good  Judgment:  Good  Impulse Control: Good   Risk Assessment: Danger to Self:  No Self-injurious Behavior: No Danger to Others: No Duty to Warn:no Physical Aggression / Violence:No  Access to Firearms a concern: No  Gang Involvement:No   Subjective: Pt present for face-to-face individual therapy via video.  Pt consents to telehealth video session and is aware of limitations and benefits of virtual sessions.  Location of pt: home Location of therapist: home office.   Pt talked about her health.  Pt is having some knee pain since her fall.  She saw her doctor who gave her an injection in her knee.  Addressed pt's health worries.  Pt bought a walker for better stability and is planning to get a life line devise so she can access help when needed.  Pt has been working on organizing her medications.  She is feeling better about her medication organization system.   Pt has been putting pressure on herself for not getting things done.   Encouraged pt to prioritize pacing herself and resting and taking care of herself.  Pt talked about having some hard days when she thinks about life in general and how she can better herself.  Pt has trouble letting go of regrets in the past.  Worked on present moment mindfulness.  Worked on self care strategies.   Provided supportive therapy.  Interventions: Cognitive Behavioral Therapy and Insight-Oriented  Diagnosis: F33.1   Plan of Care: Recommend ongoing therapy.   Pt participated in setting therapy goals.  Pt wants to have someone to talk to and improve coping skills.   Plan to continue to meet every two weeks.  Pt agrees with treatment plan.  Treatment Plan (Treatment Plan Target Date: 07/03/2024) Client Abilities/Strengths  Pt is bright, engaging, and motivated for therapy.   Client Treatment Preferences  Individual therapy.  Client Statement of Needs  Improve coping skills.  Symptoms  Depressed or irritable mood. Lack of energy. Low self-esteem. Unresolved grief issues.   Problems Addressed  Unipolar Depression Goals 1. Alleviate depressive symptoms and return to previous level of effective functioning. 2. Appropriately grieve the loss in order to normalize mood and to return to previously adaptive level of functioning. Objective Learn and implement behavioral strategies to overcome depression. Target Date: 2024-07-03 Frequency: Biweekly  Progress: 55 Modality: individual  Related Interventions Engage the client in behavioral activation, increasing his/her activity level and contact with sources of reward, while identifying processes that inhibit activation.  Use behavioral techniques such as instruction, rehearsal, role-playing, role reversal, as needed, to facilitate activity in the client's daily life; reinforce success. Assist the client in developing skills that increase the likelihood of deriving pleasure from behavioral activation (e.g., assertiveness skills, developing an exercise plan, less internal/more external focus, increased social involvement); reinforce success. Objective Identify important people in life, past and present, and  describe the quality, good and poor, of those relationships. Target Date: 2024-07-03 Frequency: Biweekly  Progress: 55 Modality:  individual  Related Interventions Conduct Interpersonal Therapy beginning with the assessment of the client's interpersonal inventory of important past and present relationships; develop a case formulation linking depression to grief, interpersonal role disputes, role transitions, and/or interpersonal deficits). Objective Learn and implement problem-solving and decision-making skills. Target Date: 2024-07-03 Frequency: Biweekly  Progress: 55 Modality: individual  Related Interventions Conduct Problem-Solving Therapy using techniques such as psychoeducation, modeling, and role-playing to teach client problem-solving skills (i.e., defining a problem specifically, generating possible solutions, evaluating the pros and cons of each solution, selecting and implementing a plan of action, evaluating the efficacy of the plan, accepting or revising the plan); role-play application of the problem-solving skill to a real life issue. Encourage in the client the development of a positive problem orientation in which problems and solving them are viewed as a natural part of life and not something to be feared, despaired, or avoided. 3. Develop healthy interpersonal relationships that lead to the alleviation and help prevent the relapse of depression. 4. Develop healthy thinking patterns and beliefs about self, others, and the world that lead to the alleviation and help prevent the relapse of depression. 5. Recognize, accept, and cope with feelings of depression. Diagnosis F33.1  Conditions For Discharge Achievement of treatment goals and objectives   Veronica Alma, LCSW

## 2023-12-21 ENCOUNTER — Other Ambulatory Visit

## 2023-12-30 ENCOUNTER — Ambulatory Visit

## 2023-12-30 DIAGNOSIS — Z13820 Encounter for screening for osteoporosis: Secondary | ICD-10-CM | POA: Diagnosis not present

## 2023-12-30 DIAGNOSIS — M810 Age-related osteoporosis without current pathological fracture: Secondary | ICD-10-CM

## 2024-01-02 ENCOUNTER — Ambulatory Visit: Payer: Self-pay | Admitting: Internal Medicine

## 2024-01-02 ENCOUNTER — Ambulatory Visit: Payer: Medicare Other | Admitting: Psychology

## 2024-01-02 ENCOUNTER — Other Ambulatory Visit: Payer: Self-pay

## 2024-01-02 ENCOUNTER — Telehealth: Payer: Self-pay

## 2024-01-02 DIAGNOSIS — F329 Major depressive disorder, single episode, unspecified: Secondary | ICD-10-CM

## 2024-01-02 DIAGNOSIS — F332 Major depressive disorder, recurrent severe without psychotic features: Secondary | ICD-10-CM

## 2024-01-02 DIAGNOSIS — F331 Major depressive disorder, recurrent, moderate: Secondary | ICD-10-CM | POA: Diagnosis not present

## 2024-01-02 MED ORDER — CARIPRAZINE HCL 1.5 MG PO CAPS: 1.5000 mg | ORAL_CAPSULE | Freq: Every day | ORAL | 3 refills | Status: DC
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
## 2024-01-02 NOTE — Telephone Encounter (Signed)
 I was able to fax over the refill request for pts Vraylar  to [facility] per pts request.

## 2024-01-05 ENCOUNTER — Ambulatory Visit: Admitting: Internal Medicine

## 2024-01-05 ENCOUNTER — Encounter: Payer: Self-pay | Admitting: Internal Medicine

## 2024-01-05 VITALS — BP 136/84 | HR 77 | Temp 98.2°F | Ht 62.0 in | Wt 157.2 lb

## 2024-01-05 DIAGNOSIS — R609 Edema, unspecified: Secondary | ICD-10-CM

## 2024-01-05 DIAGNOSIS — E890 Postprocedural hypothyroidism: Secondary | ICD-10-CM

## 2024-01-05 DIAGNOSIS — J42 Unspecified chronic bronchitis: Secondary | ICD-10-CM | POA: Diagnosis not present

## 2024-01-05 MED ORDER — TORSEMIDE 20 MG PO TABS: 20.0000 mg | ORAL_TABLET | Freq: Every day | ORAL | 2 refills | Status: DC

## 2024-01-05 NOTE — Assessment & Plan Note (Signed)
 Using a vibrating vest and a flutter valve

## 2024-01-05 NOTE — Progress Notes (Signed)
 Subjective:  Patient ID: Veronica [name], female    DOB: 1951/02/25  Age: 73 y.o. MRN: [id number]  CC: Edema (Lower extremity edema for the past week. Has had some weight gain )   HPI [name] presents for edema (Lower extremity edema for the past week. Has had some weight gain - 8 lbs ). No CP, no SOB No salt diet...  Outpatient Medications Prior to Visit  Medication Sig Dispense Refill   acetaminophen -codeine  (TYLENOL  #3) 300-30 MG tablet TAKE 1 TABLET BY MOUTH EVERY 6 HOURS AS NEEDED 60 tablet 2   albuterol  (PROVENTIL ) (2.5 MG/3ML) 0.083% nebulizer solution USE 1 VIAL IN NEBULIZER 2 TIMES DAILY. Generic: VENTOLIN  180 mL 10   albuterol  (VENTOLIN  HFA) 108 (90 Base) MCG/ACT inhaler INHALE 2 PUFFS INTO THE LUNGS EVERY 4 HOURS AS NEEDED FOR WHEEZING OR SHORTNESS OF BREATH 6.7 g 5   arformoterol  (BROVANA ) 15 MCG/2ML NEBU Take 2 mLs (15 mcg total) by nebulization 2 (two) times daily. 120 mL 12   aspirin 81 MG chewable tablet Chew by mouth daily.     atorvastatin  (LIPITOR) 40 MG tablet TAKE 1 TABLET BY MOUTH EVERY DAY 90 tablet 1   budesonide  (PULMICORT ) 0.5 MG/2ML nebulizer solution Take 2 mLs (0.5 mg total) by nebulization 2 (two) times daily. 120 mL 12   calcium  carbonate (OSCAL) 1500 (600 Ca) MG TABS tablet Take 600 mg of elemental calcium  by mouth 2 (two) times daily with a meal.      cariprazine  (VRAYLAR ) 1.5 MG capsule Take 1 capsule (1.5 mg total) by mouth daily. [facility] @ [phone] Lot # [id number] Exp- [id number] 90 capsule 3   Cholecalciferol (VITAMIN D3) 50 MCG (2000 UT) capsule Take 1 capsule (2,000 Units total) by mouth daily. 100 capsule 3   clonazePAM  (KLONOPIN ) 1 MG tablet TAKE 2 TABLETS BY MOUTH EVERY NIGHT AT BEDTIME AND 1/2 TABLET EXTRA IN DAYTIME AS NEEDED 225 tablet 1   cyanocobalamin  (VITAMIN B12) 1000 MCG/ML injection INJECT 1 ML IN THE MUSCLE EVERY 14 DAYS 10 mL 5   diphenoxylate -atropine  (LOMOTIL ) 2.5-0.025 MG tablet Take 1 tablet by mouth 4 (four) times  daily as needed for diarrhea or loose stools. 60 tablet 2   Docusate Calcium  (STOOL SOFTENER PO) Take 100 mg by mouth as needed (for constipation).     escitalopram  (LEXAPRO ) 20 MG tablet TAKE 1 TABLET(20 MG) BY MOUTH DAILY 90 tablet 3   ezetimibe  (ZETIA ) 10 MG tablet Take 1 tablet (10 mg total) by mouth daily. 90 tablet 3   Insulin  Syringe-Needle U-100 (B-D INSULIN  SYRINGE 1CC/25GX1) 25G X 1 1 ML MISC USE AS DIRECTED EVERY 2 WEEKS 50 each 2   isosorbide  mononitrate (IMDUR ) 60 MG 24 hr tablet Take 1 tablet (60 mg total) by mouth daily. 90 tablet 3   ketoconazole  (NIZORAL ) 2 % cream Apply 1 Application topically 2 (two) times daily. 120 g 2   levothyroxine  (SYNTHROID ) 88 MCG tablet TAKE 1 TABLET(88 MCG) BY MOUTH DAILY 90 tablet 3   metFORMIN  (GLUCOPHAGE ) 500 MG tablet Take 1 tablet (500 mg total) by mouth daily with breakfast. 90 tablet 3   metoprolol  tartrate (LOPRESSOR ) 50 MG tablet TAKE 1 TABLET 2 HOURS PRIOR TO TEST 1 tablet 0   Multiple Vitamins-Minerals (PRESERVISION AREDS 2 PO) Take by mouth.     nitroGLYCERIN  (NITROSTAT ) 0.4 MG SL tablet DISSOLVE 1 TABLET UNDER THE TONGUE AS DIRECTED. MAX 3 DOSES. CALL 911 IF NEEDING ALL 3 DOSESDISSOLVE 1 TABLET UNDER THE TONGUE AS  DIRECTED. MAX 3 DOSES. CALL 911 IF NEEDING ALL 3 DOSES 20 tablet 3   nystatin  (MYCOSTATIN ) 100000 UNIT/ML suspension Take 5 mLs (500,000 Units total) by mouth 4 (four) times daily. 60 mL 0   Omega-3 Fatty Acids (FISH OIL) 1000 MG CAPS Take 1 capsule by mouth daily.     ondansetron  (ZOFRAN ) 4 MG tablet Take 1 tablet (4 mg total) by mouth every 8 (eight) hours as needed for nausea or vomiting. 30 tablet 2   phenytoin  (DILANTIN ) 100 MG ER capsule TAKE 2 CAPSULES BY MOUTH EVERY MORNING AND 1 CAPSULE BY MOUTH EVERY EVENING 270 capsule 3   phenytoin  (DILANTIN ) 100 MG ER capsule TAKE 2 CAPSULES BY MOUTH EVERY MORNING AND 1 CAPSULES EVERY EVENING 270 capsule 3   potassium chloride  SA (KLOR-CON  M) 20 MEQ tablet Take 1 tablet (20 mEq  total) by mouth 2 (two) times daily. 180 tablet 3   promethazine  (PHENERGAN ) 12.5 MG tablet TAKE 1 TABLET(12.5 MG) BY MOUTH EVERY 4 HOURS AS NEEDED FOR NAUSEA OR VOMITING 60 tablet 3   promethazine -dextromethorphan (PROMETHAZINE -DM) 6.25-15 MG/5ML syrup TAKE 5 ML BY MOUTH FOUR TIMES DAILY AS NEEDED FOR COUGH 720 mL 3   Respiratory Therapy Supplies (FLUTTER) DEVI Use after nebulization treatments 1 each 0   sodium chloride  (MURO 128) 5 % ophthalmic solution Place 1 drop into both eyes as needed for irritation.     SYRINGE-NEEDLE, DISP, 3 ML (BD ECLIPSE SYRINGE) 25G X 1 3 ML MISC Use sq q 2 wks 50 each 3   traZODone  (DESYREL ) 50 MG tablet TAKE 4 TABLETS(200 MG) BY MOUTH AT BEDTIME 360 tablet 3   traZODone  (DESYREL ) 50 MG tablet TAKE 4 TABLETS(200 MG) BY MOUTH AT BEDTIME 360 tablet 3   traZODone  (DESYREL ) 50 MG tablet TAKE 4 TABLETS(200 MG) BY MOUTH AT BEDTIME 360 tablet 3   verapamil  (CALAN -SR) 240 MG CR tablet TAKE 1 TABLET(240 MG) BY MOUTH AT BEDTIME 90 tablet 3   furosemide  (LASIX ) 40 MG tablet Take 1 tablet (40 mg total) by mouth daily. 90 tablet 3   No facility-administered medications prior to visit.    ROS: Review of Systems  Constitutional:  Negative for activity change, appetite change, chills, fatigue and unexpected weight change.  HENT:  Negative for congestion, mouth sores and sinus pressure.   Eyes:  Negative for visual disturbance.  Respiratory:  Negative for cough, chest tightness and shortness of breath.   Cardiovascular:  Positive for leg swelling. Negative for chest pain.  Gastrointestinal:  Positive for abdominal distention. Negative for abdominal pain and nausea.  Genitourinary:  Negative for difficulty urinating, frequency and vaginal pain.  Musculoskeletal:  Positive for arthralgias, back pain and gait problem.  Skin:  Negative for pallor and rash.  Neurological:  Negative for dizziness, tremors, weakness, numbness and headaches.  Hematological:  Bruises/bleeds easily.   Psychiatric/Behavioral:  Negative for confusion and sleep disturbance.     Objective:  BP 136/84   Pulse 77   Temp 98.2 F (36.8 C)   Ht 5' 2 (1.575 m)   Wt 157 lb 3.2 oz (71.3 kg)   SpO2 99%   BMI 28.75 kg/m   BP Readings from Last 3 Encounters:  01/05/24 136/84  12/06/23 110/80  10/04/23 131/78    Wt Readings from Last 3 Encounters:  01/05/24 157 lb 3.2 oz (71.3 kg)  12/06/23 149 lb (67.6 kg)  10/04/23 146 lb (66.2 kg)    Physical Exam Constitutional:      General: She is  not in acute distress.    Appearance: She is well-developed. She is obese.  HENT:     Head: Normocephalic.     Right Ear: External ear normal.     Left Ear: External ear normal.     Nose: Nose normal.  Eyes:     General:        Right eye: No discharge.        Left eye: No discharge.     Conjunctiva/sclera: Conjunctivae normal.     Pupils: Pupils are equal, round, and reactive to light.  Neck:     Thyroid : No thyromegaly.     Vascular: No JVD.     Trachea: No tracheal deviation.  Cardiovascular:     Rate and Rhythm: Normal rate and regular rhythm.     Heart sounds: Normal heart sounds.  Pulmonary:     Effort: No respiratory distress.     Breath sounds: No stridor. No wheezing.  Abdominal:     General: Bowel sounds are normal. There is no distension.     Palpations: Abdomen is soft. There is no mass.     Tenderness: There is no abdominal tenderness. There is no guarding or rebound.     Hernia: No hernia is present.  Musculoskeletal:        General: No tenderness.     Cervical back: Normal range of motion and neck supple. No rigidity.     Right lower leg: Edema present.     Left lower leg: Edema present.  Lymphadenopathy:     Cervical: No cervical adenopathy.  Skin:    Findings: No erythema or rash.  Neurological:     Mental Status: She is oriented to person, place, and time.     Cranial Nerves: No cranial nerve deficit.     Motor: No abnormal muscle tone.     Coordination:  Coordination normal.     Gait: Gait abnormal.     Deep Tendon Reflexes: Reflexes normal.  Psychiatric:        Behavior: Behavior normal.        Thought Content: Thought content normal.        Judgment: Judgment normal.   Using a cane 1+ edema B ankles   Lab Results  Component Value Date   WBC 7.1 04/14/2023   HGB 13.1 04/14/2023   HCT 39.9 04/14/2023   PLT 329.0 04/14/2023   GLUCOSE 93 12/05/2023   CHOL 142 01/25/2023   TRIG 142.0 01/25/2023   HDL 48.40 01/25/2023   LDLDIRECT 165.0 09/26/2009   LDLCALC 65 01/25/2023   ALT 21 12/05/2023   AST 32 12/05/2023   NA 138 12/05/2023   K 4.4 12/05/2023   CL 97 12/05/2023   CREATININE 0.88 12/05/2023   BUN 11 12/05/2023   CO2 33 (H) 12/05/2023   TSH 1.29 12/05/2023   INR 1.0 12/18/2021   HGBA1C 6.0 07/05/2023    MM 3D SCREENING MAMMOGRAM BILATERAL BREAST Result Date: 05/10/2023 CLINICAL DATA:  Screening. EXAM: DIGITAL SCREENING BILATERAL MAMMOGRAM WITH TOMOSYNTHESIS AND CAD TECHNIQUE: Bilateral screening digital craniocaudal and mediolateral oblique mammograms were obtained. Bilateral screening digital breast tomosynthesis was performed. The images were evaluated with computer-aided detection. COMPARISON:  Previous exam(s). ACR Breast Density Category b: There are scattered areas of fibroglandular density. FINDINGS: There are no findings suspicious for malignancy. IMPRESSION: No mammographic evidence of malignancy. A result letter of this screening mammogram will be mailed directly to the patient. RECOMMENDATION: Screening mammogram in one year. (Code:SM-B-01Y) BI-RADS CATEGORY  1: Negative. Electronically Signed   By: Rosaline Collet M.D.   On: 05/10/2023 11:07    Assessment & Plan:   Problem List Items Addressed This Visit     Chronic bronchitis (HCC)   Using a vibrating vest and a flutter valve      Edema - Primary   Worsening edema (Lower extremity edema for the past week. Has had some weight gain - 8 lbs ). No CP, no  SOB No salt diet... On Lasix  40 mg/d - not helping any longer No new cardiac sx's. TSH, GFR were ok ECHO 2021, CT angio reviewed  D/c Furosemide  Start Demadex RTC 2 wks Consider another ECHO We may need to get transvaginal pelvic US        Hypothyroidism   TSH was good         Meds ordered this encounter  Medications   torsemide (DEMADEX) 20 MG tablet    Sig: Take 1 tablet (20 mg total) by mouth daily.    Dispense:  180 tablet    Refill:  2      Follow-up: Return in about 2 weeks (around 01/19/2024) for a follow-up visit.  Marolyn Noel, MD

## 2024-01-05 NOTE — Assessment & Plan Note (Signed)
TSH was good

## 2024-01-05 NOTE — Assessment & Plan Note (Addendum)
 Worsening edema (Lower extremity edema for the past week. Has had some weight gain - 8 lbs ). No CP, no SOB No salt diet... On Lasix  40 mg/d - not helping any longer No new cardiac sx's. TSH, GFR were ok ECHO 2021, CT angio reviewed  D/c Furosemide  Start Demadex RTC 2 wks Consider another ECHO We may need to get transvaginal pelvic US 

## 2024-01-11 NOTE — Telephone Encounter (Unsigned)
 Copied from CRM [id number]. Topic: Clinical - Medication Question >> Jan 11, 2024  2:40 PM Anairis L wrote: Reason for CRM: Patient is requesting a call back from [facility], regarding cariprazine  (VRAYLAR ) 1.5 MG capsule   Wants to know when mediation will be in.

## 2024-01-12 ENCOUNTER — Encounter: Payer: Self-pay | Admitting: Pulmonary Disease

## 2024-01-12 ENCOUNTER — Ambulatory Visit

## 2024-01-12 ENCOUNTER — Ambulatory Visit: Admitting: Pulmonary Disease

## 2024-01-12 VITALS — BP 115/70 | HR 74 | Ht 62.0 in | Wt 149.0 lb

## 2024-01-12 DIAGNOSIS — R0609 Other forms of dyspnea: Secondary | ICD-10-CM

## 2024-01-12 DIAGNOSIS — J479 Bronchiectasis, uncomplicated: Secondary | ICD-10-CM

## 2024-01-12 DIAGNOSIS — J4489 Other specified chronic obstructive pulmonary disease: Secondary | ICD-10-CM | POA: Diagnosis not present

## 2024-01-12 NOTE — Patient Instructions (Signed)
 Nice to see you again  We get a chest x-ray today with some worsening shortness of breath especially with the recent fluid buildup in your legs  I encourage you to talk with Dr. Garald about seeing the cardiologist again especially with the fluid buildup and worsening shortness of breath  No changes in medication  Return to clinic in 3 months or sooner as needed with Dr. Annella
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
## 2024-01-12 NOTE — Telephone Encounter (Addendum)
 Spoke with [facility] @ [phone]...  Pts medication is now out for delivery to our office. Pt is aware of this information.  Lvm for pt with the above number.

## 2024-01-16 ENCOUNTER — Ambulatory Visit: Payer: Medicare Other | Admitting: Psychology

## 2024-01-16 DIAGNOSIS — F331 Major depressive disorder, recurrent, moderate: Secondary | ICD-10-CM | POA: Diagnosis not present

## 2024-01-16 NOTE — Progress Notes (Signed)
 [facility] Counselor/Therapist Progress Note  Patient ID: Veronica [name], MRN: [id number],    Date: 01/16/2024  Time Spent: 12:00pm-12:45pm   45 minutes   Treatment Type: Individual Therapy  Reported Symptoms: stress, feeling down  Mental Status Exam: Appearance:  Casual     Behavior: Appropriate  Motor: Normal  Speech/Language:  Normal Rate  Affect: Appropriate  Mood: normal  Thought process: normal  Thought content:   WNL  Sensory/Perceptual disturbances:   WNL  Orientation: oriented to person, place, time/date, and situation  Attention: Good  Concentration: Good  Memory: WNL  Fund of knowledge:  Good  Insight:   Good  Judgment:  Good  Impulse Control: Good   Risk Assessment: Danger to Self:  No Self-injurious Behavior: No Danger to Others: No Duty to Warn:no Physical Aggression / Violence:No  Access to Firearms a concern: No  Gang Involvement:No   Subjective: Pt present for face-to-face individual therapy via video.  Pt consents to telehealth video session and is aware of limitations and benefits of virtual sessions.  Location of pt: home Location of therapist: home office.   Pt talked about her health.  She saw her doctor who addressed her increased fluid issues.   Her doctor prescribed a new medication and pt has lost 10 lbs of fluid so she is feeling a little better but she states she is still feeling heavy and her hip is hurting.  Pt will see her doctor again this week.  Pt is worried that the fluid retention may be bc of heart and kidney issues.   Addressed pt's worries.  Pt has made dietary changes and does not eat salt.  She feels like she is having a lot of health issues.  She worries about her health but tries to take a day at a time.  Worked on thought reframing.  Pt has been putting pressure on herself for not getting things done.   Encouraged pt to prioritize pacing herself and resting and taking care of herself.  Pt talked about having  some hard days when she thinks about life in general.  Worked on present moment mindfulness.  Worked on self care strategies.  Provided supportive therapy.  Interventions: Cognitive Behavioral Therapy and Insight-Oriented  Diagnosis: F33.1   Plan of Care: Recommend ongoing therapy.   Pt participated in setting therapy goals.  Pt wants to have someone to talk to and improve coping skills.   Plan to continue to meet every two weeks.  Pt agrees with treatment plan.  Treatment Plan (Treatment Plan Target Date: 07/03/2024) Client Abilities/Strengths  Pt is bright, engaging, and motivated for therapy.   Client Treatment Preferences  Individual therapy.  Client Statement of Needs  Improve coping skills.  Symptoms  Depressed or irritable mood. Lack of energy. Low self-esteem. Unresolved grief issues.   Problems Addressed  Unipolar Depression Goals 1. Alleviate depressive symptoms and return to previous level of effective functioning. 2. Appropriately grieve the loss in order to normalize mood and to return to previously adaptive level of functioning. Objective Learn and implement behavioral strategies to overcome depression. Target Date: 2024-07-03 Frequency: Biweekly  Progress: 55 Modality: individual  Related Interventions Engage the client in behavioral activation, increasing his/her activity level and contact with sources of reward, while identifying processes that inhibit activation.  Use behavioral techniques such as instruction, rehearsal, role-playing, role reversal, as needed, to facilitate activity in the client's daily life; reinforce success. Assist the client in developing skills that increase the likelihood of  deriving pleasure from behavioral activation (e.g., assertiveness skills, developing an exercise plan, less internal/more external focus, increased social involvement); reinforce success. Objective Identify important people in life, past and present, and describe  the quality, good and poor, of those relationships. Target Date: 2024-07-03 Frequency: Biweekly  Progress: 55 Modality: individual  Related Interventions Conduct Interpersonal Therapy beginning with the assessment of the client's interpersonal inventory of important past and present relationships; develop a case formulation linking depression to grief, interpersonal role disputes, role transitions, and/or interpersonal deficits). Objective Learn and implement problem-solving and decision-making skills. Target Date: 2024-07-03 Frequency: Biweekly  Progress: 55 Modality: individual  Related Interventions Conduct Problem-Solving Therapy using techniques such as psychoeducation, modeling, and role-playing to teach client problem-solving skills (i.e., defining a problem specifically, generating possible solutions, evaluating the pros and cons of each solution, selecting and implementing a plan of action, evaluating the efficacy of the plan, accepting or revising the plan); role-play application of the problem-solving skill to a real life issue. Encourage in the client the development of a positive problem orientation in which problems and solving them are viewed as a natural part of life and not something to be feared, despaired, or avoided. 3. Develop healthy interpersonal relationships that lead to the alleviation and help prevent the relapse of depression. 4. Develop healthy thinking patterns and beliefs about self, others, and the world that lead to the alleviation and help prevent the relapse of depression. 5. Recognize, accept, and cope with feelings of depression. Diagnosis F33.1  Conditions For Discharge Achievement of treatment goals and objectives   [name], LCSW

## 2024-01-17 ENCOUNTER — Telehealth: Payer: Self-pay

## 2024-01-17 NOTE — Telephone Encounter (Signed)
 Copied from CRM [id number]. Topic: Clinical - Lab/Test Results >> Jan 17, 2024 12:21 PM Ismael A wrote: Reason for CRM: patient states she had a chest xray done on 10/30 and would like to have the results mailed to her and have someone reach out to explain the results   Dr. Annella can you advise of X-ray results?  I can mail them to the patient once you have reviewed.  Thanks!

## 2024-01-18 NOTE — Telephone Encounter (Signed)
 Called and spoke with patient relayed results,placed report in mail.NFN

## 2024-01-19 ENCOUNTER — Ambulatory Visit (INDEPENDENT_AMBULATORY_CARE_PROVIDER_SITE_OTHER): Admitting: Internal Medicine

## 2024-01-19 ENCOUNTER — Other Ambulatory Visit: Payer: Self-pay | Admitting: Internal Medicine

## 2024-01-19 ENCOUNTER — Encounter: Payer: Self-pay | Admitting: Internal Medicine

## 2024-01-19 VITALS — BP 144/82 | HR 72 | Temp 98.0°F | Ht 62.0 in | Wt 147.0 lb

## 2024-01-19 DIAGNOSIS — E785 Hyperlipidemia, unspecified: Secondary | ICD-10-CM

## 2024-01-19 DIAGNOSIS — M150 Primary generalized (osteo)arthritis: Secondary | ICD-10-CM

## 2024-01-19 DIAGNOSIS — I10 Essential (primary) hypertension: Secondary | ICD-10-CM | POA: Diagnosis not present

## 2024-01-19 DIAGNOSIS — E876 Hypokalemia: Secondary | ICD-10-CM | POA: Diagnosis not present

## 2024-01-19 DIAGNOSIS — R5383 Other fatigue: Secondary | ICD-10-CM

## 2024-01-19 DIAGNOSIS — J209 Acute bronchitis, unspecified: Secondary | ICD-10-CM

## 2024-01-19 DIAGNOSIS — J479 Bronchiectasis, uncomplicated: Secondary | ICD-10-CM | POA: Diagnosis not present

## 2024-01-19 LAB — URINALYSIS, ROUTINE W REFLEX MICROSCOPIC
Bilirubin Urine: NEGATIVE
Hgb urine dipstick: NEGATIVE
Ketones, ur: NEGATIVE
Nitrite: NEGATIVE
RBC / HPF: NONE SEEN (ref 0–?)
Specific Gravity, Urine: 1.01 (ref 1.000–1.030)
Total Protein, Urine: NEGATIVE
Urine Glucose: NEGATIVE
Urobilinogen, UA: 0.2 (ref 0.0–1.0)
pH: 6 (ref 5.0–8.0)

## 2024-01-19 LAB — COMPREHENSIVE METABOLIC PANEL WITH GFR
ALT: 15 U/L (ref 0–35)
AST: 24 U/L (ref 0–37)
Albumin: 4.7 g/dL (ref 3.5–5.2)
Alkaline Phosphatase: 130 U/L — ABNORMAL HIGH (ref 39–117)
BUN: 11 mg/dL (ref 6–23)
CO2: 35 meq/L — ABNORMAL HIGH (ref 19–32)
Calcium: 8.7 mg/dL (ref 8.4–10.5)
Chloride: 92 meq/L — ABNORMAL LOW (ref 96–112)
Creatinine, Ser: 0.93 mg/dL (ref 0.40–1.20)
GFR: 60.41 mL/min (ref >60.00)
Glucose, Bld: 68 mg/dL — ABNORMAL LOW (ref 70–99)
Potassium: 4.4 meq/L (ref 3.5–5.1)
Sodium: 139 meq/L (ref 135–145)
Total Bilirubin: 0.3 mg/dL (ref 0.2–1.2)
Total Protein: 8.1 g/dL (ref 6.0–8.3)

## 2024-01-19 LAB — CBC WITH DIFFERENTIAL/PLATELET
Basophils Absolute: 0 K/uL (ref 0.0–0.1)
Basophils Relative: 0.4 % (ref 0.0–3.0)
Eosinophils Absolute: 0.2 K/uL (ref 0.0–0.7)
Eosinophils Relative: 2 % (ref 0.0–5.0)
HCT: 42.7 % (ref 36.0–46.0)
Hemoglobin: 14.2 g/dL (ref 12.0–15.0)
Lymphocytes Relative: 36.8 % (ref 12.0–46.0)
Lymphs Abs: 4.6 K/uL — ABNORMAL HIGH (ref 0.7–4.0)
MCHC: 33.2 g/dL (ref 30.0–36.0)
MCV: 88.8 fl (ref 78.0–100.0)
Monocytes Absolute: 2 K/uL — ABNORMAL HIGH (ref 0.1–1.0)
Monocytes Relative: 15.9 % — ABNORMAL HIGH (ref 3.0–12.0)
Neutro Abs: 5.7 K/uL (ref 1.4–7.7)
Neutrophils Relative %: 44.9 % (ref 43.0–77.0)
Platelets: 388 K/uL (ref 150.0–400.0)
RBC: 4.81 Mil/uL (ref 3.87–5.11)
RDW: 13.9 % (ref 11.5–15.5)
WBC: 12.6 K/uL — ABNORMAL HIGH (ref 4.0–10.5)

## 2024-01-19 LAB — MAGNESIUM: Magnesium: 2.1 mg/dL (ref 1.5–2.5)

## 2024-01-19 LAB — CK: Total CK: 64 U/L (ref 17–177)

## 2024-01-19 LAB — T4, FREE: Free T4: 0.8 ng/dL (ref 0.60–1.60)

## 2024-01-19 MED ORDER — TORSEMIDE 20 MG PO TABS: 20.0000 mg | ORAL_TABLET | Freq: Every day | ORAL | Status: AC | PRN

## 2024-01-19 NOTE — Assessment & Plan Note (Signed)
 Using a vibrating vest and a flutter valve F/u w/Dr [employer]

## 2024-01-19 NOTE — Assessment & Plan Note (Signed)
Tylenol #3 prn  Potential benefits of a long term opioids use as well as potential risks (i.e. addiction risk, apnea etc) and complications (i.e. Somnolence, constipation and others) were explained to the patient and were aknowledged. Flexeril for TMJ

## 2024-01-19 NOTE — Assessment & Plan Note (Signed)
 Check CBC, CK, FT4

## 2024-01-19 NOTE — Patient Instructions (Signed)
 Blue-Emu cream -- use 2-3 times a day ? ?

## 2024-01-19 NOTE — Progress Notes (Signed)
 Subjective:  Patient ID: Veronica [name], female    DOB: 1951-01-09  Age: 73 y.o. MRN: [id number]  CC: Medical Management of Chronic Issues (2 Week follow up)   HPI [name] presents for edema - much better; weakness is the same. Pt lost 10 lbs according to home scale. F/u on HTN, bronchitis  Outpatient Medications Prior to Visit  Medication Sig Dispense Refill   acetaminophen -codeine  (TYLENOL  #3) 300-30 MG tablet TAKE 1 TABLET BY MOUTH EVERY 6 HOURS AS NEEDED 60 tablet 2   albuterol  (PROVENTIL ) (2.5 MG/3ML) 0.083% nebulizer solution USE 1 VIAL IN NEBULIZER 2 TIMES DAILY. Generic: VENTOLIN  180 mL 10   albuterol  (VENTOLIN  HFA) 108 (90 Base) MCG/ACT inhaler INHALE 2 PUFFS INTO THE LUNGS EVERY 4 HOURS AS NEEDED FOR WHEEZING OR SHORTNESS OF BREATH 6.7 g 5   arformoterol  (BROVANA ) 15 MCG/2ML NEBU Take 2 mLs (15 mcg total) by nebulization 2 (two) times daily. 120 mL 12   aspirin 81 MG chewable tablet Chew by mouth daily.     atorvastatin  (LIPITOR) 40 MG tablet TAKE 1 TABLET BY MOUTH EVERY DAY 90 tablet 1   budesonide  (PULMICORT ) 0.5 MG/2ML nebulizer solution Take 2 mLs (0.5 mg total) by nebulization 2 (two) times daily. 120 mL 12   calcium  carbonate (OSCAL) 1500 (600 Ca) MG TABS tablet Take 600 mg of elemental calcium  by mouth 2 (two) times daily with a meal.      cariprazine  (VRAYLAR ) 1.5 MG capsule Take 1 capsule (1.5 mg total) by mouth daily. [employer] @ [phone] Lot # [id number] Exp- [id number] 90 capsule 3   Cholecalciferol (VITAMIN D3) 50 MCG (2000 UT) capsule Take 1 capsule (2,000 Units total) by mouth daily. 100 capsule 3   clonazePAM  (KLONOPIN ) 1 MG tablet TAKE 2 TABLETS BY MOUTH EVERY NIGHT AT BEDTIME AND 1/2 TABLET EXTRA IN DAYTIME AS NEEDED 225 tablet 1   cyanocobalamin  (VITAMIN B12) 1000 MCG/ML injection INJECT 1 ML IN THE MUSCLE EVERY 14 DAYS 10 mL 5   diphenoxylate -atropine  (LOMOTIL ) 2.5-0.025 MG tablet Take 1 tablet by mouth 4 (four) times daily as needed for diarrhea or  loose stools. 60 tablet 2   Docusate Calcium  (STOOL SOFTENER PO) Take 100 mg by mouth as needed (for constipation).     escitalopram  (LEXAPRO ) 20 MG tablet TAKE 1 TABLET(20 MG) BY MOUTH DAILY 90 tablet 3   ezetimibe  (ZETIA ) 10 MG tablet Take 1 tablet (10 mg total) by mouth daily. 90 tablet 3   furosemide  (LASIX ) 40 MG tablet Take 40 mg by mouth daily. (Patient not taking: Reported on 01/12/2024)     Insulin  Syringe-Needle U-100 (B-D INSULIN  SYRINGE 1CC/25GX1) 25G X 1 1 ML MISC USE AS DIRECTED EVERY 2 WEEKS 50 each 2   isosorbide  mononitrate (IMDUR ) 60 MG 24 hr tablet Take 1 tablet (60 mg total) by mouth daily. 90 tablet 3   ketoconazole  (NIZORAL ) 2 % cream Apply 1 Application topically 2 (two) times daily. 120 g 2   levothyroxine  (SYNTHROID ) 88 MCG tablet TAKE 1 TABLET(88 MCG) BY MOUTH DAILY 90 tablet 3   metFORMIN  (GLUCOPHAGE ) 500 MG tablet Take 1 tablet (500 mg total) by mouth daily with breakfast. 90 tablet 3   metoprolol  tartrate (LOPRESSOR ) 50 MG tablet TAKE 1 TABLET 2 HOURS PRIOR TO TEST 1 tablet 0   Multiple Vitamins-Minerals (PRESERVISION AREDS 2 PO) Take by mouth.     nitroGLYCERIN  (NITROSTAT ) 0.4 MG SL tablet DISSOLVE 1 TABLET UNDER THE TONGUE AS DIRECTED. MAX 3 DOSES. CALL  911 IF NEEDING ALL 3 DOSESDISSOLVE 1 TABLET UNDER THE TONGUE AS DIRECTED. MAX 3 DOSES. CALL 911 IF NEEDING ALL 3 DOSES 20 tablet 3   nystatin  (MYCOSTATIN ) 100000 UNIT/ML suspension Take 5 mLs (500,000 Units total) by mouth 4 (four) times daily. 60 mL 0   Omega-3 Fatty Acids (FISH OIL) 1000 MG CAPS Take 1 capsule by mouth daily.     ondansetron  (ZOFRAN ) 4 MG tablet Take 1 tablet (4 mg total) by mouth every 8 (eight) hours as needed for nausea or vomiting. 30 tablet 2   phenytoin  (DILANTIN ) 100 MG ER capsule TAKE 2 CAPSULES BY MOUTH EVERY MORNING AND 1 CAPSULE BY MOUTH EVERY EVENING 270 capsule 3   phenytoin  (DILANTIN ) 100 MG ER capsule TAKE 2 CAPSULES BY MOUTH EVERY MORNING AND 1 CAPSULES EVERY EVENING 270 capsule 3    potassium chloride  SA (KLOR-CON  M) 20 MEQ tablet Take 1 tablet (20 mEq total) by mouth 2 (two) times daily. 180 tablet 3   promethazine  (PHENERGAN ) 12.5 MG tablet TAKE 1 TABLET(12.5 MG) BY MOUTH EVERY 4 HOURS AS NEEDED FOR NAUSEA OR VOMITING 60 tablet 3   promethazine -dextromethorphan (PROMETHAZINE -DM) 6.25-15 MG/5ML syrup TAKE 5 ML BY MOUTH FOUR TIMES DAILY AS NEEDED FOR COUGH 720 mL 3   Respiratory Therapy Supplies (FLUTTER) DEVI Use after nebulization treatments 1 each 0   sodium chloride  (MURO 128) 5 % ophthalmic solution Place 1 drop into both eyes as needed for irritation.     SYRINGE-NEEDLE, DISP, 3 ML (BD ECLIPSE SYRINGE) 25G X 1 3 ML MISC Use sq q 2 wks 50 each 3   traZODone  (DESYREL ) 50 MG tablet TAKE 4 TABLETS(200 MG) BY MOUTH AT BEDTIME 360 tablet 3   traZODone  (DESYREL ) 50 MG tablet TAKE 4 TABLETS(200 MG) BY MOUTH AT BEDTIME 360 tablet 3   traZODone  (DESYREL ) 50 MG tablet TAKE 4 TABLETS(200 MG) BY MOUTH AT BEDTIME 360 tablet 3   verapamil  (CALAN -SR) 240 MG CR tablet TAKE 1 TABLET(240 MG) BY MOUTH AT BEDTIME 90 tablet 3   torsemide (DEMADEX) 20 MG tablet Take 1 tablet (20 mg total) by mouth daily. 180 tablet 2   No facility-administered medications prior to visit.    ROS: Review of Systems  Constitutional:  Positive for fatigue. Negative for activity change, appetite change, chills and unexpected weight change.  HENT:  Negative for congestion, mouth sores and sinus pressure.   Eyes:  Negative for visual disturbance.  Respiratory:  Positive for cough. Negative for chest tightness and wheezing.   Gastrointestinal:  Negative for abdominal pain and nausea.  Genitourinary:  Negative for difficulty urinating, frequency and vaginal pain.  Musculoskeletal:  Negative for back pain and gait problem.  Skin:  Negative for pallor and rash.  Neurological:  Negative for dizziness, tremors, weakness, numbness and headaches.  Psychiatric/Behavioral:  Negative for confusion, sleep disturbance  and suicidal ideas.     Objective:  BP (!) 144/82   Pulse 72   Temp 98 F (36.7 C)   Ht 5' 2 (1.575 m)   Wt 147 lb (66.7 kg)   SpO2 99%   BMI 26.89 kg/m   BP Readings from Last 3 Encounters:  01/19/24 (!) 144/82  01/12/24 115/70  01/05/24 136/84    Wt Readings from Last 3 Encounters:  01/19/24 147 lb (66.7 kg)  01/12/24 149 lb (67.6 kg)  01/05/24 157 lb 3.2 oz (71.3 kg)    Physical Exam Constitutional:      General: She is not in acute distress.  Appearance: Normal appearance. She is well-developed.  HENT:     Head: Normocephalic.     Right Ear: External ear normal.     Left Ear: External ear normal.     Nose: Nose normal.  Eyes:     General:        Right eye: No discharge.        Left eye: No discharge.     Conjunctiva/sclera: Conjunctivae normal.     Pupils: Pupils are equal, round, and reactive to light.  Neck:     Thyroid : No thyromegaly.     Vascular: No JVD.     Trachea: No tracheal deviation.  Cardiovascular:     Rate and Rhythm: Normal rate and regular rhythm.     Heart sounds: Normal heart sounds.  Pulmonary:     Effort: No respiratory distress.     Breath sounds: No stridor. No wheezing.  Abdominal:     General: Bowel sounds are normal. There is no distension.     Palpations: Abdomen is soft. There is no mass.     Tenderness: There is no abdominal tenderness. There is no guarding or rebound.  Musculoskeletal:        General: No tenderness.     Cervical back: Normal range of motion and neck supple. No rigidity.     Right lower leg: No edema.     Left lower leg: No edema.  Lymphadenopathy:     Cervical: No cervical adenopathy.  Skin:    Findings: No erythema or rash.  Neurological:     Cranial Nerves: No cranial nerve deficit.     Motor: No abnormal muscle tone.     Coordination: Coordination normal.     Deep Tendon Reflexes: Reflexes normal.  Psychiatric:        Behavior: Behavior normal.        Thought Content: Thought content  normal.        Judgment: Judgment normal.     Lab Results  Component Value Date   WBC 7.1 04/14/2023   HGB 13.1 04/14/2023   HCT 39.9 04/14/2023   PLT 329.0 04/14/2023   GLUCOSE 93 12/05/2023   CHOL 142 01/25/2023   TRIG 142.0 01/25/2023   HDL 48.40 01/25/2023   LDLDIRECT 165.0 09/26/2009   LDLCALC 65 01/25/2023   ALT 21 12/05/2023   AST 32 12/05/2023   NA 138 12/05/2023   K 4.4 12/05/2023   CL 97 12/05/2023   CREATININE 0.88 12/05/2023   BUN 11 12/05/2023   CO2 33 (H) 12/05/2023   TSH 1.29 12/05/2023   INR 1.0 12/18/2021   HGBA1C 6.0 07/05/2023    MM 3D SCREENING MAMMOGRAM BILATERAL BREAST Result Date: 05/10/2023 CLINICAL DATA:  Screening. EXAM: DIGITAL SCREENING BILATERAL MAMMOGRAM WITH TOMOSYNTHESIS AND CAD TECHNIQUE: Bilateral screening digital craniocaudal and mediolateral oblique mammograms were obtained. Bilateral screening digital breast tomosynthesis was performed. The images were evaluated with computer-aided detection. COMPARISON:  Previous exam(s). ACR Breast Density Category b: There are scattered areas of fibroglandular density. FINDINGS: There are no findings suspicious for malignancy. IMPRESSION: No mammographic evidence of malignancy. A result letter of this screening mammogram will be mailed directly to the patient. RECOMMENDATION: Screening mammogram in one year. (Code:SM-B-01Y) BI-RADS CATEGORY  1: Negative. Electronically Signed   By: Rosaline Collet M.D.   On: 05/10/2023 11:07    Assessment & Plan:   Problem List Items Addressed This Visit     Acute bronchitis   F/u w/Dr [facility]  Relevant Orders   CBC with Differential/Platelet   Urinalysis   Bronchiectasis (HCC) - Primary   Using a vibrating vest and a flutter valve F/u w/Dr Hunsucker      Relevant Orders   Comprehensive metabolic panel with GFR   CBC with Differential/Platelet   Magnesium   T4, free   Urinalysis   Dyslipidemia   Relevant Orders   T4, free   Essential  hypertension   Relevant Medications   torsemide (DEMADEX) 20 MG tablet   Fatigue   Check CBC, CK, FT4      Hypokalemia   Relevant Orders   Comprehensive metabolic panel with GFR   Magnesium   CK   Osteoarthritis   Tylenol  #3 prn  Potential benefits of a long term opioids use as well as potential risks (i.e. addiction risk, apnea etc) and complications (i.e. Somnolence, constipation and others) were explained to the patient and were aknowledged. Flexeril  for TMJ         Meds ordered this encounter  Medications   torsemide (DEMADEX) 20 MG tablet    Sig: Take 1 tablet (20 mg total) by mouth daily as needed.      Follow-up: Return for a follow-up visit.  Marolyn Noel, MD

## 2024-01-19 NOTE — Assessment & Plan Note (Signed)
 F/u w/Dr [employer]

## 2024-01-20 ENCOUNTER — Ambulatory Visit: Payer: Self-pay | Admitting: Internal Medicine

## 2024-01-25 ENCOUNTER — Telehealth: Payer: Self-pay

## 2024-01-25 NOTE — Telephone Encounter (Signed)
Patient would like for you to call her back.  °

## 2024-01-25 NOTE — Telephone Encounter (Signed)
 I was able to speak with the pt informing her that her medication is in office and ready for pick up. Pt has stated understanding.

## 2024-01-30 ENCOUNTER — Ambulatory Visit: Payer: Medicare Other | Admitting: Psychology

## 2024-01-30 DIAGNOSIS — F331 Major depressive disorder, recurrent, moderate: Secondary | ICD-10-CM | POA: Diagnosis not present

## 2024-01-30 NOTE — Progress Notes (Signed)
 [facility] Counselor/Therapist Progress Note  Patient ID: Veronica [name], MRN: [id number],    Date: 01/30/2024  Time Spent: 12:00pm-12:45pm   45 minutes   Treatment Type: Individual Therapy  Reported Symptoms: stress, feeling down  Mental Status Exam: Appearance:  Casual     Behavior: Appropriate  Motor: Normal  Speech/Language:  Normal Rate  Affect: Appropriate  Mood: normal  Thought process: normal  Thought content:   WNL  Sensory/Perceptual disturbances:   WNL  Orientation: oriented to person, place, time/date, and situation  Attention: Good  Concentration: Good  Memory: WNL  Fund of knowledge:  Good  Insight:   Good  Judgment:  Good  Impulse Control: Good   Risk Assessment: Danger to Self:  No Self-injurious Behavior: No Danger to Others: No Duty to Warn:no Physical Aggression / Violence:No  Access to Firearms a concern: No  Gang Involvement:No   Subjective: Pt present for face-to-face individual therapy via video.  Pt consents to telehealth video session and is aware of limitations and benefits of virtual sessions.  Location of pt: home Location of therapist: home office.   Pt talked about her health.  She has to wear a knee brace now to address her knee pain.  This has slowed pt down bc she needs to rest her knee.   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  Pt will have to increase her medication.   Pt is worried about her health.  Addressed pt's worries.  Pt talked about the holidays.  She will be alone and the holidays are a difficult time for pt.   Helped pt process her thoughts and feelings. Addressed how pt can connect with family by phone.  Pt has been putting pressure on herself for not getting things done.   Encouraged pt to prioritize pacing herself and resting and taking care of herself.  Pt talked about having some hard days when she thinks about life in general.  Worked on present moment mindfulness.   Worked on self care strategies.  Provided supportive therapy.  Interventions: Cognitive Behavioral Therapy and Insight-Oriented  Diagnosis: F33.1   Plan of Care: Recommend ongoing therapy.   Pt participated in setting therapy goals.  Pt wants to have someone to talk to and improve coping skills.   Plan to continue to meet every two weeks.  Pt agrees with treatment plan.  Treatment Plan (Treatment Plan Target Date: 07/03/2024) Client Abilities/Strengths  Pt is bright, engaging, and motivated for therapy.   Client Treatment Preferences  Individual therapy.  Client Statement of Needs  Improve coping skills.  Symptoms  Depressed or irritable mood. Lack of energy. Low self-esteem. Unresolved grief issues.   Problems Addressed  Unipolar Depression Goals 1. Alleviate depressive symptoms and return to previous level of effective functioning. 2. Appropriately grieve the loss in order to normalize mood and to return to previously adaptive level of functioning. Objective Learn and implement behavioral strategies to overcome depression. Target Date: 2024-07-03 Frequency: Biweekly  Progress: 55 Modality: individual  Related Interventions Engage the client in behavioral activation, increasing his/her activity level and contact with sources of reward, while identifying processes that inhibit activation.  Use behavioral techniques such as instruction, rehearsal, role-playing, role reversal, as needed, to facilitate activity in the client's daily life; reinforce success. Assist the client in developing skills that increase the likelihood of deriving pleasure from behavioral activation (e.g., assertiveness skills, developing an exercise plan, less internal/more external focus, increased social involvement); reinforce success. Objective  Identify important people in life, past and present, and describe the quality, good and poor, of those relationships. Target Date: 2024-07-03 Frequency:  Biweekly  Progress: 55 Modality: individual  Related Interventions Conduct Interpersonal Therapy beginning with the assessment of the client's interpersonal inventory of important past and present relationships; develop a case formulation linking depression to grief, interpersonal role disputes, role transitions, and/or interpersonal deficits). Objective Learn and implement problem-solving and decision-making skills. Target Date: 2024-07-03 Frequency: Biweekly  Progress: 55 Modality: individual  Related Interventions Conduct Problem-Solving Therapy using techniques such as psychoeducation, modeling, and role-playing to teach client problem-solving skills (i.e., defining a problem specifically, generating possible solutions, evaluating the pros and cons of each solution, selecting and implementing a plan of action, evaluating the efficacy of the plan, accepting or revising the plan); role-play application of the problem-solving skill to a real life issue. Encourage in the client the development of a positive problem orientation in which problems and solving them are viewed as a natural part of life and not something to be feared, despaired, or avoided. 3. Develop healthy interpersonal relationships that lead to the alleviation and help prevent the relapse of depression. 4. Develop healthy thinking patterns and beliefs about self, others, and the world that lead to the alleviation and help prevent the relapse of depression. 5. Recognize, accept, and cope with feelings of depression. Diagnosis F33.1  Conditions For Discharge Achievement of treatment goals and objectives   [name], LCSW

## 2024-02-06 ENCOUNTER — Other Ambulatory Visit: Payer: Self-pay | Admitting: Internal Medicine

## 2024-02-13 ENCOUNTER — Ambulatory Visit: Payer: Medicare Other | Admitting: Psychology

## 2024-02-13 DIAGNOSIS — F331 Major depressive disorder, recurrent, moderate: Secondary | ICD-10-CM

## 2024-02-13 NOTE — Progress Notes (Signed)
 [facility] Counselor/Therapist Progress Note  Patient ID: Veronica [name], MRN: [id number],    Date: 02/13/2024  Time Spent: 12:00pm-12:45pm   45 minutes   Treatment Type: Individual Therapy  Reported Symptoms: stress, feeling down  Mental Status Exam: Appearance:  Casual     Behavior: Appropriate  Motor: Normal  Speech/Language:  Normal Rate  Affect: Appropriate  Mood: normal  Thought process: normal  Thought content:   WNL  Sensory/Perceptual disturbances:   WNL  Orientation: oriented to person, place, time/date, and situation  Attention: Good  Concentration: Good  Memory: WNL  Fund of knowledge:  Good  Insight:   Good  Judgment:  Good  Impulse Control: Good   Risk Assessment: Danger to Self:  No Self-injurious Behavior: No Danger to Others: No Duty to Warn:no Physical Aggression / Violence:No  Access to Firearms a concern: No  Gang Involvement:No   Subjective: Pt present for face-to-face individual therapy via video.  Pt consents to telehealth video session and is aware of limitations and benefits of virtual sessions.  Location of pt: home Location of therapist: home office.   Pt talked about her health.  She has not been feeling well.  She sees her PCP this week.  Pt is worried about her health.  Addressed pt's worries.  Pt talked about Thanksgiving.  She was alone but her brother brought her some Thanksgiving dinner.   Pt has been putting pressure on herself for not getting things done.   Encouraged pt to prioritize pacing herself and resting and taking care of herself.  Pt talked about having some hard days when she thinks about life in general.     Helped pt process her thoughts and feelings.   Worked on present moment mindfulness.  Worked on self care strategies.  Pt works on word search puzzles and tries to take one day at a time.   Pt has also been charitable to a homeless couple.  Provided supportive therapy.  Interventions: Cognitive  Behavioral Therapy and Insight-Oriented  Diagnosis: F33.1   Plan of Care: Recommend ongoing therapy.   Pt participated in setting therapy goals.  Pt wants to have someone to talk to and improve coping skills.   Plan to continue to meet every two weeks.  Pt agrees with treatment plan.  Treatment Plan (Treatment Plan Target Date: 07/03/2024) Client Abilities/Strengths  Pt is bright, engaging, and motivated for therapy.   Client Treatment Preferences  Individual therapy.  Client Statement of Needs  Improve coping skills.  Symptoms  Depressed or irritable mood. Lack of energy. Low self-esteem. Unresolved grief issues.   Problems Addressed  Unipolar Depression Goals 1. Alleviate depressive symptoms and return to previous level of effective functioning. 2. Appropriately grieve the loss in order to normalize mood and to return to previously adaptive level of functioning. Objective Learn and implement behavioral strategies to overcome depression. Target Date: 2024-07-03 Frequency: Biweekly  Progress: 55 Modality: individual  Related Interventions Engage the client in behavioral activation, increasing his/her activity level and contact with sources of reward, while identifying processes that inhibit activation.  Use behavioral techniques such as instruction, rehearsal, role-playing, role reversal, as needed, to facilitate activity in the client's daily life; reinforce success. Assist the client in developing skills that increase the likelihood of deriving pleasure from behavioral activation (e.g., assertiveness skills, developing an exercise plan, less internal/more external focus, increased social involvement); reinforce success. Objective Identify important people in life, past and present, and describe the quality, good and poor, of  those relationships. Target Date: 2024-07-03 Frequency: Biweekly  Progress: 55 Modality: individual  Related Interventions Conduct Interpersonal Therapy  beginning with the assessment of the client's interpersonal inventory of important past and present relationships; develop a case formulation linking depression to grief, interpersonal role disputes, role transitions, and/or interpersonal deficits). Objective Learn and implement problem-solving and decision-making skills. Target Date: 2024-07-03 Frequency: Biweekly  Progress: 55 Modality: individual  Related Interventions Conduct Problem-Solving Therapy using techniques such as psychoeducation, modeling, and role-playing to teach client problem-solving skills (i.e., defining a problem specifically, generating possible solutions, evaluating the pros and cons of each solution, selecting and implementing a plan of action, evaluating the efficacy of the plan, accepting or revising the plan); role-play application of the problem-solving skill to a real life issue. Encourage in the client the development of a positive problem orientation in which problems and solving them are viewed as a natural part of life and not something to be feared, despaired, or avoided. 3. Develop healthy interpersonal relationships that lead to the alleviation and help prevent the relapse of depression. 4. Develop healthy thinking patterns and beliefs about self, others, and the world that lead to the alleviation and help prevent the relapse of depression. 5. Recognize, accept, and cope with feelings of depression. Diagnosis F33.1  Conditions For Discharge Achievement of treatment goals and objectives   [name], LCSW

## 2024-02-16 ENCOUNTER — Ambulatory Visit: Admitting: Internal Medicine

## 2024-02-16 ENCOUNTER — Encounter: Payer: Self-pay | Admitting: Internal Medicine

## 2024-02-16 VITALS — BP 124/88 | HR 73 | Temp 98.3°F | Ht 62.0 in | Wt 151.6 lb

## 2024-02-16 DIAGNOSIS — J4489 Other specified chronic obstructive pulmonary disease: Secondary | ICD-10-CM | POA: Diagnosis not present

## 2024-02-16 DIAGNOSIS — J471 Bronchiectasis with (acute) exacerbation: Secondary | ICD-10-CM | POA: Diagnosis not present

## 2024-02-16 DIAGNOSIS — R053 Chronic cough: Secondary | ICD-10-CM | POA: Diagnosis not present

## 2024-02-16 MED ORDER — METHYLPREDNISOLONE ACETATE 80 MG/ML IJ SUSP
80.0000 mg | Freq: Once | INTRAMUSCULAR | Status: AC
  Administered 2024-02-16: 80 mg via INTRAMUSCULAR

## 2024-02-16 MED ORDER — LEVOFLOXACIN 500 MG PO TABS: 500.0000 mg | ORAL_TABLET | Freq: Every day | ORAL | 0 refills | Status: AC

## 2024-02-16 MED ORDER — LEVOTHYROXINE SODIUM 88 MCG PO TABS: ORAL_TABLET | ORAL | 3 refills | Status: AC

## 2024-02-16 NOTE — Progress Notes (Signed)
 Subjective:  Patient ID: Veronica [name], female    DOB: Oct 15, 1950  Age: 73 y.o. MRN: [id number]  CC: Medical Management of Chronic Issues (3 Month follow up)   HPI Veronica [name] presents for URI sx's x 3-4 d  F/u on chronic cough, B12, seizure disorder, bronchiectases   Outpatient Medications Prior to Visit  Medication Sig Dispense Refill   acetaminophen -codeine  (TYLENOL  #3) 300-30 MG tablet TAKE 1 TABLET BY MOUTH EVERY 6 HOURS AS NEEDED 60 tablet 3   albuterol  (PROVENTIL ) (2.5 MG/3ML) 0.083% nebulizer solution USE 1 VIAL IN NEBULIZER 2 TIMES DAILY. Generic: VENTOLIN  180 mL 10   albuterol  (VENTOLIN  HFA) 108 (90 Base) MCG/ACT inhaler INHALE 2 PUFFS INTO THE LUNGS EVERY 4 HOURS AS NEEDED FOR WHEEZING OR SHORTNESS OF BREATH 6.7 g 5   arformoterol  (BROVANA ) 15 MCG/2ML NEBU Take 2 mLs (15 mcg total) by nebulization 2 (two) times daily. 120 mL 12   aspirin 81 MG chewable tablet Chew by mouth daily.     atorvastatin  (LIPITOR) 40 MG tablet TAKE 1 TABLET BY MOUTH EVERY DAY 90 tablet 1   budesonide  (PULMICORT ) 0.5 MG/2ML nebulizer solution Take 2 mLs (0.5 mg total) by nebulization 2 (two) times daily. 120 mL 12   calcium  carbonate (OSCAL) 1500 (600 Ca) MG TABS tablet Take 600 mg of elemental calcium  by mouth 2 (two) times daily with a meal.      cariprazine  (VRAYLAR ) 1.5 MG capsule Take 1 capsule (1.5 mg total) by mouth daily. [employer] @ [phone] Lot # [id number] Exp- [id number] 90 capsule 3   Cholecalciferol (VITAMIN D3) 50 MCG (2000 UT) capsule Take 1 capsule (2,000 Units total) by mouth daily. 100 capsule 3   clonazePAM  (KLONOPIN ) 1 MG tablet TAKE 2 TABLETS BY MOUTH EVERY NIGHT AT BEDTIME AND 1/2 TABLET EXTRA IN DAYTIME AS NEEDED 225 tablet 1   cyanocobalamin  (VITAMIN B12) 1000 MCG/ML injection INJECT 1 ML IN THE MUSCLE EVERY 14 DAYS 10 mL 5   diphenoxylate -atropine  (LOMOTIL ) 2.5-0.025 MG tablet Take 1 tablet by mouth 4 (four) times daily as needed for diarrhea or loose stools. 60  tablet 2   Docusate Calcium  (STOOL SOFTENER PO) Take 100 mg by mouth as needed (for constipation).     escitalopram  (LEXAPRO ) 20 MG tablet TAKE 1 TABLET(20 MG) BY MOUTH DAILY 90 tablet 3   ezetimibe  (ZETIA ) 10 MG tablet Take 1 tablet (10 mg total) by mouth daily. 90 tablet 3   Insulin  Syringe-Needle U-100 (B-D INSULIN  SYRINGE 1CC/25GX1) 25G X 1 1 ML MISC USE AS DIRECTED EVERY 2 WEEKS 50 each 2   isosorbide  mononitrate (IMDUR ) 60 MG 24 hr tablet Take 1 tablet (60 mg total) by mouth daily. 90 tablet 3   ketoconazole  (NIZORAL ) 2 % cream Apply 1 Application topically 2 (two) times daily. 120 g 2   metFORMIN  (GLUCOPHAGE ) 500 MG tablet TAKE 1 TABLET(500 MG) BY MOUTH DAILY WITH BREAKFAST 90 tablet 3   metoprolol  tartrate (LOPRESSOR ) 50 MG tablet TAKE 1 TABLET 2 HOURS PRIOR TO TEST 1 tablet 0   Multiple Vitamins-Minerals (PRESERVISION AREDS 2 PO) Take by mouth.     nitroGLYCERIN  (NITROSTAT ) 0.4 MG SL tablet DISSOLVE 1 TABLET UNDER THE TONGUE AS DIRECTED. MAX 3 DOSES. CALL 911 IF NEEDING ALL 3 DOSESDISSOLVE 1 TABLET UNDER THE TONGUE AS DIRECTED. MAX 3 DOSES. CALL 911 IF NEEDING ALL 3 DOSES 20 tablet 3   nystatin  (MYCOSTATIN ) 100000 UNIT/ML suspension Take 5 mLs (500,000 Units total) by mouth 4 (four) times daily.  60 mL 0   Omega-3 Fatty Acids (FISH OIL) 1000 MG CAPS Take 1 capsule by mouth daily.     ondansetron  (ZOFRAN ) 4 MG tablet Take 1 tablet (4 mg total) by mouth every 8 (eight) hours as needed for nausea or vomiting. 30 tablet 2   phenytoin  (DILANTIN ) 100 MG ER capsule TAKE 2 CAPSULES BY MOUTH EVERY MORNING AND 1 CAPSULE BY MOUTH EVERY EVENING 270 capsule 3   phenytoin  (DILANTIN ) 100 MG ER capsule TAKE 2 CAPSULES BY MOUTH EVERY MORNING AND 1 CAPSULE EVERY EVENING 270 capsule 3   potassium chloride  SA (KLOR-CON  M) 20 MEQ tablet Take 1 tablet (20 mEq total) by mouth 2 (two) times daily. 180 tablet 3   promethazine  (PHENERGAN ) 12.5 MG tablet TAKE 1 TABLET(12.5 MG) BY MOUTH EVERY 4 HOURS AS NEEDED FOR  NAUSEA OR VOMITING 60 tablet 3   promethazine -dextromethorphan (PROMETHAZINE -DM) 6.25-15 MG/5ML syrup TAKE 5 ML BY MOUTH FOUR TIMES DAILY AS NEEDED FOR COUGH 720 mL 3   Respiratory Therapy Supplies (FLUTTER) DEVI Use after nebulization treatments 1 each 0   sodium chloride  (MURO 128) 5 % ophthalmic solution Place 1 drop into both eyes as needed for irritation.     SYRINGE-NEEDLE, DISP, 3 ML (BD ECLIPSE SYRINGE) 25G X 1 3 ML MISC Use sq q 2 wks 50 each 3   torsemide  (DEMADEX ) 20 MG tablet Take 1 tablet (20 mg total) by mouth daily as needed.     traZODone  (DESYREL ) 50 MG tablet TAKE 4 TABLETS(200 MG) BY MOUTH AT BEDTIME 360 tablet 3   traZODone  (DESYREL ) 50 MG tablet TAKE 4 TABLETS(200 MG) BY MOUTH AT BEDTIME 360 tablet 3   traZODone  (DESYREL ) 50 MG tablet TAKE 4 TABLETS(200 MG) BY MOUTH AT BEDTIME 360 tablet 3   verapamil  (CALAN -SR) 240 MG CR tablet TAKE 1 TABLET(240 MG) BY MOUTH AT BEDTIME 90 tablet 3   levothyroxine  (SYNTHROID ) 88 MCG tablet TAKE 1 TABLET(88 MCG) BY MOUTH DAILY 90 tablet 3   furosemide  (LASIX ) 40 MG tablet Take 40 mg by mouth daily. (Patient not taking: Reported on 02/16/2024)     No facility-administered medications prior to visit.    ROS: Review of Systems  Constitutional:  Positive for chills and fatigue. Negative for activity change, appetite change and unexpected weight change.  HENT:  Positive for sinus pressure and voice change. Negative for congestion and mouth sores.   Eyes:  Negative for visual disturbance.  Respiratory:  Positive for cough. Negative for chest tightness, shortness of breath and wheezing.   Gastrointestinal:  Negative for abdominal pain and nausea.  Genitourinary:  Negative for difficulty urinating, frequency and vaginal pain.  Musculoskeletal:  Positive for gait problem. Negative for back pain.  Skin:  Negative for pallor and rash.  Neurological:  Negative for dizziness, tremors, weakness, numbness and headaches.  Psychiatric/Behavioral:   Negative for confusion and sleep disturbance.     Objective:  BP 124/88   Pulse 73   Temp 98.3 F (36.8 C)   Ht 5' 2 (1.575 m)   Wt 151 lb 9.6 oz (68.8 kg)   SpO2 98%   BMI 27.73 kg/m   BP Readings from Last 3 Encounters:  02/16/24 124/88  01/19/24 (!) 144/82  01/12/24 115/70    Wt Readings from Last 3 Encounters:  02/16/24 151 lb 9.6 oz (68.8 kg)  01/19/24 147 lb (66.7 kg)  01/12/24 149 lb (67.6 kg)    Physical Exam Constitutional:      General: She is not in  acute distress.    Appearance: She is well-developed. She is not ill-appearing or toxic-appearing.  HENT:     Head: Normocephalic.     Right Ear: External ear normal.     Left Ear: External ear normal.     Nose: Congestion present.     Mouth/Throat:     Pharynx: Posterior oropharyngeal erythema present. No oropharyngeal exudate.  Eyes:     General:        Right eye: No discharge.        Left eye: No discharge.     Conjunctiva/sclera: Conjunctivae normal.     Pupils: Pupils are equal, round, and reactive to light.  Neck:     Thyroid : No thyromegaly.     Vascular: No JVD.     Trachea: No tracheal deviation.  Cardiovascular:     Rate and Rhythm: Normal rate and regular rhythm.     Heart sounds: Normal heart sounds.  Pulmonary:     Effort: No respiratory distress.     Breath sounds: No stridor. No wheezing.  Abdominal:     General: Bowel sounds are normal. There is no distension.     Palpations: Abdomen is soft. There is no mass.     Tenderness: There is no abdominal tenderness. There is no guarding or rebound.  Musculoskeletal:        General: No tenderness.     Cervical back: Normal range of motion and neck supple. No rigidity.  Lymphadenopathy:     Cervical: No cervical adenopathy.  Skin:    Findings: No erythema or rash.  Neurological:     Cranial Nerves: No cranial nerve deficit.     Motor: No abnormal muscle tone.     Coordination: Coordination normal.     Deep Tendon Reflexes: Reflexes  normal.  Psychiatric:        Behavior: Behavior normal.        Thought Content: Thought content normal.        Judgment: Judgment normal.   Non-toxic  Lab Results  Component Value Date   WBC 12.6 (H) 01/19/2024   HGB 14.2 01/19/2024   HCT 42.7 01/19/2024   PLT 388.0 01/19/2024   GLUCOSE 68 (L) 01/19/2024   CHOL 142 01/25/2023   TRIG 142.0 01/25/2023   HDL 48.40 01/25/2023   LDLDIRECT 165.0 09/26/2009   LDLCALC 65 01/25/2023   ALT 15 01/19/2024   AST 24 01/19/2024   NA 139 01/19/2024   K 4.4 01/19/2024   CL 92 (L) 01/19/2024   CREATININE 0.93 01/19/2024   BUN 11 01/19/2024   CO2 35 (H) 01/19/2024   TSH 1.29 12/05/2023   INR 1.0 12/18/2021   HGBA1C 6.0 07/05/2023    MM 3D SCREENING MAMMOGRAM BILATERAL BREAST Result Date: 05/10/2023 CLINICAL DATA:  Screening. EXAM: DIGITAL SCREENING BILATERAL MAMMOGRAM WITH TOMOSYNTHESIS AND CAD TECHNIQUE: Bilateral screening digital craniocaudal and mediolateral oblique mammograms were obtained. Bilateral screening digital breast tomosynthesis was performed. The images were evaluated with computer-aided detection. COMPARISON:  Previous exam(s). ACR Breast Density Category b: There are scattered areas of fibroglandular density. FINDINGS: There are no findings suspicious for malignancy. IMPRESSION: No mammographic evidence of malignancy. A result letter of this screening mammogram will be mailed directly to the patient. RECOMMENDATION: Screening mammogram in one year. (Code:SM-B-01Y) BI-RADS CATEGORY  1: Negative. Electronically Signed   By: Rosaline Collet M.D.   On: 05/10/2023 11:07    Assessment & Plan:   Problem List Items Addressed This Visit  Bronchiectasis (HCC) - Primary   Worse Depo-medrol  79 IM today Levaquin  po Using a vibrating vest and a flutter valve F/u w/Dr Hunsucker      Chronic obstructive airway disease with asthma (HCC)   Chronic - worse due to URI Pulmicort , Combivent  Prom-cod syr  Potential benefits of a  long term opioids use as well as potential risks (i.e. addiction risk, apnea etc) and complications (i.e. Somnolence, constipation and others) were explained to the patient and were aknowledged.       Cough   Acute on chronic Depomedrol IM Levaquin  po if worse          Meds ordered this encounter  Medications   levothyroxine  (SYNTHROID ) 88 MCG tablet    Sig: TAKE 1 TABLET(88 MCG) BY MOUTH DAILY    Dispense:  90 tablet    Refill:  3   levofloxacin  (LEVAQUIN ) 500 MG tablet    Sig: Take 1 tablet (500 mg total) by mouth daily.    Dispense:  10 tablet    Refill:  0    Hazelene can take Levaquin  ok      Follow-up: Return in about 3 months (around 05/16/2024) for a follow-up visit.  Marolyn Noel, MD

## 2024-02-16 NOTE — Assessment & Plan Note (Addendum)
 Acute on chronic Depomedrol IM Levaquin  po if worse

## 2024-02-16 NOTE — Assessment & Plan Note (Addendum)
 Worse Depo-medrol  80 IM today Levaquin  po Using a vibrating vest and a flutter valve F/u w/Dr [employer]

## 2024-02-16 NOTE — Addendum Note (Signed)
 Addended byBETHA LUCETTA CLEATRICE LELON on: 02/16/2024 12:04 PM   Modules accepted: Orders

## 2024-02-16 NOTE — Assessment & Plan Note (Signed)
 Chronic - worse due to URI Pulmicort , Combivent  Prom-cod syr  Potential benefits of a long term opioids use as well as potential risks (i.e. addiction risk, apnea etc) and complications (i.e. Somnolence, constipation and others) were explained to the patient and were aknowledged.

## 2024-02-27 ENCOUNTER — Ambulatory Visit: Payer: Medicare Other | Admitting: Psychology

## 2024-02-27 DIAGNOSIS — F331 Major depressive disorder, recurrent, moderate: Secondary | ICD-10-CM
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
## 2024-03-05 ENCOUNTER — Ambulatory Visit: Admitting: Psychology

## 2024-03-05 ENCOUNTER — Other Ambulatory Visit: Payer: Self-pay

## 2024-03-05 DIAGNOSIS — F331 Major depressive disorder, recurrent, moderate: Secondary | ICD-10-CM | POA: Diagnosis not present

## 2024-03-05 NOTE — Progress Notes (Signed)
 "  [facility] Counselor/Therapist Progress Note  Patient ID: Veronica [name], MRN: [id number],    Date: 03/05/2024  Time Spent: 12:00pm-12:45pm   45 minutes   Treatment Type: Individual Therapy  Reported Symptoms: stress, feeling down  Mental Status Exam: Appearance:  Casual     Behavior: Appropriate  Motor: Normal  Speech/Language:  Normal Rate  Affect: Appropriate  Mood: normal  Thought process: normal  Thought content:   WNL  Sensory/Perceptual disturbances:   WNL  Orientation: oriented to person, place, time/date, and situation  Attention: Good  Concentration: Good  Memory: WNL  Fund of knowledge:  Good  Insight:   Good  Judgment:  Good  Impulse Control: Good   Risk Assessment: Danger to Self:  No Self-injurious Behavior: No Danger to Others: No Duty to Warn:no Physical Aggression / Violence:No  Access to Firearms a concern: No  Gang Involvement:No   Subjective: Pt present for face-to-face individual therapy via video.  Pt consents to telehealth video session and is aware of limitations and benefits of virtual sessions.  Location of pt: home Location of therapist: home office.   Pt talked about her brother Bruce.  His biopsy came back and it is not cancer so pt is relieved.  Pt talked about Christmas plans.   She had a get together with family that was enjoyable.   Pt plans to go to a Christmas eve event as well.   She will be alone on Christmas day but will have phone calls with family.   Pt talked about the stress of her heating bill going up.   Pt talked about her health.  She has not been feeling well.  Pt is worried about her health.  Addressed pt's worries.  Pt has been putting pressure on herself for not getting things done.   Encouraged pt to prioritize pacing herself and resting and taking care of herself.  Pt talked about having some hard days when she thinks about life in general.     Helped pt process her thoughts and feelings.   Worked on  present moment mindfulness.  Worked on self care strategies.  Pt works on word search puzzles and tries to take one day at a time.   Provided supportive therapy.  Interventions: Cognitive Behavioral Therapy and Insight-Oriented  Diagnosis: F33.1   Plan of Care: Recommend ongoing therapy.   Pt participated in setting therapy goals.  Pt wants to have someone to talk to and improve coping skills.   Plan to continue to meet every two weeks.  Pt agrees with treatment plan.  Treatment Plan (Treatment Plan Target Date: 07/03/2024) Client Abilities/Strengths  Pt is bright, engaging, and motivated for therapy.   Client Treatment Preferences  Individual therapy.  Client Statement of Needs  Improve coping skills.  Symptoms  Depressed or irritable mood. Lack of energy. Low self-esteem. Unresolved grief issues.   Problems Addressed  Unipolar Depression Goals 1. Alleviate depressive symptoms and return to previous level of effective functioning. 2. Appropriately grieve the loss in order to normalize mood and to return to previously adaptive level of functioning. Objective Learn and implement behavioral strategies to overcome depression. Target Date: 2024-07-03 Frequency: Biweekly  Progress: 55 Modality: individual  Related Interventions Engage the client in behavioral activation, increasing his/her activity level and contact with sources of reward, while identifying processes that inhibit activation.  Use behavioral techniques such as instruction, rehearsal, role-playing, role reversal, as needed, to facilitate activity in the client's daily life; reinforce success.  Assist the client in developing skills that increase the likelihood of deriving pleasure from behavioral activation (e.g., assertiveness skills, developing an exercise plan, less internal/more external focus, increased social involvement); reinforce success. Objective Identify important people in life, past and present, and  describe the quality, good and poor, of those relationships. Target Date: 2024-07-03 Frequency: Biweekly  Progress: 55 Modality: individual  Related Interventions Conduct Interpersonal Therapy beginning with the assessment of the client's interpersonal inventory of important past and present relationships; develop a case formulation linking depression to grief, interpersonal role disputes, role transitions, and/or interpersonal deficits). Objective Learn and implement problem-solving and decision-making skills. Target Date: 2024-07-03 Frequency: Biweekly  Progress: 55 Modality: individual  Related Interventions Conduct Problem-Solving Therapy using techniques such as psychoeducation, modeling, and role-playing to teach client problem-solving skills (i.e., defining a problem specifically, generating possible solutions, evaluating the pros and cons of each solution, selecting and implementing a plan of action, evaluating the efficacy of the plan, accepting or revising the plan); role-play application of the problem-solving skill to a real life issue. Encourage in the client the development of a positive problem orientation in which problems and solving them are viewed as a natural part of life and not something to be feared, despaired, or avoided. 3. Develop healthy interpersonal relationships that lead to the alleviation and help prevent the relapse of depression. 4. Develop healthy thinking patterns and beliefs about self, others, and the world that lead to the alleviation and help prevent the relapse of depression. 5. Recognize, accept, and cope with feelings of depression. Diagnosis F33.1  Conditions For Discharge Achievement of treatment goals and objectives   [name], LCSW   "

## 2024-03-06 MED ORDER — ISOSORBIDE MONONITRATE ER 60 MG PO TB24: 60.0000 mg | ORAL_TABLET | Freq: Every day | ORAL | 0 refills | Status: AC

## 2024-03-12 ENCOUNTER — Ambulatory Visit: Payer: Medicare Other | Admitting: Psychology

## 2024-03-19 ENCOUNTER — Ambulatory Visit (INDEPENDENT_AMBULATORY_CARE_PROVIDER_SITE_OTHER): Admitting: Psychology

## 2024-03-19 DIAGNOSIS — F331 Major depressive disorder, recurrent, moderate: Secondary | ICD-10-CM | POA: Diagnosis not present
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
## 2024-03-20 ENCOUNTER — Ambulatory Visit (INDEPENDENT_AMBULATORY_CARE_PROVIDER_SITE_OTHER)

## 2024-03-20 VITALS — BP 120/80 | HR 72 | Ht 62.0 in | Wt 156.4 lb

## 2024-03-20 DIAGNOSIS — Z23 Encounter for immunization: Secondary | ICD-10-CM | POA: Diagnosis not present

## 2024-03-20 DIAGNOSIS — K635 Polyp of colon: Secondary | ICD-10-CM

## 2024-03-20 DIAGNOSIS — Z0000 Encounter for general adult medical examination without abnormal findings: Secondary | ICD-10-CM

## 2024-03-20 NOTE — Patient Instructions (Addendum)
 Ms. Steven,  Thank you for taking the time for your Medicare Wellness Visit. I appreciate your continued commitment to your health goals. Please review the care plan we discussed, and feel free to reach out if I can assist you further.  Please note that Annual Wellness Visits do not include a physical exam. Some assessments may be limited, especially if the visit was conducted virtually. If needed, we may recommend an in-person follow-up with your provider.  Ongoing Care Seeing your primary care provider every 3 to 6 months helps us  monitor your health and provide consistent, personalized care.   Referrals If a referral was made during today's visit and you haven't received any updates within two weeks, please contact the referred provider directly to check on the status.  Recommended Screenings:  Health Maintenance  Topic Date Due   Flu Shot  10/14/2023   COVID-19 Vaccine (4 - 2025-26 season) 11/14/2023   DTaP/Tdap/Td vaccine (2 - Tdap) 02/12/2024   Colon Cancer Screening  03/22/2024   Medicare Annual Wellness Visit  03/20/2025   Breast Cancer Screening  05/05/2025   Pneumococcal Vaccine for age over 25  Completed   Osteoporosis screening with Bone Density Scan  Completed   Hepatitis C Screening  Completed   Meningitis B Vaccine  Aged Out   Zoster (Shingles) Vaccine  Discontinued       04/19/2023    2:36 PM  Advanced Directives  Does Patient Have a Medical Advance Directive? Yes  Type of Advance Directive Healthcare Power of Attorney  Copy of Healthcare Power of Attorney in Chart? No - copy requested    Vision: Annual vision screenings are recommended for early detection of glaucoma, cataracts, and diabetic retinopathy. These exams can also reveal signs of chronic conditions such as diabetes and high blood pressure.  Dental: Annual dental screenings help detect early signs of oral cancer, gum disease, and other conditions linked to overall health, including heart disease and  diabetes.

## 2024-03-20 NOTE — Progress Notes (Addendum)
 "  Chief Complaint  Patient presents with   Medicare Wellness     Subjective:   Veronica [name] is a 74 y.o. female who presents for a Medicare Annual Wellness Visit.  Visit info / Clinical Intake: Medicare Wellness Visit Type:: Subsequent Annual Wellness Visit Persons participating in visit and providing information:: patient Medicare Wellness Visit Mode:: In-person (required for WTM) Interpreter Needed?: No Pre-visit prep was completed: yes AWV questionnaire completed by patient prior to visit?: no Living arrangements:: (!) lives alone Patient's Overall Health Status Rating: (!) fair Typical amount of pain: none Does pain affect daily life?: no Are you currently prescribed opioids?: no  Dietary Habits and Nutritional Risks How many meals a day?: 2 Eats fruit and vegetables daily?: yes Most meals are obtained by: preparing own meals; eating out In the last 2 weeks, have you had any of the following?: none Diabetic:: no  Functional Status Activities of Daily Living (to include ambulation/medication): Independent Ambulation: Independent with device- listed below Home Assistive Devices/Equipment: Johna Finder (specify Type); Eyeglasses; Dentures (specify type); Nebulizers Medication Administration: Independent Home Management (perform basic housework or laundry): Independent Manage your own finances?: yes Primary transportation is: driving Concerns about vision?: no *vision screening is required for WTM* Concerns about hearing?: no  Fall Screening Falls in the past year?: 1 Number of falls in past year: 0 (1) Was there an injury with Fall?: 1 (right hip pain no fracture) Fall Risk Category Calculator: 2 Patient Fall Risk Level: Moderate Fall Risk  Fall Risk Patient at Risk for Falls Due to: Impaired balance/gait Fall risk Follow up: Falls evaluation completed; Falls prevention discussed  Home and Transportation Safety: All rugs have non-skid backing?: N/A, no  rugs All stairs or steps have railings?: yes (outside) Grab bars in the bathtub or shower?: yes Have non-skid surface in bathtub or shower?: yes Good home lighting?: yes Regular seat belt use?: yes Hospital stays in the last year:: no  Cognitive Assessment Difficulty concentrating, remembering, or making decisions? : no Will 6CIT or Mini Cog be Completed: yes What year is it?: 0 points What month is it?: 0 points Give patient an address phrase to remember (5 components): [address] [place], [place] About what time is it?: 0 points Count backwards from 20 to 1: 0 points Say the months of the year in reverse: 0 points Repeat the address phrase from earlier: 0 points 6 CIT Score: 0 points  Advance Directives (For Healthcare) Does Patient Have a Medical Advance Directive?: Yes Does patient want to make changes to medical advance directive?: Yes (Inpatient - patient requests chaplain consult to change a medical advance directive) Type of Advance Directive: Healthcare Power of Ages; Living will Copy of Healthcare Power of Attorney in Chart?: No - copy requested Copy of Living Will in Chart?: No - copy requested  Reviewed/Updated  Reviewed/Updated: Reviewed All (Medical, Surgical, Family, Medications, Allergies, Care Teams, Patient Goals)    Allergies (verified) Avelox [moxifloxacin hcl in nacl], Cephalexin, Clindamycin hcl, Moxifloxacin, Amantadine hcl, Bee venom, Cymbalta  [duloxetine  hcl], Cyproheptadine hcl, Doxycycline hyclate, Effexor [venlafaxine hydrochloride], Effexor [venlafaxine], Fluticasone-salmeterol, Guaifenesin, Imipramine hcl, Lactose intolerance (gi), Latex, Other, Oxycodone-acetaminophen , Phenytoin , Pirbuterol acetate, Remeron [mirtazapine], Salmeterol xinafoate, Viibryd  [vilazodone  hcl], Zolpidem  tartrate, Azithromycin, Ciprofloxacin , Contrast media  [iodinated contrast media], Erythromycin base, Flagyl [metronidazole hcl], Fluconazole, Fluoxetine, Lansoprazole,  Metoclopramide hcl, Montelukast sodium, Penicillins, Propulsid [cisapride], Reglan [metoclopramide], Sulfadiazine, Sulfamethoxazole-trimethoprim, Telithromycin, Tetracycline hcl, Topiramate, and Valproic acid   Current Medications (verified) Outpatient Encounter Medications as of 03/20/2024  Medication  Sig   acetaminophen -codeine  (TYLENOL  #3) 300-30 MG tablet TAKE 1 TABLET BY MOUTH EVERY 6 HOURS AS NEEDED   albuterol  (PROVENTIL ) (2.5 MG/3ML) 0.083% nebulizer solution USE 1 VIAL IN NEBULIZER 2 TIMES DAILY. Generic: VENTOLIN    albuterol  (VENTOLIN  HFA) 108 (90 Base) MCG/ACT inhaler INHALE 2 PUFFS INTO THE LUNGS EVERY 4 HOURS AS NEEDED FOR WHEEZING OR SHORTNESS OF BREATH   arformoterol  (BROVANA ) 15 MCG/2ML NEBU Take 2 mLs (15 mcg total) by nebulization 2 (two) times daily.   aspirin 81 MG chewable tablet Chew by mouth daily.   atorvastatin  (LIPITOR) 40 MG tablet TAKE 1 TABLET BY MOUTH EVERY DAY   budesonide  (PULMICORT ) 0.5 MG/2ML nebulizer solution Take 2 mLs (0.5 mg total) by nebulization 2 (two) times daily.   calcium  carbonate (OSCAL) 1500 (600 Ca) MG TABS tablet Take 600 mg of elemental calcium  by mouth 2 (two) times daily with a meal.    cariprazine  (VRAYLAR ) 1.5 MG capsule Take 1 capsule (1.5 mg total) by mouth daily. [employer] @ [phone] Lot # [id number] Exp- [id number]   Cholecalciferol (VITAMIN D3) 50 MCG (2000 UT) capsule Take 1 capsule (2,000 Units total) by mouth daily.   clonazePAM  (KLONOPIN ) 1 MG tablet TAKE 2 TABLETS BY MOUTH EVERY NIGHT AT BEDTIME AND 1/2 TABLET EXTRA IN DAYTIME AS NEEDED   cyanocobalamin  (VITAMIN B12) 1000 MCG/ML injection INJECT 1 ML IN THE MUSCLE EVERY 14 DAYS   diphenoxylate -atropine  (LOMOTIL ) 2.5-0.025 MG tablet Take 1 tablet by mouth 4 (four) times daily as needed for diarrhea or loose stools.   Docusate Calcium  (STOOL SOFTENER PO) Take 100 mg by mouth as needed (for constipation).   escitalopram  (LEXAPRO ) 20 MG tablet TAKE 1 TABLET(20 MG) BY MOUTH DAILY    ezetimibe  (ZETIA ) 10 MG tablet Take 1 tablet (10 mg total) by mouth daily.   Insulin  Syringe-Needle U-100 (B-D INSULIN  SYRINGE 1CC/25GX1) 25G X 1 1 ML MISC USE AS DIRECTED EVERY 2 WEEKS   isosorbide  mononitrate (IMDUR ) 60 MG 24 hr tablet Take 1 tablet (60 mg total) by mouth daily.   ketoconazole  (NIZORAL ) 2 % cream Apply 1 Application topically 2 (two) times daily.   levofloxacin  (LEVAQUIN ) 500 MG tablet Take 1 tablet (500 mg total) by mouth daily.   levothyroxine  (SYNTHROID ) 88 MCG tablet TAKE 1 TABLET(88 MCG) BY MOUTH DAILY   metFORMIN  (GLUCOPHAGE ) 500 MG tablet TAKE 1 TABLET(500 MG) BY MOUTH DAILY WITH BREAKFAST   metoprolol  tartrate (LOPRESSOR ) 50 MG tablet TAKE 1 TABLET 2 HOURS PRIOR TO TEST   Multiple Vitamins-Minerals (PRESERVISION AREDS 2 PO) Take by mouth.   nitroGLYCERIN  (NITROSTAT ) 0.4 MG SL tablet DISSOLVE 1 TABLET UNDER THE TONGUE AS DIRECTED. MAX 3 DOSES. CALL 911 IF NEEDING ALL 3 DOSESDISSOLVE 1 TABLET UNDER THE TONGUE AS DIRECTED. MAX 3 DOSES. CALL 911 IF NEEDING ALL 3 DOSES   nystatin  (MYCOSTATIN ) 100000 UNIT/ML suspension Take 5 mLs (500,000 Units total) by mouth 4 (four) times daily.   Omega-3 Fatty Acids (FISH OIL) 1000 MG CAPS Take 1 capsule by mouth daily.   ondansetron  (ZOFRAN ) 4 MG tablet Take 1 tablet (4 mg total) by mouth every 8 (eight) hours as needed for nausea or vomiting.   phenytoin  (DILANTIN ) 100 MG ER capsule TAKE 2 CAPSULES BY MOUTH EVERY MORNING AND 1 CAPSULE BY MOUTH EVERY EVENING   phenytoin  (DILANTIN ) 100 MG ER capsule TAKE 2 CAPSULES BY MOUTH EVERY MORNING AND 1 CAPSULE EVERY EVENING   potassium chloride  SA (KLOR-CON  M) 20 MEQ tablet Take 1 tablet (20 mEq total)  by mouth 2 (two) times daily.   promethazine  (PHENERGAN ) 12.5 MG tablet TAKE 1 TABLET(12.5 MG) BY MOUTH EVERY 4 HOURS AS NEEDED FOR NAUSEA OR VOMITING   promethazine -dextromethorphan (PROMETHAZINE -DM) 6.25-15 MG/5ML syrup TAKE 5 ML BY MOUTH FOUR TIMES DAILY AS NEEDED FOR COUGH   Respiratory Therapy  Supplies (FLUTTER) DEVI Use after nebulization treatments   sodium chloride  (MURO 128) 5 % ophthalmic solution Place 1 drop into both eyes as needed for irritation.   SYRINGE-NEEDLE, DISP, 3 ML (BD ECLIPSE SYRINGE) 25G X 1 3 ML MISC Use sq q 2 wks   torsemide  (DEMADEX ) 20 MG tablet Take 1 tablet (20 mg total) by mouth daily as needed.   traZODone  (DESYREL ) 50 MG tablet TAKE 4 TABLETS(200 MG) BY MOUTH AT BEDTIME   traZODone  (DESYREL ) 50 MG tablet TAKE 4 TABLETS(200 MG) BY MOUTH AT BEDTIME   traZODone  (DESYREL ) 50 MG tablet TAKE 4 TABLETS(200 MG) BY MOUTH AT BEDTIME   verapamil  (CALAN -SR) 240 MG CR tablet TAKE 1 TABLET(240 MG) BY MOUTH AT BEDTIME   furosemide  (LASIX ) 40 MG tablet Take 40 mg by mouth daily. (Patient not taking: Reported on 03/20/2024)   No facility-administered encounter medications on file as of 03/20/2024.    History: Past Medical History:  Diagnosis Date   Adenomatous colon polyp    Asthma    AVM (arteriovenous malformation)    Bronchitis, chronic (HCC)    CAD (coronary artery disease)    Colon polyp 01/04/1991   hyperplastic   COPD (chronic obstructive pulmonary disease) (HCC)    CVA (cerebral infarction)    following brain surgery   Depression    Diverticulosis of colon 02/17/2006   Endometriosis    Gastric ulcer    GERD (gastroesophageal reflux disease)    Hepatic cyst    History of colonic polyps    Hyperlipidemia    Hypertension    LBP (low back pain)    Migraine headache    OA (osteoarthritis)    PONV (postoperative nausea and vomiting)    slight nausea   Seizure disorder (HCC)    Seizures (HCC)    Shortness of breath dyspnea    Sjogren's disease 2010   per Dr. Delores, DDS   Stroke [facility])    right side weakness   Thyroid  nodule    Vitamin B 12 deficiency    Vitamin D  deficiency    Past Surgical History:  Procedure Laterality Date   BRAIN SURGERY  1991   CATARACT EXTRACTION W/ INTRAOCULAR LENS  IMPLANT, BILATERAL     CHOLECYSTECTOMY      COLONOSCOPY     ESOPHAGOGASTRODUODENOSCOPY     x4   HEMORRHOIDECTOMY WITH HEMORRHOID BANDING     LAPAROSCOPIC NISSEN FUNDOPLICATION     LAPAROSCOPIC OVARIAN CYSTECTOMY     MOUTH SURGERY     NISSEN FUNDOPLICATION  1999   TEMPOROMANDIBULAR JOINT SURGERY  02/2001   THYROIDECTOMY N/A 08/29/2015   Procedure:  TOTAL THYROIDECTOMY;  Surgeon: Krystal Spinner, MD;  Location: [facility] OR;  Service: General;  Laterality: N/A;   TOTAL THYROIDECTOMY  08/29/2015   Family History  Problem Relation Age of Onset   Hypertension Mother    Heart attack Mother    Arthritis Mother    Heart disease Father    Pancreatic cancer Father    Colon cancer Sister 20   Bone cancer Sister    Liver cancer Sister    Hypertension Other    Diabetes Other    Diabetes Brother    Asthma  Brother    Emphysema Maternal Aunt        x2   Rheumatologic disease Maternal Grandfather    Esophageal cancer Neg Hx    Stomach cancer Neg Hx    Rectal cancer Neg Hx    Social History   Occupational History   Occupation: disabled    Employer: DISABLED  Tobacco Use   Smoking status: Never    Passive exposure: Past   Smokeless tobacco: Never   Tobacco comments:    Father & mutiple other family members smoked.  Vaping Use   Vaping status: Never Used  Substance and Sexual Activity   Alcohol use: No   Drug use: No   Sexual activity: Not Currently   Tobacco Counseling Counseling given: No Tobacco comments: Father & mutiple other family members smoked.  SDOH Screenings   Food Insecurity: No Food Insecurity (03/20/2024)  Housing: Unknown (03/20/2024)  Transportation Needs: No Transportation Needs (03/20/2024)  Utilities: Not At Risk (03/20/2024)  Alcohol Screen: Low Risk (04/19/2023)  Depression (PHQ2-9): Low Risk (03/20/2024)  Financial Resource Strain: Low Risk (04/19/2023)  Physical Activity: Insufficiently Active (03/20/2024)  Social Connections: Socially Isolated (03/20/2024)  Stress: No Stress Concern Present (03/20/2024)  Tobacco Use:  Low Risk (03/20/2024)  Health Literacy: Adequate Health Literacy (03/20/2024)   See flowsheets for full screening details  Depression Screen PHQ 2 & 9 Depression Scale- Over the past 2 weeks, how often have you been bothered by any of the following problems? Little interest or pleasure in doing things: 0 Feeling down, depressed, or hopeless (PHQ Adolescent also includes...irritable): 0 PHQ-2 Total Score: 0 Trouble falling or staying asleep, or sleeping too much: 0 Feeling tired or having little energy: 0 Poor appetite or overeating (PHQ Adolescent also includes...weight loss): 0 Feeling bad about yourself - or that you are a failure or have let yourself or your family down: 0 Trouble concentrating on things, such as reading the newspaper or watching television (PHQ Adolescent also includes...like school work): 0 Moving or speaking so slowly that other people could have noticed. Or the opposite - being so fidgety or restless that you have been moving around a lot more than usual: 3 (uses a cane/walker) Thoughts that you would be better off dead, or of hurting yourself in some way: 0 PHQ-9 Total Score: 3 If you checked off any problems, how difficult have these problems made it for you to do your work, take care of things at home, or get along with other people?: Not difficult at all  Depression Treatment Depression Interventions/Treatment : EYV7-0 Score <4 Follow-up Not Indicated; Medication; Currently on Treatment     Goals Addressed               This Visit's Progress     Patient Stated (pt-stated)        Patient stated she plans to continue being active and manage her health/meds and looking forward to her [birth year] on [birth year]             Objective:    Today's Vitals   03/20/24 1026  BP: 120/80  Pulse: 72  SpO2: 94%  Weight: 156 lb 6.4 oz (70.9 kg)  Height: 5' 2 (1.575 m)   Body mass index is 28.61 kg/m.  Hearing/Vision screen Hearing Screening - Comments:: Denies  hearing difficulties   Vision Screening - Comments:: Wears rx glasses - up to date with routine eye exams with [facility] Immunizations and Health Maintenance Health Maintenance  Topic Date Due  COVID-19 Vaccine (4 - 2025-26 season) 11/14/2023   DTaP/Tdap/Td (2 - Tdap) 02/12/2024   Colonoscopy  03/22/2024   Medicare Annual Wellness (AWV)  03/20/2025   Mammogram  05/05/2025   Pneumococcal Vaccine: 50+ Years  Completed   Influenza Vaccine  Completed   Bone Density Scan  Completed   Hepatitis C Screening  Completed   Meningococcal B Vaccine  Aged Out   Zoster Vaccines- Shingrix  Discontinued        Assessment/Plan:  This is a routine wellness examination for Sharlena.  Colonoscopy status: Referral to Dr Albertus of  GI for a repeat Colonoscopy   Patient Care Team: Plotnikov, Karlynn GAILS, MD as PCP - General Croitoru, Jerel, MD as PCP - Cardiology (Cardiology) Croitoru, Jerel, MD as Consulting Physician (Cardiology) Bauert, Veva ORN, LCSW as Social Worker (Licensed Clinical Social Worker) Alaine Vicenta NOVAK, MD as Consulting Physician (Pulmonary Disease) Pyrtle, Gordy HERO, MD as Consulting Physician (Gastroenterology) Leslee Reusing, MD as Consulting Physician (Ophthalmology) Hunsucker, Donnice SAUNDERS, MD as Consulting Physician (Pulmonary Disease)  I have personally reviewed and noted the following in the patients chart:   Medical and social history Use of alcohol, tobacco or illicit drugs  Current medications and supplements including opioid prescriptions. Functional ability and status Nutritional status Physical activity Advanced directives List of other physicians Hospitalizations, surgeries, and ER visits in previous 12 months Vitals Screenings to include cognitive, depression, and falls Referrals and appointments  Orders Placed This Encounter  Procedures   Flu vaccine HIGH DOSE PF(Fluzone Trivalent)   Ambulatory referral to Gastroenterology    Referral  Priority:   Routine    Referral Type:   Consultation    Referral Reason:   Specialty Services Required    Referred to Provider:   Albertus Gordy HERO, MD    Number of Visits Requested:   1   In addition, I have reviewed and discussed with patient certain preventive protocols, quality metrics, and best practice recommendations. A written personalized care plan for preventive services as well as general preventive health recommendations were provided to patient.   Verdie HERO Saba, CMA   03/20/2024   Return in 1 year (on 03/20/2025).  After Visit Summary: (In Person-Declined) Patient declined AVS at this time.  Nurse Notes: Appointment(s) made: (scheduled 2027 AWV appt)  "

## 2024-03-26 ENCOUNTER — Ambulatory Visit: Admitting: Psychology

## 2024-03-26 DIAGNOSIS — F331 Major depressive disorder, recurrent, moderate: Secondary | ICD-10-CM | POA: Diagnosis not present

## 2024-03-26 NOTE — Progress Notes (Signed)
 "  [facility] Counselor/Therapist Progress Note  Patient ID: Veronica [name], MRN: [id number],    Date: 03/26/2024  Time Spent: 12:00pm-12:45pm   45 minutes   Treatment Type: Individual Therapy  Reported Symptoms: stress, feeling down  Mental Status Exam: Appearance:  Casual     Behavior: Appropriate  Motor: Normal  Speech/Language:  Normal Rate  Affect: Appropriate  Mood: normal  Thought process: normal  Thought content:   WNL  Sensory/Perceptual disturbances:   WNL  Orientation: oriented to person, place, time/date, and situation  Attention: Good  Concentration: Good  Memory: WNL  Fund of knowledge:  Good  Insight:   Good  Judgment:  Good  Impulse Control: Good   Risk Assessment: Danger to Self:  No Self-injurious Behavior: No Danger to Others: No Duty to Warn:no Physical Aggression / Violence:No  Access to Firearms a concern: No  Gang Involvement:No   Subjective: Pt present for face-to-face individual therapy via video.  Pt consents to telehealth video session and is aware of limitations and benefits of virtual sessions.  Location of pt: home Location of therapist: home office.   Pt talked about her birthday.  Pt received gifts from the [employer] staff for her birthday.  The staff at pt's bank gave pt a key lime pie cheese cake.  Pt felt good about being acknowledged on her birthday.   Pt talked about her neighbor.  The neighbor wants to get a trail camera and wants pt to pay for part of it.  Pt does not want to pay for it and can't afford it.  The neighbor is pressuring pt so pt is trying to distance herself from her.  Helped pt with healthy boundary setting.  Pt talked about her health.  She has not been feeling well.  Pt is worried about her health.  Addressed pt's worries.  Pt has been putting pressure on herself for not getting things done.   Encouraged pt to prioritize pacing herself and resting and taking care of herself.  Worked on self care  strategies.  Pt works on word search puzzles and tries to take one day at a time.   Provided supportive therapy.  Interventions: Cognitive Behavioral Therapy and Insight-Oriented  Diagnosis: F33.1   Plan of Care: Recommend ongoing therapy.   Pt participated in setting therapy goals.  Pt wants to have someone to talk to and improve coping skills.   Plan to continue to meet every two weeks.  Pt agrees with treatment plan.  Treatment Plan (Treatment Plan Target Date: 07/03/2024) Client Abilities/Strengths  Pt is bright, engaging, and motivated for therapy.   Client Treatment Preferences  Individual therapy.  Client Statement of Needs  Improve coping skills.  Symptoms  Depressed or irritable mood. Lack of energy. Low self-esteem. Unresolved grief issues.   Problems Addressed  Unipolar Depression Goals 1. Alleviate depressive symptoms and return to previous level of effective functioning. 2. Appropriately grieve the loss in order to normalize mood and to return to previously adaptive level of functioning. Objective Learn and implement behavioral strategies to overcome depression. Target Date: 2024-07-03 Frequency: Biweekly  Progress: 55 Modality: individual  Related Interventions Engage the client in behavioral activation, increasing his/her activity level and contact with sources of reward, while identifying processes that inhibit activation.  Use behavioral techniques such as instruction, rehearsal, role-playing, role reversal, as needed, to facilitate activity in the client's daily life; reinforce success. Assist the client in developing skills that increase the likelihood of deriving pleasure from  behavioral activation (e.g., assertiveness skills, developing an exercise plan, less internal/more external focus, increased social involvement); reinforce success. Objective Identify important people in life, past and present, and describe the quality, good and poor, of those  relationships. Target Date: 2024-07-03 Frequency: Biweekly  Progress: 55 Modality: individual  Related Interventions Conduct Interpersonal Therapy beginning with the assessment of the client's interpersonal inventory of important past and present relationships; develop a case formulation linking depression to grief, interpersonal role disputes, role transitions, and/or interpersonal deficits). Objective Learn and implement problem-solving and decision-making skills. Target Date: 2024-07-03 Frequency: Biweekly  Progress: 55 Modality: individual  Related Interventions Conduct Problem-Solving Therapy using techniques such as psychoeducation, modeling, and role-playing to teach client problem-solving skills (i.e., defining a problem specifically, generating possible solutions, evaluating the pros and cons of each solution, selecting and implementing a plan of action, evaluating the efficacy of the plan, accepting or revising the plan); role-play application of the problem-solving skill to a real life issue. Encourage in the client the development of a positive problem orientation in which problems and solving them are viewed as a natural part of life and not something to be feared, despaired, or avoided. 3. Develop healthy interpersonal relationships that lead to the alleviation and help prevent the relapse of depression. 4. Develop healthy thinking patterns and beliefs about self, others, and the world that lead to the alleviation and help prevent the relapse of depression. 5. Recognize, accept, and cope with feelings of depression. Diagnosis F33.1  Conditions For Discharge Achievement of treatment goals and objectives   [name], LCSW    "

## 2024-04-09 ENCOUNTER — Ambulatory Visit (INDEPENDENT_AMBULATORY_CARE_PROVIDER_SITE_OTHER): Admitting: Psychology

## 2024-04-09 DIAGNOSIS — F331 Major depressive disorder, recurrent, moderate: Secondary | ICD-10-CM | POA: Diagnosis not present
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
## 2024-04-10 ENCOUNTER — Ambulatory Visit: Admitting: Pulmonary Disease

## 2024-04-12 ENCOUNTER — Other Ambulatory Visit: Payer: Self-pay | Admitting: Cardiovascular Disease

## 2024-04-12 DIAGNOSIS — E7800 Pure hypercholesterolemia, unspecified: Secondary | ICD-10-CM

## 2024-04-16 NOTE — Telephone Encounter (Signed)
" °*  STAT* If patient is at the pharmacy, call can be transferred to refill team.   1. Which medications need to be refilled? (please list name of each medication and dose if known) ezetimibe  (ZETIA ) 10 MG tablet    2. Would you like to learn more about the convenience, safety, & potential cost savings by using the [facility]? No   3. Are you open to using the [facility] (Type [facility]. ). No    4. Which pharmacy/location (including street and city if local pharmacy) is medication to be sent to? [id number] - [place], [place] - [address]     5. Do they need a 30 day or 90 day supply? 90  "

## 2024-04-17 ENCOUNTER — Ambulatory Visit: Admitting: Cardiovascular Disease

## 2024-04-17 ENCOUNTER — Telehealth: Payer: Self-pay

## 2024-04-17 ENCOUNTER — Telehealth: Payer: Self-pay | Admitting: Cardiovascular Disease

## 2024-04-17 DIAGNOSIS — F329 Major depressive disorder, single episode, unspecified: Secondary | ICD-10-CM

## 2024-04-17 DIAGNOSIS — F332 Major depressive disorder, recurrent severe without psychotic features: Secondary | ICD-10-CM

## 2024-04-17 DIAGNOSIS — E7800 Pure hypercholesterolemia, unspecified: Secondary | ICD-10-CM

## 2024-04-17 MED ORDER — EZETIMIBE 10 MG PO TABS: 10.0000 mg | ORAL_TABLET | Freq: Every day | ORAL | 1 refills | Status: AC

## 2024-04-17 NOTE — Telephone Encounter (Signed)
" °*  STAT* If patient is at the pharmacy, call can be transferred to refill team.   1. Which medications need to be refilled? (please list name of each medication and dose if known)   ezetimibe  (ZETIA ) 10 MG tablet   2. Would you like to learn more about the convenience, safety, & potential cost savings by using the [facility]? No    3. Are you open to using the [facility] (Type [facility]. No    4. Which pharmacy/location (including street and city if local pharmacy) is medication to be sent to?  [id number] - [place], [place] - [address]     5. Do they need a 30 day or 90 day supply? 90 day    Pt is completely out.   "

## 2024-04-17 NOTE — Telephone Encounter (Signed)
 Labs can be Completed at next O/V

## 2024-04-17 NOTE — Telephone Encounter (Signed)
 Refill has already been sent today.

## 2024-04-18 NOTE — Telephone Encounter (Signed)
 Please send in a new rx for pt...  Reason for CRM: Pt is calling in regards to her cariprazine  (VRAYLAR ) 1.5 MG capsule to [facility].

## 2024-04-19 MED ORDER — CARIPRAZINE HCL 1.5 MG PO CAPS: 1.5000 mg | ORAL_CAPSULE | Freq: Every day | ORAL | 3 refills | Status: AC

## 2024-04-19 NOTE — Addendum Note (Signed)
 Addended by: Rashada Klontz V on: 04/19/2024 07:39 AM   Modules accepted: Orders

## 2024-04-19 NOTE — Telephone Encounter (Signed)
Okay.  Please fax.  Thanks

## 2024-04-23 ENCOUNTER — Ambulatory Visit: Admitting: Psychology

## 2024-05-07 ENCOUNTER — Ambulatory Visit: Admitting: Psychology

## 2024-05-16 ENCOUNTER — Ambulatory Visit: Admitting: Pulmonary Disease

## 2024-05-17 ENCOUNTER — Ambulatory Visit: Admitting: Internal Medicine

## 2024-05-21 ENCOUNTER — Ambulatory Visit: Admitting: Psychology

## 2024-06-04 ENCOUNTER — Ambulatory Visit: Admitting: Psychology

## 2024-08-31 ENCOUNTER — Ambulatory Visit: Admitting: Cardiovascular Disease

## 2025-03-21 ENCOUNTER — Ambulatory Visit
